# Patient Record
Sex: Male | Born: 1940 | Race: White | Hispanic: No | Marital: Married | Smoking: Never smoker
Health system: Southern US, Community
[De-identification: ages and names within clinical notes are randomized; demographics above are authoritative.]

## PROBLEM LIST (undated history)

## (undated) DIAGNOSIS — T148XXA Other injury of unspecified body region, initial encounter: Secondary | ICD-10-CM

## (undated) DIAGNOSIS — R351 Nocturia: Secondary | ICD-10-CM

## (undated) DIAGNOSIS — I4819 Other persistent atrial fibrillation: Secondary | ICD-10-CM

## (undated) DIAGNOSIS — I5032 Chronic diastolic (congestive) heart failure: Secondary | ICD-10-CM

## (undated) DIAGNOSIS — Z85831 Personal history of malignant neoplasm of soft tissue: Secondary | ICD-10-CM

## (undated) DIAGNOSIS — F32A Depression, unspecified: Secondary | ICD-10-CM

## (undated) DIAGNOSIS — E291 Testicular hypofunction: Secondary | ICD-10-CM

## (undated) DIAGNOSIS — D649 Anemia, unspecified: Secondary | ICD-10-CM

## (undated) DIAGNOSIS — G4733 Obstructive sleep apnea (adult) (pediatric): Secondary | ICD-10-CM

## (undated) DIAGNOSIS — Z8711 Personal history of peptic ulcer disease: Secondary | ICD-10-CM

## (undated) DIAGNOSIS — Z9989 Dependence on other enabling machines and devices: Secondary | ICD-10-CM

## (undated) DIAGNOSIS — M1990 Unspecified osteoarthritis, unspecified site: Secondary | ICD-10-CM

## (undated) DIAGNOSIS — Z923 Personal history of irradiation: Secondary | ICD-10-CM

## (undated) DIAGNOSIS — I4510 Unspecified right bundle-branch block: Secondary | ICD-10-CM

## (undated) DIAGNOSIS — R29898 Other symptoms and signs involving the musculoskeletal system: Secondary | ICD-10-CM

## (undated) DIAGNOSIS — F329 Major depressive disorder, single episode, unspecified: Secondary | ICD-10-CM

## (undated) DIAGNOSIS — Z9884 Bariatric surgery status: Secondary | ICD-10-CM

## (undated) DIAGNOSIS — I4892 Unspecified atrial flutter: Secondary | ICD-10-CM

## (undated) DIAGNOSIS — I10 Essential (primary) hypertension: Secondary | ICD-10-CM

## (undated) DIAGNOSIS — C61 Malignant neoplasm of prostate: Secondary | ICD-10-CM

## (undated) DIAGNOSIS — C499 Malignant neoplasm of connective and soft tissue, unspecified: Secondary | ICD-10-CM

## (undated) DIAGNOSIS — I509 Heart failure, unspecified: Secondary | ICD-10-CM

## (undated) DIAGNOSIS — I493 Ventricular premature depolarization: Secondary | ICD-10-CM

## (undated) DIAGNOSIS — Z9289 Personal history of other medical treatment: Secondary | ICD-10-CM

## (undated) DIAGNOSIS — R0600 Dyspnea, unspecified: Secondary | ICD-10-CM

## (undated) DIAGNOSIS — Z973 Presence of spectacles and contact lenses: Secondary | ICD-10-CM

## (undated) HISTORY — PX: CARDIAC CATHETERIZATION: SHX172

## (undated) HISTORY — PX: PATELLA FRACTURE SURGERY: SHX735

## (undated) HISTORY — DX: Malignant neoplasm of prostate: C61

## (undated) HISTORY — PX: FRACTURE SURGERY: SHX138

## (undated) HISTORY — DX: Personal history of other medical treatment: Z92.89

## (undated) HISTORY — PX: INGUINAL HERNIA REPAIR: SUR1180

## (undated) HISTORY — DX: Other persistent atrial fibrillation: I48.19

## (undated) HISTORY — DX: Unspecified atrial flutter: I48.92

## (undated) HISTORY — DX: Ventricular premature depolarization: I49.3

## (undated) HISTORY — PX: TONSILLECTOMY: SUR1361

## (undated) HISTORY — PX: JOINT REPLACEMENT: SHX530

## (undated) HISTORY — PX: GROIN DISSECTION: SUR421

---

## 1974-08-16 HISTORY — PX: INTESTINAL BYPASS: SHX1099

## 1975-08-17 HISTORY — PX: VENTRAL HERNIA REPAIR: SHX424

## 1984-08-16 HISTORY — PX: FOREARM FRACTURE SURGERY: SHX649

## 1991-08-17 DIAGNOSIS — Z923 Personal history of irradiation: Secondary | ICD-10-CM

## 1991-08-17 HISTORY — DX: Personal history of irradiation: Z92.3

## 1999-05-13 ENCOUNTER — Encounter: Payer: Self-pay | Admitting: Gastroenterology

## 1999-05-13 ENCOUNTER — Ambulatory Visit (HOSPITAL_COMMUNITY): Admission: RE | Admit: 1999-05-13 | Discharge: 1999-05-13 | Payer: Self-pay | Admitting: Gastroenterology

## 1999-05-25 ENCOUNTER — Ambulatory Visit (HOSPITAL_COMMUNITY): Admission: RE | Admit: 1999-05-25 | Discharge: 1999-05-25 | Payer: Self-pay | Admitting: Gastroenterology

## 1999-05-25 ENCOUNTER — Encounter: Payer: Self-pay | Admitting: Gastroenterology

## 1999-12-23 ENCOUNTER — Emergency Department (HOSPITAL_COMMUNITY): Admission: EM | Admit: 1999-12-23 | Discharge: 1999-12-24 | Payer: Self-pay | Admitting: Emergency Medicine

## 1999-12-24 ENCOUNTER — Encounter: Payer: Self-pay | Admitting: Emergency Medicine

## 2000-03-17 ENCOUNTER — Encounter: Payer: Self-pay | Admitting: *Deleted

## 2000-03-17 ENCOUNTER — Encounter: Admission: RE | Admit: 2000-03-17 | Discharge: 2000-03-17 | Payer: Self-pay

## 2000-05-27 ENCOUNTER — Encounter: Admission: RE | Admit: 2000-05-27 | Discharge: 2000-05-27 | Payer: Self-pay | Admitting: Gastroenterology

## 2000-05-27 ENCOUNTER — Encounter: Payer: Self-pay | Admitting: Gastroenterology

## 2001-01-26 ENCOUNTER — Encounter: Payer: Self-pay | Admitting: Internal Medicine

## 2001-01-26 ENCOUNTER — Encounter: Admission: RE | Admit: 2001-01-26 | Discharge: 2001-01-26 | Payer: Self-pay | Admitting: Internal Medicine

## 2001-02-07 ENCOUNTER — Encounter: Admission: RE | Admit: 2001-02-07 | Discharge: 2001-02-07 | Payer: Self-pay | Admitting: Internal Medicine

## 2001-02-07 ENCOUNTER — Encounter: Payer: Self-pay | Admitting: Internal Medicine

## 2001-05-10 ENCOUNTER — Emergency Department (HOSPITAL_COMMUNITY): Admission: EM | Admit: 2001-05-10 | Discharge: 2001-05-10 | Payer: Self-pay | Admitting: Emergency Medicine

## 2001-05-10 ENCOUNTER — Encounter: Payer: Self-pay | Admitting: Emergency Medicine

## 2001-07-14 ENCOUNTER — Encounter: Admission: RE | Admit: 2001-07-14 | Discharge: 2001-07-14 | Payer: Self-pay | Admitting: General Surgery

## 2001-07-14 ENCOUNTER — Encounter: Payer: Self-pay | Admitting: General Surgery

## 2001-08-14 ENCOUNTER — Encounter: Payer: Self-pay | Admitting: General Surgery

## 2001-08-14 ENCOUNTER — Ambulatory Visit (HOSPITAL_COMMUNITY): Admission: RE | Admit: 2001-08-14 | Discharge: 2001-08-14 | Payer: Self-pay | Admitting: General Surgery

## 2002-07-02 ENCOUNTER — Ambulatory Visit (HOSPITAL_COMMUNITY): Admission: RE | Admit: 2002-07-02 | Discharge: 2002-07-02 | Payer: Self-pay | Admitting: Gastroenterology

## 2002-07-03 ENCOUNTER — Ambulatory Visit (HOSPITAL_COMMUNITY): Admission: RE | Admit: 2002-07-03 | Discharge: 2002-07-03 | Payer: Self-pay | Admitting: Gastroenterology

## 2002-07-03 HISTORY — PX: COLONOSCOPY: SHX174

## 2002-09-24 ENCOUNTER — Encounter: Payer: Self-pay | Admitting: Internal Medicine

## 2002-09-24 ENCOUNTER — Encounter: Admission: RE | Admit: 2002-09-24 | Discharge: 2002-09-24 | Payer: Self-pay | Admitting: Internal Medicine

## 2002-09-25 ENCOUNTER — Encounter: Payer: Self-pay | Admitting: Internal Medicine

## 2002-09-25 ENCOUNTER — Encounter: Admission: RE | Admit: 2002-09-25 | Discharge: 2002-09-25 | Payer: Self-pay | Admitting: Internal Medicine

## 2003-04-20 ENCOUNTER — Encounter: Payer: Self-pay | Admitting: Emergency Medicine

## 2003-04-20 ENCOUNTER — Emergency Department (HOSPITAL_COMMUNITY): Admission: EM | Admit: 2003-04-20 | Discharge: 2003-04-20 | Payer: Self-pay | Admitting: Emergency Medicine

## 2003-08-08 ENCOUNTER — Encounter (INDEPENDENT_AMBULATORY_CARE_PROVIDER_SITE_OTHER): Payer: Self-pay | Admitting: Cardiology

## 2003-08-08 ENCOUNTER — Inpatient Hospital Stay (HOSPITAL_COMMUNITY): Admission: RE | Admit: 2003-08-08 | Discharge: 2003-08-13 | Payer: Self-pay | Admitting: Internal Medicine

## 2003-08-08 HISTORY — PX: TRANSTHORACIC ECHOCARDIOGRAM: SHX275

## 2007-02-23 ENCOUNTER — Encounter: Admission: RE | Admit: 2007-02-23 | Discharge: 2007-02-23 | Payer: Self-pay | Admitting: Internal Medicine

## 2011-01-01 NOTE — Discharge Summary (Signed)
NAME:  Eric Mayer, Eric Mayer                      ACCOUNT NO.:  [id number]   MEDICAL RECORD NO.:  [id number]                   PATIENT TYPE:  INP   LOCATION:  3735                                 FACILITY:  [facility]   PHYSICIAN:  Thora Lance, M.D.               DATE OF BIRTH:  [birth year]   DATE OF ADMISSION:  08/08/2003  DATE OF DISCHARGE:  08/13/2003                                 DISCHARGE SUMMARY   REASON FOR ADMISSION:  This 70 year old white male with a history of  hypogonadism, hypertension and depression presented with left chest and neck  pain of a one hour duration, with recurrence x3 on the day of admission.   SIGNIFICANT PHYSICAL FINDINGS:  Blood pressure 130/60, heart rate 150.  Lungs clear.  Heart irregular and tachycardic.  Abdomen benign.  Extremities  without edema.   LABORATORY DATA:  CBC:  WBC 6300, hemoglobin 12.2, platelet count of  233,000.  PT 14.1.  Chemistries - sodium 140, potassium 5.1, chloride 105,  bicarbonate 29, glucose 78, BUN 14, creatinine 0.8, calcium 9.0.  CK 34, CK-  MB 1.2, troponin I was 0.02.  TSH normal at 1.57.  Chest x-ray - no acute  disease, cardiomegaly.  EKG - atrial fibrillation with rapid ventricular  response.   HOSPITAL COURSE:  The patient was admitted with new onset atrial  fibrillation.  He was placed on heparin and his rate was controlled with  diltiazem.  He ruled out for myocardial infarction by cardiac enzymes.  Cardiology was consulted.  A 2D echocardiogram was done and showed normal  systolic function.  Ejection fraction estimated between 55 and 65%, no  regional wall motion abnormalities, moderate LVH, mild mitral regurgitation,  moderate LAE, and mild pulmonic stenosis.  The patient converted to normal  sinus rhythm by August 09, 2003.  He was switched from IV to p.o. Cardizem  and warfarin was begun.  A Cardiolite was recommended by Dr. Amil Amen.  On August 11, 2003 the  patient did have an episode of chest pain  radiating to the left shoulder and  neck.  EKG suggested mild ST elevation.  The patient's Cardiolite was  canceled and a cardiac catheterization was scheduled for August 12, 2003.  Coumadin was discontinued temporarily.  The patient was continued on Lovenox  and aspirin.  Cardiac catheterization on August 12, 2003 showed normal  coronary arteries, normal wall motion, and no significant abnormality.  The  patient remained in normal sinus rhythm at discharge.   DISCHARGE DIAGNOSES:  1. Chest pain.  2. Atrial fibrillation.  3. Hypertension.  4. Hypogonadism.  5. Depression.  6. History of recurrent abdominal pain, nausea and vomiting.  7. Morbid obesity.  8. Degenerative joint disease.   PROCEDURES:  1. Echocardiogram.  2. Cardiac catheterization.   DISCHARGE MEDICATIONS:  1. Diltiazem SR 240 mg one daily.  2. Lovenox 150 mg under skin q.12h.  3. Coumadin 7.5 mg q.p.m.  4. Wellbutrin XL 300 mg p.o. daily.  5. Lexapro 10 mg p.o. daily.  6. Diovan-HCT 150/25 mg daily.  7. AndroGel 5 grams two packets daily.  8. Viagra p.r.n.  9. Norvasc and aspirin were discontinued.   ACTIVITY:  No restrictions.   DIET:  Low sodium.   FOLLOW UP:  Check PT, INR and CBC in two days.  Follow up in two weeks with  Dr. Valentina Lucks.  Follow up with Dr. Mayford Knife in two to three weeks.                                                Thora Lance, M.D.    Delorse Limber  D:  08/23/2003  T:  08/24/2003  Job:  [id number]   cc:   Armanda Magic, M.D.  [address]  [place], [place] [address]  Fax: [phone]

## 2011-01-01 NOTE — Consult Note (Signed)
NAME:  Eric Mayer, Eric Mayer                      ACCOUNT NO.:  [id number]   MEDICAL RECORD NO.:  [id number]                   PATIENT TYPE:  INP   LOCATION:  3735                                 FACILITY:  [facility]   PHYSICIAN:  Armanda Magic, M.D.                  DATE OF BIRTH:  19-Nov-1940   DATE OF CONSULTATION:  08/08/2003  DATE OF DISCHARGE:                                   CONSULTATION   CHIEF COMPLAINT:  New onset atrial fibrillation and chest pain.   HISTORY:  This is a very pleasant 70 year old male who is followed by Dr.  Kirby Funk.  He has a history of hypertension, hypogonadism, and  depression.  Basically was in his usual state of health until about a week  ago when he started developing severe jaw pain radiating to his neck and  shoulders and then to his knuckles.  It was constant for several days.  He  finally went to the walk-in clinic and was given some pain medication with  no significant improvement, although it would go off an on in severity.  Last evening he developed left-sided neck pain and pain in to his jaw, also  into his chest that lasted about an hour and then improved and went  completely away.  Then it came back at 1 a.m., and then it kind of waxed and  waned all night.  Currently he describes the pressure as a 1/10 sensation.  He denies any shortness of breath or nausea, vomiting, or diaphoresis.  No  lower extremity edema.  He has had some dyspnea on exertion.  He has also  had a low-grade headache.   PAST MEDICAL HISTORY:  1. Hypertension.  2. He had a cough on the ACE INHIBITOR and is now off the ACE inhibitor.  3. Hypogonadism.  4. Depression.  5. History of hematochezia.  Colonoscopy and barium enema unremarkable in     2003 and 2004.  6. History of abdominal pain, nausea and vomiting.  Ultrasound in 2001     showed probable gallbladder polyps and diffuse fatty infiltrates in the     liver.  7. History of morbid obesity status post intestinal  bypass in 1976.  8. History of peptic ulcer disease related to aspirin use in early 1980s.  9. History of DJD of the left hip and possibly also right hip, back, and     shoulders.  10.      History of anemia in 1980 due to iron deficiency.  GI workup     negative.  11.      Chest pain history with normal stress test in November 2001.   PAST SURGICAL HISTORY:  1. Left groin sarcoma surgery in 1992.  2. Fractured left knee cap in 1995, repaired by Dr. Jerl Santos.  3. Intestinal bypass in 1976.  4. Fractured left arm surgery.   ALLERGIES:  ACE INHIBITOR, cough.   CURRENT  MEDICATIONS:  1. Wellbutrin XL 300 mg a day.  2. Lexapro 10 mg a day.  3. Norvasc 5 mg a day.  4. Diovan HCT 160/25 mg a day.  5. AndroGel 5 mg 2 packets a day which is a gel.  6. Viagra 50 mg p.r.n.   FAMILY HISTORY:  His father is 55 with heart problems.  His mother is 34  with history of hypertension, CVA, and depression.  He has a sister with  depression who died of lung cancer and a son who died in an MVA in Jan 24, 1998.   SOCIAL HISTORY:  He is married.  He denies tobacco use.  He denies alcohol  use.  He gets no formal exercise.  He had one son who is deceased at 32.  He  is a Runner, broadcasting/film/video at [employer].   REVIEW OF SYSTEMS:  Includes bilateral shoulder pain in the past, long  history of intermittent gas and occasional diarrhea.  Also low-grade  headaches.   PHYSICAL EXAMINATION:  VITAL SIGNS:  Blood pressure 130/60, weight 268  pounds.  Heart rate is irregular at 150 beats per minute.  GENERAL:  Well-developed, well-nourished, obese white male in no acute  distress.  HEENT:  Benign.  NECK:  Supple without lymphadenopathy.  Carotid upstrokes +2 bilaterally, no  bruits.  LUNGS:  Clear to auscultation throughout.  HEART:  Irregularly irregular and rapid.  Normal S1, S2.  No murmurs, rubs,  or gallops.  ABDOMEN:  Soft, nontender, nondistended, with active bowel sounds.  No  hepatosplenomegaly.   EXTREMITIES:  +1 posterior tibial pulses bilaterally, no edema.   LABORATORY DATA:  EKG shows atrial fibrillation with rapid ventricular  response at 147 beats per minute with occasional PVCs versus aberrantly  conducted QRS complexes.  There are no acute ST-T wave abnormalities.   IMPRESSION:  1. New onset rapid atrial fibrillation of unknown duration.  2. Chest pain of unclear etiology, possibly could be related to underlying     angina versus atrial fibrillation.  3. Hypertension with good blood pressure control.  4. Depression.   PLAN:  1. Agree with admission to the hospital for telemetry monitoring.  2. Agree with starting IV Cardizem drip and IV heparin.  3. Will need to be started on p.o. Coumadin since we do not know the     duration of  his atrial fibrillation.  He will need four weeks of     Coumadin therapy with an INR greater then 2 prior to DC cardioversion.  4. Will continue all medications for blood pressure control.  5. If heart rate cannot get under control with IV Cardizem, would start     digoxin 0.5 mg IV now and then 0.25 mg IV q.6h. x 2 doses for a total of     1 mg IV load and then start 0.25 mg p.o. daily.  6. Would check a TSH.  7. Would get a 2-D echocardiogram to rule out structural heart disease.  8. He will also need a stress Cardiolite study prior to discharge.                                               Armanda Magic, M.D.    TT/MEDQ  D:  08/08/2003  T:  08/09/2003  Job:  [id number]   cc:   Thora Lance, M.D.  [phone]  [address]  [place]  [place] [address]  Fax: [phone]

## 2011-01-01 NOTE — Op Note (Signed)
   NAME:  Eric Mayer, Eric Mayer                        ACCOUNT NO.:  [id number]   MEDICAL RECORD NO.:  [id number]                   PATIENT TYPE:  AMB   LOCATION:  ENDO                                 FACILITY:  [facility]   PHYSICIAN:  Danise Edge, M.D.                DATE OF BIRTH:  1940/09/30   DATE OF PROCEDURE:  07/02/2002  DATE OF DISCHARGE:                                 OPERATIVE REPORT   PROCEDURE:  Cancelled colonoscopy.   INDICATIONS:  The patient is a 70 year old male born 03-08-2041.  The patient  was scheduled to undergo a diagnostic colonoscopy to evaluate hematochezia.  The patient has undergone intestinal bypass surgery for obesity  approximately 30 years ago.   To prepare for his colonoscopy the patient consumed a combination of Miralax  with Gatorade.  He reports poor stool output despite the colon prep.   DESCRIPTION OF PROCEDURE:  Anal inspection was normal.  Digital rectal  examination was normal.  The patient was given 50 mg Demerol and 10 mg  Versed as conscious sedation for the procedure.  The Olympus pediatric video  colonoscope was introduced into the rectum and advanced to the hepatic  flexure.  Due to the presence of a large amount of solid and liquid stool in  the rectum and throughout the colon, a diagnostic colonoscopy was not  performed.   PLAN:  The patient will remain on a clear liquid diet and receive further  preparation for his rescheduled colonoscopy for tomorrow, which would be  07/03/02.                                               Danise Edge, M.D.    MJ/MEDQ  D:  07/02/2002  T:  07/02/2002  Job:  [id number]   cc:   Angelia Mould. Derrell Lolling, M.D.  [address]  [place]  [place] [address]  Fax: [phone]   Thora Lance, M.D.  [address]  [place]  [place] [address]  Fax: [phone]

## 2011-01-01 NOTE — Op Note (Signed)
NAME:  Eric Mayer, Eric Mayer                        ACCOUNT NO.:  [id number]   MEDICAL RECORD NO.:  [id number]                   PATIENT TYPE:  AMB   LOCATION:  ENDO                                 FACILITY:  [facility]   PHYSICIAN:  Danise Edge, M.D.                DATE OF BIRTH:  03-Jul-1941   DATE OF PROCEDURE:  07/03/2002  DATE OF DISCHARGE:                                 OPERATIVE REPORT   REFERRED BY:  Thora Lance, M.D.   PROCEDURE:  Colonoscopy to the mid-transverse colon.   ENDOSCOPIST:  Danise Edge, M.D.   INDICATIONS FOR PROCEDURE:  The patient is a 70 year old male born on 16-Oct-1940.  The patient has undergone an intestinal bypass surgery for  obesity in 1974.  He is scheduled to undergo a screening colonoscopy with  polypectomy to prevent colon cancer.   PREMEDICATION:  Versed 10 mg, fentanyl 50 mcg.   ENDOSCOPE:  Olympus adult colonoscope.   DESCRIPTION OF PROCEDURE:  After obtaining an informed consent, the patient  was placed in the left lateral decubitus position.  I administered  intravenous Versed and intravenous fentanyl, to achieve conscious sedation  for the procedure.  The patient's blood pressure, oxygen saturation and  cardiac rhythm were monitored throughout the procedure and documented in the  medical record.  An anal inspection was normal.  A digital rectal exam was  normal.  The Olympus adult colonoscope was introduced into the rectum, and  advanced to approximately the mid-transverse colon.  Due to colonic loop  formation, I was unable to advance the endoscope any further to complete a  full colonoscopy.  Colonic preparation for the exam today was satisfactory.  RECTUM:  The rectal mucosa appears normal.  There is a moderate-sized  internal hemorrhoid with a blood clot, indicating recent hemorrhoidal  bleeding.  SIGMOID COLON AND DESCENDING COLON:  Normal.  SPLENIC FLEXURE:  Normal.  TRANSVERSE COLON:  Normal, mid-transverse colon to the  distal transverse  colon.  The proximal transverse colon, hepatic flexure, ascending colon, cecum and  ileocecal valve were not examined.    ASSESSMENT:  Normal proctocolonoscopy to the mid-transverse colon.  I was  unable to complete a full colonoscopy, due to colonic loop formation.  Moderate-sized internal hemorrhoid with blood clot indicating a recent  bleeding.   RECOMMENDATIONS:  Schedule air contrast barium enema if clinically  indicated.                                                 Danise Edge, M.D.    MJ/MEDQ  D:  07/03/2002  T:  07/03/2002  Job:  [id number]   cc:   Thora Lance, M.D.  [address]  [place]  [place] [address]  Fax: [phone]  Claud Kelp, M.D.

## 2013-04-27 ENCOUNTER — Emergency Department (HOSPITAL_COMMUNITY): Payer: Medicare Other

## 2013-04-27 ENCOUNTER — Emergency Department (HOSPITAL_COMMUNITY)
Admission: EM | Admit: 2013-04-27 | Discharge: 2013-04-27 | Disposition: A | Discharge status: Home / Self Care | Payer: Medicare Other | Attending: Emergency Medicine | Admitting: Emergency Medicine

## 2013-04-27 ENCOUNTER — Encounter (HOSPITAL_COMMUNITY): Payer: Self-pay | Admitting: Emergency Medicine

## 2013-04-27 DIAGNOSIS — S82109A Unspecified fracture of upper end of unspecified tibia, initial encounter for closed fracture: Secondary | ICD-10-CM | POA: Insufficient documentation

## 2013-04-27 DIAGNOSIS — Y9289 Other specified places as the place of occurrence of the external cause: Secondary | ICD-10-CM | POA: Insufficient documentation

## 2013-04-27 DIAGNOSIS — Z9889 Other specified postprocedural states: Secondary | ICD-10-CM | POA: Insufficient documentation

## 2013-04-27 DIAGNOSIS — Z79899 Other long term (current) drug therapy: Secondary | ICD-10-CM | POA: Insufficient documentation

## 2013-04-27 DIAGNOSIS — I4891 Unspecified atrial fibrillation: Secondary | ICD-10-CM | POA: Insufficient documentation

## 2013-04-27 DIAGNOSIS — Z8589 Personal history of malignant neoplasm of other organs and systems: Secondary | ICD-10-CM | POA: Insufficient documentation

## 2013-04-27 DIAGNOSIS — S82102A Unspecified fracture of upper end of left tibia, initial encounter for closed fracture: Secondary | ICD-10-CM

## 2013-04-27 DIAGNOSIS — I10 Essential (primary) hypertension: Secondary | ICD-10-CM | POA: Insufficient documentation

## 2013-04-27 DIAGNOSIS — Y9389 Activity, other specified: Secondary | ICD-10-CM | POA: Insufficient documentation

## 2013-04-27 DIAGNOSIS — Y990 Civilian activity done for income or pay: Secondary | ICD-10-CM | POA: Insufficient documentation

## 2013-04-27 DIAGNOSIS — Z7902 Long term (current) use of antithrombotics/antiplatelets: Secondary | ICD-10-CM | POA: Insufficient documentation

## 2013-04-27 DIAGNOSIS — S82409A Unspecified fracture of shaft of unspecified fibula, initial encounter for closed fracture: Secondary | ICD-10-CM | POA: Insufficient documentation

## 2013-04-27 DIAGNOSIS — R209 Unspecified disturbances of skin sensation: Secondary | ICD-10-CM | POA: Insufficient documentation

## 2013-04-27 DIAGNOSIS — W010XXA Fall on same level from slipping, tripping and stumbling without subsequent striking against object, initial encounter: Secondary | ICD-10-CM | POA: Insufficient documentation

## 2013-04-27 DIAGNOSIS — Z8781 Personal history of (healed) traumatic fracture: Secondary | ICD-10-CM | POA: Insufficient documentation

## 2013-04-27 HISTORY — DX: Essential (primary) hypertension: I10

## 2013-04-27 MED ORDER — MORPHINE SULFATE 2 MG/ML IJ SOLN
2.0000 mg | Freq: Once | INTRAMUSCULAR | Status: AC
  Administered 2013-04-27: 2 mg via INTRAVENOUS
  Filled 2013-04-27: qty 1

## 2013-04-27 MED ORDER — HYDROCODONE-ACETAMINOPHEN 5-325 MG PO TABS: 1.0000 | ORAL_TABLET | ORAL | Status: DC | PRN

## 2013-04-27 MED ORDER — MORPHINE SULFATE 4 MG/ML IJ SOLN: 4.0000 mg | Freq: Once | INTRAMUSCULAR | Status: DC

## 2013-04-27 NOTE — ED Notes (Signed)
Pt is trying to phone a friend to pick him up

## 2013-04-27 NOTE — ED Notes (Signed)
Pt from home vis EMS. Pt was walking outside, tripped and fell onto L leg and L hip. Pt denies hitting head or LOC. Pt reports that he had severe pain to shin area. Pt adds that he has had multiple injuries to L leg including sx. Pt received 50 mcg Fentanyl en route and 4 Zofran. Pt A&O and in NAD. Pt is on Pradaxa for a fib.

## 2013-04-27 NOTE — ED Notes (Signed)
Pt is still trying to work out a ride home. Pt only has credit card and was told that taxi will only take cash.

## 2013-04-27 NOTE — ED Notes (Signed)
Bed: WA20 Expected date:  Expected time:  Means of arrival:  Comments: ems- broken tib/fib

## 2013-04-27 NOTE — ED Notes (Signed)
EDP at bedside  

## 2013-04-27 NOTE — ED Notes (Signed)
Pt was working in the yard, went into the woods where he tripped injuring his L leg. Pt is A&O, has no other c/o and in NAD

## 2013-04-27 NOTE — ED Provider Notes (Signed)
CSN: [id number]     Arrival date & time 04/27/13  1223 History   First MD Initiated Contact with Patient 04/27/13 1227     Chief Complaint  Patient presents with  . Leg Injury   (Consider location/radiation/quality/duration/timing/severity/associated sxs/prior Treatment) Patient is a 72 y.o. male presenting with leg pain. The history is provided by the patient. No language interpreter was used.  Leg Pain Location:  Leg Injury: yes   Mechanism of injury: fall   Fall:    Fall occurred:  Tripped (outside while doing yard work)   Height of fall:  [employer] of impact:  Unable to specify   Entrapped after fall: no   Leg location:  L lower leg Pain details:    Quality:  Aching   Radiates to:  Does not radiate   Severity:  Moderate   Onset quality:  Sudden   Duration: mins.   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 ) Worsened by:  Bearing weight Ineffective treatments:  Immobilization Associated symptoms: numbness (chronic, unchanged) and swelling   Associated symptoms: no back pain, no fatigue, no fever and no neck pain   Risk factors: frequent fractures   Risk factors: no concern for non-accidental trauma and no known bone disorder     Past Medical History  Diagnosis Date  . A-fib   . Cancer     sarcoma groin  . Hypertension    Past Surgical History  Procedure Laterality Date  . Intestinal bypass    . Knee arthroplasty Left   . External fixation arm Left   . Patella fracture surgery    . Tonsillectomy    . Hernia repair    . Abdominal surgery     No family history on file. History  Substance Use Topics  . Smoking status: Never Smoker   . Smokeless tobacco: Not on file  . Alcohol Use: No    Review of  Systems  Constitutional: Negative for fever, activity change, appetite change and fatigue.  HENT: Negative for congestion, facial swelling, rhinorrhea, trouble swallowing and neck pain.   Eyes: Negative for photophobia and pain.  Respiratory: Negative for cough, chest tightness and shortness of breath.   Cardiovascular: Negative for chest pain and leg swelling.  Gastrointestinal: Negative for nausea, vomiting, abdominal pain, diarrhea and constipation.  Endocrine: Negative for polydipsia and polyuria.  Genitourinary: Negative for dysuria, urgency, decreased urine volume and difficulty urinating.  Musculoskeletal: Negative for back pain and gait problem.  Skin: Negative for color change, rash and wound.  Allergic/Immunologic: Negative for immunocompromised state.  Neurological: Negative for dizziness, facial asymmetry, speech difficulty, weakness, numbness and headaches.  Psychiatric/Behavioral: Negative for confusion, decreased concentration and agitation.    Allergies  Review of patient's allergies indicates no known allergies.  Home Medications   Current Outpatient Rx  Name  Route  Sig  Dispense  Refill  . BUPROPION HBR ER PO   Oral   Take 1 tablet by mouth daily.          . Coenzyme Q10 (COQ10 PO)   Oral   Take 1 tablet by mouth every evening.         . dabigatran (PRADAXA) 150 MG CAPS capsule   Oral  Take 150 mg by mouth every 12 (twelve) hours.         [name] diltiazem (TIAZAC) 240 MG 24 hr capsule   Oral   Take 240 mg by mouth daily.         [name] escitalopram (LEXAPRO) 10 MG tablet   Oral   Take 10 mg by mouth daily.         [name] METOPROLOL TARTRATE PO   Oral   Take 1 tablet by mouth 2 (two) times daily.         . Multiple Vitamin (MULTIVITAMIN WITH MINERALS) TABS tablet   Oral   Take 1 tablet by mouth daily.         [name] neomycin-polymyxin-hydrocortisone (CORTISPORIN) 3.5-10000-1 otic suspension   Both Ears   Place 3 drops into both ears daily as needed  (for ear infections).         . Testosterone 12.5 MG/ACT (1%) GEL   Transdermal   Place 6 Squirts onto the skin every morning.          . valsartan-hydrochlorothiazide (DIOVAN-HCT) 160-25 MG per tablet   Oral   Take 1 tablet by mouth daily.         [name] HYDROcodone-acetaminophen (NORCO/VICODIN) 5-325 MG per tablet   Oral   Take 1 tablet by mouth every 4 (four) hours as needed for pain.   15 tablet   0    BP 135/79  Pulse 70  Temp(Src) 97.9 F (36.6 C) (Oral)  Resp 16  SpO2 95% Physical Exam  Constitutional: He is oriented to person, place, and time. He appears well-developed and well-nourished. No distress.  HENT:  Head: Normocephalic and atraumatic.  Mouth/Throat: No oropharyngeal exudate.  Eyes: Pupils are equal, round, and reactive to light.  Neck: Normal range of motion. Neck supple.  Cardiovascular: Normal rate, regular rhythm and normal heart sounds.  Exam reveals no gallop and no friction rub.   No murmur heard. Pulmonary/Chest: Effort normal and breath sounds normal. No respiratory distress. He has no wheezes. He has no rales.  Abdominal: Soft. Bowel sounds are normal. He exhibits no distension and no mass. There is no tenderness. There is no rebound and no guarding.  Musculoskeletal: Normal range of motion. He exhibits no edema and no tenderness.       Legs: Neurological: He is alert and oriented to person, place, and time.  Skin: Skin is warm and dry.  Psychiatric: He has a normal mood and affect.    ED Course  Procedures (including critical care time) Labs Review Labs Reviewed - No data to display Imaging Review Dg Knee 2 Views Left  04/27/2013   CLINICAL DATA:  Proximal lower leg pain post fall, status post patellar fracture the  EXAM: LEFT KNEE - 1-2 VIEW  COMPARISON:  None.  FINDINGS: Two views of the left knee submitted. Study is limited by diffuse osteopenia. There are fixation wires within patella. There is interruption of superior aspect of the  wires. No definite patellar fracture is noted. There is deformity of proximal tibial and fibular probable from prior injury. There is a acute nondisplaced fracture in proximal tibial metaphysis. There is also probable nondisplaced fracture in proximal fibula.  IMPRESSION: Study is limited by diffuse osteopenia. There are fixation wires within patella. There is interruption of superior aspect of the wires. No definite patellar fracture is noted. There is deformity of proximal tibial and fibular probable from prior injury. There is a acute nondisplaced fracture in proximal tibial metaphysis.  There is also probable nondisplaced fracture in proximal fibula.   Electronically Signed   By: Natasha Mead   On: 04/27/2013 13:40   Dg Tibia/fibula Left  04/27/2013   *RADIOLOGY REPORT*  Clinical Data: Pain post trauma  LEFT TIBIA AND FIBULA - 2 VIEW  Comparison: None.  Findings:  Frontal and lateral views were obtained.  There is evidence of old fractures of the proximal tibia and fibula with remodelling an exostosis formation.  There is evidence of prior trauma involving the patella with wire and fixation.  Patella is positioned somewhat inferiorly, suggesting chronic injury to the quadriceps tendon.  There is no acute fracture or dislocation.  There is no abnormal periosteal reaction.  Impression:  Evidence of prior trauma to the proximal tibia and fibula as well as patella.  No acute fracture or dislocation is seen.  Patella is inferiorly positioned, suggesting prior injury to the quadriceps tendon.   Original Report Authenticated By: Bretta Bang, M.D.    DG Knee 2 Views Left (Final result)  Result time: 04/27/13 13:40:30    Final result by Rad Results In Interface (04/27/13 13:40:30)    Narrative:   CLINICAL DATA: Proximal lower leg pain post fall, status post patellar fracture the  EXAM: LEFT KNEE - 1-2 VIEW  COMPARISON: None.  FINDINGS: Two views of the left knee submitted. Study is limited by  diffuse osteopenia. There are fixation wires within patella. There is interruption of superior aspect of the wires. No definite patellar fracture is noted. There is deformity of proximal tibial and fibular probable from prior injury. There is a acute nondisplaced fracture in proximal tibial metaphysis. There is also probable nondisplaced fracture in proximal fibula.  IMPRESSION: Study is limited by diffuse osteopenia. There are fixation wires within patella. There is interruption of superior aspect of the wires. No definite patellar fracture is noted. There is deformity of proximal tibial and fibular probable from prior injury. There is a acute nondisplaced fracture in proximal tibial metaphysis. There is also probable nondisplaced fracture in proximal fibula.   Electronically Signed By: Natasha Mead On: 04/27/2013 13:40             DG Tibia/Fibula Left (Final result)  Result time: 04/27/13 13:43:32    Final result by Rad Results In Interface (04/27/13 13:43:32)    Narrative:   *RADIOLOGY REPORT*  Clinical Data: Pain post trauma  LEFT TIBIA AND FIBULA - 2 VIEW  Comparison: None.  Findings: Frontal and lateral views were obtained. There is evidence of old fractures of the proximal tibia and fibula with remodelling an exostosis formation. There is evidence of prior trauma involving the patella with wire and fixation. Patella is positioned somewhat inferiorly, suggesting chronic injury to the quadriceps tendon.  There is no acute fracture or dislocation. There is no abnormal periosteal reaction.  Impression: Evidence of prior trauma to the proximal tibia and fibula as well as patella. No acute fracture or dislocation is seen. Patella is inferiorly positioned, suggesting prior injury to the quadriceps tendon.   Original Report Authenticated By: Bretta Bang, M.D.     MDM   1. Fracture of proximal end of tibia, left, closed, initial encounter    Pt is a 72  y.o. male with Pmhx as above who presents with pain to anterior 2/3's L LE after mechanical fall just PTA.  On PE, VSS, pt in NAD, has ecchymosis over LLE.  NVI distally. No other injuries, VSS, pt in NAD. XR knee/tibfib ordered, shows acute non-displaced  fx of prox tibial metaphysis, possible fx of prox fibula.  Of note, this was not reported on XR tib/fib, but on XR of ap/lat knee and have spoken w/ Dr. Ruffin Frederick about findings.  Dr. Mack Hook w/ [facility] consulted, have discussed characteristics of fracture & prior injuries, he recommends long knee immobilizer, crutches and clinic f/u on Monday with him in office.  Norco given for pain. Return precautions given for new or worsening symptoms including worsening pain, swelling, numbness, weakness worse from baseline.   1. Fracture of proximal end of tibia, left, closed, initial encounter         Shanna Cisco, MD 04/27/13 1610

## 2014-03-28 ENCOUNTER — Other Ambulatory Visit: Payer: Self-pay | Admitting: Gastroenterology

## 2014-03-28 DIAGNOSIS — Z139 Encounter for screening, unspecified: Secondary | ICD-10-CM

## 2014-04-10 ENCOUNTER — Ambulatory Visit
Admission: RE | Admit: 2014-04-10 | Discharge: 2014-04-10 | Disposition: A | Discharge status: Home / Self Care | Payer: BC Managed Care – PPO | Source: Ambulatory Visit | Attending: Gastroenterology | Admitting: Gastroenterology

## 2014-04-10 DIAGNOSIS — Z139 Encounter for screening, unspecified: Secondary | ICD-10-CM

## 2014-06-16 HISTORY — PX: PROSTATE BIOPSY: SHX241

## 2014-07-31 ENCOUNTER — Encounter: Payer: Self-pay | Admitting: *Deleted

## 2014-08-01 ENCOUNTER — Ambulatory Visit: Payer: BC Managed Care – PPO

## 2014-08-01 ENCOUNTER — Ambulatory Visit
Admission: RE | Admit: 2014-08-01 | Payer: BC Managed Care – PPO | Source: Ambulatory Visit | Admitting: Radiation Oncology

## 2014-08-14 ENCOUNTER — Other Ambulatory Visit: Payer: Self-pay | Admitting: Internal Medicine

## 2014-08-14 DIAGNOSIS — E278 Other specified disorders of adrenal gland: Secondary | ICD-10-CM

## 2014-08-14 DIAGNOSIS — E279 Disorder of adrenal gland, unspecified: Principal | ICD-10-CM

## 2014-08-14 NOTE — Progress Notes (Signed)
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 Laurann Montana  Biopsies of Prostate(if applicable) revealed:    Past/Anticipated interventions by urology, if any:Dr. Carolan Clines : Biopsy of the Prostate  Past/Anticipated interventions by medical oncology, if any: no  Weight changes, if any: no  Bowel/Bladder complaints, if any: IPSS 8, weak stream, some freq/intermittency/urgency/straining  Nausea/Vomiting, if any: no  Pain issues, if any: some right knee pain due to injury, wears knee brace  SAFETY ISSUES:  Prior radiation? YES, left groin for sarcoma, tx at [facility] with chemo/xrt/surgery, nerve damage in upper left leg leads to falls/injuries  Pacemaker/ICD? No  Possible current pregnancy? N/A  Is the patient on methotrexate? No  Current Complaints / other details:  Married, retired Public relations account executive  Hx of ED and hypogonadism- was using Androgel, but now using vacuum pump.  Penile injections. Hx of depression - Wellbutrin and Lexapro

## 2014-08-20 ENCOUNTER — Ambulatory Visit
Admission: RE | Admit: 2014-08-20 | Discharge: 2014-08-20 | Disposition: A | Discharge status: Home / Self Care | Payer: Medicare Other | Source: Ambulatory Visit | Attending: Radiation Oncology | Admitting: Radiation Oncology

## 2014-08-20 ENCOUNTER — Encounter: Payer: Self-pay | Admitting: Radiation Oncology

## 2014-08-20 ENCOUNTER — Telehealth: Payer: Self-pay | Admitting: *Deleted

## 2014-08-20 VITALS — BP 137/68 | HR 65 | Temp 98.9°F | Resp 22 | Ht 68.5 in | Wt 291.3 lb

## 2014-08-20 DIAGNOSIS — N529 Male erectile dysfunction, unspecified: Secondary | ICD-10-CM | POA: Diagnosis not present

## 2014-08-20 DIAGNOSIS — E291 Testicular hypofunction: Secondary | ICD-10-CM | POA: Diagnosis not present

## 2014-08-20 DIAGNOSIS — C61 Malignant neoplasm of prostate: Secondary | ICD-10-CM | POA: Insufficient documentation

## 2014-08-20 DIAGNOSIS — Z9221 Personal history of antineoplastic chemotherapy: Secondary | ICD-10-CM | POA: Diagnosis not present

## 2014-08-20 DIAGNOSIS — I10 Essential (primary) hypertension: Secondary | ICD-10-CM | POA: Insufficient documentation

## 2014-08-20 DIAGNOSIS — Z923 Personal history of irradiation: Secondary | ICD-10-CM | POA: Insufficient documentation

## 2014-08-20 DIAGNOSIS — Z510 Encounter for antineoplastic radiation therapy: Secondary | ICD-10-CM | POA: Diagnosis not present

## 2014-08-20 DIAGNOSIS — Z9884 Bariatric surgery status: Secondary | ICD-10-CM | POA: Diagnosis not present

## 2014-08-20 DIAGNOSIS — I4891 Unspecified atrial fibrillation: Secondary | ICD-10-CM | POA: Insufficient documentation

## 2014-08-20 HISTORY — DX: Personal history of irradiation: Z92.3

## 2014-08-20 NOTE — Progress Notes (Signed)
Please see the Nurse Progress Note in the MD Initial Consult Encounter for this patient. 

## 2014-08-20 NOTE — Progress Notes (Addendum)
[facility] NEW PATIENT EVALUATION  Name: Eric Nees MRN: [id number]  Date:   08/20/2014           DOB: 1941-04-13  Status: outpatient   CC: No primary care provider on file.  Ailene Rud, * Dr. 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 male who is seen today through the courtesy of Dr. Gaynelle Arabian for evaluation of his stageT1c favorable to early intermediate risk adenocarcinoma prostate.  I understand that he was found by Dr. Laurann Montana to have a PSA of 5.28 back in 2014.  He was seen by Dr. Gaynelle Arabian who repeated his PSA on 06/20/2013 which was 3.09.  He had been on AndroGel for hypergonadism.  Dr. Gaynelle Arabian obtained a 4K score which indicated a 48% chance that a biopsy would find a high-grade prostate cancer.  He underwent ultrasound-guided biopsies on 07/08/2014.  He was found have Gleason 7 (3+4) involving 5% of one core from the left mid gland.  He also had Gleason 6 (3+3) involving 10% of one core from the right lateral mid gland, 80% of one core from the right apex, and 20% of one core from the left lateral mid gland.  His gland volume was 38 mL.  He was seen by Dr. Alinda Money earlier today, and he did not feel that he would be a good surgical candidate.  Of note is that he did have preoperative chemotherapy followed by preoperative radiation therapy, then  surgery for a left groin sarcoma at [facility] back in 1993.  We are currently trying to obtain his records. He has a history of atrial fibrillation for the past 10-15 years and is on the Pradaxa.  He also has a history of gastric bypass surgery back in 1976 for weight loss.  He is doing well from a GU and GI standpoint.  His I PSS score is 8.  He does have erectile dysfunction which responds reasonably well to a vacuum device.   He had a fall this past September and fractured his left leg.  He t emporarily discontinued androgen gel from September through December but has recently restarted AndroGel.  Without androgen gel, he loses his sex drive.  PREVIOUS RADIATION THERAPY: Yes (records pending).   PAST MEDICAL HISTORY:  has a past medical history of A-fib; Hypertension; Primary prostate adenocarcinoma (07/08/14); History of radiation therapy (1993); and Cancer (1993).     PAST SURGICAL HISTORY:  Past Surgical History  Procedure Laterality Date  . Intestinal bypass  1976  . Knee arthroplasty Left     x 2  . External fixation arm Left 1986  . Patella fracture surgery      x 2  . Tonsillectomy    . Hernia repair  1977    incisional hernia  . Abdominal surgery  1978    incisional hernia  . Prostate biopsy  06/2014     FAMILY HISTORY: family history includes Atrial fibrillation in his father; Cancer in his sister; Liver disease in his mother; Renal Disease in his mother; Stroke in his mother.  His father died from a GI bleed at age 72.  His mother died at age 87, unknown etiology.  No family history of prostate cancer.   SOCIAL HISTORY:  reports that he has never smoked. He does not have any  smokeless tobacco history on file. He reports that he does not drink alcohol or use illicit drugs. Married, his only son died in motor vehicle accident at age 60.  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  He currently teaches at [employer].   ALLERGIES: Ace inhibitors   MEDICATIONS:  Current Outpatient Prescriptions  Medication Sig Dispense Refill  . BUPROPION HBR ER PO Take 1 tablet by mouth daily.     . Coenzyme Q10 (CO Q 10 PO) Take by mouth.    . dabigatran (PRADAXA) 150 MG CAPS capsule Take 150 mg by mouth every 12 (twelve) hours.    [name] diltiazem (TIAZAC) 240 MG 24 hr capsule Take 240 mg by mouth daily.    [name] escitalopram (LEXAPRO) 10 MG tablet Take 10 mg by mouth daily.    [name] METOPROLOL TARTRATE PO Take  1 tablet by mouth 2 (two) times daily.    . Multiple Vitamin (MULTIVITAMIN WITH MINERALS) TABS tablet Take 1 tablet by mouth daily.    . Testosterone 12.5 MG/ACT (1%) GEL Place 6 Squirts onto the skin every morning.     . valsartan-hydrochlorothiazide (DIOVAN-HCT) 160-25 MG per tablet Take 1 tablet by mouth daily.     No current facility-administered medications for this encounter.     REVIEW OF SYSTEMS:  Pertinent items are noted in HPI.    PHYSICAL EXAM:  height is 5' 8.5" (1.74 m) and weight is 291 lb 4.8 oz (132.133 kg). His oral temperature is 98.9 F (37.2 C). His blood pressure is 137/68 and his pulse is 65. His respiration is 22.   Alert and oriented 74 year old white male appearing his stated age.  Head and neck examination: Grossly unremarkable.  Nodes: Without palpable cervical or supraclavicular lymphadenopathy.  Chest: Lungs clear.  Abdomen: Obese, without masses organomegaly.  Pelvis: There are surgical scars and induration along the left groin from his previous surgery and radiation therapy.  He is able to flex his left hip to 90.  Rectal: Prostate gland is normal in size and is without focal induration or nodularity.  Extremities: Trace ankle edema.   LABORATORY DATA:  No results found for: WBC, HGB, HCT, MCV, PLT No results found for: NA, K, CL, CO2 No results found for: ALT, AST, GGT, ALKPHOS, BILITOT  PSA 3.09 from 06/20/2013.  I do not have any PSA values since November 2014 and the patient is not aware of having a PSA done since then.   IMPRESSION: StageT1c favorable to intermediate risk adenocarcinoma prostate.  I explained to the patient and his wife that his prognosis is related to his stage, Gleason score, and PSA level.  His stage and PSA are favorable while his Gleason score of 7 is of intermediate favorability.  For practical purposes, his biopsy with Gleason score 7 is rather focal involving less than 5% of one core.  He really can be considered as having  favorable to very early intermediate risk disease.  He does have medical comorbidities including atrial fibrillation.  We discussed surgery versus active surveillance versus radiation therapy.  Radiation therapy options include seed implantation alone or external beam/IMRT.  I feel that he would be a good candidate for either active surveillance or either form of radiation therapy.  We first need to obtain his radiation therapy records from [facility] to determine if the prostate received any amount of radiation therapy.  I will contact the patient after I  have this information.  We discussed the  potential acute and late toxicities of radiation therapy.  Another concern is his ongoing androgen supplementation for his hypogonadism knowing that he has biopsy-proven  prostate cancer.  This can be addressed by Dr. Gaynelle Arabian.  PLAN:  As discussed above.  I will contact the patient once I have his radiation therapy records from [facility].   I spent 60 minutes face to face with the patient and more than 50% of that time was spent in counseling and/or coordination of care.

## 2014-08-20 NOTE — Telephone Encounter (Signed)
Faxed release of information to [facility] for radiation records per Dr Charlton Amor request. Spoke with [place], [phone].  Received call back from [place] stating she has been unable to find any radiation records for pt in the [facility] system.  Called pt who insists he had radiation treatment at [facility]. He states he will look into his records at home and try to find the dr's name. He will call back.

## 2014-08-26 NOTE — Addendum Note (Signed)
Encounter addended by: Rexene Edison, MD on: 08/26/2014  4:50 PM<BR>     Documentation filed: Notes Section

## 2014-08-27 ENCOUNTER — Other Ambulatory Visit: Payer: Self-pay | Admitting: Radiation Oncology

## 2014-09-03 ENCOUNTER — Encounter: Payer: Self-pay | Admitting: Radiation Oncology

## 2014-09-03 NOTE — Progress Notes (Signed)
CC: Dr. Katrine Coho, Dr. Howell Rucks  I finally received his remaining radiation therapy records from [facility] from 1992.  As mentioned previously, he received preoperative radiation therapy to a dose of approximately 4100 cGy in 25 fractions over 17 days.  The [facility] dosimetrist tells me that the rectum/prostate probably received between 25-30% of the prescribed dose.  Unfortunately, there are no set up/field photographs, dosimetry, or simulation films saved to be certain of the dose distribution.  I would be reluctant to offer him external beam/IMRT, but I think it would be reasonable to consider seed implantation.  He did have colonoscopy earlier this year with Dr. Earle Gell, and I'll check with Dr. Wynetta Emery to see if he noted any changes along the rectum which would suggest that he received a dose of greater than 2000 cGy.  He does recall having some external hemorrhoidal bleeding during his therapy.  For a seed implant, we would need to have him visit [place] for a CT arch study.  I explained to Eric Mayer that an alternative would be for cryotherapy, and I really think that Dr. Gaynelle Arabian needs to go over the option of cryotherapy with him.  He remains concerned about his erectile function and ability to reach orgasm.  I explained him that radiation therapy can also cause erectile dysfunction which may be refers to some degree with a Viagra like drug.

## 2014-09-04 ENCOUNTER — Encounter: Payer: Self-pay | Admitting: Radiation Oncology

## 2014-09-04 NOTE — Progress Notes (Signed)
CC: Dr. Katrine Coho  I spoke with Dr. Wynetta Emery this morning reviewed colonoscopy/sigmoidoscopy findings from March 2015.  He did not see any rectal changes to suggest previous radiation therapy.  I'll have Mr. Hamada return here for a CT arch study sometime next week.  I spoke with Mr. Krakowski this morning and he will see Dr. Gaynelle Arabian for discussion of possible cryotherapy as well before making a decision regarding seed implantation.

## 2014-09-12 ENCOUNTER — Encounter: Payer: Self-pay | Admitting: Radiation Oncology

## 2014-09-12 ENCOUNTER — Ambulatory Visit
Admission: RE | Admit: 2014-09-12 | Discharge: 2014-09-12 | Disposition: A | Discharge status: Home / Self Care | Payer: Medicare Other | Source: Ambulatory Visit | Attending: Radiation Oncology | Admitting: Radiation Oncology

## 2014-09-12 VITALS — BP 152/82 | HR 82 | Temp 97.8°F | Ht 68.5 in | Wt 284.3 lb

## 2014-09-12 DIAGNOSIS — Z510 Encounter for antineoplastic radiation therapy: Secondary | ICD-10-CM | POA: Diagnosis not present

## 2014-09-12 DIAGNOSIS — C61 Malignant neoplasm of prostate: Secondary | ICD-10-CM

## 2014-09-12 NOTE — Progress Notes (Signed)
Chart note: I sat down and explained to [name] the uncertainty of what dose the rectum/bladder actually received from his previous radiation therapy at [facility].  Because of the time since his treatment, total dose delivered, and relatively low dose per fraction to the bladder/rectum, I feel that serious and late complications would be low, but not negligible.  We feel that the benefit of treatment outweighs these risks.  Consent is signed today.  In the meantime, we will try to reconstruct his treatment fields to see if we get a similar dose estimation given to [place] by [facility].

## 2014-09-12 NOTE — Progress Notes (Signed)
CC: Dr. Katrine Coho  Treatment planning/CT simulation arch study: The patient was taken to the CT simulator.  His previous treatment tattoos along the left groin were identified and marked for reconstruction of his previous radiation therapy at [facility] as best we can determine.  The CT data set was sent to the planning system where I contoured his prostate.  The prostate was projected along the bony arch which is open.  His prostate volume is 30.3 mL compared to 38 mL measured Dr. Gaynelle Arabian on ultrasound.  He is a candidate for prostate seed implantation.  I'm prescribing 14,500 cGy utilizing I-125 seeds.  He'll be treated with the  [place] ([place]).  We will do our best to determine the dose delivered to the rectum from his previous radiation therapy.  Because the dose per fraction was quite low, and because of the long time interval since his treatment I do not feel that he is at a high-risk for rectal toxicity.  Consent will be signed today by the patient.

## 2014-09-12 NOTE — Progress Notes (Signed)
See dictated note from earlier encounters today.

## 2014-09-17 ENCOUNTER — Telehealth: Payer: Self-pay | Admitting: *Deleted

## 2014-09-17 ENCOUNTER — Other Ambulatory Visit: Payer: Self-pay

## 2014-09-17 ENCOUNTER — Ambulatory Visit (HOSPITAL_BASED_OUTPATIENT_CLINIC_OR_DEPARTMENT_OTHER)
Admission: RE | Admit: 2014-09-17 | Discharge: 2014-09-17 | Disposition: A | Discharge status: Home / Self Care | Payer: Medicare Other | Source: Ambulatory Visit | Attending: Urology | Admitting: Urology

## 2014-09-17 ENCOUNTER — Encounter (HOSPITAL_BASED_OUTPATIENT_CLINIC_OR_DEPARTMENT_OTHER)
Admission: RE | Admit: 2014-09-17 | Discharge: 2014-09-17 | Disposition: A | Discharge status: Home / Self Care | Payer: Medicare Other | Source: Ambulatory Visit | Attending: Urology | Admitting: Urology

## 2014-09-17 ENCOUNTER — Other Ambulatory Visit: Payer: Self-pay | Admitting: Urology

## 2014-09-17 DIAGNOSIS — I4891 Unspecified atrial fibrillation: Secondary | ICD-10-CM | POA: Diagnosis not present

## 2014-09-17 DIAGNOSIS — Z01818 Encounter for other preprocedural examination: Secondary | ICD-10-CM | POA: Diagnosis not present

## 2014-09-17 DIAGNOSIS — C61 Malignant neoplasm of prostate: Secondary | ICD-10-CM | POA: Diagnosis not present

## 2014-09-17 DIAGNOSIS — Z923 Personal history of irradiation: Secondary | ICD-10-CM | POA: Diagnosis not present

## 2014-09-17 NOTE — Telephone Encounter (Signed)
Called patient to inform of implant date, spoke with patient and he is aware of this date. 

## 2014-09-18 ENCOUNTER — Encounter: Payer: Self-pay | Admitting: Radiation Oncology

## 2014-09-18 NOTE — Progress Notes (Signed)
Chart note: We reconstructed the patient's previous left groin/pelvic fields and we also agree with [facility] that the lateral aspect of the prostate/rectum received only 1200 cGy.  We are proceeding with seed implantation as planned.

## 2014-10-09 ENCOUNTER — Telehealth: Payer: Self-pay | Admitting: *Deleted

## 2014-10-09 NOTE — Telephone Encounter (Signed)
CALLED PATIENT TO REMIND OF LAB ON 10-10-14, LVM FOR A RETURN CALL

## 2014-10-14 ENCOUNTER — Encounter (HOSPITAL_BASED_OUTPATIENT_CLINIC_OR_DEPARTMENT_OTHER): Payer: Self-pay | Admitting: *Deleted

## 2014-10-14 DIAGNOSIS — F419 Anxiety disorder, unspecified: Secondary | ICD-10-CM | POA: Diagnosis not present

## 2014-10-14 DIAGNOSIS — N5201 Erectile dysfunction due to arterial insufficiency: Secondary | ICD-10-CM | POA: Diagnosis present

## 2014-10-14 DIAGNOSIS — Z9884 Bariatric surgery status: Secondary | ICD-10-CM | POA: Diagnosis not present

## 2014-10-14 DIAGNOSIS — I4891 Unspecified atrial fibrillation: Secondary | ICD-10-CM | POA: Diagnosis not present

## 2014-10-14 DIAGNOSIS — K259 Gastric ulcer, unspecified as acute or chronic, without hemorrhage or perforation: Secondary | ICD-10-CM | POA: Diagnosis not present

## 2014-10-14 DIAGNOSIS — C61 Malignant neoplasm of prostate: Secondary | ICD-10-CM | POA: Diagnosis not present

## 2014-10-14 DIAGNOSIS — I10 Essential (primary) hypertension: Secondary | ICD-10-CM | POA: Diagnosis not present

## 2014-10-14 DIAGNOSIS — E291 Testicular hypofunction: Secondary | ICD-10-CM | POA: Diagnosis not present

## 2014-10-14 DIAGNOSIS — Z7901 Long term (current) use of anticoagulants: Secondary | ICD-10-CM | POA: Diagnosis not present

## 2014-10-14 LAB — COMPREHENSIVE METABOLIC PANEL
ALT: 31 U/L (ref 0–53)
AST: 19 U/L (ref 0–37)
Albumin: 3.8 g/dL (ref 3.5–5.2)
Alkaline Phosphatase: 92 U/L (ref 39–117)
Anion gap: 5 (ref 5–15)
BUN: 18 mg/dL (ref 6–23)
CO2: 26 mmol/L (ref 19–32)
Calcium: 8.9 mg/dL (ref 8.4–10.5)
Chloride: 113 mmol/L — ABNORMAL HIGH (ref 96–112)
Creatinine, Ser: 0.75 mg/dL (ref 0.50–1.35)
GFR calc Af Amer: >90 mL/min (ref >90)
GFR calc non Af Amer: >90 mL/min (ref >90)
Glucose, Bld: 106 mg/dL — ABNORMAL HIGH (ref 70–99)
Potassium: 3.9 mmol/L (ref 3.5–5.1)
Sodium: 144 mmol/L (ref 135–145)
Total Bilirubin: 1 mg/dL (ref 0.3–1.2)
Total Protein: 6.2 g/dL (ref 6.0–8.3)

## 2014-10-14 LAB — CBC
HCT: 38.6 % — ABNORMAL LOW (ref 39.0–52.0)
Hemoglobin: 12.6 g/dL — ABNORMAL LOW (ref 13.0–17.0)
MCH: 30.2 pg (ref 26.0–34.0)
MCHC: 32.6 g/dL (ref 30.0–36.0)
MCV: 92.6 fL (ref 78.0–100.0)
Platelets: 199 10*3/uL (ref 150–400)
RBC: 4.17 MIL/uL — ABNORMAL LOW (ref 4.22–5.81)
RDW: 14.2 % (ref 11.5–15.5)
WBC: 6.8 10*3/uL (ref 4.0–10.5)

## 2014-10-14 LAB — APTT: aPTT: 38 seconds — ABNORMAL HIGH (ref 24–37)

## 2014-10-14 LAB — PROTIME-INR
INR: 1.14 (ref 0.00–1.49)
Prothrombin Time: 14.8 seconds (ref 11.6–15.2)

## 2014-10-15 ENCOUNTER — Encounter (HOSPITAL_BASED_OUTPATIENT_CLINIC_OR_DEPARTMENT_OTHER): Payer: Self-pay | Admitting: *Deleted

## 2014-10-15 NOTE — Progress Notes (Signed)
NPO AFTER MN. ARRIVE AT 0800. CURRENT LAB RESULTS , CXR, AND EKG IN CHART AND EPIC. WILL TAKE METOPROLOL, LEXAPRO, WELLBUTRIN, AND DILTIAZEM AM DOS W/ SIPS OF WATER AND DO FLEET ENEMA.

## 2014-10-16 ENCOUNTER — Ambulatory Visit
Admission: RE | Admit: 2014-10-16 | Discharge: 2014-10-16 | Disposition: A | Discharge status: Home / Self Care | Payer: Medicare Other | Source: Ambulatory Visit | Attending: Internal Medicine | Admitting: Internal Medicine

## 2014-10-16 ENCOUNTER — Encounter (HOSPITAL_BASED_OUTPATIENT_CLINIC_OR_DEPARTMENT_OTHER): Payer: Self-pay | Admitting: Anesthesiology

## 2014-10-16 ENCOUNTER — Telehealth: Payer: Self-pay | Admitting: *Deleted

## 2014-10-16 DIAGNOSIS — E279 Disorder of adrenal gland, unspecified: Principal | ICD-10-CM

## 2014-10-16 DIAGNOSIS — E278 Other specified disorders of adrenal gland: Secondary | ICD-10-CM

## 2014-10-16 MED ORDER — IOHEXOL 300 MG/ML  SOLN
125.0000 mL | Freq: Once | INTRAMUSCULAR | Status: AC | PRN
  Administered 2014-10-16: 125 mL via INTRAVENOUS

## 2014-10-16 NOTE — Telephone Encounter (Signed)
CALLED PATIENT TO REMIND OF PROCEDURE FOR 10-17-14, SPOKE WITH PATIENT AND HE IS AWARE OF THIS PROCEDURE.

## 2014-10-16 NOTE — Anesthesia Preprocedure Evaluation (Signed)
Anesthesia Evaluation  Patient identified by MRN, date of birth, ID band Patient awake    Reviewed: Allergy & Precautions, NPO status , Patient's Chart, lab work & pertinent test results  Airway Mallampati: II  TM Distance: >3 FB Neck ROM: Full    Dental no notable dental hx.    Pulmonary sleep apnea ,  breath sounds clear to auscultation  Pulmonary exam normal       Cardiovascular Exercise Tolerance: Good hypertension, Pt. on medications and Pt. on home beta blockers + dysrhythmias Atrial Fibrillation Rhythm:Regular Rate:Normal     Neuro/Psych negative neurological ROS  negative psych ROS   GI/Hepatic negative GI ROS, Neg liver ROS,   Endo/Other  Morbid obesity  Renal/GU negative Renal ROS  negative genitourinary   Musculoskeletal  (+) Arthritis -,   Abdominal (+) + obese,   Peds negative pediatric ROS (+)  Hematology negative hematology ROS (+)   Anesthesia Other Findings   Reproductive/Obstetrics negative OB ROS                             Anesthesia Physical Anesthesia Plan  ASA: III  Anesthesia Plan: General   Post-op Pain Management:    Induction: Intravenous  Airway Management Planned: Oral ETT  Additional Equipment:   Intra-op Plan:   Post-operative Plan: Extubation in OR  Informed Consent: I have reviewed the patients History and Physical, chart, labs and discussed the procedure including the risks, benefits and alternatives for the proposed anesthesia with the patient or authorized representative who has indicated his/her understanding and acceptance.   Dental advisory given  Plan Discussed with: CRNA  Anesthesia Plan Comments:         Anesthesia Quick Evaluation

## 2014-10-17 ENCOUNTER — Encounter (HOSPITAL_BASED_OUTPATIENT_CLINIC_OR_DEPARTMENT_OTHER): Payer: Self-pay | Admitting: Anesthesiology

## 2014-10-17 ENCOUNTER — Ambulatory Visit (HOSPITAL_BASED_OUTPATIENT_CLINIC_OR_DEPARTMENT_OTHER): Payer: Medicare Other | Admitting: Anesthesiology

## 2014-10-17 ENCOUNTER — Encounter (HOSPITAL_BASED_OUTPATIENT_CLINIC_OR_DEPARTMENT_OTHER)
Admission: RE | Disposition: A | Discharge status: Home / Self Care | Payer: Self-pay | Source: Ambulatory Visit | Attending: Urology

## 2014-10-17 ENCOUNTER — Encounter: Payer: Self-pay | Admitting: Radiation Oncology

## 2014-10-17 ENCOUNTER — Ambulatory Visit (HOSPITAL_COMMUNITY): Payer: Medicare Other

## 2014-10-17 ENCOUNTER — Ambulatory Visit (HOSPITAL_BASED_OUTPATIENT_CLINIC_OR_DEPARTMENT_OTHER)
Admission: RE | Admit: 2014-10-17 | Discharge: 2014-10-17 | Disposition: A | Discharge status: Home / Self Care | Payer: Medicare Other | Source: Ambulatory Visit | Attending: Urology | Admitting: Urology

## 2014-10-17 DIAGNOSIS — I10 Essential (primary) hypertension: Secondary | ICD-10-CM | POA: Insufficient documentation

## 2014-10-17 DIAGNOSIS — E291 Testicular hypofunction: Secondary | ICD-10-CM | POA: Diagnosis not present

## 2014-10-17 DIAGNOSIS — I4891 Unspecified atrial fibrillation: Secondary | ICD-10-CM | POA: Diagnosis not present

## 2014-10-17 DIAGNOSIS — C61 Malignant neoplasm of prostate: Secondary | ICD-10-CM | POA: Diagnosis not present

## 2014-10-17 DIAGNOSIS — K259 Gastric ulcer, unspecified as acute or chronic, without hemorrhage or perforation: Secondary | ICD-10-CM | POA: Insufficient documentation

## 2014-10-17 DIAGNOSIS — Z9884 Bariatric surgery status: Secondary | ICD-10-CM | POA: Insufficient documentation

## 2014-10-17 DIAGNOSIS — F419 Anxiety disorder, unspecified: Secondary | ICD-10-CM | POA: Insufficient documentation

## 2014-10-17 DIAGNOSIS — N5201 Erectile dysfunction due to arterial insufficiency: Secondary | ICD-10-CM | POA: Diagnosis not present

## 2014-10-17 DIAGNOSIS — Z510 Encounter for antineoplastic radiation therapy: Secondary | ICD-10-CM | POA: Diagnosis not present

## 2014-10-17 DIAGNOSIS — Z7901 Long term (current) use of anticoagulants: Secondary | ICD-10-CM | POA: Insufficient documentation

## 2014-10-17 HISTORY — DX: Unspecified osteoarthritis, unspecified site: M19.90

## 2014-10-17 HISTORY — PX: RADIOACTIVE SEED IMPLANT: SHX5150

## 2014-10-17 HISTORY — DX: Other symptoms and signs involving the musculoskeletal system: R29.898

## 2014-10-17 HISTORY — DX: Other injury of unspecified body region, initial encounter: T14.8XXA

## 2014-10-17 HISTORY — DX: Testicular hypofunction: E29.1

## 2014-10-17 HISTORY — DX: Nocturia: R35.1

## 2014-10-17 HISTORY — DX: Personal history of malignant neoplasm of soft tissue: Z85.831

## 2014-10-17 HISTORY — DX: Dependence on other enabling machines and devices: Z99.89

## 2014-10-17 HISTORY — DX: Personal history of peptic ulcer disease: Z87.11

## 2014-10-17 HISTORY — DX: Obstructive sleep apnea (adult) (pediatric): G47.33

## 2014-10-17 HISTORY — DX: Presence of spectacles and contact lenses: Z97.3

## 2014-10-17 HISTORY — DX: Unspecified right bundle-branch block: I45.10

## 2014-10-17 HISTORY — DX: Bariatric surgery status: Z98.84

## 2014-10-17 SURGERY — INSERTION, RADIATION SOURCE, PROSTATE
Anesthesia: General | Site: Prostate

## 2014-10-17 MED ORDER — DEXAMETHASONE SODIUM PHOSPHATE 4 MG/ML IJ SOLN
INTRAMUSCULAR | Status: DC | PRN
  Administered 2014-10-17: 10 mg via INTRAVENOUS

## 2014-10-17 MED ORDER — BELLADONNA ALKALOIDS-OPIUM 16.2-60 MG RE SUPP
RECTAL | Status: AC
  Filled 2014-10-17: qty 1

## 2014-10-17 MED ORDER — CIPROFLOXACIN IN D5W 400 MG/200ML IV SOLN
400.0000 mg | INTRAVENOUS | Status: AC
  Administered 2014-10-17: 400 mg via INTRAVENOUS
  Filled 2014-10-17: qty 200

## 2014-10-17 MED ORDER — TAMSULOSIN HCL 0.4 MG PO CAPS: 0.4000 mg | ORAL_CAPSULE | Freq: Every day | ORAL | Status: DC

## 2014-10-17 MED ORDER — CIPROFLOXACIN IN D5W 400 MG/200ML IV SOLN
INTRAVENOUS | Status: AC
  Filled 2014-10-17: qty 200

## 2014-10-17 MED ORDER — KETOROLAC TROMETHAMINE 30 MG/ML IJ SOLN
INTRAMUSCULAR | Status: DC | PRN
  Administered 2014-10-17: 30 mg via INTRAVENOUS

## 2014-10-17 MED ORDER — FLEET ENEMA 7-19 GM/118ML RE ENEM
1.0000 | ENEMA | Freq: Once | RECTAL | Status: DC
  Filled 2014-10-17: qty 1

## 2014-10-17 MED ORDER — PROMETHAZINE HCL 25 MG/ML IJ SOLN
6.2500 mg | INTRAMUSCULAR | Status: DC | PRN
  Filled 2014-10-17: qty 1

## 2014-10-17 MED ORDER — FENTANYL CITRATE 0.05 MG/ML IJ SOLN
INTRAMUSCULAR | Status: AC
  Filled 2014-10-17: qty 6

## 2014-10-17 MED ORDER — MIDAZOLAM HCL 2 MG/2ML IJ SOLN
INTRAMUSCULAR | Status: AC
  Filled 2014-10-17: qty 2

## 2014-10-17 MED ORDER — IOHEXOL 350 MG/ML SOLN
INTRAVENOUS | Status: DC | PRN
  Administered 2014-10-17: 7 mL

## 2014-10-17 MED ORDER — FENTANYL CITRATE 0.05 MG/ML IJ SOLN
25.0000 ug | INTRAMUSCULAR | Status: DC | PRN
  Filled 2014-10-17: qty 1

## 2014-10-17 MED ORDER — MIDAZOLAM HCL 5 MG/5ML IJ SOLN
INTRAMUSCULAR | Status: DC | PRN
  Administered 2014-10-17: 2 mg via INTRAVENOUS

## 2014-10-17 MED ORDER — STERILE WATER FOR IRRIGATION IR SOLN
Status: DC | PRN
  Administered 2014-10-17: 3000 mL

## 2014-10-17 MED ORDER — ACETAMINOPHEN 10 MG/ML IV SOLN
INTRAVENOUS | Status: DC | PRN
  Administered 2014-10-17: 1000 mg via INTRAVENOUS

## 2014-10-17 MED ORDER — TRIMETHOPRIM 100 MG PO TABS: 100.0000 mg | ORAL_TABLET | ORAL | Status: DC

## 2014-10-17 MED ORDER — PROPOFOL 10 MG/ML IV BOLUS
INTRAVENOUS | Status: DC | PRN
  Administered 2014-10-17: 200 mg via INTRAVENOUS

## 2014-10-17 MED ORDER — LIDOCAINE HCL 4 % MT SOLN
OROMUCOSAL | Status: DC | PRN
  Administered 2014-10-17: 4 mL via TOPICAL

## 2014-10-17 MED ORDER — LIDOCAINE HCL (CARDIAC) 20 MG/ML IV SOLN
INTRAVENOUS | Status: DC | PRN
  Administered 2014-10-17: 50 mg via INTRAVENOUS

## 2014-10-17 MED ORDER — GLYCOPYRROLATE 0.2 MG/ML IJ SOLN
INTRAMUSCULAR | Status: DC | PRN
  Administered 2014-10-17: 0.6 mg via INTRAVENOUS

## 2014-10-17 MED ORDER — NEOSTIGMINE METHYLSULFATE 10 MG/10ML IV SOLN
INTRAVENOUS | Status: DC | PRN
  Administered 2014-10-17: 4 mg via INTRAVENOUS

## 2014-10-17 MED ORDER — PHENAZOPYRIDINE HCL 200 MG PO TABS: 200.0000 mg | ORAL_TABLET | Freq: Three times a day (TID) | ORAL | Status: DC | PRN

## 2014-10-17 MED ORDER — TRAMADOL-ACETAMINOPHEN 37.5-325 MG PO TABS: 1.0000 | ORAL_TABLET | Freq: Four times a day (QID) | ORAL | Status: DC | PRN

## 2014-10-17 MED ORDER — ONDANSETRON HCL 4 MG/2ML IJ SOLN
INTRAMUSCULAR | Status: DC | PRN
  Administered 2014-10-17: 4 mg via INTRAVENOUS

## 2014-10-17 MED ORDER — LACTATED RINGERS IV SOLN
INTRAVENOUS | Status: DC
  Administered 2014-10-17 (×2): via INTRAVENOUS
  Filled 2014-10-17: qty 1000

## 2014-10-17 MED ORDER — FENTANYL CITRATE 0.05 MG/ML IJ SOLN
INTRAMUSCULAR | Status: DC | PRN
  Administered 2014-10-17: 25 ug via INTRAVENOUS
  Administered 2014-10-17: 50 ug via INTRAVENOUS
  Administered 2014-10-17 (×2): 25 ug via INTRAVENOUS
  Administered 2014-10-17: 50 ug via INTRAVENOUS
  Administered 2014-10-17: 25 ug via INTRAVENOUS

## 2014-10-17 MED ORDER — SUCCINYLCHOLINE CHLORIDE 20 MG/ML IJ SOLN
INTRAMUSCULAR | Status: DC | PRN
  Administered 2014-10-17: 100 mg via INTRAVENOUS

## 2014-10-17 MED ORDER — BELLADONNA ALKALOIDS-OPIUM 16.2-60 MG RE SUPP
RECTAL | Status: DC | PRN
  Administered 2014-10-17: 1 via RECTAL

## 2014-10-17 MED ORDER — ROCURONIUM BROMIDE 100 MG/10ML IV SOLN
INTRAVENOUS | Status: DC | PRN
  Administered 2014-10-17: 30 mg via INTRAVENOUS
  Administered 2014-10-17 (×3): 10 mg via INTRAVENOUS

## 2014-10-17 MED ORDER — EPHEDRINE SULFATE 50 MG/ML IJ SOLN
INTRAMUSCULAR | Status: DC | PRN
  Administered 2014-10-17 (×2): 12.5 mg via INTRAVENOUS
  Administered 2014-10-17 (×2): 10 mg via INTRAVENOUS
  Administered 2014-10-17: 5 mg via INTRAVENOUS

## 2014-10-17 MED ORDER — METOCLOPRAMIDE HCL 5 MG/ML IJ SOLN
INTRAMUSCULAR | Status: DC | PRN
  Administered 2014-10-17: 10 mg via INTRAVENOUS

## 2014-10-17 SURGICAL SUPPLY — 31 items
BAG URINE DRAINAGE (UROLOGICAL SUPPLIES) ×2 IMPLANT
BLADE CLIPPER SURG (BLADE) ×2 IMPLANT
CATH FOLEY 2WAY SLVR  5CC 16FR (CATHETERS) ×2
CATH FOLEY 2WAY SLVR 5CC 16FR (CATHETERS) ×2 IMPLANT
CATH ROBINSON RED A/P 20FR (CATHETERS) ×2 IMPLANT
CLOTH BEACON ORANGE TIMEOUT ST (SAFETY) ×2 IMPLANT
COVER MAYO STAND STRL (DRAPES) ×2 IMPLANT
COVER TABLE BACK 60X90 (DRAPES) ×2 IMPLANT
DRSG TEGADERM 4X4.75 (GAUZE/BANDAGES/DRESSINGS) ×2 IMPLANT
DRSG TEGADERM 8X12 (GAUZE/BANDAGES/DRESSINGS) ×2 IMPLANT
GLOVE BIO SURGEON STRL SZ7 (GLOVE) ×1 IMPLANT
GLOVE BIO SURGEON STRL SZ7.5 (GLOVE) ×9 IMPLANT
GLOVE BIOGEL M 6.5 STRL (GLOVE) ×1 IMPLANT
GLOVE BIOGEL PI IND STRL 6.5 (GLOVE) IMPLANT
GLOVE BIOGEL PI IND STRL 7.0 (GLOVE) IMPLANT
GLOVE BIOGEL PI IND STRL 7.5 (GLOVE) IMPLANT
GLOVE BIOGEL PI INDICATOR 6.5 (GLOVE) ×2
GLOVE BIOGEL PI INDICATOR 7.0 (GLOVE) ×1
GLOVE BIOGEL PI INDICATOR 7.5 (GLOVE) ×1
GLOVE ECLIPSE 8.0 STRL XLNG CF (GLOVE) IMPLANT
GOWN PREVENTION PLUS LG XLONG (DISPOSABLE) ×1 IMPLANT
GOWN STRL REIN XL XLG (GOWN DISPOSABLE) ×1 IMPLANT
GOWN STRL REUS W/TWL LRG LVL3 (GOWN DISPOSABLE) ×1 IMPLANT
GOWN STRL REUS W/TWL XL LVL3 (GOWN DISPOSABLE) ×1 IMPLANT
HOLDER FOLEY CATH W/STRAP (MISCELLANEOUS) ×2 IMPLANT
NUCLETRON SELECTSEED ×68 IMPLANT
PACK CYSTO (CUSTOM PROCEDURE TRAY) ×2 IMPLANT
SYRINGE 10CC LL (SYRINGE) ×2 IMPLANT
UNDERPAD 30X30 INCONTINENT (UNDERPADS AND DIAPERS) ×4 IMPLANT
WATER STERILE IRR 3000ML UROMA (IV SOLUTION) ×1 IMPLANT
WATER STERILE IRR 500ML POUR (IV SOLUTION) ×2 IMPLANT

## 2014-10-17 NOTE — Progress Notes (Signed)
[facility] Operative Procedure Note  Name: Eric Mayer MRN: [id number]  Date:   09/17/2014           DOB: Apr 29, 1941  Status:outpatient    CC:  Dr. 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  PROCEDURE: Insertion of radioactive I-125 seeds into the prostate gland.  RADIATION DOSE: 145 Gy, definitive therapy.  TECHNIQUE: JHAMIR PICKUP was brought to the operating room with Dr. Gaynelle Arabian. He was placed in the dorsolithotomy position. He was catheterized and a rectal tube was inserted. The perineum was shaved, prepped and draped. The ultrasound probe was then introduced into the rectum to see the prostate gland.  TREATMENT DEVICE: A needle grid was attached to the ultrasound probe stand and anchor needles were placed.  COMPLEX ISODOSE CALCULATION: The prostate was imaged in 3D using a sagittal sweep of the prostate probe. These images were transferred to the planning computer. There, the prostate, urethra and rectum were defined on each axial reconstructed image. Then, the software created an optimized plan and a few seed positions were adjusted. Then the accepted plan was uploaded to the seed Selectron afterloading unit.  SPECIAL TREATMENT PROCEDURE/SUPERVISION AND HANDLING: The Nucletron FIRST system was used to place the needles under sagittal guidance. A total of 22 needles were used to deposit 68 seeds in the prostate gland. The individual seed activity was 0.48 mCi for a total implant activity of 34.1 mCi.  COMPLEX SIMULATION: At the end of the procedure, an anterior radiograph of the pelvis was obtained to document seed positioning and count. Cystoscopy was performed to check the urethra and bladder.  MICRODOSIMETRY: At the end of the procedure, the patient was emitting 0.1 mrem/hr at 1 meter. Accordingly, he was considered safe for hospital  discharge.  PLAN: The patient will return to the radiation oncology clinic for post implant CT dosimetry in three weeks.

## 2014-10-17 NOTE — Anesthesia Procedure Notes (Addendum)
Procedure Name: Intubation Date/Time: 10/17/2014 9:55 AM Performed by: CALLAWAY, ROBIN G Pre-anesthesia Checklist: Patient identified, Emergency Drugs available, Suction available and Patient being monitored Patient Re-evaluated:Patient Re-evaluated prior to inductionOxygen Delivery Method: Circle System Utilized Preoxygenation: Pre-oxygenation with 100% oxygen Intubation Type: IV induction Ventilation: Mask ventilation without difficulty Laryngoscope Size: Mac and 4 Grade View: Grade I Tube type: Oral Tube size: 8.0 mm Number of attempts: 1 Airway Equipment and Method: Stylet,  LTA kit utilized and Patient positioned with wedge pillow Placement Confirmation: ETT inserted through vocal cords under direct vision,  positive ETCO2 and breath sounds checked- equal and bilateral Secured at: 23 cm Tube secured with: Tape Dental Injury: Teeth and Oropharynx as per pre-operative assessment  Comments: Intubated by Dr. Denneny.     

## 2014-10-17 NOTE — Discharge Instructions (Addendum)
Brachytherapy for Prostate Cancer Brachytherapy for prostate cancer is radiation treatment given from the inside of the prostate instead of from the outside by an external beam. There are two types of brachytherapy:  Low-dose-rate therapy. This involves seed or pellet implantation.  High-dose-rate therapy. This is given by thin tubes that contain radioactive material. When the seed method (also called implant therapy) is used, 80 to 120 little pellets are placed into the prostate and left in place. Because the radiation does not travel far from the prostate, healthy, noncancerous tissues around the prostate receive only a small dose of radiation, which helps protect them from injury. The radiation fades away over the next few months. If the high-dose therapy is used, the thin tubes of radiation are removed after the treatment and there is no radiation left in the prostate when you leave the hospital. This type of treatment is followed by a course of radiation by an external beam method on an outpatient basis. LET [facility] CARE PROVIDER KNOW ABOUT:   Any allergies you have.  All medicines you are taking, including blood thinners, vitamins, herbs, eye drops, creams, and over-the-counter medicines.  Previous problems you or members of your family have had with the use of anesthetics.  Any blood disorders you have had.  Previous surgeries you have had.  Medical conditions you have. RISKS AND COMPLICATIONS  Generally, brachytherapy for prostate cancer is a safe procedure. However, problems can occur.  Possible short-term problems include:  Difficulty passing urine. You may need a catheter for a few days to a month.  Blood in the urine or semen.  A feeling of constipation because of prostate swelling.  Frequent feeling of an urgent need to urinate. Possible long-term problems include:  Inflammation of the rectum. This happens in about 2% of people who have the procedure.  Erection  problems. These vary with age and occur in about 15-40% of men.  Difficulty urinating. This is caused by scarring in the urethra.  Diarrhea. BEFORE THE PROCEDURE  You will be scheduled for some tests, which may include an ultrasound, CT scan, or MRI. These tests do not hurt. A specialist in radiation treatment will meet with you and will decide which type of brachytherapy to use based on these imaging tests. PROCEDURE   You will be given a medicine that makes you fall asleep (general anesthetic).  A small probe will be placed in the rectum.  Ultrasound waves will be used to guide the insertion of small tubes into the prostate in preplanned locations.  If the seed method (low-dose) is used:  Small pellets the size of a rice grain will be placed into the tube.  The tube will then be removed, leaving the seeds in the prostate.  If the high-dose method is used:  Radioactive wires will be inserted and left in until a specific dose of radiation has been given.  The wires will then be removed.  In both methods, a catheter is usually placed into the bladder. AFTER THE PROCEDURE  Follow-up depends on the type of treatment given. With the high-dose treatment, there is no radiation risk after the treatment. Usually additional treatment with external beam radiation will be provided. This will require a series of visits back to the clinic as an outpatient.  With the seed implant, some physicians recommend that close contact with children and pregnant women be limited because of the low dose of radiation still in the prostate. That radiation will be almost gone by 2  months, and by 4-6 months the levels will be almost undetectable. Document Released: 01/10/2006 Document Revised: 12/17/2013 Document Reviewed: 01/23/2013 [facility] Patient Information 2015 [place], [place]. This information is not intended to replace advice given to you by your health care provider. Make sure you discuss any questions  you have with your health care provider.  Post Anesthesia Home Care Instructions  Activity: Get plenty of rest for the remainder of the day. A responsible adult should stay with you for 24 hours following the procedure.  For the next 24 hours, DO NOT: -Drive a car -Paediatric nurse -Drink alcoholic beverages -Take any medication unless instructed by your physician -Make any legal decisions or sign important papers.  Meals: Start with liquid foods such as gelatin or soup. Progress to regular foods as tolerated. Avoid greasy, spicy, heavy foods. If nausea and/or vomiting occur, drink only clear liquids until the nausea and/or vomiting subsides. Call your physician if vomiting continues.  Special Instructions/Symptoms: Your throat may feel dry or sore from the anesthesia or the breathing tube placed in your throat during surgery. If this causes discomfort, gargle with warm salt water. The discomfort should disappear within 24 hours.     Post Anesthesia Home Care Instructions  Activity: Get plenty of rest for the remainder of the day. A responsible adult should stay with you for 24 hours following the procedure.  For the next 24 hours, DO NOT: -Drive a car -Paediatric nurse -Drink alcoholic beverages -Take any medication unless instructed by your physician -Make any legal decisions or sign important papers.  Meals: Start with liquid foods such as gelatin or soup. Progress to regular foods as tolerated. Avoid greasy, spicy, heavy foods. If nausea and/or vomiting occur, drink only clear liquids until the nausea and/or vomiting subsides. Call your physician if vomiting continues.  Special Instructions/Symptoms: Your throat may feel dry or sore from the anesthesia or the breathing tube placed in your throat during surgery. If this causes discomfort, gargle with warm salt water. The discomfort should disappear within 24 hours.

## 2014-10-17 NOTE — Transfer of Care (Signed)
  Last Vitals:  Filed Vitals:   10/17/14 0809  BP: 153/66  Pulse: 65  Temp: 36.9 C  Resp: 18    Immediate Anesthesia Transfer of Care Note  Patient: Eric Mayer  Procedure(s) Performed: Procedure(s) (LRB): RADIOACTIVE SEED IMPLANT     (N/A)  Patient Location: PACU  Anesthesia Type: General  Level of Consciousness: awake, alert  and oriented  Airway & Oxygen Therapy: Patient Spontanous Breathing and Patient connected to face mask oxygen  Post-op Assessment: Report given to PACU RN and Post -op Vital signs reviewed and stable  Post vital signs: Reviewed and stable  Complications: No apparent anesthesia complications

## 2014-10-17 NOTE — H&P (Signed)
Reason For Visit Consult, discuss options   Active Problems Problems  1. 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 Hypogonadism, testicular (E29.1) 3. Prostate cancer (C61)  History of Present Illness    74 yo male returns today for consult to discuss options after seeing Dr. Alinda Money & Dr. Valere Dross. Hx of atrial fibrillation (managed with Pradaxa), depression, hypertension, morbid obesity s/p a gastric bypass in 1976, ventral hernia, and obstructive sleep apnea who was noted to have an elevated PSA of 5.29 during his routine physical exam this year. He was evaluated by Dr. Gaynelle Arabian and a 4K test indicated a 48% chance for Gleason 7 or higher prostate cancer. He underwent a prostate needle biopsy on 07/08/14 that demonstrated Gleason 3+4=7 adenocarcinoma with 5 out of 12 biopsy cores positive for malignancy. He has been counseled by Dr. Gaynelle Arabian and has been scheduled to see Dr. Valere Dross.    He does have a history of severe erectile dysfunction refractory to oral medications and intracavernosal injections. He uses a VED. He also has a history of testosterone deficiency and has been treated with Androgel but stopped in September 2015. 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    TNM stage: cT1c Nx Mx  PSA: 5.29  Gleason score: 3+4=7  Biopsy (07/08/14): 5/12 cores positive    Left: L lateral mid (20%, 3+3=6), L mid (5%, 3+4=7)    Right: R apex (80%, 3+3=6), R lateral apex (40%, 3+3=6), R lateral mid (10%, 3+3=6)  Prostate volume: 37.9 cc    Nomogram  OC disease: 46%  EPE: 53%  SVI: 3%  LNI: 2%  PFS (surgery): 89% at 7 years, 81% at 12 years    Urinary function: He has very mild lower urinary tract symptoms. IPSS is 9.  Erectile function: He does have severe erectile dysfunction. SHIM score is 7. He does use a vacuum erection device successfully. He has recently failed oral medical therapy and  intracavernosal injection therapy.      Past Medical History Problems  1. History of Arthritis 2. History of Atrial fibrillation (I48.91) 3. History of Gastric ulcer (K25.9) 4. History of depression (Z86.59) 5. History of hypertension (Z86.79) 6. History of Sarcoma  Surgical History Problems  1. History of Dermatological Surgery 2. History of Gastric Surgery For Morbid Obesity Gastric Bypass 3. History of Ventral Hernia Repair  Current Meds 1. Adderall 20 MG Oral Tablet; Takes as needed;  Therapy: (Recorded:02Dec2014) to Recorded 2. Diltiazem HCl ER Coated Beads 240 MG Oral Capsule Extended Release  24 Hour;  Therapy: (Recorded:02Aug2011) to Recorded 3. Diovan HCT 160-25 MG Oral Tablet;  Therapy: (Recorded:02Aug2011) to Recorded 4. Lexapro TABS;  Therapy: (Recorded:05Nov2014) to Recorded 5. Metoprolol Tartrate 50 MG Oral Tablet;  Therapy: (Recorded:02Aug2011) to Recorded 6. Multi-Vitamin TABS;  Therapy: (Recorded:02Aug2011) to Recorded 7. Pradaxa 150 MG Oral Capsule;  Therapy: (Recorded:02Aug2011) to Recorded 8. Vitamin B-12 1000 MCG/ML SOLN;  Therapy: (Recorded:05Nov2014) to Recorded 9. Wellbutrin XL 300 MG Oral Tablet Extended Release 24 Hour;  Therapy: (Recorded:02Aug2011) to Recorded  Allergies Medication  1. No Known Drug Allergies  Family History Problems  1. Family history of Atrial Fibrillation : Father 2. Family history of Cancer : Sister 3. Family history of Chronic Liver Disease : Mother 4. Family history of Father Deceased At Age ____ 10. Family history of Mother Deceased At Age ____ 87. Family history of Renal Disease : Mother 7. Family history of Stroke Syndrome : Mother  Social History Problems  Denied: History of Alcohol Use   Caffeine Use   Marital History - Currently Married   Never A Smoker   Denied: History of Tobacco Use  Review of Systems Genitourinary, constitutional, skin, eye, otolaryngeal, hematologic/lymphatic,  cardiovascular, pulmonary, endocrine, musculoskeletal, gastrointestinal, neurological and psychiatric system(s) were reviewed and pertinent findings if present are noted and are otherwise negative.  Gastrointestinal: diarrhea, but no nausea, no vomiting, no heartburn and no constipation.  Constitutional: no fever, no night sweats and not feeling tired (fatigue).  Integumentary: no new skin rashes or lesions and no pruritus.  Eyes: no blurred vision and no diplopia.  ENT: no sore throat and no sinus problems.  Hematologic/Lymphatic: no tendency to easily bruise and no swollen glands.  Cardiovascular: leg swelling, but no chest pain.  Respiratory: cough, but no shortness of breath.  Endocrine: no polydipsia.  Musculoskeletal: joint pain, but no back pain.  Neurological: no headache and no dizziness.  Psychiatric: no anxiety and no depression.    Vitals Vital Signs [Data Includes: Last 1 Day]  Recorded: [id number] 04:12PM  Blood Pressure: 168 / 77 Temperature: 98.1 F Heart Rate: 80  Physical Exam Constitutional: Well nourished and well developed . No acute distress.  ENT:. The ears and nose are normal in appearance.  Neck: The appearance of the neck is normal.  Pulmonary: No respiratory distress . SOB.  LLE edema  Abdomen: The abdomen is obese. The abdomen is soft and nontender. No masses are palpated. No CVA tenderness. No hernias are palpable. No hepatosplenomegaly noted.  Rectal: Rectal exam demonstrates normal sphincter tone, no tenderness and no masses. Estimated prostate size is 3+. Normal rectal tone, no rectal masses, prostate is smooth, symmetric and non-tender. The prostate has no nodularity and is not tender. The left seminal vesicle is nonpalpable. The right seminal vesicle is nonpalpable. The perineum is normal on inspection.  Genitourinary: Examination of the penis demonstrates no discharge, no masses, no lesions and a normal meatus. The scrotum is normal in appearance and  without lesions. The right epididymis is palpably normal and non-tender. The left epididymis is palpably normal and non-tender. The right testis is non-tender and without masses. The left testis is non-tender and without masses.  Lymphatics: The femoral and inguinal nodes are not enlarged or tender. Left lower lymph drainage.  Skin: Normal skin turgor, no visible rash and no visible skin lesions.  Neuro/Psych:. Mood and affect are appropriate.    Assessment Assessed  1. Prostate cancer (C61) 2. Hypogonadism, testicular (E29.1) 3. 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 Awaiting word from Dr. 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 He had bypass surgery 45 yrs ago, and has significant incision from xyphoid to pubis. He is not though to be an ideal candidate for robotic surgery by Dr. Alinda Money, but may be a radiation candidate per Drl. Valere Dross. He could have primary cryotherapy, but this would render him impotent-but know that he already uses a vacuum pump for ED, and that he has significant cancer in the Right peripheral zone: lateral and medial/apex, and will need aggressive Rx in those areas.    He has stopped his male hormone therapy. He is going on a cruise for his 21 wedding anniversary in February, so he probably won't be ready for treatment until early March. 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 ( 45 minutes).   Plan RTC after  hearing from Dr. Valere Dross.   Signatures Electronically signed by : Carolan Clines, M.D.; Aug 28 2014  5:03PM EST

## 2014-10-17 NOTE — Progress Notes (Signed)
CC: Dr. Katrine Coho  End of treatment summary:  Diagnosis: Stage TIc intermediate risk adenocarcinoma prostate  Requesting physician: Dr. Katrine Coho  Intent: Curative  Procedure: Prostate seed implant  Site/dose prostate 14,500 cGy utilizing 68 I-125 seeds with an individual seed activity of 0.48 mCi per seed for a total implant of 34.1 mCi.  Narrative: The patient appears to have undergone a successful Oncentra seed implant with Dr. Gaynelle Arabian.  Plan: Follow-up visit with Dr. Gaynelle Arabian and myself in approximately 3 weeks.  We will obtain a CT scanning of that time to assess the quality of his implant.

## 2014-10-17 NOTE — Interval H&P Note (Signed)
History and Physical Interval Note:  10/17/2014 9:56 AM  Eric Mayer  has presented today for surgery, with the diagnosis of PROSTATE CANCER  The various methods of treatment have been discussed with the patient and family. After consideration of risks, benefits and other options for treatment, the patient has consented to  Procedure(s): RADIOACTIVE SEED IMPLANT (N/A) as a surgical intervention .  The patient's history has been reviewed, patient examined, no change in status, stable for surgery.  I have reviewed the patient's chart and labs.  Questions were answered to the patient's satisfaction.     Eric Mayer I

## 2014-10-17 NOTE — Op Note (Signed)
Pre-operative diagnosis :   T1c Adenocarcinoma Prostate  Postoperative diagnosis:  Same  Operation:  I-125 Seed implant  Surgeon:  S. Gaynelle Arabian, MD  Radiation Therapist: Arloa Koh, MD  Anesthesia:  General LMA  Preparation: After appropriate preanesthesia, the patient was brought to the operative room, placed on the operating table in the dorsal supine position, where general LMA anesthesia was introduced. He was then replaced in the dorsal lithotomy position with pubis was prepped with Betadine solution and draped in usual fashion. The armband was double checked. The history was double checked.  Review history:  Active Problems Problems  1. 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 Hypogonadism, testicular (E29.1) 3. Prostate cancer (C61)  History of Present Illness   74 yo male returns today for consult to discuss options after seeing Dr. Alinda Money & Dr. Valere Dross. Hx of atrial fibrillation (managed with Pradaxa), depression, hypertension, morbid obesity s/p a gastric bypass in 1976, ventral hernia, and obstructive sleep apnea who was noted to have an elevated PSA of 5.29 during his routine physical exam this year. He was evaluated by Dr. Gaynelle Arabian and a 4K test indicated a 48% chance for Gleason 7 or higher prostate cancer. He underwent a prostate needle biopsy on 07/08/14 that demonstrated Gleason 3+4=7 adenocarcinoma with 5 out of 12 biopsy cores positive for malignancy. He has been counseled by Dr. Gaynelle Arabian and has been scheduled to see Dr. Valere Dross.    He does have a history of severe erectile dysfunction refractory to oral medications and intracavernosal injections. He uses a VED. He also has a history of testosterone deficiency and has been treated with Androgel but stopped in September 2015. 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    TNM stage: cT1c Nx Mx  PSA: 5.29   Statement of  Likelihood of Success:  Excellent. TIME-OUT observed.:  Procedure:  The patient underwent prostate ultrasonography, and real time dosimetry planning per Dr. Valere Dross. Following complete radiation therapy planning, a second timeout was accomplished, and under ultrasonic control, as well as fluoroscopy, 22 separate needles were placed in the prostate, with 68 iodine seeds placed. The total radiation dose delivered is 145 gray. The patient tolerated the procedure well, and was given IV Tylenol, and IV Toradol during the procedure for postop pain relief.  Following seed implantation, flexible cystoscopy was accomplished, which showed normal-appearing urethra, 0, with patent bladder neck. The bladder itself showed normal trigone, with clear reflux from both orifices. There was no trabeculation, no cellules, no bladder stones, no bladder tumor,  And no seeds identified.    A 16 French Foley catheter was placed to straight drainage. The patient was awakened, and taken to recovery room in good condition.

## 2014-10-17 NOTE — Anesthesia Postprocedure Evaluation (Signed)
  Anesthesia Post-op Note  Patient: Eric Mayer  Procedure(s) Performed: Procedure(s) (LRB): RADIOACTIVE SEED IMPLANT     (N/A)  Patient Location: PACU  Anesthesia Type: General  Level of Consciousness: awake and alert   Airway and Oxygen Therapy: Patient Spontanous Breathing  Post-op Pain: mild  Post-op Assessment: Post-op Vital signs reviewed, Patient's Cardiovascular Status Stable, Respiratory Function Stable, Patent Airway and No signs of Nausea or vomiting  Last Vitals:  Filed Vitals:   10/17/14 1450  BP: 140/60  Pulse: 62  Temp: 36.4 C  Resp: 16    Post-op Vital Signs: stable   Complications: No apparent anesthesia complications

## 2014-10-18 ENCOUNTER — Encounter (HOSPITAL_BASED_OUTPATIENT_CLINIC_OR_DEPARTMENT_OTHER): Payer: Self-pay | Admitting: Urology

## 2014-11-06 ENCOUNTER — Telehealth: Payer: Self-pay | Admitting: *Deleted

## 2014-11-06 ENCOUNTER — Ambulatory Visit: Payer: Medicare Other | Admitting: Radiation Oncology

## 2014-11-06 NOTE — Telephone Encounter (Signed)
Called patient to remind of appts. For 11-07-14, spoke with patient and he is aware of these appts.

## 2014-11-07 ENCOUNTER — Ambulatory Visit
Admission: RE | Admit: 2014-11-07 | Discharge: 2014-11-07 | Disposition: A | Discharge status: Home / Self Care | Payer: Medicare Other | Source: Ambulatory Visit | Attending: Radiation Oncology | Admitting: Radiation Oncology

## 2014-11-07 ENCOUNTER — Encounter: Payer: Self-pay | Admitting: Radiation Oncology

## 2014-11-07 VITALS — BP 127/64 | HR 80 | Temp 98.3°F | Resp 12 | Wt 284.0 lb

## 2014-11-07 DIAGNOSIS — Z510 Encounter for antineoplastic radiation therapy: Secondary | ICD-10-CM | POA: Diagnosis not present

## 2014-11-07 DIAGNOSIS — C61 Malignant neoplasm of prostate: Secondary | ICD-10-CM

## 2014-11-07 NOTE — Progress Notes (Signed)
CC: Eric Mayer  Eric Mayer returns today approximately 3 weeks following his prostate seed implant with Eric Mayer.  He saw Dr. Arlyn Leak PA earlier today.  He will see Eric Mayer for a follow-up visit in 3 months for a PSA determination.  He states that he does have some increasing urinary frequency and urgency which is slowly improving.  He does wear a diaper when he is out of his home because of his urinary urgency not wanting to have an accident.[name]  He denies dysuria.  He has nocturia 2.  He did not start Flomax, not feeling that he had difficulty with his urinary stream.  No GI difficulties.  He did not take any pain medication. His CT scan today shows what appears to be a desirable seed distribution.  Physical examination: Alert and oriented. Filed Vitals:   11/07/14 1211  BP: 127/64  Pulse: 80  Temp: 98.3 F (36.8 C)  Resp: 12   Rectal examination not performed today.  Impression: Satisfactory progress with some degree of urethritis/cystitis.  Plan: Follow-up with Eric Mayer in 3 months.  I've not scheduled Eric Mayer for a formal follow-up visit and I ask that Eric Mayer keep me posted on his progress.  We will move ahead with his post implant dosimetry and for the results to Eric Mayer.

## 2014-11-07 NOTE — Progress Notes (Signed)
Complex simulation note: The patient was taken to the CT simulator.  He was placed supine.  His pelvis was scanned.  The CT distribution looked desirable.  The CT data set was sent to the planning system where contoured his prostate and rectum.  He is now ready for 3-D simulation to assess the quality of his implant.

## 2014-11-07 NOTE — Progress Notes (Signed)
He is currently in no pain.  Pt reports urinary urgency, hesistency, hot flashes, incontinence and dribbling. Pt states they urinate 1 - 2 times per night.  Pt reports loose bowel movements, ongoing.   BP 127/64 mmHg  Pulse 80  Temp(Src) 98.3 F (36.8 C) (Oral)  Resp 12  Wt 284 lb (128.822 kg)  SpO2 96%

## 2014-11-08 DIAGNOSIS — Z510 Encounter for antineoplastic radiation therapy: Secondary | ICD-10-CM | POA: Diagnosis not present

## 2014-11-16 ENCOUNTER — Encounter: Payer: Self-pay | Admitting: Radiation Oncology

## 2014-11-16 NOTE — Progress Notes (Signed)
CC: Dr. Katrine Coho  Post implant CT dosimetry note:  Eric Mayer underwent post-implant CT dosimetry to assess the quality of his seed implant with Dr. Gaynelle Arabian.  His intraoperative prostate volume by ultrasound was 31.8 mL and his three-week postoperative prostate volume by CT was 30.9 mL, a very  good correlation.  Dose volume histograms were obtained for the prostate and rectum.  His prostate D 90 is 118.5%, and V100 97.8%, both excellent.  Only 0.13 mL of rectum received the prescribed dose of 14,500 cGy.  In summary, the patient has excellent post implant dosimetry with a low risk for late rectal toxicity.

## 2014-11-28 ENCOUNTER — Other Ambulatory Visit: Payer: Self-pay | Admitting: Orthopaedic Surgery

## 2014-12-05 ENCOUNTER — Encounter (HOSPITAL_COMMUNITY)
Admission: RE | Admit: 2014-12-05 | Discharge: 2014-12-05 | Disposition: A | Discharge status: Home / Self Care | Payer: Medicare Other | Source: Ambulatory Visit | Attending: Orthopaedic Surgery | Admitting: Orthopaedic Surgery

## 2014-12-05 ENCOUNTER — Encounter (HOSPITAL_COMMUNITY): Payer: Self-pay

## 2014-12-05 DIAGNOSIS — I10 Essential (primary) hypertension: Secondary | ICD-10-CM | POA: Insufficient documentation

## 2014-12-05 DIAGNOSIS — Z0183 Encounter for blood typing: Secondary | ICD-10-CM | POA: Insufficient documentation

## 2014-12-05 DIAGNOSIS — G4733 Obstructive sleep apnea (adult) (pediatric): Secondary | ICD-10-CM | POA: Insufficient documentation

## 2014-12-05 DIAGNOSIS — C61 Malignant neoplasm of prostate: Secondary | ICD-10-CM | POA: Diagnosis not present

## 2014-12-05 DIAGNOSIS — I4510 Unspecified right bundle-branch block: Secondary | ICD-10-CM | POA: Insufficient documentation

## 2014-12-05 DIAGNOSIS — I480 Paroxysmal atrial fibrillation: Secondary | ICD-10-CM | POA: Diagnosis not present

## 2014-12-05 DIAGNOSIS — M179 Osteoarthritis of knee, unspecified: Secondary | ICD-10-CM | POA: Diagnosis not present

## 2014-12-05 DIAGNOSIS — Z01818 Encounter for other preprocedural examination: Secondary | ICD-10-CM | POA: Diagnosis not present

## 2014-12-05 DIAGNOSIS — Z01812 Encounter for preprocedural laboratory examination: Secondary | ICD-10-CM | POA: Insufficient documentation

## 2014-12-05 HISTORY — DX: Major depressive disorder, single episode, unspecified: F32.9

## 2014-12-05 HISTORY — DX: Depression, unspecified: F32.A

## 2014-12-05 LAB — PROTIME-INR
INR: 1.15 (ref 0.00–1.49)
Prothrombin Time: 14.9 seconds (ref 11.6–15.2)

## 2014-12-05 LAB — CBC WITH DIFFERENTIAL/PLATELET
Basophils Absolute: 0 10*3/uL (ref 0.0–0.1)
Basophils Relative: 0 % (ref 0–1)
Eosinophils Absolute: 0.3 10*3/uL (ref 0.0–0.7)
Eosinophils Relative: 4 % (ref 0–5)
HCT: 38.4 % — ABNORMAL LOW (ref 39.0–52.0)
Hemoglobin: 12.4 g/dL — ABNORMAL LOW (ref 13.0–17.0)
Lymphocytes Relative: 20 % (ref 12–46)
Lymphs Abs: 1.4 10*3/uL (ref 0.7–4.0)
MCH: 30.8 pg (ref 26.0–34.0)
MCHC: 32.3 g/dL (ref 30.0–36.0)
MCV: 95.3 fL (ref 78.0–100.0)
Monocytes Absolute: 0.4 10*3/uL (ref 0.1–1.0)
Monocytes Relative: 5 % (ref 3–12)
Neutro Abs: 4.9 10*3/uL (ref 1.7–7.7)
Neutrophils Relative %: 71 % (ref 43–77)
Platelets: 189 10*3/uL (ref 150–400)
RBC: 4.03 MIL/uL — ABNORMAL LOW (ref 4.22–5.81)
RDW: 13.2 % (ref 11.5–15.5)
WBC: 6.9 10*3/uL (ref 4.0–10.5)

## 2014-12-05 LAB — URINALYSIS, ROUTINE W REFLEX MICROSCOPIC
Bilirubin Urine: NEGATIVE
Glucose, UA: NEGATIVE mg/dL
Hgb urine dipstick: NEGATIVE
Ketones, ur: NEGATIVE mg/dL
Leukocytes, UA: NEGATIVE
Nitrite: NEGATIVE
Protein, ur: NEGATIVE mg/dL
Specific Gravity, Urine: 1.019 (ref 1.005–1.030)
Urobilinogen, UA: 0.2 mg/dL (ref 0.0–1.0)
pH: 5.5 (ref 5.0–8.0)

## 2014-12-05 LAB — BASIC METABOLIC PANEL
Anion gap: 5 (ref 5–15)
BUN: 14 mg/dL (ref 6–23)
CO2: 27 mmol/L (ref 19–32)
Calcium: 9.1 mg/dL (ref 8.4–10.5)
Chloride: 107 mmol/L (ref 96–112)
Creatinine, Ser: 0.79 mg/dL (ref 0.50–1.35)
GFR calc Af Amer: >90 mL/min (ref >90)
GFR calc non Af Amer: 88 mL/min — ABNORMAL LOW (ref >90)
Glucose, Bld: 105 mg/dL — ABNORMAL HIGH (ref 70–99)
Potassium: 4 mmol/L (ref 3.5–5.1)
Sodium: 139 mmol/L (ref 135–145)

## 2014-12-05 LAB — TYPE AND SCREEN
ABO/RH(D): O POS
Antibody Screen: NEGATIVE

## 2014-12-05 LAB — APTT: aPTT: 40 seconds — ABNORMAL HIGH (ref 24–37)

## 2014-12-05 LAB — ABO/RH: ABO/RH(D): O POS

## 2014-12-05 LAB — SURGICAL PCR SCREEN
MRSA, PCR: NEGATIVE
Staphylococcus aureus: NEGATIVE

## 2014-12-05 NOTE — Progress Notes (Signed)
Requested most recent office visit from Dr. Frann Rider EKG at their office for comparison.

## 2014-12-05 NOTE — Pre-Procedure Instructions (Signed)
KELSON QUEENAN  12/05/2014   Your procedure is scheduled on:  12-17-2014  Tuesday   Report to [facility] Admitting at 10:30 AM.   Call this number if you have problems the morning of surgery: [phone]   Remember:   Do not eat food or drink liquids after midnight.    Take these medicines the morning of surgery with A SIP OF WATER: tylenol if needed,adderall xl if needed,bupropion(wellbutrin),diltiazem,lexapro,metoprolol,flomax   Do not wear jewelry, make-up   Do not wear lotions, powders, or perfumes.   Do not shave 48 hours prior to surgery. Men may shave face and neck.   Do not bring valuables to the hospital.  [facility] is not responsible for any belongings or valuables.               Contacts, dentures or bridgework may not be worn into surgery.   Leave suitcase in the car. After surgery it may be brought to your room.  For patients admitted to the hospital, discharge time is determined by your   treatment team.                Special Instructions: See attached sheet for instructions on CHG shower/bath   Please read over the following fact sheets that you were given: Pain Booklet, Coughing and Deep Breathing and Surgical Site Infection Prevention

## 2014-12-05 NOTE — Progress Notes (Signed)
Pt. Instructed to call Dr. Jerald Kief office for instructions on pradaxa.

## 2014-12-06 NOTE — Progress Notes (Addendum)
Anesthesia Chart Review:  Pt is 74 year old male scheduled for R total knee arthroplasty on 12/17/2014 with Dr. Rhona Raider.   PCP is Dr. [name]. No longer sees cardiology.   PMH includes: PAF, HTN, incomplete RBBB, OSA on CPAP, prostate cancer. Never smoker. BMI 43. S/p radioactive seed implant (prostate) on 10/17/14.   Medications include: pradaxa, diltiazem, metoprolol. Juliann Pulse at Dr. Jerald Kief office spoke with Dr. Laurann Montana about stopping pradaxa for surgery. Dr. Laurann Montana gave permission to hold pradaxa for 2-3 days prior to surgery; Juliann Pulse will notify patient.   Preoperative labs reviewed.  PTT 40.   Chest x-ray 09/17/2014 reviewed. Mild enlargement of the cardiac silhouette with central pulmonary vascular prominence may reflect low-grade compensated CHF. There is no acute pulmonary edema. No pulmonary parenchymal masses or lymphadenopathy are demonstrated.  EKG 09/17/2014: Sinus rhythm with occasional PVCs and PACs. Incomplete RBBB. Moderate voltage criteria for LVH, may be normal variant. Since previous tracing poor lateral R wave progression could be lead placement suggest clinical correlation or repeat  Echo 08/08/2003: - Overall left ventricular systolic function was normal. Left ventricular ejection fraction was estimated , range being 55% to 65 %. There were no left ventricular regional wall motion abnormalities. Left ventricular wall thickness was moderately increased. - There was mild mitral valvular regurgitation. - The left atrium was moderately dilated. 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 - There was a trivial pericardial effusion posterior to the heart. Effusion dimensions: 6 mm (posterior).  Willeen Cass, FNP-BC [facility]/Anesthesiology Phone: [phone] 12/10/2014 1:25 PM

## 2014-12-11 NOTE — H&P (Signed)
TOTAL KNEE ADMISSION H&P  Patient is being admitted for right total knee arthroplasty.  Subjective:  Chief Complaint:right knee pain.  HPI: Eric Mayer, 74 y.o. male, has a history of pain and functional disability in the right knee due to arthritis and has failed non-surgical conservative treatments for greater than 12 weeks to includeNSAID's and/or analgesics, corticosteriod injections, viscosupplementation injections, flexibility and strengthening excercises, use of assistive devices, weight reduction as appropriate and activity modification.  Onset of symptoms was gradual, starting 5 years ago with gradually worsening course since that time. The patient noted no past surgery on the right knee(s).  Patient currently rates pain in the right knee(s) at 10 out of 10 with activity. Patient has night pain, worsening of pain with activity and weight bearing, pain that interferes with activities of daily living, crepitus and joint swelling.  Patient has evidence of subchondral cysts, subchondral sclerosis, periarticular osteophytes and joint space narrowing by imaging studies. There is no active infection.  Patient Active Problem List   Diagnosis Date Noted  . Malignant neoplasm of prostate 08/20/2014   Past Medical History  Diagnosis Date  . Hypertension   . History of radiation therapy 1993    sarcoma of left groin, tx at [facility]  . PAF (paroxysmal atrial fibrillation)     prior CARDIOLOGIST-- DR Tressia Miners TURNER/   now monitored by dr Jenny Reichmann griffin  . Incomplete right bundle branch block   . History of sarcoma     1993  LEFT GOIN--  S/P SURGERY, RADIATION AND CHEMO IN [place]  . Hypogonadism in male   . History of peptic ulcer     1980's  . History of gastric bypass   . 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 Weakness of left leg     SECONDARY TO SURGICAL NERVE INJURY OF LEFT GOIN  . Nocturia   .  Arthritis   . Wears glasses   . OSA on CPAP   . Depression   . Primary prostate adenocarcinoma DX 07/08/14    Gleason 7,  stage T1c    Past Surgical History  Procedure Laterality Date  . Intestinal bypass  1976    GASTRIC FOR OBESITY  . Patella fracture surgery Left x2  1995  . Prostate biopsy  06/2014  . Ventral hernia repair  1977  . Left goin sarcoma surgery  1992   [place]  . Colonoscopy  07-03-2002  . Transthoracic echocardiogram  08-08-2003    moderate LVH/  ef 55-65%/  mild MR/  moderate LAE/  trivial TR/  trivial pericardial effusion posterior to the heart  . Orif left arm fx  1986  . Tonsillectomy  as child  . Radioactive seed implant N/A 10/17/2014    Procedure: RADIOACTIVE SEED IMPLANT    ;  Surgeon: Ailene Rud, MD;  Location: [facility];  Service: Urology;  Laterality: N/A;   56 SEEDS IMPLANTED NO SEEDS FOUND IN BLADDER  . Cardiac catheterization  08-12-2003  dr Tressia Miners turner    Normal coronary arteries, normal wall motion, no sig. abnormalities  . Hernia repair      as child    No prescriptions prior to admission   Allergies  Allergen Reactions  . Ace Inhibitors Other (See Comments)    cough    History  Substance Use Topics  . Smoking status: Never Smoker   .  Smokeless tobacco: Never Used  . Alcohol Use: No    Family History  Problem Relation Age of Onset  . Liver disease Mother   . Renal Disease Mother   . Stroke Mother   . Atrial fibrillation Father   . Cancer Sister      Review of Systems  Musculoskeletal: Positive for joint pain.       Right knee  All other systems reviewed and are negative.   Objective:  Physical Exam  Constitutional: He is oriented to person, place, and time. He appears well-developed and well-nourished.  HENT:  Head: Normocephalic and atraumatic.  Eyes: Pupils are equal, round, and reactive to light.  Neck: Normal range of motion.  Cardiovascular: Normal rate and regular rhythm.    Respiratory: Effort normal.  GI: Soft.  Musculoskeletal:  Right knee motion is from 0-110.  He has some crepitation but no effusion.  I do not see any scars.  Hip motion is full and straight leg raise is negative.  Opposite knee motion is slightly less.  He has a longitudinal incision there with some crepitation on range.  Sensation and motor function are intact in his feet with palpable pulses on both sides.  There is no palpable lymphadenopathy behind his knee.    Neurological: He is alert and oriented to person, place, and time.  Skin: Skin is warm.  Psychiatric: He has a normal mood and affect. His behavior is normal. Judgment and thought content normal.    Vital signs in last 24 hours:    Labs:   Estimated body mass index is 42.55 kg/(m^2) as calculated from the following:   Height as of 10/17/14: 5' 8.5" (1.74 m).   Weight as of 11/07/14: 128.822 kg (284 lb).   Imaging Review Plain radiographs demonstrate severe degenerative joint disease of the right knee(s). The overall alignment isneutral. The bone quality appears to be good for age and reported activity level.  Assessment/Plan:  End stage primary arthritis, right knee   The patient history, physical examination, clinical judgment of the provider and imaging studies are consistent with end stage degenerative joint disease of the right knee(s) and total knee arthroplasty is deemed medically necessary. The treatment options including medical management, injection therapy arthroscopy and arthroplasty were discussed at length. The risks and benefits of total knee arthroplasty were presented and reviewed. The risks due to aseptic loosening, infection, stiffness, patella tracking problems, thromboembolic complications and other imponderables were discussed. The patient acknowledged the explanation, agreed to proceed with the plan and consent was signed. Patient is being admitted for inpatient treatment for surgery, pain control, PT, OT,  prophylactic antibiotics, VTE prophylaxis, progressive ambulation and ADL's and discharge planning. The patient is planning to be discharged home with home health services

## 2014-12-16 MED ORDER — CHLORHEXIDINE GLUCONATE 4 % EX LIQD
60.0000 mL | Freq: Once | CUTANEOUS | Status: DC
  Filled 2014-12-16: qty 60

## 2014-12-16 MED ORDER — DEXTROSE 5 % IV SOLN
3.0000 g | INTRAVENOUS | Status: AC
  Administered 2014-12-17: 3 g via INTRAVENOUS
  Filled 2014-12-16: qty 3000

## 2014-12-16 MED ORDER — LACTATED RINGERS IV SOLN
INTRAVENOUS | Status: DC
  Administered 2014-12-17 (×2): via INTRAVENOUS

## 2014-12-17 ENCOUNTER — Encounter (HOSPITAL_COMMUNITY)
Admission: RE | Disposition: A | Discharge status: Skilled Nursing Facility | Payer: Self-pay | Source: Ambulatory Visit | Attending: Orthopaedic Surgery

## 2014-12-17 ENCOUNTER — Inpatient Hospital Stay (HOSPITAL_COMMUNITY): Payer: Medicare Other | Admitting: Emergency Medicine

## 2014-12-17 ENCOUNTER — Inpatient Hospital Stay (HOSPITAL_COMMUNITY)
Admission: RE | Admit: 2014-12-17 | Discharge: 2014-12-20 | DRG: 470 | Disposition: A | Discharge status: Skilled Nursing Facility | Payer: Medicare Other | Source: Ambulatory Visit | Attending: Orthopaedic Surgery | Admitting: Orthopaedic Surgery

## 2014-12-17 ENCOUNTER — Inpatient Hospital Stay (HOSPITAL_COMMUNITY): Payer: Medicare Other | Admitting: Anesthesiology

## 2014-12-17 DIAGNOSIS — I480 Paroxysmal atrial fibrillation: Secondary | ICD-10-CM | POA: Diagnosis present

## 2014-12-17 DIAGNOSIS — Z9884 Bariatric surgery status: Secondary | ICD-10-CM | POA: Diagnosis not present

## 2014-12-17 DIAGNOSIS — G4733 Obstructive sleep apnea (adult) (pediatric): Secondary | ICD-10-CM | POA: Diagnosis present

## 2014-12-17 DIAGNOSIS — Z79899 Other long term (current) drug therapy: Secondary | ICD-10-CM

## 2014-12-17 DIAGNOSIS — C61 Malignant neoplasm of prostate: Secondary | ICD-10-CM | POA: Diagnosis present

## 2014-12-17 DIAGNOSIS — Z6841 Body Mass Index (BMI) 40.0 and over, adult: Secondary | ICD-10-CM

## 2014-12-17 DIAGNOSIS — Z85831 Personal history of malignant neoplasm of soft tissue: Secondary | ICD-10-CM | POA: Diagnosis not present

## 2014-12-17 DIAGNOSIS — Z923 Personal history of irradiation: Secondary | ICD-10-CM | POA: Diagnosis not present

## 2014-12-17 DIAGNOSIS — Z823 Family history of stroke: Secondary | ICD-10-CM

## 2014-12-17 DIAGNOSIS — E291 Testicular hypofunction: Secondary | ICD-10-CM | POA: Diagnosis present

## 2014-12-17 DIAGNOSIS — I10 Essential (primary) hypertension: Secondary | ICD-10-CM | POA: Diagnosis present

## 2014-12-17 DIAGNOSIS — M1711 Unilateral primary osteoarthritis, right knee: Secondary | ICD-10-CM | POA: Diagnosis present

## 2014-12-17 DIAGNOSIS — M25561 Pain in right knee: Secondary | ICD-10-CM | POA: Diagnosis present

## 2014-12-17 HISTORY — PX: TOTAL KNEE ARTHROPLASTY: SHX125

## 2014-12-17 SURGERY — ARTHROPLASTY, KNEE, TOTAL
Anesthesia: Regional | Site: Knee | Laterality: Right

## 2014-12-17 MED ORDER — ONDANSETRON HCL 4 MG/2ML IJ SOLN
INTRAMUSCULAR | Status: AC
  Filled 2014-12-17: qty 2

## 2014-12-17 MED ORDER — ONDANSETRON HCL 4 MG/2ML IJ SOLN
INTRAMUSCULAR | Status: DC | PRN
  Administered 2014-12-17: 4 mg via INTRAVENOUS

## 2014-12-17 MED ORDER — AMPHETAMINE-DEXTROAMPHET ER 10 MG PO CP24
20.0000 mg | ORAL_CAPSULE | Freq: Every day | ORAL | Status: DC | PRN
  Filled 2014-12-17: qty 2

## 2014-12-17 MED ORDER — DILTIAZEM HCL ER BEADS 240 MG PO CP24
240.0000 mg | ORAL_CAPSULE | Freq: Every morning | ORAL | Status: DC
  Administered 2014-12-19 – 2014-12-20 (×2): 240 mg via ORAL
  Filled 2014-12-17 (×3): qty 1

## 2014-12-17 MED ORDER — PROPOFOL 10 MG/ML IV BOLUS
INTRAVENOUS | Status: DC | PRN
  Administered 2014-12-17: 10 mg via INTRAVENOUS
  Administered 2014-12-17: 180 mg via INTRAVENOUS
  Administered 2014-12-17 (×3): 20 mg via INTRAVENOUS

## 2014-12-17 MED ORDER — MIDAZOLAM HCL 2 MG/2ML IJ SOLN
INTRAMUSCULAR | Status: AC
  Administered 2014-12-17: 2 mg
  Filled 2014-12-17: qty 2

## 2014-12-17 MED ORDER — DOCUSATE SODIUM 100 MG PO CAPS
100.0000 mg | ORAL_CAPSULE | Freq: Two times a day (BID) | ORAL | Status: DC
  Administered 2014-12-17 – 2014-12-19 (×5): 100 mg via ORAL
  Filled 2014-12-17 (×6): qty 1

## 2014-12-17 MED ORDER — EPHEDRINE SULFATE 50 MG/ML IJ SOLN
INTRAMUSCULAR | Status: AC
  Filled 2014-12-17: qty 1

## 2014-12-17 MED ORDER — PHENYLEPHRINE HCL 10 MG/ML IJ SOLN
INTRAMUSCULAR | Status: AC
  Filled 2014-12-17: qty 1

## 2014-12-17 MED ORDER — DABIGATRAN ETEXILATE MESYLATE 150 MG PO CAPS
150.0000 mg | ORAL_CAPSULE | Freq: Two times a day (BID) | ORAL | Status: DC
  Administered 2014-12-17 – 2014-12-20 (×6): 150 mg via ORAL
  Filled 2014-12-17 (×8): qty 1

## 2014-12-17 MED ORDER — IRBESARTAN 150 MG PO TABS
150.0000 mg | ORAL_TABLET | Freq: Every day | ORAL | Status: DC
  Administered 2014-12-20: 150 mg via ORAL
  Filled 2014-12-17 (×2): qty 1

## 2014-12-17 MED ORDER — BUPROPION HCL ER (XL) 150 MG PO TB24
300.0000 mg | ORAL_TABLET | Freq: Every morning | ORAL | Status: DC
  Administered 2014-12-18 – 2014-12-20 (×3): 300 mg via ORAL
  Filled 2014-12-17 (×3): qty 2

## 2014-12-17 MED ORDER — FENTANYL CITRATE (PF) 250 MCG/5ML IJ SOLN
INTRAMUSCULAR | Status: AC
  Filled 2014-12-17: qty 5

## 2014-12-17 MED ORDER — MENTHOL 3 MG MT LOZG: 1.0000 | LOZENGE | OROMUCOSAL | Status: DC | PRN

## 2014-12-17 MED ORDER — METOCLOPRAMIDE HCL 5 MG/ML IJ SOLN
5.0000 mg | Freq: Three times a day (TID) | INTRAMUSCULAR | Status: DC | PRN
  Administered 2014-12-17: 10 mg via INTRAVENOUS
  Filled 2014-12-17: qty 2

## 2014-12-17 MED ORDER — HYDROMORPHONE HCL 1 MG/ML IJ SOLN
INTRAMUSCULAR | Status: AC
  Filled 2014-12-17: qty 1

## 2014-12-17 MED ORDER — FENTANYL CITRATE (PF) 100 MCG/2ML IJ SOLN
INTRAMUSCULAR | Status: AC
  Administered 2014-12-17: 100 ug
  Filled 2014-12-17: qty 2

## 2014-12-17 MED ORDER — EPHEDRINE SULFATE 50 MG/ML IJ SOLN
INTRAMUSCULAR | Status: DC | PRN
  Administered 2014-12-17 (×3): 5 mg via INTRAVENOUS
  Administered 2014-12-17 (×2): 10 mg via INTRAVENOUS

## 2014-12-17 MED ORDER — METHOCARBAMOL 500 MG PO TABS
ORAL_TABLET | ORAL | Status: AC
  Filled 2014-12-17: qty 1

## 2014-12-17 MED ORDER — ONDANSETRON HCL 4 MG/2ML IJ SOLN
4.0000 mg | Freq: Four times a day (QID) | INTRAMUSCULAR | Status: DC | PRN
  Administered 2014-12-17 – 2014-12-18 (×3): 4 mg via INTRAVENOUS
  Filled 2014-12-17 (×3): qty 2

## 2014-12-17 MED ORDER — LACTATED RINGERS IV SOLN
INTRAVENOUS | Status: DC
  Administered 2014-12-17: 11:00:00 via INTRAVENOUS

## 2014-12-17 MED ORDER — 0.9 % SODIUM CHLORIDE (POUR BTL) OPTIME
TOPICAL | Status: DC | PRN
  Administered 2014-12-17: 1000 mL

## 2014-12-17 MED ORDER — FENTANYL CITRATE (PF) 250 MCG/5ML IJ SOLN
INTRAMUSCULAR | Status: DC | PRN
  Administered 2014-12-17 (×7): 25 ug via INTRAVENOUS
  Administered 2014-12-17 (×2): 50 ug via INTRAVENOUS
  Administered 2014-12-17: 25 ug via INTRAVENOUS

## 2014-12-17 MED ORDER — SODIUM CHLORIDE 0.9 % IJ SOLN
INTRAMUSCULAR | Status: AC
  Filled 2014-12-17: qty 10

## 2014-12-17 MED ORDER — BUPIVACAINE-EPINEPHRINE (PF) 0.5% -1:200000 IJ SOLN
INTRAMUSCULAR | Status: DC | PRN
  Administered 2014-12-17: 20 mL via PERINEURAL

## 2014-12-17 MED ORDER — TRANEXAMIC ACID 1000 MG/10ML IV SOLN
2000.0000 mg | Freq: Once | INTRAVENOUS | Status: DC
  Filled 2014-12-17: qty 20

## 2014-12-17 MED ORDER — ONDANSETRON HCL 4 MG PO TABS: 4.0000 mg | ORAL_TABLET | Freq: Four times a day (QID) | ORAL | Status: DC | PRN

## 2014-12-17 MED ORDER — METHOCARBAMOL 1000 MG/10ML IJ SOLN
500.0000 mg | Freq: Four times a day (QID) | INTRAVENOUS | Status: DC | PRN
  Filled 2014-12-17: qty 5

## 2014-12-17 MED ORDER — BISACODYL 5 MG PO TBEC: 5.0000 mg | DELAYED_RELEASE_TABLET | Freq: Every day | ORAL | Status: DC | PRN

## 2014-12-17 MED ORDER — BUPIVACAINE LIPOSOME 1.3 % IJ SUSP
20.0000 mL | Freq: Once | INTRAMUSCULAR | Status: DC
  Filled 2014-12-17: qty 20

## 2014-12-17 MED ORDER — METOPROLOL TARTRATE 25 MG PO TABS
25.0000 mg | ORAL_TABLET | Freq: Two times a day (BID) | ORAL | Status: DC
  Administered 2014-12-17 – 2014-12-20 (×4): 25 mg via ORAL
  Filled 2014-12-17 (×5): qty 1

## 2014-12-17 MED ORDER — BUPIVACAINE-EPINEPHRINE (PF) 0.5% -1:200000 IJ SOLN
INTRAMUSCULAR | Status: DC | PRN
  Administered 2014-12-17: 30 mL

## 2014-12-17 MED ORDER — LIDOCAINE HCL (CARDIAC) 20 MG/ML IV SOLN
INTRAVENOUS | Status: DC | PRN
  Administered 2014-12-17: 80 mg via INTRAVENOUS

## 2014-12-17 MED ORDER — HYDROCODONE-ACETAMINOPHEN 5-325 MG PO TABS
1.0000 | ORAL_TABLET | ORAL | Status: DC | PRN
  Administered 2014-12-17 – 2014-12-20 (×13): 2 via ORAL
  Filled 2014-12-17 (×14): qty 2

## 2014-12-17 MED ORDER — METOCLOPRAMIDE HCL 5 MG PO TABS: 5.0000 mg | ORAL_TABLET | Freq: Three times a day (TID) | ORAL | Status: DC | PRN

## 2014-12-17 MED ORDER — ACETAMINOPHEN 325 MG PO TABS: 650.0000 mg | ORAL_TABLET | Freq: Four times a day (QID) | ORAL | Status: DC | PRN

## 2014-12-17 MED ORDER — PROMETHAZINE HCL 25 MG/ML IJ SOLN
INTRAMUSCULAR | Status: AC
  Filled 2014-12-17: qty 1

## 2014-12-17 MED ORDER — VALSARTAN-HYDROCHLOROTHIAZIDE 160-25 MG PO TABS: 1.0000 | ORAL_TABLET | Freq: Every morning | ORAL | Status: DC

## 2014-12-17 MED ORDER — TAMSULOSIN HCL 0.4 MG PO CAPS
0.4000 mg | ORAL_CAPSULE | Freq: Every day | ORAL | Status: DC
  Administered 2014-12-17 – 2014-12-20 (×4): 0.4 mg via ORAL
  Filled 2014-12-17 (×4): qty 1

## 2014-12-17 MED ORDER — LIDOCAINE HCL (CARDIAC) 20 MG/ML IV SOLN
INTRAVENOUS | Status: AC
  Filled 2014-12-17: qty 5

## 2014-12-17 MED ORDER — ONDANSETRON HCL 4 MG/2ML IJ SOLN
INTRAMUSCULAR | Status: DC
Start: 2014-12-17 — End: 2014-12-17
  Filled 2014-12-17: qty 2

## 2014-12-17 MED ORDER — GLYCOPYRROLATE 0.2 MG/ML IJ SOLN
INTRAMUSCULAR | Status: AC
  Filled 2014-12-17: qty 1

## 2014-12-17 MED ORDER — LACTATED RINGERS IV SOLN
INTRAVENOUS | Status: DC
  Administered 2014-12-17 – 2014-12-18 (×2): via INTRAVENOUS

## 2014-12-17 MED ORDER — TRANEXAMIC ACID 1000 MG/10ML IV SOLN
2000.0000 mg | INTRAVENOUS | Status: DC | PRN
  Administered 2014-12-17: 2000 mg via TOPICAL

## 2014-12-17 MED ORDER — DEXTROSE 5 % IV SOLN
3.0000 g | Freq: Four times a day (QID) | INTRAVENOUS | Status: AC
  Administered 2014-12-17 – 2014-12-18 (×2): 3 g via INTRAVENOUS
  Filled 2014-12-17 (×2): qty 3000

## 2014-12-17 MED ORDER — ESCITALOPRAM OXALATE 10 MG PO TABS
10.0000 mg | ORAL_TABLET | Freq: Every day | ORAL | Status: DC
  Administered 2014-12-18 – 2014-12-20 (×3): 10 mg via ORAL
  Filled 2014-12-17 (×4): qty 1

## 2014-12-17 MED ORDER — METHOCARBAMOL 500 MG PO TABS
500.0000 mg | ORAL_TABLET | Freq: Four times a day (QID) | ORAL | Status: DC | PRN
Start: 2014-12-17 — End: 2014-12-20
  Administered 2014-12-17 – 2014-12-19 (×4): 500 mg via ORAL
  Filled 2014-12-17 (×4): qty 1

## 2014-12-17 MED ORDER — HYDROMORPHONE HCL 1 MG/ML IJ SOLN
0.2500 mg | INTRAMUSCULAR | Status: DC | PRN
  Administered 2014-12-17: 1 mg via INTRAVENOUS

## 2014-12-17 MED ORDER — DIPHENHYDRAMINE HCL 12.5 MG/5ML PO ELIX: 12.5000 mg | ORAL_SOLUTION | ORAL | Status: DC | PRN

## 2014-12-17 MED ORDER — BUPIVACAINE LIPOSOME 1.3 % IJ SUSP
INTRAMUSCULAR | Status: DC | PRN
  Administered 2014-12-17: 20 mL

## 2014-12-17 MED ORDER — PHENOL 1.4 % MT LIQD: 1.0000 | OROMUCOSAL | Status: DC | PRN

## 2014-12-17 MED ORDER — HYDROMORPHONE HCL 1 MG/ML IJ SOLN
0.5000 mg | INTRAMUSCULAR | Status: DC | PRN
  Administered 2014-12-17 – 2014-12-18 (×4): 1 mg via INTRAVENOUS
  Filled 2014-12-17 (×4): qty 1

## 2014-12-17 MED ORDER — ACETAMINOPHEN 650 MG RE SUPP: 650.0000 mg | Freq: Four times a day (QID) | RECTAL | Status: DC | PRN

## 2014-12-17 MED ORDER — HYDROCODONE-ACETAMINOPHEN 5-325 MG PO TABS
ORAL_TABLET | ORAL | Status: AC
  Filled 2014-12-17: qty 2

## 2014-12-17 MED ORDER — HYDROCHLOROTHIAZIDE 25 MG PO TABS
25.0000 mg | ORAL_TABLET | Freq: Every day | ORAL | Status: DC
  Administered 2014-12-20: 25 mg via ORAL
  Filled 2014-12-17 (×2): qty 1

## 2014-12-17 MED ORDER — ALUM & MAG HYDROXIDE-SIMETH 200-200-20 MG/5ML PO SUSP: 30.0000 mL | ORAL | Status: DC | PRN

## 2014-12-17 SURGICAL SUPPLY — 70 items
APL SKNCLS STERI-STRIP NONHPOA (GAUZE/BANDAGES/DRESSINGS) ×1
BAG DECANTER FOR FLEXI CONT (MISCELLANEOUS) ×2 IMPLANT
BANDAGE ELASTIC 4 VELCRO ST LF (GAUZE/BANDAGES/DRESSINGS) ×2 IMPLANT
BANDAGE ELASTIC 6 VELCRO ST LF (GAUZE/BANDAGES/DRESSINGS) ×1 IMPLANT
BANDAGE ESMARK 6X9 LF (GAUZE/BANDAGES/DRESSINGS) ×1 IMPLANT
BENZOIN TINCTURE PRP APPL 2/3 (GAUZE/BANDAGES/DRESSINGS) ×2 IMPLANT
BLADE SAGITTAL 25.0X1.19X90 (BLADE) IMPLANT
BLADE SAW SGTL 13.0X1.19X90.0M (BLADE) IMPLANT
BLADE SURG ROTATE 9660 (MISCELLANEOUS) IMPLANT
BNDG CMPR 9X6 STRL LF SNTH (GAUZE/BANDAGES/DRESSINGS) ×1
BNDG CMPR MED 10X6 ELC LF (GAUZE/BANDAGES/DRESSINGS) ×1
BNDG ELASTIC 6X10 VLCR STRL LF (GAUZE/BANDAGES/DRESSINGS) ×2 IMPLANT
BNDG ESMARK 6X9 LF (GAUZE/BANDAGES/DRESSINGS) ×2
BNDG GAUZE ELAST 4 BULKY (GAUZE/BANDAGES/DRESSINGS) ×4 IMPLANT
BOWL SMART MIX CTS (DISPOSABLE) ×2 IMPLANT
CAP KNEE TOTAL 3 SIGMA ×1 IMPLANT
CEMENT HV SMART SET (Cement) ×4 IMPLANT
CLSR STERI-STRIP ANTIMIC 1/2X4 (GAUZE/BANDAGES/DRESSINGS) ×1 IMPLANT
COVER SURGICAL LIGHT HANDLE (MISCELLANEOUS) ×2 IMPLANT
CUFF TOURNIQUET SINGLE 34IN LL (TOURNIQUET CUFF) ×2 IMPLANT
CUFF TOURNIQUET SINGLE 44IN (TOURNIQUET CUFF) IMPLANT
DRAPE EXTREMITY T 121X128X90 (DRAPE) ×2 IMPLANT
DRAPE IMP U-DRAPE 54X76 (DRAPES) ×2 IMPLANT
DRAPE PROXIMA HALF (DRAPES) ×2 IMPLANT
DRAPE U-SHAPE 47X51 STRL (DRAPES) ×2 IMPLANT
DRSG ADAPTIC 3X8 NADH LF (GAUZE/BANDAGES/DRESSINGS) ×2 IMPLANT
DRSG PAD ABDOMINAL 8X10 ST (GAUZE/BANDAGES/DRESSINGS) ×3 IMPLANT
DURAPREP 26ML APPLICATOR (WOUND CARE) ×2 IMPLANT
ELECT REM PT RETURN 9FT ADLT (ELECTROSURGICAL) ×2
ELECTRODE REM PT RTRN 9FT ADLT (ELECTROSURGICAL) ×1 IMPLANT
GAUZE SPONGE 4X4 12PLY STRL (GAUZE/BANDAGES/DRESSINGS) ×2 IMPLANT
GLOVE BIO SURGEON STRL SZ8 (GLOVE) ×4 IMPLANT
GLOVE BIOGEL PI IND STRL 8 (GLOVE) ×2 IMPLANT
GLOVE BIOGEL PI INDICATOR 8 (GLOVE) ×2
GOWN STRL REUS W/ TWL LRG LVL3 (GOWN DISPOSABLE) ×1 IMPLANT
GOWN STRL REUS W/ TWL XL LVL3 (GOWN DISPOSABLE) ×2 IMPLANT
GOWN STRL REUS W/TWL LRG LVL3 (GOWN DISPOSABLE) ×2
GOWN STRL REUS W/TWL XL LVL3 (GOWN DISPOSABLE) ×4
HANDPIECE INTERPULSE COAX TIP (DISPOSABLE) ×2
HOOD PEEL AWAY FACE SHEILD DIS (HOOD) ×4 IMPLANT
IMMOBILIZER KNEE 20 (SOFTGOODS) IMPLANT
IMMOBILIZER KNEE 22 UNIV (SOFTGOODS) ×2 IMPLANT
IMMOBILIZER KNEE 24 THIGH 36 (MISCELLANEOUS) IMPLANT
IMMOBILIZER KNEE 24 UNIV (MISCELLANEOUS)
KIT BASIN OR (CUSTOM PROCEDURE TRAY) ×2 IMPLANT
KIT ROOM TURNOVER OR (KITS) ×2 IMPLANT
MANIFOLD NEPTUNE II (INSTRUMENTS) ×2 IMPLANT
NDL HYPO 21X1 ECLIPSE (NEEDLE) ×1 IMPLANT
NEEDLE HYPO 21X1 ECLIPSE (NEEDLE) ×2 IMPLANT
NS IRRIG 1000ML POUR BTL (IV SOLUTION) ×2 IMPLANT
PACK TOTAL JOINT (CUSTOM PROCEDURE TRAY) ×2 IMPLANT
PACK UNIVERSAL I (CUSTOM PROCEDURE TRAY) ×2 IMPLANT
PAD ARMBOARD 7.5X6 YLW CONV (MISCELLANEOUS) ×4 IMPLANT
SET HNDPC FAN SPRY TIP SCT (DISPOSABLE) ×1 IMPLANT
SPONGE GAUZE 4X4 12PLY STER LF (GAUZE/BANDAGES/DRESSINGS) ×1 IMPLANT
STAPLER VISISTAT 35W (STAPLE) IMPLANT
STRIP CLOSURE SKIN 1/2X4 (GAUZE/BANDAGES/DRESSINGS) ×2 IMPLANT
SUCTION FRAZIER TIP 10 FR DISP (SUCTIONS) IMPLANT
SUT MNCRL AB 3-0 PS2 18 (SUTURE) IMPLANT
SUT VIC AB 0 CT1 27 (SUTURE) ×4
SUT VIC AB 0 CT1 27XBRD ANBCTR (SUTURE) ×2 IMPLANT
SUT VIC AB 2-0 CT1 27 (SUTURE) ×4
SUT VIC AB 2-0 CT1 TAPERPNT 27 (SUTURE) ×2 IMPLANT
SUT VLOC 180 0 24IN GS25 (SUTURE) ×2 IMPLANT
SYR 50ML LL SCALE MARK (SYRINGE) ×2 IMPLANT
TOWEL OR 17X24 6PK STRL BLUE (TOWEL DISPOSABLE) ×2 IMPLANT
TOWEL OR 17X26 10 PK STRL BLUE (TOWEL DISPOSABLE) ×2 IMPLANT
TRAY FOLEY CATH 14FR (SET/KITS/TRAYS/PACK) IMPLANT
UPCHARGE REV TRAY MBT KNEE ×1 IMPLANT
WATER STERILE IRR 1000ML POUR (IV SOLUTION) ×4 IMPLANT

## 2014-12-17 NOTE — Anesthesia Preprocedure Evaluation (Signed)
Anesthesia Evaluation  Patient identified by MRN, date of birth, ID band Patient awake    Reviewed: Allergy & Precautions, NPO status , Patient's Chart, lab work & pertinent test results, reviewed documented beta blocker date and time   History of Anesthesia Complications Negative for: history of anesthetic complications  Airway Mallampati: II  TM Distance: >3 FB Neck ROM: Full    Dental  (+) Teeth Intact   Pulmonary neg shortness of breath, sleep apnea and Continuous Positive Airway Pressure Ventilation , neg COPD breath sounds clear to auscultation        Cardiovascular hypertension, Pt. on medications - angina- Past MI and - CHF + dysrhythmias Atrial Fibrillation Rhythm:Irregular     Neuro/Psych PSYCHIATRIC DISORDERS Depression negative neurological ROS     GI/Hepatic negative GI ROS, Neg liver ROS,   Endo/Other  Morbid obesity  Renal/GU negative Renal ROS     Musculoskeletal  (+) Arthritis -,   Abdominal   Peds  Hematology  (+) anemia ,   Anesthesia Other Findings   Reproductive/Obstetrics                             Anesthesia Physical Anesthesia Plan  ASA: III  Anesthesia Plan: General and Regional   Post-op Pain Management:    Induction: Intravenous  Airway Management Planned: LMA  Additional Equipment: None  Intra-op Plan:   Post-operative Plan: Extubation in OR  Informed Consent: I have reviewed the patients History and Physical, chart, labs and discussed the procedure including the risks, benefits and alternatives for the proposed anesthesia with the patient or authorized representative who has indicated his/her understanding and acceptance.   Dental advisory given  Plan Discussed with: CRNA and Surgeon  Anesthesia Plan Comments:         Anesthesia Quick Evaluation

## 2014-12-17 NOTE — Op Note (Signed)
PREOP DIAGNOSIS: DJD RIGHT KNEE POSTOP DIAGNOSIS: same PROCEDURE: RIGHT TKR ANESTHESIA: General and block ATTENDING SURGEON: Maximilien Hayashi G ASSISTANT: Loni Dolly PA  INDICATIONS FOR PROCEDURE: Eric Mayer is a 74 y.o. male who has struggled for a long time with pain due to degenerative arthritis of the right knee.  The patient has failed many conservative non-operative measures and at this point has pain which limits the ability to sleep and walk.  The patient is offered total knee replacement.  Informed operative consent was obtained after discussion of possible risks of anesthesia, infection, neurovascular injury, DVT, and death.  The importance of the post-operative rehabilitation protocol to optimize result was stressed extensively with the patient.  SUMMARY OF FINDINGS AND PROCEDURE:  KOLTIN WEHMEYER was taken to the operative suite where under the above anesthesia a right knee replacement was performed.  There were advanced degenerative changes and the bone quality was fair.  We used the DePuy LCS system and placed size large femur, 5 MBT tibia, 41 mm all polyethylene patella, and a size 10 mm spacer.  Loni Dolly PA-C assisted throughout and was invaluable to the completion of the case in that he helped retract and maintain exposure while I placed components.  He also helped close thereby minimizing OR time.  The patient was admitted for appropriate post-op care to include perioperative antibiotics and mechanical and pharmacologic measures for DVT prophylaxis.  DESCRIPTION OF PROCEDURE:  Eric Mayer was taken to the operative suite where the above anesthesia was applied.  The patient was positioned supine and prepped and draped in normal sterile fashion.  An appropriate time out was performed.  After the administration of kefzol pre-op antibiotic the leg was elevated and exsanguinated and a tourniquet inflated. A standard longitudinal incision was made on the anterior knee.   Dissection was carried down to the extensor mechanism.  All appropriate anti-infective measures were used including the pre-operative antibiotic, betadine impregnated drape, and closed hooded exhaust systems for each member of the surgical team.  A medial parapatellar incision was made in the extensor mechanism and the knee cap flipped and the knee flexed.  Some residual meniscal tissues were removed along with any remaining ACL/PCL tissue.  A guide was placed on the tibia and a flat cut was made on it's superior surface.  An intramedullary guide was placed in the femur and was utilized to make anterior and posterior cuts creating an appropriate flexion gap.  A second intramedullary guide was placed in the femur to make a distal cut properly balancing the knee with an extension gap equal to the flexion gap.  The three bones sized to the above mentioned sizes and the appropriate guides were placed and utilized.  A trial reduction was done and the knee easily came to full extension and the patella tracked well on flexion.  The trial components were removed and all bones were cleaned with pulsatile lavage and then dried thoroughly.  Cement was mixed and was pressurized onto the bones followed by placement of the aforementioned components.  Excess cement was trimmed and pressure was held on the components until the cement had hardened.  The tourniquet was deflated and a small amount of bleeding was controlled with cautery and pressure.  The knee was irrigated thoroughly.  The extensor mechanism was re-approximated with V-loc suture in running fashion.  The knee was flexed and the repair was solid.  The subcutaneous tissues were re-approximated with #0 and #2-0 vicryl and the skin closed with  a subcuticular stitch and steristrips.  A sterile dressing was applied.  Intraoperative fluids, EBL, and tourniquet time can be obtained from anesthesia records.  DISPOSITION:  The patient was taken to recovery room in stable  condition and admitted for appropriate post-op care to include peri-operative antibiotic and DVT prophylaxis with mechanical and pharmacologic measures.  Terasa Orsini G 12/17/2014, 3:05 PM

## 2014-12-17 NOTE — Progress Notes (Signed)
Orthopedic Tech Progress Note Patient Details:  Eric Mayer [birth year] [id number] Applied CPM to RLE.  Applied OHF with trapeze to pt.'s bed. CPM Right Knee CPM Right Knee: On Right Knee Flexion (Degrees): 90 Right Knee Extension (Degrees): 0   Darrol Poke 12/17/2014, 4:11 PM

## 2014-12-17 NOTE — Transfer of Care (Signed)
Immediate Anesthesia Transfer of Care Note  Patient: Eric Mayer  Procedure(s) Performed: Procedure(s): TOTAL KNEE ARTHROPLASTY (Right)  Patient Location: PACU  Anesthesia Type:GA combined with regional for post-op pain  Level of Consciousness: awake, alert  and oriented  Airway & Oxygen Therapy: Patient Spontanous Breathing and Patient connected to nasal cannula oxygen  Post-op Assessment: Report given to RN, Post -op Vital signs reviewed and stable and Patient moving all extremities X 4  Post vital signs: Reviewed and stable  Last Vitals:  Filed Vitals:   12/17/14 1325  BP:   Pulse: 60  Temp:   Resp: 17   BP 144/57, HR 68, RR 17, Sats 93% on 2L [place]   Complications: No apparent anesthesia complications

## 2014-12-17 NOTE — Progress Notes (Signed)
Patient refused CPAP for the night  

## 2014-12-17 NOTE — Anesthesia Procedure Notes (Addendum)
Anesthesia Regional Block:  Adductor canal block  Pre-Anesthetic Checklist: ,, timeout performed, Correct Patient, Correct Site, Correct Laterality, Correct Procedure, Correct Position, site marked, Risks and benefits discussed,  Surgical consent,  Pre-op evaluation,  At surgeon's request and post-op pain management  Laterality: Lower and Right  Prep: chloraprep       Needles:  Injection technique: Single-shot  Needle Type: Echogenic Stimulator Needle          Additional Needles:  Procedures: ultrasound guided (picture in chart) Adductor canal block Narrative:  Injection made incrementally with aspirations every 5 mL.  Performed by: Personally  Anesthesiologist: MOSER, CHRIS  Additional Notes: H+P and labs reviewed, risks and benefits discussed with patient, procedure tolerated well without complications   Procedure Name: LMA Insertion Date/Time: 12/17/2014 1:43 PM Performed by: Ebbie Latus E Pre-anesthesia Checklist: Patient identified, Emergency Drugs available, Suction available, Patient being monitored and Timeout performed Patient Re-evaluated:Patient Re-evaluated prior to inductionOxygen Delivery Method: Circle system utilized Preoxygenation: Pre-oxygenation with 100% oxygen Intubation Type: IV induction Ventilation: Mask ventilation without difficulty LMA Size: 4.0 Number of attempts: 1 Placement Confirmation: positive ETCO2 and breath sounds checked- equal and bilateral Tube secured with: Tape

## 2014-12-17 NOTE — Interval H&P Note (Signed)
OK for surgery PD 

## 2014-12-18 ENCOUNTER — Encounter (HOSPITAL_COMMUNITY): Payer: Self-pay | Admitting: Orthopaedic Surgery

## 2014-12-18 LAB — BASIC METABOLIC PANEL
Anion gap: 10 (ref 5–15)
BUN: 21 mg/dL — ABNORMAL HIGH (ref 6–20)
CO2: 22 mmol/L (ref 22–32)
Calcium: 8.4 mg/dL — ABNORMAL LOW (ref 8.9–10.3)
Chloride: 108 mmol/L (ref 101–111)
Creatinine, Ser: 0.89 mg/dL (ref 0.61–1.24)
GFR calc Af Amer: >60 mL/min (ref >60)
GFR calc non Af Amer: >60 mL/min (ref >60)
Glucose, Bld: 126 mg/dL — ABNORMAL HIGH (ref 70–99)
Potassium: 4 mmol/L (ref 3.5–5.1)
Sodium: 140 mmol/L (ref 135–145)

## 2014-12-18 LAB — CBC
HCT: 34.7 % — ABNORMAL LOW (ref 39.0–52.0)
Hemoglobin: 11.3 g/dL — ABNORMAL LOW (ref 13.0–17.0)
MCH: 30.6 pg (ref 26.0–34.0)
MCHC: 32.6 g/dL (ref 30.0–36.0)
MCV: 94 fL (ref 78.0–100.0)
Platelets: 178 10*3/uL (ref 150–400)
RBC: 3.69 MIL/uL — ABNORMAL LOW (ref 4.22–5.81)
RDW: 13.1 % (ref 11.5–15.5)
WBC: 8 10*3/uL (ref 4.0–10.5)

## 2014-12-18 MED ORDER — SODIUM CHLORIDE 0.9 % IV BOLUS (SEPSIS)
500.0000 mL | Freq: Once | INTRAVENOUS | Status: AC
  Administered 2014-12-18: 500 mL via INTRAVENOUS

## 2014-12-18 NOTE — Evaluation (Signed)
Physical Therapy Evaluation Patient Details Name: Eric Mayer MRN: [id number] DOB: 12-05-1940 Today's Date: 12/18/2014   History of Present Illness  Pt is a 74 y/o M s/p R TKA.  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  Clinical Impression  Pt is s/p R TKA resulting in the deficits listed below (see PT Problem List). Pt w/ h/o LLE weakness which is limiting factor for ambulatory distance and independence.  Pt is not safe to return home at this time due to weakness of BLEs and h/o falls prior to surgery.  Pt w/ 2/5 L knee extension strength.  Therefore, pt will benefit from additional rehab at SNF to improve RLE strength and independence to be able to return to safe mod I ambulation and ADLs.  Pt and wife are agreeable to SNF upon d/c as they both do not feel the pt will be safe in the home environment, even with 24/7 support from wife.  Pt will benefit from skilled PT to increase their independence and safety with mobility to allow discharge to the venue listed below.      Follow Up Recommendations SNF;Supervision for mobility/OOB    Equipment Recommendations  None recommended by PT    Recommendations for Other Services       Precautions / Restrictions Precautions Precautions: Fall;Knee Precaution Booklet Issued: Yes (comment) Precaution Comments: Reviewed no pillow under knee Required Braces or Orthoses: Knee Immobilizer - Right Knee Immobilizer - Right: On at all times Restrictions Weight Bearing Restrictions: Yes RLE Weight Bearing: Weight bearing as tolerated      Mobility  Bed Mobility Overal bed mobility: Needs Assistance Bed Mobility: Supine to Sit     Supine to sit: Min assist     General bed mobility comments: min assist w/ managing RLE and LLE to sitting EOB.  Heavy use of bed rails and increased time w/ verbal cues for sequencing.  Transfers Overall transfer level: Needs  assistance Equipment used: Rolling walker (2 wheeled) Transfers: Sit to/from Stand Sit to Stand: Min assist;From elevated surface         General transfer comment: Pt's bed was raised so RLE could be place in minimal hip F to allow for weight bearing during sit>stand as pt's LLE has 2/5 knee extension strength.  Min assist to rise to standing and verbal cues for hand placement and to push through BLEs as able to stand upright.  Ambulation/Gait Ambulation/Gait assistance: Min assist;+2 safety/equipment (wife w/ close chair follow) Ambulation Distance (Feet): 8 Feet Assistive device: Rolling walker (2 wheeled) Gait Pattern/deviations: Step-to pattern;Shuffle;Antalgic;Trunk flexed;Decreased stride length   Gait velocity interpretation: Below normal speed for age/gender General Gait Details: verbal cues for sequencing and to stand upright.  Min assist to stand upright to prevent B knees from buckling.  Pt reports fatigue after ambulating 8 ft and requests to sit in recliner chair.  Stairs            Wheelchair Mobility    Modified Rankin (Stroke Patients Only)       Balance Overall balance assessment: Needs assistance;History of Falls Sitting-balance support: Bilateral upper extremity supported;Feet supported Sitting balance-Leahy Scale: Fair     Standing balance support: Bilateral upper extremity supported;During functional activity Standing balance-Leahy Scale: Poor  Pertinent Vitals/Pain Pain Assessment: 0-10 Pain Score: 6  Pain Location: R knee Pain Descriptors / Indicators: Aching Pain Intervention(s): Limited activity within patient's tolerance;Monitored during session;Repositioned    Home Living Family/patient expects to be discharged to:: Skilled nursing facility Living Arrangements: Spouse/significant other Available Help at Discharge: Family;Available 24 hours/day Type of Home: House Home Access: Stairs to  enter Entrance Stairs-Rails: None Entrance Stairs-Number of Steps: 1 (1 to enter home, 2 steps to enter kitchen) Home Layout: Two level (split level) Home Equipment: Bedside commode;Wheelchair - manual;Cane - single point;Crutches;Tub bench;Walker - 2 wheels (walking sticks)      Prior Function Level of Independence: Independent with assistive device(s)         Comments: walking stick when felt unstable or when going out into community     Hand Dominance   Dominant Hand: Right    Extremity/Trunk Assessment               Lower Extremity Assessment: RLE deficits/detail;LLE deficits/detail;Generalized weakness RLE Deficits / Details: weakness and limited ROM as expected s/p R TKA LLE Deficits / Details: L DF/PF, knee flexion: 5/5.  L knee extension: 2/5, L hip flexion: 3/5     Communication   Communication: No difficulties  Cognition Arousal/Alertness: Awake/alert Behavior During Therapy: WFL for tasks assessed/performed Overall Cognitive Status: Within Functional Limits for tasks assessed                      General Comments General comments (skin integrity, edema, etc.): Pt weakness in LLE is limiting factor for increased ambulatory activity and independence.  Pt's wife encourage to bring additional knee immobilizer from home to apply to LLE to decrease risk of L knee buckle and fall.    Exercises Total Joint Exercises Ankle Circles/Pumps: AROM;Both;10 reps;Supine Quad Sets: AROM;Both;10 reps;Supine Heel Slides: AROM;AAROM;10 reps;Seated Goniometric ROM: 7-112      Assessment/Plan    PT Assessment Patient needs continued PT services  PT Diagnosis Difficulty walking;Abnormality of gait;Generalized weakness;Acute pain   PT Problem List Decreased strength;Decreased range of motion;Decreased activity tolerance;Decreased balance;Decreased mobility;Decreased coordination;Decreased knowledge of use of DME;Decreased safety awareness;Decreased knowledge of  precautions;Impaired sensation;Decreased skin integrity;Pain  PT Treatment Interventions DME instruction;Gait training;Stair training;Functional mobility training;Therapeutic activities;Therapeutic exercise;Balance training;Neuromuscular re-education;Patient/family education;Wheelchair mobility training;Modalities   PT Goals (Current goals can be found in the Care Plan section) Acute Rehab PT Goals Patient Stated Goal: to go to rehab so he is more comfortable and safe when returning home.  Wife in agreement. PT Goal Formulation: With patient/family Time For Goal Achievement: 12/25/14 Potential to Achieve Goals: Good    Frequency 7X/week   Barriers to discharge Inaccessible home environment 1 step to enter home, 2 steps to kitchen, 3 steps to go upstairs for bath/shower and bed    Co-evaluation               End of Session Equipment Utilized During Treatment: Gait belt;Right knee immobilizer Activity Tolerance: Patient limited by fatigue Patient left: in chair;with call bell/phone within reach;with family/visitor present Nurse Communication: Mobility status;Precautions;Weight bearing status         Time: 9509-3267 PT Time Calculation (min) (ACUTE ONLY): 41 min   Charges:   PT Evaluation $Initial PT Evaluation Tier I: 1 Procedure PT Treatments $Gait Training: 8-22 mins $Therapeutic Exercise: 8-22 mins   PT G Codes:       Joslyn Hy PT, DPT [phone] Pager: [phone] 12/18/2014, 10:39 AM

## 2014-12-18 NOTE — Plan of Care (Signed)
Problem: Consults Goal: Diagnosis- Total Joint Replacement Primary Total Knee     

## 2014-12-18 NOTE — Progress Notes (Signed)
Subjective: 1 Day Post-Op Procedure(s) (LRB): TOTAL KNEE ARTHROPLASTY (Right)  Activity level:  wbat Diet tolerance:  ok Voiding:  ok Patient reports pain as mild and moderate.    Objective: Vital signs in last 24 hours: Temp:  [97.5 F (36.4 C)-98.4 F (36.9 C)] 98.4 F (36.9 C) (05/04 0502) Pulse Rate:  [55-74] 74 (05/04 0502) Resp:  [3-21] 14 (05/04 0502) BP: (110-144)/(46-70) 118/47 mmHg (05/04 0502) SpO2:  [95 %-99 %] 96 % (05/04 0502) Weight:  [127.461 kg (281 lb)] 127.461 kg (281 lb) (05/03 1057)  Labs:  Recent Labs  12/18/14 0615  HGB 11.3*    Recent Labs  12/18/14 0615  WBC 8.0  RBC 3.69*  HCT 34.7*  PLT 178   No results for input(s): NA, K, CL, CO2, BUN, CREATININE, GLUCOSE, CALCIUM in the last 72 hours. No results for input(s): LABPT, INR in the last 72 hours.  Physical Exam:  Neurologically intact ABD soft Neurovascular intact Sensation intact distally Intact pulses distally Dorsiflexion/Plantar flexion intact Incision: dressing C/D/I and no drainage No cellulitis present Compartment soft  Assessment/Plan:  1 Day Post-Op Procedure(s) (LRB): TOTAL KNEE ARTHROPLASTY (Right) Advance diet Up with therapy D/C IV fluids Plan for discharge tomorrow Discharge home with home health if continuing to do well and cleared by PT. Will change dressing tomorrow to Aquacel. Continue on home Pradaxa for DVT prevention. Follow up in office 2 weeks post op.    Keyli Duross, Larwance Sachs 12/18/2014, 7:52 AM

## 2014-12-18 NOTE — Evaluation (Signed)
Occupational Therapy Evaluation Patient Details Name: Eric Mayer MRN: [id number] DOB: [birth year] Today's Date: 12/18/2014    History of Present Illness Pt is a 74 y/o M s/p R TKA.  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   Clinical Impression   Pt admitted with the above diagnoses and presents with below problem list. Pt will benefit from continued acute OT to address the below listed deficits and maximize independence with BADLs prior to d/c to venue below. PTA pt was mod I with ADLs. Pt is currently min A with LB ADLs and min A +2 per PT note. Session limited by fatigue with pt having just completed SPT from recliner to bed. OT to continue to follow acutely.     Follow Up Recommendations  SNF    Equipment Recommendations  Other (comment) (TBD next venue)    Recommendations for Other Services       Precautions / Restrictions Precautions Precautions: Fall;Knee Precaution Booklet Issued: Yes (comment) Precaution Comments: Reviewed ADLs with precautions Required Braces or Orthoses: Knee Immobilizer - Right Knee Immobilizer - Right: On at all times Restrictions Weight Bearing Restrictions: Yes RLE Weight Bearing: Weight bearing as tolerated      Mobility Bed Mobility               General bed mobility comments: scooted up in bed with no physical assist using BUE pushing into mattress. Pt declined further bed mobility tasks due to fatigue.  Transfers                 General transfer comment: Pt declined due to fatigue.    Balance                                            ADL Overall ADL's : Needs assistance/impaired Eating/Feeding: Set up;Sitting   Grooming: Set up;Sitting   Upper Body Bathing: Set up;Sitting   Lower Body Bathing: Minimal assistance;With adaptive equipment;Sit to/from stand   Upper Body Dressing : Set up;Sitting   Lower  Body Dressing: Minimal assistance;With adaptive equipment;Sit to/from stand   Toilet Transfer: Minimal assistance;+2 for physical assistance;BSC;RW;Stand-pivot   Toileting- Clothing Manipulation and Hygiene: Minimal assistance;Sit to/from stand   Tub/ Shower Transfer: Minimal assistance;+2 for physical assistance;Stand-pivot;3 in 1;Rolling walker   Functional mobility during ADLs: Minimal assistance;+2 for physical assistance;Rolling walker General ADL Comments: Limited eval due to fatigue. Pt had just completed SPT from recliner to bed. Pt completed scooting up in bed with HOB partially elevated due to neck pain. Pt able to unload weight to advance up in bed with BUE into mattress, Reviewed ADL education with pt and spouse and discussed SNF recommendation. Pt must navigate 3 steps to access bedroom/bathroom. Per PT pt is min A for transfers and ambulated a few steps with +2 min physical assist.      Vision     Perception     Praxis      Pertinent Vitals/Pain Pain Assessment: 0-10 Pain Score: 5  Pain Location: Rt knee Pain Descriptors / Indicators: Aching Pain Intervention(s): Limited activity within patient's tolerance;Monitored during session;Repositioned     Hand Dominance Right   Extremity/Trunk Assessment Upper Extremity Assessment Upper Extremity Assessment: Overall WFL for tasks assessed   Lower Extremity Assessment Lower Extremity Assessment: Defer to PT evaluation  Communication Communication Communication: No difficulties   Cognition Arousal/Alertness: Awake/alert Behavior During Therapy: WFL for tasks assessed/performed Overall Cognitive Status: Within Functional Limits for tasks assessed                     General Comments       Exercises       Shoulder Instructions      Home Living Family/patient expects to be discharged to:: Other (Comment) (rehab) Living Arrangements: Spouse/significant other Available Help at Discharge:  Family;Available 24 hours/day Type of Home: House Home Access: Stairs to enter [employer] of Steps: 1 Entrance Stairs-Rails: None Home Layout: Two level Alternate Level Stairs-Number of Steps: 3 Alternate Level Stairs-Rails: Can reach both           Home Equipment: Bedside commode;Wheelchair - manual;Cane - single point;Crutches;Tub bench;Walker - 2 wheels   Additional Comments: Pt reports BSC was for his mother in law and is too small for him. Same with rw.      Prior Functioning/Environment Level of Independence: Independent with assistive device(s)        Comments: walking stick when felt unstable or when going out into community    OT Diagnosis: Acute pain   OT Problem List: Decreased activity tolerance;Impaired balance (sitting and/or standing);Decreased knowledge of use of DME or AE;Decreased knowledge of precautions;Pain;Obesity   OT Treatment/Interventions: Self-care/ADL training;DME and/or AE instruction;Therapeutic activities;Patient/family education;Balance training    OT Goals(Current goals can be found in the care plan section) Acute Rehab OT Goals Patient Stated Goal: to go to rehab so he is more comfortable and safe when returning home.  Wife in agreement. OT Goal Formulation: With patient/family Time For Goal Achievement: 12/25/14 Potential to Achieve Goals: Good ADL Goals Pt Will Perform Lower Body Dressing: with modified independence;with adaptive equipment;sit to/from stand Pt Will Transfer to Toilet: ambulating;bedside commode;with min guard assist Pt Will Perform Toileting - Clothing Manipulation and hygiene: with modified independence;sit to/from stand Pt Will Perform Tub/Shower Transfer: Tub transfer;with min guard assist;ambulating;3 in 1;rolling walker Additional ADL Goal #1: Pt will complete bed mobilty at mod I level to prepare for OOB ADLs.  OT Frequency: Min 2X/week   Barriers to D/C: Inaccessible home environment  3 steps to  access bedroom/bathroom       Co-evaluation              End of Session Equipment Utilized During Treatment: Right knee immobilizer  Activity Tolerance: Patient limited by fatigue Patient left: in bed;with call bell/phone within reach;with family/visitor present   Time: 9390-3009 OT Time Calculation (min): 14 min Charges:  OT General Charges $OT Visit: 1 Procedure OT Evaluation $Initial OT Evaluation Tier I: 1 Procedure G-Codes:    Hortencia Pilar 2014-12-29, 2:59 PM

## 2014-12-18 NOTE — Progress Notes (Signed)
RT Note:  Spoke with patient regarding CPAP.  Patient states his wife is bringing his home  CPAP tonight.  Encouraged patient to call for Respiratory if he needs to be set up on CPAP or does not receive his equipment from home.

## 2014-12-18 NOTE — Clinical Social Work Note (Signed)
Clinical Social Work Assessment  Patient Details  Name: Eric Mayer MRN: [id number] Date of Birth: 1941-02-18  Date of referral:  12/18/14               Reason for consult:  Facility Placement                Permission sought to share information with:  Family Supports Permission granted to share information::  Yes, Verbal Permission Granted  Name::     Eric Mayer  Agency::     Relationship::  Wife  Contact Information:  [phone]  Housing/Transportation Living arrangements for the past 2 months:  Single Family Home Source of Information:  Patient Patient Interpreter Needed:  None Criminal Activity/Legal Involvement Pertinent to Current Situation/Hospitalization:  No - Comment as needed Significant Relationships:  Spouse Lives with:  Spouse Do you feel safe going back to the place where you live?  Yes Need for family participation in patient care:  No (Coment)  Care giving concerns:  Patient nor patient's wife expressed concerns regarding patient's discharge.   Social Worker assessment / plan:  Patient and patient's wife expressed understanding of PT recommendation for SNF placement at time of discharge. Per patient, patient and patient's wife have no preference to which SNF they would prefer. CSW to initiate [facility] search. CSW to continue to follow and assist with discharge planning needs.  Employment status:  Retired Nurse, adult PT Recommendations:  [place] / Referral to community resources:  [place]  Patient/Family's Response to care:  Patient and patient's wife understanding and agreeable to CSW plan of care.  Patient/Family's Understanding of and Emotional Response to Diagnosis, Current Treatment, and Prognosis:  Patient and patient's wife understanding and agreeable to CSW plan of care.  Emotional Assessment Appearance:  Appears younger than stated  age Attitude/Demeanor/Rapport:  Other (Patient pleasant during assessment.) Affect (typically observed):  Accepting, Happy, Hopeful, Pleasant, Appropriate Orientation:  Oriented to Self, Oriented to Place, Oriented to  Time, Oriented to Situation Alcohol / Substance use:  Not Applicable Psych involvement (Current and /or in the community):  No (Comment) (Not appropriate on this admission.)  Discharge Needs  Concerns to be addressed:  No discharge needs identified Readmission within the last 30 days:  No Current discharge risk:  None Barriers to Discharge:  No Barriers Identified   Caroline Sauger, LCSW 12/18/2014, 2:53 PM [phone]

## 2014-12-19 LAB — CBC
HCT: 30.8 % — ABNORMAL LOW (ref 39.0–52.0)
Hemoglobin: 10 g/dL — ABNORMAL LOW (ref 13.0–17.0)
MCH: 30.6 pg (ref 26.0–34.0)
MCHC: 32.5 g/dL (ref 30.0–36.0)
MCV: 94.2 fL (ref 78.0–100.0)
Platelets: 141 10*3/uL — ABNORMAL LOW (ref 150–400)
RBC: 3.27 MIL/uL — ABNORMAL LOW (ref 4.22–5.81)
RDW: 13.2 % (ref 11.5–15.5)
WBC: 7.6 10*3/uL (ref 4.0–10.5)

## 2014-12-19 NOTE — Progress Notes (Signed)
Occupational Therapy Treatment Patient Details Name: Eric Mayer MRN: [id number] DOB: 1940-12-28 Today's Date: 12/19/2014    History of present illness Pt is a 74 y/o M s/p R TKA.  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   OT comments  Patient continues to be limited by fatigue and LLE weakness. Continue to recommend SNF rehab. OT will follow.  Follow Up Recommendations  SNF    Equipment Recommendations  Other (comment) (TBD next venue)    Recommendations for Other Services      Precautions / Restrictions Precautions Precautions: Fall;Knee Required Braces or Orthoses: Knee Immobilizer - Right Knee Immobilizer - Right: On at all times Restrictions Weight Bearing Restrictions: Yes RLE Weight Bearing: Weight bearing as tolerated       Mobility Bed Mobility Overal bed mobility: Needs Assistance Bed Mobility: Supine to Sit     Supine to sit: Min assist     General bed mobility comments: heavy use of bed rails and increased time. Also needed assistance with LLE.  Transfers Overall transfer level: Needs assistance Equipment used: Rolling walker (2 wheeled) Transfers: Sit to/from [facility] Sit to Stand: Min assist;From elevated surface Stand pivot transfers: Min assist;+2 safety/equipment;From elevated surface            Balance                                   ADL Overall ADL's : Needs assistance/impaired                         Toilet Transfer: Minimal assistance;Stand-pivot;BSC;RW   Toileting- Clothing Manipulation and Hygiene: Maximal assistance;Sit to/from stand       Functional mobility during ADLs: Minimal assistance;+2 for safety/equipment;Rolling walker (transfers only) General ADL Comments: Patient reports he toileted using BSC next to bed earlier today with +2 assistance by nursing. He declined need to use BSC this  morning. Offered LB dressing education, patient declined. He was willing to work on standing. Dependent to don R KI in bed. Performed bed mobility with min A and increased time. From elevated bed, patient performed sit to stand with min A. Initially lost his balance posteriorly in standing. Patient was able to take side steps and backwards steps with RW and min A. Reported fatigue, but did not want to take a seated rest break. Agreeable to transfer to recliner. Min A pivotal steps with RW to recliner. Decreased eccentric control stand to sit requiring mod A to safely descend. Patient left up in chair at end of session. Wife brought in L KI from home to use during ambulation due to LLE weakness. Patient and wife said that "they are working on getting me to a rehab." OT will continue to follow.      Vision                     Perception     Praxis      Cognition   Behavior During Therapy: [facility] for tasks assessed/performed Overall Cognitive Status: Within Functional Limits for tasks assessed                       Extremity/Trunk Assessment               Exercises  Shoulder Instructions       General Comments      Pertinent Vitals/ Pain       Pain Assessment: 0-10 Pain Score: 3  Pain Location: R knee Pain Descriptors / Indicators: Aching;Sore Pain Intervention(s): Limited activity within patient's tolerance  Home Living                                          Prior Functioning/Environment              Frequency Min 2X/week     Progress Toward Goals  OT Goals(current goals can now be found in the care plan section)  Progress towards OT goals: Progressing toward goals  Acute Rehab OT Goals Patient Stated Goal: to go to rehab so he is more comfortable and safe when returning home.  Wife in agreement.  Plan Discharge plan remains appropriate    Co-evaluation                 End of Session Equipment Utilized During  Treatment: Right knee immobilizer CPM Right Knee CPM Right Knee: On   Activity Tolerance Patient limited by fatigue   Patient Left in chair;with call bell/phone within reach;with family/visitor present   Nurse Communication          Time: 0940-7680 OT Time Calculation (min): 18 min  Charges: OT General Charges $OT Visit: 1 Procedure OT Treatments $Therapeutic Activity: 8-22 mins  Isaiahs Chancy A 12/19/2014, 11:27 AM

## 2014-12-19 NOTE — Anesthesia Postprocedure Evaluation (Addendum)
  Anesthesia Post-op Note  Patient: Eric Mayer  Procedure(s) Performed: Procedure(s): TOTAL KNEE ARTHROPLASTY (Right)  Patient Location: PACU  Anesthesia Type:General and Regional  Level of Consciousness: awake  Airway and Oxygen Therapy: Patient Spontanous Breathing  Post-op Pain: mild  Post-op Assessment: Post-op Vital signs reviewed, Patient's Cardiovascular Status Stable, Respiratory Function Stable, Patent Airway, No signs of Nausea or vomiting and Pain level controlled  Post-op Vital Signs: Reviewed and stable  Last Vitals:  Filed Vitals:   12/19/14 0508  BP: 122/47  Pulse: 84  Temp: 37.1 C  Resp: 18    Complications: No apparent anesthesia complications

## 2014-12-19 NOTE — Progress Notes (Signed)
CPAP at patient beside on ready for use.  Patient places on himself when ready for bed.

## 2014-12-19 NOTE — Progress Notes (Signed)
Orthopedic Tech Progress Note Patient Details:  Eric Mayer 1940-09-23 [id number] On cpm at 7:50 pm 0-70 Patient ID: [name], male   DOB: 1940-11-24, 74 y.o.   MRN: [id number]   [name] 12/19/2014, 7:54 PM

## 2014-12-19 NOTE — Progress Notes (Signed)
Subjective: 2 Days Post-Op Procedure(s) (LRB): TOTAL KNEE ARTHROPLASTY (Right)  Activity level:  wbat Diet tolerance:  ok Voiding:  ok Patient reports pain as mild.    Objective: Vital signs in last 24 hours: Temp:  [97.9 F (36.6 C)-100 F (37.8 C)] 98.7 F (37.1 C) (05/05 0508) Pulse Rate:  [68-88] 84 (05/05 0508) Resp:  [18] 18 (05/05 0508) BP: (99-127)/(44-47) 122/47 mmHg (05/05 0508) SpO2:  [93 %-96 %] 94 % (05/05 0508)  Labs:  Recent Labs  12/18/14 0615 12/19/14 0436  HGB 11.3* 10.0*    Recent Labs  12/18/14 0615 12/19/14 0436  WBC 8.0 7.6  RBC 3.69* 3.27*  HCT 34.7* 30.8*  PLT 178 141*    Recent Labs  12/18/14 0615  NA 140  K 4.0  CL 108  CO2 22  BUN 21*  CREATININE 0.89  GLUCOSE 126*  CALCIUM 8.4*   No results for input(s): LABPT, INR in the last 72 hours.  Physical Exam:  Neurologically intact ABD soft Neurovascular intact Sensation intact distally Intact pulses distally Dorsiflexion/Plantar flexion intact Incision: dressing C/D/I and no drainage No cellulitis present Compartment soft  Assessment/Plan:  2 Days Post-Op Procedure(s) (LRB): TOTAL KNEE ARTHROPLASTY (Right) Advance diet Up with therapy Discharge to SNF [facility] tomorrow. Dressing changed to aquacel today. Follow up in office to weeks post op. 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   Camila [place], Larwance Sachs 12/19/2014, 1:30 PM

## 2014-12-19 NOTE — Progress Notes (Signed)
Physical Therapy Treatment Patient Details Name: FITZ MATSUO MRN: [id number] DOB: 09/28/1940 Today's Date: 12/19/2014    History of Present Illness Pt is a 74 y/o M s/p R TKA.  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    PT Comments    Pt able to progress ambulation this session without B KIs per pt stating that MD stated pt did not have to wear KIs unless pt felt necessary. No knee buckling noted during ambulation. Pt would benefit from continued PT to increase functional independence and safety. Continue to recommend SNF for ongoing PT.  Follow Up Recommendations  SNF;Supervision for mobility/OOB     Equipment Recommendations  None recommended by PT    Recommendations for Other Services       Precautions / Restrictions Precautions Precautions: Knee;Fall Required Braces or Orthoses: Knee Immobilizer - Right Knee Immobilizer - Right: On at all times Restrictions Weight Bearing Restrictions: Yes RLE Weight Bearing: Weight bearing as tolerated    Mobility  Bed Mobility Overal bed mobility: Needs Assistance Bed Mobility: Supine to Sit     Supine to sit: Min assist     General bed mobility comments: Pt in chair before and after.  Transfers Overall transfer level: Needs assistance Equipment used: Rolling walker (2 wheeled) Transfers: Sit to/from Stand Sit to Stand: Min assist;+2 physical assistance Stand pivot transfers: Min assist;+2 safety/equipment;From elevated surface       General transfer comment: Min A +2 to power up into standing.  Ambulation/Gait Ambulation/Gait assistance: Min assist;+2 safety/equipment Ambulation Distance (Feet): 35 Feet Assistive device: Rolling walker (2 wheeled) Gait Pattern/deviations: Step-to pattern;Trunk flexed;Decreased stride length   Gait velocity interpretation: Below normal speed for age/gender General Gait Details: Cues for upright  posture. Able to ambulate without B KIs. No knee buckling noted.   Stairs            Wheelchair Mobility    Modified Rankin (Stroke Patients Only)       Balance                                    Cognition Arousal/Alertness: Awake/alert Behavior During Therapy: WFL for tasks assessed/performed Overall Cognitive Status: Within Functional Limits for tasks assessed                      Exercises Total Joint Exercises Ankle Circles/Pumps: AROM;Both;10 reps Quad Sets: AROM;Both;10 reps Heel Slides: AAROM;Right;10 reps Hip ABduction/ADduction: AAROM;Right;10 reps Straight Leg Raises: AAROM;Right;10 reps    General Comments        Pertinent Vitals/Pain Pain Assessment: 0-10 Pain Score: 7  Pain Location: R knee after ambulation Pain Descriptors / Indicators: Sore;Moaning;Grimacing Pain Intervention(s): Monitored during session;Patient requesting pain meds-RN notified    Home Living                      Prior Function            PT Goals (current goals can now be found in the care plan section) Acute Rehab PT Goals Patient Stated Goal: to go to rehab so he is more comfortable and safe when returning home.  Wife in agreement. Progress towards PT goals: Progressing toward goals    Frequency  7X/week    PT Plan Current plan remains appropriate    Co-evaluation  End of Session Equipment Utilized During Treatment: Gait belt Activity Tolerance: Patient limited by fatigue Patient left: in chair;with call bell/phone within reach;with nursing/sitter in room;with family/visitor present     Time: 8756-4332 PT Time Calculation (min) (ACUTE ONLY): 31 min  Charges:                       G CodesRubye Oaks, [place] 12/19/2014, 2:45 PM

## 2014-12-19 NOTE — Addendum Note (Signed)
Addendum  created 12/19/14 1519 by Laurie Panda, MD   Modules edited: Notes Section   Notes Section:  File: [id number]

## 2014-12-20 LAB — CBC
HCT: 28.4 % — ABNORMAL LOW (ref 39.0–52.0)
Hemoglobin: 9.4 g/dL — ABNORMAL LOW (ref 13.0–17.0)
MCH: 31.1 pg (ref 26.0–34.0)
MCHC: 33.1 g/dL (ref 30.0–36.0)
MCV: 94 fL (ref 78.0–100.0)
Platelets: 140 10*3/uL — ABNORMAL LOW (ref 150–400)
RBC: 3.02 MIL/uL — ABNORMAL LOW (ref 4.22–5.81)
RDW: 13.1 % (ref 11.5–15.5)
WBC: 8.7 10*3/uL (ref 4.0–10.5)

## 2014-12-20 MED ORDER — METHOCARBAMOL 500 MG PO TABS: 500.0000 mg | ORAL_TABLET | Freq: Four times a day (QID) | ORAL | Status: DC | PRN

## 2014-12-20 MED ORDER — DABIGATRAN ETEXILATE MESYLATE 150 MG PO CAPS: 150.0000 mg | ORAL_CAPSULE | Freq: Two times a day (BID) | ORAL | Status: DC

## 2014-12-20 MED ORDER — HYDROCODONE-ACETAMINOPHEN 5-325 MG PO TABS: 1.0000 | ORAL_TABLET | ORAL | Status: DC | PRN

## 2014-12-20 NOTE — Progress Notes (Signed)
Physical Therapy Treatment Patient Details Name: Eric Mayer MRN: [id number] DOB: Dec 16, 1940 Today's Date: 12/20/2014    History of Present Illness Pt is a 74 y/o M s/p R TKA.  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    PT Comments    Pt ambulated 55 ft this session w/ min assist and min assist +2 for powering up sit>stand.  Pt fatigues quickly when ambulating and will benefit from skilled PT to increase endurance and independence.  Follow Up Recommendations  SNF;Supervision for mobility/OOB     Equipment Recommendations  None recommended by PT    Recommendations for Other Services       Precautions / Restrictions Precautions Precautions: Knee;Fall Precaution Comments: Pt recalled no pillow under knee Required Braces or Orthoses: Knee Immobilizer - Right Knee Immobilizer - Right: Other (comment) (no specified in order) Restrictions Weight Bearing Restrictions: Yes RLE Weight Bearing: Weight bearing as tolerated    Mobility  Bed Mobility Overal bed mobility: Modified Independent Bed Mobility: Supine to Sit     Supine to sit: Modified independent (Device/Increase time)     General bed mobility comments: Pt performs leg hook technique but requires increased time and mod use of bed rails to sit EOB  Transfers Overall transfer level: Needs assistance Equipment used: Rolling walker (2 wheeled) Transfers: Sit to/from Stand Sit to Stand: From elevated surface;+2 physical assistance;Min assist         General transfer comment: Assist to power up into standing w/ verbal cues to stand upright and look forward  Ambulation/Gait Ambulation/Gait assistance: Min assist;+2 safety/equipment Ambulation Distance (Feet): 55 Feet Assistive device: Rolling walker (2 wheeled) Gait Pattern/deviations: Step-to pattern;Antalgic;Trunk flexed   Gait velocity interpretation: Below normal speed for  age/gender General Gait Details: Cues for upright posture which pt has difficulty maintaining.  Ambulated w/o KI, no knee buckling noted.  Pt fatigues after ambulating 55 ft and requests to sit down.  close chair follow.   Stairs            Wheelchair Mobility    Modified Rankin (Stroke Patients Only)       Balance Overall balance assessment: Needs assistance Sitting-balance support: Bilateral upper extremity supported;Feet supported Sitting balance-Leahy Scale: Fair     Standing balance support: Bilateral upper extremity supported;During functional activity Standing balance-Leahy Scale: Fair                      Cognition Arousal/Alertness: Awake/alert Behavior During Therapy: WFL for tasks assessed/performed Overall Cognitive Status: Within Functional Limits for tasks assessed                      Exercises Total Joint Exercises Ankle Circles/Pumps: AROM;Both;10 reps Quad Sets: AROM;Both;10 reps Long Arc Quad: Right;10 reps;Seated;AROM Knee Flexion: AROM;Right;5 reps;Seated Goniometric ROM: 3-95    General Comments        Pertinent Vitals/Pain Pain Assessment: 0-10 Pain Score: 3  Pain Location: R knee during stand>sit Pain Descriptors / Indicators: Sharp Pain Intervention(s): Limited activity within patient's tolerance;Monitored during session;Repositioned    Home Living                      Prior Function            PT Goals (current goals can now be found in the care plan section) Acute Rehab PT Goals Patient Stated Goal: to go  to rehab Progress towards PT goals: Progressing toward goals    Frequency  7X/week    PT Plan Current plan remains appropriate    Co-evaluation             End of Session Equipment Utilized During Treatment: Gait belt Activity Tolerance: Patient limited by fatigue Patient left: in chair;with call bell/phone within reach;with family/visitor present     Time: 9767-3419 PT Time  Calculation (min) (ACUTE ONLY): 14 min  Charges:  $Gait Training: 8-22 mins                    G Codes:      Joslyn Hy PT, [place] [phone] Pager: [phone] 12/20/2014, 11:51 AM

## 2014-12-20 NOTE — Discharge Planning (Signed)
Patient will discharge today per MD order. Patient will discharge to [facility] SN[facility] RN to call report prior to transportation to [phone] Transportation: wife  Admissions paperwork complete and family ready to transport.  RN aware.  CSW sent discharge summary to SNF for review.  Packet is complete.  RN, patient and family aware of discharge plans.  Nonnie Done, [place] [phone]  Psychiatric & Orthopedics (5N 1-16) Clinical Social Worker

## 2014-12-20 NOTE — Clinical Social Work Placement (Signed)
   CLINICAL SOCIAL WORK PLACEMENT  NOTE  Date:  12/20/2014  Patient Details  Name: CHAPIN ARDUINI MRN: [id number] Date of Birth: 04-Sep-1940  Clinical Social Work is seeking post-discharge placement for this patient at the [place] level of care (*CSW will initial, date and re-position this form in  chart as items are completed):  Yes   Patient/family provided with [facility] Work Department's list of facilities offering this level of care within the geographic area requested by the patient (or if unable, by the patient's family).  Yes   Patient/family informed of their freedom to choose among providers that offer the needed level of care, that participate in Medicare, Medicaid or managed care program needed by the patient, have an available bed and are willing to accept the patient.  Yes   Patient/family informed of [place] [facility]'s ownership interest in [facility] and [facility], as well as of the fact that they are under no obligation to receive care at these facilities.  PASRR submitted to EDS on 12/18/14     PASRR number received on 12/18/14     Existing PASRR number confirmed on       FL2 transmitted to all facilities in geographic area requested by pt/family on 12/18/14     FL2 transmitted to all facilities within larger geographic area on       Patient informed that his/her managed care company has contracts with or will negotiate with certain facilities, including the following:        Yes   Patient/family informed of bed offers received.  Patient chooses bed at [facility]     Physician recommends and patient chooses bed at      Patient to be transferred to [facility] on 12/20/14.  Patient to be transferred to facility by wife     Patient family notified on 12/20/14 of transfer.  Name of family member notified:  wife     PHYSICIAN       Additional Comment:    Nonnie Done, [place] [phone]  Psychiatric &  Orthopedics (5N 1-8) Clinical Social Worker

## 2014-12-20 NOTE — Progress Notes (Signed)
Pt being discharged to [facility] via wife. Pt alert and oriented x4. No c/o pain at this time and no respiratory distress. Pt IV removed and catheter intact. Pt tolerated well. Care plans resolved and education complete. Incision site CDI. No issues at this time. Leanne Chang, RN

## 2014-12-20 NOTE — Discharge Summary (Signed)
Patient ID: Eric Mayer MRN: [id number] DOB/AGE: 1941/01/01 74 y.o.  Admit date: 12/17/2014 Discharge date: 12/20/2014  Admission Diagnoses:  Principal Problem:   Primary osteoarthritis of right knee   Discharge Diagnoses:  Same  Past Medical History  Diagnosis Date  . Hypertension   . History of radiation therapy 1993    sarcoma of left groin, tx at [facility]  . PAF (paroxysmal atrial fibrillation)     prior CARDIOLOGIST-- DR Tressia Miners TURNER/   now monitored by dr Jenny Reichmann griffin  . Incomplete right bundle branch block   . History of sarcoma     1993  LEFT GOIN--  S/P SURGERY, RADIATION AND CHEMO IN [place]  . Hypogonadism in male   . History of peptic ulcer     1980's  . History of gastric bypass   . 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 Weakness of left leg     SECONDARY TO SURGICAL NERVE INJURY OF LEFT GOIN  . Nocturia   . Arthritis   . Wears glasses   . OSA on CPAP   . Depression   . Primary prostate adenocarcinoma DX 07/08/14    Gleason 7,  stage T1c    Surgeries: Procedure(s): TOTAL KNEE ARTHROPLASTY on 12/17/2014   Consultants:    Discharged Condition: Improved  Hospital Course: BLONG BUSK is an 74 y.o. male who was admitted 12/17/2014 for operative treatment ofPrimary osteoarthritis of right knee. Patient has severe unremitting pain that affects sleep, daily activities, and work/hobbies. After pre-op clearance the patient was taken to the operating room on 12/17/2014 and underwent  Procedure(s): TOTAL KNEE ARTHROPLASTY.    Patient was given perioperative antibiotics: Anti-infectives    Start     Dose/Rate Route Frequency Ordered Stop   12/17/14 2030  ceFAZolin (ANCEF) 3 g in dextrose 5 % 50 mL IVPB     3 g 160 mL/hr over 30 Minutes Intravenous Every 6 hours 12/17/14 2014 12/18/14 0453   12/17/14 0600  ceFAZolin (ANCEF) 3 g in dextrose 5 % 50 mL IVPB     3 g 160  mL/hr over 30 Minutes Intravenous On call to O.R. 12/16/14 1456 12/17/14 1343       Patient was given sequential compression devices, early ambulation, and chemoprophylaxis to prevent DVT.  Patient benefited maximally from hospital stay and there were no complications.    Recent vital signs: Patient Vitals for the past 24 hrs:  BP Temp Pulse Resp SpO2  12/20/14 0521 (!) 124/50 mmHg 98.4 F (36.9 C) 78 18 91 %  12/20/14 0158 - 98.4 F (36.9 C) - - -  12/19/14 2046 (!) 120/42 mmHg (!) 100.8 F (38.2 C) 87 18 91 %  12/19/14 1509 (!) 118/50 mmHg - - - -  12/19/14 1400 (!) 108/34 mmHg 98.3 F (36.8 C) 74 18 93 %     Recent laboratory studies:  Recent Labs  12/18/14 0615 12/19/14 0436 12/20/14 0505  WBC 8.0 7.6 8.7  HGB 11.3* 10.0* 9.4*  HCT 34.7* 30.8* 28.4*  PLT 178 141* 140*  NA 140  --   --   K 4.0  --   --   CL 108  --   --   CO2 22  --   --   BUN 21*  --   --   CREATININE 0.89  --   --   GLUCOSE 126*  --   --  CALCIUM 8.4*  --   --      Discharge Medications:     Medication List    STOP taking these medications        traMADol-acetaminophen 37.5-325 MG per tablet  Commonly known as:  ULTRACET      TAKE these medications        acetaminophen 650 MG CR tablet  Commonly known as:  TYLENOL  Take 1,300 mg by mouth daily as needed for pain.     amphetamine-dextroamphetamine 20 MG 24 hr capsule  Commonly known as:  ADDERALL XR  Take 20 mg by mouth daily as needed (ADD).     buPROPion 300 MG 24 hr tablet  Commonly known as:  WELLBUTRIN XL  Take 300 mg by mouth every morning.     cholecalciferol 1000 UNITS tablet  Commonly known as:  VITAMIN D  Take 1,000 Units by mouth daily.     CO Q 10 PO  Take 300 mg by mouth every evening.     dabigatran 150 MG Caps capsule  Commonly known as:  PRADAXA  Take 1 capsule (150 mg total) by mouth every 12 (twelve) hours.     diltiazem 240 MG 24 hr capsule  Commonly known as:  TIAZAC  Take 240 mg by mouth every  morning.     escitalopram 10 MG tablet  Commonly known as:  LEXAPRO  Take 10 mg by mouth daily.     HYDROcodone-acetaminophen 5-325 MG per tablet  Commonly known as:  NORCO/VICODIN  Take 1-2 tablets by mouth every 4 (four) hours as needed (breakthrough pain).     methocarbamol 500 MG tablet  Commonly known as:  ROBAXIN  Take 1 tablet (500 mg total) by mouth every 6 (six) hours as needed for muscle spasms.     metoprolol 50 MG tablet  Commonly known as:  LOPRESSOR  Take 25 mg by mouth 2 (two) times daily.     multivitamin with minerals Tabs tablet  Take 1 tablet by mouth daily.     phenazopyridine 200 MG tablet  Commonly known as:  PYRIDIUM  Take 1 tablet (200 mg total) by mouth 3 (three) times daily as needed for pain.     tamsulosin 0.4 MG Caps capsule  Commonly known as:  FLOMAX  Take 1 capsule (0.4 mg total) by mouth daily.     trimethoprim 100 MG tablet  Commonly known as:  TRIMPEX  Take 1 tablet (100 mg total) by mouth 1 day or 1 dose.     valsartan-hydrochlorothiazide 160-25 MG per tablet  Commonly known as:  DIOVAN-HCT  Take 1 tablet by mouth every morning.     VITAMIN B-12 IJ  Inject 1 mL as directed every 30 (thirty) days.        Diagnostic Studies: No results found.  Disposition: 01-Home or Self Care      Discharge Instructions    Call MD / Call 911    Complete by:  As directed   If you experience chest pain or shortness of breath, CALL 911 and be transported to the hospital emergency room.  If you develope a fever above 101 F, pus (white drainage) or increased drainage or redness at the wound, or calf pain, call your surgeon's office.     Constipation Prevention    Complete by:  As directed   Drink plenty of fluids.  Prune juice may be helpful.  You may use a stool softener, such as Colace (over the counter) 100  mg twice a day.  Use MiraLax (over the counter) for constipation as needed.     Diet - low sodium heart healthy    Complete by:  As directed       Discharge instructions    Complete by:  As directed   INSTRUCTIONS AFTER JOINT REPLACEMENT   Remove items at home which could result in a fall. This includes throw rugs or furniture in walking pathways ICE to the affected joint every three hours while awake for 30 minutes at a time, for at least the first 3-5 days, and then as needed for pain and swelling.  Continue to use ice for pain and swelling. You may notice swelling that will progress down to the foot and ankle.  This is normal after surgery.  Elevate your leg when you are not up walking on it.   Continue to use the breathing machine you got in the hospital (incentive spirometer) which will help keep your temperature down.  It is common for your temperature to cycle up and down following surgery, especially at night when you are not up moving around and exerting yourself.  The breathing machine keeps your lungs expanded and your temperature down.   DIET:  As you were doing prior to hospitalization, we recommend a well-balanced diet.  DRESSING / WOUND CARE / SHOWERING  Keep bandage clean and dry until follow up.  ACTIVITY  Increase activity slowly as tolerated, but follow the weight bearing instructions below.   No driving for 6 weeks or until further direction given by your physician.  You cannot drive while taking narcotics.  No lifting or carrying greater than 10 lbs. until further directed by your surgeon. Avoid periods of inactivity such as sitting longer than an hour when not asleep. This helps prevent blood clots.  You may return to work once you are authorized by your doctor.     WEIGHT BEARING   Weight bearing as tolerated with assist device (walker, cane, etc) as directed, use it as long as suggested by your surgeon or therapist, typically at least 4-6 weeks.   EXERCISES  Results after joint replacement surgery are often greatly improved when you follow the exercise, range of motion and muscle strengthening  exercises prescribed by your doctor. Safety measures are also important to protect the joint from further injury. Any time any of these exercises cause you to have increased pain or swelling, decrease what you are doing until you are comfortable again and then slowly increase them. If you have problems or questions, call your caregiver or physical therapist for advice.   Rehabilitation is important following a joint replacement. After just a few days of immobilization, the muscles of the leg can become weakened and shrink (atrophy).  These exercises are designed to build up the tone and strength of the thigh and leg muscles and to improve motion. Often times heat used for twenty to thirty minutes before working out will loosen up your tissues and help with improving the range of motion but do not use heat for the first two weeks following surgery (sometimes heat can increase post-operative swelling).   These exercises can be done on a training (exercise) mat, on the floor, on a table or on a bed. Use whatever works the best and is most comfortable for you.    Use music or television while you are exercising so that the exercises are a pleasant break in your day. This will make your life better with the exercises  acting as a break in your routine that you can look forward to.   Perform all exercises about fifteen times, three times per day or as directed.  You should exercise both the operative leg and the other leg as well.   Exercises include:   Quad Sets - Tighten up the muscle on the front of the thigh (Quad) and hold for 5-10 seconds.   Straight Leg Raises - With your knee straight (if you were given a brace, keep it on), lift the leg to 60 degrees, hold for 3 seconds, and slowly lower the leg.  Perform this exercise against resistance later as your leg gets stronger.  Leg Slides: Lying on your back, slowly slide your foot toward your buttocks, bending your knee up off the floor (only go as far as is  comfortable). Then slowly slide your foot back down until your leg is flat on the floor again.  Angel Wings: Lying on your back spread your legs to the side as far apart as you can without causing discomfort.  Hamstring Strength:  Lying on your back, push your heel against the floor with your leg straight by tightening up the muscles of your buttocks.  Repeat, but this time bend your knee to a comfortable angle, and push your heel against the floor.  You may put a pillow under the heel to make it more comfortable if necessary.   A rehabilitation program following joint replacement surgery can speed recovery and prevent re-injury in the future due to weakened muscles. Contact your doctor or a physical therapist for more information on knee rehabilitation.    CONSTIPATION  Constipation is defined medically as fewer than three stools per week and severe constipation as less than one stool per week.  Even if you have a regular bowel pattern at home, your normal regimen is likely to be disrupted due to multiple reasons following surgery.  Combination of anesthesia, postoperative narcotics, change in appetite and fluid intake all can affect your bowels.   YOU MUST use at least one of the following options; they are listed in order of increasing strength to get the job done.  They are all available over the counter, and you may need to use some, POSSIBLY even all of these options:    Drink plenty of fluids (prune juice may be helpful) and high fiber foods Colace 100 mg by mouth twice a day  Senokot for constipation as directed and as needed Dulcolax (bisacodyl), take with full glass of water  Miralax (polyethylene glycol) once or twice a day as needed.  If you have tried all these things and are unable to have a bowel movement in the first 3-4 days after surgery call either your surgeon or your primary doctor.    If you experience loose stools or diarrhea, hold the medications until you stool forms back  up.  If your symptoms do not get better within 1 week or if they get worse, check with your doctor.  If you experience "the worst abdominal pain ever" or develop nausea or vomiting, please contact the office immediately for further recommendations for treatment.   ITCHING:  If you experience itching with your medications, try taking only a single pain pill, or even half a pain pill at a time.  You can also use Benadryl over the counter for itching or also to help with sleep.   TED HOSE STOCKINGS:  Use stockings on both legs until for at least 2 weeks or as  directed by physician office. They may be removed at night for sleeping.  MEDICATIONS:  See your medication summary on the "After Visit Summary" that nursing will review with you.  You may have some home medications which will be placed on hold until you complete the course of blood thinner medication.  It is important for you to complete the blood thinner medication as prescribed.  PRECAUTIONS:  If you experience chest pain or shortness of breath - call 911 immediately for transfer to the hospital emergency department.   If you develop a fever greater that 101 F, purulent drainage from wound, increased redness or drainage from wound, foul odor from the wound/dressing, or calf pain - CONTACT YOUR SURGEON.                                                   FOLLOW-UP APPOINTMENTS:  If you do not already have a post-op appointment, please call the office for an appointment to be seen by your surgeon.  Guidelines for how soon to be seen are listed in your "After Visit Summary", but are typically between 1-4 weeks after surgery.  OTHER INSTRUCTIONS:   Knee Replacement:  Do not place pillow under knee, focus on keeping the knee straight while resting. CPM instructions: 0-90 degrees, 2 hours in the morning, 2 hours in the afternoon, and 2 hours in the evening. Place foam block, curve side up under heel at all times except when in CPM or when walking.  DO  NOT modify, tear, cut, or change the foam block in any way.  MAKE SURE YOU:  Understand these instructions.  Get help right away if you are not doing well or get worse.    Thank you for letting us be a part of your medical care team.  It is a privilege we respect greatly.  We hope these instructions will help you stay on track for a fast and full recovery!     Increase activity slowly as tolerated    Complete by:  As directed            Follow-up Information    Follow up with Hessie Dibble, MD. Schedule an appointment as soon as possible for a visit in 2 weeks.   Specialty:  Orthopedic Surgery   Contact information:   [place] [id number] [phone]        Signed: Rich Fuchs 12/20/2014, 7:48 AM

## 2014-12-23 ENCOUNTER — Encounter: Payer: Self-pay | Admitting: Adult Health

## 2014-12-23 ENCOUNTER — Non-Acute Institutional Stay (SKILLED_NURSING_FACILITY): Payer: Medicare Other | Admitting: Adult Health

## 2014-12-23 DIAGNOSIS — F988 Other specified behavioral and emotional disorders with onset usually occurring in childhood and adolescence: Secondary | ICD-10-CM

## 2014-12-23 DIAGNOSIS — G4733 Obstructive sleep apnea (adult) (pediatric): Secondary | ICD-10-CM

## 2014-12-23 DIAGNOSIS — M1711 Unilateral primary osteoarthritis, right knee: Secondary | ICD-10-CM | POA: Diagnosis not present

## 2014-12-23 DIAGNOSIS — R339 Retention of urine, unspecified: Secondary | ICD-10-CM

## 2014-12-23 DIAGNOSIS — I480 Paroxysmal atrial fibrillation: Secondary | ICD-10-CM

## 2014-12-23 DIAGNOSIS — I10 Essential (primary) hypertension: Secondary | ICD-10-CM

## 2014-12-23 DIAGNOSIS — F32A Depression, unspecified: Secondary | ICD-10-CM

## 2014-12-23 DIAGNOSIS — F909 Attention-deficit hyperactivity disorder, unspecified type: Secondary | ICD-10-CM | POA: Diagnosis not present

## 2014-12-23 DIAGNOSIS — F329 Major depressive disorder, single episode, unspecified: Secondary | ICD-10-CM

## 2014-12-23 NOTE — Progress Notes (Signed)
Patient ID: Eric Mayer, male   DOB: [birth year], 74 y.o.   MRN: [id number]   12/23/2014  Facility:  Nursing Home Location:  [place] Room Number: 703-P LEVEL OF CARE:  SNF (31)   Chief Complaint  Patient presents with  . Hospitalization Follow-up    Osteoarthritis S/P right total knee arthroplasty, ADD, depression, PAF, urinary retention, hypertension and OSA    HISTORY OF PRESENT ILLNESS:  This is a 74 year old male who has been admitted to [facility] on 12/20/14 from [facility] with osteoarthritis S/P right total knee arthroplasty. 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  Patient is alert and oriented and reports that this pain is well controlled. He is able to move left leg but not able to lift it. He has to lift it with his arms/hands. He is able to transfer from wheelchair to bed with a walker independently. Wife was at bedside.  PAST MEDICAL HISTORY:  Past Medical History  Diagnosis Date  . Hypertension   . History of radiation therapy 1993    sarcoma of left groin, tx at [facility]  . PAF (paroxysmal atrial fibrillation)     prior CARDIOLOGIST-- DR Tressia Miners TURNER/   now monitored by dr Jenny Reichmann griffin  . Incomplete right bundle branch block   . History of sarcoma     1993  LEFT GOIN--  S/P SURGERY, RADIATION AND CHEMO IN [place]  . Hypogonadism in male   . History of peptic ulcer     1980's  . History of gastric bypass   . 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 Weakness of left leg     SECONDARY TO SURGICAL NERVE INJURY OF LEFT GOIN  . Nocturia   . Arthritis   . Wears glasses   . OSA on CPAP   . Depression   . Primary prostate adenocarcinoma DX 07/08/14    Gleason  7,  stage T1c    CURRENT MEDICATIONS: Reviewed per MAR/see medication list  Allergies  Allergen Reactions  . Ace Inhibitors Other (See Comments)    cough     REVIEW OF SYSTEMS:  GENERAL: no change in appetite, no fatigue, no weight changes, no fever, chills or weakness RESPIRATORY: no cough, SOB, DOE, wheezing, hemoptysis CARDIAC: no chest pain, or palpitations GI: no abdominal pain, diarrhea, constipation, heart burn, nausea or vomiting  PHYSICAL EXAMINATION  GENERAL: no acute distress, normal body habitus SKIN:  Right knee surgical incision is dry, no redness; aquacel dressing intact EYES: conjunctivae normal, sclerae normal, normal eye lids NECK: supple, trachea midline, no neck masses, no thyroid tenderness, no thyromegaly LYMPHATICS: no LAN in the neck, no supraclavicular LAN RESPIRATORY: breathing is even & unlabored, BS CTAB CARDIAC: RRR, no murmur,no extra heart sounds, BLE edema 1+ GI: abdomen soft, normal BS, no masses, no tenderness, no hepatomegaly, no splenomegaly EXTREMITIES: able to move X 4 extremities but not able to lift LLE without being lifted by his hands/arms PSYCHIATRIC: the patient is alert & oriented to person, affect & behavior appropriate  LABS/RADIOLOGY: Labs reviewed: Basic Metabolic Panel:  Recent Labs  10/14/14 1248 12/05/14 1047 12/18/14 0615  NA 144 139 140  K 3.9 4.0 4.0  CL  113* 107 108  CO2 26 27 22   GLUCOSE 106* 105* 126*  BUN 18 14 21*  CREATININE 0.75 0.79 0.89  CALCIUM 8.9 9.1 8.4*   Liver Function Tests:  Recent Labs  10/14/14 1248  AST 19  ALT 31  ALKPHOS 92  BILITOT 1.0  PROT 6.2  ALBUMIN 3.8   CBC:  Recent Labs  12/05/14 1047 12/18/14 0615 12/19/14 0436 12/20/14 0505  WBC 6.9 8.0 7.6 8.7  NEUTROABS 4.9  --   --   --   HGB 12.4* 11.3* 10.0* 9.4*  HCT 38.4* 34.7* 30.8* 28.4*  MCV 95.3 94.0 94.2 94.0  PLT 189 178 141* 140*     ASSESSMENT/PLAN:  Osteoarthritis S/P right total knee arthroplasty -  for rehabilitation; decrease Tylenol 325 mg take 2 tabs = 650 mg PO BID PRN when necessary and Norco 5/325 mg 1-2 tabs by mouth every 4 hours when necessary for pain, not to exceed 4 gm of Tylenol/24 hour; and Robaxin 500 mg 1 tab by mouth every 6 hours when necessary for muscle spasm;  follow-up with Dr. Latanya Maudlin, orthosurgeon, in 2 weeks ADD - continue Adderall XR 20 mg 1 by mouth daily PRN Depression - mood is stable; continue Wellbutrin XL 300 mg by mouth every morning and Lexapro 10 mg by mouth daily; he reports being on this combination for a long time and was prescribed by Dr. Laurann Montana, his PCP PAF - rate controlled; continue Pradaxa 150 mg by mouth every 12 hours, diltiazem 240 mg 24 hour 1 capsule by mouth every morning and metoprolol 25 mg by mouth twice a day Urinary retention - continue Flomax 0.4 mg 1 by mouth daily Hypertension - well controlled; continue losartan HCTZ 160-25 mg 1 tab by mouth every morning OSA - CPAP at night   Goals of care:  Short-term rehabilitation   Labs/test ordered:  CBC, BMP   Spent 50 minutes in patient care.   [facility], NP [employer] [phone]

## 2014-12-24 ENCOUNTER — Non-Acute Institutional Stay (SKILLED_NURSING_FACILITY): Payer: Medicare Other | Admitting: Internal Medicine

## 2014-12-24 DIAGNOSIS — I482 Chronic atrial fibrillation, unspecified: Secondary | ICD-10-CM

## 2014-12-24 DIAGNOSIS — R339 Retention of urine, unspecified: Secondary | ICD-10-CM | POA: Diagnosis not present

## 2014-12-24 DIAGNOSIS — F329 Major depressive disorder, single episode, unspecified: Secondary | ICD-10-CM

## 2014-12-24 DIAGNOSIS — M1711 Unilateral primary osteoarthritis, right knee: Secondary | ICD-10-CM | POA: Diagnosis not present

## 2014-12-24 DIAGNOSIS — I10 Essential (primary) hypertension: Secondary | ICD-10-CM | POA: Diagnosis not present

## 2014-12-24 DIAGNOSIS — F909 Attention-deficit hyperactivity disorder, unspecified type: Secondary | ICD-10-CM | POA: Diagnosis not present

## 2014-12-24 DIAGNOSIS — Z9989 Dependence on other enabling machines and devices: Secondary | ICD-10-CM

## 2014-12-24 DIAGNOSIS — G4733 Obstructive sleep apnea (adult) (pediatric): Secondary | ICD-10-CM | POA: Diagnosis not present

## 2014-12-24 DIAGNOSIS — D62 Acute posthemorrhagic anemia: Secondary | ICD-10-CM

## 2014-12-24 DIAGNOSIS — F32A Depression, unspecified: Secondary | ICD-10-CM

## 2014-12-24 DIAGNOSIS — F988 Other specified behavioral and emotional disorders with onset usually occurring in childhood and adolescence: Secondary | ICD-10-CM

## 2014-12-24 NOTE — Progress Notes (Signed)
Patient ID: Eric Mayer, male   DOB: Jun 04, 1941, 74 y.o.   MRN: [id number]     [facility]   PCP: No primary care provider on file.  Code Status: full code  Allergies  Allergen Reactions  . Ace Inhibitors Other (See Comments)    cough    Chief Complaint  Patient presents with  . New Admit To SNF     HPI:  74 year old patient is here for short term rehabilitation post hospital admission from 12/17/14-12/20/14 with right knee OA. He underwent right TKA. He is seen in his room today with his wife present. He just completed therapy and is having pain and spasm in his right leg. He denies any other concerns.  He has PMH of HTN, prostate cancer, afib, ADD, OSA, depression.  Review of Systems:  Constitutional: Negative for fever, chills, diaphoresis.  HENT: Negative for headache, congestion, nasal discharge Eyes: Negative for eye pain, blurred vision, double vision and discharge.  Respiratory: Negative for cough, shortness of breath and wheezing.   Cardiovascular: Negative for chest pain, palpitations, leg swelling.  Gastrointestinal: Negative for heartburn, nausea, vomiting, abdominal pain. Had a bowel movement today Genitourinary: Negative for dysuria. Has incomplete emptying of bladder with hesitancy and dribbling post prostate cancer Musculoskeletal: Negative for back pain, falls Skin: Negative for itching, rash.  Neurological: Negative for dizziness, tingling, focal weakness Psychiatric/Behavioral: Negative for depression.    Past Medical History  Diagnosis Date  . Hypertension   . History of radiation therapy 1993    sarcoma of left groin, tx at [facility]  . PAF (paroxysmal atrial fibrillation)     prior CARDIOLOGIST-- DR Tressia Miners TURNER/   now monitored by dr Jenny Reichmann griffin  . Incomplete right bundle branch block   . History of sarcoma     1993  LEFT GOIN--  S/P SURGERY, RADIATION AND CHEMO IN [place]  . Hypogonadism in male   . History  of peptic ulcer     1980's  . History of gastric bypass   . 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 Weakness of left leg     SECONDARY TO SURGICAL NERVE INJURY OF LEFT GOIN  . Nocturia   . Arthritis   . Wears glasses   . OSA on CPAP   . Depression   . Primary prostate adenocarcinoma DX 07/08/14    Gleason 7,  stage T1c   Past Surgical History  Procedure Laterality Date  . Intestinal bypass  1976    GASTRIC FOR OBESITY  . Patella fracture surgery Left x2  1995  . Prostate biopsy  06/2014  . Ventral hernia repair  1977  . Left goin sarcoma surgery  1992   [place]  . Colonoscopy  07-03-2002  . Transthoracic echocardiogram  08-08-2003    moderate LVH/  ef 55-65%/  mild MR/  moderate LAE/  trivial TR/  trivial pericardial effusion posterior to the heart  . Orif left arm fx  1986  . Tonsillectomy  as child  . Radioactive seed implant N/A 10/17/2014    Procedure: RADIOACTIVE SEED IMPLANT    ;  Surgeon: Ailene Rud, MD;  [facility]: [facility];  Service: Urology;  Laterality: N/A;   35 SEEDS IMPLANTED NO SEEDS FOUND IN BLADDER  . Cardiac catheterization  08-12-2003  dr Tressia Miners turner    Normal  coronary arteries, normal wall motion, no sig. abnormalities  . Hernia repair      as child  . Total knee arthroplasty Right 12/17/2014    Procedure: TOTAL KNEE ARTHROPLASTY;  Surgeon: Melrose Nakayama, MD;  Location: [facility];  Service: Orthopedics;  Laterality: Right;   Social History:   reports that he has never smoked. He has never used smokeless tobacco. He reports that he does not drink alcohol or use illicit drugs.  Family History  Problem Relation Age of Onset  . Liver disease Mother   . Renal Disease Mother   . Stroke Mother   . Atrial fibrillation Father   . Cancer Sister     Medications: Patient's Medications  New Prescriptions   No medications on file  Previous  Medications   ACETAMINOPHEN (TYLENOL) 650 MG CR TABLET    Take 1,300 mg by mouth daily as needed for pain.   AMPHETAMINE-DEXTROAMPHETAMINE (ADDERALL XR) 20 MG 24 HR CAPSULE    Take 20 mg by mouth daily as needed (ADD).   BUPROPION (WELLBUTRIN XL) 300 MG 24 HR TABLET    Take 300 mg by mouth every morning.   CHOLECALCIFEROL (VITAMIN D) 1000 UNITS TABLET    Take 1,000 Units by mouth daily.   COENZYME Q10 (CO Q 10 PO)    Take 300 mg by mouth every evening.    CYANOCOBALAMIN (VITAMIN B-12 IJ)    Inject 1 mL as directed every 30 (thirty) days.    DABIGATRAN (PRADAXA) 150 MG CAPS CAPSULE    Take 1 capsule (150 mg total) by mouth every 12 (twelve) hours.   DILTIAZEM (TIAZAC) 240 MG 24 HR CAPSULE    Take 240 mg by mouth every morning.    ESCITALOPRAM (LEXAPRO) 10 MG TABLET    Take 10 mg by mouth daily.   HYDROCODONE-ACETAMINOPHEN (NORCO/VICODIN) 5-325 MG PER TABLET    Take 1-2 tablets by mouth every 4 (four) hours as needed (breakthrough pain).   METHOCARBAMOL (ROBAXIN) 500 MG TABLET    Take 1 tablet (500 mg total) by mouth every 6 (six) hours as needed for muscle spasms.   METOPROLOL (LOPRESSOR) 50 MG TABLET    Take 25 mg by mouth 2 (two) times daily.   MULTIPLE VITAMIN (MULTIVITAMIN WITH MINERALS) TABS TABLET    Take 1 tablet by mouth daily.   PHENAZOPYRIDINE (PYRIDIUM) 200 MG TABLET    Take 1 tablet (200 mg total) by mouth 3 (three) times daily as needed for pain.   TAMSULOSIN (FLOMAX) 0.4 MG CAPS CAPSULE    Take 1 capsule (0.4 mg total) by mouth daily.   TRIMETHOPRIM (TRIMPEX) 100 MG TABLET    Take 1 tablet (100 mg total) by mouth 1 day or 1 dose.   VALSARTAN-HYDROCHLOROTHIAZIDE (DIOVAN-HCT) 160-25 MG PER TABLET    Take 1 tablet by mouth every morning.   Modified Medications   No medications on file  Discontinued Medications   No medications on file     Physical Exam: Filed Vitals:   12/24/14 1747  BP: 115/60  Pulse: 76  Temp: 97.4 F (36.3 C)  Resp: 16  SpO2: 95%    General- elderly  male, well built, in no acute distress Head- normocephalic, atraumatic Throat- moist mucus membrane Eyes- PERRLA, EOMI, no pallor, no icterus, no discharge, normal conjunctiva, normal sclera Neck- no cervical lymphadenopathy Cardiovascular- normal s1,s2, no murmurs, palpable dorsalis pedis and radial pulses, right leg edema Respiratory- bilateral clear to auscultation, no wheeze, no rhonchi, no crackles, no use of  accessory muscles Abdomen- bowel sounds present, soft, non tender Musculoskeletal- able to move all 4 extremities  Neurological- no focal deficit Skin- warm and dry, right knee aquacel dressing with scant drainage having stained the dressing, this area had been marked prior to discharge from hospital and there has been no increase Psychiatry- alert and oriented to person, place and time, normal mood and affect    Labs reviewed: Basic Metabolic Panel:  Recent Labs  10/14/14 1248 12/05/14 1047 12/18/14 0615  NA 144 139 140  K 3.9 4.0 4.0  CL 113* 107 108  CO2 26 27 22   GLUCOSE 106* 105* 126*  BUN 18 14 21*  CREATININE 0.75 0.79 0.89  CALCIUM 8.9 9.1 8.4*   Liver Function Tests:  Recent Labs  10/14/14 1248  AST 19  ALT 31  ALKPHOS 92  BILITOT 1.0  PROT 6.2  ALBUMIN 3.8   No results for input(s): LIPASE, AMYLASE in the last 8760 hours. No results for input(s): AMMONIA in the last 8760 hours. CBC:  Recent Labs  12/05/14 1047 12/18/14 0615 12/19/14 0436 12/20/14 0505  WBC 6.9 8.0 7.6 8.7  NEUTROABS 4.9  --   --   --   HGB 12.4* 11.3* 10.0* 9.4*  HCT 38.4* 34.7* 30.8* 28.4*  MCV 95.3 94.0 94.2 94.0  PLT 189 178 141* 140*    Assessment/Plan  Right knee Osteoarthritis  S/P right total knee arthroplasty. Will have him work with physical therapy and occupational therapy team to help with gait training and muscle strengthening exercises.fall precautions. Skin care. Encourage to be out of bed. Continue norco 5-325 1-2 tab q4h prn with robaxin 500 mg q6h  prn for pain and muscle spasm respectively. Has f/u with orthopedics. Continue pradaxa for dvt prophylaxis and vitamin d supplement. To provide pain medication and robaxin x 1 now for his current symptoms. Continue cpm machine. Does not want to wear ted hose. Not on any stool softner/ laxative at present but bowel movement regular. Does not want any prophylactically. Monitor clinically for now. Hydration encouraged.  Blood loss anemia Post op, monitor h&h  Urinary retention Stable with flomax daily and trimethoprim 100 mg daily for uti prophylaxis. Hydration encouraged  HTN Stable. Continue losartan HCTZ 160-25 mg daily and metoprolol 25 mg bid and monitor bp  afib Rate controlled. Continue diltiazem 240 mg daily and metoprolol 25 mg bid for rate control. continue pradaxa for anticoagulation  OSA Continue CPAP at bedtime  ADD continue Adderall XR 20 mg daily as needed  Depression  continue Wellbutrin XL 300 mg daily and Lexapro 10 mg daily   Goals of care: short term rehabilitation   Labs/tests ordered: cbc  Family/ staff Communication: reviewed care plan with patient and nursing supervisor    Blanchie Serve, MD  [place] [phone] (Monday-Friday 8 am - 5 pm) [phone] (afterhours)

## 2014-12-31 ENCOUNTER — Non-Acute Institutional Stay (SKILLED_NURSING_FACILITY): Payer: Medicare Other | Admitting: Adult Health

## 2014-12-31 ENCOUNTER — Encounter: Payer: Self-pay | Admitting: Adult Health

## 2014-12-31 DIAGNOSIS — G4733 Obstructive sleep apnea (adult) (pediatric): Secondary | ICD-10-CM

## 2014-12-31 DIAGNOSIS — M1711 Unilateral primary osteoarthritis, right knee: Secondary | ICD-10-CM

## 2014-12-31 DIAGNOSIS — F329 Major depressive disorder, single episode, unspecified: Secondary | ICD-10-CM

## 2014-12-31 DIAGNOSIS — F909 Attention-deficit hyperactivity disorder, unspecified type: Secondary | ICD-10-CM

## 2014-12-31 DIAGNOSIS — F988 Other specified behavioral and emotional disorders with onset usually occurring in childhood and adolescence: Secondary | ICD-10-CM

## 2014-12-31 DIAGNOSIS — R339 Retention of urine, unspecified: Secondary | ICD-10-CM | POA: Diagnosis not present

## 2014-12-31 DIAGNOSIS — I10 Essential (primary) hypertension: Secondary | ICD-10-CM

## 2014-12-31 DIAGNOSIS — F32A Depression, unspecified: Secondary | ICD-10-CM

## 2014-12-31 DIAGNOSIS — I480 Paroxysmal atrial fibrillation: Secondary | ICD-10-CM

## 2014-12-31 DIAGNOSIS — D62 Acute posthemorrhagic anemia: Secondary | ICD-10-CM | POA: Diagnosis not present

## 2014-12-31 DIAGNOSIS — Z9989 Dependence on other enabling machines and devices: Secondary | ICD-10-CM

## 2015-03-16 NOTE — Progress Notes (Signed)
Cardiology Office Note   Date:  03/17/2015   ID:  MOUSTAPHA TOOKER, DOB Mar 07, 1941, MRN [id number]  PCP:  No primary care provider on file.  Cardiologist:   Sharol Harness, MD   Chief Complaint  Patient presents with  . Establish Care    no chest pain some fatigue, gets tired quickly. leg swelling, pain where he had surgery. no other concerns       History of Present Illness: Eric Mayer is a 74 y.o. male with HTN, atrial fibrillation, and prostate adenocarcinoma currently undergoing XRT who presents for evaluation of dyspnea on exertion.  [name] had a knee replacement surgery 5/3 and was able to talk to the street from his house and perform PT.  However, in the last 2-3 weeks he feels more tired with walking and has to stop more frequently.  These episodes are associated with diaphoresis but he denies CP, nausea, lightheadedness or dizziness.  The episodes last several minutes and improve with rest.  He feels completely drained and SOB.  [name] also denies orthopnea or PND.  His weight has increased 15lb over the last several months due to decreased physical activity.  [name]  has chronic L LE edema which is ongoing since his groin sarcoma several years ago.  The R leg has been swollen since his knee surgery.  He has not missed any doses of Pradaxa.  In the past he had similar symptoms in the setting of anemia, though this was assessed by his PCP and reportedly only mildly abnormal.  Dr. [name] has been managing [name]'s atrial fibrillation.  He has been taking Pradaxa and is rate controlled with diltiazem and metoprolol.  To his knowledge his HR has been well-controlled in the recent history  Past Medical History  Diagnosis Date  . Hypertension   . History of radiation therapy 1993    sarcoma of left groin, tx at [facility]  . PAF (paroxysmal atrial fibrillation)     prior CARDIOLOGIST-- DR Tressia Miners TURNER/   now monitored by dr Jenny Reichmann griffin  .  Incomplete right bundle branch block   . History of sarcoma     1993  LEFT GOIN--  S/P SURGERY, RADIATION AND CHEMO IN [place]  . Hypogonadism in male   . History of peptic ulcer     1980's  . History of gastric bypass   . 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 Weakness of left leg     SECONDARY TO SURGICAL NERVE INJURY OF LEFT GOIN  . Nocturia   . Arthritis   . Wears glasses   . OSA on CPAP   . Depression   . Primary prostate adenocarcinoma DX 07/08/14    Gleason 7,  stage T1c    Past Surgical History  Procedure Laterality Date  . Intestinal bypass  1976    GASTRIC FOR OBESITY  . Patella fracture surgery Left x2  1995  . Prostate biopsy  06/2014  . Ventral hernia repair  1977  . Left goin sarcoma surgery  1992   [place]  . Colonoscopy  07-03-2002  . Transthoracic echocardiogram  08-08-2003    moderate LVH/  ef 55-65%/  mild MR/  moderate LAE/  trivial TR/  trivial pericardial effusion posterior to the heart  . Orif left arm fx  1986  . Tonsillectomy  as  child  . Radioactive seed implant N/A 10/17/2014    Procedure: RADIOACTIVE SEED IMPLANT    ;  Surgeon: Ailene Rud, MD;  Location: [facility];  Service: Urology;  Laterality: N/A;   70 SEEDS IMPLANTED NO SEEDS FOUND IN BLADDER  . Cardiac catheterization  08-12-2003  dr Tressia Miners turner    Normal coronary arteries, normal wall motion, no sig. abnormalities  . Hernia repair      as child  . Total knee arthroplasty Right 12/17/2014    Procedure: TOTAL KNEE ARTHROPLASTY;  Surgeon: Melrose Nakayama, MD;  Location: [facility];  Service: Orthopedics;  Laterality: Right;     Current Outpatient Prescriptions  Medication Sig Dispense Refill  . acetaminophen (TYLENOL) 650 MG CR tablet Take 1,300 mg by mouth daily as needed for pain.    [name] amphetamine-dextroamphetamine (ADDERALL XR) 20 MG 24 hr capsule Take 20 mg by mouth daily  as needed (ADD).    [name] buPROPion (WELLBUTRIN XL) 300 MG 24 hr tablet Take 300 mg by mouth every morning.    . cholecalciferol (VITAMIN D) 1000 UNITS tablet Take 1,000 Units by mouth daily.    . Coenzyme Q10 (CO Q 10 PO) Take 300 mg by mouth every evening.     . Cyanocobalamin (VITAMIN B-12 IJ) Inject 1 mL as directed every 30 (thirty) days.     . dabigatran (PRADAXA) 150 MG CAPS capsule Take 1 capsule (150 mg total) by mouth every 12 (twelve) hours. 60 capsule 0  . diltiazem (TIAZAC) 240 MG 24 hr capsule Take 240 mg by mouth every morning.     . escitalopram (LEXAPRO) 10 MG tablet Take 10 mg by mouth daily.    [name] HYDROcodone-acetaminophen (NORCO/VICODIN) 5-325 MG per tablet Take 1-2 tablets by mouth every 4 (four) hours as needed (breakthrough pain). 50 tablet 0  . methocarbamol (ROBAXIN) 500 MG tablet Take 1 tablet (500 mg total) by mouth every 6 (six) hours as needed for muscle spasms. 50 tablet 0  . metoprolol (LOPRESSOR) 50 MG tablet Take 25 mg by mouth 2 (two) times daily.    . Multiple Vitamin (MULTIVITAMIN WITH MINERALS) TABS tablet Take 1 tablet by mouth daily.    . phenazopyridine (PYRIDIUM) 200 MG tablet Take 1 tablet (200 mg total) by mouth 3 (three) times daily as needed for pain. 30 tablet 2  . tamsulosin (FLOMAX) 0.4 MG CAPS capsule Take 1 capsule (0.4 mg total) by mouth daily. 30 capsule 1  . valsartan-hydrochlorothiazide (DIOVAN-HCT) 160-25 MG per tablet Take 1 tablet by mouth every morning.     . trimethoprim (TRIMPEX) 100 MG tablet Take 1 tablet (100 mg total) by mouth 1 day or 1 dose. (Patient not taking: Reported on 12/03/2014) 10 tablet 0   No current facility-administered medications for this visit.    Allergies:   Ace inhibitors    Social History:  The patient  reports that he has never smoked. He has never used smokeless tobacco. He reports that he does not drink alcohol or use illicit drugs.   Family History:  The patient's family history includes Atrial fibrillation in  his father; Cancer in his sister; Liver disease in his mother; Renal Disease in his mother; Stroke in his mother.    ROS:  Please see the history of present illness.   Otherwise, review of systems are positive for none.   All other systems are reviewed and negative.    PHYSICAL EXAM: VS:  BP 94/50 mmHg  Pulse 93  Ht  5\' 10"  (1.778 m)  Wt 128.731 kg (283 lb 12.8 oz)  BMI 40.72 kg/m2 , BMI Body mass index is 40.72 kg/(m^2). GENERAL:  Well appearing HEENT:  Pupils equal round and reactive, fundi not visualized, oral mucosa unremarkable NECK:  No jugular venous distention, waveform within normal limits, carotid upstroke brisk and symmetric, no bruits, no thyromegaly LYMPHATICS:  No cervical,  adenopathy LUNGS:  Clear to auscultation bilaterally CHEST:  Unremarkable HEART:  PMI not displaced or sustained,S1 and S2 within normal limits, no S3, no S4, no clicks, no rubs, no murmurs ABD:  Flat, positive bowel sounds normal in frequency in pitch, no bruits, no rebound, no guarding, no midline pulsatile mass, no hepatomegaly, no splenomegaly EXT:  2 plus pulses throughout, 2+ pitting edema to the knee bilaterally, no cyanosis no clubbing SKIN:  No rashes no nodules.  Well-healed midline R knee incision NEURO:  Cranial nerves II through XII grossly intact, motor grossly intact throughout PSYCH:  Cognitively intact, oriented to person place and time    EKG:  EKG is ordered today. The ekg ordered today demonstrates sinus rhythm at 93bpm.  2 pvc's.  LAFB.   Recent Labs: 10/14/2014: ALT 31 12/18/2014: BUN 21*; Creatinine, Ser 0.89; Potassium 4.0; Sodium 140 12/20/2014: Hemoglobin 9.4*; Platelets 140*    Lipid Panel No results found for: CHOL, TRIG, HDL, CHOLHDL, VLDL, LDLCALC, LDLDIRECT    Wt Readings from Last 3 Encounters:  03/17/15 128.731 kg (283 lb 12.8 oz)  12/31/14 127.098 kg (280 lb 3.2 oz)  12/23/14 127.461 kg (281 lb)      Other studies Reviewed: Additional studies/ records that  were reviewed today include: prior EKG Review of the above records demonstrates:  Please see elsewhere in the note.  Frequent PVCs on prior ECGs   ASSESSMENT AND PLAN:  # DOE: Mr Leiterman's DOE may be related to deconditioning and weight gain, as he reports gaining 15 lb recently.  However, he was getting better after surgery and then seemed to decline.  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  Will rule out structural heart disease and CAD with a dobutamine stress echo.  Patient is unable to exercise.  I think HF is unlikely despite his LE edema, as he has no JVD. - dobutamine stress echo.  Hopefully he will not have issues with arrhythmia, given his PVC burden.  If he has a lot of ectopy when he presents for stress, consider switching to a TTE and Lexiscan Myoview  # HTN: managed by PCP  # atrial fibrillation: managed by PCP  Current medicines are reviewed at length with the patient today.  The patient does not have concerns regarding medicines.  The following changes have been made:  no change  Labs/ tests ordered today include: dobutamine stress echo  No orders of the defined types were placed in this encounter.     Disposition:   FU with Dr. Oval Linsey in 3 months   Signed, Sharol Harness, MD  03/17/2015 2:34 PM    [place]

## 2015-03-17 ENCOUNTER — Ambulatory Visit (INDEPENDENT_AMBULATORY_CARE_PROVIDER_SITE_OTHER): Payer: Medicare Other | Admitting: Cardiovascular Disease

## 2015-03-17 ENCOUNTER — Encounter: Payer: Self-pay | Admitting: Cardiovascular Disease

## 2015-03-17 VITALS — BP 94/50 | HR 93 | Ht 70.0 in | Wt 283.8 lb

## 2015-03-17 DIAGNOSIS — R0609 Other forms of dyspnea: Secondary | ICD-10-CM | POA: Diagnosis not present

## 2015-03-17 DIAGNOSIS — R0600 Dyspnea, unspecified: Secondary | ICD-10-CM

## 2015-03-17 NOTE — Patient Instructions (Addendum)
SCHEDULE AT THE [facility] physician has requested that you have a stress echocardiogram. For further information please visit [email or web address]. Please follow instruction sheet as given.    NO CHANGE WITH CURRENT MEDICATIONS.   Your physician wants you to follow-up in [place].  You will receive a reminder letter in the mail two months in advance. If you don't receive a letter, please call our office to schedule the follow-up appointment.   Pharmacologic Stress Echocardiogram A pharmacologic stress echocardiogram is a heart (cardiac) test used to check the function of your heart. This test may also be called a pharmacologic stress echocardiography. Pharmacologic means that a medicine is used to increase your heart rate and blood pressure.  This stress test will check how well your heart muscle and valves are working and determine if your heart muscle is getting enough blood. Some people exercise on a treadmill, which naturally increases or stresses the functioning of their heart. For those people unable to exercise on a treadmill, a medicine is used. This medicine stimulates your heart and will cause your heart to beat harder and more quickly, as if you were exercising.  An echocardiogram uses sound waves (ultrasound) to produce an image of your heart. If your heart does not work normally, it may indicate coronary artery disease with poor coronary blood supply. The coronary arteries are the arteries that bring blood and oxygen to your heart. LET [facility] CARE PROVIDER KNOW ABOUT:  Any allergies you have.  All medicines you are taking, including vitamins, herbs, eye drops, creams, and over-the-counter medicines.  Previous problems you or members of your family have had with the use of anesthetics.  Any blood disorders you have.  Previous surgeries you have had.  Medical conditions you have.  Possibility of pregnancy, if this applies. RISKS AND  COMPLICATIONS Generally, this is a safe procedure. However, as with any procedure, complications can occur. Possible complications can include:  You develop pain or pressure in the following areas:  Chest.  Jaw or neck.  Between your shoulder blades.  Radiating down your left arm.  Headache.  Dizziness or light-headedness.  Shortness of breath.  Increased or irregular heartbeat.  Low blood pressure.  Nausea or vomiting.  Flushing.  Redness going up the arm and slight pain during injection of medicine.  Heart attack (rare). BEFORE THE PROCEDURE  Avoid all forms of caffeine for 24 hours before your test or as directed by your health care provider. This includes coffee, tea (even decaffeinated tea), caffeinated sodas, chocolate, cocoa, and certain pain medicines.  Follow your health care provider's instructions regarding eating and drinking before the test.  Take your medicines as directed at regular times with water unless instructed otherwise. Exceptions may include:  If you have diabetes, ask how you are to take your insulin or pills. It is common to adjust insulin dosing the morning of the test.  If you are taking beta-blocker medicines, it is important to talk to your health care provider about these medicines well before the date of your test. Taking beta-blocker medicines may interfere with the test. In some cases, these medicines need to be changed or stopped 24 hours or more before the test.  If you wear a nitroglycerin patch, it may need to be removed prior to the test. Ask your health care provider if the patch should be removed before the test.  If you use an inhaler for any breathing condition, bring it with you to the test.  If you are an outpatient, bring a snack so you can eat right after the stress phase of the test.  Do not smoke for 4 hours prior to the test or as directed by your health care provider.  Wear comfortable shoes and clothing. Let your  health care provider know if you were unable to complete or follow the preparations for your test. PROCEDURE   Multiple electrodes will be put on your chest. If needed, small areas of your chest may be shaved to get better contact with the electrodes. Once the electrodes are attached to your body, multiple wires will be attached to the electrodes, and your heart rate will be monitored.  An IV access will be started, and medicine will be given.  You will have an echocardiogram done at rest and done again at peak heart rate.  To produce an image of the heart, gel is applied to your chest, and a wand-like tool (transducer) is moved over the chest. The transducer sends the sound waves through the chest to create the moving images of your heart. AFTER THE PROCEDURE  Your heart rate and blood pressure will be monitored after the test.  You may return to your normal schedule, including diet, activities, and medicines, unless your health care provider tells you otherwise. Document Released: 10/23/2003 Document Revised: 08/07/2013 Document Reviewed: 04/09/2013 [facility] Patient Information 2015 [place], [place]. This information is not intended to replace advice given to you by your health care provider. Make sure you discuss any questions you have with your health care provider.

## 2015-03-25 ENCOUNTER — Telehealth (HOSPITAL_COMMUNITY): Payer: Self-pay | Admitting: *Deleted

## 2015-03-25 NOTE — Telephone Encounter (Signed)
Patient given detailed instructions per Stress Test Requisition Sheet for test on 03/27/15 at 7:30.Patient Notified to arrive 30 minutes early, and that it is imperative to arrive on time for appointment to keep from having the test rescheduled.  Patient verbalized understanding. Eric Mayer

## 2015-03-27 ENCOUNTER — Other Ambulatory Visit: Payer: Self-pay | Admitting: Cardiovascular Disease

## 2015-03-27 ENCOUNTER — Other Ambulatory Visit (HOSPITAL_COMMUNITY): Payer: Medicare Other | Admitting: *Deleted

## 2015-03-27 ENCOUNTER — Ambulatory Visit (HOSPITAL_COMMUNITY): Payer: Medicare Other | Attending: Internal Medicine

## 2015-03-27 DIAGNOSIS — R0600 Dyspnea, unspecified: Secondary | ICD-10-CM

## 2015-03-27 DIAGNOSIS — G4733 Obstructive sleep apnea (adult) (pediatric): Secondary | ICD-10-CM | POA: Insufficient documentation

## 2015-03-27 DIAGNOSIS — I480 Paroxysmal atrial fibrillation: Secondary | ICD-10-CM | POA: Diagnosis not present

## 2015-03-27 DIAGNOSIS — Z96659 Presence of unspecified artificial knee joint: Secondary | ICD-10-CM | POA: Diagnosis not present

## 2015-03-27 DIAGNOSIS — R0609 Other forms of dyspnea: Secondary | ICD-10-CM

## 2015-03-27 DIAGNOSIS — I10 Essential (primary) hypertension: Secondary | ICD-10-CM | POA: Insufficient documentation

## 2015-03-27 MED ORDER — SODIUM CHLORIDE 0.9 % IV SOLN
20.0000 ug/kg | Freq: Once | INTRAVENOUS | Status: AC
  Administered 2015-03-27: 20 ug/kg/min via INTRAVENOUS

## 2015-03-28 ENCOUNTER — Telehealth: Payer: Self-pay | Admitting: Cardiovascular Disease

## 2015-03-28 NOTE — Telephone Encounter (Signed)
-----   Message from [name], MD sent at 03/28/2015  5:47 PM EDT ----- Please notify patient that the stress test is normal. Continue with current treatment plan.

## 2015-03-28 NOTE — Telephone Encounter (Signed)
Spoke to patient. Result given . Verbalized understanding  

## 2015-03-28 NOTE — Telephone Encounter (Signed)
Returned call to patient 03/27/15 stress echo results not available.Advised Dr.Woodston's nurse Ivin Booty will call back next week with results.Stated he is feeling ok just wanting to know results.

## 2015-03-28 NOTE — Telephone Encounter (Signed)
Patient would like the results of his stress test done yesterday 03/27/15.

## 2015-04-27 NOTE — Progress Notes (Signed)
Patient ID: Eric Mayer, male   DOB: 27-Sep-1940, 74 y.o.   MRN: [id number]   12/31/14  Facility:  [facility] Home Location:  [place] Room Number: 703-P LEVEL OF CARE:  SNF (31)  Chief Complaint  Patient presents with  . Discharge Note    Osteoarthritis S/P right total knee arthroplasty, ADD, depression, PAF, urinary retention, hypertension and OSA    HISTORY OF PRESENT ILLNESS:  This is a 74 year old male who is for discharge home with home health PT for endurance and OT for ADLs. He has been admitted to [facility] on 12/20/14 from [facility] with osteoarthritis S/P right total knee arthroplasty. 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  Patient was admitted to this facility for short-term rehabilitation after the patient's recent hospitalization.  Patient has completed SNF rehabilitation and therapy has cleared the patient for discharge.  PAST MEDICAL HISTORY:  Past Medical History  Diagnosis Date  . Hypertension   . History of radiation therapy 1993    sarcoma of left groin, tx at [facility]  . PAF (paroxysmal atrial fibrillation)     prior CARDIOLOGIST-- DR Tressia Miners TURNER/   now monitored by dr Jenny Reichmann griffin  . Incomplete right bundle branch block   . History of sarcoma     1993  LEFT GOIN--  S/P SURGERY, RADIATION AND CHEMO IN [place]  . Hypogonadism in male   . History of peptic ulcer     1980's  . History of gastric bypass   . 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 Weakness of left leg     SECONDARY TO SURGICAL NERVE INJURY OF LEFT GOIN  . Nocturia   . Arthritis   . Wears glasses   . OSA on CPAP   . Depression   . Primary prostate adenocarcinoma DX 07/08/14   Gleason 7,  stage T1c    CURRENT MEDICATIONS: Reviewed per MAR/see medication list  Allergies  Allergen Reactions  . Ace Inhibitors Other (See Comments)    cough     REVIEW OF SYSTEMS:  GENERAL: no change in appetite, no fatigue, no weight changes, no fever, chills or weakness RESPIRATORY: no cough, SOB, DOE, wheezing, hemoptysis CARDIAC: no chest pain, or palpitations GI: no abdominal pain, diarrhea, constipation, heart burn, nausea or vomiting  PHYSICAL EXAMINATION  GENERAL: no acute distress, normal body habitus SKIN:  Right knee surgical incision is dry, no redness EYES: conjunctivae normal, sclerae normal, normal eye lids NECK: supple, trachea midline, no neck masses, no thyroid tenderness, no thyromegaly LYMPHATICS: no LAN in the neck, no supraclavicular LAN RESPIRATORY: breathing is even & unlabored, BS CTAB CARDIAC: RRR, no murmur,no extra heart sounds, BLE edema 1+ GI: abdomen soft, normal BS, no masses, no tenderness, no hepatomegaly, no splenomegaly EXTREMITIES: able to move X 4 extremities but not able to lift LLE without being lifted by his hands/arms PSYCHIATRIC: the patient is alert & oriented to person, affect & behavior appropriate  LABS/RADIOLOGY: Labs reviewed: 12/24/14  WBC 5.2 hemoglobin 9.0 hematocrit 27.4 MCV 92.6 platelet count 234 sodium 143 potassium 3.8 glucose 122 BUN 16 creatinine 0.72 calcium 8.7 Basic Metabolic Panel  Recent Labs  10/14/14 1248 12/05/14 1047 12/18/14 0615  NA 144 139 140  K 3.9 4.0 4.0  CL 113* 107 108  CO2 26 27 22   GLUCOSE 106* 105* 126*  BUN 18 14 21*  CREATININE 0.75 0.79 0.89  CALCIUM 8.9 9.1 8.4*   Liver Function Tests:  Recent Labs  10/14/14 1248  AST 19  ALT 31  ALKPHOS 92  BILITOT 1.0  PROT 6.2  ALBUMIN 3.8   CBC:  Recent Labs  12/05/14 1047 12/18/14 0615 12/19/14 0436 12/20/14 0505  WBC 6.9 8.0 7.6 8.7  NEUTROABS 4.9  --   --   --   HGB 12.4* 11.3* 10.0* 9.4*  HCT 38.4* 34.7* 30.8* 28.4*   MCV 95.3 94.0 94.2 94.0  PLT 189 178 141* 140*     ASSESSMENT/PLAN:  Osteoarthritis S/P right total knee arthroplasty - for home health PT and OT; continueTylenol 325 mg take 2 tabs = 650 mg PO BID PRN when necessary and Norco 5/325 mg 1-2 tabs by mouth every 4 hours when necessary for pain, not to exceed 4 gm of Tylenol/24 hour; and Robaxin 500 mg 1 tab by mouth every 6 hours when necessary for muscle spasm;  follow-up with Dr. Latanya Maudlin, orthopedic surgeon  ADD - continue Adderall XR 20 mg 1 by mouth daily PRN  Depression - mood is stable; continue Wellbutrin XL 300 mg by mouth every morning and Lexapro 10 mg by mouth daily; he reports being on this combination for a long time and was prescribed by Dr. Laurann Montana, his PCP  PAF - rate controlled; continue Pradaxa 150 mg by mouth every 12 hours, diltiazem 240 mg 24 hour 1 capsule by mouth every morning and metoprolol 25 mg by mouth twice a day  Urinary retention - continue Flomax 0.4 mg 1 by mouth daily  Hypertension - well controlled; continue losartan HCTZ 160-25 mg 1 tab by mouth every morning  OSA - CPAP at night  Anemia, acute blood loss - hemoglobin 9.0; stable    I have filled out patient's discharge paperwork and written prescriptions.  Patient will receive home health PT and OT.  Total discharge time: Less than 30 minutes  Discharge time involved coordination of the discharge process with Education officer, museum, nursing staff and therapy department. Medical justification for home health services verified.   [facility], NP [employer] [phone]

## 2015-05-13 ENCOUNTER — Other Ambulatory Visit: Payer: Self-pay | Admitting: Internal Medicine

## 2015-05-13 DIAGNOSIS — E278 Other specified disorders of adrenal gland: Secondary | ICD-10-CM

## 2015-05-13 DIAGNOSIS — E279 Disorder of adrenal gland, unspecified: Principal | ICD-10-CM

## 2015-05-15 ENCOUNTER — Ambulatory Visit
Admission: RE | Admit: 2015-05-15 | Discharge: 2015-05-15 | Disposition: A | Discharge status: Home / Self Care | Payer: Medicare Other | Source: Ambulatory Visit | Attending: Internal Medicine | Admitting: Internal Medicine

## 2015-05-15 DIAGNOSIS — E278 Other specified disorders of adrenal gland: Secondary | ICD-10-CM

## 2015-05-15 DIAGNOSIS — E279 Disorder of adrenal gland, unspecified: Principal | ICD-10-CM

## 2015-06-24 NOTE — Progress Notes (Signed)
Cardiology Office Note   Date:  06/25/2015   ID:  Eric Mayer, DOB 01/30/1941, MRN [id number]  PCP:  Eric Shelling, MD  Cardiologist:   Eric Harness, MD   Chief Complaint  Patient presents with  . Follow-up  . Chest Pain    no chest pain, no lightheadedness or dizziness  . Shortness of Breath    only when walking  . Edema    both ankles always have edema      Patient ID: Eric Mayer is a 74 y.o. male with HTN, atrial fibrillation, and prostate adenocarcinoma currently undergoing XRT here for follow up..   Interval history 06/25/15: [name] underwent a dobutamine stress echo that was negative for ischemia.  It also showed normal systolic function.  He continues to have shortness of breath. This occurs with minimal exertion. He thinks that it is been getting worse over the course of the last year. In the last year he's had several setbacks. He initially broke his knee and subsequently underwent right knee replacement. He's also been through cancer treatment. Each time he had prolonged periods of decreased mobility and he has been slowly trying to increase his strength and stamina.  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 He has not noted significant swelling of the right leg.  He denies orthopnea or PND.  He also denies chest pain, palpitations, lightheadedness, or dizziness.  [name] checks his blood pressure at home. Typically runs in the 120s to 130s over 70s. He does note that it increases after strenuous activity.  History of Present Illness 03/17/15: [name] presents for evaluation of dyspnea on exertion.  He had a knee replacement surgery 5/3 and was able to talk to the street from his house and perform PT.  However, in the last 2-3 weeks he feels more tired with walking and has to stop more frequently.  These episodes are associated with diaphoresis but he denies CP, nausea, lightheadedness or  dizziness.  The episodes last several minutes and improve with rest.  He feels completely drained and SOB.  [name] also denies orthopnea or PND.  His weight has increased 15lb over the last several months due to decreased physical activity.  [name]  has chronic L LE edema which is ongoing since his groin sarcoma several years ago.  The R leg has been swollen since his knee surgery.  He has not missed any doses of Pradaxa.  In the past he had similar symptoms in the setting of anemia, though this was assessed by his PCP and reportedly only mildly abnormal.  Dr. Lavone Mayer has been managing [name]'s atrial fibrillation.  He has been taking Pradaxa and is rate controlled with diltiazem and metoprolol.  To his knowledge his HR has been well-controlled in the recent history  Past Medical History  Diagnosis Date  . Hypertension   . History of radiation therapy 1993    sarcoma of left groin, tx at [facility]  . PAF (paroxysmal atrial fibrillation) ([place])     prior CARDIOLOGIST-- DR Tressia Miners TURNER/   now monitored by dr Jenny Reichmann griffin  . Incomplete right bundle branch block   . History of sarcoma     1993  LEFT GOIN--  S/P SURGERY, RADIATION AND CHEMO IN [place]  . Hypogonadism in male   . History of peptic ulcer     1980's  . History of gastric bypass   .  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 Weakness of left leg     SECONDARY TO SURGICAL NERVE INJURY OF LEFT GOIN  . Nocturia   . Arthritis   . Wears glasses   . OSA on CPAP   . Depression   . Primary prostate adenocarcinoma ([place]) DX 07/08/14    Gleason 7,  stage T1c    Past Surgical History  Procedure Laterality Date  . Intestinal bypass  1976    GASTRIC FOR OBESITY  . Patella fracture surgery Left x2  1995  . Prostate biopsy  06/2014  . Ventral hernia repair  1977  . Left goin sarcoma surgery  1992   [place]  . Colonoscopy   07-03-2002  . Transthoracic echocardiogram  08-08-2003    moderate LVH/  ef 55-65%/  mild MR/  moderate LAE/  trivial TR/  trivial pericardial effusion posterior to the heart  . Orif left arm fx  1986  . Tonsillectomy  as child  . Radioactive seed implant N/A 10/17/2014    Procedure: RADIOACTIVE SEED IMPLANT    ;  Surgeon: Ailene Rud, MD;  Location: [facility];  Service: Urology;  Laterality: N/A;   84 SEEDS IMPLANTED NO SEEDS FOUND IN BLADDER  . Cardiac catheterization  08-12-2003  dr Tressia Miners turner    Normal coronary arteries, normal wall motion, no sig. abnormalities  . Hernia repair      as child  . Total knee arthroplasty Right 12/17/2014    Procedure: TOTAL KNEE ARTHROPLASTY;  Surgeon: Melrose Nakayama, MD;  Location: [place];  Service: Orthopedics;  Laterality: Right;     Current Outpatient Prescriptions  Medication Sig Dispense Refill  . amphetamine-dextroamphetamine (ADDERALL XR) 20 MG 24 hr capsule Take 20 mg by mouth daily as needed (ADD).    [name] buPROPion (WELLBUTRIN XL) 300 MG 24 hr tablet Take 300 mg by mouth every morning.    . cholecalciferol (VITAMIN D) 1000 UNITS tablet Take 1,000 Units by mouth daily.    . Coenzyme Q10 (CO Q 10 PO) Take 300 mg by mouth every evening.     . Cyanocobalamin (VITAMIN B-12 IJ) Inject 1 mL as directed every 30 (thirty) days.     . dabigatran (PRADAXA) 150 MG CAPS capsule Take 1 capsule (150 mg total) by mouth every 12 (twelve) hours. 60 capsule 0  . diltiazem (TIAZAC) 240 MG 24 hr capsule Take 240 mg by mouth every morning.     . escitalopram (LEXAPRO) 10 MG tablet Take 10 mg by mouth daily.    . metoprolol (LOPRESSOR) 50 MG tablet Take 25 mg by mouth 2 (two) times daily.    . Multiple Vitamin (MULTIVITAMIN WITH MINERALS) TABS tablet Take 1 tablet by mouth daily.    . phenazopyridine (PYRIDIUM) 200 MG tablet Take 1 tablet (200 mg total) by mouth 3 (three) times daily as needed for pain. 30 tablet 2  . tamsulosin (FLOMAX) 0.4  MG CAPS capsule Take 1 capsule (0.4 mg total) by mouth daily. 30 capsule 1  . furosemide (LASIX) 40 MG tablet Take 1 tablet (40 mg total) by mouth 2 (two) times daily. And as directed 180 tablet 3  . valsartan (DIOVAN) 320 MG tablet Take 1 tablet (320 mg total) by mouth daily. 90 tablet 3   No current facility-administered medications for this visit.    Allergies:   Ace inhibitors  Social History:  The patient  reports that he has never smoked. He has never used smokeless tobacco. He reports that he does not drink alcohol or use illicit drugs.   Family History:  The patient's family history includes Atrial fibrillation in his father; Cancer in his sister; Liver disease in his mother; Renal Disease in his mother; Stroke in his mother.    ROS:  Please see the history of present illness.   Otherwise, review of systems are positive for none.   All other systems are reviewed and negative.    PHYSICAL EXAM: VS:  BP 144/66 mmHg  Pulse 74  Ht 5\' 8"  (1.727 m)  Wt 133.494 kg (294 lb 4.8 oz)  BMI 44.76 kg/m2 , BMI Body mass index is 44.76 kg/(m^2). GENERAL:  Well appearing HEENT:  Pupils equal round and reactive, fundi not visualized, oral mucosa unremarkable NECK:  JVP 1 cm below the mandible at 45 degrees, waveform within normal limits, carotid upstroke brisk and symmetric, no bruits, no thyromegaly LYMPHATICS:  No cervical,  adenopathy LUNGS:  Clear to auscultation bilaterally CHEST:  Unremarkable HEART:  RRR.  PMI not displaced or sustained,S1 and S2 within normal limits, no S3, no S4, no clicks, no rubs, no murmurs ABD:  Flat, positive bowel sounds normal in frequency in pitch, no bruits, no rebound, no guarding, no midline pulsatile mass, no hepatomegaly, no splenomegaly EXT:  2 plus pulses throughout, 2+ pitting edema to the knee bilaterally, no cyanosis no clubbing SKIN:  No rashes no nodules.  Well-healed midline R knee incision NEURO:  Cranial nerves II through XII grossly intact,  motor grossly intact throughout PSYCH:  Cognitively intact, oriented to person place and time    EKG:  EKG is not ordered today.  Recent Labs: 10/14/2014: ALT 31 12/18/2014: BUN 21*; Creatinine, Ser 0.89; Potassium 4.0; Sodium 140 12/20/2014: Hemoglobin 9.4*; Platelets 140*    Lipid Panel No results found for: CHOL, TRIG, HDL, CHOLHDL, VLDL, LDLCALC, LDLDIRECT    Wt Readings from Last 3 Encounters:  06/25/15 133.494 kg (294 lb 4.8 oz)  03/27/15 128.368 kg (283 lb)  03/17/15 128.731 kg (283 lb 12.8 oz)      Other studies Reviewed: Additional studies/ records that were reviewed today include: prior EKG Review of the above records demonstrates:  Please see elsewhere in the note.  Frequent PVCs on prior ECGs   ASSESSMENT AND PLAN:  # DOE:  Mr. Soderholm's stress echo is negative for ischemia. It also revealed normal systolic function. I'm suspicious that he may have some diastolic dysfunction. Therefore we will order a transthoracic echo and check a BNP. We will also start Lasix 40 mg by mouth twice a day for 2 weeks followed by 40 mg by mouth daily. We will have him check a basic metabolic panel today and again in 1 week to ensure that his renal function is stable  # Atrial fibrillation:   Mr. Brickey is currently in sinus rhythm. I recommended that he avoid taking Adderall if possible. He says that he only takes it occasionally, approximately once per month.  He will continue on Dabigatran.  This patients CHA2DS2-VASc Score and unadjusted Ischemic Stroke Rate (% per year) is equal to 2.2 % stroke rate/year from a score of 2  Above score calculated as 1 point each if present [CHF, HTN, DM, Vascular=MI/PAD/Aortic Plaque, Age if 65-74, or Male]  Above score calculated as 2 points each if present [Age > 75, or Stroke/TIA/TE]  # Hypertension:  Blood pressure  slightly above goal today. He is adamant that his blood pressure is well-controlled at home. We will be stopping his  hydrochlorothiazide in order to start furosemide. Therefore we will increase his losartan to 320 mg from 160 mg.   Current medicines are reviewed at length with the patient today.  The patient does not have concerns regarding medicines.  The following changes have been made:  Switch diovan to losartan 320 mg daily. Stop HCTZ. Start Lasix as prescribed above.  Labs/ tests ordered today include:  Orders Placed This Encounter  Procedures  . Basic metabolic panel  . Brain natriuretic peptide  . Basic metabolic panel  . ECHOCARDIOGRAM COMPLETE     Disposition:   FU with Dr. Oval Linsey in 1 month.  Repeat BMP in 1 week.   Signed, Eric Harness, MD  06/25/2015 1:34 PM    [facility]

## 2015-06-25 ENCOUNTER — Encounter: Payer: Self-pay | Admitting: Cardiovascular Disease

## 2015-06-25 ENCOUNTER — Ambulatory Visit (INDEPENDENT_AMBULATORY_CARE_PROVIDER_SITE_OTHER): Payer: Medicare Other | Admitting: Cardiovascular Disease

## 2015-06-25 VITALS — BP 144/66 | HR 74 | Ht 68.0 in | Wt 294.3 lb

## 2015-06-25 DIAGNOSIS — R600 Localized edema: Secondary | ICD-10-CM

## 2015-06-25 DIAGNOSIS — I10 Essential (primary) hypertension: Secondary | ICD-10-CM

## 2015-06-25 DIAGNOSIS — I480 Paroxysmal atrial fibrillation: Secondary | ICD-10-CM

## 2015-06-25 DIAGNOSIS — R0609 Other forms of dyspnea: Secondary | ICD-10-CM

## 2015-06-25 DIAGNOSIS — R0600 Dyspnea, unspecified: Secondary | ICD-10-CM

## 2015-06-25 DIAGNOSIS — R0602 Shortness of breath: Secondary | ICD-10-CM

## 2015-06-25 LAB — BASIC METABOLIC PANEL
BUN: 16 mg/dL (ref 7–25)
CO2: 26 mmol/L (ref 20–31)
Calcium: 8.8 mg/dL (ref 8.6–10.3)
Chloride: 112 mmol/L — ABNORMAL HIGH (ref 98–110)
Creat: 0.65 mg/dL — ABNORMAL LOW (ref 0.70–1.18)
Glucose, Bld: 102 mg/dL — ABNORMAL HIGH (ref 65–99)
Potassium: 4.5 mmol/L (ref 3.5–5.3)
Sodium: 145 mmol/L (ref 135–146)

## 2015-06-25 MED ORDER — VALSARTAN 320 MG PO TABS: 320.0000 mg | ORAL_TABLET | Freq: Every day | ORAL | Status: DC

## 2015-06-25 MED ORDER — FUROSEMIDE 40 MG PO TABS: 40.0000 mg | ORAL_TABLET | Freq: Two times a day (BID) | ORAL | Status: DC

## 2015-06-25 NOTE — Patient Instructions (Signed)
Your physician recommends that you schedule a follow-up appointment in 1 month with Dr Oval Linsey.  Stop valsartan-hctz  Start Valsartan 320 mg one daily Lasix (furosemide) 40 mg one tablet twice a day for 2 weeks then decrease to 1 tablet daily.  Labs today- BMP,BNP  ON 07/03/15 ,HAVE LAB DONE BMP--  Your physician has requested that you have an echocardiogram AT [place] [address].[name] Echocardiography is a painless test that uses sound waves to create images of your heart. It provides your doctor with information about the size and shape of your heart and how well your heart's chambers and valves are working. This procedure takes approximately one hour. There are no restrictions for this procedure.  If you need a refill on your cardiac medications before your next appointment, please call your pharmacy.

## 2015-06-26 LAB — BRAIN NATRIURETIC PEPTIDE: Brain Natriuretic Peptide: 115.5 pg/mL — ABNORMAL HIGH (ref 0.0–100.0)

## 2015-06-30 ENCOUNTER — Other Ambulatory Visit: Payer: Self-pay | Admitting: Cardiovascular Disease

## 2015-06-30 MED ORDER — VALSARTAN 320 MG PO TABS: 320.0000 mg | ORAL_TABLET | Freq: Every day | ORAL | Status: DC

## 2015-06-30 MED ORDER — FUROSEMIDE 40 MG PO TABS: 40.0000 mg | ORAL_TABLET | Freq: Two times a day (BID) | ORAL | Status: DC

## 2015-06-30 NOTE — Telephone Encounter (Signed)
°*  STAT* If patient is at the pharmacy, call can be transferred to refill team.   1. Which medications need to be refilled? (please list name of each medication and dose if known) Valsartan and Lasix-new prescription  2. Which pharmacy/location (including street and city if local pharmacy) is medication to be sent to?[facility] [phone]  3. Do they need a 30 day or 90 day supply? 90 and refills

## 2015-06-30 NOTE — Telephone Encounter (Signed)
Pt's pharmacy was sent to pt's pharmacy. Confirmation received.

## 2015-07-02 ENCOUNTER — Telehealth: Payer: Self-pay | Admitting: *Deleted

## 2015-07-02 NOTE — Telephone Encounter (Signed)
LEFT MESSAGE TO CALL BACK  RELEASE TO MY CHART  

## 2015-07-02 NOTE — Telephone Encounter (Signed)
-----   Message from [name], MD sent at 07/02/2015  8:09 AM EST ----- Normal kidney function and electrolytes.  We will compare this to the follow up this week.

## 2015-07-03 NOTE — Telephone Encounter (Signed)
Spoke to patient. Result given . Verbalized understanding  

## 2015-07-08 ENCOUNTER — Ambulatory Visit (HOSPITAL_COMMUNITY): Payer: Medicare Other | Attending: Internal Medicine

## 2015-07-08 ENCOUNTER — Other Ambulatory Visit: Payer: Self-pay

## 2015-07-08 DIAGNOSIS — I10 Essential (primary) hypertension: Secondary | ICD-10-CM | POA: Insufficient documentation

## 2015-07-08 DIAGNOSIS — I517 Cardiomegaly: Secondary | ICD-10-CM | POA: Insufficient documentation

## 2015-07-08 DIAGNOSIS — I351 Nonrheumatic aortic (valve) insufficiency: Secondary | ICD-10-CM | POA: Diagnosis not present

## 2015-07-08 DIAGNOSIS — R0609 Other forms of dyspnea: Secondary | ICD-10-CM | POA: Diagnosis not present

## 2015-07-08 DIAGNOSIS — R600 Localized edema: Secondary | ICD-10-CM | POA: Insufficient documentation

## 2015-07-08 DIAGNOSIS — I071 Rheumatic tricuspid insufficiency: Secondary | ICD-10-CM | POA: Insufficient documentation

## 2015-07-08 DIAGNOSIS — I340 Nonrheumatic mitral (valve) insufficiency: Secondary | ICD-10-CM | POA: Insufficient documentation

## 2015-07-08 DIAGNOSIS — I358 Other nonrheumatic aortic valve disorders: Secondary | ICD-10-CM | POA: Insufficient documentation

## 2015-07-08 DIAGNOSIS — R0602 Shortness of breath: Secondary | ICD-10-CM | POA: Insufficient documentation

## 2015-07-08 DIAGNOSIS — R0600 Dyspnea, unspecified: Secondary | ICD-10-CM

## 2015-07-17 ENCOUNTER — Telehealth: Payer: Self-pay | Admitting: *Deleted

## 2015-07-17 NOTE — Telephone Encounter (Signed)
-----   Message from [name], MD sent at 07/11/2015 12:35 PM EST ----- Echo shows mild leaking of the mitral and tricuspid valves.

## 2015-07-17 NOTE — Telephone Encounter (Signed)
Spoke to patient. Result given . Verbalized understanding  

## 2015-07-31 ENCOUNTER — Ambulatory Visit (INDEPENDENT_AMBULATORY_CARE_PROVIDER_SITE_OTHER): Payer: Medicare Other | Admitting: Cardiovascular Disease

## 2015-07-31 ENCOUNTER — Inpatient Hospital Stay (HOSPITAL_COMMUNITY)
Admission: AD | Admit: 2015-07-31 | Discharge: 2015-08-04 | DRG: 308 | Disposition: A | Discharge status: Home / Self Care | Payer: Medicare Other | Source: Ambulatory Visit | Attending: Cardiovascular Disease | Admitting: Cardiovascular Disease

## 2015-07-31 ENCOUNTER — Encounter (HOSPITAL_COMMUNITY): Payer: Self-pay | Admitting: General Practice

## 2015-07-31 ENCOUNTER — Encounter: Payer: Self-pay | Admitting: Cardiovascular Disease

## 2015-07-31 VITALS — BP 170/80 | HR 131 | Ht 70.0 in | Wt 311.4 lb

## 2015-07-31 DIAGNOSIS — Z9884 Bariatric surgery status: Secondary | ICD-10-CM | POA: Diagnosis not present

## 2015-07-31 DIAGNOSIS — Z6841 Body Mass Index (BMI) 40.0 and over, adult: Secondary | ICD-10-CM | POA: Diagnosis not present

## 2015-07-31 DIAGNOSIS — I5031 Acute diastolic (congestive) heart failure: Secondary | ICD-10-CM | POA: Insufficient documentation

## 2015-07-31 DIAGNOSIS — I480 Paroxysmal atrial fibrillation: Secondary | ICD-10-CM | POA: Diagnosis present

## 2015-07-31 DIAGNOSIS — Z7901 Long term (current) use of anticoagulants: Secondary | ICD-10-CM

## 2015-07-31 DIAGNOSIS — R0602 Shortness of breath: Secondary | ICD-10-CM

## 2015-07-31 DIAGNOSIS — I4892 Unspecified atrial flutter: Secondary | ICD-10-CM

## 2015-07-31 DIAGNOSIS — Z841 Family history of disorders of kidney and ureter: Secondary | ICD-10-CM

## 2015-07-31 DIAGNOSIS — I5033 Acute on chronic diastolic (congestive) heart failure: Secondary | ICD-10-CM | POA: Diagnosis present

## 2015-07-31 DIAGNOSIS — Z85831 Personal history of malignant neoplasm of soft tissue: Secondary | ICD-10-CM | POA: Diagnosis not present

## 2015-07-31 DIAGNOSIS — C61 Malignant neoplasm of prostate: Secondary | ICD-10-CM | POA: Diagnosis present

## 2015-07-31 DIAGNOSIS — Z823 Family history of stroke: Secondary | ICD-10-CM

## 2015-07-31 DIAGNOSIS — R609 Edema, unspecified: Secondary | ICD-10-CM

## 2015-07-31 DIAGNOSIS — I483 Typical atrial flutter: Secondary | ICD-10-CM

## 2015-07-31 DIAGNOSIS — Z8711 Personal history of peptic ulcer disease: Secondary | ICD-10-CM

## 2015-07-31 DIAGNOSIS — M1990 Unspecified osteoarthritis, unspecified site: Secondary | ICD-10-CM | POA: Diagnosis present

## 2015-07-31 DIAGNOSIS — R002 Palpitations: Secondary | ICD-10-CM | POA: Diagnosis present

## 2015-07-31 DIAGNOSIS — I10 Essential (primary) hypertension: Secondary | ICD-10-CM

## 2015-07-31 DIAGNOSIS — Z79899 Other long term (current) drug therapy: Secondary | ICD-10-CM

## 2015-07-31 DIAGNOSIS — G4733 Obstructive sleep apnea (adult) (pediatric): Secondary | ICD-10-CM | POA: Diagnosis present

## 2015-07-31 DIAGNOSIS — R600 Localized edema: Secondary | ICD-10-CM

## 2015-07-31 DIAGNOSIS — I5032 Chronic diastolic (congestive) heart failure: Secondary | ICD-10-CM | POA: Diagnosis present

## 2015-07-31 DIAGNOSIS — E876 Hypokalemia: Secondary | ICD-10-CM | POA: Diagnosis present

## 2015-07-31 DIAGNOSIS — Z888 Allergy status to other drugs, medicaments and biological substances status: Secondary | ICD-10-CM | POA: Diagnosis not present

## 2015-07-31 DIAGNOSIS — Z923 Personal history of irradiation: Secondary | ICD-10-CM

## 2015-07-31 DIAGNOSIS — I4891 Unspecified atrial fibrillation: Secondary | ICD-10-CM | POA: Diagnosis not present

## 2015-07-31 DIAGNOSIS — E669 Obesity, unspecified: Secondary | ICD-10-CM | POA: Diagnosis present

## 2015-07-31 DIAGNOSIS — Z809 Family history of malignant neoplasm, unspecified: Secondary | ICD-10-CM | POA: Diagnosis not present

## 2015-07-31 DIAGNOSIS — G4730 Sleep apnea, unspecified: Secondary | ICD-10-CM | POA: Diagnosis present

## 2015-07-31 HISTORY — DX: Chronic diastolic (congestive) heart failure: I50.32

## 2015-07-31 HISTORY — DX: Unspecified atrial flutter: I48.92

## 2015-07-31 HISTORY — DX: Malignant neoplasm of connective and soft tissue, unspecified: C49.9

## 2015-07-31 HISTORY — DX: Anemia, unspecified: D64.9

## 2015-07-31 LAB — CBC WITH DIFFERENTIAL/PLATELET
Basophils Absolute: 0 10*3/uL (ref 0.0–0.1)
Basophils Relative: 0 %
Eosinophils Absolute: 0.2 10*3/uL (ref 0.0–0.7)
Eosinophils Relative: 3 %
HCT: 36.4 % — ABNORMAL LOW (ref 39.0–52.0)
Hemoglobin: 11.3 g/dL — ABNORMAL LOW (ref 13.0–17.0)
Lymphocytes Relative: 24 %
Lymphs Abs: 1.4 10*3/uL (ref 0.7–4.0)
MCH: 30 pg (ref 26.0–34.0)
MCHC: 31 g/dL (ref 30.0–36.0)
MCV: 96.6 fL (ref 78.0–100.0)
Monocytes Absolute: 0.3 10*3/uL (ref 0.1–1.0)
Monocytes Relative: 4 %
Neutro Abs: 4 10*3/uL (ref 1.7–7.7)
Neutrophils Relative %: 69 %
Platelets: 183 10*3/uL (ref 150–400)
RBC: 3.77 MIL/uL — ABNORMAL LOW (ref 4.22–5.81)
RDW: 13.8 % (ref 11.5–15.5)
WBC: 5.9 10*3/uL (ref 4.0–10.5)

## 2015-07-31 LAB — COMPREHENSIVE METABOLIC PANEL
ALT: 26 U/L (ref 17–63)
AST: 18 U/L (ref 15–41)
Albumin: 3.3 g/dL — ABNORMAL LOW (ref 3.5–5.0)
Alkaline Phosphatase: 88 U/L (ref 38–126)
Anion gap: 7 (ref 5–15)
BUN: 14 mg/dL (ref 6–20)
CO2: 32 mmol/L (ref 22–32)
Calcium: 9.1 mg/dL (ref 8.9–10.3)
Chloride: 109 mmol/L (ref 101–111)
Creatinine, Ser: 0.82 mg/dL (ref 0.61–1.24)
GFR calc Af Amer: >60 mL/min (ref >60)
GFR calc non Af Amer: >60 mL/min (ref >60)
Glucose, Bld: 102 mg/dL — ABNORMAL HIGH (ref 65–99)
Potassium: 4.4 mmol/L (ref 3.5–5.1)
Sodium: 148 mmol/L — ABNORMAL HIGH (ref 135–145)
Total Bilirubin: 0.5 mg/dL (ref 0.3–1.2)
Total Protein: 5.8 g/dL — ABNORMAL LOW (ref 6.5–8.1)

## 2015-07-31 LAB — TROPONIN I: Troponin I: <0.03 ng/mL (ref <0.031)

## 2015-07-31 LAB — BRAIN NATRIURETIC PEPTIDE: B Natriuretic Peptide: 206.9 pg/mL — ABNORMAL HIGH (ref 0.0–100.0)

## 2015-07-31 LAB — TSH: TSH: 2.76 u[IU]/mL (ref 0.350–4.500)

## 2015-07-31 LAB — PROTIME-INR
INR: 1.22 (ref 0.00–1.49)
Prothrombin Time: 15.6 seconds — ABNORMAL HIGH (ref 11.6–15.2)

## 2015-07-31 LAB — MAGNESIUM: Magnesium: 1.5 mg/dL — ABNORMAL LOW (ref 1.7–2.4)

## 2015-07-31 MED ORDER — DILTIAZEM HCL 100 MG IV SOLR
5.0000 mg/h | INTRAVENOUS | Status: DC
  Administered 2015-07-31: 5 mg/h via INTRAVENOUS
  Filled 2015-07-31: qty 100

## 2015-07-31 MED ORDER — AMPHETAMINE-DEXTROAMPHET ER 10 MG PO CP24: 20.0000 mg | ORAL_CAPSULE | Freq: Every day | ORAL | Status: DC | PRN

## 2015-07-31 MED ORDER — DILTIAZEM LOAD VIA INFUSION
10.0000 mg | Freq: Once | INTRAVENOUS | Status: AC
  Administered 2015-07-31: 10 mg via INTRAVENOUS
  Filled 2015-07-31: qty 10

## 2015-07-31 MED ORDER — ENOXAPARIN SODIUM 40 MG/0.4ML ~~LOC~~ SOLN: 40.0000 mg | SUBCUTANEOUS | Status: DC

## 2015-07-31 MED ORDER — FUROSEMIDE 10 MG/ML IJ SOLN
40.0000 mg | Freq: Two times a day (BID) | INTRAMUSCULAR | Status: DC
  Administered 2015-07-31 – 2015-08-04 (×8): 40 mg via INTRAVENOUS
  Filled 2015-07-31 (×8): qty 4

## 2015-07-31 MED ORDER — IRBESARTAN 150 MG PO TABS
75.0000 mg | ORAL_TABLET | Freq: Every day | ORAL | Status: DC
  Administered 2015-08-01: 75 mg via ORAL
  Filled 2015-07-31: qty 1

## 2015-07-31 MED ORDER — CO Q 10 100 MG PO CAPS: 300.0000 mg | ORAL_CAPSULE | Freq: Every evening | ORAL | Status: DC

## 2015-07-31 MED ORDER — ADULT MULTIVITAMIN W/MINERALS CH
1.0000 | ORAL_TABLET | Freq: Every day | ORAL | Status: DC
  Administered 2015-08-01 – 2015-08-04 (×4): 1 via ORAL
  Filled 2015-07-31 (×5): qty 1

## 2015-07-31 MED ORDER — DILTIAZEM HCL ER BEADS 240 MG PO CP24
240.0000 mg | ORAL_CAPSULE | Freq: Every day | ORAL | Status: DC
  Administered 2015-07-31 – 2015-08-03 (×4): 240 mg via ORAL
  Filled 2015-07-31 (×8): qty 1

## 2015-07-31 MED ORDER — VITAMIN D 1000 UNITS PO TABS
1000.0000 [IU] | ORAL_TABLET | Freq: Every day | ORAL | Status: DC
  Administered 2015-08-01 – 2015-08-04 (×4): 1000 [IU] via ORAL
  Filled 2015-07-31 (×4): qty 1

## 2015-07-31 MED ORDER — BUPROPION HCL ER (XL) 300 MG PO TB24
300.0000 mg | ORAL_TABLET | Freq: Every morning | ORAL | Status: DC
  Administered 2015-08-01 – 2015-08-04 (×4): 300 mg via ORAL
  Filled 2015-07-31 (×4): qty 1

## 2015-07-31 MED ORDER — SODIUM CHLORIDE 0.9 % IJ SOLN
3.0000 mL | Freq: Two times a day (BID) | INTRAMUSCULAR | Status: DC
  Administered 2015-07-31 – 2015-08-03 (×4): 3 mL via INTRAVENOUS

## 2015-07-31 MED ORDER — METOPROLOL TARTRATE 25 MG PO TABS
25.0000 mg | ORAL_TABLET | Freq: Two times a day (BID) | ORAL | Status: DC
  Administered 2015-07-31 – 2015-08-04 (×8): 25 mg via ORAL
  Filled 2015-07-31 (×8): qty 1

## 2015-07-31 MED ORDER — ESCITALOPRAM OXALATE 10 MG PO TABS
10.0000 mg | ORAL_TABLET | Freq: Every day | ORAL | Status: DC
  Administered 2015-08-01 – 2015-08-04 (×4): 10 mg via ORAL
  Filled 2015-07-31 (×4): qty 1

## 2015-07-31 MED ORDER — ACETAMINOPHEN 325 MG PO TABS: 650.0000 mg | ORAL_TABLET | ORAL | Status: DC | PRN

## 2015-07-31 MED ORDER — TAMSULOSIN HCL 0.4 MG PO CAPS
0.4000 mg | ORAL_CAPSULE | Freq: Every day | ORAL | Status: DC
  Administered 2015-08-01 – 2015-08-04 (×4): 0.4 mg via ORAL
  Filled 2015-07-31 (×4): qty 1

## 2015-07-31 MED ORDER — DILTIAZEM HCL ER BEADS 240 MG PO CP24: 240.0000 mg | ORAL_CAPSULE | Freq: Every morning | ORAL | Status: DC

## 2015-07-31 MED ORDER — ONDANSETRON HCL 4 MG/2ML IJ SOLN: 4.0000 mg | Freq: Four times a day (QID) | INTRAMUSCULAR | Status: DC | PRN

## 2015-07-31 MED ORDER — SODIUM CHLORIDE 0.9 % IJ SOLN: 3.0000 mL | INTRAMUSCULAR | Status: DC | PRN

## 2015-07-31 MED ORDER — ALPRAZOLAM 0.25 MG PO TABS: 0.2500 mg | ORAL_TABLET | Freq: Two times a day (BID) | ORAL | Status: DC | PRN

## 2015-07-31 MED ORDER — SODIUM CHLORIDE 0.9 % IV SOLN: 250.0000 mL | INTRAVENOUS | Status: DC | PRN

## 2015-07-31 MED ORDER — ZOLPIDEM TARTRATE 5 MG PO TABS: 5.0000 mg | ORAL_TABLET | Freq: Every evening | ORAL | Status: DC | PRN

## 2015-07-31 MED ORDER — DABIGATRAN ETEXILATE MESYLATE 150 MG PO CAPS
150.0000 mg | ORAL_CAPSULE | Freq: Two times a day (BID) | ORAL | Status: DC
  Administered 2015-07-31 – 2015-08-04 (×8): 150 mg via ORAL
  Filled 2015-07-31 (×12): qty 1

## 2015-07-31 NOTE — Progress Notes (Signed)
Pt admitted from home with diagnosis of chf. He is noted with bilateral lower extremities edema, left > than right. He is noted with some dyspnea with activity. Pt was oriented to room and routines and using call light. He verbalized understanding. Initiated poc for [employer].

## 2015-07-31 NOTE — Patient Instructions (Addendum)
[facility] WILL CALL YOU TODAY WITH BED ASSIGNMENT.- for admission Atrial flutter, edema.   No other changes currently other Lasix 40 mg twice a day.   Your physician recommends that you schedule a follow-up appointment will be schedule after hospitalized.

## 2015-07-31 NOTE — Progress Notes (Signed)
Cardiology Office Note   Date:  07/31/2015   ID:  Eric Mayer, DOB May 12, 1941, MRN [id number]  PCP:  Irven Shelling, MD  Cardiologist:   Sharol Harness, MD   Chief Complaint  Patient presents with  . 1 month follow-up    ECHO//pt c/o minor chest discomfort, SOB on exertion, swelling in bilateral legs, feet, ankles, no other Sx.      Patient ID: Eric Mayer is a 74 y.o. male with HTN, atrial fibrillation, and prostate adenocarcinoma currently undergoing XRT here for follow up.  Interval history 07/31/15: [name] was referred for echocardiography, that revealed normal systolic function but was insufficient for evaluation of diastolic function.  There was mild MR/TR.  At his last appointment he was started on lasix twice daily for 2 weeks followed by daily for presumed diastolic dysfunction and shortness of breath with LE edema.  BNP was 115.  He was started on Lasix twice daily for 2 weeks followed by daily. He reports increased urinary frequency since starting Lasix. However, he has noted significant weight gain. His weight is 311 pounds today and was 294 when he was seen in November.  [name] has not been feeling well.  He has noted a few episodes of atrial fibrillation with rates in the 140-150s.  He has noted increased palpitations.  The episodes come and go and he is more short of breath.  He has dyspnea on exertion.  He is able to walk with a shopping cart but has difficulty walking without assistance.  He endorses orthopnea and PND.He has been sleeping upright in order to breathe.   Interval history 06/25/15: [name] underwent a dobutamine stress echo that was negative for ischemia.  It also showed normal systolic function.  He continues to have shortness of breath. This occurs with minimal exertion. He thinks that it is been getting worse over the course of the last year. In the last year he's had several setbacks. He initially broke his knee and  subsequently underwent right knee replacement. He's also been through cancer treatment. Each time he had prolonged periods of decreased mobility and he has been slowly trying to increase his strength and stamina.  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 He has not noted significant swelling of the right leg.  He denies orthopnea or PND.  He also denies chest pain, palpitations, lightheadedness, or dizziness.  [name] checks his blood pressure at home. Typically runs in the 120s to 130s over 70s. He does note that it increases after strenuous activity.  History of Present Illness 03/17/15: [name] presents for evaluation of dyspnea on exertion.  He had a knee replacement surgery 5/3 and was able to talk to the street from his house and perform PT.  However, in the last 2-3 weeks he feels more tired with walking and has to stop more frequently.  These episodes are associated with diaphoresis but he denies CP, nausea, lightheadedness or dizziness.  The episodes last several minutes and improve with rest.  He feels completely drained and SOB.  [name] also denies orthopnea or PND.  His weight has increased 15lb over the last several months due to decreased physical activity.  [name]  has chronic L LE edema which is ongoing since his groin sarcoma several years ago.  The R leg has been swollen since his knee surgery.  He has not missed any doses of  Pradaxa.  In the past he had similar symptoms in the setting of anemia, though this was assessed by his PCP and reportedly only mildly abnormal.  Dr. Lavone Mayer has been managing [name]'s atrial fibrillation.  He has been taking Pradaxa and is rate controlled with diltiazem and metoprolol.  To his knowledge his HR has been well-controlled in the recent history  Past Medical History  Diagnosis Date  . Hypertension   . History of radiation therapy 1993    sarcoma of left groin, tx at [facility]  .  PAF (paroxysmal atrial fibrillation) ([place])     prior CARDIOLOGIST-- DR Eric Mayer/   now monitored by dr Eric Mayer  . Incomplete right bundle branch block   . History of sarcoma     1993  LEFT GOIN--  S/P SURGERY, RADIATION AND CHEMO IN [place]  . Hypogonadism in male   . History of peptic ulcer     1980's  . History of gastric bypass   . 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 Weakness of left leg     SECONDARY TO SURGICAL NERVE INJURY OF LEFT GOIN  . Nocturia   . Arthritis   . Wears glasses   . OSA on CPAP   . Depression   . Primary prostate adenocarcinoma ([place]) DX 07/08/14    Gleason 7,  stage T1c  . Atrial flutter ([place]) 07/31/2015    Past Surgical History  Procedure Laterality Date  . Intestinal bypass  1976    GASTRIC FOR OBESITY  . Patella fracture surgery Left x2  1995  . Prostate biopsy  06/2014  . Ventral hernia repair  1977  . Left goin sarcoma surgery  1992   [place]  . Colonoscopy  07-03-2002  . Transthoracic echocardiogram  08-08-2003    moderate LVH/  ef 55-65%/  mild MR/  moderate LAE/  trivial TR/  trivial pericardial effusion posterior to the heart  . Orif left arm fx  1986  . Tonsillectomy  as child  . Radioactive seed implant N/A 10/17/2014    Procedure: RADIOACTIVE SEED IMPLANT    ;  Surgeon: Ailene Rud, MD;  Location: [facility];  Service: Urology;  Laterality: N/A;   69 SEEDS IMPLANTED NO SEEDS FOUND IN BLADDER  . Cardiac catheterization  08-12-2003  dr Eric Mayer    Normal coronary arteries, normal wall motion, no sig. abnormalities  . Hernia repair      as child  . Total knee arthroplasty Right 12/17/2014    Procedure: TOTAL KNEE ARTHROPLASTY;  Surgeon: Melrose Nakayama, MD;  Location: [place];  Service: Orthopedics;  Laterality: Right;     No current outpatient prescriptions on file.   No current facility-administered  medications for this visit.    Allergies:   Ace inhibitors    Social History:  The patient  reports that he has never smoked. He has never used smokeless tobacco. He reports that he does not drink alcohol or use illicit drugs.   Family History:  The patient's family history includes Atrial fibrillation in his father; Cancer in his sister; Liver disease in his mother; Renal Disease in his mother; Stroke in his mother.    ROS:  Please see the history of present illness.   Otherwise, review of systems are positive for none.   All other systems are reviewed and negative.  PHYSICAL EXAM: VS:  BP 170/80 mmHg  Pulse 131  Ht 5\' 10"  (1.778 m)  Wt 141.25 kg (311 lb 6.4 oz)  BMI 44.68 kg/m2 , BMI Body mass index is 44.68 kg/(m^2). GENERAL:  Well appearing HEENT:  Pupils equal round and reactive, fundi not visualized, oral mucosa unremarkable NECK:  JVP at earlobe, waveform within normal limits.  +HJR.  carotid upstroke brisk and symmetric, no bruits, no thyromegaly LYMPHATICS:  No cervical,  adenopathy LUNGS:  Clear to auscultation bilaterally CHEST:  Unremarkable HEART:  Tachycardic.  Regular rhythm.  PMI not displaced or sustained,S1 and S2 within normal limits, no S3, no S4, no clicks, no rubs, no murmurs ABD:  Flat, positive bowel sounds normal in frequency in pitch, no bruits, no rebound, no guarding, no midline pulsatile mass, no hepatomegaly, no splenomegaly EXT:  2 plus pulses throughout, 2+ pitting edema to the knee bilaterally, no cyanosis no clubbing SKIN:  No rashes no nodules.  Well-healed midline R knee incision NEURO:  Cranial nerves II through XII grossly intact, motor grossly intact throughout PSYCH:  Cognitively intact, oriented to person place and time  EKG:  EKG is ordered today. Atrial flutter with 2:1 conduction.  Ventricular rate 131 bpm.  Frequent PVCs.     Echo 07/08/15: Study Conclusions  - Left ventricle: The cavity size was normal. Wall thickness  was increased in a pattern of moderate LVH. Systolic function was normal. The estimated ejection fraction was in the range of 60% to 65%. The study is not technically sufficient to allow evaluation of LV diastolic function. - Aortic valve: Trileaflet. Sclerosis without stenosis. There was mild regurgitation. - Mitral valve: Mildly thickened leaflets . There was mild regurgitation. - Left atrium: Moderately dilated at 43 ml/m2. - Right atrium: The atrium was mildly dilated. - Tricuspid valve: There was mild regurgitation. - Pulmonary arteries: PA peak pressure: 31 mm Hg (S). - Inferior vena cava: The vessel was normal in size. The respirophasic diameter changes were in the normal range (= 50%), consistent with normal central venous pressure.  Impressions:  - LVEF 60-65%, moderate LVH, normal wall motion, aortic valve sclerosis with mild AI, MR and TR, RVSP 31 mmHg., moderate LAE, mild RAE.   Recent Labs: 10/14/2014: ALT 31 12/20/2014: Hemoglobin 9.4*; Platelets 140* 06/25/2015: BUN 16; Creat 0.65*; Potassium 4.5; Sodium 145    Lipid Panel No results found for: CHOL, TRIG, HDL, CHOLHDL, VLDL, LDLCALC, LDLDIRECT    Wt Readings from Last 3 Encounters:  07/31/15 141.25 kg (311 lb 6.4 oz)  06/25/15 133.494 kg (294 lb 4.8 oz)  03/27/15 128.368 kg (283 lb)      Other studies Reviewed: Additional studies/ records that were reviewed today include: prior EKG Review of the above records demonstrates:  Please see elsewhere in the note.  Frequent PVCs on prior ECGs   ASSESSMENT AND PLAN:  # Atrial fibrillation/flutter:   Mr. Bernosky is currently in atrial flutter.  I suspect that his poorly-controlled atrial fibrillation/flutter is what is causing his worsening heart failure.  He will need to be cardioverted to restore sinus rhythm.  He may need an antiarrhythmic to maintain sinus rhyhtm.  He has not missed any doses of Pradaxa. - Admission to cardiology - Continue  Pradaxa - Continue metoprolol and diltiazem - DCCV tomorrow if possible   # Acute on chronic diastolic heart failure:  Mr. Koberstein His volume overloaded today.  We will admit him for diuresis and DCCV. - continue metorprolol, diltiazem and valsartan - Diuresis with  IV lasix  This patients CHA2DS2-VASc Score and unadjusted Ischemic Stroke Rate (% per year) is equal to 2.2 % stroke rate/year from a score of 2  Above score calculated as 1 point each if present [CHF, HTN, DM, Vascular=MI/PAD/Aortic Plaque, Age if 65-74, or Male]  Above score calculated as 2 points each if present [Age > 75, or Stroke/TIA/TE]  # Hypertension:  Blood pressure above goal today. He is adamant that his blood pressure is well-controlled at home.  - Continue diovan, metoprolol and diltiazem - Consider switching to carvedilol if his BP is elevated in the hospital  Current medicines are reviewed at length with the patient today.  The patient does not have concerns regarding medicines.  The following changes have been made:  None  Labs/ tests ordered today include:  Orders Placed This Encounter  Procedures  . ELECTRICAL CARDIOVERSION  . EKG 12-Lead     Disposition:   FU with Dr. Oval Linsey 1-2 weeks after discharge.   Signed, Sharol Harness, MD  07/31/2015 4:52 PM    [place]

## 2015-07-31 NOTE — Progress Notes (Signed)
PT HR sustaining in the 130's at rest. Pt asymptomatic. MD paged. New orders given. Will continue to monitor closely.  Raliegh Ip RN

## 2015-07-31 NOTE — H&P (Signed)
Patient ID: KIEFER SICKLES MRN: [id number] DOB/AGE: 72-Jun-1942 74 y.o.  Admit date: 07/31/2015 Primary Physician   Irven Shelling, MD Primary Cardiologist   French Kendra C. Oval Linsey, MD, [facility]  Chief Complaint   Atrial flutter   HPI: Mr. Barraco is a 74M with chronic diastolic heart failure, PAF, HTN, and prostate adenocarcinoma currently undergoing XRT admitted from clinic with acute on chronic heart failure and atrial flutter.   Mr. Prejean was seen in clinic on 11/9 with a complaint of SOB.  He underwent dobutamine stress echo that was negative for ischemia and had normal systolic function.  Mr. Hitchner was referred for echocardiography, that revealed normal systolic function but was insufficient for evaluation of diastolic function. There was mild MR/TR. At his last appointment he was started on lasix twice daily for 2 weeks followed by daily for presumed diastolic dysfunction and shortness of breath with LE edema. BNP was 115. He was started on Lasix twice daily for 2 weeks followed by daily. He reports increased urinary frequency since starting Lasix. However, he has noted significant weight gain. His weight is 311 pounds today and was 294 when he was seen in November. Mr. Bihl has not been feeling well. He has noted a few episodes of atrial fibrillation with rates in the 140-150s. He has noted increased palpitations. The episodes come and go and he is more short of breath. He has dyspnea on exertion. He is able to walk with a shopping cart but has difficulty walking without assistance. He endorses orthopnea and PND.He has been sleeping upright in order to breathe.      Review of Systems:   A 12 point review of systems was obtained and was negative with exceptions noted in the HPI.  Past Medical History  Diagnosis Date  . Hypertension   . History of radiation therapy 1993    sarcoma of left groin, tx at [facility]  . PAF (paroxysmal atrial fibrillation) ([place])     prior  CARDIOLOGIST-- DR Tressia Miners TURNER/   now monitored by dr Jenny Reichmann griffin  . Incomplete right bundle branch block   . History of sarcoma     1993  LEFT GOIN--  S/P SURGERY, RADIATION AND CHEMO IN [place]  . Hypogonadism in male   . History of peptic ulcer     1980's  . History of gastric bypass   . 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 Weakness of left leg     SECONDARY TO SURGICAL NERVE INJURY OF LEFT GOIN  . Nocturia   . Arthritis   . Wears glasses   . OSA on CPAP   . Depression   . Primary prostate adenocarcinoma ([place]) DX 07/08/14    Gleason 7,  stage T1c  . Atrial flutter ([place]) 07/31/2015    Medications Prior to Admission  Medication Sig Dispense Refill  . amphetamine-dextroamphetamine (ADDERALL XR) 20 MG 24 hr capsule Take 20 mg by mouth daily as needed (ADD).    [name] buPROPion (WELLBUTRIN XL) 300 MG 24 hr tablet Take 300 mg by mouth every morning.    . cholecalciferol (VITAMIN D) 1000 UNITS tablet Take 1,000 Units by mouth daily.    . Coenzyme Q10 (CO Q 10 PO) Take 300 mg by mouth every evening.     . Cyanocobalamin (VITAMIN B-12 IJ) Inject 1 mL as directed every 30 (thirty) days.     [name]  dabigatran (PRADAXA) 150 MG CAPS capsule Take 1 capsule (150 mg total) by mouth every 12 (twelve) hours. 60 capsule 0  . diltiazem (TIAZAC) 240 MG 24 hr capsule Take 240 mg by mouth every morning.     . escitalopram (LEXAPRO) 10 MG tablet Take 10 mg by mouth daily.    . furosemide (LASIX) 40 MG tablet Take 1 tablet (40 mg total) by mouth 2 (two) times daily. And as directed 180 tablet 3  . metoprolol (LOPRESSOR) 50 MG tablet Take 25 mg by mouth 2 (two) times daily.    . Multiple Vitamin (MULTIVITAMIN WITH MINERALS) TABS tablet Take 1 tablet by mouth daily.    . phenazopyridine (PYRIDIUM) 200 MG tablet Take 1 tablet (200 mg total) by mouth 3 (three) times daily as needed for pain. 30 tablet 2  . tamsulosin  (FLOMAX) 0.4 MG CAPS capsule Take 1 capsule (0.4 mg total) by mouth daily. 30 capsule 1  . valsartan (DIOVAN) 320 MG tablet Take 1 tablet (320 mg total) by mouth daily. 90 tablet 3     Allergies  Allergen Reactions  . Ace Inhibitors Other (See Comments)    cough    Social History   Social History  . Marital Status: Married    Spouse Name: N/A  . Number of Children: N/A  . Years of Education: N/A   Occupational History  . Not on file.   Social History Main Topics  . Smoking status: Never Smoker   . Smokeless tobacco: Never Used  . Alcohol Use: No  . Drug Use: No  . Sexual Activity: Not on file   Other Topics Concern  . Not on file   Social History Narrative    Family History  Problem Relation Age of Onset  . Liver disease Mother   . Renal Disease Mother   . Stroke Mother   . Atrial fibrillation Father   . Cancer Sister     PHYSICAL EXAM: Filed Vitals:   07/31/15 1627  BP: 135/85  Pulse: 132  Temp: 98.1 F (36.7 C)  Resp: 20   GENERAL: Well appearing HEENT: Pupils equal round and reactive, fundi not visualized, oral mucosa unremarkable NECK: JVP at earlobe, waveform within normal limits. +HJR. carotid upstroke brisk and symmetric, no bruits, no thyromegaly LYMPHATICS: No cervical, adenopathy LUNGS: Clear to auscultation bilaterally CHEST: Unremarkable HEART: Tachycardic. Regular rhythm. PMI not displaced or sustained,S1 and S2 within normal limits, no S3, no S4, no clicks, no rubs, no murmurs ABD: Flat, positive bowel sounds normal in frequency in pitch, no bruits, no rebound, no guarding, no midline pulsatile mass, no hepatomegaly, no splenomegaly EXT: 2 plus pulses throughout, 2+ pitting edema to the knee bilaterally, no cyanosis no clubbing SKIN: No rashes no nodules. Well-healed midline R knee incision NEURO: Cranial nerves II through XII grossly intact, motor grossly intact throughout PSYCH: Cognitively intact, oriented to person  place and time   No results found for this or any previous visit (from the past 24 hour(s)). No results found.   ECG: Atrial flutter with 2:1 conduction. Ventricular rate 131 bpm. Frequent PVCs.     ASSESSMENT/PLAN:  # Atrial fibrillation/flutter: Mr. Haran is currently in atrial flutter. I suspect that his poorly-controlled atrial fibrillation/flutter is what is causing his worsening heart failure. He will need to be cardioverted to restore sinus rhythm. He may need an antiarrhythmic to maintain sinus rhyhtm. He has not missed any doses of Pradaxa. - Admission to cardiology - Continue Pradaxa - Continue metoprolol  and diltiazem - DCCV tomorrow if possible   # Acute on chronic diastolic heart failure: Mr. Padalino His volume overloaded today. We will admit him for diuresis and DCCV. - continue metorprolol, diltiazem and valsartan - Diuresis with IV lasix  This patients CHA2DS2-VASc Score and unadjusted Ischemic Stroke Rate (% per year) is equal to 2.2 % stroke rate/year from a score of 2  Above score calculated as 1 point each if present [CHF, HTN, DM, Vascular=MI/PAD/Aortic Plaque, Age if 65-74, or Male] Above score calculated as 2 points each if present [Age > 75, or Stroke/TIA/TE]  # Hypertension: Blood pressure above goal today. He is adamant that his blood pressure is well-controlled at home.  - Continue diovan, metoprolol and diltiazem - Consider switching to carvedilol if his BP is elevated in the hospital    Signed: Darroll Bredeson C. Oval Linsey, MD, [facility]  07/31/2015, 4:53 PM

## 2015-08-01 ENCOUNTER — Inpatient Hospital Stay (HOSPITAL_COMMUNITY): Payer: Medicare Other

## 2015-08-01 DIAGNOSIS — R609 Edema, unspecified: Secondary | ICD-10-CM

## 2015-08-01 DIAGNOSIS — I5031 Acute diastolic (congestive) heart failure: Secondary | ICD-10-CM

## 2015-08-01 LAB — BASIC METABOLIC PANEL
Anion gap: 7 (ref 5–15)
BUN: 13 mg/dL (ref 6–20)
CO2: 32 mmol/L (ref 22–32)
Calcium: 8.8 mg/dL — ABNORMAL LOW (ref 8.9–10.3)
Chloride: 104 mmol/L (ref 101–111)
Creatinine, Ser: 0.85 mg/dL (ref 0.61–1.24)
GFR calc Af Amer: >60 mL/min (ref >60)
GFR calc non Af Amer: >60 mL/min (ref >60)
Glucose, Bld: 104 mg/dL — ABNORMAL HIGH (ref 65–99)
Potassium: 3.8 mmol/L (ref 3.5–5.1)
Sodium: 143 mmol/L (ref 135–145)

## 2015-08-01 LAB — TROPONIN I
Troponin I: <0.03 ng/mL (ref <0.031)
Troponin I: <0.03 ng/mL (ref <0.031)

## 2015-08-01 MED ORDER — IRBESARTAN 150 MG PO TABS
150.0000 mg | ORAL_TABLET | Freq: Every day | ORAL | Status: DC
  Administered 2015-08-02 – 2015-08-04 (×3): 150 mg via ORAL
  Filled 2015-08-01 (×3): qty 1

## 2015-08-01 NOTE — Progress Notes (Signed)
Utilization review completed.  

## 2015-08-01 NOTE — Progress Notes (Addendum)
Subjective:  Breathing better. He admits to an increased HR for almost the past month; with rates up to 130 for the past 2 weeks.  Objective:   Vital Signs : Filed Vitals:   08/01/15 0012 08/01/15 0534 08/01/15 0855 08/01/15 1356  BP: 127/92 115/66 133/58 116/57  Pulse:  62  61  Temp:  97.6 F (36.4 C)  97.6 F (36.4 C)  TempSrc:  Oral  Oral  Resp:  18    Height:      Weight:  295 lb 14.4 oz (134.219 kg)    SpO2:  93%  95%    Intake/Output from previous day:  Intake/Output Summary (Last 24 hours) at 08/01/15 1416 Last data filed at 08/01/15 1356  Gross per 24 hour  Intake 264.22 ml  Output   6225 ml  Net -5960.78 ml    I/O since admission: - 5960.8  Wt Readings from Last 3 Encounters:  08/01/15 295 lb 14.4 oz (134.219 kg)  07/31/15 311 lb 6.4 oz (141.25 kg)  06/25/15 294 lb 4.8 oz (133.494 kg)    Medications: . buPROPion  300 mg Oral q morning - 10a  . cholecalciferol  1,000 Units Oral Daily  . dabigatran  150 mg Oral Q12H  . diltiazem  240 mg Oral QHS  . escitalopram  10 mg Oral Daily  . furosemide  40 mg Intravenous BID  . irbesartan  75 mg Oral Daily  . metoprolol  25 mg Oral BID  . multivitamin with minerals  1 tablet Oral Daily  . sodium chloride  3 mL Intravenous Q12H  . tamsulosin  0.4 mg Oral Daily    . diltiazem (CARDIZEM) infusion 5 mg/hr (08/01/15 1055)    Physical Exam:   General appearance: alert, cooperative and no distress Neck: no adenopathy, supple, symmetrical, trachea midline, thyroid not enlarged, symmetric, no tenderness/mass/nodules and JVDat 8 cm Lungs: decreased BS Heart: irregularly irregular rhythm Abdomen: soft, non-tender; bowel sounds normal; no masses,  no organomegaly Extremities: 2-3+ edema on left and 2+ on right lower extremity Pulses: 2+ and symmetric Skin:  Neurologic: Grossly normal   Rate: 85  Rhythm: atrial fibrillation  ECG (independently read by me): yesterday probable AFlutter at 128  Lab  Results:   Recent Labs  07/31/15 1820 08/01/15 0425  NA 148* 143  K 4.4 3.8  CL 109 104  CO2 32 32  GLUCOSE 102* 104*  BUN 14 13  CREATININE 0.82 0.85  CALCIUM 9.1 8.8*  MG 1.5*  --     Hepatic Function Latest Ref Rng 07/31/2015 10/14/2014  Total Protein 6.5 - 8.1 g/dL 5.8(L) 6.2  Albumin 3.5 - 5.0 g/dL 3.3(L) 3.8  AST 15 - 41 U/L 18 19  ALT 17 - 63 U/L 26 31  Alk Phosphatase 38 - 126 U/L 88 92  Total Bilirubin 0.3 - 1.2 mg/dL 0.5 1.0     Recent Labs  07/31/15 1820  WBC 5.9  NEUTROABS 4.0  HGB 11.3*  HCT 36.4*  MCV 96.6  PLT 183     Recent Labs  07/31/15 1820 07/31/15 2322 08/01/15 0425  TROPONINI <0.03 <0.03 <0.03    Lab Results  Component Value Date   TSH 2.760 07/31/2015   No results for input(s): HGBA1C in the last 72 hours.   Recent Labs  07/31/15 1820  PROT 5.8*  ALBUMIN 3.3*  AST 18  ALT 26  ALKPHOS 88  BILITOT 0.5    Recent Labs  07/31/15 1820  INR 1.22  BNP (last 3 results)  Recent Labs  07/31/15 1820  BNP 206.9*    ProBNP (last 3 results) No results for input(s): PROBNP in the last 8760 hours.   Lipid Panel  No results found for: CHOL, TRIG, HDL, CHOLHDL, VLDL, LDLCALC, LDLDIRECT   Imaging:  Dg Chest 2 View  08/01/2015  CLINICAL DATA:  Shortness of breath and bilateral lower extremity edema, history of CHF, atrial fibrillation, and pericardial effusion. EXAM: CHEST  2 VIEW COMPARISON:  PA and lateral chest x-ray of September 17, 2014 FINDINGS: The lungs are adequately inflated. The interstitial markings remain increased bilaterally but are not greatly changed. The cardiac silhouette is enlarged. There is a new small left pleural effusion. The bony thorax is unremarkable. IMPRESSION: CHF with mild pulmonary interstitial edema and new small left pleural effusion. Electronically Signed   By: David  Martinique M.D.   On: 08/01/2015 08:01    07/08/2015 Echo Study Conclusions:  - Left ventricle: The cavity size was normal.  Wall thickness was increased in a pattern of moderate LVH. Systolic function was normal. The estimated ejection fraction was in the range of 60% to 65%. The study is not technically sufficient to allow evaluation of LV diastolic function. - Aortic valve: Trileaflet. Sclerosis without stenosis. There was mild regurgitation. - Mitral valve: Mildly thickened leaflets . There was mild regurgitation. - Left atrium: Moderately dilated at 43 ml/m2. - Right atrium: The atrium was mildly dilated. - Tricuspid valve: There was mild regurgitation. - Pulmonary arteries: PA peak pressure: 31 mm Hg (S). - Inferior vena cava: The vessel was normal in size. The respirophasic diameter changes were in the normal range (= 50%), consistent with normal central venous pressure.  Impressions:  - LVEF 60-65%, moderate LVH, normal wall motion, aortic valve sclerosis with mild AI, MR and TR, RVSP 31 mmHg., moderate LAE, mild RAE.  STRESS ECHO:  - This is intrepreted as a normal stress echo. No evidence of ischemia. Normal LV systolic function.  Assessment/Plan:   Principal Problem:   Acute on chronic diastolic CHF (congestive heart failure), NYHA class 4 (HCC) Active Problems:   PAF (paroxysmal atrial fibrillation) (HCC)   Acute diastolic CHF (congestive heart failure), NYHA class 4 ([place])   1. Atrial fibrillation: When seen yesterday in the office with Dr. Oval Linsey ECG suggested atrial flutter.  Telemetry today shows definite atrial fibrillation with a ventricular rate that is improved now in the 80s to 90s compared to ~ 130 bpm. I suspect that his poorly-controlled atrial fibrillation/flutter is what is causing his worsening heart failure.By clinical history, it is suggestive that he may have been in an abnormal rhythm for 2-4 weeks.  He will need to be cardioverted to restore sinus rhythm. He may need an antiarrhythmic to maintain sinus rhyhtm. His atrium is dilated at 43  ML's per meter squared per volume assessment.  He has not missed any doses of Pradaxa.  Consider initiation of amiodarone at 200 mg twice a day or flecanide.  - Continue Pradaxa - Continue metoprolol and diltiazem - DCCV not able to be arranged for today; Since patient has been on chronic anticoagulation therapy, and has not missed a dose of Pradaxa, cardioversion without TEE is a consideration.  2. Acute on chronic diastolic heart failure: Excellent diuresis since admission; - 5960; continue metorprolol, diltiazem.  He was on valsartan prior to admission, but apparently was started on irbesartan 75 mg yesterday in the hospital.  I will further titrate this to 150 mg today. -  Diuresis with IV lasix  This patients CHA2DS2-VASc Score and unadjusted Ischemic Stroke Rate (% per year) is equal to 2.2 % stroke rate/year from a score of 2  Above score calculated as 1 point each if present [CHF, HTN, DM, Vascular=MI/PAD/Aortic Plaque, Age if 65-74, or Male] Above score calculated as 2 points each if present [Age > 75, or Stroke/TIA/TE]  3.Hypertension: Blood pressure was elevated yesterday . He is adamant that his blood pressure is well-controlled at home.  - Continue ARB, metoprolol and diltiazem - Consider switching to carvedilol if his BP is elevated in the hospital  4. OSA on CPAP at home: will need to institute CPAP while here and will set up for CPAP auto with mask of choice tonight.  5. Peripheral edema: He continues to have 3+ lower extremity edema on the left and 2+ on the right.  Will continue with IV diuresis; consider support stockings.    Troy Sine, MD, [facility] 08/01/2015, 2:16 PM

## 2015-08-01 NOTE — Care Management Note (Signed)
Case Management Note Marvetta Gibbons RN, BSN Unit 2W-Case Manager [phone]  Patient Details  Name: Eric Mayer MRN: [id number] Date of Birth: [birth year]  Subjective/Objective:   Pt admitted with afib and CHF                 Action/Plan: PTA pt lived at home- anticipate return home- may benefit from [facility] for disease management- CM to follow  Expected Discharge Date:                  Expected Discharge Plan:  Home/Self Care  In-House Referral:     Discharge planning Services  CM Consult  Post Acute Care Choice:    Choice offered to:     DME Arranged:    DME Agency:     HH Arranged:    [place] Agency:     Status of Service:  In process, will continue to follow  Medicare Important Message Given:    Date Medicare IM Given:    Medicare IM give by:    Date Additional Medicare IM Given:    Additional Medicare Important Message give by:     If discussed at [place] of Stay Meetings, dates discussed:    Additional Comments:  Dawayne Patricia, RN 08/01/2015, 11:15 AM

## 2015-08-02 DIAGNOSIS — G4733 Obstructive sleep apnea (adult) (pediatric): Secondary | ICD-10-CM

## 2015-08-02 DIAGNOSIS — I4891 Unspecified atrial fibrillation: Secondary | ICD-10-CM

## 2015-08-02 LAB — BASIC METABOLIC PANEL
Anion gap: 8 (ref 5–15)
BUN: 14 mg/dL (ref 6–20)
CO2: 32 mmol/L (ref 22–32)
Calcium: 8.7 mg/dL — ABNORMAL LOW (ref 8.9–10.3)
Chloride: 102 mmol/L (ref 101–111)
Creatinine, Ser: 0.85 mg/dL (ref 0.61–1.24)
GFR calc Af Amer: >60 mL/min (ref >60)
GFR calc non Af Amer: >60 mL/min (ref >60)
Glucose, Bld: 100 mg/dL — ABNORMAL HIGH (ref 65–99)
Potassium: 3.2 mmol/L — ABNORMAL LOW (ref 3.5–5.1)
Sodium: 142 mmol/L (ref 135–145)

## 2015-08-02 LAB — BRAIN NATRIURETIC PEPTIDE: B Natriuretic Peptide: 103.2 pg/mL — ABNORMAL HIGH (ref 0.0–100.0)

## 2015-08-02 MED ORDER — POTASSIUM CHLORIDE 20 MEQ PO PACK
40.0000 meq | PACK | Freq: Every day | ORAL | Status: DC
  Filled 2015-08-02: qty 2

## 2015-08-02 MED ORDER — POTASSIUM CHLORIDE CRYS ER 20 MEQ PO TBCR
40.0000 meq | EXTENDED_RELEASE_TABLET | Freq: Every day | ORAL | Status: DC
Start: 2015-08-02 — End: 2015-08-04
  Administered 2015-08-02 – 2015-08-04 (×3): 40 meq via ORAL
  Filled 2015-08-02 (×3): qty 2

## 2015-08-02 NOTE — Progress Notes (Addendum)
SUBJECTIVE: Pt feeling much better. Says left leg swelling which has always been worse due to surgery for cancer has markedly improved.     Intake/Output Summary (Last 24 hours) at 08/02/15 1218 Last data filed at 08/02/15 0900  Gross per 24 hour  Intake 501.42 ml  Output   2550 ml  Net -2048.58 ml    Current Facility-Administered Medications  Medication Dose Route Frequency Provider Last Rate Last Dose  . 0.9 %  sodium chloride infusion  250 mL Intravenous PRN Rhonda G Barrett, PA-C      . acetaminophen (TYLENOL) tablet 650 mg  650 mg Oral Q4H PRN Rhonda G Barrett, PA-C      . ALPRAZolam (XANAX) tablet 0.25 mg  0.25 mg Oral BID PRN Evelene Croon Barrett, PA-C      . amphetamine-dextroamphetamine (ADDERALL XR) 24 hr capsule 20 mg  20 mg Oral Daily PRN Rhonda G Barrett, PA-C      . buPROPion (WELLBUTRIN XL) 24 hr tablet 300 mg  300 mg Oral q morning - 10a Rhonda G Barrett, PA-C   300 mg at 08/01/15 1225  . cholecalciferol (VITAMIN D) tablet 1,000 Units  1,000 Units Oral Daily Evelene Croon Barrett, PA-C   1,000 Units at 08/02/15 1057  . dabigatran (PRADAXA) capsule 150 mg  150 mg Oral Q12H Rhonda G Barrett, PA-C   150 mg at 08/02/15 1056  . diltiazem (CARDIZEM) 100 mg in dextrose 5 % 100 mL (1 mg/mL) infusion  5-15 mg/hr Intravenous Titrated Sueanne Margarita, MD   Stopped at 08/01/15 1441  . diltiazem (TIAZAC) 24 hr capsule 240 mg  240 mg Oral QHS [name], MD   240 mg at 08/01/15 2225  . escitalopram (LEXAPRO) tablet 10 mg  10 mg Oral Daily Rhonda G Barrett, PA-C   10 mg at 08/02/15 1056  . furosemide (LASIX) injection 40 mg  40 mg Intravenous BID Evelene Croon Barrett, PA-C   40 mg at 08/02/15 0848  . irbesartan (AVAPRO) tablet 150 mg  150 mg Oral Daily Troy Sine, MD   150 mg at 08/02/15 1057  . metoprolol tartrate (LOPRESSOR) tablet 25 mg  25 mg Oral BID Evelene Croon Barrett, PA-C   25 mg at 08/02/15 1058  . multivitamin with minerals tablet 1 tablet  1 tablet Oral Daily Evelene Croon  Barrett, PA-C   1 tablet at 08/02/15 1056  . ondansetron (ZOFRAN) injection 4 mg  4 mg Intravenous Q6H PRN Rhonda G Barrett, PA-C      . sodium chloride 0.9 % injection 3 mL  3 mL Intravenous Q12H Rhonda G Barrett, PA-C   3 mL at 08/02/15 0852  . sodium chloride 0.9 % injection 3 mL  3 mL Intravenous PRN Rhonda G Barrett, PA-C      . tamsulosin (FLOMAX) capsule 0.4 mg  0.4 mg Oral Daily Rhonda G Barrett, PA-C   0.4 mg at 08/02/15 1056  . zolpidem (AMBIEN) tablet 5 mg  5 mg Oral QHS PRN Lonn Georgia, PA-C        Filed Vitals:   08/01/15 0855 08/01/15 1356 08/01/15 2034 08/02/15 0356  BP: 133/58 116/57 118/50 127/58  Pulse:  61 78 58  Temp:  97.6 F (36.4 C) 98 F (36.7 C) 97.5 F (36.4 C)  TempSrc:  Oral Oral Oral  Resp:   18 20  Height:      Weight:    288 lb 6.4 oz (130.817 kg)  SpO2:  95% 95% 95%    PHYSICAL EXAM General: NAD HEENT: Normal. Neck: No JVD, no thyromegaly.  Lungs: Diminished, no rales or wheezes. CV: Irregular rhythm, normal S1/S2, no S3, no murmur. 2-3+ edema on left and 2+ on right lower extremity  Abdomen: Soft, nontender, obese.  Neurologic: Alert and oriented x 3.  Psych: Normal affect. Musculoskeletal: No gross deformities. Extremities: No clubbing or cyanosis.   TELEMETRY: Reviewed telemetry pt in rate-controlled atrial fibrillation.  LABS: Basic Metabolic Panel:  Recent Labs  07/31/15 1820 08/01/15 0425 08/02/15 0425  NA 148* 143 142  K 4.4 3.8 3.2*  CL 109 104 102  CO2 32 32 32  GLUCOSE 102* 104* 100*  BUN 14 13 14   CREATININE 0.82 0.85 0.85  CALCIUM 9.1 8.8* 8.7*  MG 1.5*  --   --    Liver Function Tests:  Recent Labs  07/31/15 1820  AST 18  ALT 26  ALKPHOS 88  BILITOT 0.5  PROT 5.8*  ALBUMIN 3.3*   No results for input(s): LIPASE, AMYLASE in the last 72 hours. CBC:  Recent Labs  07/31/15 1820  WBC 5.9  NEUTROABS 4.0  HGB 11.3*  HCT 36.4*  MCV 96.6  PLT 183   Cardiac Enzymes:  Recent Labs  07/31/15 1820  07/31/15 2322 08/01/15 0425  TROPONINI <0.03 <0.03 <0.03   BNP: Invalid input(s): POCBNP D-Dimer: No results for input(s): DDIMER in the last 72 hours. Hemoglobin A1C: No results for input(s): HGBA1C in the last 72 hours. Fasting Lipid Panel: No results for input(s): CHOL, HDL, LDLCALC, TRIG, CHOLHDL, LDLDIRECT in the last 72 hours. Thyroid Function Tests:  Recent Labs  07/31/15 1820  TSH 2.760   Anemia Panel: No results for input(s): VITAMINB12, FOLATE, FERRITIN, TIBC, IRON, RETICCTPCT in the last 72 hours.  RADIOLOGY: Dg Chest 2 View  08/01/2015  CLINICAL DATA:  Shortness of breath and bilateral lower extremity edema, history of CHF, atrial fibrillation, and pericardial effusion. EXAM: CHEST  2 VIEW COMPARISON:  PA and lateral chest x-ray of September 17, 2014 FINDINGS: The lungs are adequately inflated. The interstitial markings remain increased bilaterally but are not greatly changed. The cardiac silhouette is enlarged. There is a new small left pleural effusion. The bony thorax is unremarkable. IMPRESSION: CHF with mild pulmonary interstitial edema and new small left pleural effusion. Electronically Signed   By: David  Martinique M.D.   On: 08/01/2015 08:01      ASSESSMENT AND PLAN: 1. Atrial fibrillation: When seen on 12/15 in the office with Dr. Oval Linsey, ECG suggested atrial flutter. Yesterday telemetry showed definite atrial fibrillation with a ventricular rate that has steadily improved, now 60-70 bpm range compared to ~ 130 bpm. I suspect that his poorly-controlled atrial fibrillation/flutter is what is causing his worsening heart failure.By clinical history, it is suggestive that he may have been in an abnormal rhythm for 2-4 weeks. He will need to be cardioverted to restore sinus rhythm. He may need an antiarrhythmic to maintain sinus rhyhtm. His atrium is dilated at 43 ML's per meter squared per volume assessment. He has not missed any doses of Pradaxa. Consider  initiation of amiodarone at 200 mg twice a day or flecanide.  - Continue Pradaxa - Continue metoprolol and diltiazem - DCCV consideration on Monday Since patient has been on chronic anticoagulation therapy, and has not missed a dose of Pradaxa, cardioversion without TEE is a consideration.  2. Acute on chronic diastolic heart failure: Excellent diuresis since admission; approximately 7.5 L; continue metorprolol, diltiazem.Continue  ARB 150 mg as BP controlled. - Diuresis with IV lasix -Hypokalemic, will supplement daily  This patients CHA2DS2-VASc Score and unadjusted Ischemic Stroke Rate (% per year) is equal to 2.2 % stroke rate/year from a score of 2  Above score calculated as 1 point each if present [CHF, HTN, DM, Vascular=MI/PAD/Aortic Plaque, Age if 65-74, or Male] Above score calculated as 2 points each if present [Age > 75, or Stroke/TIA/TE]  3.Hypertension: Controlled. - Continue ARB, metoprolol and diltiazem - Consider switching to carvedilol if his BP is elevated in the hospital  4. OSA on CPAP at home: CPAP.  5. Peripheral edema: He continues to have 3+ lower extremity edema on the left and 2+ on the right. Will continue with IV diuresis; consider support stockings.    Kate Sable, M.D., F.A.C.C.

## 2015-08-03 DIAGNOSIS — I480 Paroxysmal atrial fibrillation: Secondary | ICD-10-CM

## 2015-08-03 DIAGNOSIS — E876 Hypokalemia: Secondary | ICD-10-CM

## 2015-08-03 LAB — BASIC METABOLIC PANEL
Anion gap: 9 (ref 5–15)
BUN: 15 mg/dL (ref 6–20)
CO2: 33 mmol/L — ABNORMAL HIGH (ref 22–32)
Calcium: 8.8 mg/dL — ABNORMAL LOW (ref 8.9–10.3)
Chloride: 98 mmol/L — ABNORMAL LOW (ref 101–111)
Creatinine, Ser: 0.91 mg/dL (ref 0.61–1.24)
GFR calc Af Amer: >60 mL/min (ref >60)
GFR calc non Af Amer: >60 mL/min (ref >60)
Glucose, Bld: 98 mg/dL (ref 65–99)
Potassium: 3.5 mmol/L (ref 3.5–5.1)
Sodium: 140 mmol/L (ref 135–145)

## 2015-08-03 LAB — MAGNESIUM: Magnesium: 1.5 mg/dL — ABNORMAL LOW (ref 1.7–2.4)

## 2015-08-03 LAB — PROTIME-INR
INR: 1.32 (ref 0.00–1.49)
Prothrombin Time: 16.5 seconds — ABNORMAL HIGH (ref 11.6–15.2)

## 2015-08-03 MED ORDER — SODIUM CHLORIDE 0.9 % IJ SOLN: 3.0000 mL | INTRAMUSCULAR | Status: DC | PRN

## 2015-08-03 MED ORDER — SODIUM CHLORIDE 0.9 % IV SOLN: 250.0000 mL | INTRAVENOUS | Status: DC

## 2015-08-03 MED ORDER — SODIUM CHLORIDE 0.9 % IJ SOLN: 3.0000 mL | Freq: Two times a day (BID) | INTRAMUSCULAR | Status: DC

## 2015-08-03 NOTE — Progress Notes (Signed)
SUBJECTIVE: Pt feeling much better. Says left leg swelling which has always been worse due to surgery for cancer has markedly improved. No chest pain or SOB.   Intake/Output Summary (Last 24 hours) at 08/03/15 1221 Last data filed at 08/03/15 1100  Gross per 24 hour  Intake   1200 ml  Output   2225 ml  Net  -1025 ml    Current Facility-Administered Medications  Medication Dose Route Frequency Provider Last Rate Last Dose  . 0.9 %  sodium chloride infusion  250 mL Intravenous PRN Rhonda G Barrett, PA-C      . acetaminophen (TYLENOL) tablet 650 mg  650 mg Oral Q4H PRN Rhonda G Barrett, PA-C      . ALPRAZolam (XANAX) tablet 0.25 mg  0.25 mg Oral BID PRN Evelene Croon Barrett, PA-C      . amphetamine-dextroamphetamine (ADDERALL XR) 24 hr capsule 20 mg  20 mg Oral Daily PRN Rhonda G Barrett, PA-C      . buPROPion (WELLBUTRIN XL) 24 hr tablet 300 mg  300 mg Oral q morning - 10a Rhonda G Barrett, PA-C   300 mg at 08/03/15 1134  . cholecalciferol (VITAMIN D) tablet 1,000 Units  1,000 Units Oral Daily Evelene Croon Barrett, PA-C   1,000 Units at 08/03/15 1134  . dabigatran (PRADAXA) capsule 150 mg  150 mg Oral Q12H Rhonda G Barrett, PA-C   150 mg at 08/03/15 1130  . diltiazem (CARDIZEM) 100 mg in dextrose 5 % 100 mL (1 mg/mL) infusion  5-15 mg/hr Intravenous Titrated Sueanne Margarita, MD   Stopped at 08/01/15 1441  . diltiazem (TIAZAC) 24 hr capsule 240 mg  240 mg Oral QHS [name], MD   240 mg at 08/02/15 2147  . escitalopram (LEXAPRO) tablet 10 mg  10 mg Oral Daily Evelene Croon Barrett, PA-C   10 mg at 08/03/15 1130  . furosemide (LASIX) injection 40 mg  40 mg Intravenous BID Rhonda G Barrett, PA-C   40 mg at 08/03/15 1134  . irbesartan (AVAPRO) tablet 150 mg  150 mg Oral Daily Troy Sine, MD   150 mg at 08/03/15 1134  . metoprolol tartrate (LOPRESSOR) tablet 25 mg  25 mg Oral BID Evelene Croon Barrett, PA-C   25 mg at 08/03/15 1134  . multivitamin with minerals tablet 1 tablet  1 tablet Oral Daily  Lonn Georgia, PA-C   1 tablet at 08/03/15 1202  . ondansetron (ZOFRAN) injection 4 mg  4 mg Intravenous Q6H PRN Rhonda G Barrett, PA-C      . potassium chloride SA (K-DUR,KLOR-CON) CR tablet 40 mEq  40 mEq Oral Daily Herminio Commons, MD   40 mEq at 08/03/15 1130  . sodium chloride 0.9 % injection 3 mL  3 mL Intravenous Q12H Rhonda G Barrett, PA-C   3 mL at 08/03/15 1144  . sodium chloride 0.9 % injection 3 mL  3 mL Intravenous PRN Rhonda G Barrett, PA-C      . tamsulosin (FLOMAX) capsule 0.4 mg  0.4 mg Oral Daily Rhonda G Barrett, PA-C   0.4 mg at 08/03/15 1130  . zolpidem (AMBIEN) tablet 5 mg  5 mg Oral QHS PRN Lonn Georgia, PA-C       Filed Vitals:   08/02/15 1436 08/02/15 2016 08/03/15 0402 08/03/15 1130  BP: 113/44 123/67 112/54 120/52  Pulse: 74 80 57 60  Temp: 97.7 F (36.5 C) 97.5 F (36.4 C) 97.8 F (36.6 C)   TempSrc: Oral Oral  Oral   Resp: 20 18 18    Height:      Weight:   286 lb 11.2 oz (130.046 kg)   SpO2: 96% 94% 93% 100%   PHYSICAL EXAM General: NAD HEENT: Normal. Neck: No JVD, no thyromegaly.  Lungs: Diminished, no rales or wheezes. CV: Irregular rhythm, normal S1/S2, no S3, no murmur. 2-3+ edema on left and 2+ on right lower extremity  Abdomen: Soft, nontender, obese.  Neurologic: Alert and oriented x 3.  Psych: Normal affect. Musculoskeletal: No gross deformities. Extremities: No clubbing or cyanosis.   TELEMETRY: Reviewed telemetry pt in rate-controlled atrial fibrillation.  LABS: Basic Metabolic Panel:  Recent Labs  07/31/15 1820  08/02/15 0425 08/03/15 0210  NA 148*  < > 142 140  K 4.4  < > 3.2* 3.5  CL 109  < > 102 98*  CO2 32  < > 32 33*  GLUCOSE 102*  < > 100* 98  BUN 14  < > 14 15  CREATININE 0.82  < > 0.85 0.91  CALCIUM 9.1  < > 8.7* 8.8*  MG 1.5*  --   --   --   < > = values in this interval not displayed.  Liver Function Tests:  Recent Labs  07/31/15 1820  AST 18  ALT 26  ALKPHOS 88  BILITOT 0.5  PROT 5.8*    ALBUMIN 3.3*    Recent Labs  07/31/15 1820  WBC 5.9  NEUTROABS 4.0  HGB 11.3*  HCT 36.4*  MCV 96.6  PLT 183   Cardiac Enzymes:  Recent Labs  07/31/15 1820 07/31/15 2322 08/01/15 0425  TROPONINI <0.03 <0.03 <0.03    Recent Labs  07/31/15 1820  TSH 2.760   RADIOLOGY: Dg Chest 2 View  08/01/2015  CLINICAL DATA:  Shortness of breath and bilateral lower extremity edema, history of CHF, atrial fibrillation, and pericardial effusion. EXAM: CHEST  2 VIEW COMPARISON:  PA and lateral chest x-ray of September 17, 2014 FINDINGS: The lungs are adequately inflated. The interstitial markings remain increased bilaterally but are not greatly changed. The cardiac silhouette is enlarged. There is a new small left pleural effusion. The bony thorax is unremarkable. IMPRESSION: CHF with mild pulmonary interstitial edema and new small left pleural effusion. Electronically Signed   By: David  Martinique M.D.   On: 08/01/2015 08:01     ASSESSMENT AND PLAN:  1. Atrial fibrillation: When seen on 12/15 in the office with Dr. Oval Linsey, ECG suggested atrial flutter. Yesterday telemetry showed definite atrial fibrillation with a ventricular rate that has steadily improved, now 50-70 bpm range compared to ~ 130 bpm. I suspect that his poorly-controlled atrial fibrillation/flutter is what is causing his worsening heart failure.By clinical history, it is suggestive that he may have been in an abnormal rhythm for 2-4 weeks. He will need to be cardioverted to restore sinus rhythm. We will plan for a DCCV in the am, no TEE needed as he was compliant with Pradaxa. He may need an antiarrhythmic to maintain sinus rhythm id he returns back to a-fib.  2. Acute on chronic diastolic heart failure: Excellent diuresis since admission; approximately 8.5 L; continue metorprolol, diltiazem.Continue ARB 150 mg as BP controlled.He is still fluid overloaded, continue diuresis with IV lasix. -Hypokalemic, will supplement  daily  This patients CHA2DS2-VASc Score and unadjusted Ischemic Stroke Rate (% per year) is equal to 2.2 % stroke rate/year from a score of 2  Above score calculated as 1 point each if present [CHF, HTN, DM, Vascular=MI/PAD/Aortic  Plaque, Age if 64-74, or Male] Above score calculated as 2 points each if present [Age > 75, or Stroke/TIA/TE]  3.Hypertension: Controlled. - Continue ARB, metoprolol and diltiazem - Consider switching to carvedilol if his BP is elevated in the hospital  4. OSA on CPAP at home: CPAP.  5. Peripheral edema: He continues to have 3+ lower extremity edema on the left and 2+ on the right. Will continue with IV diuresis; consider support stockings.  Dorothy Spark 08/03/2015

## 2015-08-04 ENCOUNTER — Inpatient Hospital Stay (HOSPITAL_COMMUNITY): Payer: Medicare Other | Admitting: Certified Registered Nurse Anesthetist

## 2015-08-04 ENCOUNTER — Encounter (HOSPITAL_COMMUNITY): Payer: Self-pay | Admitting: Cardiovascular Disease

## 2015-08-04 ENCOUNTER — Encounter (HOSPITAL_COMMUNITY)
Admission: AD | Disposition: A | Discharge status: Home / Self Care | Payer: Self-pay | Source: Ambulatory Visit | Attending: Cardiovascular Disease

## 2015-08-04 DIAGNOSIS — Z7901 Long term (current) use of anticoagulants: Secondary | ICD-10-CM

## 2015-08-04 DIAGNOSIS — I10 Essential (primary) hypertension: Secondary | ICD-10-CM | POA: Diagnosis present

## 2015-08-04 DIAGNOSIS — E669 Obesity, unspecified: Secondary | ICD-10-CM | POA: Diagnosis present

## 2015-08-04 DIAGNOSIS — G4730 Sleep apnea, unspecified: Secondary | ICD-10-CM | POA: Diagnosis present

## 2015-08-04 HISTORY — PX: CARDIOVERSION: SHX1299

## 2015-08-04 SURGERY — CARDIOVERSION
Anesthesia: General

## 2015-08-04 MED ORDER — POTASSIUM CHLORIDE CRYS ER 20 MEQ PO TBCR: 20.0000 meq | EXTENDED_RELEASE_TABLET | Freq: Every day | ORAL | Status: DC

## 2015-08-04 MED ORDER — LIDOCAINE HCL (CARDIAC) 20 MG/ML IV SOLN
INTRAVENOUS | Status: DC | PRN
  Administered 2015-08-04: 100 mg via INTRAVENOUS

## 2015-08-04 MED ORDER — PROPOFOL 10 MG/ML IV BOLUS
INTRAVENOUS | Status: DC | PRN
  Administered 2015-08-04: 80 mg via INTRAVENOUS

## 2015-08-04 MED ORDER — SODIUM CHLORIDE 0.9 % IV SOLN: 250.0000 mL | INTRAVENOUS | Status: DC

## 2015-08-04 MED ORDER — SODIUM CHLORIDE 0.9 % IJ SOLN: 3.0000 mL | INTRAMUSCULAR | Status: DC | PRN

## 2015-08-04 MED ORDER — ACETAMINOPHEN 325 MG PO TABS: 650.0000 mg | ORAL_TABLET | ORAL | Status: DC | PRN

## 2015-08-04 MED ORDER — SODIUM CHLORIDE 0.9 % IV SOLN
INTRAVENOUS | Status: DC | PRN
  Administered 2015-08-04: 10:00:00 via INTRAVENOUS

## 2015-08-04 MED ORDER — IRBESARTAN 300 MG PO TABS: 300.0000 mg | ORAL_TABLET | Freq: Every day | ORAL | Status: DC

## 2015-08-04 MED ORDER — MAGNESIUM OXIDE 400 MG PO CAPS: 1.0000 | ORAL_CAPSULE | Freq: Every day | ORAL | Status: DC

## 2015-08-04 MED ORDER — SODIUM CHLORIDE 0.9 % IJ SOLN
3.0000 mL | Freq: Two times a day (BID) | INTRAMUSCULAR | Status: DC
  Administered 2015-08-04: 3 mL via INTRAVENOUS

## 2015-08-04 NOTE — CV Procedure (Signed)
Electrical Cardioversion Procedure Note BERT GETZ [id number] 1941/04/16  Procedure: Electrical Cardioversion Indications:  Atrial Fibrillation  Procedure Details Consent: Risks of procedure as well as the alternatives and risks of each were explained to the (patient/caregiver).  Consent for procedure obtained. Time Out: Verified patient identification, verified procedure, site/side was marked, verified correct patient position, special equipment/implants available, medications/allergies/relevent history reviewed, required imaging and test results available.  Performed  Patient placed on cardiac monitor, pulse oximetry, supplemental oxygen as necessary.  Sedation given: propofol 80 mg, lidocaine 40 mg Pacer pads placed anterior and posterior chest.  Cardioverted 1 time(s).  Cardioverted at 150J.  Evaluation Findings: Post procedure EKG shows: NSR Complications: None Patient did tolerate procedure well.   Sharol Harness, MD 08/04/2015, 10:09 AM

## 2015-08-04 NOTE — Discharge Summary (Signed)
Patient ID: Eric Mayer,  MRN: [id number], DOB/AGE: [birth year] 74 y.o.  Admit date: 07/31/2015 Discharge date: 08/04/2015  Primary Care Provider: Irven Shelling, MD Primary Cardiologist: Dr Oval Linsey  Discharge Diagnoses Principal Problem:   Acute on chronic diastolic CHF (congestive heart failure), NYHA class 4 (HCC) Active Problems:   PAF (paroxysmal atrial fibrillation) (HCC)   Essential hypertension   Malignant neoplasm of prostate (HCC)   Chronic anticoagulation   Obesity-BMI 46   Sleep apnea-on C-Pap    Procedures: DCCV 08/04/15   Hospital Course:  74 y/o obese M with chronic diastolic heart failure, OSA- on C pap, PAF, HTN, and prostate adenocarcinoma currently undergoing XRT. The patient was seen in clinic on 06/25/15 with a complaint of SOB. He underwent dobutamine stress echo that was negative for ischemia and had normal systolic function.Echocardiography, revealed normal systolic function but was insufficient for evaluation of diastolic function. There was mild MR/TR. His lasix was adjusted as an OP but he continued to gain wgt and have DOE and edema. He also had noted episodic AF as an OP and when he was seen in the office 07/31/15 he was in 2:1 flutter. Dr Oval Linsey felt this was contributing to his CHF. He was admitted for IV diuresis and consideration for cardioversion. He had been on Pardaxa as an OP and reported missing no doses. The pt diuresed 12L and lost 17 lbs with IV Lasix BID.  He had a DCCV to NSR on 08/04/15 and was seen by Dr Radford Pax later that day. He was felt to be stable for discharge. His Mg++ was noted to be low at discharge and replacement ordered. He should have a BMP at follow up. He should be a TOC pt.    Discharge Vitals:  Blood pressure 105/54, pulse 63, temperature 97.8 F (36.6 C), temperature source Oral, resp. rate 20, height 5\' 8"  (1.727 m), weight 285 lb 1.6 oz (129.321 kg), SpO2 96 %.    Labs: Results for orders placed or  performed during the hospital encounter of 07/31/15 (from the past 24 hour(s))  Protime-INR     Status: Abnormal   Collection Time: 08/03/15  5:29 PM  Result Value Ref Range   Prothrombin Time 16.5 (H) 11.6 - 15.2 seconds   INR 1.32 0.00 - 1.49  Magnesium     Status: Abnormal   Collection Time: 08/03/15  5:29 PM  Result Value Ref Range   Magnesium 1.5 (L) 1.7 - 2.4 mg/dL    Disposition:      Follow-up Information    Follow up with Sharol Harness, MD.   Specialty:  Cardiology   Why:  office will contatct you   Contact information:   [address] [place] [address] [phone]       Discharge Medications:    Medication List    TAKE these medications        acetaminophen 325 MG tablet  Commonly known as:  TYLENOL  Take 2 tablets (650 mg total) by mouth every 4 (four) hours as needed for headache or mild pain.     amphetamine-dextroamphetamine 20 MG 24 hr capsule  Commonly known as:  ADDERALL XR  Take 20 mg by mouth daily as needed (ADD).     buPROPion 300 MG 24 hr tablet  Commonly known as:  WELLBUTRIN XL  Take 300 mg by mouth every morning.     cholecalciferol 1000 UNITS tablet  Commonly known as:  VITAMIN D  Take 1,000  Units by mouth daily.     CO Q 10 PO  Take 300 mg by mouth every evening.     dabigatran 150 MG Caps capsule  Commonly known as:  PRADAXA  Take 1 capsule (150 mg total) by mouth every 12 (twelve) hours.     diltiazem 240 MG 24 hr capsule  Commonly known as:  TIAZAC  Take 240 mg by mouth every morning.     escitalopram 10 MG tablet  Commonly known as:  LEXAPRO  Take 10 mg by mouth daily.     furosemide 40 MG tablet  Commonly known as:  LASIX  Take 1 tablet (40 mg total) by mouth 2 (two) times daily. And as directed     Magnesium Oxide 400 MG Caps  Take 1 capsule (400 mg total) by mouth daily. Take one capsule twice a day x 3 days, then once a day     metoprolol 50 MG tablet  Commonly known as:  LOPRESSOR  Take  25 mg by mouth 2 (two) times daily.     multivitamin with minerals Tabs tablet  Take 1 tablet by mouth daily.     phenazopyridine 200 MG tablet  Commonly known as:  PYRIDIUM  Take 1 tablet (200 mg total) by mouth 3 (three) times daily as needed for pain.     potassium chloride SA 20 MEQ tablet  Commonly known as:  K-DUR,KLOR-CON  Take 1 tablet (20 mEq total) by mouth daily.     tamsulosin 0.4 MG Caps capsule  Commonly known as:  FLOMAX  Take 1 capsule (0.4 mg total) by mouth daily.     valsartan 320 MG tablet  Commonly known as:  DIOVAN  Take 1 tablet (320 mg total) by mouth daily.     VITAMIN B-12 IJ  Inject 1 mL as directed every 30 (thirty) days.         Duration of Discharge Encounter: Greater than 30 minutes including physician time.  Angelena Form PA-C 08/04/2015 4:14 PM

## 2015-08-04 NOTE — Care Management Note (Signed)
Case Management Note Marvetta Gibbons RN, BSN Unit 2W-Case Manager [phone]  Patient Details  Name: Eric Mayer MRN: [id number] Date of Birth: 17-Mar-1941  Subjective/Objective:   Pt admitted with afib and CHF                 Action/Plan: PTA pt lived at home- anticipate return home- may benefit from [facility] for disease management- CM to follow  Expected Discharge Date:    08/04/15              Expected Discharge Plan:  Home/Self Care  In-House Referral:     Discharge planning Services  CM Consult  Post Acute Care Choice:    Choice offered to:     DME Arranged:    DME Agency:     HH Arranged:    [place] Agency:     Status of Service:  Completed, signed off  Medicare Important Message Given:    Date Medicare IM Given:    Medicare IM give by:    Date Additional Medicare IM Given:    Additional Medicare Important Message give by:     If discussed at [place] of Stay Meetings, dates discussed:    Discharge Disposition: Home/Self Care   Additional Comments:  08/04/15- pt s/p cardioversion today - for discharge today home with family  Dawayne Patricia, RN 08/04/2015, 4:29 PM

## 2015-08-04 NOTE — Transfer of Care (Signed)
Immediate Anesthesia Transfer of Care Note  Patient: Eric Mayer  Procedure(s) Performed: Procedure(s): CARDIOVERSION (N/A)  Patient Location: Endoscopy Unit  Anesthesia Type:MAC  Level of Consciousness: awake, alert , oriented and patient cooperative  Airway & Oxygen Therapy: Patient Spontanous Breathing  Post-op Assessment: Report given to RN and Post -op Vital signs reviewed and stable  Post vital signs: Reviewed and stable  Last Vitals:  Filed Vitals:   08/04/15 1007 08/04/15 1008  BP:  167/76  Pulse: 65 63  Temp:    Resp: 28 29    Complications: No apparent anesthesia complications

## 2015-08-04 NOTE — H&P (View-Only) (Signed)
SUBJECTIVE: Pt feeling much better. Says left leg swelling which has always been worse due to surgery for cancer has markedly improved. No chest pain or SOB.   Intake/Output Summary (Last 24 hours) at 08/03/15 1221 Last data filed at 08/03/15 1100  Gross per 24 hour  Intake   1200 ml  Output   2225 ml  Net  -1025 ml    Current Facility-Administered Medications  Medication Dose Route Frequency Provider Last Rate Last Dose  . 0.9 %  sodium chloride infusion  250 mL Intravenous PRN Rhonda G Barrett, PA-C      . acetaminophen (TYLENOL) tablet 650 mg  650 mg Oral Q4H PRN Rhonda G Barrett, PA-C      . ALPRAZolam (XANAX) tablet 0.25 mg  0.25 mg Oral BID PRN Evelene Croon Barrett, PA-C      . amphetamine-dextroamphetamine (ADDERALL XR) 24 hr capsule 20 mg  20 mg Oral Daily PRN Rhonda G Barrett, PA-C      . buPROPion (WELLBUTRIN XL) 24 hr tablet 300 mg  300 mg Oral q morning - 10a Rhonda G Barrett, PA-C   300 mg at 08/03/15 1134  . cholecalciferol (VITAMIN D) tablet 1,000 Units  1,000 Units Oral Daily Evelene Croon Barrett, PA-C   1,000 Units at 08/03/15 1134  . dabigatran (PRADAXA) capsule 150 mg  150 mg Oral Q12H Rhonda G Barrett, PA-C   150 mg at 08/03/15 1130  . diltiazem (CARDIZEM) 100 mg in dextrose 5 % 100 mL (1 mg/mL) infusion  5-15 mg/hr Intravenous Titrated Sueanne Margarita, MD   Stopped at 08/01/15 1441  . diltiazem (TIAZAC) 24 hr capsule 240 mg  240 mg Oral QHS [name], MD   240 mg at 08/02/15 2147  . escitalopram (LEXAPRO) tablet 10 mg  10 mg Oral Daily Evelene Croon Barrett, PA-C   10 mg at 08/03/15 1130  . furosemide (LASIX) injection 40 mg  40 mg Intravenous BID Rhonda G Barrett, PA-C   40 mg at 08/03/15 1134  . irbesartan (AVAPRO) tablet 150 mg  150 mg Oral Daily Troy Sine, MD   150 mg at 08/03/15 1134  . metoprolol tartrate (LOPRESSOR) tablet 25 mg  25 mg Oral BID Evelene Croon Barrett, PA-C   25 mg at 08/03/15 1134  . multivitamin with minerals tablet 1 tablet  1 tablet Oral Daily  Lonn Georgia, PA-C   1 tablet at 08/03/15 1202  . ondansetron (ZOFRAN) injection 4 mg  4 mg Intravenous Q6H PRN Rhonda G Barrett, PA-C      . potassium chloride SA (K-DUR,KLOR-CON) CR tablet 40 mEq  40 mEq Oral Daily Herminio Commons, MD   40 mEq at 08/03/15 1130  . sodium chloride 0.9 % injection 3 mL  3 mL Intravenous Q12H Rhonda G Barrett, PA-C   3 mL at 08/03/15 1144  . sodium chloride 0.9 % injection 3 mL  3 mL Intravenous PRN Rhonda G Barrett, PA-C      . tamsulosin (FLOMAX) capsule 0.4 mg  0.4 mg Oral Daily Rhonda G Barrett, PA-C   0.4 mg at 08/03/15 1130  . zolpidem (AMBIEN) tablet 5 mg  5 mg Oral QHS PRN Lonn Georgia, PA-C       Filed Vitals:   08/02/15 1436 08/02/15 2016 08/03/15 0402 08/03/15 1130  BP: 113/44 123/67 112/54 120/52  Pulse: 74 80 57 60  Temp: 97.7 F (36.5 C) 97.5 F (36.4 C) 97.8 F (36.6 C)   TempSrc: Oral Oral  Oral   Resp: 20 18 18    Height:      Weight:   286 lb 11.2 oz (130.046 kg)   SpO2: 96% 94% 93% 100%   PHYSICAL EXAM General: NAD HEENT: Normal. Neck: No JVD, no thyromegaly.  Lungs: Diminished, no rales or wheezes. CV: Irregular rhythm, normal S1/S2, no S3, no murmur. 2-3+ edema on left and 2+ on right lower extremity  Abdomen: Soft, nontender, obese.  Neurologic: Alert and oriented x 3.  Psych: Normal affect. Musculoskeletal: No gross deformities. Extremities: No clubbing or cyanosis.   TELEMETRY: Reviewed telemetry pt in rate-controlled atrial fibrillation.  LABS: Basic Metabolic Panel:  Recent Labs  07/31/15 1820  08/02/15 0425 08/03/15 0210  NA 148*  < > 142 140  K 4.4  < > 3.2* 3.5  CL 109  < > 102 98*  CO2 32  < > 32 33*  GLUCOSE 102*  < > 100* 98  BUN 14  < > 14 15  CREATININE 0.82  < > 0.85 0.91  CALCIUM 9.1  < > 8.7* 8.8*  MG 1.5*  --   --   --   < > = values in this interval not displayed.  Liver Function Tests:  Recent Labs  07/31/15 1820  AST 18  ALT 26  ALKPHOS 88  BILITOT 0.5  PROT 5.8*    ALBUMIN 3.3*    Recent Labs  07/31/15 1820  WBC 5.9  NEUTROABS 4.0  HGB 11.3*  HCT 36.4*  MCV 96.6  PLT 183   Cardiac Enzymes:  Recent Labs  07/31/15 1820 07/31/15 2322 08/01/15 0425  TROPONINI <0.03 <0.03 <0.03    Recent Labs  07/31/15 1820  TSH 2.760   RADIOLOGY: Dg Chest 2 View  08/01/2015  CLINICAL DATA:  Shortness of breath and bilateral lower extremity edema, history of CHF, atrial fibrillation, and pericardial effusion. EXAM: CHEST  2 VIEW COMPARISON:  PA and lateral chest x-ray of September 17, 2014 FINDINGS: The lungs are adequately inflated. The interstitial markings remain increased bilaterally but are not greatly changed. The cardiac silhouette is enlarged. There is a new small left pleural effusion. The bony thorax is unremarkable. IMPRESSION: CHF with mild pulmonary interstitial edema and new small left pleural effusion. Electronically Signed   By: David  Martinique M.D.   On: 08/01/2015 08:01     ASSESSMENT AND PLAN:  1. Atrial fibrillation: When seen on 12/15 in the office with Dr. Oval Linsey, ECG suggested atrial flutter. Yesterday telemetry showed definite atrial fibrillation with a ventricular rate that has steadily improved, now 50-70 bpm range compared to ~ 130 bpm. I suspect that his poorly-controlled atrial fibrillation/flutter is what is causing his worsening heart failure.By clinical history, it is suggestive that he may have been in an abnormal rhythm for 2-4 weeks. He will need to be cardioverted to restore sinus rhythm. We will plan for a DCCV in the am, no TEE needed as he was compliant with Pradaxa. He may need an antiarrhythmic to maintain sinus rhythm id he returns back to a-fib.  2. Acute on chronic diastolic heart failure: Excellent diuresis since admission; approximately 8.5 L; continue metorprolol, diltiazem.Continue ARB 150 mg as BP controlled.He is still fluid overloaded, continue diuresis with IV lasix. -Hypokalemic, will supplement  daily  This patients CHA2DS2-VASc Score and unadjusted Ischemic Stroke Rate (% per year) is equal to 2.2 % stroke rate/year from a score of 2  Above score calculated as 1 point each if present [CHF, HTN, DM, Vascular=MI/PAD/Aortic  Plaque, Age if 11-74, or Male] Above score calculated as 2 points each if present [Age > 75, or Stroke/TIA/TE]  3.Hypertension: Controlled. - Continue ARB, metoprolol and diltiazem - Consider switching to carvedilol if his BP is elevated in the hospital  4. OSA on CPAP at home: CPAP.  5. Peripheral edema: He continues to have 3+ lower extremity edema on the left and 2+ on the right. Will continue with IV diuresis; consider support stockings.  Dorothy Spark 08/03/2015

## 2015-08-04 NOTE — Anesthesia Preprocedure Evaluation (Addendum)
Anesthesia Evaluation  Patient identified by MRN, date of birth, ID band Patient awake    Reviewed: Allergy & Precautions, NPO status , Patient's Chart, lab work & pertinent test results  History of Anesthesia Complications Negative for: history of anesthetic complications  Airway Mallampati: I  TM Distance: >3 FB Neck ROM: Full  Mouth opening: Limited Mouth Opening  Dental  (+) Teeth Intact, Dental Advisory Given   Pulmonary sleep apnea and Continuous Positive Airway Pressure Ventilation ,    breath sounds clear to auscultation       Cardiovascular hypertension, Pt. on medications and Pt. on home beta blockers + dysrhythmias Atrial Fibrillation  Rhythm:Regular Rate:Normal     Neuro/Psych    GI/Hepatic negative GI ROS, Neg liver ROS,   Endo/Other    Renal/GU negative Renal ROS     Musculoskeletal  (+) Arthritis ,   Abdominal   Peds  Hematology   Anesthesia Other Findings   Reproductive/Obstetrics                          Anesthesia Physical Anesthesia Plan  ASA: III  Anesthesia Plan: General   Post-op Pain Management:    Induction: Intravenous  Airway Management Planned: Mask  Additional Equipment:   Intra-op Plan:   Post-operative Plan:   Informed Consent: I have reviewed the patients History and Physical, chart, labs and discussed the procedure including the risks, benefits and alternatives for the proposed anesthesia with the patient or authorized representative who has indicated his/her understanding and acceptance.   Dental advisory given  Plan Discussed with: CRNA and Anesthesiologist  Anesthesia Plan Comments:         Anesthesia Quick Evaluation

## 2015-08-04 NOTE — Interval H&P Note (Signed)
History and Physical Interval Note:  08/04/2015 9:08 AM  Eric Mayer  has presented today for surgery, with the diagnosis of Atrial Fibrillation  The various methods of treatment have been discussed with the patient and family. After consideration of risks, benefits and other options for treatment, the patient has consented to  Procedure(s): CARDIOVERSION (N/A) as a surgical intervention .  The patient's history has been reviewed, patient examined, no change in status, stable for surgery.  I have reviewed the patient's chart and labs.  Questions were answered to the patient's satisfaction.     Sharol Harness, MD

## 2015-08-04 NOTE — Discharge Instructions (Addendum)
Information on my medicine - Pradaxa (dabigatran)  This medication education was reviewed with me or my healthcare representative as part of my discharge preparation.  The pharmacist that spoke with me during my hospital stay was:  Romona Curls, [facility]  Why was Pradaxa prescribed for you? Pradaxa was prescribed for you to reduce the risk of forming blood clots that cause a stroke if you have a medical condition called atrial fibrillation (a type of irregular heartbeat).    What do you Need to know about PradAXa? Take your Pradaxa TWICE DAILY - one capsule in the morning and one tablet in the evening with or without food.  It would be best to take the doses about the same time each day.  The capsules should not be broken, chewed or opened - they must be swallowed whole.  Do not store Pradaxa in other medication containers - once the bottle is opened the Pradaxa should be used within FOUR months; throw away any capsules that havent been by that time.  Take Pradaxa exactly as prescribed by your doctor.  DO NOT stop taking Pradaxa without talking to the doctor who prescribed the medication.  Stopping without other stroke prevention medication to take the place of Pradaxa may increase your risk of developing a clot that causes a stroke.  Refill your prescription before you run out.  After discharge, you should have regular check-up appointments with your healthcare provider that is prescribing your Pradaxa.  In the future your dose may need to be changed if your kidney function or weight changes by a significant amount.  What do you do if you miss a dose? If you miss a dose, take it as soon as you remember on the same day.  If your next dose is less than 6 hours away, skip the missed dose.  Do not take two doses of PRADAXA at the same time.  Important Safety Information A possible side effect of Pradaxa is bleeding. You should call your healthcare provider right away if you experience any  of the following: ? Bleeding from an injury or your nose that does not stop. ? Unusual colored urine (red or dark brown) or unusual colored stools (red or black). ? Unusual bruising for unknown reasons. ? A serious fall or if you hit your head (even if there is no bleeding).  Some medicines may interact with Pradaxa and might increase your risk of bleeding or clotting while on Pradaxa. To help avoid this, consult your healthcare provider or pharmacist prior to using any new prescription or non-prescription medications, including herbals, vitamins, non-steroidal anti-inflammatory drugs (NSAIDs) and supplements.  This website has more information on Pradaxa (dabigatran): https://www.pradaxa.com    Atrial Fibrillation Atrial fibrillation is a type of heartbeat that is irregular or fast (rapid). If you have this condition, your heart keeps quivering in a weird (chaotic) way. This condition can make it so your heart cannot pump blood normally. Having this condition gives a person more risk for stroke, heart failure, and other heart problems. There are different types of atrial fibrillation. Talk with your doctor to learn about the type that you have. HOME CARE  Take over-the-counter and prescription medicines only as told by your doctor.  If your doctor prescribed a blood-thinning medicine, take it exactly as told. Taking too much of it can cause bleeding. If you do not take enough of it, you will not have the protection that you need against stroke and other problems.  Do not use  any tobacco products. These include cigarettes, chewing tobacco, and e-cigarettes. If you need help quitting, ask your doctor.  If you have apnea (obstructive sleep apnea), manage it as told by your doctor.  Do not drink alcohol.  Do not drink beverages that have caffeine. These include coffee, soda, and tea.  Maintain a healthy weight. Do not use diet pills unless your doctor says they are safe for you. Diet  pills may make heart problems worse.  Follow diet instructions as told by your doctor.  Exercise regularly as told by your doctor.  Keep all follow-up visits as told by your doctor. This is important. GET HELP IF:  You notice a change in the speed, rhythm, or strength of your heartbeat.  You are taking a blood-thinning medicine and you notice more bruising.  You get tired more easily when you move or exercise. GET HELP RIGHT AWAY IF:  You have pain in your chest or your belly (abdomen).  You have sweating or weakness.  You feel sick to your stomach (nauseous).  You notice blood in your throw up (vomit), poop (stool), or pee (urine).  You are short of breath.  You suddenly have swollen feet and ankles.  You feel dizzy.  Your suddenly get weak or numb in your face, arms, or legs, especially if it happens on one side of your body.  You have trouble talking, trouble understanding, or both.  Your face or your eyelid droops on one side. These symptoms may be an emergency. Do not wait to see if the symptoms will go away. Get medical help right away. Call your local emergency services (911 in the [place] Do not drive yourself to the hospital.   This information is not intended to replace advice given to you by your health care provider. Make sure you discuss any questions you have with your health care provider.   Document Released: 05/11/2008 Document Revised: 04/23/2015 Document Reviewed: 11/27/2014 Elsevier Interactive Patient Education [employer].

## 2015-08-04 NOTE — Progress Notes (Addendum)
SUBJECTIVE:  No complaints  OBJECTIVE:   Vitals:   Filed Vitals:   08/04/15 1010 08/04/15 1015 08/04/15 1025 08/04/15 1342  BP: 172/75 176/83 177/78 105/54  Pulse: 84 67 65 63  Temp:    97.8 F (36.6 C)  TempSrc:    Oral  Resp: 29 29 23 20   Height:      Weight:      SpO2: 85% 98% 94% 96%   I&O's:   Intake/Output Summary (Last 24 hours) at 08/04/15 1526 Last data filed at 08/04/15 1341  Gross per 24 hour  Intake    630 ml  Output   2500 ml  Net  -1870 ml   TELEMETRY: Reviewed telemetry pt in NSR:     PHYSICAL EXAM General: Well developed, well nourished, in no acute distress Head: Eyes PERRLA, No xanthomas.   Normal cephalic and atramatic  Lungs:   Clear bilaterally to auscultation and percussion. Heart:   HRRR S1 S2 Pulses are 2+ & equal.            No carotid bruit. No JVD.  No abdominal bruits. No femoral bruits. Abdomen: Bowel sounds are positive, abdomen soft and non-tender without masses or                  Hernia's noted. Msk:  Back normal, normal gait. Normal strength and tone for age. Extremities:   No clubbing, cyanosis or edema.  DP +1 Neuro: Alert and oriented X 3. Psych:  Good affect, responds appropriately   LABS: Basic Metabolic Panel:  Recent Labs  08/02/15 0425 08/03/15 0210 08/03/15 1729  NA 142 140  --   K 3.2* 3.5  --   CL 102 98*  --   CO2 32 33*  --   GLUCOSE 100* 98  --   BUN 14 15  --   CREATININE 0.85 0.91  --   CALCIUM 8.7* 8.8*  --   MG  --   --  1.5*   Liver Function Tests: No results for input(s): AST, ALT, ALKPHOS, BILITOT, PROT, ALBUMIN in the last 72 hours. No results for input(s): LIPASE, AMYLASE in the last 72 hours. CBC: No results for input(s): WBC, NEUTROABS, HGB, HCT, MCV, PLT in the last 72 hours. Cardiac Enzymes: No results for input(s): CKTOTAL, CKMB, CKMBINDEX, TROPONINI in the last 72 hours. BNP: Invalid input(s): POCBNP D-Dimer: No results for input(s): DDIMER in the last 72 hours. Hemoglobin  A1C: No results for input(s): HGBA1C in the last 72 hours. Fasting Lipid Panel: No results for input(s): CHOL, HDL, LDLCALC, TRIG, CHOLHDL, LDLDIRECT in the last 72 hours. Thyroid Function Tests: No results for input(s): TSH, T4TOTAL, T3FREE, THYROIDAB in the last 72 hours.  Invalid input(s): FREET3 Anemia Panel: No results for input(s): VITAMINB12, FOLATE, FERRITIN, TIBC, IRON, RETICCTPCT in the last 72 hours. Coag Panel:   Lab Results  Component Value Date   INR 1.32 08/03/2015   INR 1.22 07/31/2015   INR 1.15 12/05/2014    RADIOLOGY: Dg Chest 2 View  08/01/2015  CLINICAL DATA:  Shortness of breath and bilateral lower extremity edema, history of CHF, atrial fibrillation, and pericardial effusion. EXAM: CHEST  2 VIEW COMPARISON:  PA and lateral chest x-ray of September 17, 2014 FINDINGS: The lungs are adequately inflated. The interstitial markings remain increased bilaterally but are not greatly changed. The cardiac silhouette is enlarged. There is a new small left pleural effusion. The bony thorax is unremarkable. IMPRESSION: CHF with mild pulmonary interstitial edema  and new small left pleural effusion. Electronically Signed   By: David  Martinique M.D.   On: 08/01/2015 08:01   ASSESSMENT AND PLAN:  1. Atrial fibrillation: When seen on 12/15 in the office with Dr. Oval Linsey, ECG suggested atrial flutter. In [facility] telemetry showed definite atrial fibrillation with a ventricular rate that has steadily improved, now 50-70 bpm range compared to ~ 130 bpm. I suspect that his poorly-controlled atrial fibrillation/flutter is what is causing his worsening heart failure.By clinical history, it is suggestive that he may have been in an abnormal rhythm for 2-4 weeks. He will need to be cardioverted to restore sinus rhythm. DCCV to NSR performed this am, no TEE needed as he was compliant with Pradaxa. He may need an antiarrhythmic to maintain sinus rhythm id he returns back to a-fib.  2. Acute on  chronic diastolic heart failure: Excellent diuresis since admission; approximately 12 L; continue metorprolol, diltiazem.Increase  ARB 300 mg as BP has been poorly controlled.  He does not appear volume overloaded today and he wants to go home.  Hypokalemic, will supplement daily  This patients CHA2DS2-VASc Score and unadjusted Ischemic Stroke Rate (% per year) is equal to 2.2 % stroke rate/year from a score of 2  Above score calculated as 1 point each if present [CHF, HTN, DM, Vascular=MI/PAD/Aortic Plaque, Age if 65-74, or Male] Above score calculated as 2 points each if present [Age > 75, or Stroke/TIA/TE]  3.Hypertension: Poorly controlled. - Increase ARB and continue metoprolol and diltiazem  4. OSA on CPAP at home: CPAP.  5. Peripheral edema: edema improved.  Change to PO Lasix and consider support stockings.    Sueanne Margarita, MD  08/04/2015  3:26 PM

## 2015-08-04 NOTE — Progress Notes (Signed)
Utilization review completed.  

## 2015-08-04 NOTE — Anesthesia Postprocedure Evaluation (Signed)
Anesthesia Post Note  Patient: Eric Mayer  Procedure(s) Performed: Procedure(s) (LRB): CARDIOVERSION (N/A)  Patient location during evaluation: Endoscopy Anesthesia Type: General Level of consciousness: awake Vital Signs Assessment: post-procedure vital signs reviewed and stable Respiratory status: spontaneous breathing Cardiovascular status: stable Anesthetic complications: no    Last Vitals:  Filed Vitals:   08/04/15 1015 08/04/15 1025  BP: 176/83 177/78  Pulse: 67 65  Temp:    Resp: 29 23    Last Pain: There were no vitals filed for this visit.               EDWARDS,Mathew Storck

## 2015-08-04 NOTE — Progress Notes (Addendum)
Nursing note Pt given discharge instructions medication list, follow up appointment and medications sent to personal pharmacy. Patient verbalized understanding patient SR on monitor prior to discharge,  will dicharge home as ordered. Eleana Tocco, Bettina Gavia RN

## 2015-08-05 ENCOUNTER — Encounter (HOSPITAL_COMMUNITY): Payer: Self-pay | Admitting: Cardiovascular Disease

## 2015-08-05 ENCOUNTER — Telehealth: Payer: Self-pay | Admitting: Cardiology

## 2015-08-05 NOTE — Telephone Encounter (Signed)
TOC phone call .[name] Appt is on 08/19/15 at 10:30am w/ [name] at [facility] office   Thanks

## 2015-08-05 NOTE — Telephone Encounter (Signed)
Patient contacted regarding discharge from [facility] on 08/04/15.  Patient understands to follow up with provider [name] on 08/19/15 at 10:30am at [facility]. Patient understands discharge instructions? yes Patient understands medications and regiment? yes Patient understands to bring all medications to this visit? yes  He did experience a little discomfort under his diaphragm last pm after taking potassium and magnesium, several bites of burrito and a pepsi.  No problems since.  States he will take the potassium and magnesium in the am's and will let us know if he has anymore discomfort.

## 2015-08-06 ENCOUNTER — Telehealth: Payer: Self-pay | Admitting: Cardiology

## 2015-08-06 ENCOUNTER — Telehealth: Payer: Self-pay | Admitting: Physician Assistant

## 2015-08-06 DIAGNOSIS — I480 Paroxysmal atrial fibrillation: Secondary | ICD-10-CM

## 2015-08-06 DIAGNOSIS — I5033 Acute on chronic diastolic (congestive) heart failure: Secondary | ICD-10-CM

## 2015-08-06 NOTE — Telephone Encounter (Signed)
Scheduled appointment for BMET and Mag level tomorrow. Patient grateful for call.

## 2015-08-06 NOTE — Addendum Note (Signed)
Addended by: Harland German A on: 08/06/2015 04:08 PM   Modules accepted: Orders

## 2015-08-06 NOTE — Telephone Encounter (Signed)
Received records from [facility] for appointment on 08/19/15 with [name], PA.  Records given to [employer] (medical records) for Eric Mayer's schedule on 08/19/15. lp

## 2015-08-06 NOTE — Telephone Encounter (Signed)
Please have patient come in for BMET and Mg level tomorrow

## 2015-08-07 ENCOUNTER — Other Ambulatory Visit (INDEPENDENT_AMBULATORY_CARE_PROVIDER_SITE_OTHER): Payer: Medicare Other

## 2015-08-07 DIAGNOSIS — I480 Paroxysmal atrial fibrillation: Secondary | ICD-10-CM

## 2015-08-07 DIAGNOSIS — I5033 Acute on chronic diastolic (congestive) heart failure: Secondary | ICD-10-CM

## 2015-08-07 NOTE — Addendum Note (Signed)
Addended by: Eulis Foster on: 08/07/2015 08:29 AM   Modules accepted: Orders

## 2015-08-08 LAB — BASIC METABOLIC PANEL
BUN: 15 mg/dL (ref 7–25)
CO2: 26 mmol/L (ref 20–31)
Calcium: 9.3 mg/dL (ref 8.6–10.3)
Chloride: 109 mmol/L (ref 98–110)
Creat: 0.74 mg/dL (ref 0.70–1.18)
Glucose, Bld: 135 mg/dL — ABNORMAL HIGH (ref 65–99)
Potassium: 4.5 mmol/L (ref 3.5–5.3)
Sodium: 146 mmol/L (ref 135–146)

## 2015-08-14 ENCOUNTER — Telehealth: Payer: Self-pay | Admitting: *Deleted

## 2015-08-14 NOTE — Telephone Encounter (Signed)
-----   Message from [name], MD sent at 08/13/2015  7:57 AM EST ----- Normal kidney function and electrolytes.

## 2015-08-14 NOTE — Telephone Encounter (Signed)
Spoke to patient. Labs Result given. Verbalized understanding

## 2015-08-19 ENCOUNTER — Ambulatory Visit (INDEPENDENT_AMBULATORY_CARE_PROVIDER_SITE_OTHER): Payer: Medicare Other | Admitting: Physician Assistant

## 2015-08-19 ENCOUNTER — Encounter: Payer: Self-pay | Admitting: Physician Assistant

## 2015-08-19 VITALS — BP 116/66 | HR 68 | Ht 69.5 in | Wt 292.4 lb

## 2015-08-19 DIAGNOSIS — Z79899 Other long term (current) drug therapy: Secondary | ICD-10-CM | POA: Diagnosis not present

## 2015-08-19 DIAGNOSIS — I480 Paroxysmal atrial fibrillation: Secondary | ICD-10-CM

## 2015-08-19 DIAGNOSIS — I5033 Acute on chronic diastolic (congestive) heart failure: Secondary | ICD-10-CM

## 2015-08-19 NOTE — Progress Notes (Signed)
Cardiology Office Note   Date:  08/19/2015   ID:  Eric Mayer, DOB 06-May-1941, MRN [id number]  PCP:  Irven Shelling, MD  Cardiologist:  Dr Oliver Barre, PA-C   Chief Complaint  Patient presents with  . Hospitalization Follow-up  . Atrial Fibrillation    History of Present Illness: Eric Mayer is a 75 y.o. male with a history of D-CHF, OSA w/ CPAP, PAF, HTN, adeno CA prostate w/ XRT, DBA echo neg for ischemia, EF nl. D/c 12/19 after CHF admit. During that admission, he was in atrial flutter and was cardioverted to sinus rhythm.   Eric Mayer presents for post hospital follow-up.  Since discharge from the hospital, he has not really weighed himself. He states it is because he was out of town. He has significant dietary indiscretions in that he is not watching the sodium in his diet, eats things such as potato chips and prepared food and drinks Gatorade. He admits that he has not been compliant with a low-sodium diet. He states that he and his wife have agreed to make dietary changes. He is still over 20 pounds below his admission weight.  He has no palpitations, no lightheaded feelings or dizziness. He has a right knee replacement and is still working to increase his activity. He walks with a cane.   Past Medical History  Diagnosis Date  . Hypertension   . History of radiation therapy 1993    sarcoma of left groin, tx at [facility]  . PAF (paroxysmal atrial fibrillation) ([place])     prior CARDIOLOGIST-- DR Tressia Miners TURNER/   now monitored by dr Jenny Reichmann griffin  . Incomplete right bundle branch block   . History of sarcoma     1993  LEFT GOIN--  S/P SURGERY, RADIATION AND CHEMO IN [place]  . Hypogonadism in male   . History of peptic ulcer     1980's  . History of gastric bypass   . 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 Weakness of left leg     SECONDARY TO  SURGICAL NERVE INJURY OF LEFT GOIN  . Nocturia   . Wears glasses   . Depression   . Atrial flutter ([place]) 07/31/2015  . Chronic diastolic heart failure ([place])     Archie Endo 07/31/2015  . OSA on CPAP   . Anemia 1980s X 1  . Arthritis     "hands, knees, hips, ankles" (07/31/2015)  . 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 Sarcoma ([place]) ~ 1993/1994    "of groin"    Past Surgical History  Procedure Laterality Date  . Intestinal bypass  1976    GASTRIC FOR OBESITY  . Patella fracture surgery Left ~ 1995    "broke it twice; only had OR once"  . Prostate biopsy  06/2014  . Ventral hernia repair  1977  . Groin dissection Left 1992   [place]    SARCOMA SURGERY  . Colonoscopy  07-03-2002  . Transthoracic echocardiogram  08-08-2003    moderate LVH/  ef 55-65%/  mild MR/  moderate LAE/  trivial TR/  trivial pericardial effusion posterior to the heart  . Forearm fracture surgery  1986  . Radioactive seed implant N/A 10/17/2014    Procedure: RADIOACTIVE SEED IMPLANT    ;  Surgeon: Shane Crutch  Gaynelle Arabian, MD;  Location: [facility];  Service: Urology;  Laterality: N/A;   70 SEEDS IMPLANTED   . Cardiac catheterization  08-12-2003  dr Tressia Miners turner    Normal coronary arteries, normal wall motion, no sig. abnormalities  . Total knee arthroplasty Right 12/17/2014    Procedure: TOTAL KNEE ARTHROPLASTY;  Surgeon: Melrose Nakayama, MD;  Location: [place];  Service: Orthopedics;  Laterality: Right;  . Fracture surgery    . Tonsillectomy  1950s  . Inguinal hernia repair Right 1950s?  . Joint replacement    . Cardioversion N/A 08/04/2015    Procedure: CARDIOVERSION;  Surgeon: [name], MD;  Location: [place];  Service: Cardiovascular;  Laterality: N/A;    Current Outpatient Prescriptions  Medication Sig Dispense Refill  . acetaminophen (TYLENOL) 325 MG tablet Take 2 tablets (650 mg total) by mouth every 4 (four) hours as needed for headache  or mild pain.    [name] amphetamine-dextroamphetamine (ADDERALL XR) 20 MG 24 hr capsule Take 20 mg by mouth daily as needed (ADD).    [name] buPROPion (WELLBUTRIN XL) 300 MG 24 hr tablet Take 300 mg by mouth every morning.    . cholecalciferol (VITAMIN D) 1000 UNITS tablet Take 1,000 Units by mouth daily.    . Coenzyme Q10 (CO Q 10 PO) Take 300 mg by mouth every evening.     . Cyanocobalamin (VITAMIN B-12 IJ) Inject 1 mL as directed every 30 (thirty) days.     . dabigatran (PRADAXA) 150 MG CAPS capsule Take 1 capsule (150 mg total) by mouth every 12 (twelve) hours. 60 capsule 0  . diltiazem (TIAZAC) 240 MG 24 hr capsule Take 240 mg by mouth every morning.     . escitalopram (LEXAPRO) 10 MG tablet Take 10 mg by mouth daily.    . furosemide (LASIX) 40 MG tablet Take 1 tablet (40 mg total) by mouth 2 (two) times daily. And as directed 180 tablet 3  . Magnesium Oxide 400 MG CAPS Take 1 capsule (400 mg total) by mouth daily. Take one capsule twice a day x 3 days, then once a day 35 capsule 5  . metoprolol (LOPRESSOR) 50 MG tablet Take 25 mg by mouth 2 (two) times daily.    . Multiple Vitamin (MULTIVITAMIN WITH MINERALS) TABS tablet Take 1 tablet by mouth daily.    . phenazopyridine (PYRIDIUM) 200 MG tablet Take 1 tablet (200 mg total) by mouth 3 (three) times daily as needed for pain. 30 tablet 2  . potassium chloride SA (K-DUR,KLOR-CON) 20 MEQ tablet Take 1 tablet (20 mEq total) by mouth daily. 30 tablet 11  . tamsulosin (FLOMAX) 0.4 MG CAPS capsule Take 1 capsule (0.4 mg total) by mouth daily. 30 capsule 1  . valsartan (DIOVAN) 320 MG tablet Take 1 tablet (320 mg total) by mouth daily. 90 tablet 3   No current facility-administered medications for this visit.    Allergies:   Ace inhibitors    Social History:  The patient  reports that he has never smoked. He has never used smokeless tobacco. He reports that he does not drink alcohol or use illicit drugs.   Family History:  The patient's family history  includes Atrial fibrillation in his father; Cancer in his sister; Liver disease in his mother; Renal Disease in his mother; Stroke in his mother.    ROS:  Please see the history of present illness. All other systems are reviewed and negative.    PHYSICAL EXAM: VS:  BP 116/66 mmHg  Pulse 68  Ht 5' 9.5" (1.765 m)  Wt 292 lb 6.4 oz (132.632 kg)  BMI 42.58 kg/m2 , BMI Body mass index is 42.58 kg/(m^2). GEN: Well nourished, well developed, male in no acute distress HEENT: normal for age  Neck: JVD at 8 cm with strongly positive hepatojugular reflux, no carotid bruit, no masses Cardiac: Irregular rate and rhythm; no murmur, no rubs, or gallops Respiratory:  clear to auscultation bilaterally, normal work of breathing GI: soft, nontender, nondistended, + BS MS: no deformity or atrophy; 1-2 + edema; distal pulses are 2+ in all 4 extremities  Skin: warm and dry, no rash Neuro:  Strength and sensation are intact Psych: euthymic mood, full affect   EKG:  EKG is ordered today. The ekg ordered today demonstrates atrial fibrillation, controlled ventricular rate.   Recent Labs: 07/31/2015: ALT 26; Hemoglobin 11.3*; Platelets 183; TSH 2.760 08/02/2015: B Natriuretic Peptide 103.2* 08/03/2015: Magnesium 1.5* 08/07/2015: BUN 15; Creat 0.74; Potassium 4.5; Sodium 146    Lipid Panel No results found for: CHOL, TRIG, HDL, CHOLHDL, VLDL, LDLCALC, LDLDIRECT   Wt Readings from Last 3 Encounters:  08/19/15 292 lb 6.4 oz (132.632 kg)  08/04/15 285 lb 1.6 oz (129.321 kg)  07/31/15 311 lb 6.4 oz (141.25 kg)     Other studies Reviewed: Additional studies/ records that were reviewed today include: Hospital records, ECGs.  ASSESSMENT AND PLAN:  1.  Acute on chronic diastolic CHF, class II: Eric Mayer was educated on 2000 mg sodium and 2 L fluid restrictions. Once this was discussed with him, he agreed that he would just have to discontinue Gatorade. He also realizes he will have to cut out the junk  food that he eats and [place] attention to the sodium and foods.  To pull some fluid off, we will increase his Lasix and his potassium chloride 2 twice a day other current doses for 4 days. He needs a BMET next week. Follow-up in one month, or sooner when necessary.  2. Atrial fibrillation: He was in atrial flutter in the hospital and cardioverted. He is now back in atrial fibrillation. He has no awareness of the arrhythmia. His rate is controlled. He is anticoagulated. His left atrium was moderately dilated at echocardiogram in November. Continue Cardizem and metoprolol at current doses, discuss rate control versus rhythm control strategy with Dr. Oval Linsey.  3. Chronic anticoagulation, CHADS2VASC=3: He is tolerating the Pradaxa well but his insurance is changing and he will require prior authorization. We will obtain this.   Current medicines are reviewed at length with the patient today.  The patient has concerns regarding medicines.  The following changes have been made:  Temporarily increase Lasix and potassium, obtain prior authorization for Pradaxa  Labs/ tests ordered today include:   Orders Placed This Encounter  Procedures  . Basic metabolic panel  . EKG 12-Lead     Disposition:   FU with Dr. Oval Linsey  Signed, [name], PA-C  08/19/2015 1:09 PM    [address], [place],   [address] Phone: [phone]; Fax: [phone]

## 2015-08-19 NOTE — Patient Instructions (Addendum)
Your physician has recommended you make the following change in your medication:   INCREASE FUROSEMIDE AND POTASSIUM .TO TWICE DAILY X 4 DAYS.  Your physician recommends that you return for lab work in: [place] 2 LITER OF LIQUID PER DAY.  FILL UP A 2 LITER  BOTTLE WITH WATER EACH DAY.  WHEN YOU DRINK ANYTHING POUR THE EQUIVALENT OF THIS OUT OF THE 2 LITER BOTTLE OF WATER.  WHEN THAT BOTTLE IS EMPTY YOU SHOULD DRINK NO MORE LIQUIDS FOR THE DAY.  Your physician recommends that you schedule a follow-up appointment in: [address] [place].  A PRIOR AUTHORIZATION WILL BE DONE FOR PRADAXA THROUGH THE [address] OFFICE.  Low-Sodium Eating Plan Sodium raises blood pressure and causes water to be held in the body. Getting less sodium from food will help lower your blood pressure, reduce any swelling, and protect your heart, liver, and kidneys. We get sodium by adding salt (sodium chloride) to food. Most of our sodium comes from canned, boxed, and frozen foods. Restaurant foods, fast foods, and pizza are also very high in sodium. Even if you take medicine to lower your blood pressure or to reduce fluid in your body, getting less sodium from your food is important. WHAT IS MY PLAN? Most people should limit their sodium intake to 2,300 mg a day. Your health care provider recommends that you limit your sodium intake to __________ a day.  WHAT DO I NEED TO KNOW ABOUT THIS EATING PLAN? For the low-sodium eating plan, you will follow these general guidelines:  Choose foods with a % Daily Value for sodium of less than 5% (as listed on the food label).   Use salt-free seasonings or herbs instead of table salt or sea salt.   Check with your health care provider or pharmacist before using salt substitutes.   Eat fresh foods.  Eat more vegetables and fruits.  Limit canned vegetables. If you do use them, rinse them well to decrease the sodium.    Limit cheese to 1 oz (28 g) per day.   Eat lower-sodium products, often labeled as "lower sodium" or "no salt added."  Avoid foods that contain monosodium glutamate (MSG). MSG is sometimes added to [place] food and some canned foods.  Check food labels (Nutrition Facts labels) on foods to learn how much sodium is in one serving.  Eat more home-cooked food and less restaurant, buffet, and fast food.  When eating at a restaurant, ask that your food be prepared with less salt, or no salt if possible.  HOW DO I READ FOOD LABELS FOR SODIUM INFORMATION? The Nutrition Facts label lists the amount of sodium in one serving of the food. If you eat more than one serving, you must multiply the listed amount of sodium by the number of servings. Food labels may also identify foods as:  Sodium free--Less than 5 mg in a serving.  Very low sodium--35 mg or less in a serving.  Low sodium--140 mg or less in a serving.  Light in sodium--50% less sodium in a serving. For example, if a food that usually has 300 mg of sodium is changed to become light in sodium, it will have 150 mg of sodium.  Reduced sodium--25% less sodium in a serving. For example, if a food that usually has 400 mg of sodium is changed to reduced sodium, it will have 300 mg of sodium. WHAT FOODS CAN I EAT? Grains Low-sodium cereals,  including oats, puffed wheat and rice, and shredded wheat cereals. Low-sodium crackers. Unsalted rice and pasta. Lower-sodium bread.  Vegetables Frozen or fresh vegetables. Low-sodium or reduced-sodium canned vegetables. Low-sodium or reduced-sodium tomato sauce and paste. Low-sodium or reduced-sodium tomato and vegetable juices.  Fruits Fresh, frozen, and canned fruit. Fruit juice.  Meat and Other Protein Products Low-sodium canned tuna and salmon. Fresh or frozen meat, poultry, seafood, and fish. Lamb. Unsalted nuts. Dried beans, peas, and lentils without added salt. Unsalted canned beans.  Homemade soups without salt. Eggs.  Dairy Milk. Soy milk. Ricotta cheese. Low-sodium or reduced-sodium cheeses. Yogurt.  Condiments Fresh and dried herbs and spices. Salt-free seasonings. Onion and garlic powders. Low-sodium varieties of mustard and ketchup. Fresh or refrigerated horseradish. Lemon juice.  Fats and Oils Reduced-sodium salad dressings. Unsalted butter.  Other Unsalted popcorn and pretzels.  The items listed above may not be a complete list of recommended foods or beverages. Contact your dietitian for more options. WHAT FOODS ARE NOT RECOMMENDED? Grains Instant hot cereals. Bread stuffing, pancake, and biscuit mixes. Croutons. Seasoned rice or pasta mixes. Noodle soup cups. Boxed or frozen macaroni and cheese. Self-rising flour. Regular salted crackers. Vegetables Regular canned vegetables. Regular canned tomato sauce and paste. Regular tomato and vegetable juices. Frozen vegetables in sauces. Salted [place] fries. Olives. Angie Fava. Relishes. Sauerkraut. Salsa. Meat and Other Protein Products Salted, canned, smoked, spiced, or pickled meats, seafood, or fish. Bacon, ham, sausage, hot dogs, corned beef, chipped beef, and packaged luncheon meats. Salt pork. Jerky. Pickled herring. Anchovies, regular canned tuna, and sardines. Salted nuts. Dairy Processed cheese and cheese spreads. Cheese curds. Blue cheese and cottage cheese. Buttermilk.  Condiments Onion and garlic salt, seasoned salt, table salt, and sea salt. Canned and packaged gravies. Worcestershire sauce. Tartar sauce. Barbecue sauce. Teriyaki sauce. Soy sauce, including reduced sodium. Steak sauce. Fish sauce. Oyster sauce. Cocktail sauce. Horseradish that you find on the shelf. Regular ketchup and mustard. Meat flavorings and tenderizers. Bouillon cubes. Hot sauce. Tabasco sauce. Marinades. Taco seasonings. Relishes. Fats and Oils Regular salad dressings. Salted butter. Margarine. Ghee. Bacon fat.   Other Potato and tortilla chips. Corn chips and puffs. Salted popcorn and pretzels. Canned or dried soups. Pizza. Frozen entrees and pot pies.  The items listed above may not be a complete list of foods and beverages to avoid. Contact your dietitian for more information.   This information is not intended to replace advice given to you by your health care provider. Make sure you discuss any questions you have with your health care provider.   Document Released: 01/22/2002 Document Revised: 08/23/2014 Document Reviewed: 06/06/2013 Elsevier Interactive Patient Education [employer].

## 2015-08-20 ENCOUNTER — Telehealth: Payer: Self-pay

## 2015-08-20 NOTE — Telephone Encounter (Signed)
Prior auth sent for Pradaxa 150mg  to [facility].

## 2015-08-26 NOTE — Progress Notes (Signed)
Cardiology Office Note   Date:  08/28/2015   ID:  Eric Mayer, DOB 02/05/1941, MRN [id number]  PCP:  Irven Shelling, MD  Cardiologist:   Sharol Harness, MD   Chief Complaint  Patient presents with  . Follow-up  . paroxysmal atrial fibrillation      Patient ID: Eric Mayer is a 75 y.o. male with HTN, atrial fibrillation, presumed diastolic dysfunction and prostate adenocarcinoma currently undergoing XRT here for follow up.  Interval history 08/28/15: At his last appointment [name] was referred to the hospital for cardioversion and diuresis.  He was hospitalized from 12/15-12/19.  He underwent successful cardioversion for atrial flutter and was diuresed with IV lasix.  He diuresed 12L and lost 17 lb.  [name] followed up with [name] on 08/19/15.  At that appointment he reported not weighing himself since discharge.  He also had not been compliant with a low-sodium, fluid restricted diet.  Despite this, he was 20 lb down from his admission weight.  He was instructed to follow a 2g sodium, 2L fluid restriction.  His lasix was increased to bid for 4 days.  At that appointment he was also in atrial fibrillation with a heart rate of 68 bpm.  [name] continues to feel tired and out of breath.  He is feeling better today than he has felt lately.  He has been weighing himself regularly.  His weight continues to decline since he was in the hospital.  He has also been following a low-sodium, low fluid diet.  He denies orthopnea or PND and his lower extremity edema continues to improve.  He has not noted any chest pain but is short of breath with minimal exertion.  [name] reports that his insurance will no longer pay for Pradaxa.  Interval history 07/31/15: [name] was referred for echocardiography, that revealed normal systolic function but was insufficient for evaluation of diastolic function.  There was mild MR/TR.  At his last appointment he was  started on lasix twice daily for 2 weeks followed by daily for presumed diastolic dysfunction and shortness of breath with LE edema.  BNP was 115.  He was started on Lasix twice daily for 2 weeks followed by daily. He reports increased urinary frequency since starting Lasix. However, he has noted significant weight gain. His weight is 311 pounds today and was 294 when he was seen in November.  [name] has not been feeling well.  He has noted a few episodes of atrial fibrillation with rates in the 140-150s.  He has noted increased palpitations.  The episodes come and go and he is more short of breath.  He has dyspnea on exertion.  He is able to walk with a shopping cart but has difficulty walking without assistance.  He endorses orthopnea and PND.He has been sleeping upright in order to breathe.   Interval history 06/25/15: [name] underwent a dobutamine stress echo that was negative for ischemia.  It also showed normal systolic function.  He continues to have shortness of breath. This occurs with minimal exertion. He thinks that it is been getting worse over the course of the last year. In the last year he's had several setbacks. He initially broke his knee and subsequently underwent right knee replacement. He's also been through cancer treatment. Each time he had prolonged periods of decreased mobility and he has been slowly trying to increase his strength and stamina.  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 He has not noted significant swelling of the right leg.  He denies orthopnea or PND.  He also denies chest pain, palpitations, lightheadedness, or dizziness.  Mr. Troccoli checks his blood pressure at home. Typically runs in the 120s to 130s over 70s. He does note that it increases after strenuous activity.  History of Present Illness 03/17/15: [name] presents for evaluation of dyspnea on exertion.  He had a knee replacement surgery 5/3 and was  able to talk to the street from his house and perform PT.  However, in the last 2-3 weeks he feels more tired with walking and has to stop more frequently.  These episodes are associated with diaphoresis but he denies CP, nausea, lightheadedness or dizziness.  The episodes last several minutes and improve with rest.  He feels completely drained and SOB.  [name] also denies orthopnea or PND.  His weight has increased 15lb over the last several months due to decreased physical activity.  [name]  has chronic L LE edema which is ongoing since his groin sarcoma several years ago.  The R leg has been swollen since his knee surgery.  He has not missed any doses of Pradaxa.  In the past he had similar symptoms in the setting of anemia, though this was assessed by his PCP and reportedly only mildly abnormal.  Dr. [name] has been managing [name]'s atrial fibrillation.  He has been taking Pradaxa and is rate controlled with diltiazem and metoprolol.  To his knowledge his HR has been well-controlled in the recent history  Past Medical History  Diagnosis Date  . Hypertension   . History of radiation therapy 1993    sarcoma of left groin, tx at [facility]  . PAF (paroxysmal atrial fibrillation) ([place])     prior CARDIOLOGIST-- DR Tressia Miners TURNER/   now monitored by dr Jenny Reichmann griffin  . Incomplete right bundle branch block   . History of sarcoma     1993  LEFT GOIN--  S/P SURGERY, RADIATION AND CHEMO IN [place]  . Hypogonadism in male   . History of peptic ulcer     1980's  . History of gastric bypass   . 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 Weakness of left leg     SECONDARY TO SURGICAL NERVE INJURY OF LEFT GOIN  . Nocturia   . Wears glasses   . Depression   . Atrial flutter ([place]) 07/31/2015  . Chronic diastolic heart failure ([place])     Archie Endo 07/31/2015  . OSA on CPAP   . Anemia 1980s X 1  .  Arthritis     "hands, knees, hips, ankles" (07/31/2015)  . Primary prostate adenocarcinoma ([place]) DX 07/08/14    Gleason 7,  stage T1c  . Sarcoma ([place]) ~ 1993/1994    "of groin"    Past Surgical History  Procedure Laterality Date  . Intestinal bypass  1976    GASTRIC FOR OBESITY  . Patella fracture surgery Left ~ 1995    "broke it twice; only had OR once"  . Prostate biopsy  06/2014  . Ventral hernia repair  1977  . Groin dissection Left 1992   [place]    SARCOMA SURGERY  . Colonoscopy  07-03-2002  . Transthoracic echocardiogram  08-08-2003    moderate LVH/  ef 55-65%/  mild MR/  moderate LAE/  trivial TR/  trivial pericardial effusion posterior to the heart  . Forearm fracture surgery  1986  . Radioactive seed implant N/A 10/17/2014    Procedure: RADIOACTIVE SEED IMPLANT    ;  Surgeon: Ailene Rud, MD;  Location: [facility];  Service: Urology;  Laterality: N/A;   68 SEEDS IMPLANTED   . Cardiac catheterization  08-12-2003  dr Tressia Miners turner    Normal coronary arteries, normal wall motion, no sig. abnormalities  . Total knee arthroplasty Right 12/17/2014    Procedure: TOTAL KNEE ARTHROPLASTY;  Surgeon: Melrose Nakayama, MD;  Location: [name];  Service: Orthopedics;  Laterality: Right;  . Fracture surgery    . Tonsillectomy  1950s  . Inguinal hernia repair Right 1950s?  . Joint replacement    . Cardioversion N/A 08/04/2015    Procedure: CARDIOVERSION;  Surgeon: [name], MD;  Location: [place];  Service: Cardiovascular;  Laterality: N/A;     Current Outpatient Prescriptions  Medication Sig Dispense Refill  . acetaminophen (TYLENOL) 325 MG tablet Take 2 tablets (650 mg total) by mouth every 4 (four) hours as needed for headache or mild pain.    [name] amphetamine-dextroamphetamine (ADDERALL XR) 20 MG 24 hr capsule Take 20 mg by mouth daily as needed (ADD).    [name] buPROPion (WELLBUTRIN XL) 300 MG 24 hr tablet Take 300 mg by mouth every morning.    .  cholecalciferol (VITAMIN D) 1000 UNITS tablet Take 1,000 Units by mouth daily.    . Coenzyme Q10 (CO Q 10 PO) Take 300 mg by mouth every evening.     . Cyanocobalamin (VITAMIN B-12 IJ) Inject 1 mL as directed every 30 (thirty) days.     [name] diltiazem (TIAZAC) 240 MG 24 hr capsule Take 240 mg by mouth every morning.     . escitalopram (LEXAPRO) 10 MG tablet Take 10 mg by mouth daily.    . furosemide (LASIX) 40 MG tablet Take 1 tablet (40 mg total) by mouth 2 (two) times daily. And as directed 180 tablet 3  . metoprolol (LOPRESSOR) 50 MG tablet Take 25 mg by mouth 2 (two) times daily.    . Multiple Vitamin (MULTIVITAMIN WITH MINERALS) TABS tablet Take 1 tablet by mouth daily.    . phenazopyridine (PYRIDIUM) 200 MG tablet Take 1 tablet (200 mg total) by mouth 3 (three) times daily as needed for pain. 30 tablet 2  . tamsulosin (FLOMAX) 0.4 MG CAPS capsule Take 1 capsule (0.4 mg total) by mouth daily. 30 capsule 1  . valsartan (DIOVAN) 320 MG tablet Take 1 tablet (320 mg total) by mouth daily. 90 tablet 3  . rivaroxaban (XARELTO) 20 MG TABS tablet Take 1 tablet (20 mg total) by mouth daily with supper. 30 tablet 11   No current facility-administered medications for this visit.    Allergies:   Ace inhibitors    Social History:  The patient  reports that he has never smoked. He has never used smokeless tobacco. He reports that he does not drink alcohol or use illicit drugs.   Family History:  The patient's family history includes Atrial fibrillation in his father; Cancer in his sister; Liver disease in his mother; Renal Disease in his mother; Stroke in his mother.    ROS:  Please see the history of present illness.   Otherwise, review of systems are positive for none.   All other systems are reviewed and negative.    PHYSICAL EXAM: VS:  BP 112/68 mmHg  Pulse 75  Ht 5\' 9"  (1.753 m)  Wt 125.193 kg (276 lb)  BMI 40.74 kg/m2 , BMI Body mass index is 40.74 kg/(m^2). GENERAL:  Well  appearing HEENT:  Pupils equal round and reactive, fundi not visualized, oral mucosa unremarkable NECK:  JVP 1 cm above clavicle sitting upright. Waveform within normal limits.  Carotid upstroke brisk and symmetric, no bruits, no thyromegaly LYMPHATICS:  No cervical,  adenopathy LUNGS:  Clear to auscultation bilaterally CHEST:  Unremarkable HEART:  RRR.  PMI not displaced or sustained,S1 and S2 within normal limits, no S3, no S4, no clicks, no rubs, no murmurs ABD:  Flat, positive bowel sounds normal in frequency in pitch, no bruits, no rebound, no guarding, no midline pulsatile mass, no hepatomegaly, no splenomegaly EXT:  2 plus pulses throughout, trace edema on bilateral anterior tibia.  No cyanosis no clubbing SKIN:  No rashes no nodules.   NEURO:  Cranial nerves II through XII grossly intact, motor grossly intact throughout PSYCH:  Cognitively intact, oriented to person place and time  EKG:  EKG is ordered today.  Sinus rhythm with first degree AV block.  Echo 07/08/15: Study Conclusions  - Left ventricle: The cavity size was normal. Wall thickness was increased in a pattern of moderate LVH. Systolic function was normal. The estimated ejection fraction was in the range of 60% to 65%. The study is not technically sufficient to allow evaluation of LV diastolic function. - Aortic valve: Trileaflet. Sclerosis without stenosis. There was mild regurgitation. - Mitral valve: Mildly thickened leaflets . There was mild regurgitation. - Left atrium: Moderately dilated at 43 ml/m2. - Right atrium: The atrium was mildly dilated. - Tricuspid valve: There was mild regurgitation. - Pulmonary arteries: PA peak pressure: 31 mm Hg (S). - Inferior vena cava: The vessel was normal in size. The respirophasic diameter changes were in the normal range (= 50%), consistent with normal central venous pressure.  Impressions:  - LVEF 60-65%, moderate LVH, normal wall motion, aortic  valve sclerosis with mild AI, MR and TR, RVSP 31 mmHg., moderate LAE, mild RAE.   Recent Labs: 07/31/2015: ALT 26; Hemoglobin 11.3*; Platelets 183; TSH 2.760 08/02/2015: B Natriuretic Peptide 103.2* 08/03/2015: Magnesium 1.5* 08/27/2015: BUN 43*; Creat 1.15; Potassium 5.9*; Sodium 140    Lipid Panel No results found for: CHOL, TRIG, HDL, CHOLHDL, VLDL, LDLCALC, LDLDIRECT    Wt Readings from Last 3 Encounters:  08/28/15 125.193 kg (276 lb)  08/19/15 132.632 kg (292 lb 6.4 oz)  08/04/15 129.321 kg (285 lb 1.6 oz)     Other studies Reviewed: Additional studies/ records that were reviewed today include: n/a  ASSESSMENT AND PLAN:  xarelto 20 mg.  Stop pradaxa Stop K, Mg BMP, Mg in a week Lasix daily  # Atrial fibrillation/flutter:   Mr. Edell is currently in sinus rhythm.  He feels better today than he has recently, which may be due to being back in sinus rhythm.  We will switch from Pradaxa to Xarelto 20 mg daily. Continue metoprolol and diltazem.    This patients CHA2DS2-VASc Score and unadjusted Ischemic Stroke Rate (% per year) is equal to 2.2 % stroke rate/year from a score of 2  Above score calculated as 1 point each if present [CHF, HTN, DM, Vascular=MI/PAD/Aortic Plaque, Age if 65-74, or Male]  Above score calculated as 2 points each if present [Age > 75, or Stroke/TIA/TE]   # Acute on chronic diastolic heart failure:  Volume status continues to improve.  He still remains volume overloaded.  He has been taking lasix bid for several days now.  His creatinine is increasing and potassium is elevated. - Stop potassium and magnesium supplementation - Continue metoprolol and valsartan - Repeat BMP, Mg in 1 week.  Consider adjusting dose if renal function continues to decline. - Compression stockings   # Hypertension:  Blood pressure well-controlled.  Continue metoprolol, diltiazem, and valsartan.  Current medicines are reviewed at length with the patient today.   The patient does not have concerns regarding medicines.  The following changes have been made:  Switch dabigatran   Labs/ tests ordered today include:  Orders Placed This Encounter  Procedures  . Compression stockings  . Basic metabolic panel  . Magnesium  . EKG 12-Lead     Disposition:   FU with Dr. Oval Linsey in 3 months.   Signed, Sharol Harness, MD  08/28/2015 3:25 PM    [place]

## 2015-08-27 ENCOUNTER — Telehealth: Payer: Self-pay

## 2015-08-27 NOTE — Telephone Encounter (Signed)
Pradaxa is no longer a covered drug by [employer] [employer]. They have switched to a "closed" formulary. Preferred drugs are Eliquis or Xarelto. He has an appointment with Dr. Oval Linsey tomorrow am, and will discuss with her.

## 2015-08-28 ENCOUNTER — Ambulatory Visit (INDEPENDENT_AMBULATORY_CARE_PROVIDER_SITE_OTHER): Payer: Medicare Other | Admitting: Cardiovascular Disease

## 2015-08-28 ENCOUNTER — Encounter: Payer: Self-pay | Admitting: Cardiovascular Disease

## 2015-08-28 VITALS — BP 112/68 | HR 75 | Ht 69.0 in | Wt 276.0 lb

## 2015-08-28 DIAGNOSIS — I5031 Acute diastolic (congestive) heart failure: Secondary | ICD-10-CM | POA: Diagnosis not present

## 2015-08-28 DIAGNOSIS — M7989 Other specified soft tissue disorders: Secondary | ICD-10-CM

## 2015-08-28 DIAGNOSIS — Z79899 Other long term (current) drug therapy: Secondary | ICD-10-CM

## 2015-08-28 DIAGNOSIS — I10 Essential (primary) hypertension: Secondary | ICD-10-CM

## 2015-08-28 DIAGNOSIS — I480 Paroxysmal atrial fibrillation: Secondary | ICD-10-CM

## 2015-08-28 LAB — BASIC METABOLIC PANEL
BUN: 43 mg/dL — ABNORMAL HIGH (ref 7–25)
CO2: 26 mmol/L (ref 20–31)
Calcium: 9.3 mg/dL (ref 8.6–10.3)
Chloride: 108 mmol/L (ref 98–110)
Creat: 1.15 mg/dL (ref 0.70–1.18)
Glucose, Bld: 108 mg/dL — ABNORMAL HIGH (ref 65–99)
Potassium: 5.9 mmol/L — ABNORMAL HIGH (ref 3.5–5.3)
Sodium: 140 mmol/L (ref 135–146)

## 2015-08-28 MED ORDER — RIVAROXABAN 20 MG PO TABS: 20.0000 mg | ORAL_TABLET | Freq: Every day | ORAL | Status: DC

## 2015-08-28 NOTE — Patient Instructions (Addendum)
LABS  BMP ,MAGNESIUM IN 7 DAYS AFTER STOPPING MEDICATIONS   NOW,TAKE LASIX ONE TABLET  DAILY  STOP POTASSIUM , MAGNESIUM TABLET  STOP PRADAXA  START XARELTO ONE TABLET DAILY WITH THE EVENING MEAL ( BIGGEST MEAL)  Compression stocking- 15-20 mmhg- bil knee -hi  Your physician wants you to follow-up in 3 MONTHS WITH DR [facility].  You will receive a reminder letter in the mail two months in advance. If you don't receive a letter, please call our office to schedule the follow-up appointment.

## 2015-11-21 ENCOUNTER — Encounter: Payer: Self-pay | Admitting: Cardiovascular Disease

## 2015-11-21 ENCOUNTER — Ambulatory Visit (INDEPENDENT_AMBULATORY_CARE_PROVIDER_SITE_OTHER): Payer: Medicare Other | Admitting: Cardiovascular Disease

## 2015-11-21 VITALS — BP 110/50 | HR 91 | Ht 69.0 in | Wt 273.0 lb

## 2015-11-21 DIAGNOSIS — I5041 Acute combined systolic (congestive) and diastolic (congestive) heart failure: Secondary | ICD-10-CM

## 2015-11-21 DIAGNOSIS — I5032 Chronic diastolic (congestive) heart failure: Secondary | ICD-10-CM

## 2015-11-21 DIAGNOSIS — I493 Ventricular premature depolarization: Secondary | ICD-10-CM | POA: Diagnosis not present

## 2015-11-21 DIAGNOSIS — I110 Hypertensive heart disease with heart failure: Secondary | ICD-10-CM | POA: Diagnosis not present

## 2015-11-21 DIAGNOSIS — I480 Paroxysmal atrial fibrillation: Secondary | ICD-10-CM

## 2015-11-21 HISTORY — DX: Ventricular premature depolarization: I49.3

## 2015-11-21 LAB — BASIC METABOLIC PANEL
BUN: 33 mg/dL — ABNORMAL HIGH (ref 7–25)
CO2: 23 mmol/L (ref 20–31)
Calcium: 9.2 mg/dL (ref 8.6–10.3)
Chloride: 111 mmol/L — ABNORMAL HIGH (ref 98–110)
Creat: 1.07 mg/dL (ref 0.70–1.18)
Glucose, Bld: 97 mg/dL (ref 65–99)
Potassium: 5.6 mmol/L — ABNORMAL HIGH (ref 3.5–5.3)
Sodium: 143 mmol/L (ref 135–146)

## 2015-11-21 MED ORDER — METOPROLOL TARTRATE 50 MG PO TABS: 50.0000 mg | ORAL_TABLET | Freq: Two times a day (BID) | ORAL | Status: DC

## 2015-11-21 MED ORDER — DILTIAZEM HCL ER BEADS 180 MG PO CP24
180.0000 mg | ORAL_CAPSULE | Freq: Every morning | ORAL | Status: DC
Start: 2015-11-21 — End: 2018-12-15

## 2015-11-21 MED ORDER — DILTIAZEM HCL ER BEADS 180 MG PO CP24: 180.0000 mg | ORAL_CAPSULE | Freq: Every morning | ORAL | Status: DC

## 2015-11-21 NOTE — Progress Notes (Signed)
Cardiology Office Note   Date:  11/21/2015   ID:  Eric Mayer, DOB 01/04/1941, MRN [id number]  PCP:  Irven Shelling, MD  Cardiologist:   Sharol Harness, MD   Chief Complaint  Patient presents with  . Follow-up    swelling is better, shortness of breath with activity      Patient ID: Eric Mayer is a 75 y.o. male with HTN, atrial fibrillation, presumed diastolic dysfunction and prostate adenocarcinoma currently undergoing XRT here for follow up.  Interval History 11/21/15: Eric Mayer has been doing fairly well.  He continues to have some shortness of breath with exertion.  His weight has been stable.  He is limiting sodium intake to 1500 mg daily.  His LE edema has been improving. Proximally 6 weeks ago his primary care physician increased his Lasix to 80 mg from 40 mg. He thinks that this is helped. He also notes that his endurance has been improving. He has not been exercising much but his wife is retiring next month and they plan to start walking daily.  He notes that his diet has improved significantly and he is eating lots of vegetables.  Five days ago he had an episode of heart palpitations. The monitor noted that his heart rate was irregular. The episode lasted for over an hour and he felt tired for the rest of the day.  There is no associated chest pain but he did feel more short of breath that day.  Interval history 08/28/15: At his last appointment Eric Mayer was referred to the hospital for cardioversion and diuresis.  He was hospitalized from 12/15-12/19.  He underwent successful cardioversion for atrial flutter and was diuresed with IV lasix.  He diuresed 12L and lost 17 lb.  Eric Mayer followed up with [name] on 08/19/15.  At that appointment he reported not weighing himself since discharge.  He also had not been compliant with a low-sodium, fluid restricted diet.  Despite this, he was 20 lb down from his admission weight.  He was instructed to follow  a 2g sodium, 2L fluid restriction.  His lasix was increased to bid for 4 days.  At that appointment he was also in atrial fibrillation with a heart rate of 68 bpm.  Eric Mayer continues to feel tired and out of breath.  He is feeling better today than he has felt lately.  He has been weighing himself regularly.  His weight continues to decline since he was in the hospital.  He has also been following a low-sodium, low fluid diet.  He denies orthopnea or PND and his lower extremity edema continues to improve.  He has not noted any chest pain but is short of breath with minimal exertion.  Eric Mayer reports that his insurance will no longer pay for Pradaxa.  Interval history 07/31/15: Eric Mayer was referred for echocardiography, that revealed normal systolic function but was insufficient for evaluation of diastolic function.  There was mild MR/TR.  At his last appointment he was started on lasix twice daily for 2 weeks followed by daily for presumed diastolic dysfunction and shortness of breath with LE edema.  BNP was 115.  He was started on Lasix twice daily for 2 weeks followed by daily. He reports increased urinary frequency since starting Lasix. However, he has noted significant weight gain. His weight is 311 pounds today and was 294 when he was seen in November.  Eric Mayer has not been feeling well.  He has  noted a few episodes of atrial fibrillation with rates in the 140-150s.  He has noted increased palpitations.  The episodes come and go and he is more short of breath.  He has dyspnea on exertion.  He is able to walk with a shopping cart but has difficulty walking without assistance.  He endorses orthopnea and PND.He has been sleeping upright in order to breathe.   Interval history 06/25/15: Eric Mayer underwent a dobutamine stress echo that was negative for ischemia.  It also showed normal systolic function.  He continues to have shortness of breath. This occurs with minimal exertion. He thinks  that it is been getting worse over the course of the last year. In the last year he's had several setbacks. He initially broke his knee and subsequently underwent right knee replacement. He's also been through cancer treatment. Each time he had prolonged periods of decreased mobility and he has been slowly trying to increase his strength and stamina.  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 He has not noted significant swelling of the right leg.  He denies orthopnea or PND.  He also denies chest pain, palpitations, lightheadedness, or dizziness.  Eric Mayer checks his blood pressure at home. Typically runs in the 120s to 130s over 70s. He does note that it increases after strenuous activity.  History of Present Illness 03/17/15: Eric Mayer presents for evaluation of dyspnea on exertion.  He had a knee replacement surgery 5/3 and was able to talk to the street from his house and perform PT.  However, in the last 2-3 weeks he feels more tired with walking and has to stop more frequently.  These episodes are associated with diaphoresis but he denies CP, nausea, lightheadedness or dizziness.  The episodes last several minutes and improve with rest.  He feels completely drained and SOB.  Eric Mayer also denies orthopnea or PND.  His weight has increased 15lb over the last several months due to decreased physical activity.  Eric Mayer  has chronic L LE edema which is ongoing since his groin sarcoma several years ago.  The R leg has been swollen since his knee surgery.  He has not missed any doses of Pradaxa.  In the past he had similar symptoms in the setting of anemia, though this was assessed by his PCP and reportedly only mildly abnormal.  Dr. [name] has been managing Eric Mayer's atrial fibrillation.  He has been taking Pradaxa and is rate controlled with diltiazem and metoprolol.  To his knowledge his HR has been well-controlled in the recent  history  Past Medical History  Diagnosis Date  . Hypertension   . History of radiation therapy 1993    sarcoma of left groin, tx at [facility]  . PAF (paroxysmal atrial fibrillation) ([place])     prior CARDIOLOGIST-- DR Tressia Miners Mayer/   now monitored by dr Jenny Reichmann griffin  . Incomplete right bundle branch block   . History of sarcoma     1993  LEFT GOIN--  S/P SURGERY, RADIATION AND CHEMO IN [place]  . Hypogonadism in male   . History of peptic ulcer     1980's  . History of gastric bypass   . 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 Weakness of left leg     SECONDARY TO SURGICAL NERVE INJURY OF  LEFT GOIN  . Nocturia   . Wears glasses   . Depression   . Atrial flutter ([place]) 07/31/2015  . Chronic diastolic heart failure ([place])     Archie Endo 07/31/2015  . OSA on CPAP   . Anemia 1980s X 1  . Arthritis     "hands, knees, hips, ankles" (07/31/2015)  . Primary prostate adenocarcinoma ([place]) DX 07/08/14    Gleason 7,  stage T1c  . Sarcoma ([place]) ~ 1993/1994    "of groin"  . PVC's (premature ventricular contractions) 11/21/2015    Past Surgical History  Procedure Laterality Date  . Intestinal bypass  1976    GASTRIC FOR OBESITY  . Patella fracture surgery Left ~ 1995    "broke it twice; only had OR once"  . Prostate biopsy  06/2014  . Ventral hernia repair  1977  . Groin dissection Left 1992   [place]    SARCOMA SURGERY  . Colonoscopy  07-03-2002  . Transthoracic echocardiogram  08-08-2003    moderate LVH/  ef 55-65%/  mild MR/  moderate LAE/  trivial TR/  trivial pericardial effusion posterior to the heart  . Forearm fracture surgery  1986  . Radioactive seed implant N/A 10/17/2014    Procedure: RADIOACTIVE SEED IMPLANT    ;  Surgeon: Ailene Rud, MD;  Location[facility];  Service: Urology;  Laterality: N/A;   68 SEEDS IMPLANTED   . Cardiac catheterization  08-12-2003  dr  Tressia Miners Mayer    Normal coronary arteries, normal wall motion, no sig. abnormalities  . Total knee arthroplasty Right 12/17/2014    Procedure: TOTAL KNEE ARTHROPLASTY;  Surgeon: Melrose Nakayama, MD;  Location: [place];  Service: Orthopedics;  Laterality: Right;  . Fracture surgery    . Tonsillectomy  1950s  . Inguinal hernia repair Right 1950s?  . Joint replacement    . Cardioversion N/A 08/04/2015    Procedure: CARDIOVERSION;  Surgeon: [name], MD;  Location: [place];  Service: Cardiovascular;  Laterality: N/A;     Current Outpatient Prescriptions  Medication Sig Dispense Refill  . acetaminophen (TYLENOL) 325 MG tablet Take 2 tablets (650 mg total) by mouth every 4 (four) hours as needed for headache or mild pain.    [name] amphetamine-dextroamphetamine (ADDERALL XR) 20 MG 24 hr capsule Take 20 mg by mouth daily as needed (ADD).    [name] buPROPion (WELLBUTRIN XL) 300 MG 24 hr tablet Take 300 mg by mouth every morning.    . cholecalciferol (VITAMIN D) 1000 UNITS tablet Take 1,000 Units by mouth daily.    . Coenzyme Q10 (CO Q 10 PO) Take 300 mg by mouth every evening.     . Cyanocobalamin (VITAMIN B-12 IJ) Inject 1 mL as directed every 30 (thirty) days.     [name] diltiazem (TIAZAC) 180 MG 24 hr capsule Take 1 capsule (180 mg total) by mouth every morning. 14 capsule 1  . escitalopram (LEXAPRO) 10 MG tablet Take 10 mg by mouth daily.    . furosemide (LASIX) 80 MG tablet 80 mg daily.    . metoprolol (LOPRESSOR) 50 MG tablet Take 1 tablet (50 mg total) by mouth 2 (two) times daily. 180 tablet 1  . Multiple Vitamin (MULTIVITAMIN WITH MINERALS) TABS tablet Take 1 tablet by mouth daily.    . Multiple Vitamins-Minerals (PRESERVISION AREDS 2 PO) Take by mouth daily. Takes 2 daily    . phenazopyridine (PYRIDIUM) 200 MG tablet Take 1 tablet (200 mg total) by mouth 3 (three)  times daily as needed for pain. 30 tablet 2  . rivaroxaban (XARELTO) 20 MG TABS tablet Take 1 tablet (20 mg total) by mouth daily  with supper. 30 tablet 11  . tamsulosin (FLOMAX) 0.4 MG CAPS capsule Take 1 capsule (0.4 mg total) by mouth daily. (Patient taking differently: Take 0.4 mg by mouth daily. Takes 2 at night) 30 capsule 1  . valsartan (DIOVAN) 320 MG tablet Take 1 tablet (320 mg total) by mouth daily. 90 tablet 3   No current facility-administered medications for this visit.    Allergies:   Ace inhibitors    Social History:  The patient  reports that he has never smoked. He has never used smokeless tobacco. He reports that he does not drink alcohol or use illicit drugs.   Family History:  The patient's family history includes Atrial fibrillation in his father; Cancer in his sister; Liver disease in his mother; Renal Disease in his mother; Stroke in his mother.    ROS:  Please see the history of present illness.   Otherwise, review of systems are positive for none.   All other systems are reviewed and negative.    PHYSICAL EXAM: VS:  BP 110/50 mmHg  Pulse 91  Ht 5\' 9"  (1.753 m)  Wt 123.832 kg (273 lb)  BMI 40.30 kg/m2 , BMI Body mass index is 40.3 kg/(m^2). GENERAL:  Well appearing HEENT:  Pupils equal round and reactive, fundi not visualized, oral mucosa unremarkable NECK:  JVP 1 cm above clavicle sitting upright. Waveform within normal limits.  Carotid upstroke brisk and symmetric, no bruits, no thyromegaly LYMPHATICS:  No cervical,  adenopathy LUNGS:  Clear to auscultation bilaterally CHEST:  Unremarkable HEART:  RRR.  PMI not displaced or sustained,S1 and S2 within normal limits, no S3, no S4, no clicks, no rubs, no murmurs ABD:  Flat, positive bowel sounds normal in frequency in pitch, no bruits, no rebound, no guarding, no midline pulsatile mass, no hepatomegaly, no splenomegaly EXT:  2 plus pulses throughout, 1+ pitting edema on bilateral anterior tibia.  No cyanosis no clubbing SKIN:  No rashes no nodules.   NEURO:  Cranial nerves II through XII grossly intact, motor grossly intact  throughout PSYCH:  Cognitively intact, oriented to person place and time  EKG:  EKG is ordered today.  Sinus rhythm with ventricular bigeminy. A thyroglobulin what type 1 diabetes mellitus feeling was normal  Echo 07/08/15: Study Conclusions  - Left ventricle: The cavity size was normal. Wall thickness was increased in a pattern of moderate LVH. Systolic function was normal. The estimated ejection fraction was in the range of 60% to 65%. The study is not technically sufficient to allow evaluation of LV diastolic function. - Aortic valve: Trileaflet. Sclerosis without stenosis. There was mild regurgitation. - Mitral valve: Mildly thickened leaflets . There was mild regurgitation. - Left atrium: Moderately dilated at 43 ml/m2. - Right atrium: The atrium was mildly dilated. - Tricuspid valve: There was mild regurgitation. - Pulmonary arteries: PA peak pressure: 31 mm Hg (S). - Inferior vena cava: The vessel was normal in size. The respirophasic diameter changes were in the normal range (= 50%), consistent with normal central venous pressure.  Impressions:  - LVEF 60-65%, moderate LVH, normal wall motion, aortic valve sclerosis with mild AI, MR and TR, RVSP 31 mmHg., moderate LAE, mild RAE.   Recent Labs: 07/31/2015: ALT 26; Hemoglobin 11.3*; Platelets 183; TSH 2.760 08/02/2015: B Natriuretic Peptide 103.2* 08/03/2015: Magnesium 1.5* 08/27/2015: BUN 43*; Creat 1.15;  Potassium 5.9*; Sodium 140    Lipid Panel No results found for: CHOL, TRIG, HDL, CHOLHDL, VLDL, LDLCALC, LDLDIRECT    Wt Readings from Last 3 Encounters:  11/21/15 123.832 kg (273 lb)  08/28/15 125.193 kg (276 lb)  08/19/15 132.632 kg (292 lb 6.4 oz)     Other studies Reviewed: Additional studies/ records that were reviewed today include: n/a  ASSESSMENT AND PLAN:  # Atrial fibrillation/flutter: # PVCs: Mr. Nava is in sinus rhythm with frequent PVCs. On repeat EKG he continued to  have occasional PVCs but was no longer in ventricular bigeminy. It is unclear whether the palpitations he had on Sunday or PVCs or an episode of atrial fibrillation. We will check a basic metabolic panel to assess his potassium levels. We will also increase his metoprolol to 50 mg twice a day and reduce diltiazem to 180 mg daily.  Continue Xarelto.  This patients CHA2DS2-VASc Score and unadjusted Ischemic Stroke Rate (% per year) is equal to 2.2 % stroke rate/year from a score of 2  Above score calculated as 1 point each if present [CHF, HTN, DM, Vascular=MI/PAD/Aortic Plaque, Age if 65-74, or Male]  Above score calculated as 2 points each if present [Age > 75, or Stroke/TIA/TE]  # Chronic diastolic heart failure:  Volume status continues to improve.  He still remains mildly volume overloaded.  his Lasix was recently increased to 80 mg daily. We will check a basic metabolic panel today as well as a BNP before adjusting his Lasix dose. Continue metoprolol and valsartan.   # Hypertension:  Blood pressure well-controlled.  Continue metoprolol, diltiazem, and valsartan.  Current medicines are reviewed at length with the patient today.  The patient does not have concerns regarding medicines.  The following changes have been made:  Switch dabigatran   Labs/ tests ordered today include:  Orders Placed This Encounter  Procedures  . Basic metabolic panel  . B Nat Peptide  . ECHOCARDIOGRAM COMPLETE     Disposition:   FU with Dr. Oval Linsey in 3 months.   Signed, Sharol Harness, MD  11/21/2015 1:40 PM    [facility]

## 2015-11-21 NOTE — Addendum Note (Signed)
Addended by: Alvina Filbert B on: 11/21/2015 05:50 PM   Modules accepted: Orders

## 2015-11-21 NOTE — Patient Instructions (Addendum)
Medication Instructions:  DECREASE YOUR DILTIAZEM TO 180 MG DAILY  INCREASE YOUR METOPROLOL TO 50 MG TWICE A DAY   Labwork: BMET AT [facility] ON FIRST FLOOR  Testing/Procedures: Your physician has requested that you have an echocardiogram. Echocardiography is a painless test that uses sound waves to create images of your heart. It provides your doctor with information about the size and shape of your heart and how well your heart's chambers and valves are working. This procedure takes approximately one hour. There are no restrictions for this procedure. [place]: Your physician recommends that you schedule a follow-up appointment in: [place]  If you need a refill on your cardiac medications before your next appointment, please call your pharmacy.

## 2015-11-22 LAB — BRAIN NATRIURETIC PEPTIDE: Brain Natriuretic Peptide: 55.6 pg/mL (ref <100)

## 2015-11-25 ENCOUNTER — Telehealth: Payer: Self-pay | Admitting: *Deleted

## 2015-11-25 DIAGNOSIS — Z79899 Other long term (current) drug therapy: Secondary | ICD-10-CM

## 2015-11-25 DIAGNOSIS — E875 Hyperkalemia: Secondary | ICD-10-CM

## 2015-11-25 NOTE — Telephone Encounter (Signed)
-----   Message from [name], MD sent at 11/23/2015  9:12 AM EDT ----- BNP level is normal, indicating that his heart failure is well-controlled at this time.  Potassium level is high.  Cut back on all the extra potassium he has been trying to take in through his diet.  Repeat BMP in 1 week.  If potassium remains high we may have to reduce his valsartan.

## 2015-11-25 NOTE — Telephone Encounter (Signed)
Advised patient of lab results and will have labs rechecked next week

## 2015-12-05 LAB — BASIC METABOLIC PANEL
BUN: 24 mg/dL (ref 7–25)
CO2: 25 mmol/L (ref 20–31)
Calcium: 8.8 mg/dL (ref 8.6–10.3)
Chloride: 112 mmol/L — ABNORMAL HIGH (ref 98–110)
Creat: 0.93 mg/dL (ref 0.70–1.18)
Glucose, Bld: 85 mg/dL (ref 65–99)
Potassium: 5 mmol/L (ref 3.5–5.3)
Sodium: 145 mmol/L (ref 135–146)

## 2015-12-08 ENCOUNTER — Telehealth: Payer: Self-pay | Admitting: Cardiovascular Disease

## 2015-12-08 NOTE — Telephone Encounter (Signed)
Let's try Eliquis 5 mg bid instead.

## 2015-12-08 NOTE — Telephone Encounter (Signed)
Spoke with patient Symptoms lasting for over 1 week - Sharp pain near waist bilaterally States he has to be careful in the way he moves Noticed an advertisement on TV about back aches as side effect of the med, so he looked this up in his med pamphlet He reports pain in neck muscles for the past week or so He states has a new easy chair and originally thought this was causing his symptoms He has no bleeding issues Patient states he took pradaxa for a few years - just transitioned to xarelto 2 weeks ago - changed per insurance   Patient notified of lab results done on 4/21 Patient will not be home until this afternoon - has a dental appt Patient will need refill on xarelto if he is to remain on this

## 2015-12-08 NOTE — Telephone Encounter (Signed)
New message  Pt c/o medication issue: 1. Name of Medication: Xarelto    4. What is your medication issue? Lower back and muscle aches in shoulders and joints. Request a call back to discuss these symptoms

## 2015-12-09 MED ORDER — APIXABAN 5 MG PO TABS: 5.0000 mg | ORAL_TABLET | Freq: Two times a day (BID) | ORAL | Status: DC

## 2015-12-09 NOTE — Telephone Encounter (Signed)
Spoke with patient, he describes as bilateral pain above buttocks, has been worsening over past week.  No fever, chills or other symptoms.  Will start Eliquis 5 mg bid, have card for free 30 days supply at front desk for patient.

## 2015-12-11 ENCOUNTER — Ambulatory Visit (HOSPITAL_COMMUNITY): Payer: Medicare Other | Attending: Cardiology

## 2015-12-11 ENCOUNTER — Other Ambulatory Visit: Payer: Self-pay

## 2015-12-11 DIAGNOSIS — I77810 Thoracic aortic ectasia: Secondary | ICD-10-CM | POA: Insufficient documentation

## 2015-12-11 DIAGNOSIS — I351 Nonrheumatic aortic (valve) insufficiency: Secondary | ICD-10-CM | POA: Diagnosis not present

## 2015-12-11 DIAGNOSIS — G4733 Obstructive sleep apnea (adult) (pediatric): Secondary | ICD-10-CM | POA: Diagnosis not present

## 2015-12-11 DIAGNOSIS — I110 Hypertensive heart disease with heart failure: Secondary | ICD-10-CM | POA: Diagnosis not present

## 2015-12-11 DIAGNOSIS — I5041 Acute combined systolic (congestive) and diastolic (congestive) heart failure: Secondary | ICD-10-CM | POA: Diagnosis not present

## 2015-12-11 DIAGNOSIS — E669 Obesity, unspecified: Secondary | ICD-10-CM | POA: Diagnosis not present

## 2015-12-11 DIAGNOSIS — Z6841 Body Mass Index (BMI) 40.0 and over, adult: Secondary | ICD-10-CM | POA: Insufficient documentation

## 2015-12-12 ENCOUNTER — Telehealth: Payer: Self-pay | Admitting: *Deleted

## 2015-12-12 NOTE — Telephone Encounter (Signed)
-----   Message from [name], MD sent at 12/11/2015  7:17 PM EDT ----- Heart is squeezing well but does not relax completely.  This is probably why he retains fluid.  We will need to keep his blood pressure under good control.  Aorta is mildly dilated.  We will need to do another echo in 1 year to follow this.

## 2015-12-16 NOTE — Telephone Encounter (Signed)
Notes Recorded by Earvin Hansen on 12/12/2015 at 11:56 AM Advised patient

## 2016-02-06 ENCOUNTER — Other Ambulatory Visit: Payer: Self-pay | Admitting: *Deleted

## 2016-02-06 ENCOUNTER — Telehealth: Payer: Self-pay | Admitting: Cardiovascular Disease

## 2016-02-06 MED ORDER — APIXABAN 5 MG PO TABS: 5.0000 mg | ORAL_TABLET | Freq: Two times a day (BID) | ORAL | Status: DC

## 2016-02-06 NOTE — Telephone Encounter (Signed)
Patient/pharmacy requested a ninety day supply.

## 2016-02-06 NOTE — Telephone Encounter (Signed)
Spoke with patient and his weight has been fluctuating over the last week and is having increased swelling  Denies any change in his breathing Confirmed he is taking Furosemide 80 mg daily He took an extra Furosemide a few days ago and again today.  Advised if his weight was up 2 pounds or more tomorrow to call office for on call MD to be in touch Avoid sodium intake Scheduled appointment for Friday next week with Suanne Marker B PA Patient to call back Monday morning with update

## 2016-02-06 NOTE — Telephone Encounter (Signed)
New Message  Pt call requesting a soon appt than 7/18. Pt states he is retaining fluid and is gaining weight and is concerned. Please call back to discuss

## 2016-02-06 NOTE — Telephone Encounter (Signed)
Left message for pt to call.

## 2016-02-06 NOTE — Telephone Encounter (Signed)
Follow up ° ° ° ° ° °Returning a call to the nurse °

## 2016-02-09 NOTE — Telephone Encounter (Signed)
Spoke with pt, he reports not much of a change in weight. He took extra furosemide over the weekend. Last night he had to get up to the recliner because of coughing. He just took another furosemide for today, he has not been going to the bathroom alot today. He has an appointment on Friday this week and refused a sooner appt. Patient voiced understanding to call if symptoms do not improve or worsen prior to Friday.

## 2016-02-13 ENCOUNTER — Ambulatory Visit (INDEPENDENT_AMBULATORY_CARE_PROVIDER_SITE_OTHER): Payer: Medicare Other | Admitting: Physician Assistant

## 2016-02-13 ENCOUNTER — Encounter: Payer: Self-pay | Admitting: Physician Assistant

## 2016-02-13 VITALS — BP 92/60 | HR 77 | Ht 70.5 in | Wt 275.6 lb

## 2016-02-13 DIAGNOSIS — I4891 Unspecified atrial fibrillation: Secondary | ICD-10-CM | POA: Insufficient documentation

## 2016-02-13 DIAGNOSIS — I10 Essential (primary) hypertension: Secondary | ICD-10-CM

## 2016-02-13 DIAGNOSIS — I5032 Chronic diastolic (congestive) heart failure: Secondary | ICD-10-CM

## 2016-02-13 DIAGNOSIS — I480 Paroxysmal atrial fibrillation: Secondary | ICD-10-CM | POA: Diagnosis not present

## 2016-02-13 DIAGNOSIS — I952 Hypotension due to drugs: Secondary | ICD-10-CM | POA: Diagnosis not present

## 2016-02-13 LAB — CBC
HCT: 36 % — ABNORMAL LOW (ref 38.5–50.0)
Hemoglobin: 11.6 g/dL — ABNORMAL LOW (ref 13.2–17.1)
MCH: 30.2 pg (ref 27.0–33.0)
MCHC: 32.2 g/dL (ref 32.0–36.0)
MCV: 93.8 fL (ref 80.0–100.0)
MPV: 11.3 fL (ref 7.5–12.5)
Platelets: 214 10*3/uL (ref 140–400)
RBC: 3.84 MIL/uL — ABNORMAL LOW (ref 4.20–5.80)
RDW: 13.9 % (ref 11.0–15.0)
WBC: 8 10*3/uL (ref 3.8–10.8)

## 2016-02-13 LAB — BASIC METABOLIC PANEL
BUN: 35 mg/dL — ABNORMAL HIGH (ref 7–25)
CO2: 20 mmol/L (ref 20–31)
Calcium: 9.1 mg/dL (ref 8.6–10.3)
Chloride: 108 mmol/L (ref 98–110)
Creat: 1.06 mg/dL (ref 0.70–1.18)
Glucose, Bld: 101 mg/dL — ABNORMAL HIGH (ref 65–99)
Potassium: 5.2 mmol/L (ref 3.5–5.3)
Sodium: 139 mmol/L (ref 135–146)

## 2016-02-13 MED ORDER — VALSARTAN 320 MG PO TABS: 160.0000 mg | ORAL_TABLET | Freq: Every day | ORAL | Status: DC

## 2016-02-13 MED ORDER — FUROSEMIDE 80 MG PO TABS: 80.0000 mg | ORAL_TABLET | Freq: Every day | ORAL | Status: DC

## 2016-02-13 NOTE — Progress Notes (Signed)
Cardiology Office Note   Date:  02/13/2016   ID:  Eric Mayer, DOB July 17, 1941, MRN [id number]  PCP:  Irven Shelling, MD  Cardiologist:  Dr Oliver Barre, PA-C   Chief Complaint  Patient presents with  . Follow-up    sob; when walking. lightheaded; when getting up to fast randomly. edema; in legs and ankles.    History of Present Illness: Eric Mayer is a 75 y.o. male with a history of D-CHF, OSA w/ CPAP, PAF, atrial flutter w/ DCCV 07/2015 on Eliquis w/ CHADS2VASC=3 (CHF, HTN, age x 1), HTN, adeno CA prostate w/ XRT, DBA echo neg for ischemia, EF nl.  Eric Mayer presents for Evaluation follow-up of his diastolic CHF.   Eric Mayer is frustrated by his weight. He says that his weight will Berry by as much as 5 pounds from one day to the next. He feels that he does a pretty good job of controlling it in until mid the sodium content of multiple foods that he likes. However, he still tends to eat foods that he knows aren't not best choices upon occasion and admits that this is probably why his weight fluctuates so much.  He takes an extra Lasix tablet occasionally to control fluid which seems to do pretty well. He does not have consistent lower extremity edema. He gets some in the daytime but not that much. A few nights ago, his weight was up and he was snoring heavily so he got up and sat in the chair so he can sleep better. This worked, he was able to get some rest, and he took an extra Lasix the next day, which helped the fluid.  He has some dysuria, but has not been taking the Pyridium lately. He is doing much better on the Eliquis than he was on the Xarelto. He had a great deal of knee pain on the Xarelto, but this has greatly improved since he has been off of it.  He gets a little bit lightheaded at times with orthostatic dizziness. His blood pressure is 90 to systolic in the office today and he states he does tend to get a little bit lightheaded when  he moves suddenly or stands. He has not been having palpitations and does not think he has had any more of the atrial fibrillation.   Past Medical History  Diagnosis Date  . Hypertension   . History of radiation therapy 1993    sarcoma of left groin, tx at [facility]  . PAF (paroxysmal atrial fibrillation) ([place])     prior CARDIOLOGIST-- DR Tressia Miners TURNER/   now monitored by dr Jenny Reichmann griffin  . Incomplete right bundle branch block   . History of sarcoma     1993  LEFT GOIN--  S/P SURGERY, RADIATION AND CHEMO IN [place]  . Hypogonadism in male   . History of peptic ulcer     1980's  . History of gastric bypass   . 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 Weakness of left leg     SECONDARY TO SURGICAL NERVE INJURY OF LEFT GOIN  . Nocturia   . Wears glasses   . Depression   . Atrial flutter ([place]) 07/31/2015  . Chronic diastolic heart failure ([place])     Eric Mayer 07/31/2015  . OSA on CPAP   . Anemia 1980s X 1  .  Arthritis     "hands, knees, hips, ankles" (07/31/2015)  . 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 Sarcoma ([place]) ~ 1993/1994    "of groin"  . PVC's (premature ventricular contractions) 11/21/2015    Past Surgical History  Procedure Laterality Date  . Intestinal bypass  1976    GASTRIC FOR OBESITY  . Patella fracture surgery Left ~ 1995    "broke it twice; only had OR once"  . Prostate biopsy  06/2014  . Ventral hernia repair  1977  . Groin dissection Left 1992   [place]    SARCOMA SURGERY  . Colonoscopy  07-03-2002  . Transthoracic echocardiogram  08-08-2003    moderate LVH/  ef 55-65%/  mild MR/  moderate LAE/  trivial TR/  trivial pericardial effusion posterior to the heart  . Forearm fracture surgery  1986  . Radioactive seed implant N/A 10/17/2014    Procedure: RADIOACTIVE SEED IMPLANT    ;  Surgeon: Ailene Rud, MD;  Location:  [facility];  Service: Urology;  Laterality: N/A;   68 SEEDS IMPLANTED   . Cardiac catheterization  08-12-2003  dr Tressia Miners turner    Normal coronary arteries, normal wall motion, no sig. abnormalities  . Total knee arthroplasty Right 12/17/2014    Procedure: TOTAL KNEE ARTHROPLASTY;  Surgeon: Melrose Nakayama, MD;  Location: [place];  Service: Orthopedics;  Laterality: Right;  . Fracture surgery    . Tonsillectomy  1950s  . Inguinal hernia repair Right 1950s?  . Joint replacement    . Cardioversion N/A 08/04/2015    Procedure: CARDIOVERSION;  Surgeon: [name], MD;  Location: [place];  Service: Cardiovascular;  Laterality: N/A;    Current Outpatient Prescriptions  Medication Sig Dispense Refill  . acetaminophen (TYLENOL) 325 MG tablet Take 2 tablets (650 mg total) by mouth every 4 (four) hours as needed for headache or mild pain.    [name] amphetamine-dextroamphetamine (ADDERALL XR) 20 MG 24 hr capsule Take 20 mg by mouth daily as needed (ADD).    [name] apixaban (ELIQUIS) 5 MG TABS tablet Take 1 tablet (5 mg total) by mouth 2 (two) times daily. 180 tablet 1  . buPROPion (WELLBUTRIN XL) 300 MG 24 hr tablet Take 300 mg by mouth every morning.    . cholecalciferol (VITAMIN D) 1000 UNITS tablet Take 1,000 Units by mouth daily.    . Coenzyme Q10 (CO Q 10 PO) Take 300 mg by mouth every evening.     . Cyanocobalamin (VITAMIN B-12 IJ) Inject 1 mL as directed every 30 (thirty) days.     [name] diltiazem (TIAZAC) 180 MG 24 hr capsule Take 1 capsule (180 mg total) by mouth every morning. 14 capsule 1  . escitalopram (LEXAPRO) 10 MG tablet Take 10 mg by mouth daily.    . furosemide (LASIX) 80 MG tablet 80 mg daily.    . metoprolol (LOPRESSOR) 50 MG tablet Take 1 tablet (50 mg total) by mouth 2 (two) times daily. 180 tablet 1  . Multiple Vitamins-Minerals (PRESERVISION AREDS 2 PO) Take by mouth daily. Takes 2 daily    . phenazopyridine (PYRIDIUM) 200 MG tablet Take 1 tablet (200 mg total) by  mouth 3 (three) times daily as needed for pain. 30 tablet 2  . valsartan (DIOVAN) 320 MG tablet Take 1 tablet (320 mg total) by mouth daily. 90 tablet 3  . tamsulosin (FLOMAX) 0.4 MG CAPS capsule      No  current facility-administered medications for this visit.    Allergies:   Ace inhibitors and Xarelto    Social History:  The patient  reports that he has never smoked. He has never used smokeless tobacco. He reports that he does not drink alcohol or use illicit drugs.   Family History:  The patient's family history includes Atrial fibrillation in his father; Cancer in his sister; Liver disease in his mother; Renal Disease in his mother; Stroke in his mother.    ROS:  Please see the history of present illness. All other systems are reviewed and negative.    PHYSICAL EXAM: VS:  BP 92/60 mmHg  Pulse 77  Ht 5' 10.5" (1.791 m)  Wt 275 lb 9.6 oz (125.011 kg)  BMI 38.97 kg/m2 , BMI Body mass index is 38.97 kg/(m^2). GEN: Well nourished, well developed, male in no acute distress HEENT: normal for age  Neck: no JVD, minimal hepatojugular reflux no carotid bruit, no masses Cardiac: RRR; soft murmur, no rubs, or gallops Respiratory:  clear to auscultation bilaterally, normal work of breathing GI: soft, nontender, nondistended, + BS MS: no deformity or atrophy; trace edema; distal pulses are 2+ in all 4 extremities  Skin: warm and dry, no rash Neuro:  Strength and sensation are intact Psych: euthymic mood, full affect   EKG:  EKG is ordered today. The ekg ordered today demonstrates sinus rhythm with an incomplete right bundle branch block, QRS duration 104 ms, borderline first-degree AV block with PR interval 200 ms   Recent Labs: 07/31/2015: ALT 26; Hemoglobin 11.3*; Platelets 183; TSH 2.760 08/03/2015: Magnesium 1.5* 11/21/2015: Brain Natriuretic Peptide 55.6 12/05/2015: BUN 24; Creat 0.93; Potassium 5.0; Sodium 145    Lipid Panel No results found for: CHOL, TRIG, HDL, CHOLHDL,  VLDL, LDLCALC, LDLDIRECT   Wt Readings from Last 3 Encounters:  02/13/16 275 lb 9.6 oz (125.011 kg)  11/21/15 273 lb (123.832 kg)  08/28/15 276 lb (125.193 kg)     Other studies Reviewed: Additional studies/ records that were reviewed today include: Office Notes hospital records and testing.  ASSESSMENT AND PLAN:  1.  Chronic diastolic CHF: His weight is actually stable and his volume status looks good. We discussed the need to limit dietary indiscretions but it is okay to take an extra Lasix occasionally to help with volume control. We will give him extra tablets on his prescription for this. We will check a BMET today because it has been a while since he's had one. He is encouraged to continue monitoring his sodium intake carefully. In general, he does very well.  2. History of atrial fibrillation and atrial flutter: He is maintaining sinus rhythm today. He is having no palpitations indicating otherwise. Continue the Cardizem and metoprolol at current doses.  3. Hypotension: His blood pressure is a little bit low in the office today. He's been having some problems with occasional orthostatic dizziness. He does not know if this is after he has taken an extra Lasix or volume overload or not. We will check a BMET today. We will also decrease his Diovan from 320 mg down to 160 mg daily. If he continues to have problems with orthostatic dizziness, he will need a more formal evaluation for this. He is also concerned that he may be losing blood from the Eliquis as he has been anemic in the past. We will check a CBC.    Current medicines are reviewed at length with the patient today.  The patient does not have concerns regarding  medicines.  The following changes have been made:  Decreased Diovan  Labs/ tests ordered today include:   Orders Placed This Encounter  Procedures  . CBC  . Basic metabolic panel  . EKG 12-Lead     Disposition:   FU with Dr. Oval Linsey  Signed, [name],  PA-C  02/13/2016 12:59 PM    [place] Phone: [phone]; Fax: [phone]  This note was written with the assistance of speech recognition software. Please excuse any transcriptional errors.

## 2016-02-13 NOTE — Patient Instructions (Signed)
Your physician has recommended you make the following change in your medication:   1.) the valsartan has been decreased to 1/2 tablet daily. ( 160 mg).  2.) you may take a extra 80 mg of furosemide daily as needed for swelling.  Your physician recommends that you return for lab work in [place].   Your physician recommends that you schedule a follow-up appointment in: 2 months with Dr Oval Linsey.

## 2016-03-02 ENCOUNTER — Ambulatory Visit: Payer: Medicare Other | Admitting: Cardiovascular Disease

## 2016-04-22 ENCOUNTER — Encounter: Payer: Self-pay | Admitting: Cardiovascular Disease

## 2016-04-22 ENCOUNTER — Ambulatory Visit (INDEPENDENT_AMBULATORY_CARE_PROVIDER_SITE_OTHER): Payer: Medicare Other | Admitting: Cardiovascular Disease

## 2016-04-22 VITALS — BP 104/67 | HR 69 | Ht 68.0 in | Wt 279.8 lb

## 2016-04-22 DIAGNOSIS — I10 Essential (primary) hypertension: Secondary | ICD-10-CM | POA: Diagnosis not present

## 2016-04-22 DIAGNOSIS — I5032 Chronic diastolic (congestive) heart failure: Secondary | ICD-10-CM | POA: Diagnosis not present

## 2016-04-22 DIAGNOSIS — I480 Paroxysmal atrial fibrillation: Secondary | ICD-10-CM | POA: Diagnosis not present

## 2016-04-22 NOTE — Progress Notes (Signed)
Cardiology Office Note   Date:  04/22/2016   ID:  Eric Mayer, DOB December 05, 1940, MRN [id number]  PCP:  Irven Shelling, MD  Cardiologist:   [name], MD   Chief Complaint  Patient presents with  . Follow-up    2 Months; sob when exerting self. tingling in both feet. edema in legs      History of Present Illness: Eric Mayer is a 75 y.o. male with HTN, paroxysmal atrial fibrillation, chronic diastolic heart failure, and prostate adenocarcinoma here for follow up.  Eric Mayer initially presented 03/2015 with dyspnea on exertion and chronic lower extremity edema.  He had a dobutamine stress echo 03/27/15 that was negative for ischemia.  He had an echo 06/2015 that revealed LVEF 60-65% with mild aortic regurgitation and mitral regurgitation.  They were unable to evaluate diastolic function.  He was admitted 07/2015 for acute on chronic diastolic heart failure and diuresed with IV lasix. He also underwent DCCV.  He was started on a 1.5g sodium diet.  Overall Eric Mayer has been doing well.  He continues to note shortness of breath when he has a salty meal.  He takes extra lasix when his weight is up by more than 2 lb.  His weight has been typically between 273-275.  He is also enrolled in a heart failure program through [facility] where he calls with his weights daily.  He has been working on his diet by eating more fruits and vegetables.  He is not exercising and reports burning pain in bilateral feet that is worse at night.  He does not have pain with ambulation.  The pain lasts for days at a time and makes it hard for him to sleep.  He has some shortness of breath with ambulation but denies chest pain, orthopnea or PND.  He hasn't noted any episodes of atrial fibrillation.   Past Medical History:  Diagnosis Date  . Anemia 1980s X 1  . Arthritis    "hands, knees, hips, ankles" (07/31/2015)  . Atrial flutter ([address]) 07/31/2015  . Chronic diastolic heart failure ([place])    Archie Endo  07/31/2015  . Depression   . History of gastric bypass   . History of peptic ulcer    1980's  . History of radiation therapy 1993   sarcoma of left groin, tx [facility]  . History of sarcoma    1993  LEFT GOIN--  S/P SURGERY, RADIATION AND CHEMO IN [place]  . Hypertension   . Hypogonadism in male   . Incomplete right bundle branch block   . 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 Nocturia   . OSA on CPAP   . PAF (paroxysmal atrial fibrillation) ([place])    prior CARDIOLOGIST-- DR Tressia Miners TURNER/   now monitored by dr Jenny Reichmann griffin  . Primary prostate adenocarcinoma ([place]) DX 07/08/14   Gleason 7,  stage T1c  . PVC's (premature ventricular contractions) 11/21/2015  . Sarcoma ([place]) ~ 1993/1994   "of groin"  . Weakness of left leg    SECONDARY TO SURGICAL NERVE INJURY OF LEFT GOIN  . Wears glasses     Past Surgical History:  Procedure Laterality Date  . CARDIAC CATHETERIZATION  08-12-2003  dr Tressia Miners turner   Normal coronary arteries, normal wall motion, no sig. abnormalities  . CARDIOVERSION N/A 08/04/2015   Procedure: CARDIOVERSION;  Surgeon: [name], MD;  Location: [facility];  Service: Cardiovascular;  Laterality: N/A;  . COLONOSCOPY  07-03-2002  . [place]  . FRACTURE SURGERY    . GROIN DISSECTION Left 1992   [place]   SARCOMA SURGERY  . INGUINAL HERNIA REPAIR Right 1950s?  . INTESTINAL BYPASS  1976   GASTRIC FOR OBESITY  . JOINT REPLACEMENT    . PATELLA FRACTURE SURGERY Left ~ 1995   "broke it twice; only had OR once"  . PROSTATE BIOPSY  06/2014  . RADIOACTIVE SEED IMPLANT N/A 10/17/2014   Procedure: RADIOACTIVE SEED IMPLANT    ;  Surgeon: Ailene Rud, MD;  Location: [facility];  Service: Urology;  Laterality: N/A;   68 SEEDS IMPLANTED   . TONSILLECTOMY  1950s  . TOTAL KNEE ARTHROPLASTY Right 12/17/2014   Procedure: TOTAL KNEE  ARTHROPLASTY;  Surgeon: Melrose Nakayama, MD;  Location: [place];  Service: Orthopedics;  Laterality: Right;  . TRANSTHORACIC ECHOCARDIOGRAM  08-08-2003   moderate LVH/  ef 55-65%/  mild MR/  moderate LAE/  trivial TR/  trivial pericardial effusion posterior to the heart  . VENTRAL HERNIA REPAIR  1977     Current Outpatient Prescriptions  Medication Sig Dispense Refill  . acetaminophen (TYLENOL) 325 MG tablet Take 2 tablets (650 mg total) by mouth every 4 (four) hours as needed for headache or mild pain.    [name] amphetamine-dextroamphetamine (ADDERALL XR) 20 MG 24 hr capsule Take 20 mg by mouth daily as needed (ADD).    [name] apixaban (ELIQUIS) 5 MG TABS tablet Take 1 tablet (5 mg total) by mouth 2 (two) times daily. 180 tablet 1  . buPROPion (WELLBUTRIN XL) 300 MG 24 hr tablet Take 300 mg by mouth every morning.    . cholecalciferol (VITAMIN D) 1000 UNITS tablet Take 1,000 Units by mouth daily.    . Coenzyme Q10 (CO Q 10 PO) Take 300 mg by mouth every evening.     . Cyanocobalamin (VITAMIN B-12 IJ) Inject 1 mL as directed every 30 (thirty) days.     [name] diltiazem (TIAZAC) 180 MG 24 hr capsule Take 1 capsule (180 mg total) by mouth every morning. 14 capsule 1  . escitalopram (LEXAPRO) 10 MG tablet Take 10 mg by mouth daily.    . furosemide (LASIX) 80 MG tablet Take 1 tablet (80 mg total) by mouth daily. May take xtra tablet daily as needed for swelling 60 tablet 6  . metoprolol (LOPRESSOR) 50 MG tablet Take 1 tablet (50 mg total) by mouth 2 (two) times daily. 180 tablet 1  . Multiple Vitamins-Minerals (PRESERVISION AREDS 2 PO) Take by mouth daily. Takes 2 daily    . phenazopyridine (PYRIDIUM) 200 MG tablet Take 1 tablet (200 mg total) by mouth 3 (three) times daily as needed for pain. 30 tablet 2  . tamsulosin (FLOMAX) 0.4 MG CAPS capsule     . valsartan (DIOVAN) 320 MG tablet Take 0.5 tablets (160 mg total) by mouth daily. 90 tablet 3   No current facility-administered medications for this visit.      Allergies:   Ace inhibitors and Xarelto [rivaroxaban]    Social History:  The patient  reports that he has never smoked. He has never used smokeless tobacco. He reports that he does not drink alcohol or use drugs.   Family History:  The patient's family history includes Atrial fibrillation in his father; Cancer in his sister; Liver disease in his mother; Renal Disease in his mother; Stroke in  his mother.    ROS:  Please see the history of present illness.   Otherwise, review of systems are positive for none.   All other systems are reviewed and negative.    PHYSICAL EXAM: VS:  BP 104/67   Pulse 69   Ht 5\' 8"  (1.727 m)   Wt 279 lb 12.8 oz (126.9 kg)   BMI 42.54 kg/m  , BMI Body mass index is 42.54 kg/m. GENERAL:  Well appearing HEENT:  Pupils equal round and reactive, fundi not visualized, oral mucosa unremarkable NECK:  JVP 1 cm above clavicle sitting upright. Waveform within normal limits.  Carotid upstroke brisk and symmetric, no bruits, no thyromegaly LYMPHATICS:  No cervical,  adenopathy LUNGS:  Clear to auscultation bilaterally CHEST:  Unremarkable HEART:  RRR.  PMI not displaced or sustained,S1 and S2 within normal limits, no S3, no S4, no clicks, no rubs, no murmurs ABD:  Flat, positive bowel sounds normal in frequency in pitch, no bruits, no rebound, no guarding, no midline pulsatile mass, no hepatomegaly, no splenomegaly EXT:  2 plus pulses throughout, no edema.  No cyanosis no clubbing SKIN:  No rashes no nodules.   NEURO:  Cranial nerves II through XII grossly intact, motor grossly intact throughout PSYCH:  Cognitively intact, oriented to person place and time  EKG:  EKG is ordered today.   04/22/16: Sinus rhythm rate 67 bpm.  RSR' in V1.  Echo 12/11/15: Study Conclusions  - Left ventricle: The cavity size was normal. There was severe   focal basal hypertrophy of the septum. Systolic function was   normal. The estimated ejection fraction was in the range of  60%   to 65%. Wall motion was normal; there were no regional wall   motion abnormalities. Features are consistent with a pseudonormal   left ventricular filling pattern, with concomitant abnormal   relaxation and increased filling pressure (grade 2 diastolic   dysfunction). - Aortic valve: Trileaflet; mildly thickened, mildly calcified   leaflets. There was trivial regurgitation. - Aorta: Aortic root dimension: 41 mm (ED). - Ascending aorta: The ascending aorta was mildly dilated. - Left atrium: The atrium was moderately dilated.   Anterior-posterior dimension: 55 mm. Volume/bsa, ES, (1-plane   Simpson&'s, A2C): 40.2 ml/m^2.  Echo 07/08/15: Study Conclusions  - Left ventricle: The cavity size was normal. Wall thickness was increased in a pattern of moderate LVH. Systolic function was normal. The estimated ejection fraction was in the range of 60% to 65%. The study is not technically sufficient to allow evaluation of LV diastolic function. - Aortic valve: Trileaflet. Sclerosis without stenosis. There was mild regurgitation. - Mitral valve: Mildly thickened leaflets . There was mild regurgitation. - Left atrium: Moderately dilated at 43 ml/m2. - Right atrium: The atrium was mildly dilated. - Tricuspid valve: There was mild regurgitation. - Pulmonary arteries: PA peak pressure: 31 mm Hg (S). - Inferior vena cava: The vessel was normal in size. The respirophasic diameter changes were in the normal range (= 50%), consistent with normal central venous pressure.  Impressions:  - LVEF 60-65%, moderate LVH, normal wall motion, aortic valve sclerosis with mild AI, MR and TR, RVSP 31 mmHg., moderate LAE, mild RAE.   Recent Labs: 07/31/2015: ALT 26; TSH 2.760 08/03/2015: Magnesium 1.5 11/21/2015: Brain Natriuretic Peptide 55.6 02/13/2016: BUN 35; Creat 1.06; Hemoglobin 11.6; Platelets 214; Potassium 5.2; Sodium 139    Lipid Panel No results found for: CHOL, TRIG,  HDL, CHOLHDL, VLDL, LDLCALC, LDLDIRECT    Wt Readings from  Last 3 Encounters:  04/22/16 279 lb 12.8 oz (126.9 kg)  02/13/16 275 lb 9.6 oz (125 kg)  11/21/15 273 lb (123.8 kg)     Other studies Reviewed: Additional studies/ records that were reviewed today include: n/a  ASSESSMENT AND PLAN:  # Atrial fibrillation/flutter: # PVCs: Mr. Frieders remains in sinus rhythm.  He is not having any ectopy at this time.  Continue metoprolol, diltiazem and Xarelto.  This patients CHA2DS2-VASc Score and unadjusted Ischemic Stroke Rate (% per year) is equal to 2.2 % stroke rate/year from a score of 2  Above score calculated as 1 point each if present [CHF, HTN, DM, Vascular=MI/PAD/Aortic Plaque, Age if 65-74, or Male]  Above score calculated as 2 points each if present [Age > 75, or Stroke/TIA/TE]  # Chronic diastolic heart failure:  Mr. Tikkanen is euvolemic at this time.  Continue lasix 80 mg daily with an additional dose if his weight increases by more than 2lb in one day.  Continue metoprolol, diltiazem and valsartan.  # Hypertension:  Blood pressure well-controlled.  Continue metoprolol, diltiazem, and valsartan.  # Morbid obesity: # Shortness of breath:  Mr. Macfadyen continues to have exertional dyspnea, though he appears to be euvolemic on exam and he is in sinus rhythm.  I suspect that this may be due to obesity and deconditioning.  We discussed the importance of weight loss and regular exercise regimen.  He expressed understanding and will start walking regularly.   Current medicines are reviewed at length with the patient today.  The patient does not have concerns regarding medicines.  The following changes have been made:  Switch dabigatran   Labs/ tests ordered today include:  No orders of the defined types were placed in this encounter.    Disposition:   FU with Dr. Oval Linsey in 3 months.   Signed, [name], MD  04/22/2016 8:21 AM    [facility]

## 2016-04-22 NOTE — Patient Instructions (Signed)
Medication Instructions:  Your physician recommends that you continue on your current medications as directed. Please refer to the Current Medication list given to you today.  Labwork: none  Testing/Procedures: none  Follow-Up: Your physician recommends that you schedule a follow-up appointment in: 3 month ov  If you need a refill on your cardiac medications before your next appointment, please call your pharmacy.  

## 2016-05-17 ENCOUNTER — Telehealth: Payer: Self-pay | Admitting: Cardiovascular Disease

## 2016-05-17 NOTE — Telephone Encounter (Signed)
New Message  Pt voiced he needs to speak with the nurse.  Pt voiced he's not SOB or having Cp.  Please f/u with pt

## 2016-05-17 NOTE — Telephone Encounter (Signed)
Spoke with patient and he has been having decreased energy with systolic blood pressure running in lower 100's  Patient did start about a week ago taking 2 Flomax at night He had been having the low energy level but has gotten worse Discussed with Claiborne Billings D and will have him decrease FLomax back to one daily and if continues decrease Valsartan to 80 mg daily Advised patient and will follow up with him on Thursday 10/5 Did offer trying to get him an appointment but he preferred trying medication change instead

## 2016-05-20 ENCOUNTER — Ambulatory Visit (INDEPENDENT_AMBULATORY_CARE_PROVIDER_SITE_OTHER): Payer: Medicare Other | Admitting: Cardiovascular Disease

## 2016-05-20 ENCOUNTER — Encounter: Payer: Self-pay | Admitting: Cardiovascular Disease

## 2016-05-20 VITALS — BP 102/56 | HR 75 | Ht 68.0 in | Wt 279.6 lb

## 2016-05-20 DIAGNOSIS — I480 Paroxysmal atrial fibrillation: Secondary | ICD-10-CM | POA: Diagnosis not present

## 2016-05-20 DIAGNOSIS — I10 Essential (primary) hypertension: Secondary | ICD-10-CM | POA: Diagnosis not present

## 2016-05-20 DIAGNOSIS — I5032 Chronic diastolic (congestive) heart failure: Secondary | ICD-10-CM | POA: Diagnosis not present

## 2016-05-20 MED ORDER — BUMETANIDE 2 MG PO TABS: ORAL_TABLET | ORAL | 5 refills | Status: DC

## 2016-05-20 MED ORDER — VALSARTAN 80 MG PO TABS: 80.0000 mg | ORAL_TABLET | Freq: Every day | ORAL | 5 refills | Status: DC

## 2016-05-20 NOTE — Patient Instructions (Addendum)
STOP FUROSEMIDE   START BUMEX 2MG  DAILY OK TO TAKE EXTRA 1/2 TABLET AS NEEDED IF WEIGHT IS UP 2 POUNDS IN 24 HOURS OR 5 POUNDS IN A WEEK  DECREASE VALSARTAN TO 80MG  DAILY   FOLLOW UP ON 06-10-2016 AT 3:30PM  CALL IF SYMPTOMS ARE NO BETTER

## 2016-05-20 NOTE — Progress Notes (Signed)
Cardiology Office Note   Date:  05/20/2016   ID:  Eric Mayer, DOB August 11, 1941, MRN [id number]  PCP:  Irven Shelling, MD  Cardiologist:   [name], MD   Chief Complaint  Patient presents with  . Follow-up    3 months--low BP  pt c/o weight fluctuation--stopped flomax; fatigue; and low BP      History of Present Illness: Eric Mayer is a 75 y.o. male with HTN, paroxysmal atrial fibrillation, chronic diastolic heart failure, and prostate adenocarcinoma here for follow up.  Eric Mayer initially presented 03/2015 with dyspnea on exertion and chronic lower extremity edema.  He had a dobutamine stress echo 03/27/15 that was negative for ischemia.  He had an echo 06/2015 that revealed LVEF 60-65% with mild aortic regurgitation and mitral regurgitation.  They were unable to evaluate diastolic function.  He was admitted 07/2015 for acute on chronic diastolic heart failure and diuresed with IV lasix. He also underwent DCCV.  He was started on a 1.5g sodium diet.  Eric Mayer has been feeling more fatigued and short of breath lately.  He notes that his blood pressure has been running very low.  It is typically in the [id number] systolic.  He checked it at the pharmacy and it was 94/46.  The monitor also wouldn't report a pulse so he wondered if he was in atrial fibrillation.  He didn't feel any palpitations at that time but has felt his heart pounding after eating at times.  This feeling of increased fatigue has been ongoing for a while but worse in the last 1.5 weeks.  He denies chest pain or lower extremity edema.  However, he notes increased abdominal distension and feels that he doesn't always get a good urinary response from lasix.  Last week his weight was up to 278 from 270 and he reported orthopnea.  He doubled his lasix and was able to get back to 270.   Past Medical History:  Diagnosis Date  . Anemia 1980s X 1  . Arthritis    "hands, knees, hips, ankles" (07/31/2015)  .  Atrial flutter ([place]) 07/31/2015  . Chronic diastolic heart failure ([place])    Archie Endo 07/31/2015  . Depression   . History of gastric bypass   . History of peptic ulcer    1980's  . History of radiation therapy 1993   sarcoma of left groin, tx at [facility]  . History of sarcoma    1993  LEFT GOIN--  S/P SURGERY, RADIATION AND CHEMO IN [place]  . Hypertension   . Hypogonadism in male   . Incomplete right bundle branch block   . 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 Nocturia   . OSA on CPAP   . PAF (paroxysmal atrial fibrillation) ([place])    prior CARDIOLOGIST-- DR Tressia Miners TURNER/   now monitored by dr Jenny Reichmann griffin  . Primary prostate adenocarcinoma ([place]) DX 07/08/14   Gleason 7,  stage T1c  . PVC's (premature ventricular contractions) 11/21/2015  . Sarcoma ([place]) ~ 1993/1994   "of groin"  . Weakness of left leg    SECONDARY TO SURGICAL NERVE INJURY OF LEFT GOIN  . Wears glasses     Past Surgical History:  Procedure Laterality Date  . CARDIAC CATHETERIZATION  08-12-2003  dr Tressia Miners turner   Normal coronary arteries, normal wall motion, no sig. abnormalities  . CARDIOVERSION N/A  08/04/2015   Procedure: CARDIOVERSION;  Surgeon: [name], MD;  Location: [facility];  Service: Cardiovascular;  Laterality: N/A;  . COLONOSCOPY  07-03-2002  . [place]  . FRACTURE SURGERY    . GROIN DISSECTION Left 1992   [place]   SARCOMA SURGERY  . INGUINAL HERNIA REPAIR Right 1950s?  . INTESTINAL BYPASS  1976   GASTRIC FOR OBESITY  . JOINT REPLACEMENT    . PATELLA FRACTURE SURGERY Left ~ 1995   "broke it twice; only had OR once"  . PROSTATE BIOPSY  06/2014  . RADIOACTIVE SEED IMPLANT N/A 10/17/2014   Procedure: RADIOACTIVE SEED IMPLANT    ;  Surgeon: Ailene Rud, MD;  Location: [facility];  Service: Urology;  Laterality: N/A;   70 SEEDS IMPLANTED   .  TONSILLECTOMY  1950s  . TOTAL KNEE ARTHROPLASTY Right 12/17/2014   Procedure: TOTAL KNEE ARTHROPLASTY;  Surgeon: Melrose Nakayama, MD;  Location: [facility];  Service: Orthopedics;  Laterality: Right;  . TRANSTHORACIC ECHOCARDIOGRAM  08-08-2003   moderate LVH/  ef 55-65%/  mild MR/  moderate LAE/  trivial TR/  trivial pericardial effusion posterior to the heart  . VENTRAL HERNIA REPAIR  1977     Current Outpatient Prescriptions  Medication Sig Dispense Refill  . acetaminophen (TYLENOL) 325 MG tablet Take 2 tablets (650 mg total) by mouth every 4 (four) hours as needed for headache or mild pain.    [name] amphetamine-dextroamphetamine (ADDERALL XR) 20 MG 24 hr capsule Take 20 mg by mouth daily as needed (ADD).    [name] apixaban (ELIQUIS) 5 MG TABS tablet Take 1 tablet (5 mg total) by mouth 2 (two) times daily. 180 tablet 1  . buPROPion (WELLBUTRIN XL) 300 MG 24 hr tablet Take 300 mg by mouth every morning.    . cholecalciferol (VITAMIN D) 1000 UNITS tablet Take 1,000 Units by mouth daily.    . Coenzyme Q10 (CO Q 10 PO) Take 300 mg by mouth every evening.     . Cyanocobalamin (VITAMIN B-12 IJ) Inject 1 mL as directed every 30 (thirty) days.     [name] diltiazem (TIAZAC) 180 MG 24 hr capsule Take 1 capsule (180 mg total) by mouth every morning. 14 capsule 1  . escitalopram (LEXAPRO) 10 MG tablet Take 10 mg by mouth daily.    . furosemide (LASIX) 80 MG tablet Take 1 tablet (80 mg total) by mouth daily. May take xtra tablet daily as needed for swelling 60 tablet 6  . metoprolol (LOPRESSOR) 50 MG tablet Take 1 tablet (50 mg total) by mouth 2 (two) times daily. 180 tablet 1  . Multiple Vitamins-Minerals (PRESERVISION AREDS 2 PO) Take 0.5 tablets by mouth 2 (two) times daily.     . valsartan (DIOVAN) 320 MG tablet Take 0.5 tablets (160 mg total) by mouth daily. 90 tablet 3   No current facility-administered medications for this visit.     Allergies:   Ace inhibitors and Xarelto [rivaroxaban]    Social History:  The  patient  reports that he has never smoked. He has never used smokeless tobacco. He reports that he does not drink alcohol or use drugs.   Family History:  The patient's family history includes Atrial fibrillation in his father; Cancer in his sister; Liver disease in his mother; Renal Disease in his mother; Stroke in his mother.    ROS:  Please see the history of present illness.   Otherwise, review of systems are positive for  none.   All other systems are reviewed and negative.    PHYSICAL EXAM: VS:  BP (!) 102/56 (BP Location: Left Arm, Patient Position: Sitting, Cuff Size: Large)   Pulse 75   Ht 5\' 8"  (1.727 m)   Wt 279 lb 9.6 oz (126.8 kg)   BMI 42.51 kg/m  , BMI Body mass index is 42.51 kg/m. GENERAL:  Well appearing HEENT:  Pupils equal round and reactive, fundi not visualized, oral mucosa unremarkable NECK:  JVP 3 cm above clavicle sitting upright. Waveform within normal limits.  Carotid upstroke brisk and symmetric, no bruits, no thyromegaly LYMPHATICS:  No cervical,  adenopathy LUNGS:  Clear to auscultation bilaterally CHEST:  Unremarkable HEART:  RRR.  PMI not displaced or sustained,S1 and S2 within normal limits, no S3, no S4, no clicks, no rubs, no murmurs ABD:  Flat, positive bowel sounds normal in frequency in pitch, no bruits, no rebound, no guarding, no midline pulsatile mass, no hepatomegaly, no splenomegaly EXT:  2 plus pulses throughout, no edema.  No cyanosis no clubbing SKIN:  No rashes no nodules.   NEURO:  Cranial nerves II through XII grossly intact, motor grossly intact throughout PSYCH:  Cognitively intact, oriented to person place and time  EKG:  EKG is ordered today.   04/22/16: Sinus rhythm rate 67 bpm.  RSR' in V1. 05/20/16: Sinus rhythm.  Rate 75 bpm.  PACs.   Echo 12/11/15: Study Conclusions  - Left ventricle: The cavity size was normal. There was severe   focal basal hypertrophy of the septum. Systolic function was   normal. The estimated ejection  fraction was in the range of 60%   to 65%. Wall motion was normal; there were no regional wall   motion abnormalities. Features are consistent with a pseudonormal   left ventricular filling pattern, with concomitant abnormal   relaxation and increased filling pressure (grade 2 diastolic   dysfunction). - Aortic valve: Trileaflet; mildly thickened, mildly calcified   leaflets. There was trivial regurgitation. - Aorta: Aortic root dimension: 41 mm (ED). - Ascending aorta: The ascending aorta was mildly dilated. - Left atrium: The atrium was moderately dilated.   Anterior-posterior dimension: 55 mm. Volume/bsa, ES, (1-plane   Simpson&'s, A2C): 40.2 ml/m^2.  Echo 07/08/15: Study Conclusions  - Left ventricle: The cavity size was normal. Wall thickness was increased in a pattern of moderate LVH. Systolic function was normal. The estimated ejection fraction was in the range of 60% to 65%. The study is not technically sufficient to allow evaluation of LV diastolic function. - Aortic valve: Trileaflet. Sclerosis without stenosis. There was mild regurgitation. - Mitral valve: Mildly thickened leaflets . There was mild regurgitation. - Left atrium: Moderately dilated at 43 ml/m2. - Right atrium: The atrium was mildly dilated. - Tricuspid valve: There was mild regurgitation. - Pulmonary arteries: PA peak pressure: 31 mm Hg (S). - Inferior vena cava: The vessel was normal in size. The respirophasic diameter changes were in the normal range (= 50%), consistent with normal central venous pressure.  Impressions:  - LVEF 60-65%, moderate LVH, normal wall motion, aortic valve sclerosis with mild AI, MR and TR, RVSP 31 mmHg., moderate LAE, mild RAE.   Recent Labs: 07/31/2015: ALT 26; TSH 2.760 08/03/2015: Magnesium 1.5 11/21/2015: Brain Natriuretic Peptide 55.6 02/13/2016: BUN 35; Creat 1.06; Hemoglobin 11.6; Platelets 214; Potassium 5.2; Sodium 139    Lipid Panel No  results found for: CHOL, TRIG, HDL, CHOLHDL, VLDL, LDLCALC, LDLDIRECT    Wt Readings from Last 3  Encounters:  05/20/16 279 lb 9.6 oz (126.8 kg)  04/22/16 279 lb 12.8 oz (126.9 kg)  02/13/16 275 lb 9.6 oz (125 kg)     Other studies Reviewed: Additional studies/ records that were reviewed today include: n/a  ASSESSMENT AND PLAN:  # Hypertension:  Blood pressure is low and he has reported fatigue.  We will reduce valsartan to 80 mg daily.  Continue metoprolol and diltiazem.  # Chronic diastolic heart failure:  Mr. Bub continues to struggle with volume.  It seems that he has abdominal distension rather than lower extremity edema and may not be absorbing lasix well.  We will switch to bumex 2mg  daily.  Continue metoprolol and diltiazem.  Reduce valsartan as above.      # Atrial fibrillation/flutter: # PVCs: Mr. Safrit remains in sinus rhythm.  He is not having any ectopy at this time.  Continue metoprolol, diltiazem and Xarelto.    This patients CHA2DS2-VASc Score and unadjusted Ischemic Stroke Rate (% per year) is equal to 2.2 % stroke rate/year from a score of 2  Above score calculated as 1 point each if present [CHF, HTN, DM, Vascular=MI/PAD/Aortic Plaque, Age if 65-74, or Male]  Above score calculated as 2 points each if present [Age > 75, or Stroke/TIA/TE]   Current medicines are reviewed at length with the patient today.  The patient does not have concerns regarding medicines.  The following changes have been made:  Switch lasix to bumex.  Reduce valsartan to 80 mg daily.    Labs/ tests ordered today include:  No orders of the defined types were placed in this encounter.    Disposition:   FU with Dr. Oval Linsey in 2 weeks   Signed, [name], MD  05/20/2016 10:05 AM    [place]

## 2016-05-20 NOTE — Telephone Encounter (Signed)
Patient seen in office today by Dr [place]  

## 2016-05-20 NOTE — Telephone Encounter (Signed)
Left msg for patient to call. 

## 2016-05-31 ENCOUNTER — Encounter: Payer: Self-pay | Admitting: Student

## 2016-05-31 ENCOUNTER — Ambulatory Visit (INDEPENDENT_AMBULATORY_CARE_PROVIDER_SITE_OTHER): Payer: Medicare Other | Admitting: Student

## 2016-05-31 ENCOUNTER — Telehealth: Payer: Self-pay | Admitting: Cardiovascular Disease

## 2016-05-31 VITALS — Ht 68.0 in | Wt 278.8 lb

## 2016-05-31 DIAGNOSIS — I4892 Unspecified atrial flutter: Secondary | ICD-10-CM

## 2016-05-31 DIAGNOSIS — R42 Dizziness and giddiness: Secondary | ICD-10-CM

## 2016-05-31 DIAGNOSIS — I10 Essential (primary) hypertension: Secondary | ICD-10-CM

## 2016-05-31 DIAGNOSIS — I5032 Chronic diastolic (congestive) heart failure: Secondary | ICD-10-CM

## 2016-05-31 NOTE — Progress Notes (Signed)
Cardiology Office Note    Date:  05/31/2016   ID:  Eric Mayer, DOB 06-20-1941, MRN [id number]  PCP:  Irven Shelling, MD  Cardiologist:  Dr. Oval Linsey   Chief Complaint  Patient presents with  . Dizziness    History of Present Illness:    Eric Mayer is a 75 y.o. male with past medical history of chronic diastolic CHF (EF 123456 by echo in 11/2015) , paroxysmal atrial flutter (on Eliquis), and HTN who presents to the office today for evaluation of dizziness.   Was last seen by Dr. Oval Linsey on 05/20/2016 for worsening fatigue and episodes of hypotension. Reported having systolic readings in the [id number] to low-100's. Valsartan dosing was decreased from 160mg  daily to 80mg  daily. He noted worsening abdominal distension and it was thought he may not be absorbing Lasix well.He was switched from Lasix 80mg  daily to Bumex 2mg  daily. Weight at the time of his office visit was 279 lbs.   Today, he reports episodes of positional dizziness starting on Saturday (05/29/2016). Says the dizziness is worse if he turns his head from side to side, not necessarily if he is changing positions and going from sitting to standing. Feels like the room is spinning when this occurs. Dizziness resolves upon him being still and not moving his head. Denies any associated presyncope. He has checked his blood pressure at home, and denies any recent changes. Says his systolic blood pressure is usually in the low 100's and was stable at this value prior to onset of his dizziness. No chest discomfort or palpitations.  Says his weight has been stable between 277 and 279 pounds. Reports good urine output with Bumex. Has been taking one tablet in the morning and a half tablet in the afternoon if needed for weight gain. Reports good urine output with this and says his abdominal distention has improved.   Past Medical History:  Diagnosis Date  . Anemia 1980s X 1  . Arthritis    "hands, knees, hips, ankles"  (07/31/2015)  . Atrial flutter (Eric Mayer) 07/31/2015  . Chronic diastolic heart failure (Eric Mayer)    a. 11/2015: Echo w/ EF of 60-65%, no WMA, Grade 2 DD, trivial AR, ascending aorta mildly dilated.   . Depression   . History of cardiovascular stress test    a. 03/2015: Dobutamine Stress Echo with no evidence of ischemia.   [name] History of gastric bypass   . History of peptic ulcer    1980's  . History of radiation therapy 1993   sarcoma of left groin, tx at [facility]  . History of sarcoma    1993  LEFT GOIN--  S/P SURGERY, RADIATION AND CHEMO IN [place]  . Hypertension   . Hypogonadism in male   . Incomplete right bundle branch block   . 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 Nocturia   . OSA on CPAP   . PAF (paroxysmal atrial fibrillation) (Eric Mayer)    prior CARDIOLOGIST-- DR Tressia Miners TURNER/   now monitored by dr Jenny Reichmann griffin  . Primary prostate adenocarcinoma (Eric Mayer) DX 07/08/14   Gleason 7,  stage T1c  . PVC's (premature ventricular contractions) 11/21/2015  . Sarcoma (Eric Mayer) ~ 1993/1994   "of groin"  . Weakness of left leg    SECONDARY TO SURGICAL NERVE INJURY OF LEFT GOIN  . Wears glasses     Past Surgical History:  Procedure  Laterality Date  . CARDIAC CATHETERIZATION  08-12-2003  dr Tressia Miners turner   Normal coronary arteries, normal wall motion, no sig. abnormalities  . CARDIOVERSION N/A 08/04/2015   Procedure: CARDIOVERSION;  Surgeon: [name], MD;  Location: [facility];  Service: Cardiovascular;  Laterality: N/A;  . COLONOSCOPY  07-03-2002  . [place]  . FRACTURE SURGERY    . GROIN DISSECTION Left 1992   [place]   SARCOMA SURGERY  . INGUINAL HERNIA REPAIR Right 1950s?  . INTESTINAL BYPASS  1976   GASTRIC FOR OBESITY  . JOINT REPLACEMENT    . PATELLA FRACTURE SURGERY Left ~ 1995   "broke it twice; only had OR once"  . PROSTATE BIOPSY  06/2014  . RADIOACTIVE SEED  IMPLANT N/A 10/17/2014   Procedure: RADIOACTIVE SEED IMPLANT    ;  Surgeon: Ailene Rud, MD;  Location: [facility];  Service: Urology;  Laterality: N/A;   70 SEEDS IMPLANTED   . TONSILLECTOMY  1950s  . TOTAL KNEE ARTHROPLASTY Right 12/17/2014   Procedure: TOTAL KNEE ARTHROPLASTY;  Surgeon: Melrose Nakayama, MD;  Location: [facility];  Service: Orthopedics;  Laterality: Right;  . TRANSTHORACIC ECHOCARDIOGRAM  08-08-2003   moderate LVH/  ef 55-65%/  mild MR/  moderate LAE/  trivial TR/  trivial pericardial effusion posterior to the heart  . VENTRAL HERNIA REPAIR  1977    Current Medications: Outpatient Medications Prior to Visit  Medication Sig Dispense Refill  . acetaminophen (TYLENOL) 325 MG tablet Take 2 tablets (650 mg total) by mouth every 4 (four) hours as needed for headache or mild pain.    [name] amphetamine-dextroamphetamine (ADDERALL XR) 20 MG 24 hr capsule Take 20 mg by mouth daily as needed (ADD).    [name] apixaban (ELIQUIS) 5 MG TABS tablet Take 1 tablet (5 mg total) by mouth 2 (two) times daily. 180 tablet 1  . bumetanide (BUMEX) 2 MG tablet ONE TABLET BY MOUTH DAILY AND 1/2 TABLET FOR WEIGHT GAIN 2 LB DAILY OR 5 LB WEEKLY 45 tablet 5  . buPROPion (WELLBUTRIN XL) 300 MG 24 hr tablet Take 300 mg by mouth every morning.    . cholecalciferol (VITAMIN D) 1000 UNITS tablet Take 1,000 Units by mouth daily.    . Coenzyme Q10 (CO Q 10 PO) Take 300 mg by mouth every evening.     . Cyanocobalamin (VITAMIN B-12 IJ) Inject 1 mL as directed every 30 (thirty) days.     [name] diltiazem (TIAZAC) 180 MG 24 hr capsule Take 1 capsule (180 mg total) by mouth every morning. 14 capsule 1  . escitalopram (LEXAPRO) 10 MG tablet Take 10 mg by mouth daily.    . metoprolol (LOPRESSOR) 50 MG tablet Take 1 tablet (50 mg total) by mouth 2 (two) times daily. 180 tablet 1  . Multiple Vitamins-Minerals (PRESERVISION AREDS 2 PO) Take 0.5 tablets by mouth 2 (two) times daily.     . valsartan (DIOVAN) 80 MG  tablet Take 1 tablet (80 mg total) by mouth daily. 30 tablet 5   No facility-administered medications prior to visit.      Allergies:   Ace inhibitors and Xarelto [rivaroxaban]   Social History   Social History  . Marital status: Married    Spouse name: N/A  . Number of children: N/A  . Years of education: N/A   Social History Main Topics  . Smoking status: Never Smoker  . Smokeless tobacco: Never Used  . Alcohol use No  .  Drug use: No  . Sexual activity: Not Currently   Other Topics Concern  . None   Social History Narrative  . None     Family History:  The patient's family history includes Atrial fibrillation in his father; Cancer in his sister; Liver disease in his mother; Renal Disease in his mother; Stroke in his mother.   Review of Systems:   Please see the history of present illness.     General:  No chills, fever, night sweats or weight changes.  Cardiovascular:  No chest pain, dyspnea on exertion, orthopnea, palpitations, paroxysmal nocturnal dyspnea. Positive for chronic lower extremity edema.   Dermatological: No rash, lesions/masses Respiratory: No cough, dyspnea Urologic: No hematuria, dysuria Abdominal:   No nausea, vomiting, diarrhea, bright red blood per rectum, melena, or hematemesis Neurologic:  No visual changes, wkns, changes in mental status. Positive for positional dizziness. All other systems reviewed and are otherwise negative except as noted above.   Physical Exam:    VS:  Ht 5\' 8"  (1.727 m)   Wt 278 lb 12.8 oz (126.5 kg)   BMI 42.39 kg/m    General: Pleasant Obese Caucasian male appearing in no acute distress. Head: Normocephalic, atraumatic, sclera non-icteric, no xanthomas, nares are without discharge.  Neck: No carotid bruits. JVD not elevated.  Lungs: Respirations regular and unlabored, without wheezes or rales.  Heart: Regular rate and rhythm. No S3 or S4.  No murmur, no rubs, or gallops appreciated. Abdomen: Soft, non-tender,  non-distended with normoactive bowel sounds. No hepatomegaly. No rebound/guarding. No obvious abdominal masses. Msk:  Strength and tone appear normal for age. No joint deformities or effusions. Extremities: No clubbing or cyanosis. No edema on right lower extremity, 1+ pitting edema along left lower extremity.  Distal pedal pulses are 2+ bilaterally. Neuro: Alert and oriented X 3. Moves all extremities spontaneously. No focal deficits noted. Psych:  Responds to questions appropriately with a normal affect. Skin: No rashes or lesions noted  Wt Readings from Last 3 Encounters:  05/31/16 278 lb 12.8 oz (126.5 kg)  05/20/16 279 lb 9.6 oz (126.8 kg)  04/22/16 279 lb 12.8 oz (126.9 kg)      Studies/Labs Reviewed:   EKG:  EKG is not ordered today.    Recent Labs: 07/31/2015: ALT 26; TSH 2.760 08/03/2015: Magnesium 1.5 11/21/2015: Brain Natriuretic Peptide 55.6 02/13/2016: BUN 35; Creat 1.06; Hemoglobin 11.6; Platelets 214; Potassium 5.2; Sodium 139   Lipid Panel No results found for: CHOL, TRIG, HDL, CHOLHDL, VLDL, LDLCALC, LDLDIRECT  Additional studies/ records that were reviewed today include:   Echo: 11/2015 Study Conclusions  - Left ventricle: The cavity size was normal. There was severe   focal basal hypertrophy of the septum. Systolic function was   normal. The estimated ejection fraction was in the range of 60%   to 65%. Wall motion was normal; there were no regional wall   motion abnormalities. Features are consistent with a pseudonormal   left ventricular filling pattern, with concomitant abnormal   relaxation and increased filling pressure (grade 2 diastolic   dysfunction). - Aortic valve: Trileaflet; mildly thickened, mildly calcified   leaflets. There was trivial regurgitation. - Aorta: Aortic root dimension: 41 mm (ED). - Ascending aorta: The ascending aorta was mildly dilated. - Left atrium: The atrium was moderately dilated.   Anterior-posterior dimension: 55 mm.  Volume/bsa, ES, (1-plane   Simpson&'s, A2C): 40.2 ml/m^2.   Assessment:    1. Vertigo   2. Chronic diastolic CHF (congestive heart failure) ([place])  3. Atrial flutter, paroxysmal ([facility])   4. Essential hypertension      Plan:   In order of problems listed above:  1. New Onset Vertigo - reports episodes of positional dizziness starting on Saturday (05/29/2016). Notes these episodes when turning his head from side to side, not necessarily if he is changing positions and going from sitting to standing. Dizziness resolves upon him being still and not moving his head. No associated presyncope. SBP has been in the low-100's which is at baseline according to the patient. No changes in dizzy spells following taking his medications. - Orthostatic Vitals checked today: Lying: BP: 107/59 P: 56 Sitting: BP: 99/60 P: 60 Standing: 110/66 P: 68 - overall, his episodes of dizziness seem most consistent with Benign Paroxysmal Positional Vertigo (BPPV). Reviewed importance of changing positions slowly. No tinnitus, making Meniere's Disease less likely. Recommended he follow-up with his PCP, as he may be able to perform Dix-Hallpike and possible Epley maneuvers if symptoms persist or refer him to ENT. Patient voiced understanding of this. Was given information handout in regards to BPPV and conservative measures which can be taken to assist with symptoms.   2. Chronic Diastolic CHF - EF 123456 by echo in 11/2015. Reports abdominal distension has improved. Weight variable between 277 lbs and 279 lbs on home scales.  - Continue Bumex 2 mg once daily with additional half tablet for weight gain of 2 pounds overnight or 5 pounds weekly.  3. Paroxysmal Atrial Flutter - This patients CHA2DS2-VASc Score and unadjusted Ischemic Stroke Rate (% per year) is equal to 3.2 % stroke rate/year from a score of 3 (CHF, HTN, Age). Denies any evidence of active bleeding. Continue Eliquis 5mg  BID. - Continue Lopressor and  Cardizem CD.   4. HTN - BP well-controlled. Occasional episodes of hypotension. - Continue Lopressor 50mg  BID, Cardizem CD 180 mg daily, and recently reduced Diovan dosing of 80 mg daily.   Medication Adjustments/Labs and Tests Ordered: Current medicines are reviewed at length with the patient today.  Concerns regarding medicines are outlined above.  Medication changes, Labs and Tests ordered today are listed in the Patient Instructions below. Patient Instructions  Your physician recommends that you schedule a follow-up appointment in: AS SCHEDULED     Signed, Erma Heritage, [place]  05/31/2016 4:36 PM    [facility] [address], [place], [place]  [address] Phone: [phone]; Fax: [phone]  [address] [place], [place] [address] Phone: [phone]

## 2016-05-31 NOTE — Patient Instructions (Signed)
Your physician recommends that you schedule a follow-up appointment in: AS SCHEDULED  

## 2016-05-31 NOTE — Telephone Encounter (Signed)
New message    Pt verbalized that he is having dizziness from the medication changes   Pt c/o medication issue:  1. Name of Medication: bumetanide 2mg  1xday & diovan 80mg  1xday  2. How are you currently taking this medication (dosage and times per day)?    3. Are you having a reaction (difficulty breathing--STAT)? no  4. What is your medication issue? Pt is too dizzy yo walk by himself  Saturday afternoon is when he started feeling dizzy

## 2016-05-31 NOTE — Telephone Encounter (Signed)
Pt experiencing continued dizziness. Notes he had this preceding BP med changes. He was seen 10/5 by Dr. Oval Linsey and had some meds discontinued and reduced to address his low BP issues. He reports BPs better, should feel that the dizziness has improved, but has not. He notes symptoms are very noticeable when he turns head from side to side.  BPs this AM 120s/50s. HR in 50s-low 60s. Spoke w Hilda Blades who will add to [place] PA schedule this afternoon for 1:30pm. Pt aware of/acknowledged appt time and voiced thanks for call.

## 2016-06-10 ENCOUNTER — Ambulatory Visit: Payer: Medicare Other | Admitting: Cardiovascular Disease

## 2016-06-11 ENCOUNTER — Encounter: Payer: Self-pay | Admitting: *Deleted

## 2016-06-21 ENCOUNTER — Encounter: Payer: Self-pay | Admitting: Cardiovascular Disease

## 2016-06-21 ENCOUNTER — Ambulatory Visit (INDEPENDENT_AMBULATORY_CARE_PROVIDER_SITE_OTHER): Payer: Medicare Other | Admitting: Cardiovascular Disease

## 2016-06-21 VITALS — BP 122/73 | HR 79 | Ht 68.5 in | Wt 280.0 lb

## 2016-06-21 DIAGNOSIS — Z79899 Other long term (current) drug therapy: Secondary | ICD-10-CM

## 2016-06-21 DIAGNOSIS — I4892 Unspecified atrial flutter: Secondary | ICD-10-CM | POA: Diagnosis not present

## 2016-06-21 DIAGNOSIS — I5032 Chronic diastolic (congestive) heart failure: Secondary | ICD-10-CM | POA: Diagnosis not present

## 2016-06-21 DIAGNOSIS — I10 Essential (primary) hypertension: Secondary | ICD-10-CM | POA: Diagnosis not present

## 2016-06-21 MED ORDER — VALSARTAN 80 MG PO TABS: 80.0000 mg | ORAL_TABLET | Freq: Every day | ORAL | 3 refills | Status: DC

## 2016-06-21 MED ORDER — BUMETANIDE 2 MG PO TABS: ORAL_TABLET | ORAL | 3 refills | Status: DC

## 2016-06-21 NOTE — Patient Instructions (Addendum)
Medication Instructions:  Your physician recommends that you continue on your current medications as directed. Please refer to the Current Medication list given to you today.  Labwork: BMET/MAGNESIUM AT [facility] ON THE FIRST FLOOR  Testing/Procedures: NONE  Follow-Up: Your physician recommends that you schedule a follow-up appointment in: 3 MONTH OV  If you need a refill on your cardiac medications before your next appointment, please call your pharmacy.

## 2016-06-21 NOTE — Progress Notes (Signed)
Cardiology Office Note   Date:  06/21/2016   ID:  Eric Mayer, DOB 05/16/1941, MRN [id number]  PCP:  Eric Shelling, MD  Cardiologist:   Eric Latch, MD   Chief Complaint  Patient presents with  . Follow-up  . Edema    left leg      History of Present Illness: Eric Mayer is a 75 y.o. male with HTN, paroxysmal atrial fibrillation, chronic diastolic heart failure, and prostate adenocarcinoma here for follow up.  [name] initially presented 03/2015 with dyspnea on exertion and chronic lower extremity edema.  He had a dobutamine stress echo 03/27/15 that was negative for ischemia.  He had an echo 06/2015 that revealed LVEF 60-65% with mild aortic regurgitation and mitral regurgitation.  They were unable to evaluate diastolic function.  He was admitted 07/2015 for acute on chronic diastolic heart failure and diuresed with IV lasix. He also underwent DCCV.  He was started on a 1.5g sodium diet.  Eric Mayer was seen in clinic 04/2016 and reported fatigue, shortness of breath, weight gain, and low blood pressures.  At that appointment he was switched from Lasix to Bumex and his valsartan was reduced. He continued to have low blood pressures and saw Elizebeth Mayer on 05/31/16.  He reported dizziness and was found to have vertigo. He was very mildly orthostatic. Since that appointment he has been doing well.  The vertigo resolved on its own.  He notes that his weight has been erratic. It fluctuates wildly with changes in his diet. After eating fried chicken or [place] food his weight goes up nearly 6 pounds in one day. He is quite responsive to the Bumex and his weight goes down 3-4 pounds in one day if he takes the Bumex twice daily. He notes that his energy levels have improved. His breathing has been stable and he denies lower extremity edema, orthopnea, or PND.   Past Medical History:  Diagnosis Date  . Anemia 1980s X 1  . Arthritis    "hands, knees, hips, ankles"  (07/31/2015)  . Atrial flutter ([place]) 07/31/2015  . Chronic diastolic heart failure ([place])    a. 11/2015: Echo w/ EF of 60-65%, no WMA, Grade 2 DD, trivial AR, ascending aorta mildly dilated.   . Depression   . History of cardiovascular stress test    a. 03/2015: Dobutamine Stress Echo with no evidence of ischemia.   Eric Mayer History of gastric bypass   . History of peptic ulcer    1980's  . History of radiation therapy 1993   sarcoma of left groin, tx at [facility]  . History of sarcoma    1993  LEFT GOIN--  S/P SURGERY, RADIATION AND CHEMO IN [place]  . Hypertension   . Hypogonadism in male   . Incomplete right bundle branch block   . 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 Nocturia   . OSA on CPAP   . PAF (paroxysmal atrial fibrillation) ([place])    prior CARDIOLOGIST-- DR Eric Mayer/   now monitored by dr Eric Mayer  . Primary prostate adenocarcinoma ([place]) DX 07/08/14   Gleason 7,  stage T1c  . PVC's (premature ventricular contractions) 11/21/2015  . Sarcoma ([place]) ~ 1993/1994   "of groin"  . Weakness of left leg    SECONDARY TO SURGICAL NERVE INJURY OF LEFT GOIN  . Wears glasses  Past Surgical History:  Procedure Laterality Date  . CARDIAC CATHETERIZATION  08-12-2003  dr Eric Mayer   Normal coronary arteries, normal wall motion, no sig. abnormalities  . CARDIOVERSION N/A 08/04/2015   Procedure: CARDIOVERSION;  Surgeon: Eric Latch, MD;  Location: [facility];  Service: Cardiovascular;  Laterality: N/A;  . COLONOSCOPY  07-03-2002  . [place]  . FRACTURE SURGERY    . GROIN DISSECTION Left 1992   [place]   SARCOMA SURGERY  . INGUINAL HERNIA REPAIR Right 1950s?  . INTESTINAL BYPASS  1976   GASTRIC FOR OBESITY  . JOINT REPLACEMENT    . PATELLA FRACTURE SURGERY Left ~ 1995   "broke it twice; only had OR once"  . PROSTATE BIOPSY  06/2014  . RADIOACTIVE SEED  IMPLANT N/A 10/17/2014   Procedure: RADIOACTIVE SEED IMPLANT    ;  Surgeon: Eric Rud, MD;  Location[facility];  Service: Urology;  Laterality: N/A;   70 SEEDS IMPLANTED   . TONSILLECTOMY  1950s  . TOTAL KNEE ARTHROPLASTY Right 12/17/2014   Procedure: TOTAL KNEE ARTHROPLASTY;  Surgeon: Eric Nakayama, MD;  Location: [place] [facility];  Service: Orthopedics;  Laterality: Right;  . TRANSTHORACIC ECHOCARDIOGRAM  08-08-2003   moderate LVH/  ef 55-65%/  mild MR/  moderate LAE/  trivial TR/  trivial pericardial effusion posterior to the heart  . VENTRAL HERNIA REPAIR  1977     Current Outpatient Prescriptions  Medication Sig Dispense Refill  . acetaminophen (TYLENOL) 325 MG tablet Take 2 tablets (650 mg total) by mouth every 4 (four) hours as needed for headache or mild pain.    Eric Mayer amphetamine-dextroamphetamine (ADDERALL XR) 20 MG 24 hr capsule Take 20 mg by mouth daily as needed (ADD).    Eric Mayer apixaban (ELIQUIS) 5 MG TABS tablet Take 1 tablet (5 mg total) by mouth 2 (two) times daily. 180 tablet 1  . bumetanide (BUMEX) 2 MG tablet ONE TABLET BY MOUTH DAILY AND AN EXTRA TABLET FOR WEIGHT GAIN 2 LB DAILY OR 5 LB WEEKLY 180 tablet 3  . buPROPion (WELLBUTRIN XL) 300 MG 24 hr tablet Take 300 mg by mouth every morning.    . cholecalciferol (VITAMIN D) 1000 UNITS tablet Take 1,000 Units by mouth daily.    . Coenzyme Q10 (CO Q 10 PO) Take 300 mg by mouth every evening.     . Cyanocobalamin (VITAMIN B-12 IJ) Inject 1 mL as directed every 30 (thirty) days.     Eric Mayer diltiazem (TIAZAC) 180 MG 24 hr capsule Take 1 capsule (180 mg total) by mouth every morning. 14 capsule 1  . escitalopram (LEXAPRO) 10 MG tablet Take 10 mg by mouth daily.    . metoprolol (LOPRESSOR) 50 MG tablet Take 1 tablet (50 mg total) by mouth 2 (two) times daily. 180 tablet 1  . Multiple Vitamins-Minerals (PRESERVISION AREDS 2 PO) Take 0.5 tablets by mouth 2 (two) times daily.     . valsartan (DIOVAN) 80 MG tablet Take 1 tablet  (80 mg total) by mouth daily. 90 tablet 3   No current facility-administered medications for this visit.     Allergies:   Ace inhibitors and Xarelto [rivaroxaban]    Social History:  The patient  reports that he has never smoked. He has never used smokeless tobacco. He reports that he does not drink alcohol or use drugs.   Family History:  The patient's family history includes Atrial fibrillation in his father; Cancer in his sister; Liver disease  in his mother; Renal Disease in his mother; Stroke in his mother.    ROS:  Please see the history of present illness.   Otherwise, review of systems are positive for none.   All other systems are reviewed and negative.    PHYSICAL EXAM: VS:  BP 122/73   Pulse 79   Ht 5' 8.5" (1.74 m)   Wt 127 kg (280 lb)   BMI 41.95 kg/m  , BMI Body mass index is 41.95 kg/m. GENERAL:  Well appearing HEENT:  Pupils equal round and reactive, fundi not visualized, oral mucosa unremarkable NECK:  No JVD sitting upright.  Waveform within normal limits.  Carotid upstroke brisk and symmetric, no bruits LYMPHATICS:  No cervical,  adenopathy LUNGS:  Clear to auscultation bilaterally CHEST:  Unremarkable HEART:  RRR.  PMI not displaced or sustained,S1 and S2 within normal limits, no S3, no S4, no clicks, no rubs, no murmurs ABD:  Flat, positive bowel sounds normal in frequency in pitch, no bruits, no rebound, no guarding, no midline pulsatile mass, no hepatomegaly, no splenomegaly EXT:  2 plus pulses throughout, no edema.  No cyanosis no clubbing SKIN:  No rashes no nodules.   NEURO:  Cranial nerves II through XII grossly intact, motor grossly intact throughout PSYCH:  Cognitively intact, oriented to person place and time  EKG:  EKG is not ordered today.   04/22/16: Sinus rhythm rate 67 bpm.  RSR' in V1. 05/20/16: Sinus rhythm.  Rate 75 bpm.  PACs.   Echo 12/11/15: Study Conclusions  - Left ventricle: The cavity size was normal. There was severe   focal basal  hypertrophy of the septum. Systolic function was   normal. The estimated ejection fraction was in the range of 60%   to 65%. Wall motion was normal; there were no regional wall   motion abnormalities. Features are consistent with a pseudonormal   left ventricular filling pattern, with concomitant abnormal   relaxation and increased filling pressure (grade 2 diastolic   dysfunction). - Aortic valve: Trileaflet; mildly thickened, mildly calcified   leaflets. There was trivial regurgitation. - Aorta: Aortic root dimension: 41 mm (ED). - Ascending aorta: The ascending aorta was mildly dilated. - Left atrium: The atrium was moderately dilated.   Anterior-posterior dimension: 55 mm. Volume/bsa, ES, (1-plane   Simpson&'s, A2C): 40.2 ml/m^2.  Echo 07/08/15: Study Conclusions  - Left ventricle: The cavity size was normal. Wall thickness was increased in a pattern of moderate LVH. Systolic function was normal. The estimated ejection fraction was in the range of 60% to 65%. The study is not technically sufficient to allow evaluation of LV diastolic function. - Aortic valve: Trileaflet. Sclerosis without stenosis. There was mild regurgitation. - Mitral valve: Mildly thickened leaflets . There was mild regurgitation. - Left atrium: Moderately dilated at 43 ml/m2. - Right atrium: The atrium was mildly dilated. - Tricuspid valve: There was mild regurgitation. - Pulmonary arteries: PA peak pressure: 31 mm Hg (S). - Inferior vena cava: The vessel was normal in size. The respirophasic diameter changes were in the normal range (= 50%), consistent with normal central venous pressure.  Impressions:  - LVEF 60-65%, moderate LVH, normal wall motion, aortic valve sclerosis with mild AI, MR and TR, RVSP 31 mmHg., moderate LAE, mild RAE.   Recent Labs: 07/31/2015: ALT 26; TSH 2.760 08/03/2015: Magnesium 1.5 11/21/2015: Brain Natriuretic Peptide 55.6 02/13/2016: BUN 35; Creat  1.06; Hemoglobin 11.6; Platelets 214; Potassium 5.2; Sodium 139    Lipid Panel No results  found for: CHOL, TRIG, HDL, CHOLHDL, VLDL, LDLCALC, LDLDIRECT    Wt Readings from Last 3 Encounters:  06/21/16 127 kg (280 lb)  05/31/16 126.5 kg (278 lb 12.8 oz)  05/20/16 126.8 kg (279 lb 9.6 oz)     Other studies Reviewed: Additional studies/ records that were reviewed today include: n/a  ASSESSMENT AND PLAN:  # Hypertension:  Blood pressure is Better since reducing valsartan and and he has more energy. Continue losartan, metoprolol, and diltiazem.   # Chronic diastolic heart failure:  Mr. Lankford continues to struggle with volume. He responds nicely to Bumex. Continue 2 mg daily with an additional 2 mg of his weight increases by 2 pounds in one day or 5 pounds in one week. Continue blood pressure management as above.  # Atrial fibrillation/flutter: # PVCs: Mr. Cam remains in sinus rhythm.  He is not having any ectopy at this time.  Continue metoprolol, diltiazem and Xarelto.    This patients CHA2DS2-VASc Score and unadjusted Ischemic Stroke Rate (% per year) is equal to 2.2 % stroke rate/year from a score of 2  Above score calculated as 1 point each if present [CHF, HTN, DM, Vascular=MI/PAD/Aortic Plaque, Age if 65-74, or Male]  Above score calculated as 2 points each if present [Age > 75, or Stroke/TIA/TE]   Current medicines are reviewed at length with the patient today.  The patient does not have concerns regarding medicines.  The following changes have been made:  None  Labs/ tests ordered today include:  Orders Placed This Encounter  Procedures  . Magnesium  . Basic metabolic panel     Disposition:   FU with Dr. Oval Linsey in 3 months   Signed, Eric Latch, MD  06/21/2016 1:28 PM    [place]

## 2016-06-22 LAB — BASIC METABOLIC PANEL
BUN: 35 mg/dL — ABNORMAL HIGH (ref 7–25)
CO2: 24 mmol/L (ref 20–31)
Calcium: 8.7 mg/dL (ref 8.6–10.3)
Chloride: 109 mmol/L (ref 98–110)
Creat: 1.09 mg/dL (ref 0.70–1.18)
Glucose, Bld: 98 mg/dL (ref 65–99)
Potassium: 4.4 mmol/L (ref 3.5–5.3)
Sodium: 142 mmol/L (ref 135–146)

## 2016-06-22 LAB — MAGNESIUM: Magnesium: 1.4 mg/dL — ABNORMAL LOW (ref 1.5–2.5)

## 2016-06-23 ENCOUNTER — Telehealth: Payer: Self-pay | Admitting: *Deleted

## 2016-06-23 MED ORDER — MAGNESIUM OXIDE 400 MG PO TABS: 400.0000 mg | ORAL_TABLET | Freq: Every day | ORAL | 5 refills | Status: DC

## 2016-06-23 NOTE — Telephone Encounter (Signed)
Advised patient of lab results and new Rx

## 2016-06-23 NOTE — Telephone Encounter (Signed)
-----   Message from [name], MD sent at 06/22/2016  4:20 PM EST ----- Magnesium is low. Let's start 400 mg of magnesium oxide daily.

## 2016-07-21 ENCOUNTER — Encounter: Payer: Self-pay | Admitting: Cardiology

## 2016-07-21 ENCOUNTER — Ambulatory Visit (INDEPENDENT_AMBULATORY_CARE_PROVIDER_SITE_OTHER): Payer: Medicare Other | Admitting: Cardiology

## 2016-07-21 VITALS — BP 112/60 | HR 110 | Ht 68.5 in | Wt 284.8 lb

## 2016-07-21 DIAGNOSIS — I5032 Chronic diastolic (congestive) heart failure: Secondary | ICD-10-CM | POA: Diagnosis not present

## 2016-07-21 DIAGNOSIS — I5033 Acute on chronic diastolic (congestive) heart failure: Secondary | ICD-10-CM

## 2016-07-21 DIAGNOSIS — I480 Paroxysmal atrial fibrillation: Secondary | ICD-10-CM | POA: Diagnosis not present

## 2016-07-21 DIAGNOSIS — I4892 Unspecified atrial flutter: Secondary | ICD-10-CM | POA: Diagnosis not present

## 2016-07-21 DIAGNOSIS — I10 Essential (primary) hypertension: Secondary | ICD-10-CM | POA: Diagnosis not present

## 2016-07-21 DIAGNOSIS — Z7901 Long term (current) use of anticoagulants: Secondary | ICD-10-CM

## 2016-07-21 DIAGNOSIS — E6609 Other obesity due to excess calories: Secondary | ICD-10-CM

## 2016-07-21 MED ORDER — AMIODARONE HCL 200 MG PO TABS: ORAL_TABLET | ORAL | 1 refills | Status: DC

## 2016-07-21 MED ORDER — METOPROLOL TARTRATE 25 MG PO TABS: 25.0000 mg | ORAL_TABLET | Freq: Two times a day (BID) | ORAL | 1 refills | Status: DC

## 2016-07-21 NOTE — Assessment & Plan Note (Signed)
Reports non compliance in the last week

## 2016-07-21 NOTE — Assessment & Plan Note (Signed)
BMI 42 

## 2016-07-21 NOTE — Patient Instructions (Signed)
Medication Instructions:  Your physician has recommended you make the following change in your medication:  1.  REDUCE the Lopressor to 25 mg taking 1 tablet twice a day 2.  START Amiodarone 200 mg taking 1 tablet by mouth twice a day X's 2 weeks then go down to 1 tablet daily   Labwork: None ordered  Testing/Procedures: None ordered  Follow-Up: Your physician recommends that you schedule a follow-up appointment in: [place] DR. Foreman   Any Other Special Instructions Will Be Listed Below (If Applicable).     If you need a refill on your cardiac medications before your next appointment, please call your pharmacy.

## 2016-07-21 NOTE — Assessment & Plan Note (Signed)
Eliquis 

## 2016-07-21 NOTE — Progress Notes (Signed)
07/21/2016 Eric Mayer   1941-03-19  [id number]  Primary Physician Irven Shelling, MD Primary Cardiologist: Dr Oval Linsey  HPI:  75 y.o. morbidly obese Caucasian male with past medical history of chronic diastolic CHF (EF [id number] by echo in 11/2015) , paroxysmal atrial flutter (on Eliquis),  HTN, and OSA on C-pap. He has maintained NSR since DCCV in Dec 2016. Prior cardiac evaluation remarkable for cath in '04 and negative dobutamine stress echo in Aug 2016. He is in the office today with complaints of DOE for the past 1-2 weeks. He is back in AF with VR 110.  He has noted increase in his wgt. He admits he has not been complaint with C-pap over the past week when they went out of town.    Current Outpatient Prescriptions  Medication Sig Dispense Refill  . acetaminophen (TYLENOL) 325 MG tablet Take 2 tablets (650 mg total) by mouth every 4 (four) hours as needed for headache or mild pain.    [name] amphetamine-dextroamphetamine (ADDERALL XR) 20 MG 24 hr capsule Take 20 mg by mouth daily as needed (ADD).    [name] apixaban (ELIQUIS) 5 MG TABS tablet Take 1 tablet (5 mg total) by mouth 2 (two) times daily. 180 tablet 1  . bumetanide (BUMEX) 2 MG tablet ONE TABLET BY MOUTH DAILY AND AN EXTRA TABLET FOR WEIGHT GAIN 2 LB DAILY OR 5 LB WEEKLY 180 tablet 3  . buPROPion (WELLBUTRIN XL) 300 MG 24 hr tablet Take 300 mg by mouth every morning.    . cholecalciferol (VITAMIN D) 1000 UNITS tablet Take 1,000 Units by mouth daily.    . Coenzyme Q10 (CO Q 10 PO) Take 300 mg by mouth every evening.     . Cyanocobalamin (VITAMIN B-12 IJ) Inject 1 mL as directed every 30 (thirty) days.     [name] diltiazem (TIAZAC) 180 MG 24 hr capsule Take 1 capsule (180 mg total) by mouth every morning. 14 capsule 1  . escitalopram (LEXAPRO) 10 MG tablet Take 10 mg by mouth daily.    . magnesium oxide (MAG-OX) 400 MG tablet Take 1 tablet (400 mg total) by mouth daily. 30 tablet 5  . metoprolol (LOPRESSOR) 25 MG tablet Take 1 tablet  (25 mg total) by mouth 2 (two) times daily. 60 tablet 1  . Multiple Vitamins-Minerals (PRESERVISION AREDS 2 PO) Take 0.5 tablets by mouth 2 (two) times daily.     . valsartan (DIOVAN) 80 MG tablet Take 1 tablet (80 mg total) by mouth daily. 90 tablet 3  . amiodarone (PACERONE) 200 MG tablet TAKE 1 TABLET BY MOUTH TWICE A DAY X'S 2 WEEKS THEN TAKE 1 TABLET BY MOUTH DAILY 42 tablet 1   No current facility-administered medications for this visit.     Allergies  Allergen Reactions  . Ace Inhibitors Other (See Comments)    cough  . Xarelto [Rivaroxaban] Other (See Comments)    Joint pain    Social History   Social History  . Marital status: Married    Spouse name: N/A  . Number of children: N/A  . Years of education: N/A   Occupational History  . Not on file.   Social History Main Topics  . Smoking status: Never Smoker  . Smokeless tobacco: Never Used  . Alcohol use No  . Drug use: No  . Sexual activity: Not Currently   Other Topics Concern  . Not on file   Social History Narrative  . No narrative on file  Review of Systems: General: negative for chills, fever, night sweats or weight changes.  Cardiovascular: negative for chest pain, edema, orthopnea, paroxysmal nocturnal dyspnea or shortness of breath Dermatological: negative for rash Respiratory: negative for cough or wheezing Urologic: negative for hematuria Abdominal: negative for nausea, vomiting, diarrhea, bright red blood per rectum, melena, or hematemesis Neurologic: negative for visual changes, syncope, or dizziness All other systems reviewed and are otherwise negative except as noted above.    Blood pressure 112/60, pulse (!) 110, height 5' 8.5" (1.74 m), weight 284 lb 12.8 oz (129.2 kg).  General appearance: alert, cooperative, no distress and morbidly obese Neck: no carotid bruit and no JVD Lungs: clear to auscultation bilaterally Heart: irregularly irregular rhythm Abdomen: obese, non tender, mid  line surgical scar Extremities: trace edema Skin: Skin color, texture, turgor normal. No rashes or lesions Neurologic: Grossly normal  EKG AF with VR 110  ASSESSMENT AND PLAN:   Atrial fibrillation (HCC) Recurrent PAF  Chronic anticoagulation Eliquis  Sleep apnea-on C-Pap Reports non compliance in the last week  Obesity BMI 42  Acute on chronic diastolic congestive heart failure ([place]) Secondary to AF   PLAN  Reviewed with Dr Claiborne Billings in the office today. His LA was 55 mm in April. TSH normal. Will add Amiodarone 200 mg BID for two weeks, then 200 mg daily. Lopressor decreased to 25 mg BID. F/U in 4 weeks with Dr Oval Linsey. We discussed the importance of C-pap compliance and Eliquis compliance.   [name] PA-C 07/21/2016 9:00 AM

## 2016-07-21 NOTE — Assessment & Plan Note (Signed)
Recurrent PAF

## 2016-07-21 NOTE — Assessment & Plan Note (Signed)
Secondary to AF

## 2016-07-28 ENCOUNTER — Ambulatory Visit: Payer: Medicare Other | Admitting: Cardiovascular Disease

## 2016-08-11 ENCOUNTER — Encounter: Payer: Self-pay | Admitting: Cardiovascular Disease

## 2016-08-11 ENCOUNTER — Ambulatory Visit (INDEPENDENT_AMBULATORY_CARE_PROVIDER_SITE_OTHER): Payer: Medicare Other | Admitting: Cardiovascular Disease

## 2016-08-11 VITALS — BP 122/62 | HR 73 | Ht 68.0 in | Wt 283.8 lb

## 2016-08-11 DIAGNOSIS — I5032 Chronic diastolic (congestive) heart failure: Secondary | ICD-10-CM

## 2016-08-11 DIAGNOSIS — I493 Ventricular premature depolarization: Secondary | ICD-10-CM | POA: Diagnosis not present

## 2016-08-11 DIAGNOSIS — I480 Paroxysmal atrial fibrillation: Secondary | ICD-10-CM

## 2016-08-11 DIAGNOSIS — Z79899 Other long term (current) drug therapy: Secondary | ICD-10-CM | POA: Diagnosis not present

## 2016-08-11 MED ORDER — AMIODARONE HCL 200 MG PO TABS: 200.0000 mg | ORAL_TABLET | Freq: Every day | ORAL | 6 refills | Status: DC

## 2016-08-11 NOTE — Patient Instructions (Signed)
DECREASE AMIODARONE 200 MG ONE TABLET DAILY   LABS  CMP ,TSH   Your physician has recommended that you have a pulmonary function test. Pulmonary Function Tests are a group of tests that measure how well air moves in and out of your lungs.(BASELINE WHILE TAKING AMIODARONE)    Your physician recommends that you schedule a follow-up appointment in [place]

## 2016-08-11 NOTE — Progress Notes (Signed)
Cardiology Office Note   Date:  08/11/2016   ID:  Eric Mayer, DOB 04/19/1941, MRN [id number]  PCP:  Irven Shelling, MD  Cardiologist:   [name], MD   Chief Complaint  Patient presents with  . Follow-up  . Shortness of Breath    has decrease  . Edema    in ankles     History of Present Illness: Eric Mayer is a 75 y.o. male with HTN, paroxysmal atrial fibrillation, chronic diastolic heart failure, and prostate adenocarcinoma here for follow up.  Eric Mayer initially presented 03/2015 with dyspnea on exertion and chronic lower extremity edema.  He had a dobutamine stress echo 03/27/15 that was negative for ischemia.  He had an echo 06/2015 that revealed LVEF 60-65% with mild aortic regurgitation and mitral regurgitation.  They were unable to evaluate diastolic function.  He was admitted 07/2015 for acute on chronic diastolic heart failure and diuresed with IV lasix. He also underwent DCCV.  He was started on a 1.5g sodium diet.  Eric Mayer was seen in clinic 04/2016 and reported fatigue, shortness of breath, weight gain, and low blood pressures.  At that appointment he was switched from Lasix to Bumex and his valsartan was reduced. He continued to have low blood pressures and saw Eric Mayer on 05/31/16.  He reported dizziness and was found to have vertigo. He was very mildly orthostatic. Since that appointment he has been doing well.  The vertigo resolved on its own.  He notes that his weight has been erratic. It fluctuates wildly with changes in his diet. After eating fried chicken or [place] food his weight goes up nearly 6 pounds in one day. He is quite responsive to the Bumex and his weight goes down 3-4 pounds in one day if he takes the Bumex twice daily. He notes that his energy levels have improved. His breathing has been stable and he denies lower extremity edema, orthopnea, or PND.  Eric Mayer saw Eric Mayer 07/21/16 with increased dyspnea on exertion.   He was noted to be back in atrial fibrillation in the setting of not wearing his CPAP for the preceding week.  He was started on amiodarone 200mg  and metoprolol was reduced to 25mg  bid.  Since that appointment he has been doing well.  He thinks that he went back into rhythm one or two weeks ago.  Since that time he has felt much better.  He notes that his breathing is back to his baseline.  He denies chest pain and gets tired and short of breath, which is his baseline. He has diuresed 10lb since he went back into rhythm.   Past Medical History:  Diagnosis Date  . Anemia 1980s X 1  . Arthritis    "hands, knees, hips, ankles" (07/31/2015)  . Atrial flutter ([place]) 07/31/2015  . Chronic diastolic heart failure ([place])    a. 11/2015: Echo w/ EF of 60-65%, no WMA, Grade 2 DD, trivial AR, ascending aorta mildly dilated.   . Depression   . History of cardiovascular stress test    a. 03/2015: Dobutamine Stress Echo with no evidence of ischemia.   [name] History of gastric bypass   . History of peptic ulcer    1980's  . History of radiation therapy 1993   sarcoma of left groin, tx at [facility]  . History of sarcoma    1993  LEFT GOIN--  S/P SURGERY, RADIATION AND CHEMO IN [place]  . Hypertension   .  Hypogonadism in male   . Incomplete right bundle branch block   . 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 Nocturia   . OSA on CPAP   . PAF (paroxysmal atrial fibrillation) ([place])    prior CARDIOLOGIST-- DR Tressia Miners TURNER/   now monitored by dr Jenny Reichmann griffin  . Primary prostate adenocarcinoma ([place]) DX 07/08/14   Gleason 7,  stage T1c  . PVC's (premature ventricular contractions) 11/21/2015  . Sarcoma ([place]) ~ 1993/1994   "of groin"  . Weakness of left leg    SECONDARY TO SURGICAL NERVE INJURY OF LEFT GOIN  . Wears glasses     Past Surgical History:  Procedure Laterality Date  . CARDIAC CATHETERIZATION  08-12-2003  dr  Tressia Miners turner   Normal coronary arteries, normal wall motion, no sig. abnormalities  . CARDIOVERSION N/A 08/04/2015   Procedure: CARDIOVERSION;  Surgeon: [name], MD;  Location: [facility];  Service: Cardiovascular;  Laterality: N/A;  . COLONOSCOPY  07-03-2002  . [place]  . FRACTURE SURGERY    . GROIN DISSECTION Left 1992   [place]   SARCOMA SURGERY  . INGUINAL HERNIA REPAIR Right 1950s?  . INTESTINAL BYPASS  1976   GASTRIC FOR OBESITY  . JOINT REPLACEMENT    . PATELLA FRACTURE SURGERY Left ~ 1995   "broke it twice; only had OR once"  . PROSTATE BIOPSY  06/2014  . RADIOACTIVE SEED IMPLANT N/A 10/17/2014   Procedure: RADIOACTIVE SEED IMPLANT    ;  Surgeon: Ailene Rud, MD;  Location[facility];  Service: Urology;  Laterality: N/A;   70 SEEDS IMPLANTED   . TONSILLECTOMY  1950s  . TOTAL KNEE ARTHROPLASTY Right 12/17/2014   Procedure: TOTAL KNEE ARTHROPLASTY;  Surgeon: Melrose Nakayama, MD;  Location: [facility];  Service: Orthopedics;  Laterality: Right;  . TRANSTHORACIC ECHOCARDIOGRAM  08-08-2003   moderate LVH/  ef 55-65%/  mild MR/  moderate LAE/  trivial TR/  trivial pericardial effusion posterior to the heart  . VENTRAL HERNIA REPAIR  1977     Current Outpatient Prescriptions  Medication Sig Dispense Refill  . acetaminophen (TYLENOL) 325 MG tablet Take 2 tablets (650 mg total) by mouth every 4 (four) hours as needed for headache or mild pain.    [name] amiodarone (PACERONE) 200 MG tablet TAKE 1 TABLET BY MOUTH TWICE A DAY X'S 2 WEEKS THEN TAKE 1 TABLET BY MOUTH DAILY 42 tablet 1  . amphetamine-dextroamphetamine (ADDERALL XR) 20 MG 24 hr capsule Take 20 mg by mouth daily as needed (ADD).    [name] apixaban (ELIQUIS) 5 MG TABS tablet Take 1 tablet (5 mg total) by mouth 2 (two) times daily. 180 tablet 1  . bumetanide (BUMEX) 2 MG tablet ONE TABLET BY MOUTH DAILY AND AN EXTRA TABLET FOR WEIGHT GAIN 2 LB DAILY OR 5 LB WEEKLY 180 tablet 3  .  buPROPion (WELLBUTRIN XL) 300 MG 24 hr tablet Take 300 mg by mouth every morning.    . cholecalciferol (VITAMIN D) 1000 UNITS tablet Take 1,000 Units by mouth daily.    . Coenzyme Q10 (CO Q 10 PO) Take 300 mg by mouth every evening.     . Cyanocobalamin (VITAMIN B-12 IJ) Inject 1 mL as directed every 30 (thirty) days.     [name] diltiazem (TIAZAC) 180 MG 24 hr capsule Take 1 capsule (180 mg total) by mouth  every morning. 14 capsule 1  . escitalopram (LEXAPRO) 10 MG tablet Take 10 mg by mouth daily.    . magnesium oxide (MAG-OX) 400 MG tablet Take 1 tablet (400 mg total) by mouth daily. 30 tablet 5  . metoprolol (LOPRESSOR) 25 MG tablet Take 1 tablet (25 mg total) by mouth 2 (two) times daily. 60 tablet 1  . Multiple Vitamins-Minerals (PRESERVISION AREDS 2 PO) Take 0.5 tablets by mouth 2 (two) times daily.     . valsartan (DIOVAN) 80 MG tablet Take 1 tablet (80 mg total) by mouth daily. 90 tablet 3   No current facility-administered medications for this visit.     Allergies:   Ace inhibitors and Xarelto [rivaroxaban]    Social History:  The patient  reports that he has never smoked. He has never used smokeless tobacco. He reports that he does not drink alcohol or use drugs.   Family History:  The patient's family history includes Atrial fibrillation in his father; Cancer in his sister; Liver disease in his mother; Renal Disease in his mother; Stroke in his mother.    ROS:  Please see the history of present illness.   Otherwise, review of systems are positive for none.   All other systems are reviewed and negative.    PHYSICAL EXAM: VS:  BP 122/62   Pulse 73   Ht 5\' 8"  (1.727 m)   Wt 128.7 kg (283 lb 12.8 oz)   BMI 43.15 kg/m  , BMI Body mass index is 43.15 kg/m. GENERAL:  Well appearing HEENT:  Pupils equal round and reactive, fundi not visualized, oral mucosa unremarkable NECK:  No JVD sitting upright.  Waveform within normal limits.  Carotid upstroke brisk and symmetric, no  bruits LYMPHATICS:  No cervical,  adenopathy LUNGS:  Clear to auscultation bilaterally CHEST:  Unremarkable HEART:  RRR.  PMI not displaced or sustained,S1 and S2 within normal limits, no S3, no S4, no clicks, no rubs, no murmurs ABD:  Flat, positive bowel sounds normal in frequency in pitch, no bruits, no rebound, no guarding, no midline pulsatile mass, no hepatomegaly, no splenomegaly EXT:  2 plus pulses throughout, 1+ pitting edema to the mid tibia bilaterally.  No cyanosis no clubbing SKIN:  No rashes no nodules.   NEURO:  Cranial nerves II through XII grossly intact, motor grossly intact throughout PSYCH:  Cognitively intact, oriented to person place and time  EKG:  EKG is not ordered today.   04/22/16: Sinus rhythm rate 67 bpm.  RSR' in V1. 05/20/16: Sinus rhythm.  Rate 75 bpm.  PACs.   Echo 12/11/15: Study Conclusions  - Left ventricle: The cavity size was normal. There was severe   focal basal hypertrophy of the septum. Systolic function was   normal. The estimated ejection fraction was in the range of 60%   to 65%. Wall motion was normal; there were no regional wall   motion abnormalities. Features are consistent with a pseudonormal   left ventricular filling pattern, with concomitant abnormal   relaxation and increased filling pressure (grade 2 diastolic   dysfunction). - Aortic valve: Trileaflet; mildly thickened, mildly calcified   leaflets. There was trivial regurgitation. - Aorta: Aortic root dimension: 41 mm (ED). - Ascending aorta: The ascending aorta was mildly dilated. - Left atrium: The atrium was moderately dilated.   Anterior-posterior dimension: 55 mm. Volume/bsa, ES, (1-plane   Simpson&'s, A2C): 40.2 ml/m^2.  Echo 07/08/15: Study Conclusions  - Left ventricle: The cavity size was normal. Wall thickness was  increased in a pattern of moderate LVH. Systolic function was normal. The estimated ejection fraction was in the range of 60% to 65%. The study is  not technically sufficient to allow evaluation of LV diastolic function. - Aortic valve: Trileaflet. Sclerosis without stenosis. There was mild regurgitation. - Mitral valve: Mildly thickened leaflets . There was mild regurgitation. - Left atrium: Moderately dilated at 43 ml/m2. - Right atrium: The atrium was mildly dilated. - Tricuspid valve: There was mild regurgitation. - Pulmonary arteries: PA peak pressure: 31 mm Hg (S). - Inferior vena cava: The vessel was normal in size. The respirophasic diameter changes were in the normal range (= 50%), consistent with normal central venous pressure.  Impressions:  - LVEF 60-65%, moderate LVH, normal wall motion, aortic valve sclerosis with mild AI, MR and TR, RVSP 31 mmHg., moderate LAE, mild RAE.   Recent Labs: 11/21/2015: Brain Natriuretic Peptide 55.6 02/13/2016: Hemoglobin 11.6; Platelets 214 06/21/2016: BUN 35; Creat 1.09; Magnesium 1.4; Potassium 4.4; Sodium 142    Lipid Panel No results found for: CHOL, TRIG, HDL, CHOLHDL, VLDL, LDLCALC, LDLDIRECT    Wt Readings from Last 3 Encounters:  08/11/16 128.7 kg (283 lb 12.8 oz)  07/21/16 129.2 kg (284 lb 12.8 oz)  06/21/16 127 kg (280 lb)     Other studies Reviewed: Additional studies/ records that were reviewed today include: n/a  ASSESSMENT AND PLAN:  # Atrial fibrillation/flutter: # PVCs: Eric Mayer is back in sinus rhythm.  He did not tolerate being atrial fibrillation.  He converted to sinus rhythm with amiodarone.  We will check a CMP, TSH and PFTs.  Continue metoprolol, diltiazem and Xarelto.    # Hypertension:  Blood pressure is well-controlled.  Continue diltiazem, metoprolol, and valsartan.   # Chronic diastolic heart failure:  Volume status is stable.  Continue BP control as above and Bumex.    This patients CHA2DS2-VASc Score and unadjusted Ischemic Stroke Rate (% per year) is equal to 2.2 % stroke rate/year from a score of 2  Above score  calculated as 1 point each if present [CHF, HTN, DM, Vascular=MI/PAD/Aortic Plaque, Age if 65-74, or Male]  Above score calculated as 2 points each if present [Age > 75, or Stroke/TIA/TE]   Current medicines are reviewed at length with the patient today.  The patient does not have concerns regarding medicines.  The following changes have been made:  None  Labs/ tests ordered today include:  No orders of the defined types were placed in this encounter.    Disposition:   FU with Dr. Oval Linsey in 3 months   Signed, [name], MD  08/11/2016 3:28 PM    [place]

## 2016-08-12 ENCOUNTER — Telehealth: Payer: Self-pay | Admitting: *Deleted

## 2016-08-12 ENCOUNTER — Other Ambulatory Visit: Payer: Self-pay | Admitting: Cardiovascular Disease

## 2016-08-12 LAB — COMPREHENSIVE METABOLIC PANEL
ALT: 14 U/L (ref 9–46)
AST: 12 U/L (ref 10–35)
Albumin: 3.7 g/dL (ref 3.6–5.1)
Alkaline Phosphatase: 89 U/L (ref 40–115)
BUN: 28 mg/dL — ABNORMAL HIGH (ref 7–25)
CO2: 24 mmol/L (ref 20–31)
Calcium: 8.7 mg/dL (ref 8.6–10.3)
Chloride: 112 mmol/L — ABNORMAL HIGH (ref 98–110)
Creat: 1.23 mg/dL — ABNORMAL HIGH (ref 0.70–1.18)
Glucose, Bld: 86 mg/dL (ref 65–99)
Potassium: 4.8 mmol/L (ref 3.5–5.3)
Sodium: 146 mmol/L (ref 135–146)
Total Bilirubin: 0.5 mg/dL (ref 0.2–1.2)
Total Protein: 5.7 g/dL — ABNORMAL LOW (ref 6.1–8.1)

## 2016-08-12 LAB — TSH: TSH: 3.2 mIU/L (ref 0.40–4.50)

## 2016-08-12 NOTE — Telephone Encounter (Signed)
Spoke with patient regarding date and time of PFT's at [place] time 8:45 am at the admitting office for 9:00 am appointment.  No caffeine, no smoking and no breathing medications (including inhalers)--4 hour prior to test.  Patient voiced his understanding.

## 2016-08-12 NOTE — Telephone Encounter (Signed)
Please review for refill. Thanks!  

## 2016-08-12 NOTE — Telephone Encounter (Signed)
Rx has been sent to the pharmacy electronically. ° °

## 2016-08-20 ENCOUNTER — Ambulatory Visit (HOSPITAL_COMMUNITY)
Admission: RE | Admit: 2016-08-20 | Discharge: 2016-08-20 | Disposition: A | Discharge status: Home / Self Care | Payer: Medicare Other | Source: Ambulatory Visit | Attending: Cardiovascular Disease | Admitting: Cardiovascular Disease

## 2016-08-20 DIAGNOSIS — I480 Paroxysmal atrial fibrillation: Secondary | ICD-10-CM | POA: Insufficient documentation

## 2016-08-20 DIAGNOSIS — I5032 Chronic diastolic (congestive) heart failure: Secondary | ICD-10-CM | POA: Insufficient documentation

## 2016-08-20 DIAGNOSIS — Z79899 Other long term (current) drug therapy: Secondary | ICD-10-CM | POA: Diagnosis not present

## 2016-08-20 LAB — PULMONARY FUNCTION TEST
DL/VA % pred: 88 %
DL/VA: 3.95 ml/min/mmHg/L
DLCO unc % pred: 56 %
DLCO unc: 16.64 ml/min/mmHg
FEF 25-75 Pre: 0.65 L/sec
FEF2575-%Pred-Pre: 30 %
FEV1-%Pred-Pre: 48 %
FEV1-Pre: 1.4 L
FEV1FVC-%Pred-Pre: 67 %
FEV6-%Pred-Pre: 74 %
FEV6-Pre: 2.76 L
FEV6FVC-%Pred-Pre: 103 %
FVC-%Pred-Pre: 71 %
FVC-Pre: 2.84 L
Pre FEV1/FVC ratio: 49 %
Pre FEV6/FVC Ratio: 97 %
RV % pred: 100 %
RV: 2.43 L
TLC % pred: 81 %
TLC: 5.4 L

## 2016-08-25 ENCOUNTER — Telehealth: Payer: Self-pay | Admitting: *Deleted

## 2016-08-25 DIAGNOSIS — R942 Abnormal results of pulmonary function studies: Secondary | ICD-10-CM

## 2016-08-25 NOTE — Telephone Encounter (Signed)
-----   Message from [name], MD sent at 08/21/2016 12:35 AM EST ----- PFTs are very abnormal and indicate that his lungs may be contributing to his shortness of breath.  I recommend that he be referred to pulmonary to better assess.  Also, amiodarone may not be the best medication to prevent his atrial fibrillation.  Please also refer him to atrial fibrillation clinic to discuss options.

## 2016-08-25 NOTE — Telephone Encounter (Signed)
Advised patient, referral for pulmonary place Spoke with A fib clinic and they would like patient seen by pulmonary first. Will look for scheduled appointment and then call for appt to be scheduled

## 2016-08-31 NOTE — Telephone Encounter (Signed)
Appointment with pulmonary and Afib clinic scheduled

## 2016-09-06 ENCOUNTER — Other Ambulatory Visit: Payer: Self-pay | Admitting: Cardiology

## 2016-09-16 ENCOUNTER — Ambulatory Visit (INDEPENDENT_AMBULATORY_CARE_PROVIDER_SITE_OTHER): Payer: Medicare Other | Admitting: Emergency Medicine

## 2016-09-16 ENCOUNTER — Ambulatory Visit (INDEPENDENT_AMBULATORY_CARE_PROVIDER_SITE_OTHER)
Admission: RE | Admit: 2016-09-16 | Discharge: 2016-09-16 | Disposition: A | Discharge status: Home / Self Care | Payer: Medicare Other | Source: Ambulatory Visit | Attending: Emergency Medicine | Admitting: Emergency Medicine

## 2016-09-16 ENCOUNTER — Encounter: Payer: Self-pay | Admitting: Emergency Medicine

## 2016-09-16 VITALS — BP 112/68 | HR 81 | Ht 68.0 in | Wt 287.8 lb

## 2016-09-16 DIAGNOSIS — R0600 Dyspnea, unspecified: Secondary | ICD-10-CM

## 2016-09-16 DIAGNOSIS — R0609 Other forms of dyspnea: Secondary | ICD-10-CM | POA: Diagnosis not present

## 2016-09-16 DIAGNOSIS — G4733 Obstructive sleep apnea (adult) (pediatric): Secondary | ICD-10-CM | POA: Diagnosis not present

## 2016-09-16 DIAGNOSIS — J45909 Unspecified asthma, uncomplicated: Secondary | ICD-10-CM | POA: Insufficient documentation

## 2016-09-16 NOTE — Progress Notes (Signed)
Subjective:    Patient ID: Eric Mayer, male    DOB: 1940-09-28, 75 y.o.   MRN: [id number]  HPI 76 yo never smoker with A fib on amiodarone since 2 months ago, HTN + chronic diastolic CHF, prostate CA, remote sarcoma s/p excision and XRT, OSA on CPAP reliably, hx gastric bypass. He is under evaluation for progressive dyspnea, underwent a reassuring dobuta stress test  03/27/15, Echocardiogram 06/2015 with normal ejection fraction, mild aortic regurgitation and mitral regurgitation. He has been admitted in the past for acute on chronic dCHF, underwent diuresis and required cardioversion at that time 07/2015. Increased DOE overt the last 2 months, has been back in A fib.  PFTs were performed on 08/20/16 and I have reviewed. This shows a significantly truncated flow volume loop in a pattern suggestive of a fixed intrathoracic obstruction. The spirometry suggests severe obstructive disease, lung volumes are normal, DLCO is decreased and corrects to the normal range for alveolar volume.   He describes SOB w walking even when he is in NSR, affects him at about 100 ft. Occasionally has to stop to rest. No wheezing. No stridor. No cough. He notes that he is SOB when walking in swimming pool. He has experienced some dyspnea before when mowing and exposed to dust.   Past surgeries / intubations > gastric bypass, groin sarcoma, arm and leg surgeries x 3.    Review of Systems  Constitutional: Negative.  Negative for fever and unexpected weight change.  HENT: Positive for rhinorrhea. Negative for congestion, dental problem, ear pain, nosebleeds, postnasal drip, sinus pressure, sneezing, sore throat and trouble swallowing.   Eyes: Negative.  Negative for redness and itching.  Respiratory: Positive for cough and shortness of breath. Negative for chest tightness and wheezing.   Cardiovascular: Positive for leg swelling. Negative for palpitations.  Gastrointestinal: Negative.  Negative for nausea and vomiting.    Genitourinary: Negative.  Negative for dysuria.  Musculoskeletal: Negative.  Negative for joint swelling.  Skin: Negative.  Negative for rash.  Neurological: Negative.  Negative for headaches.  Hematological: Negative.  Does not bruise/bleed easily.  Psychiatric/Behavioral: Negative for dysphoric mood. The patient is not nervous/anxious.     Past Medical History:  Diagnosis Date  . Anemia 1980s X 1  . Arthritis    "hands, knees, hips, ankles" (07/31/2015)  . Atrial flutter ([place]) 07/31/2015  . Chronic diastolic heart failure ([place])    a. 11/2015: Echo w/ EF of 60-65%, no WMA, Grade 2 DD, trivial AR, ascending aorta mildly dilated.   . Depression   . History of cardiovascular stress test    a. 03/2015: Dobutamine Stress Echo with no evidence of ischemia.   [name] History of gastric bypass   . History of peptic ulcer    1980's  . History of radiation therapy 1993   sarcoma of left groin, [facility]  . History of sarcoma    1993  LEFT GOIN--  S/P SURGERY, RADIATION AND CHEMO IN [place]  . Hypertension   . Hypogonadism in male   . Incomplete right bundle branch block   . 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 Nocturia   . OSA on CPAP   . PAF (paroxysmal atrial fibrillation) ([place])    prior CARDIOLOGIST-- DR Tressia Miners TURNER/   now monitored by dr Jenny Reichmann griffin  . Primary prostate adenocarcinoma ([place]) DX 07/08/14   Gleason  7,  stage T1c  . PVC's (premature ventricular contractions) 11/21/2015  . 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 Weakness of left leg    SECONDARY TO SURGICAL NERVE INJURY OF LEFT GOIN  . Wears glasses      Family History  Problem Relation Age of Onset  . Liver disease Mother   . Renal Disease Mother   . Stroke Mother   . Atrial fibrillation Father   . Cancer Sister      Social History   Social History  . Marital status: Married    Spouse name: N/A  . Number of  children: N/A  . Years of education: N/A   Occupational History  . Not on file.   Social History Main Topics  . Smoking status: Never Smoker  . Smokeless tobacco: Never Used  . Alcohol use No  . Drug use: No  . Sexual activity: Not Currently   Other Topics Concern  . Not on file   Social History Narrative  . 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  lived in [place], [place]; has lived around [place]  . Ace Inhibitors Other (See Comments)    cough  . Xarelto [Rivaroxaban] Other (See Comments)    Joint pain     Outpatient Medications Prior to Visit  Medication Sig Dispense Refill  . acetaminophen (TYLENOL) 325 MG tablet Take 2 tablets (650 mg total) by mouth every 4 (four) hours as needed for headache or mild pain.    [name] amiodarone (PACERONE) 200 MG tablet Take 1 tablet (200 mg total) by mouth daily. 30 tablet 6  . bumetanide (BUMEX) 2 MG tablet ONE TABLET BY MOUTH DAILY AND AN EXTRA TABLET FOR WEIGHT GAIN 2 LB DAILY OR 5 LB WEEKLY 180 tablet 3  . buPROPion (WELLBUTRIN XL) 300 MG 24 hr tablet Take 300 mg by mouth every morning.    . cholecalciferol (VITAMIN D) 1000 UNITS tablet Take 1,000 Units by mouth daily.    . Coenzyme Q10 (CO Q 10 PO) Take 300 mg by mouth every evening.     . Cyanocobalamin (VITAMIN B-12 IJ) Inject 1 mL as directed every 30 (thirty) days.     [name] diltiazem (TIAZAC) 180 MG 24 hr capsule Take 1 capsule (180 mg total) by mouth every morning. 14 capsule 1  . ELIQUIS 5 MG TABS tablet TAKE 1 TABLET BY MOUTH 2 TIMES DAILY. 180 tablet 2  . escitalopram (LEXAPRO) 10 MG tablet Take 10 mg by mouth daily.    . magnesium oxide (MAG-OX) 400 MG tablet Take 1 tablet (400 mg total) by mouth daily. 30 tablet 5  . metoprolol tartrate (LOPRESSOR) 25 MG tablet TAKE 1 TABLET BY MOUTH 2  TIMES DAILY. 60 tablet 1  . Multiple Vitamins-Minerals (PRESERVISION AREDS 2 PO) Take 0.5 tablets by mouth 2 (two) times daily.     . valsartan (DIOVAN) 80 MG tablet Take 1 tablet (80 mg total) by mouth daily. 90 tablet 3  . amphetamine-dextroamphetamine (ADDERALL XR) 20 MG 24 hr capsule Take 20 mg by mouth daily as needed (ADD).     No facility-administered medications prior to visit.         Objective:   Physical Exam Vitals:   09/16/16 0912  BP: 112/68  Pulse: 81  SpO2:  96%  Weight: 287 lb 12.8 oz (130.5 kg)  Height: 5\' 8"  (1.727 m)   Gen: Pleasant, overwt man, in no distress,  normal affect  ENT: No lesions,  mouth clear,  oropharynx clear, no postnasal drip  Neck: No JVD, no stridor  Lungs: No use of accessory muscles, clear without rales or rhonchi  Cardiovascular: RRR, heart sounds normal, no murmur or gallops, no peripheral edema  Musculoskeletal: No deformities, no cyanosis or clubbing  Neuro: alert, non focal  Skin: Warm, no lesions or rashes     Assessment & Plan:  Sleep apnea-on C-Pap Tolerates CPAP with good compliance. We will continue this.  Dyspnea on exertion Multifactorial. He has identified that his short windedness is significant when he is in atrial fibrillation, improves when he is in normal sinus rhythm. Likely contribution of obesity. Interestingly his pulmonary function testing reveals a significant obstruction in absence of any history of smoking or history of chronic asthma. 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 He has been intubated before for surgeries and I question whether he may have some degree of tracheal stenosis. I believe that he needs an airway exam with bronchoscopy to further evaluate. If there is no evidence for an upper airway lesion than I believe he merits a trial of a bronchodilator to see if his dyspnea improves. We will have to balance the benefits of this  medication with the potential risk of tachycardia since his atrial fibrillation is clearly a contributor to his dyspnea.   Baltazar Apo, MD, PhD 09/16/2016, 12:21 PM [facility] [phone] or if no answer [phone]

## 2016-09-16 NOTE — Assessment & Plan Note (Signed)
Tolerates CPAP with good compliance. We will continue this.

## 2016-09-16 NOTE — Assessment & Plan Note (Signed)
Multifactorial. He has identified that his short windedness is significant when he is in atrial fibrillation, improves when he is in normal sinus rhythm. Likely contribution of obesity. Interestingly his pulmonary function testing reveals a significant obstruction in absence of any history of smoking or history of chronic asthma. 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 He has been intubated before for surgeries and I question whether he may have some degree of tracheal stenosis. I believe that he needs an airway exam with bronchoscopy to further evaluate. If there is no evidence for an upper airway lesion than I believe he merits a trial of a bronchodilator to see if his dyspnea improves. We will have to balance the benefits of this medication with the potential risk of tachycardia since his atrial fibrillation is clearly a contributor to his dyspnea.

## 2016-09-16 NOTE — Patient Instructions (Signed)
We will perform a CXR today We will schedule bronchoscopy to inspect your airways.  Continue your medications as directed by Cardiology Follow with Dr Lamonte Sakai next available after your procedure to discuss.

## 2016-09-17 ENCOUNTER — Ambulatory Visit (HOSPITAL_COMMUNITY)
Admission: RE | Admit: 2016-09-17 | Discharge: 2016-09-17 | Disposition: A | Discharge status: Home / Self Care | Payer: Medicare Other | Source: Ambulatory Visit | Attending: Nurse Practitioner | Admitting: Nurse Practitioner

## 2016-09-17 ENCOUNTER — Encounter (HOSPITAL_COMMUNITY): Payer: Self-pay | Admitting: Nurse Practitioner

## 2016-09-17 VITALS — BP 118/62 | HR 78 | Ht 68.0 in | Wt 285.8 lb

## 2016-09-17 DIAGNOSIS — Z8546 Personal history of malignant neoplasm of prostate: Secondary | ICD-10-CM | POA: Diagnosis not present

## 2016-09-17 DIAGNOSIS — Z841 Family history of disorders of kidney and ureter: Secondary | ICD-10-CM | POA: Insufficient documentation

## 2016-09-17 DIAGNOSIS — I4510 Unspecified right bundle-branch block: Secondary | ICD-10-CM | POA: Insufficient documentation

## 2016-09-17 DIAGNOSIS — I4892 Unspecified atrial flutter: Secondary | ICD-10-CM | POA: Insufficient documentation

## 2016-09-17 DIAGNOSIS — Z96651 Presence of right artificial knee joint: Secondary | ICD-10-CM | POA: Insufficient documentation

## 2016-09-17 DIAGNOSIS — Z809 Family history of malignant neoplasm, unspecified: Secondary | ICD-10-CM | POA: Diagnosis not present

## 2016-09-17 DIAGNOSIS — Z888 Allergy status to other drugs, medicaments and biological substances status: Secondary | ICD-10-CM | POA: Diagnosis not present

## 2016-09-17 DIAGNOSIS — Z8249 Family history of ischemic heart disease and other diseases of the circulatory system: Secondary | ICD-10-CM | POA: Diagnosis not present

## 2016-09-17 DIAGNOSIS — Z79899 Other long term (current) drug therapy: Secondary | ICD-10-CM | POA: Diagnosis not present

## 2016-09-17 DIAGNOSIS — I5032 Chronic diastolic (congestive) heart failure: Secondary | ICD-10-CM | POA: Diagnosis not present

## 2016-09-17 DIAGNOSIS — Z923 Personal history of irradiation: Secondary | ICD-10-CM | POA: Diagnosis not present

## 2016-09-17 DIAGNOSIS — F329 Major depressive disorder, single episode, unspecified: Secondary | ICD-10-CM | POA: Insufficient documentation

## 2016-09-17 DIAGNOSIS — G4733 Obstructive sleep apnea (adult) (pediatric): Secondary | ICD-10-CM | POA: Insufficient documentation

## 2016-09-17 DIAGNOSIS — R531 Weakness: Secondary | ICD-10-CM | POA: Diagnosis not present

## 2016-09-17 DIAGNOSIS — I110 Hypertensive heart disease with heart failure: Secondary | ICD-10-CM | POA: Insufficient documentation

## 2016-09-17 DIAGNOSIS — Z823 Family history of stroke: Secondary | ICD-10-CM | POA: Insufficient documentation

## 2016-09-17 DIAGNOSIS — Z85831 Personal history of malignant neoplasm of soft tissue: Secondary | ICD-10-CM | POA: Diagnosis not present

## 2016-09-17 DIAGNOSIS — Z9884 Bariatric surgery status: Secondary | ICD-10-CM | POA: Insufficient documentation

## 2016-09-17 DIAGNOSIS — I480 Paroxysmal atrial fibrillation: Secondary | ICD-10-CM | POA: Diagnosis not present

## 2016-09-17 DIAGNOSIS — I481 Persistent atrial fibrillation: Secondary | ICD-10-CM | POA: Diagnosis not present

## 2016-09-17 DIAGNOSIS — I4819 Other persistent atrial fibrillation: Secondary | ICD-10-CM

## 2016-09-17 DIAGNOSIS — Z9889 Other specified postprocedural states: Secondary | ICD-10-CM | POA: Insufficient documentation

## 2016-09-17 NOTE — Progress Notes (Signed)
Primary Care Physician: Irven Shelling, MD Referring Physician: Dr. Lamount Cranker is a 76 y.o. male with a h/o  HTN, paroxysmal atrial fibrillation, chronic diastolic heart failure, and prostate adenocarcinoma.  He had a dobutamine stress echo 03/27/15 that was negative for ischemia.  He had an echo 06/2015 that revealed LVEF 60-65% with mild aortic regurgitation and mitral regurgitation, severe focal basal hypertrophy of the septum. He was admitted 07/2015 for acute on chronic diastolic heart failure and diuresed with IV lasix. He also underwent DCCV.   He was seen 07/21/16 with increased dyspnea with exertion and was noted to be back in afib . He had not been wearing his cpap for the last week.He was started on amiodarone 200 mg bid x 2 weeks then 200 mg daily. Baseline tsh/liver was normal. PFT's were abnormal and he was referred to pulmonology for evaluation. He saw Dr. Lamonte Sakai yesterday. 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 In his note, he did not indicate that the pt had to come off amiodarone at this time. He did state that the potential side effects of the drug are mitigated due to the presence of afib greatly aggravating his breathing.  The pt has felt to be back in rhythm for the last 1-2 weeks and his breathing has improved. EKG shows SR. He ambulates with difficulty, walking with a cane and having arthritis in his spine and joint issues. He does not smoke, no alcohol, is using cpap now, no excessive caffeine. He is morbidly obese, sedentary.  Today, he denies symptoms of palpitations, chest pain, shortness of breath, orthopnea, PND, lower extremity edema, dizziness, presyncope, syncope, or neurologic sequela. The patient is tolerating medications without difficulties and is otherwise without complaint today.   Past Medical History:  Diagnosis Date  . Anemia 1980s X 1  . Arthritis    "hands,  knees, hips, ankles" (07/31/2015)  . Atrial flutter ([place]) 07/31/2015  . Chronic diastolic heart failure ([place])    a. 11/2015: Echo w/ EF of 60-65%, no WMA, Grade 2 DD, trivial AR, ascending aorta mildly dilated.   . Depression   . History of cardiovascular stress test    a. 03/2015: Dobutamine Stress Echo with no evidence of ischemia.   [name] History of gastric bypass   . History of peptic ulcer    1980's  . History of radiation therapy 1993   sarcoma of left groin, tx at [facility]  . History of sarcoma    1993  LEFT GOIN--  S/P SURGERY, RADIATION AND CHEMO IN [place]  . Hypertension   . Hypogonadism in male   . Incomplete right bundle branch block   . 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 Nocturia   . OSA on CPAP   . PAF (paroxysmal atrial fibrillation) ([place])    prior CARDIOLOGIST-- DR Tressia Miners TURNER/   now monitored by dr Jenny Reichmann griffin  . Primary prostate adenocarcinoma ([place]) DX 07/08/14   Gleason 7,  stage T1c  . PVC's (premature ventricular contractions) 11/21/2015  . Sarcoma ([place]) ~ 1993/1994   "of groin"  . Weakness of left leg    SECONDARY TO SURGICAL NERVE INJURY OF LEFT GOIN  . Wears glasses    Past Surgical History:  Procedure Laterality Date  . CARDIAC CATHETERIZATION  08-12-2003  dr Tressia Miners turner   Normal coronary arteries, normal wall motion, no sig. abnormalities  . CARDIOVERSION N/A 08/04/2015   Procedure: CARDIOVERSION;  Surgeon: [name], MD;  Location: [facility];  Service: Cardiovascular;  Laterality: N/A;  . COLONOSCOPY  07-03-2002  . [place]  . FRACTURE SURGERY    . GROIN DISSECTION Left 1992   [place]   SARCOMA SURGERY  . INGUINAL HERNIA REPAIR Right 1950s?  . INTESTINAL BYPASS  1976   GASTRIC FOR OBESITY  . JOINT REPLACEMENT    . PATELLA FRACTURE SURGERY Left ~ 1995   "broke it twice; only had OR once"  . PROSTATE BIOPSY  06/2014    . RADIOACTIVE SEED IMPLANT N/A 10/17/2014   Procedure: RADIOACTIVE SEED IMPLANT    ;  Surgeon: Ailene Rud, MD;  Location[facility];  Service: Urology;  Laterality: N/A;   70 SEEDS IMPLANTED   . TONSILLECTOMY  1950s  . TOTAL KNEE ARTHROPLASTY Right 12/17/2014   Procedure: TOTAL KNEE ARTHROPLASTY;  Surgeon: Melrose Nakayama, MD;  Location: [facility];  Service: Orthopedics;  Laterality: Right;  . TRANSTHORACIC ECHOCARDIOGRAM  08-08-2003   moderate LVH/  ef 55-65%/  mild MR/  moderate LAE/  trivial TR/  trivial pericardial effusion posterior to the heart  . VENTRAL HERNIA REPAIR  1977    Current Outpatient Prescriptions  Medication Sig Dispense Refill  . acetaminophen (TYLENOL) 325 MG tablet Take 2 tablets (650 mg total) by mouth every 4 (four) hours as needed for headache or mild pain.    [name] amiodarone (PACERONE) 200 MG tablet Take 1 tablet (200 mg total) by mouth daily. 30 tablet 6  . bumetanide (BUMEX) 2 MG tablet ONE TABLET BY MOUTH DAILY AND AN EXTRA TABLET FOR WEIGHT GAIN 2 LB DAILY OR 5 LB WEEKLY 180 tablet 3  . buPROPion (WELLBUTRIN XL) 300 MG 24 hr tablet Take 300 mg by mouth every morning.    . cholecalciferol (VITAMIN D) 1000 UNITS tablet Take 1,000 Units by mouth daily.    . Coenzyme Q10 (CO Q 10 PO) Take 300 mg by mouth every evening.     . Cyanocobalamin (VITAMIN B-12 IJ) Inject 1 mL as directed every 30 (thirty) days.     [name] diltiazem (TIAZAC) 180 MG 24 hr capsule Take 1 capsule (180 mg total) by mouth every morning. 14 capsule 1  . ELIQUIS 5 MG TABS tablet TAKE 1 TABLET BY MOUTH 2 TIMES DAILY. 180 tablet 2  . escitalopram (LEXAPRO) 10 MG tablet Take 10 mg by mouth daily.    . magnesium oxide (MAG-OX) 400 MG tablet Take 1 tablet (400 mg total) by mouth daily. 30 tablet 5  . metoprolol tartrate (LOPRESSOR) 25 MG tablet TAKE 1 TABLET BY MOUTH 2 TIMES DAILY. 60 tablet 1  . valsartan (DIOVAN) 80 MG tablet Take 1 tablet (80 mg total) by mouth daily. 90 tablet 3  .  amphetamine-dextroamphetamine (ADDERALL XR) 20 MG 24 hr capsule Take 20 mg by mouth daily as needed (ADD).    . Multiple Vitamins-Minerals (PRESERVISION AREDS 2 PO) Take 0.5 tablets by mouth 2 (two) times daily.      No current facility-administered medications for this encounter.     Allergies  Allergen Reactions  . Ace Inhibitors Other (See Comments)    cough  . Xarelto [Rivaroxaban] Other (See Comments)    Joint pain    Social History   Social History  . Marital status: Married  Spouse name: N/A  . Number of children: N/A  . Years of education: N/A   Occupational History  . Not on file.   Social History Main Topics  . Smoking status: Never Smoker  . Smokeless tobacco: Never Used  . Alcohol use No  . Drug use: No  . Sexual activity: Not Currently   Other Topics Concern  . Not on file   Social History Narrative  . No narrative on file    Family History  Problem Relation Age of Onset  . Liver disease Mother   . Renal Disease Mother   . Stroke Mother   . Atrial fibrillation Father   . Cancer Sister     ROS- All systems are reviewed and negative except as per the HPI above  Physical Exam: Vitals:   09/17/16 0902  BP: 118/62  Pulse: 78  Weight: 285 lb 12.8 oz (129.6 kg)  Height: 5\' 8"  (1.727 m)   Wt Readings from Last 3 Encounters:  09/17/16 285 lb 12.8 oz (129.6 kg)  09/16/16 287 lb 12.8 oz (130.5 kg)  08/11/16 283 lb 12.8 oz (128.7 kg)    Labs: Lab Results  Component Value Date   NA 146 08/11/2016   K 4.8 08/11/2016   CL 112 (H) 08/11/2016   CO2 24 08/11/2016   GLUCOSE 86 08/11/2016   BUN 28 (H) 08/11/2016   CREATININE 1.23 (H) 08/11/2016   CALCIUM 8.7 08/11/2016   MG 1.4 (L) 06/21/2016   Lab Results  Component Value Date   INR 1.32 08/03/2015   No results found for: CHOL, HDL, LDLCALC, TRIG   GEN- The patient is well appearing, alert and oriented x 3 today.   Head- normocephalic, atraumatic Eyes-  Sclera clear, conjunctiva  pink Ears- hearing intact Oropharynx- clear Neck- supple, no JVP Lymph- no cervical lymphadenopathy Lungs- Clear to ausculation bilaterally, normal work of breathing Heart- Regular rate and rhythm, no murmurs, rubs or gallops, PMI not laterally displaced GI- soft, NT, ND, + BS Extremities- no clubbing, cyanosis, or edema MS- no significant deformity or atrophy Skin- no rash or lesion Psych- euthymic mood, full affect Neuro- strength and sensation are intact  EKG- NSR at 78 bpm, Pr int 192 ms, qrs int 102 ms, qtc 460 ms TSH, LIver panel wnl, done 08/11/16 Epic records reviewed ECHO- 4/127/17-- Left ventricle: The cavity size was normal. There was severe   focal basal hypertrophy of the septum. Systolic function was   normal. The estimated ejection fraction was in the range of 60%   to 65%. Wall motion was normal; there were no regional wall   motion abnormalities. Features are consistent with a pseudonormal   left ventricular filling pattern, with concomitant abnormal   relaxation and increased filling pressure (grade 2 diastolic   dysfunction). - Aortic valve: Trileaflet; mildly thickened, mildly calcified   leaflets. There was trivial regurgitation. - Aorta: Aortic root dimension: 41 mm (ED). - Ascending aorta: The ascending aorta was mildly dilated. - Left atrium: The atrium was moderately dilated. 55 mm   Anterior-posterior dimension: 55 mm. Volume/bsa, ES, (1-plane   Simpson&'s, A2C): 40.2 ml/m^2.  PFT's-Conclusions: Although there is severe airway obstruction and a diffusion defect suggesting emphysema, the absence of overinflation indicates a concurrent restrictive process which may account for the diffusion defect. Although bronchodilators were not tested, a clinical trial may be helpful to assess the presence of a reversible component. Pulmonary Function Diagnosis: Severe Obstructive Airways Disease Restriction -Possible Moderate Diffusion Defect   Assessment  and  Plan: 1. The pt has symptomatic persisitent afib Severe left atrial enlargement may undermine long term ability to maintain SR  He is currently in SR with amiodarone 200 mg a day So far, Dr. Lamonte Sakai did not indicate that he had to stop amiodarone but more tests are pending Discussed options, which are limited: pt is on lexapro and would be a contraindication to continue drug if tikoyn/sotalol is started, due to QTC prolongation potential, qtc 460 at baseline Pt states that his mood would suffer greatly if drug was stopped He is not a flecainide candidate to LVH Multaq would be expensive for pt and would have the same qt situation with lexapro, and may not be able to hold SR He is likely not to be an ablation candidate due to severe left atrial enlargement So for now, I don't have a better option than amiodarone  Continue eliquis Continue metoprolol  Will see back in one month at which time Dr. Lamonte Sakai will have completed his tests and will have seen pt in f/u and will defer continued use of  amiodarone to his judgement.   Geroge Baseman Kyerra Vargo, [facility] [address] [place], [place] [address] [phone]

## 2016-09-21 ENCOUNTER — Ambulatory Visit: Payer: Medicare Other | Admitting: Cardiovascular Disease

## 2016-09-21 ENCOUNTER — Encounter (HOSPITAL_COMMUNITY): Payer: Self-pay | Admitting: Respiratory Therapy

## 2016-09-21 ENCOUNTER — Encounter (HOSPITAL_COMMUNITY)
Admission: RE | Disposition: A | Discharge status: Home / Self Care | Payer: Self-pay | Source: Ambulatory Visit | Attending: Emergency Medicine

## 2016-09-21 ENCOUNTER — Ambulatory Visit (HOSPITAL_COMMUNITY)
Admission: RE | Admit: 2016-09-21 | Discharge: 2016-09-21 | Disposition: A | Discharge status: Home / Self Care | Payer: Medicare Other | Source: Ambulatory Visit | Attending: Emergency Medicine | Admitting: Emergency Medicine

## 2016-09-21 DIAGNOSIS — Z8546 Personal history of malignant neoplasm of prostate: Secondary | ICD-10-CM | POA: Diagnosis not present

## 2016-09-21 DIAGNOSIS — Z809 Family history of malignant neoplasm, unspecified: Secondary | ICD-10-CM | POA: Diagnosis not present

## 2016-09-21 DIAGNOSIS — Z8379 Family history of other diseases of the digestive system: Secondary | ICD-10-CM | POA: Diagnosis not present

## 2016-09-21 DIAGNOSIS — R0609 Other forms of dyspnea: Secondary | ICD-10-CM | POA: Diagnosis not present

## 2016-09-21 DIAGNOSIS — Z8249 Family history of ischemic heart disease and other diseases of the circulatory system: Secondary | ICD-10-CM | POA: Insufficient documentation

## 2016-09-21 DIAGNOSIS — I4510 Unspecified right bundle-branch block: Secondary | ICD-10-CM | POA: Insufficient documentation

## 2016-09-21 DIAGNOSIS — I480 Paroxysmal atrial fibrillation: Secondary | ICD-10-CM | POA: Insufficient documentation

## 2016-09-21 DIAGNOSIS — Z8589 Personal history of malignant neoplasm of other organs and systems: Secondary | ICD-10-CM | POA: Insufficient documentation

## 2016-09-21 DIAGNOSIS — Z8711 Personal history of peptic ulcer disease: Secondary | ICD-10-CM | POA: Insufficient documentation

## 2016-09-21 DIAGNOSIS — I5032 Chronic diastolic (congestive) heart failure: Secondary | ICD-10-CM | POA: Insufficient documentation

## 2016-09-21 DIAGNOSIS — Z923 Personal history of irradiation: Secondary | ICD-10-CM | POA: Insufficient documentation

## 2016-09-21 DIAGNOSIS — Z823 Family history of stroke: Secondary | ICD-10-CM | POA: Insufficient documentation

## 2016-09-21 DIAGNOSIS — G4733 Obstructive sleep apnea (adult) (pediatric): Secondary | ICD-10-CM | POA: Insufficient documentation

## 2016-09-21 DIAGNOSIS — F329 Major depressive disorder, single episode, unspecified: Secondary | ICD-10-CM | POA: Insufficient documentation

## 2016-09-21 DIAGNOSIS — Z79899 Other long term (current) drug therapy: Secondary | ICD-10-CM | POA: Insufficient documentation

## 2016-09-21 DIAGNOSIS — Z841 Family history of disorders of kidney and ureter: Secondary | ICD-10-CM | POA: Insufficient documentation

## 2016-09-21 DIAGNOSIS — Z9884 Bariatric surgery status: Secondary | ICD-10-CM | POA: Insufficient documentation

## 2016-09-21 DIAGNOSIS — I110 Hypertensive heart disease with heart failure: Secondary | ICD-10-CM | POA: Diagnosis not present

## 2016-09-21 DIAGNOSIS — Z888 Allergy status to other drugs, medicaments and biological substances status: Secondary | ICD-10-CM | POA: Insufficient documentation

## 2016-09-21 DIAGNOSIS — J45909 Unspecified asthma, uncomplicated: Secondary | ICD-10-CM | POA: Diagnosis present

## 2016-09-21 DIAGNOSIS — R0600 Dyspnea, unspecified: Secondary | ICD-10-CM

## 2016-09-21 DIAGNOSIS — R0602 Shortness of breath: Secondary | ICD-10-CM | POA: Diagnosis present

## 2016-09-21 HISTORY — PX: VIDEO BRONCHOSCOPY: SHX5072

## 2016-09-21 SURGERY — VIDEO BRONCHOSCOPY WITHOUT FLUORO
Anesthesia: Moderate Sedation | Laterality: Bilateral

## 2016-09-21 MED ORDER — PHENYLEPHRINE HCL 0.25 % NA SOLN
NASAL | Status: DC | PRN
  Administered 2016-09-21: 2 via NASAL

## 2016-09-21 MED ORDER — LIDOCAINE HCL 2 % EX GEL: 1.0000 "application " | Freq: Once | CUTANEOUS | Status: DC

## 2016-09-21 MED ORDER — SODIUM CHLORIDE 0.9 % IV SOLN
Freq: Once | INTRAVENOUS | Status: AC
  Administered 2016-09-21: 08:00:00 via INTRAVENOUS

## 2016-09-21 MED ORDER — PHENYLEPHRINE HCL 0.25 % NA SOLN: 1.0000 | Freq: Four times a day (QID) | NASAL | Status: DC | PRN

## 2016-09-21 MED ORDER — LIDOCAINE HCL 2 % EX GEL
CUTANEOUS | Status: DC | PRN
  Administered 2016-09-21: 1

## 2016-09-21 MED ORDER — MIDAZOLAM HCL 5 MG/ML IJ SOLN
INTRAMUSCULAR | Status: AC
  Filled 2016-09-21: qty 2

## 2016-09-21 MED ORDER — FENTANYL CITRATE (PF) 100 MCG/2ML IJ SOLN
INTRAMUSCULAR | Status: AC
  Filled 2016-09-21: qty 4

## 2016-09-21 MED ORDER — FENTANYL CITRATE (PF) 100 MCG/2ML IJ SOLN
INTRAMUSCULAR | Status: DC | PRN
  Administered 2016-09-21: 25 ug via INTRAVENOUS
  Administered 2016-09-21: 50 ug via INTRAVENOUS

## 2016-09-21 MED ORDER — MIDAZOLAM HCL 10 MG/2ML IJ SOLN
INTRAMUSCULAR | Status: DC | PRN
Start: 2016-09-21 — End: 2016-09-21
  Administered 2016-09-21: 3 mg via INTRAVENOUS

## 2016-09-21 MED ORDER — LIDOCAINE HCL (PF) 1 % IJ SOLN
INTRAMUSCULAR | Status: DC | PRN
  Administered 2016-09-21: 6 mL

## 2016-09-21 NOTE — Interval H&P Note (Signed)
PCCM Interval Note Pt presents today for further eval of his dyspnea. As above he has a spirometry pattern that could be consistent with a fixed intrathoracic obstruction. For this reason we have arranged for airway inspection.  No new issues reported today. Dyspnea present but stable.  Vitals:   09/21/16 0745 09/21/16 0750 09/21/16 0755 09/21/16 0800  BP: (!) 120/55 (!) 142/62 132/75 114/70  Pulse: 80 83 84 78  Resp: 16 (!) 23 14 16   Temp:      TempSrc:      SpO2: 100% 100% 100% 100%  Weight:      Height:       Gen: Pleasant, obese man, in no distress,  normal affect  ENT: No lesions,  mouth clear,  oropharynx clear, no postnasal drip  Neck: No JVD, no stridor  Lungs: No use of accessory muscles, clear without rales or rhonchi  Cardiovascular: RRR, heart sounds normal, no murmur or gallops, trace pretibial peripheral edema  Musculoskeletal: No deformities, no cyanosis or clubbing  Neuro: alert, non focal  Skin: Warm, no lesions or rashes   PLANS:  - will proceed with FOB this am. No barriers identified. Pt understands the benefits and risks elects to proceed  Baltazar Apo, MD, PhD 09/21/2016, 8:15 AM  Pulmonary and Critical Care [phone] or if no answer [phone]

## 2016-09-21 NOTE — Op Note (Signed)
[facility] Cardiopulmonary Patient Name: Eric Mayer Date: 09/21/2016 MRN: [id number] Attending MD: Collene Gobble , MD Date of Birth: Sep 29, 1940 CSN: Finalized Age: 76 Admit Type: Outpatient Gender: Male Procedure:            Bronchoscopy Indications:          Shortness of breath Providers:            Collene Gobble, MD, Cherre Huger RRT, RCP, Phillis Knack                        RRT, RCP Referring MD:          Medicines:            Fentanyl 75 mcg IV, Midazolam 3 mg mg IV, Lidocaine 1%                        applied to cords 8 mL, Lidocaine 1% applied to the                        tracheobronchial tree 16 mL Complications:        No immediate complications. Estimated blood loss: None Estimated Blood Loss: Estimated blood loss: none. Procedure:            Pre-Anesthesia Assessment:                       - A History and Physical has been performed. Patient                        meds and allergies have been reviewed. The risks and                        benefits of the procedure and the sedation options and                        risks were discussed with the patient. All questions                        were answered and informed consent was obtained.                        Patient identification and proposed procedure were                        verified prior to the procedure by the physician in the                        procedure room. Mental Status Examination: normal.                        Airway Examination: normal oropharyngeal airway.                        Respiratory Examination: clear to auscultation. CV                        Examination: RRR, no murmurs, no S3 or S4. ASA Grade                        Assessment: II -  A patient with mild systemic disease.                        After reviewing the risks and benefits, the patient was                        deemed in satisfactory condition to undergo the                        procedure. The  anesthesia plan was to use moderate                        sedation / analgesia (conscious sedation). Immediately                        prior to administration of medications, the patient was                        re-assessed for adequacy to receive sedatives. The                        heart rate, respiratory rate, oxygen saturations, blood                        pressure, adequacy of pulmonary ventilation, and                        response to care were monitored throughout the                        procedure. The physical status of the patient was                        re-assessed after the procedure.                       After obtaining informed consent, the bronchoscope was                        passed under direct vision. Throughout the procedure,                        the patient's blood pressure, pulse, and oxygen                        saturations were monitored continuously. the [id number]                        [id number] scope was introduced through the mouth and                        advanced to the tracheobronchial tree. The procedure                        was accomplished without difficulty. The patient                        tolerated the procedure well. The total duration of the  procedure was 15 minutes. Scope In: 8:21:04 AM Scope Out: 8:25:33 AM Findings:      The oropharynx appears normal. The larynx appears normal. The vocal       cords appear normal. The subglottic space is normal. The trachea is of       normal caliber. The carina is sharp. The tracheobronchial tree was       examined to at least the first subsegmental level. Bronchial mucosa and       anatomy are normal; there are no endobronchial lesions, and no       secretions. Impression:           - Shortness of breath                       - The examination was normal.                       - No specimens collected. Moderate Sedation:      Moderate (conscious) sedation was  personally administered by the       endoscopist. The following parameters were monitored: oxygen saturation,       heart rate, blood pressure, respiratory rate, EKG, adequacy of pulmonary       ventilation, and response to care. Total physician intraservice time was       15 minutes. Recommendation:       - Follow up with bronchoscopist in 1 month. Procedure Code(s):    --- Professional ---                       [id number], Bronchoscopy, rigid or flexible, including                        fluoroscopic guidance, when performed; diagnostic, with                        cell washing, when performed (separate procedure) Diagnosis Code(s):    --- Professional ---                       R06.02, Shortness of breath CPT copyright 2016 American Medical Association. All rights reserved. The codes documented in this report are preliminary and upon coder review may  be revised to meet current compliance requirements. Collene Gobble, MD Collene Gobble, MD 09/21/2016 8:37:19 AM Number of Addenda: 0

## 2016-09-21 NOTE — Discharge Instructions (Signed)
Flexible Bronchoscopy, Care After These instructions give you information on caring for yourself after your procedure. Your doctor may also give you more specific instructions. Call your doctor if you have any problems or questions after your procedure. Follow these instructions at home:  Do not eat or drink anything for 2 hours after your procedure. If you try to eat or drink before the medicine wears off, food or drink could go into your lungs. You could also burn yourself.  After 2 hours have passed and when you can cough and gag normally, you may eat soft food and drink liquids slowly.  The day after the test, you may eat your normal diet.  You may do your normal activities.  Keep all doctor visits. Get help right away if:  You get more and more short of breath.  You get light-headed.  You feel like you are going to pass out (faint).  You have chest pain.  You have new problems that worry you.  You cough up more than a little blood.  You cough up more blood than before.   Do not eat or drink anything until 10:30am on 09/21/2016.   PLEASE CALL OUR OFFICE FOR ANY QUESTIONS OR CONCERNS. [phone].  This information is not intended to replace advice given to you by your health care provider. Make sure you discuss any questions you have with your health care provider. Document Released: 05/30/2009 Document Revised: 01/08/2016 Document Reviewed: 04/06/2013 Elsevier Interactive Patient Education  2017 [employer].

## 2016-09-21 NOTE — Progress Notes (Signed)
Video bronchoscopy performed.    No interventions performed; airway exam only.  Blood pressure dropped some post procedure; NS bolus given for increased BP.  Will continue to monitor.

## 2016-09-21 NOTE — H&P (View-Only) (Signed)
Subjective:    Patient ID: Eric Mayer, male    DOB: 01/31/1941, 76 y.o.   MRN: [id number]  HPI 76 yo never smoker with A fib on amiodarone since 2 months ago, HTN + chronic diastolic CHF, prostate CA, remote sarcoma s/p excision and XRT, OSA on CPAP reliably, hx gastric bypass. He is under evaluation for progressive dyspnea, underwent a reassuring dobuta stress test  03/27/15, Echocardiogram 06/2015 with normal ejection fraction, mild aortic regurgitation and mitral regurgitation. He has been admitted in the past for acute on chronic dCHF, underwent diuresis and required cardioversion at that time 07/2015. Increased DOE overt the last 2 months, has been back in A fib.  PFTs were performed on 08/20/16 and I have reviewed. This shows a significantly truncated flow volume loop in a pattern suggestive of a fixed intrathoracic obstruction. The spirometry suggests severe obstructive disease, lung volumes are normal, DLCO is decreased and corrects to the normal range for alveolar volume.   He describes SOB w walking even when he is in NSR, affects him at about 100 ft. Occasionally has to stop to rest. No wheezing. No stridor. No cough. He notes that he is SOB when walking in swimming pool. He has experienced some dyspnea before when mowing and exposed to dust.   Past surgeries / intubations > gastric bypass, groin sarcoma, arm and leg surgeries x 3.    Review of Systems  Constitutional: Negative.  Negative for fever and unexpected weight change.  HENT: Positive for rhinorrhea. Negative for congestion, dental problem, ear pain, nosebleeds, postnasal drip, sinus pressure, sneezing, sore throat and trouble swallowing.   Eyes: Negative.  Negative for redness and itching.  Respiratory: Positive for cough and shortness of breath. Negative for chest tightness and wheezing.   Cardiovascular: Positive for leg swelling. Negative for palpitations.  Gastrointestinal: Negative.  Negative for nausea and vomiting.    Genitourinary: Negative.  Negative for dysuria.  Musculoskeletal: Negative.  Negative for joint swelling.  Skin: Negative.  Negative for rash.  Neurological: Negative.  Negative for headaches.  Hematological: Negative.  Does not bruise/bleed easily.  Psychiatric/Behavioral: Negative for dysphoric mood. The patient is not nervous/anxious.     Past Medical History:  Diagnosis Date  . Anemia 1980s X 1  . Arthritis    "hands, knees, hips, ankles" (07/31/2015)  . Atrial flutter ([place]) 07/31/2015  . Chronic diastolic heart failure ([place])    a. 11/2015: Echo w/ EF of 60-65%, no WMA, Grade 2 DD, trivial AR, ascending aorta mildly dilated.   . Depression   . History of cardiovascular stress test    a. 03/2015: Dobutamine Stress Echo with no evidence of ischemia.   [name] History of gastric bypass   . History of peptic ulcer    1980's  . History of radiation therapy 1993   sarcoma of left groin, tx at [facility]  . History of sarcoma    1993  LEFT GOIN--  S/P SURGERY, RADIATION AND CHEMO IN [place]  . Hypertension   . Hypogonadism in male   . Incomplete right bundle branch block   . 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 Nocturia   . OSA on CPAP   . PAF (paroxysmal atrial fibrillation) ([place])    prior CARDIOLOGIST-- DR Tressia Miners TURNER/   now monitored by dr Jenny Reichmann griffin  . Primary prostate adenocarcinoma ([place]) DX 07/08/14   Gleason  7,  stage T1c  . PVC's (premature ventricular contractions) 11/21/2015  . Sarcoma ([place]) ~ 1993/1994   "of groin"  . Weakness of left leg    SECONDARY TO SURGICAL NERVE INJURY OF LEFT GOIN  . Wears glasses      Family History  Problem Relation Age of Onset  . Liver disease Mother   . Renal Disease Mother   . Stroke Mother   . Atrial fibrillation Father   . Cancer Sister      Social History   Social History  . Marital status: Married    Spouse name: N/A  . Number of  children: N/A  . Years of education: N/A   Occupational History  . Not on file.   Social History Main Topics  . Smoking status: Never Smoker  . Smokeless tobacco: Never Used  . Alcohol use No  . Drug use: No  . Sexual activity: Not Currently   Other Topics Concern  . Not on file   Social History Narrative  . 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  lived in [place], [place]; has lived around [place] Reactions  . Ace Inhibitors Other (See Comments)    cough  . Xarelto [Rivaroxaban] Other (See Comments)    Joint pain     Outpatient Medications Prior to Visit  Medication Sig Dispense Refill  . acetaminophen (TYLENOL) 325 MG tablet Take 2 tablets (650 mg total) by mouth every 4 (four) hours as needed for headache or mild pain.    [name] amiodarone (PACERONE) 200 MG tablet Take 1 tablet (200 mg total) by mouth daily. 30 tablet 6  . bumetanide (BUMEX) 2 MG tablet ONE TABLET BY MOUTH DAILY AND AN EXTRA TABLET FOR WEIGHT GAIN 2 LB DAILY OR 5 LB WEEKLY 180 tablet 3  . buPROPion (WELLBUTRIN XL) 300 MG 24 hr tablet Take 300 mg by mouth every morning.    . cholecalciferol (VITAMIN D) 1000 UNITS tablet Take 1,000 Units by mouth daily.    . Coenzyme Q10 (CO Q 10 PO) Take 300 mg by mouth every evening.     . Cyanocobalamin (VITAMIN B-12 IJ) Inject 1 mL as directed every 30 (thirty) days.     [name] diltiazem (TIAZAC) 180 MG 24 hr capsule Take 1 capsule (180 mg total) by mouth every morning. 14 capsule 1  . ELIQUIS 5 MG TABS tablet TAKE 1 TABLET BY MOUTH 2 TIMES DAILY. 180 tablet 2  . escitalopram (LEXAPRO) 10 MG tablet Take 10 mg by mouth daily.    . magnesium oxide (MAG-OX) 400 MG tablet Take 1 tablet (400 mg total) by mouth daily. 30 tablet 5  . metoprolol tartrate (LOPRESSOR) 25 MG tablet TAKE 1 TABLET BY MOUTH 2  TIMES DAILY. 60 tablet 1  . Multiple Vitamins-Minerals (PRESERVISION AREDS 2 PO) Take 0.5 tablets by mouth 2 (two) times daily.     . valsartan (DIOVAN) 80 MG tablet Take 1 tablet (80 mg total) by mouth daily. 90 tablet 3  . amphetamine-dextroamphetamine (ADDERALL XR) 20 MG 24 hr capsule Take 20 mg by mouth daily as needed (ADD).     No facility-administered medications prior to visit.         Objective:   Physical Exam Vitals:   09/16/16 0912  BP: 112/68  Pulse: 81  SpO2:  96%  Weight: 287 lb 12.8 oz (130.5 kg)  Height: 5\' 8"  (1.727 m)   Gen: Pleasant, overwt man, in no distress,  normal affect  ENT: No lesions,  mouth clear,  oropharynx clear, no postnasal drip  Neck: No JVD, no stridor  Lungs: No use of accessory muscles, clear without rales or rhonchi  Cardiovascular: RRR, heart sounds normal, no murmur or gallops, no peripheral edema  Musculoskeletal: No deformities, no cyanosis or clubbing  Neuro: alert, non focal  Skin: Warm, no lesions or rashes     Assessment & Plan:  Sleep apnea-on C-Pap Tolerates CPAP with good compliance. We will continue this.  Dyspnea on exertion Multifactorial. He has identified that his short windedness is significant when he is in atrial fibrillation, improves when he is in normal sinus rhythm. Likely contribution of obesity. Interestingly his pulmonary function testing reveals a significant obstruction in absence of any history of smoking or history of chronic asthma. 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 He has been intubated before for surgeries and I question whether he may have some degree of tracheal stenosis. I believe that he needs an airway exam with bronchoscopy to further evaluate. If there is no evidence for an upper airway lesion than I believe he merits a trial of a bronchodilator to see if his dyspnea improves. We will have to balance the benefits of this  medication with the potential risk of tachycardia since his atrial fibrillation is clearly a contributor to his dyspnea.   Baltazar Apo, MD, PhD 09/16/2016, 12:21 PM [facility] [phone] or if no answer [phone]

## 2016-09-22 ENCOUNTER — Encounter: Payer: Self-pay | Admitting: Emergency Medicine

## 2016-09-22 ENCOUNTER — Encounter (HOSPITAL_COMMUNITY): Payer: Self-pay | Admitting: Emergency Medicine

## 2016-09-22 NOTE — Telephone Encounter (Signed)
RB please advise. Thanks.  

## 2016-10-15 ENCOUNTER — Ambulatory Visit (INDEPENDENT_AMBULATORY_CARE_PROVIDER_SITE_OTHER): Payer: Medicare Other | Admitting: Emergency Medicine

## 2016-10-15 ENCOUNTER — Encounter: Payer: Self-pay | Admitting: Emergency Medicine

## 2016-10-15 DIAGNOSIS — J301 Allergic rhinitis due to pollen: Secondary | ICD-10-CM | POA: Diagnosis not present

## 2016-10-15 DIAGNOSIS — J309 Allergic rhinitis, unspecified: Secondary | ICD-10-CM | POA: Insufficient documentation

## 2016-10-15 DIAGNOSIS — G4733 Obstructive sleep apnea (adult) (pediatric): Secondary | ICD-10-CM | POA: Diagnosis not present

## 2016-10-15 DIAGNOSIS — R0609 Other forms of dyspnea: Secondary | ICD-10-CM

## 2016-10-15 MED ORDER — BUDESONIDE-FORMOTEROL FUMARATE 160-4.5 MCG/ACT IN AERO: 2.0000 | INHALATION_SPRAY | Freq: Two times a day (BID) | RESPIRATORY_TRACT | 0 refills | Status: DC

## 2016-10-15 MED ORDER — FLUTICASONE PROPIONATE 50 MCG/ACT NA SUSP: 2.0000 | Freq: Every day | NASAL | 5 refills | Status: DC

## 2016-10-15 NOTE — Assessment & Plan Note (Signed)
Start flonase qd, see if this helps rhinitis and cough.

## 2016-10-15 NOTE — Addendum Note (Signed)
Addended by: Tyson Dense on: 10/15/2016 12:23 PM   Modules accepted: Orders

## 2016-10-15 NOTE — Assessment & Plan Note (Signed)
Pulmonary function testing most consistent with significant obstructive disease. His bronchoscopy did not show any evidence of a fixed intrathoracic obstruction. I like to treat him with a scheduled bronchodilator to see if he benefits. Try Symbicort twice a day.. Don't see any evidence ILD or amiodarone contribution at this time. May decide to perfor CT chest at some point depending on his response to current therapy.

## 2016-10-15 NOTE — Progress Notes (Signed)
Patient seen in the office today and instructed on use of Symbicort 160.  Patient expressed understanding and demonstrated technique. 

## 2016-10-15 NOTE — Patient Instructions (Addendum)
We will do a trial of Symbicort 2 puffs twice a day for the next month. Rinse and gargle after using.  Please start fluticasone NS, 2 sprays each nostril daily.  Follow with Dr Lamonte Sakai in 1 month

## 2016-10-15 NOTE — Progress Notes (Signed)
Subjective:    Patient ID: Eric Mayer, male    DOB: [birth year], 76 y.o.   MRN: [id number]  HPI 76 yo never smoker with A fib on amiodarone since 2 months ago, HTN + chronic diastolic CHF, prostate CA, remote sarcoma s/p excision and XRT, OSA on CPAP reliably, hx gastric bypass. He is under evaluation for progressive dyspnea, underwent a reassuring dobuta stress test  03/27/15, Echocardiogram 06/2015 with normal ejection fraction, mild aortic regurgitation and mitral regurgitation. He has been admitted in the past for acute on chronic dCHF, underwent diuresis and required cardioversion at that time 07/2015. Increased DOE overt the last 2 months, has been back in A fib.  PFTs were performed on 08/20/16 and I have reviewed. This shows a significantly truncated flow volume loop in a pattern suggestive of a fixed intrathoracic obstruction. The spirometry suggests severe obstructive disease, lung volumes are normal, DLCO is decreased and corrects to the normal range for alveolar volume.   He describes SOB w walking even when he is in NSR, affects him at about 100 ft. Occasionally has to stop to rest. No wheezing. No stridor. No cough. He notes that he is SOB when walking in swimming pool. He has experienced some dyspnea before when mowing and exposed to dust.   Past surgeries / intubations > gastric bypass, groin sarcoma, arm and leg surgeries x 3.   ROV 10/15/16 -- Never smoker with a history of atrial fibrillation on amiodarone, diastolic dysfunction, LEEP apnea on CPAP. I saw him for dyspnea in early February his spirometry was suggestive of a possible fixed intrathoracic obstruction. For this reason we Performed bronchoscopy on 09/21/16. This showed no evidence of a intrathoracic obstruction, tracheal abnormality. The vocal cords were normal. He is having nasal congestion and cough.    Review of Systems  Constitutional: Negative.  Negative for fever and unexpected weight change.  HENT: Positive for  rhinorrhea. Negative for congestion, dental problem, ear pain, nosebleeds, postnasal drip, sinus pressure, sneezing, sore throat and trouble swallowing.   Eyes: Negative.  Negative for redness and itching.  Respiratory: Positive for cough and shortness of breath. Negative for chest tightness and wheezing.   Cardiovascular: Positive for leg swelling. Negative for palpitations.  Gastrointestinal: Negative.  Negative for nausea and vomiting.  Genitourinary: Negative.  Negative for dysuria.  Musculoskeletal: Negative.  Negative for joint swelling.  Skin: Negative.  Negative for rash.  Neurological: Negative.  Negative for headaches.  Hematological: Negative.  Does not bruise/bleed easily.  Psychiatric/Behavioral: Negative for dysphoric mood. The patient is not nervous/anxious.     Past Medical History:  Diagnosis Date  . Anemia 1980s X 1  . Arthritis    "hands, knees, hips, ankles" (07/31/2015)  . Atrial flutter ([place]) 07/31/2015  . Chronic diastolic heart failure ([place])    a. 11/2015: Echo w/ EF of 60-65%, no WMA, Grade 2 DD, trivial AR, ascending aorta mildly dilated.   . Depression   . History of cardiovascular stress test    a. 03/2015: Dobutamine Stress Echo with no evidence of ischemia.   [name] History of gastric bypass   . History of peptic ulcer    1980's  . History of radiation therapy 1993   sarcoma of left groin, tx at [facility]  . History of sarcoma    1993  LEFT GOIN--  S/P SURGERY, RADIATION AND CHEMO IN [place]  . Hypertension   . Hypogonadism in male   . Incomplete right bundle branch block   .  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 Nocturia   . OSA on CPAP   . PAF (paroxysmal atrial fibrillation) ([place])    prior CARDIOLOGIST-- DR Tressia Miners TURNER/   now monitored by dr Jenny Reichmann griffin  . Primary prostate adenocarcinoma ([place]) DX 07/08/14   Gleason 7,  stage T1c  . PVC's (premature  ventricular contractions) 11/21/2015  . Sarcoma ([place]) ~ 1993/1994   "of groin"  . Weakness of left leg    SECONDARY TO SURGICAL NERVE INJURY OF LEFT GOIN  . Wears glasses      Family History  Problem Relation Age of Onset  . Liver disease Mother   . Renal Disease Mother   . Stroke Mother   . Atrial fibrillation Father   . Cancer Sister      Social History   Social History  . Marital status: Married    Spouse name: N/A  . Number of children: N/A  . Years of education: N/A   Occupational History  . Not on file.   Social History Main Topics  . Smoking status: Never Smoker  . Smokeless tobacco: Never Used  . Alcohol use No  . Drug use: No  . Sexual activity: Not Currently   Other Topics Concern  . Not on file   Social History Narrative  . 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  lived in [place], [place]; has lived around [place] Reactions  . Ace Inhibitors Other (See Comments)    cough  . Xarelto [Rivaroxaban] Other (See Comments)    Joint pain     Outpatient Medications Prior to Visit  Medication Sig Dispense Refill  . acetaminophen (TYLENOL) 325 MG tablet Take 2 tablets (650 mg total) by mouth every 4 (four) hours as needed for headache or mild pain.    [name] amiodarone (PACERONE) 200 MG tablet Take 1 tablet (200 mg total) by mouth daily. 30 tablet 6  . amphetamine-dextroamphetamine (ADDERALL XR) 20 MG 24 hr capsule Take 20 mg by mouth daily as needed (ADD).    [name] bumetanide (BUMEX) 2 MG tablet ONE TABLET BY MOUTH DAILY AND AN EXTRA TABLET FOR WEIGHT GAIN 2 LB DAILY OR 5 LB WEEKLY 180 tablet 3  . buPROPion (WELLBUTRIN XL) 300 MG 24 hr tablet Take 300 mg by mouth every morning.    . cholecalciferol (VITAMIN D) 1000 UNITS tablet Take 1,000 Units by mouth daily.    . Coenzyme Q10 (CO Q 10  PO) Take 300 mg by mouth every evening.     . Cyanocobalamin (VITAMIN B-12 IJ) Inject 1 mL as directed every 30 (thirty) days.     [name] diltiazem (TIAZAC) 180 MG 24 hr capsule Take 1 capsule (180 mg total) by mouth every morning. 14 capsule 1  . ELIQUIS 5 MG TABS tablet TAKE 1 TABLET BY MOUTH 2 TIMES DAILY. 180 tablet 2  . escitalopram (LEXAPRO) 10 MG tablet Take 10 mg by mouth daily.    . magnesium oxide (MAG-OX) 400 MG tablet Take 1 tablet (400 mg total) by mouth daily. 30 tablet 5  . metoprolol tartrate (LOPRESSOR) 25 MG tablet TAKE 1 TABLET BY MOUTH 2 TIMES DAILY. 60 tablet 1  . Multiple Vitamins-Minerals (PRESERVISION AREDS  2 PO) Take 0.5 tablets by mouth 2 (two) times daily.     . valsartan (DIOVAN) 80 MG tablet Take 1 tablet (80 mg total) by mouth daily. 90 tablet 3   No facility-administered medications prior to visit.         Objective:   Physical Exam Vitals:   10/15/16 0950  BP: 130/60  Pulse: 76  SpO2: 96%  Weight: 287 lb (130.2 kg)  Height: 5\' 8"  (1.727 m)   Gen: Pleasant, overwt man, in no distress,  normal affect  ENT: No lesions,  mouth clear,  oropharynx clear, no postnasal drip  Neck: No JVD, no stridor  Lungs: No use of accessory muscles, clear without rales or rhonchi  Cardiovascular: RRR, heart sounds normal, no murmur or gallops, no peripheral edema  Musculoskeletal: No deformities, no cyanosis or clubbing  Neuro: alert, non focal  Skin: Warm, no lesions or rashes     Assessment & Plan:  Dyspnea on exertion Pulmonary function testing most consistent with significant obstructive disease. His bronchoscopy did not show any evidence of a fixed intrathoracic obstruction. I like to treat him with a scheduled bronchodilator to see if he benefits. Try Symbicort twice a day.. Don't see any evidence ILD or amiodarone contribution at this time. May decide to perfor CT chest at some point depending on his response to current therapy.    Allergic rhinitis Start  flonase qd, see if this helps rhinitis and cough.   Sleep apnea-on C-Pap continue CPAP   Baltazar Apo, MD, PhD 10/15/2016, 10:24 AM  [facility] [phone] or if no answer [phone]

## 2016-10-15 NOTE — Assessment & Plan Note (Signed)
--  continue CPAP

## 2016-10-19 ENCOUNTER — Ambulatory Visit (HOSPITAL_COMMUNITY)
Admission: RE | Admit: 2016-10-19 | Discharge: 2016-10-19 | Disposition: A | Discharge status: Home / Self Care | Payer: Medicare Other | Source: Ambulatory Visit | Attending: Nurse Practitioner | Admitting: Nurse Practitioner

## 2016-10-19 ENCOUNTER — Encounter (HOSPITAL_COMMUNITY): Payer: Self-pay | Admitting: Nurse Practitioner

## 2016-10-19 VITALS — BP 122/64 | HR 85 | Ht 68.0 in | Wt 285.4 lb

## 2016-10-19 DIAGNOSIS — Z9889 Other specified postprocedural states: Secondary | ICD-10-CM | POA: Insufficient documentation

## 2016-10-19 DIAGNOSIS — Z9884 Bariatric surgery status: Secondary | ICD-10-CM | POA: Insufficient documentation

## 2016-10-19 DIAGNOSIS — I5032 Chronic diastolic (congestive) heart failure: Secondary | ICD-10-CM | POA: Diagnosis not present

## 2016-10-19 DIAGNOSIS — F329 Major depressive disorder, single episode, unspecified: Secondary | ICD-10-CM | POA: Diagnosis not present

## 2016-10-19 DIAGNOSIS — I4510 Unspecified right bundle-branch block: Secondary | ICD-10-CM | POA: Diagnosis not present

## 2016-10-19 DIAGNOSIS — Z888 Allergy status to other drugs, medicaments and biological substances status: Secondary | ICD-10-CM | POA: Insufficient documentation

## 2016-10-19 DIAGNOSIS — R531 Weakness: Secondary | ICD-10-CM | POA: Insufficient documentation

## 2016-10-19 DIAGNOSIS — I480 Paroxysmal atrial fibrillation: Secondary | ICD-10-CM | POA: Insufficient documentation

## 2016-10-19 DIAGNOSIS — Z809 Family history of malignant neoplasm, unspecified: Secondary | ICD-10-CM | POA: Insufficient documentation

## 2016-10-19 DIAGNOSIS — I110 Hypertensive heart disease with heart failure: Secondary | ICD-10-CM | POA: Insufficient documentation

## 2016-10-19 DIAGNOSIS — Z79899 Other long term (current) drug therapy: Secondary | ICD-10-CM | POA: Insufficient documentation

## 2016-10-19 DIAGNOSIS — Z923 Personal history of irradiation: Secondary | ICD-10-CM | POA: Insufficient documentation

## 2016-10-19 DIAGNOSIS — Z841 Family history of disorders of kidney and ureter: Secondary | ICD-10-CM | POA: Diagnosis not present

## 2016-10-19 DIAGNOSIS — Z9221 Personal history of antineoplastic chemotherapy: Secondary | ICD-10-CM | POA: Insufficient documentation

## 2016-10-19 DIAGNOSIS — Z96651 Presence of right artificial knee joint: Secondary | ICD-10-CM | POA: Insufficient documentation

## 2016-10-19 DIAGNOSIS — Z85831 Personal history of malignant neoplasm of soft tissue: Secondary | ICD-10-CM | POA: Insufficient documentation

## 2016-10-19 DIAGNOSIS — I4892 Unspecified atrial flutter: Secondary | ICD-10-CM | POA: Diagnosis not present

## 2016-10-19 DIAGNOSIS — Z823 Family history of stroke: Secondary | ICD-10-CM | POA: Diagnosis not present

## 2016-10-19 DIAGNOSIS — I481 Persistent atrial fibrillation: Secondary | ICD-10-CM

## 2016-10-19 DIAGNOSIS — I4819 Other persistent atrial fibrillation: Secondary | ICD-10-CM

## 2016-10-19 DIAGNOSIS — Z8546 Personal history of malignant neoplasm of prostate: Secondary | ICD-10-CM | POA: Insufficient documentation

## 2016-10-19 NOTE — Progress Notes (Signed)
Primary Care Physician: Irven Shelling, MD Referring Physician: Dr. Lamount Cranker is a 76 y.o. male with a h/o  HTN, paroxysmal atrial fibrillation, chronic diastolic heart failure, and prostate adenocarcinoma.  He had a dobutamine stress echo 03/27/15 that was negative for ischemia.  He had an echo 06/2015 that revealed LVEF 60-65% with mild aortic regurgitation and mitral regurgitation, severe focal basal hypertrophy of the septum. He was admitted 07/2015 for acute on chronic diastolic heart failure and diuresed with IV lasix. He also underwent DCCV.   He was seen 07/21/16 with increased dyspnea with exertion and was noted to be back in afib . He had not been wearing his cpap for the last week.He was started on amiodarone 200 mg bid x 2 weeks then 200 mg daily. Baseline tsh/liver was normal. PFT's were abnormal and he was referred to pulmonology for evaluation. He saw Dr. Lamonte Sakai yesterday. 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 In his note, he did not indicate that the pt had to come off amiodarone at this time. He did state that the potential side effects of the drug are mitigated due to the presence of afib greatly aggravating his breathing.  The pt has felt to be back in rhythm for the last 1-2 weeks and his breathing has improved. EKG shows SR. He ambulates with difficulty, walking with a cane and having arthritis in his spine and joint issues. He does not smoke, no alcohol, is using cpap now, no excessive caffeine. He is morbidly obese, sedentary.  Today, he denies symptoms of palpitations, chest pain, shortness of breath, orthopnea, PND, lower extremity edema, dizziness, presyncope, syncope, or neurologic sequela. The patient is tolerating medications without difficulties and is otherwise without complaint today.   Past Medical History:  Diagnosis Date  . Anemia 1980s X 1  . Arthritis    "hands,  knees, hips, ankles" (07/31/2015)  . Atrial flutter ([place]) 07/31/2015  . Chronic diastolic heart failure ([place])    a. 11/2015: Echo w/ EF of 60-65%, no WMA, Grade 2 DD, trivial AR, ascending aorta mildly dilated.   . Depression   . History of cardiovascular stress test    a. 03/2015: Dobutamine Stress Echo with no evidence of ischemia.   [name] History of gastric bypass   . History of peptic ulcer    1980's  . History of radiation therapy 1993   sarcoma of left groin, tx at [facility]  . History of sarcoma    1993  LEFT GOIN--  S/P SURGERY, RADIATION AND CHEMO IN [place]  . Hypertension   . Hypogonadism in male   . Incomplete right bundle branch block   . 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 Nocturia   . OSA on CPAP   . PAF (paroxysmal atrial fibrillation) ([place])    prior CARDIOLOGIST-- DR Tressia Miners TURNER/   now monitored by dr Jenny Reichmann griffin  . Primary prostate adenocarcinoma ([place]) DX 07/08/14   Gleason 7,  stage T1c  . PVC's (premature ventricular contractions) 11/21/2015  . Sarcoma ([place]) ~ 1993/1994   "of groin"  . Weakness of left leg    SECONDARY TO SURGICAL NERVE INJURY OF LEFT GOIN  . Wears glasses    Past Surgical History:  Procedure Laterality Date  . CARDIAC CATHETERIZATION  08-12-2003  dr Tressia Miners turner   Normal coronary arteries, normal wall motion, no sig. abnormalities  . CARDIOVERSION N/A 08/04/2015   Procedure: CARDIOVERSION;  Surgeon: [name], MD;  Location: [place];  Service: Cardiovascular;  Laterality: N/A;  . COLONOSCOPY  07-03-2002  . [place]  . FRACTURE SURGERY    . GROIN DISSECTION Left 1992   [place]   SARCOMA SURGERY  . INGUINAL HERNIA REPAIR Right 1950s?  . INTESTINAL BYPASS  1976   GASTRIC FOR OBESITY  . JOINT REPLACEMENT    . PATELLA FRACTURE SURGERY Left ~ 1995   "broke it twice; only had OR once"  . PROSTATE BIOPSY  06/2014    . RADIOACTIVE SEED IMPLANT N/A 10/17/2014   Procedure: RADIOACTIVE SEED IMPLANT    ;  Surgeon: Ailene Rud, MD;  Location: [facility];  Service: Urology;  Laterality: N/A;   68 SEEDS IMPLANTED   . TONSILLECTOMY  1950s  . TOTAL KNEE ARTHROPLASTY Right 12/17/2014   Procedure: TOTAL KNEE ARTHROPLASTY;  Surgeon: Melrose Nakayama, MD;  Location: [place];  Service: Orthopedics;  Laterality: Right;  . TRANSTHORACIC ECHOCARDIOGRAM  08-08-2003   moderate LVH/  ef 55-65%/  mild MR/  moderate LAE/  trivial TR/  trivial pericardial effusion posterior to the heart  . [place]  . VIDEO BRONCHOSCOPY Bilateral 09/21/2016   Procedure: VIDEO BRONCHOSCOPY WITHOUT FLUORO;  Surgeon: Collene Gobble, MD;  Location: [place];  Service: Cardiopulmonary;  Laterality: Bilateral;    Current Outpatient Prescriptions  Medication Sig Dispense Refill  . acetaminophen (TYLENOL) 325 MG tablet Take 2 tablets (650 mg total) by mouth every 4 (four) hours as needed for headache or mild pain.    [name] amiodarone (PACERONE) 200 MG tablet Take 1 tablet (200 mg total) by mouth daily. 30 tablet 6  . amphetamine-dextroamphetamine (ADDERALL XR) 20 MG 24 hr capsule Take 20 mg by mouth daily as needed (ADD).    [name] budesonide-formoterol (SYMBICORT) 160-4.5 MCG/ACT inhaler Inhale 2 puffs into the lungs 2 (two) times daily. 3 Inhaler 0  . bumetanide (BUMEX) 2 MG tablet ONE TABLET BY MOUTH DAILY AND AN EXTRA TABLET FOR WEIGHT GAIN 2 LB DAILY OR 5 LB WEEKLY 180 tablet 3  . buPROPion (WELLBUTRIN XL) 300 MG 24 hr tablet Take 300 mg by mouth every morning.    . cholecalciferol (VITAMIN D) 1000 UNITS tablet Take 1,000 Units by mouth daily.    . Coenzyme Q10 (CO Q 10 PO) Take 300 mg by mouth every evening.     . Cyanocobalamin (VITAMIN B-12 IJ) Inject 1 mL as directed every 30 (thirty) days.     [name] diltiazem (TIAZAC) 180 MG 24 hr capsule Take 1 capsule (180 mg total) by mouth every morning. 14 capsule 1  . ELIQUIS  5 MG TABS tablet TAKE 1 TABLET BY MOUTH 2 TIMES DAILY. 180 tablet 2  . escitalopram (LEXAPRO) 10 MG tablet Take 10 mg by mouth daily.    . fluticasone (FLONASE) 50 MCG/ACT nasal spray Place 2 sprays into both nostrils daily. 16 g 5  . magnesium oxide (MAG-OX) 400 MG tablet Take 1 tablet (400 mg total) by mouth daily. 30 tablet 5  . metoprolol tartrate (LOPRESSOR) 25 MG tablet TAKE 1 TABLET BY MOUTH 2 TIMES DAILY. 60 tablet 1  . valsartan (DIOVAN) 80 MG tablet Take 1 tablet (80 mg total) by mouth daily. 90 tablet 3   No current facility-administered medications for this encounter.  Allergies  Allergen Reactions  . Ace Inhibitors Other (See Comments)    cough  . Xarelto [Rivaroxaban] Other (See Comments)    Joint pain    Social History   Social History  . Marital status: Married    Spouse name: N/A  . Number of children: N/A  . Years of education: N/A   Occupational History  . Not on file.   Social History Main Topics  . Smoking status: Never Smoker  . Smokeless tobacco: Never Used  . Alcohol use No  . Drug use: No  . Sexual activity: Not Currently   Other Topics Concern  . Not on file   Social History Narrative  . No narrative on file    Family History  Problem Relation Age of Onset  . Liver disease Mother   . Renal Disease Mother   . Stroke Mother   . Atrial fibrillation Father   . Cancer Sister     ROS- All systems are reviewed and negative except as per the HPI above  Physical Exam: Vitals:   10/19/16 0919  BP: 122/64  Pulse: 85  Weight: 285 lb 6.4 oz (129.5 kg)  Height: 5\' 8"  (1.727 m)   Wt Readings from Last 3 Encounters:  10/19/16 285 lb 6.4 oz (129.5 kg)  10/15/16 287 lb (130.2 kg)  09/21/16 278 lb (126.1 kg)    Labs: Lab Results  Component Value Date   NA 146 08/11/2016   K 4.8 08/11/2016   CL 112 (H) 08/11/2016   CO2 24 08/11/2016   GLUCOSE 86 08/11/2016   BUN 28 (H) 08/11/2016   CREATININE 1.23 (H) 08/11/2016   CALCIUM 8.7  08/11/2016   MG 1.4 (L) 06/21/2016   Lab Results  Component Value Date   INR 1.32 08/03/2015   No results found for: CHOL, HDL, LDLCALC, TRIG   GEN- The patient is well appearing, alert and oriented x 3 today.   Head- normocephalic, atraumatic Eyes-  Sclera clear, conjunctiva pink Ears- hearing intact Oropharynx- clear Neck- supple, no JVP Lymph- no cervical lymphadenopathy Lungs- Clear to ausculation bilaterally, normal work of breathing Heart- Regular rate and rhythm, no murmurs, rubs or gallops, PMI not laterally displaced GI- soft, NT, ND, + BS Extremities- no clubbing, cyanosis, or edema MS- no significant deformity or atrophy Skin- no rash or lesion Psych- euthymic mood, full affect Neuro- strength and sensation are intact  EKG- NSR at 78 bpm, Pr int 192 ms, qrs int 102 ms, qtc 460 ms TSH, LIver panel wnl, done 08/11/16 Epic records reviewed ECHO- 4/127/17-- Left ventricle: The cavity size was normal. There was severe   focal basal hypertrophy of the septum. Systolic function was   normal. The estimated ejection fraction was in the range of 60%   to 65%. Wall motion was normal; there were no regional wall   motion abnormalities. Features are consistent with a pseudonormal   left ventricular filling pattern, with concomitant abnormal   relaxation and increased filling pressure (grade 2 diastolic   dysfunction). - Aortic valve: Trileaflet; mildly thickened, mildly calcified   leaflets. There was trivial regurgitation. - Aorta: Aortic root dimension: 41 mm (ED). - Ascending aorta: The ascending aorta was mildly dilated. - Left atrium: The atrium was moderately dilated. 55 mm   Anterior-posterior dimension: 55 mm. Volume/bsa, ES, (1-plane   Simpson&'s, A2C): 40.2 ml/m^2.  PFT's-Conclusions: Although there is severe airway obstruction and a diffusion defect suggesting emphysema, the absence of overinflation indicates a concurrent restrictive process which  may account  for the diffusion defect. Although bronchodilators were not tested, a clinical trial may be helpful to assess the presence of a reversible component. Pulmonary Function Diagnosis: Severe Obstructive Airways Disease Restriction -Possible Moderate Diffusion Defect Bronchoscopy 09/21/16 without abnormalities   Assessment and Plan: 1. The pt has symptomatic persisitent afib Severe left atrial enlargement may undermine long term ability to maintain SR  He is currently in SR with amiodarone 200 mg a day and maintaining SR  Dr. Lamonte Sakai did not indicate that he had to stop amiodarone by tests done so far Discussed options, which are limited: pt is on lexapro and would be a contraindication to continue drug if tikoyn/sotalol is started, due to QTC prolongation potential, qtc 460 at baseline Pt states that his mood would suffer greatly if drug was stopped He is not a flecainide candidate to LVH Multaq would be expensive for pt and would have the same qt situation with lexapro, and may not be able to hold SR He is likely not to be an ablation candidate due to severe left atrial enlargement So for now, I don't have a better option than amiodarone  Continue eliquis Continue metoprolol   F/u with Dr.[place] 3/27 F/u Dr. Lamonte Sakai 4/26  afib clinic as needed  Butch Penny C. Michaelpaul Apo, [facility] [address] [place], [place] [address] [phone]

## 2016-11-04 ENCOUNTER — Other Ambulatory Visit: Payer: Self-pay | Admitting: Cardiology

## 2016-11-05 NOTE — Telephone Encounter (Signed)
REFILL 

## 2016-11-08 NOTE — Progress Notes (Signed)
Cardiology Office Note   Date:  11/09/2016   ID:  LYNDELL ALLAIRE, DOB 1941-01-01, MRN [id number]  PCP:  Irven Shelling, MD  Cardiologist:   [name], MD   Chief Complaint  Patient presents with  . Follow-up    pt denied chest pain and SOB     History of Present Illness: BURKE TERRY is a 76 y.o. male with HTN, paroxysmal atrial fibrillation, chronic diastolic heart failure, BPPV, and prostate adenocarcinoma here for follow up.  Mr. George initially presented 03/2015 with dyspnea on exertion and chronic lower extremity edema.  He had a dobutamine stress echo 03/27/15 that was negative for ischemia.  He had an echo 06/2015 that revealed LVEF 60-65% with mild aortic regurgitation and mitral regurgitation.  They were unable to evaluate diastolic function.  He was admitted 07/2015 for acute on chronic diastolic heart failure and diuresed with IV lasix. He also underwent DCCV.  Mr. Chauvin was seen in clinic 04/2016 and reported fatigue, shortness of breath, weight gain, and low blood pressures.  At that appointment he was switched from Lasix to Bumex and his valsartan was reduced. His weight is labile and fluctuates with dieteary changes.  He has been very responsive to extra doses of Bumex as needed.  Mr. Mccauley saw [name] 07/21/16 with increased dyspnea on exertion.  He was noted to be back in atrial fibrillation in the setting of not wearing his CPAP for the preceding week.  He was started on amiodarone 200mg  and metoprolol was reduced to 25mg  bid.  He converted back to sinus rhythm and diuresed 10 lb after returning to sinus rhythm.  Given that he was on amiodarone he was referred for PFTs 08/20/16 that revealed severe obstruction and likely moderate restriction.  There is also moderate diffusion defect.  He was referred to pulmonology and saw Dr. Louis Meckel. He underwent bronchoscopy on 09/21/16 which showed no intrathoracic obstruction or tracheal abnormality. He was  started on Symbicort.  He was also referred to atrial fibrillation for consideration of alternatives to amiodarone. At this time there was not felt to be any pharmacologic alternatives to amiodarone. He was thought to be a poor candidate for ablation.    Since his last appointment Mr. Woon has been doing very well. He notes that his inhalers have helped with his breathing significantly. He denies lower extremity edema, orthopnea, or PND. He's been able to walk longer distances without getting short of breath. He also hasn't noted any chest pain or pressure. His main complaint is arthritis in his knees. He sometimes has swelling of the left knee.  He denies recurrent palpitations, lightheadedness, or dizziness.   Past Medical History:  Diagnosis Date  . Anemia 1980s X 1  . Arthritis    "hands, knees, hips, ankles" (07/31/2015)  . Atrial flutter ([place]) 07/31/2015  . Chronic diastolic heart failure ([place])    a. 11/2015: Echo w/ EF of 60-65%, no WMA, Grade 2 DD, trivial AR, ascending aorta mildly dilated.   . Depression   . History of cardiovascular stress test    a. 03/2015: Dobutamine Stress Echo with no evidence of ischemia.   [name] History of gastric bypass   . History of peptic ulcer    1980's  . History of radiation therapy 1993   sarcoma of left groin, [facility] at [facility]  . History of sarcoma    1993  LEFT GOIN--  S/P SURGERY, RADIATION AND CHEMO IN [place]  . Hypertension   .  Hypogonadism in male   . Incomplete right bundle branch block   . 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 Nocturia   . OSA on CPAP   . PAF (paroxysmal atrial fibrillation) ([place])    prior CARDIOLOGIST-- DR Tressia Miners TURNER/   now monitored by dr Jenny Reichmann griffin  . Primary prostate adenocarcinoma ([place]) DX 07/08/14   Gleason 7,  stage T1c  . PVC's (premature ventricular contractions) 11/21/2015  . Sarcoma ([place]) ~ 1993/1994   "of groin"    . Weakness of left leg    SECONDARY TO SURGICAL NERVE INJURY OF LEFT GOIN  . Wears glasses     Past Surgical History:  Procedure Laterality Date  . CARDIAC CATHETERIZATION  08-12-2003  dr Tressia Miners turner   Normal coronary arteries, normal wall motion, no sig. abnormalities  . CARDIOVERSION N/A 08/04/2015   Procedure: CARDIOVERSION;  Surgeon: [name], MD;  Location: [place];  Service: Cardiovascular;  Laterality: N/A;  . COLONOSCOPY  07-03-2002  . [place]  . FRACTURE SURGERY    . GROIN DISSECTION Left 1992   [place]   SARCOMA SURGERY  . INGUINAL HERNIA REPAIR Right 1950s?  . INTESTINAL BYPASS  1976   GASTRIC FOR OBESITY  . JOINT REPLACEMENT    . PATELLA FRACTURE SURGERY Left ~ 1995   "broke it twice; only had OR once"  . PROSTATE BIOPSY  06/2014  . RADIOACTIVE SEED IMPLANT N/A 10/17/2014   Procedure: RADIOACTIVE SEED IMPLANT    ;  Surgeon: Ailene Rud, MD;  Location[facility];  Service: Urology;  Laterality: N/A;   68 SEEDS IMPLANTED   . TONSILLECTOMY  1950s  . TOTAL KNEE ARTHROPLASTY Right 12/17/2014   Procedure: TOTAL KNEE ARTHROPLASTY;  Surgeon: Melrose Nakayama, MD;  Location: [place];  Service: Orthopedics;  Laterality: Right;  . TRANSTHORACIC ECHOCARDIOGRAM  08-08-2003   moderate LVH/  ef 55-65%/  mild MR/  moderate LAE/  trivial TR/  trivial pericardial effusion posterior to the heart  . [place]  . VIDEO BRONCHOSCOPY Bilateral 09/21/2016   Procedure: VIDEO BRONCHOSCOPY WITHOUT FLUORO;  Surgeon: Collene Gobble, MD;  Location: [place];  Service: Cardiopulmonary;  Laterality: Bilateral;     Current Outpatient Prescriptions  Medication Sig Dispense Refill  . acetaminophen (TYLENOL) 325 MG tablet Take 2 tablets (650 mg total) by mouth every 4 (four) hours as needed for headache or mild pain.    [name] amiodarone (PACERONE) 200 MG tablet Take 1 tablet (200 mg total) by mouth daily. 30 tablet 6  .  amphetamine-dextroamphetamine (ADDERALL XR) 20 MG 24 hr capsule Take 20 mg by mouth daily as needed (ADD).    [name] budesonide-formoterol (SYMBICORT) 160-4.5 MCG/ACT inhaler Inhale 2 puffs into the lungs 2 (two) times daily. 3 Inhaler 0  . bumetanide (BUMEX) 2 MG tablet ONE TABLET BY MOUTH DAILY AND AN EXTRA TABLET FOR WEIGHT GAIN 2 LB DAILY OR 5 LB WEEKLY 180 tablet 3  . buPROPion (WELLBUTRIN XL) 300 MG 24 hr tablet Take 300 mg by mouth every morning.    . cholecalciferol (VITAMIN D) 1000 UNITS tablet Take 1,000 Units by mouth daily.    . Coenzyme Q10 (CO Q 10 PO) Take 300 mg by mouth every evening.     . Cyanocobalamin (VITAMIN B-12 IJ) Inject 1 mL as directed every 30 (thirty) days.     [name]  diltiazem (TIAZAC) 180 MG 24 hr capsule Take 1 capsule (180 mg total) by mouth every morning. 14 capsule 1  . ELIQUIS 5 MG TABS tablet TAKE 1 TABLET BY MOUTH 2 TIMES DAILY. 180 tablet 2  . escitalopram (LEXAPRO) 10 MG tablet Take 10 mg by mouth daily.    . fluticasone (FLONASE) 50 MCG/ACT nasal spray Place 2 sprays into both nostrils daily. 16 g 5  . magnesium oxide (MAG-OX) 400 MG tablet Take 1 tablet (400 mg total) by mouth daily. 30 tablet 5  . metoprolol tartrate (LOPRESSOR) 25 MG tablet TAKE 1 TABLET BY MOUTH 2 TIMES DAILY. 60 tablet 0  . tamsulosin (FLOMAX) 0.4 MG CAPS capsule Take 2 capsules by mouth at bedtime.    . valsartan (DIOVAN) 80 MG tablet Take 1 tablet (80 mg total) by mouth daily. 90 tablet 3   No current facility-administered medications for this visit.     Allergies:   Ace inhibitors and Xarelto [rivaroxaban]    Social History:  The patient  reports that he has never smoked. He has never used smokeless tobacco. He reports that he does not drink alcohol or use drugs.   Family History:  The patient's family history includes Atrial fibrillation in his father; Cancer in his sister; Liver disease in his mother; Renal Disease in his mother; Stroke in his mother.    ROS:  Please see the  history of present illness.   Otherwise, review of systems are positive for none.   All other systems are reviewed and negative.    PHYSICAL EXAM: VS:  BP 107/60   Pulse 78   Ht 5\' 10"  (1.778 m)   Wt 130.2 kg (287 lb)   BMI 41.18 kg/m  , BMI Body mass index is 41.18 kg/m. GENERAL:  Well appearing HEENT:  Pupils equal round and reactive, fundi not visualized, oral mucosa unremarkable NECK:  No JVD sitting upright.  Waveform within normal limits.  Carotid upstroke brisk and symmetric, no bruits LYMPHATICS:  No cervical,  adenopathy LUNGS:  Clear to auscultation bilaterally HEART:  RRR.  PMI not displaced or sustained,S1 and S2 within normal limits, no S3, no S4, no clicks, no rubs, no murmurs ABD:  Flat, positive bowel sounds normal in frequency in pitch, no bruits, no rebound, no guarding, no midline pulsatile mass, no hepatomegaly, no splenomegaly EXT:  2 plus pulses throughout, trace edema.  No cyanosis no clubbing SKIN:  No rashes no nodules.   NEURO:  Cranial nerves II through XII grossly intact, motor grossly intact throughout PSYCH:  Cognitively intact, oriented to person place and time  EKG:  EKG is not ordered today.   04/22/16: Sinus rhythm rate 67 bpm.  RSR' in V1. 05/20/16: Sinus rhythm.  Rate 75 bpm.  PACs.   Echo 12/11/15: Study Conclusions  - Left ventricle: The cavity size was normal. There was severe   focal basal hypertrophy of the septum. Systolic function was   normal. The estimated ejection fraction was in the range of 60%   to 65%. Wall motion was normal; there were no regional wall   motion abnormalities. Features are consistent with a pseudonormal   left ventricular filling pattern, with concomitant abnormal   relaxation and increased filling pressure (grade 2 diastolic   dysfunction). - Aortic valve: Trileaflet; mildly thickened, mildly calcified   leaflets. There was trivial regurgitation. - Aorta: Aortic root dimension: 41 mm (ED). - Ascending aorta: The  ascending aorta was mildly dilated. - Left atrium: The atrium  was moderately dilated.   Anterior-posterior dimension: 55 mm. Volume/bsa, ES, (1-plane   Simpson&'s, A2C): 40.2 ml/m^2.  Echo 07/08/15: Study Conclusions  - Left ventricle: The cavity size was normal. Wall thickness was increased in a pattern of moderate LVH. Systolic function was normal. The estimated ejection fraction was in the range of 60% to 65%. The study is not technically sufficient to allow evaluation of LV diastolic function. - Aortic valve: Trileaflet. Sclerosis without stenosis. There was mild regurgitation. - Mitral valve: Mildly thickened leaflets . There was mild regurgitation. - Left atrium: Moderately dilated at 43 ml/m2. - Right atrium: The atrium was mildly dilated. - Tricuspid valve: There was mild regurgitation. - Pulmonary arteries: PA peak pressure: 31 mm Hg (S). - Inferior vena cava: The vessel was normal in size. The respirophasic diameter changes were in the normal range (= 50%), consistent with normal central venous pressure.  Impressions:  - LVEF 60-65%, moderate LVH, normal wall motion, aortic valve sclerosis with mild AI, MR and TR, RVSP 31 mmHg., moderate LAE, mild RAE.   Recent Labs: 11/21/2015: Brain Natriuretic Peptide 55.6 02/13/2016: Hemoglobin 11.6; Platelets 214 06/21/2016: Magnesium 1.4 08/11/2016: ALT 14; BUN 28; Creat 1.23; Potassium 4.8; Sodium 146; TSH 3.20    Lipid Panel No results found for: CHOL, TRIG, HDL, CHOLHDL, VLDL, LDLCALC, LDLDIRECT    Wt Readings from Last 3 Encounters:  11/09/16 130.2 kg (287 lb)  10/19/16 129.5 kg (285 lb 6.4 oz)  10/15/16 130.2 kg (287 lb)     Other studies Reviewed: Additional studies/ records that were reviewed today include: n/a  ASSESSMENT AND PLAN:  # Atrial fibrillation/flutter: # PVCs: Mr. Dase remains in sinus rhythm.  He did not tolerate being atrial fibrillation.  He converted to sinus rhythm with  amiodarone.  Continue metoprolol, diltiazem and Xarelto.    # Hypertension:  Blood pressure is well-controlled.  Continue diltiazem, metoprolol, and valsartan.   # Chronic diastolic heart failure:  Volume status is stable.  Continue BP control as above and Bumex.    This patients CHA2DS2-VASc Score and unadjusted Ischemic Stroke Rate (% per year) is equal to 2.2 % stroke rate/year from a score of 2  Above score calculated as 1 point each if present [CHF, HTN, DM, Vascular=MI/PAD/Aortic Plaque, Age if 65-74, or Male]  Above score calculated as 2 points each if present [Age > 75, or Stroke/TIA/TE]   Current medicines are reviewed at length with the patient today.  The patient does not have concerns regarding medicines.  The following changes have been made:  None  Labs/ tests ordered today include:  Orders Placed This Encounter  Procedures  . Lipid panel     Disposition:   FU with Dr. Oval Linsey in 4 months   Signed, [name], MD  11/09/2016 9:17 AM    [place]

## 2016-11-09 ENCOUNTER — Ambulatory Visit: Payer: Medicare Other | Admitting: Cardiovascular Disease

## 2016-11-09 ENCOUNTER — Ambulatory Visit (INDEPENDENT_AMBULATORY_CARE_PROVIDER_SITE_OTHER): Payer: Medicare Other | Admitting: Cardiovascular Disease

## 2016-11-09 VITALS — BP 107/60 | HR 78 | Ht 70.0 in | Wt 287.0 lb

## 2016-11-09 DIAGNOSIS — Z13220 Encounter for screening for lipoid disorders: Secondary | ICD-10-CM | POA: Diagnosis not present

## 2016-11-09 DIAGNOSIS — I10 Essential (primary) hypertension: Secondary | ICD-10-CM | POA: Diagnosis not present

## 2016-11-09 DIAGNOSIS — R0602 Shortness of breath: Secondary | ICD-10-CM

## 2016-11-09 DIAGNOSIS — I493 Ventricular premature depolarization: Secondary | ICD-10-CM

## 2016-11-09 DIAGNOSIS — I5032 Chronic diastolic (congestive) heart failure: Secondary | ICD-10-CM | POA: Diagnosis not present

## 2016-11-09 DIAGNOSIS — I481 Persistent atrial fibrillation: Secondary | ICD-10-CM

## 2016-11-09 DIAGNOSIS — Z7901 Long term (current) use of anticoagulants: Secondary | ICD-10-CM

## 2016-11-09 DIAGNOSIS — I4819 Other persistent atrial fibrillation: Secondary | ICD-10-CM

## 2016-11-09 NOTE — Patient Instructions (Addendum)
Medication Instructions:  Your physician recommends that you continue on your current medications as directed. Please refer to the Current Medication list given to you today.  Labwork: FASTING LIPID PANEL SOON AT [place] [address]  Testing/Procedures: NONE  Follow-Up: Your physician wants you to follow-up in: 4 [place] will receive a reminder letter in the mail two months in advance. If you don't receive a letter, please call our office to schedule the follow-up appointment.  If you need a refill on your cardiac medications before your next appointment, please call your pharmacy.

## 2016-12-02 ENCOUNTER — Other Ambulatory Visit: Payer: Self-pay | Admitting: Cardiology

## 2016-12-02 ENCOUNTER — Other Ambulatory Visit: Payer: Self-pay | Admitting: Cardiovascular Disease

## 2016-12-02 NOTE — Telephone Encounter (Signed)
Refill Request.  

## 2016-12-02 NOTE — Telephone Encounter (Signed)
REFILL 

## 2016-12-09 ENCOUNTER — Ambulatory Visit (INDEPENDENT_AMBULATORY_CARE_PROVIDER_SITE_OTHER): Payer: Medicare Other | Admitting: Emergency Medicine

## 2016-12-09 ENCOUNTER — Encounter: Payer: Self-pay | Admitting: Emergency Medicine

## 2016-12-09 DIAGNOSIS — G4733 Obstructive sleep apnea (adult) (pediatric): Secondary | ICD-10-CM

## 2016-12-09 DIAGNOSIS — J45909 Unspecified asthma, uncomplicated: Secondary | ICD-10-CM

## 2016-12-09 MED ORDER — ALBUTEROL SULFATE HFA 108 (90 BASE) MCG/ACT IN AERS: 2.0000 | INHALATION_SPRAY | Freq: Four times a day (QID) | RESPIRATORY_TRACT | 1 refills | Status: DC | PRN

## 2016-12-09 MED ORDER — BUDESONIDE-FORMOTEROL FUMARATE 160-4.5 MCG/ACT IN AERO: 2.0000 | INHALATION_SPRAY | Freq: Two times a day (BID) | RESPIRATORY_TRACT | 1 refills | Status: DC

## 2016-12-09 NOTE — Patient Instructions (Addendum)
Please continue Symbicort 2 puffs twice a day We will order albuterol, use 2 puffs up to every 4 hours if needed for shortness of breath.  Continue to work on exercise and slowly increasing your functional capacity.  Continue your CPAP every night.  Follow with Dr Lamonte Sakai in 6 months or sooner if you have any problems

## 2016-12-09 NOTE — Progress Notes (Signed)
Subjective:    Patient ID: Eric Mayer, male    DOB: 1940/12/06, 76 y.o.   MRN: [id number]  HPI 76 yo never smoker with A fib on amiodarone since 2 months ago, HTN + chronic diastolic CHF, prostate CA, remote sarcoma s/p excision and XRT, OSA on CPAP reliably, hx gastric bypass. He is under evaluation for progressive dyspnea, underwent a reassuring dobuta stress test  03/27/15, Echocardiogram 06/2015 with normal ejection fraction, mild aortic regurgitation and mitral regurgitation. He has been admitted in the past for acute on chronic dCHF, underwent diuresis and required cardioversion at that time 07/2015. Increased DOE overt the last 2 months, has been back in A fib.  PFTs were performed on 08/20/16 and I have reviewed. This shows a significantly truncated flow volume loop in a pattern suggestive of a fixed intrathoracic obstruction. The spirometry suggests severe obstructive disease, lung volumes are normal, DLCO is decreased and corrects to the normal range for alveolar volume.   He describes SOB w walking even when he is in NSR, affects him at about 100 ft. Occasionally has to stop to rest. No wheezing. No stridor. No cough. He notes that he is SOB when walking in swimming pool. He has experienced some dyspnea before when mowing and exposed to dust.   Past surgeries / intubations > gastric bypass, groin sarcoma, arm and leg surgeries x 3.   ROV 10/15/16 -- Never smoker with a history of atrial fibrillation on amiodarone, diastolic dysfunction, LEEP apnea on CPAP. I saw him for dyspnea in early February his spirometry was suggestive of a possible fixed intrathoracic obstruction. For this reason we Performed bronchoscopy on 09/21/16. This showed no evidence of a intrathoracic obstruction, tracheal abnormality. The vocal cords were normal. He is having nasal congestion and cough.   ROV 12/09/16 -- This follow-up visit for obstructive lung disease. I had suspected a possible intrathoracic obstruction  but there was no evidence for this on bronchoscopy. We decided to initiated and empiric trial of Symbicort. He returns to report that he has noticed that it has helped his breathing - he has been more active, up and around. Was able to walk on recent vacation, more that recent months. May have some throat irritation and hoarseness from the [facility]. Good compliance with his CPAP, feels that he is benefiting from it.    Review of Systems  Constitutional: Negative.  Negative for fever and unexpected weight change.  HENT: Positive for rhinorrhea. Negative for congestion, dental problem, ear pain, nosebleeds, postnasal drip, sinus pressure, sneezing, sore throat and trouble swallowing.   Eyes: Negative.  Negative for redness and itching.  Respiratory: Positive for cough and shortness of breath. Negative for chest tightness and wheezing.   Cardiovascular: Positive for leg swelling. Negative for palpitations.  Gastrointestinal: Negative.  Negative for nausea and vomiting.  Genitourinary: Negative.  Negative for dysuria.  Musculoskeletal: Negative.  Negative for joint swelling.  Skin: Negative.  Negative for rash.  Neurological: Negative.  Negative for headaches.  Hematological: Negative.  Does not bruise/bleed easily.  Psychiatric/Behavioral: Negative for dysphoric mood. The patient is not nervous/anxious.     Past Medical History:  Diagnosis Date  . Anemia 1980s X 1  . Arthritis    "hands, knees, hips, ankles" (07/31/2015)  . Atrial flutter ([place]) 07/31/2015  . Chronic diastolic heart failure ([place])    a. 11/2015: Echo w/ EF of 60-65%, no WMA, Grade 2 DD, trivial AR, ascending aorta mildly dilated.   . Depression   .  History of cardiovascular stress test    a. 03/2015: Dobutamine Stress Echo with no evidence of ischemia.   [name] History of gastric bypass   . History of peptic ulcer    1980's  . History of radiation therapy 1993   sarcoma of left groin, tx at [facility]  . History of sarcoma    1993   LEFT GOIN--  S/P SURGERY, RADIATION AND CHEMO IN [place]  . Hypertension   . Hypogonadism in male   . Incomplete right bundle branch block   . 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 Nocturia   . OSA on CPAP   . PAF (paroxysmal atrial fibrillation) ([place])    prior CARDIOLOGIST-- DR Tressia Miners TURNER/   now monitored by dr Jenny Reichmann griffin  . Primary prostate adenocarcinoma ([place]) DX 07/08/14   Gleason 7,  stage T1c  . PVC's (premature ventricular contractions) 11/21/2015  . Sarcoma ([place]) ~ 1993/1994   "of groin"  . Weakness of left leg    SECONDARY TO SURGICAL NERVE INJURY OF LEFT GOIN  . Wears glasses      Family History  Problem Relation Age of Onset  . Liver disease Mother   . Renal Disease Mother   . Stroke Mother   . Atrial fibrillation Father   . Cancer Sister      Social History   Social History  . Marital status: Married    Spouse name: N/A  . Number of children: N/A  . Years of education: N/A   Occupational History  . Not on file.   Social History Main Topics  . Smoking status: Never Smoker  . Smokeless tobacco: Never Used  . Alcohol use No  . Drug use: No  . Sexual activity: Not Currently   Other Topics Concern  . Not on file   Social History Narrative  . 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  lived in [place], [place]; has lived around [place] Reactions  . Ace Inhibitors Other (See Comments)    cough  . Xarelto [Rivaroxaban] Other (See Comments)    Joint pain     Outpatient Medications Prior to Visit  Medication Sig Dispense Refill  . acetaminophen (TYLENOL) 325 MG tablet Take 2 tablets (650 mg total) by mouth every 4 (four) hours as needed for headache or mild pain.    [name] amiodarone  (PACERONE) 200 MG tablet Take 1 tablet (200 mg total) by mouth daily. 30 tablet 6  . amphetamine-dextroamphetamine (ADDERALL XR) 20 MG 24 hr capsule Take 20 mg by mouth daily as needed (ADD).    [name] budesonide-formoterol (SYMBICORT) 160-4.5 MCG/ACT inhaler Inhale 2 puffs into the lungs 2 (two) times daily. 3 Inhaler 0  . bumetanide (BUMEX) 2 MG tablet ONE TABLET BY MOUTH DAILY AND AN EXTRA TABLET FOR WEIGHT GAIN 2 LB DAILY OR 5 LB WEEKLY 180 tablet 3  . buPROPion (WELLBUTRIN XL) 300 MG 24 hr tablet Take 300 mg by mouth every morning.    . cholecalciferol (VITAMIN D) 1000 UNITS tablet Take 1,000 Units by mouth daily.    . Coenzyme Q10 (CO Q 10 PO) Take 300 mg by mouth every evening.     . Cyanocobalamin (VITAMIN B-12  IJ) Inject 1 mL as directed every 30 (thirty) days.     [name] diltiazem (TIAZAC) 180 MG 24 hr capsule Take 1 capsule (180 mg total) by mouth every morning. 14 capsule 1  . ELIQUIS 5 MG TABS tablet TAKE 1 TABLET BY MOUTH 2 TIMES DAILY. 180 tablet 2  . escitalopram (LEXAPRO) 10 MG tablet Take 10 mg by mouth daily.    . fluticasone (FLONASE) 50 MCG/ACT nasal spray Place 2 sprays into both nostrils daily. 16 g 5  . Magnesium Oxide 400 (240 Mg) MG TABS TAKE 1 TABLET (400 MG TOTAL) BY MOUTH DAILY. 30 tablet 5  . metoprolol tartrate (LOPRESSOR) 25 MG tablet TAKE 1 TABLET BY MOUTH 2 TIMES DAILY. 60 tablet 3  . tamsulosin (FLOMAX) 0.4 MG CAPS capsule Take 2 capsules by mouth at bedtime.    . valsartan (DIOVAN) 80 MG tablet Take 1 tablet (80 mg total) by mouth daily. 90 tablet 3   No facility-administered medications prior to visit.         Objective:   Physical Exam Vitals:   12/09/16 0918  BP: (!) 120/58  Pulse: 68  SpO2: 97%  Weight: 288 lb (130.6 kg)  Height: 5\' 8"  (1.727 m)   Gen: Pleasant, overwt man, in no distress,  normal affect  ENT: No lesions,  mouth clear,  oropharynx clear, no postnasal drip  Neck: No JVD, no stridor  Lungs: No use of accessory muscles, clear without  rales or rhonchi  Cardiovascular: RRR, heart sounds normal, no murmur or gallops, no peripheral edema  Musculoskeletal: No deformities, no cyanosis or clubbing  Neuro: alert, non focal  Skin: Warm, no lesions or rashes     Assessment & Plan:  Sleep apnea-on C-Pap Good compliance, clinically benefiting from CPAP. Will continue same mask and pressures.   Intrinsic asthma Obstruction noted on spirometry consistent with asthma. He has clinically benefited from Symbicort. He tolerates the medication well. We will order through his pharmacy. I also would like for him to have albuterol to use as needed.  Baltazar Apo, MD, PhD 12/09/2016, 9:41 AM [facility] [phone] or if no answer [phone]

## 2016-12-09 NOTE — Addendum Note (Signed)
Addended by: Jannette Spanner on: 12/09/2016 09:52 AM   Modules accepted: Orders

## 2016-12-09 NOTE — Assessment & Plan Note (Signed)
Obstruction noted on spirometry consistent with asthma. He has clinically benefited from Symbicort. He tolerates the medication well. We will order through his pharmacy. I also would like for him to have albuterol to use as needed.

## 2016-12-09 NOTE — Assessment & Plan Note (Signed)
Good compliance, clinically benefiting from CPAP. Will continue same mask and pressures.

## 2017-01-18 ENCOUNTER — Telehealth: Payer: Self-pay | Admitting: Cardiovascular Disease

## 2017-01-18 NOTE — Telephone Encounter (Signed)
Left message with answers to her questions.

## 2017-01-18 NOTE — Telephone Encounter (Signed)
She wants to verify the diagnosis of pt as CHF or Heart Failure.Also needs his last Ejection Fraction. Please leave this on her confidential voicemail,if she is not available.

## 2017-03-04 ENCOUNTER — Other Ambulatory Visit: Payer: Self-pay

## 2017-03-04 MED ORDER — VALSARTAN 80 MG PO TABS: 80.0000 mg | ORAL_TABLET | Freq: Every day | ORAL | 3 refills | Status: DC

## 2017-03-18 ENCOUNTER — Other Ambulatory Visit: Payer: Self-pay | Admitting: Cardiology

## 2017-03-18 ENCOUNTER — Other Ambulatory Visit: Payer: Self-pay | Admitting: Cardiovascular Disease

## 2017-03-18 NOTE — Telephone Encounter (Signed)
Refill Request.  

## 2017-04-19 ENCOUNTER — Other Ambulatory Visit: Payer: Self-pay | Admitting: Cardiovascular Disease

## 2017-04-19 NOTE — Telephone Encounter (Signed)
Refill Request.  

## 2017-05-16 ENCOUNTER — Other Ambulatory Visit: Payer: Self-pay

## 2017-05-16 MED ORDER — AMIODARONE HCL 200 MG PO TABS: 200.0000 mg | ORAL_TABLET | Freq: Every day | ORAL | 0 refills | Status: DC

## 2017-06-03 ENCOUNTER — Other Ambulatory Visit: Payer: Self-pay | Admitting: Cardiovascular Disease

## 2017-06-03 NOTE — Telephone Encounter (Signed)
Refill Request.  

## 2017-06-07 ENCOUNTER — Encounter: Payer: Self-pay | Admitting: Emergency Medicine

## 2017-06-07 ENCOUNTER — Ambulatory Visit (INDEPENDENT_AMBULATORY_CARE_PROVIDER_SITE_OTHER): Payer: Medicare Other | Admitting: Emergency Medicine

## 2017-06-07 DIAGNOSIS — G4733 Obstructive sleep apnea (adult) (pediatric): Secondary | ICD-10-CM | POA: Diagnosis not present

## 2017-06-07 DIAGNOSIS — J45909 Unspecified asthma, uncomplicated: Secondary | ICD-10-CM | POA: Diagnosis not present

## 2017-06-07 MED ORDER — BUDESONIDE-FORMOTEROL FUMARATE 160-4.5 MCG/ACT IN AERO: 2.0000 | INHALATION_SPRAY | Freq: Every day | RESPIRATORY_TRACT | 6 refills | Status: DC

## 2017-06-07 NOTE — Assessment & Plan Note (Signed)
We will do a trial off of symbicort. Call us if you believe you miss this medication.  Continue to keep albuterol available to use 2 puffs up to every 4 hours if needed for shortness of breath.  Flu shot is up-to-date Follow with Dr Lamonte Sakai in 6 months or sooner if you have any problems

## 2017-06-07 NOTE — Assessment & Plan Note (Signed)
CPAP well.  Clinically benefiting.  Less daytime sleepiness.  More daytime energy.  Great compliance.

## 2017-06-07 NOTE — Patient Instructions (Addendum)
We will do a trial off of symbicort. Call us if you believe you miss this medication.  Continue to keep albuterol available to use 2 puffs up to every 4 hours if needed for shortness of breath.  Continue to use your CPAP reliably every night.  Follow with Dr Lamonte Sakai in 6 months or sooner if you have any problems

## 2017-06-07 NOTE — Progress Notes (Signed)
Subjective:    Patient ID: Eric Mayer, male    DOB: [birth year], 76 y.o.   MRN: [id number]  HPI 76 yo never smoker with A fib on amiodarone since 2 months ago, HTN + chronic diastolic CHF, prostate CA, remote sarcoma s/p excision and XRT, OSA on CPAP reliably, hx gastric bypass. He is under evaluation for progressive dyspnea, underwent a reassuring dobuta stress test  03/27/15, Echocardiogram 06/2015 with normal ejection fraction, mild aortic regurgitation and mitral regurgitation. He has been admitted in the past for acute on chronic dCHF, underwent diuresis and required cardioversion at that time 07/2015. Increased DOE overt the last 2 months, has been back in A fib.  PFTs were performed on 08/20/16 and I have reviewed. This shows a significantly truncated flow volume loop in a pattern suggestive of a fixed intrathoracic obstruction. The spirometry suggests severe obstructive disease, lung volumes are normal, DLCO is decreased and corrects to the normal range for alveolar volume.   He describes SOB w walking even when he is in NSR, affects him at about 100 ft. Occasionally has to stop to rest. No wheezing. No stridor. No cough. He notes that he is SOB when walking in swimming pool. He has experienced some dyspnea before when mowing and exposed to dust.   Past surgeries / intubations > gastric bypass, groin sarcoma, arm and leg surgeries x 3.   ROV 10/15/16 -- Never smoker with a history of atrial fibrillation on amiodarone, diastolic dysfunction, LEEP apnea on CPAP. I saw him for dyspnea in early February his spirometry was suggestive of a possible fixed intrathoracic obstruction. For this reason we Performed bronchoscopy on 09/21/16. This showed no evidence of a intrathoracic obstruction, tracheal abnormality. The vocal cords were normal. He is having nasal congestion and cough.   ROV 12/09/16 -- This follow-up visit for obstructive lung disease. I had suspected a possible intrathoracic obstruction  but there was no evidence for this on bronchoscopy. We decided to initiated and empiric trial of Symbicort. He returns to report that he has noticed that it has helped his breathing - he has been more active, up and around. Was able to walk on recent vacation, more that recent months. May have some throat irritation and hoarseness from the [facility]. Good compliance with his CPAP, feels that he is benefiting from it.   ROV 06/07/17 --patient has a history of obstructive sleep apnea on CPAP, obstructive lung disease noted on spirometry that I had suspected could be related to an intrathoracic fixed obstruction.  His bronchoscopy did not show any evidence for this.  He has been using Symbicort. He feels a bit better than last time, has missed the symbicort some, isn't sure that misses it. He is having some hoarse voice. Has used albuterol a few times.    Review of Systems  Constitutional: Negative.  Negative for fever and unexpected weight change.  HENT: Positive for rhinorrhea. Negative for congestion, dental problem, ear pain, nosebleeds, postnasal drip, sinus pressure, sneezing, sore throat and trouble swallowing.   Eyes: Negative.  Negative for redness and itching.  Respiratory: Positive for cough and shortness of breath. Negative for chest tightness and wheezing.   Cardiovascular: Positive for leg swelling. Negative for palpitations.  Gastrointestinal: Negative.  Negative for nausea and vomiting.  Genitourinary: Negative.  Negative for dysuria.  Musculoskeletal: Negative.  Negative for joint swelling.  Skin: Negative.  Negative for rash.  Neurological: Negative.  Negative for headaches.  Hematological: Negative.  Does not bruise/bleed easily.  Psychiatric/Behavioral: Negative for dysphoric mood. The patient is not nervous/anxious.     Past Medical History:  Diagnosis Date  . Anemia 1980s X 1  . Arthritis    "hands, knees, hips, ankles" (07/31/2015)  . Atrial flutter ([place]) 07/31/2015  . Chronic  diastolic heart failure ([place])    a. 11/2015: Echo w/ EF of 60-65%, no WMA, Grade 2 DD, trivial AR, ascending aorta mildly dilated.   . Depression   . History of cardiovascular stress test    a. 03/2015: Dobutamine Stress Echo with no evidence of ischemia.   [name] History of gastric bypass   . History of peptic ulcer    1980's  . History of radiation therapy 1993   sarcoma of left groin, tx at [facility]  . History of sarcoma    1993  LEFT GOIN--  S/P SURGERY, RADIATION AND CHEMO IN [place]  . Hypertension   . Hypogonadism in male   . Incomplete right bundle branch block   . 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 Nocturia   . OSA on CPAP   . PAF (paroxysmal atrial fibrillation) ([place])    prior CARDIOLOGIST-- DR Tressia Miners TURNER/   now monitored by dr Jenny Reichmann griffin  . Primary prostate adenocarcinoma ([place]) DX 07/08/14   Gleason 7,  stage T1c  . PVC's (premature ventricular contractions) 11/21/2015  . Sarcoma ([place]) ~ 1993/1994   "of groin"  . Weakness of left leg    SECONDARY TO SURGICAL NERVE INJURY OF LEFT GOIN  . Wears glasses      Family History  Problem Relation Age of Onset  . Liver disease Mother   . Renal Disease Mother   . Stroke Mother   . Atrial fibrillation Father   . Cancer Sister      Social History   Social History  . Marital status: Married    Spouse name: N/A  . Number of children: N/A  . Years of education: N/A   Occupational History  . Not on file.   Social History Main Topics  . Smoking status: Never Smoker  . Smokeless tobacco: Never Used  . Alcohol use No  . Drug use: No  . Sexual activity: Not Currently   Other Topics Concern  . Not on file   Social History Narrative  . 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  lived in [place], [place]; has lived around [place] Reactions  . Ace Inhibitors Other (See Comments)    cough  . Xarelto [Rivaroxaban] Other (See Comments)    Joint pain           Objective:   Physical Exam Vitals:   06/07/17 0903  BP: 124/76  Pulse: 83  SpO2: 94%  Weight: 292 lb (132.5 kg)  Height: 5\' 8"  (1.727 m)   Gen: Pleasant, overwt man, in no distress,  normal affect  ENT: No lesions,  mouth clear,  oropharynx clear, no postnasal drip  Neck: No JVD, no stridor  Lungs: No use of accessory muscles, clear without rales or rhonchi  Cardiovascular: RRR, heart sounds normal, no murmur or gallops, no peripheral edema  Musculoskeletal: No deformities, no cyanosis or clubbing  Neuro: alert, non focal  Skin: Warm, no lesions or rashes     Assessment & Plan:  Sleep apnea-on C-Pap CPAP well.  Clinically benefiting.  Less daytime sleepiness.  More daytime energy.  Great compliance.   Intrinsic asthma We will do a trial off of symbicort. Call us if you believe you miss this medication.  Continue to keep albuterol available to use 2 puffs up to every 4 hours if needed for shortness of breath.  Flu shot is up-to-date Follow with Dr Lamonte Sakai in 6 months or sooner if you have any problems   Baltazar Apo, MD, PhD 06/07/2017, 11:10 AM [facility] [phone] or if no answer [phone]

## 2017-06-23 ENCOUNTER — Ambulatory Visit: Payer: Medicare Other | Admitting: Cardiovascular Disease

## 2017-06-28 NOTE — Progress Notes (Signed)
Cardiology Office Note   Date:  06/29/2017   ID:  Eric Mayer, DOB Feb 11, 1941, MRN [id number]  PCP:  Eric Orn, MD  Cardiologist:   [name], MD   Chief Complaint  Patient presents with  . Follow-up     History of Present Illness: Eric Mayer is a 76 y.o. male with HTN, paroxysmal atrial fibrillation, chronic diastolic heart failure, OSA on CPAP, asthma, BPPV, and prostate adenocarcinoma here for follow up.  Eric Mayer initially presented 03/2015 with dyspnea on exertion and chronic lower extremity edema.  He had a dobutamine stress echo 03/27/15 that was negative for ischemia.  He had an echo 06/2015 that revealed LVEF 60-65% with mild aortic regurgitation and mitral regurgitation.  They were unable to evaluate diastolic function.  He was admitted 07/2015 for acute on chronic diastolic heart failure and diuresed with IV lasix. He also underwent DCCV.  Eric Mayer was seen in clinic 04/2016 and reported fatigue, shortness of breath, weight gain, and low blood pressures.  At that appointment he was switched from Lasix to Bumex and his valsartan was reduced. His weight is labile and fluctuates with dieteary changes.  He has been very responsive to extra doses of Bumex as needed.  Eric Mayer saw [name] 07/21/16 with increased dyspnea on exertion.  He was noted to be back in atrial fibrillation in the setting of not wearing his CPAP for the preceding week.  He was started on amiodarone 200mg  and metoprolol was reduced to 25mg  bid.  He converted back to sinus rhythm and diuresed 10 lb after returning to sinus rhythm.  Given that he was on amiodarone he was referred for PFTs 08/20/16 that revealed severe obstruction and likely moderate restriction.  There is also moderate diffusion defect.  He was referred to pulmonology and saw Dr. Louis Meckel. He underwent bronchoscopy on 09/21/16 which showed no intrathoracic obstruction or tracheal abnormality. He was started on Symbicort.   He was also referred to atrial fibrillation for consideration of alternatives to amiodarone. At this time there was not felt to be any pharmacologic alternatives to amiodarone. He was thought to be a poor candidate for ablation.    [name] has continued to struggle with exertional dyspnea  He saw Dr. Lamonte Sakai on 05/2017 and was started on Symbicort.  He didn't feel that it was helping so he stopped it.  The albuterol inhaler helps when he has flares of his breathing. Eric Mayer has started using a rolling walker with a seat because he sometiems has to stop and sit down.  He wants to start exercising more because he notes weakness in his lower legs.  He has been feeling up and down.  He was on vacation to [facility] last week. While on vacation he wasn't very careful about his diet had developed significant weight gain an edema.  He didn't have any extra bumex available.  He felt short of breath and bloated.  He hasn't noted any palpitations and doesn't think that he has been in atrial fibrillation.  His blood pressures typically in the 120s over 50s.  Past Medical History:  Diagnosis Date  . Anemia 1980s X 1  . Arthritis    "hands, knees, hips, ankles" (07/31/2015)  . Atrial flutter ([place]) 07/31/2015  . Chronic diastolic heart failure ([place])    a. 11/2015: Echo w/ EF of 60-65%, no WMA, Grade 2 DD, trivial AR, ascending aorta mildly dilated.   . Depression   .  History of cardiovascular stress test    a. 03/2015: Dobutamine Stress Echo with no evidence of ischemia.   [name] History of gastric bypass   . History of peptic ulcer    1980's  . History of radiation therapy 1993   sarcoma of left groin, tx at [facility]  . History of sarcoma    1993  LEFT GOIN--  S/P SURGERY, RADIATION AND CHEMO IN [place]  . Hypertension   . Hypogonadism in male   . Incomplete right bundle branch block   . Nerve injury    SURGICAL NERVE INJURY S/P  LEFT GOIN REMOVAL SARCOMA 1992--  RESIDUAL  WEAKNESS AND NUMBNESS UPPER LEFT LEG  . Nocturia   . OSA on CPAP   . PAF (paroxysmal atrial fibrillation) ([place])    prior CARDIOLOGIST-- DR Tressia Miners TURNER/   now monitored by dr Jenny Reichmann griffin  . Primary prostate adenocarcinoma ([place]) DX 07/08/14   Gleason 7,  stage T1c  . PVC's (premature ventricular contractions) 11/21/2015  . Sarcoma ([place]) ~ 1993/1994   "of groin"  . Weakness of left leg    SECONDARY TO SURGICAL NERVE INJURY OF LEFT GOIN  . Wears glasses     Past Surgical History:  Procedure Laterality Date  . CARDIAC CATHETERIZATION  08-12-2003  dr Tressia Miners turner   Normal coronary arteries, normal wall motion, no sig. abnormalities  . COLONOSCOPY  07-03-2002  . [place]  . FRACTURE SURGERY    . GROIN DISSECTION Left 1992   [place]   SARCOMA SURGERY  . INGUINAL HERNIA REPAIR Right 1950s?  . INTESTINAL BYPASS  1976   GASTRIC FOR OBESITY  . JOINT REPLACEMENT    . PATELLA FRACTURE SURGERY Left ~ 1995   "broke it twice; only had OR once"  . PROSTATE BIOPSY  06/2014  . TONSILLECTOMY  1950s  . TRANSTHORACIC ECHOCARDIOGRAM  08-08-2003   moderate LVH/  ef 55-65%/  mild MR/  moderate LAE/  trivial TR/  trivial pericardial effusion posterior to the heart  . VENTRAL HERNIA REPAIR  1977     Current Outpatient Medications  Medication Sig Dispense Refill  . acetaminophen (TYLENOL) 325 MG tablet Take 2 tablets (650 mg total) by mouth every 4 (four) hours as needed for headache or mild pain.    [name] albuterol (PROVENTIL HFA;VENTOLIN HFA) 108 (90 Base) MCG/ACT inhaler Inhale 2 puffs into the lungs every 6 (six) hours as needed for wheezing or shortness of breath. 3 Inhaler 1  . amiodarone (PACERONE) 200 MG tablet Take 1 tablet (200 mg total) by mouth daily. 90 tablet 0  . amphetamine-dextroamphetamine (ADDERALL XR) 20 MG 24 hr capsule Take 20 mg by mouth daily as needed (ADD).    [name] bumetanide (BUMEX) 2 MG tablet ONE TABLET BY MOUTH DAILY AND AN EXTRA TABLET FOR WEIGHT GAIN  2 LB DAILY OR 5 LB WEEKLY 180 tablet 3  . buPROPion (WELLBUTRIN XL) 300 MG 24 hr tablet Take 300 mg by mouth every morning.    . cholecalciferol (VITAMIN D) 1000 UNITS tablet Take 1,000 Units by mouth daily.    . Coenzyme Q10 (CO Q 10 PO) Take 300 mg by mouth every evening.     . Cyanocobalamin (VITAMIN B-12 IJ) Inject 1 mL as directed every 30 (thirty) days.     [name] diltiazem (TIAZAC) 180 MG 24 hr capsule Take 1 capsule (180 mg total) by mouth every morning. 14 capsule 1  . ELIQUIS 5 MG TABS tablet TAKE 1  TABLET BY MOUTH 2 TIMES DAILY. 180 tablet 2  . escitalopram (LEXAPRO) 10 MG tablet Take 10 mg by mouth daily.    . fluticasone (FLONASE) 50 MCG/ACT nasal spray Place 2 sprays into both nostrils daily. 16 g 5  . losartan (COZAAR) 100 MG tablet Take 100 mg daily by mouth.    . magnesium oxide (MAG-OX) 400 (241.3 Mg) MG tablet TAKE 1 TABLET (400 MG TOTAL) BY MOUTH DAILY. 90 tablet 1  . tamsulosin (FLOMAX) 0.4 MG CAPS capsule Take 2 capsules by mouth at bedtime.     No current facility-administered medications for this visit.     Allergies:   Ace inhibitors and Xarelto [rivaroxaban]    Social History:  The patient  reports that  has never smoked. he has never used smokeless tobacco. He reports that he does not drink alcohol or use drugs.   Family History:  The patient's family history includes Atrial fibrillation in his father; Cancer in his sister; Liver disease in his mother; Renal Disease in his mother; Stroke in his mother.    ROS:  Please see the history of present illness.   Otherwise, review of systems are positive for none.   All other systems are reviewed and negative.    PHYSICAL EXAM: VS:  BP 128/72   Pulse 83   Ht 5\' 8"  (1.727 m)   Wt 133.1 kg (293 lb 6.4 oz)   BMI 44.61 kg/m  , BMI Body mass index is 44.61 kg/m. GENERAL:  Well appearing HEENT: Pupils equal round and reactive, fundi not visualized, oral mucosa unremarkable NECK:  JVP 2cm above clavicle at 45 degrees.  waveform within normal limits, carotid upstroke brisk and symmetric, no bruits, no thyromegaly LYMPHATICS:  No cervical adenopathy LUNGS:  Clear to auscultation bilaterally HEART:  RRR.  PMI not displaced or sustained,S1 and S2 within normal limits, no S3, no S4, no clicks, no rubs, no murmurs ABD:  Flat, positive bowel sounds normal in frequency in pitch, no bruits, no rebound, no guarding, no midline pulsatile mass, no hepatomegaly, no splenomegaly EXT:  2 plus pulses throughout, 1+ pitting edema to upper tibia bilaterally, no cyanosis no clubbing SKIN:  No rashes no nodules NEURO:  Cranial nerves II through XII grossly intact, motor grossly intact throughout PSYCH:  Cognitively intact, oriented to person place and time   EKG:  EKG is not ordered today.   04/22/16: Sinus rhythm rate 67 bpm.  RSR' in V1. 05/20/16: Sinus rhythm.  Rate 75 bpm.  PACs.  06/29/17: Sinus rhythm.  Rate 83 bpm.  IVCD.   Echo 12/11/15: Study Conclusions  - Left ventricle: The cavity size was normal. There was severe   focal basal hypertrophy of the septum. Systolic function was   normal. The estimated ejection fraction was in the range of 60%   to 65%. Wall motion was normal; there were no regional wall   motion abnormalities. Features are consistent with a pseudonormal   left ventricular filling pattern, with concomitant abnormal   relaxation and increased filling pressure (grade 2 diastolic   dysfunction). - Aortic valve: Trileaflet; mildly thickened, mildly calcified   leaflets. There was trivial regurgitation. - Aorta: Aortic root dimension: 41 mm (ED). - Ascending aorta: The ascending aorta was mildly dilated. - Left atrium: The atrium was moderately dilated.   Anterior-posterior dimension: 55 mm. Volume/bsa, ES, (1-plane   Simpson&'s, A2C): 40.2 ml/m^2.  Echo 07/08/15: Study Conclusions  - Left ventricle: The cavity size was normal. Wall thickness was  increased in a pattern of moderate LVH.  Systolic function was normal. The estimated ejection fraction was in the range of 60% to 65%. The study is not technically sufficient to allow evaluation of LV diastolic function. - Aortic valve: Trileaflet. Sclerosis without stenosis. There was mild regurgitation. - Mitral valve: Mildly thickened leaflets . There was mild regurgitation. - Left atrium: Moderately dilated at 43 ml/m2. - Right atrium: The atrium was mildly dilated. - Tricuspid valve: There was mild regurgitation. - Pulmonary arteries: PA peak pressure: 31 mm Hg (S). - Inferior vena cava: The vessel was normal in size. The respirophasic diameter changes were in the normal range (= 50%), consistent with normal central venous pressure.  Impressions:  - LVEF 60-65%, moderate LVH, normal wall motion, aortic valve sclerosis with mild AI, MR and TR, RVSP 31 mmHg., moderate LAE, mild RAE.   Recent Labs: 08/11/2016: ALT 14; BUN 28; Creat 1.23; Potassium 4.8; Sodium 146; TSH 3.20    Lipid Panel No results found for: CHOL, TRIG, HDL, CHOLHDL, VLDL, LDLCALC, LDLDIRECT    Wt Readings from Last 3 Encounters:  06/29/17 133.1 kg (293 lb 6.4 oz)  06/07/17 132.5 kg (292 lb)  12/09/16 130.6 kg (288 lb)     Other studies Reviewed: Additional studies/ records that were reviewed today include: n/a  ASSESSMENT AND PLAN:  # Atrial fibrillation/flutter: # PVCs: Eric Mayer remains in sinus rhythm.  He did not tolerate being atrial fibrillation.  He converted to sinus rhythm with amiodarone.  Unfortunately there are no other antiarrhythmic options and he is not a candidate for ablation. Continue amiodarone, metoprolol, diltiazem and Eliquis.  Check TSH and CMP today.   # Hypertension:  Blood pressure is well-controlled on repeat.  Continue diltiazem, metoprolol, and losartan.   # Acute on chronic diastolic heart failure:  Mr. Pautsch is volume overloaded.  Take an extra dose of bumex for 2 days.  Continue BP  control as above.     This patients CHA2DS2-VASc Score and unadjusted Ischemic Stroke Rate (% per year) is equal to 2.2 % stroke rate/year from a score of 2  Above score calculated as 1 point each if present [CHF, HTN, DM, Vascular=MI/PAD/Aortic Plaque, Age if 65-74, or Male]  Above score calculated as 2 points each if present [Age > 75, or Stroke/TIA/TE]   Current medicines are reviewed at length with the patient today.  The patient does not have concerns regarding medicines.  The following changes have been made:  None  Labs/ tests ordered today include:  Orders Placed This Encounter  Procedures  . TSH  . Comprehensive metabolic panel  . EKG 12-Lead     Disposition:   FU with Dr. Oval Linsey in 4 months   Signed, [name], MD  06/29/2017 9:01 AM    [place]

## 2017-06-29 ENCOUNTER — Ambulatory Visit (INDEPENDENT_AMBULATORY_CARE_PROVIDER_SITE_OTHER): Payer: Medicare Other | Admitting: Cardiovascular Disease

## 2017-06-29 ENCOUNTER — Encounter: Payer: Self-pay | Admitting: Cardiovascular Disease

## 2017-06-29 VITALS — BP 128/72 | HR 83 | Ht 68.0 in | Wt 293.4 lb

## 2017-06-29 DIAGNOSIS — I480 Paroxysmal atrial fibrillation: Secondary | ICD-10-CM

## 2017-06-29 DIAGNOSIS — Z5181 Encounter for therapeutic drug level monitoring: Secondary | ICD-10-CM | POA: Diagnosis not present

## 2017-06-29 DIAGNOSIS — I10 Essential (primary) hypertension: Secondary | ICD-10-CM

## 2017-06-29 DIAGNOSIS — I5033 Acute on chronic diastolic (congestive) heart failure: Secondary | ICD-10-CM | POA: Diagnosis not present

## 2017-06-29 LAB — COMPREHENSIVE METABOLIC PANEL
ALT: 21 IU/L (ref 0–44)
AST: 16 IU/L (ref 0–40)
Albumin/Globulin Ratio: 2.1 (ref 1.2–2.2)
Albumin: 4 g/dL (ref 3.5–4.8)
Alkaline Phosphatase: 125 IU/L — ABNORMAL HIGH (ref 39–117)
BUN/Creatinine Ratio: 21 (ref 10–24)
BUN: 19 mg/dL (ref 8–27)
Bilirubin Total: 0.6 mg/dL (ref 0.0–1.2)
CO2: 22 mmol/L (ref 20–29)
Calcium: 8.9 mg/dL (ref 8.6–10.2)
Chloride: 109 mmol/L — ABNORMAL HIGH (ref 96–106)
Creatinine, Ser: 0.89 mg/dL (ref 0.76–1.27)
GFR calc Af Amer: 97 mL/min/{1.73_m2} (ref >59)
GFR calc non Af Amer: 84 mL/min/{1.73_m2} (ref >59)
Globulin, Total: 1.9 g/dL (ref 1.5–4.5)
Glucose: 91 mg/dL (ref 65–99)
Potassium: 4.3 mmol/L (ref 3.5–5.2)
Sodium: 145 mmol/L — ABNORMAL HIGH (ref 134–144)
Total Protein: 5.9 g/dL — ABNORMAL LOW (ref 6.0–8.5)

## 2017-06-29 LAB — TSH: TSH: 3.8 u[IU]/mL (ref 0.450–4.500)

## 2017-06-29 NOTE — Patient Instructions (Signed)
Medication Instructions:  TAKE AN EXTRA BUMEX TODAY AND TOMORROW  Labwork: CMET/TSH TODAY   Testing/Procedures: NONE  Follow-Up: Your physician recommends that you schedule a follow-up appointment in: [place]  If you need a refill on your cardiac medications before your next appointment, please call your pharmacy.

## 2017-08-01 ENCOUNTER — Other Ambulatory Visit: Payer: Self-pay | Admitting: Cardiovascular Disease

## 2017-08-01 NOTE — Telephone Encounter (Signed)
Please review for refill. Thanks!  

## 2017-08-10 ENCOUNTER — Other Ambulatory Visit: Payer: Self-pay | Admitting: Cardiovascular Disease

## 2017-08-10 NOTE — Telephone Encounter (Signed)
Please review for refill, Thanks !  

## 2017-09-19 ENCOUNTER — Other Ambulatory Visit: Payer: Self-pay | Admitting: Internal Medicine

## 2017-09-19 DIAGNOSIS — K635 Polyp of colon: Secondary | ICD-10-CM

## 2017-10-03 ENCOUNTER — Other Ambulatory Visit: Payer: Self-pay | Admitting: Cardiovascular Disease

## 2017-10-03 NOTE — Telephone Encounter (Signed)
Refill Request.  

## 2017-10-10 ENCOUNTER — Other Ambulatory Visit: Payer: Self-pay | Admitting: Internal Medicine

## 2017-10-10 DIAGNOSIS — Z860100 Personal history of colon polyps, unspecified: Secondary | ICD-10-CM

## 2017-10-10 DIAGNOSIS — Q438 Other specified congenital malformations of intestine: Secondary | ICD-10-CM

## 2017-10-21 ENCOUNTER — Ambulatory Visit
Admission: RE | Admit: 2017-10-21 | Discharge: 2017-10-21 | Disposition: A | Discharge status: Home / Self Care | Payer: Medicare Other | Source: Ambulatory Visit | Attending: Internal Medicine | Admitting: Internal Medicine

## 2017-10-21 ENCOUNTER — Other Ambulatory Visit: Payer: Medicare Other

## 2017-10-21 DIAGNOSIS — Z860100 Personal history of colon polyps, unspecified: Secondary | ICD-10-CM

## 2017-10-21 DIAGNOSIS — Q438 Other specified congenital malformations of intestine: Secondary | ICD-10-CM

## 2017-10-24 ENCOUNTER — Telehealth: Payer: Self-pay | Admitting: Cardiovascular Disease

## 2017-10-24 NOTE — Telephone Encounter (Signed)
SPOKE TO PATIENT. PATIENT STATES HE HAD A  COLONOSCOPY ON Friday. STARTING THIS PAST Saturday - [facility] [facility] . " I FEEL LIKE I AM OUT OF RHYTHM SINCE SATURDAY"  APPOINTMENT SCHEDULE FOR TOMORROWS WITH DR [place].

## 2017-10-24 NOTE — Telephone Encounter (Signed)
Patient calling, states that he has been having AFIB symptoms since Saturday.

## 2017-10-25 ENCOUNTER — Encounter: Payer: Self-pay | Admitting: Cardiovascular Disease

## 2017-10-25 ENCOUNTER — Ambulatory Visit (INDEPENDENT_AMBULATORY_CARE_PROVIDER_SITE_OTHER): Payer: Medicare Other | Admitting: Cardiovascular Disease

## 2017-10-25 VITALS — BP 110/64 | HR 115 | Ht 68.0 in | Wt 291.0 lb

## 2017-10-25 DIAGNOSIS — I5033 Acute on chronic diastolic (congestive) heart failure: Secondary | ICD-10-CM | POA: Diagnosis not present

## 2017-10-25 DIAGNOSIS — I481 Persistent atrial fibrillation: Secondary | ICD-10-CM | POA: Diagnosis not present

## 2017-10-25 DIAGNOSIS — I480 Paroxysmal atrial fibrillation: Secondary | ICD-10-CM

## 2017-10-25 DIAGNOSIS — I493 Ventricular premature depolarization: Secondary | ICD-10-CM | POA: Diagnosis not present

## 2017-10-25 DIAGNOSIS — I4819 Other persistent atrial fibrillation: Secondary | ICD-10-CM

## 2017-10-25 MED ORDER — METOPROLOL TARTRATE 25 MG PO TABS: ORAL_TABLET | ORAL | 1 refills | Status: DC

## 2017-10-25 NOTE — H&P (View-Only) (Signed)
Cardiology Office Note   Date:  10/25/2017   ID:  Eric Mayer, DOB 06/02/1941, MRN [id number]  PCP:  [name], MD  Cardiologist:   [name], MD   Chief Complaint  Patient presents with  . Follow-up    4 months  . Shortness of Breath  . Headache     History of Present Illness: Eric Mayer is a 76 y.o. male with HTN, paroxysmal atrial fibrillation, chronic diastolic heart failure, OSA on CPAP, asthma, BPPV, and prostate adenocarcinoma here for follow up.  Eric Mayer initially presented 03/2015 with dyspnea on exertion and chronic lower extremity edema.  He had a dobutamine stress echo 03/27/15 that was negative for ischemia.  He had an echo 06/2015 that revealed LVEF 60-65% with mild aortic regurgitation and mitral regurgitation.  They were unable to evaluate diastolic function.  He was admitted 07/2015 for acute on chronic diastolic heart failure and diuresed with IV lasix. He also underwent DCCV.  Eric Mayer was seen in clinic 04/2016 and reported fatigue, shortness of breath, weight gain, and low blood pressures.  At that appointment he was switched from Lasix to Bumex and his valsartan was reduced. His weight is labile and fluctuates with dieteary changes.  He has been very responsive to extra doses of Bumex as needed.  Eric Mayer saw [name] 07/21/16 with increased dyspnea on exertion.  He was noted to be back in atrial fibrillation in the setting of not wearing his CPAP for the preceding week.  He was started on amiodarone 200mg  and metoprolol was reduced to 25mg  bid.  He converted back to sinus rhythm and diuresed 10 lb after returning to sinus rhythm.  Given that he was on amiodarone he was referred for PFTs 08/20/16 that revealed severe obstruction and likely moderate restriction.  There is also moderate diffusion defect.  He was referred to pulmonology and saw Dr. Louis Meckel. He underwent bronchoscopy on 09/21/16 which showed no intrathoracic obstruction or  tracheal abnormality. He was started on Symbicort.  He was also referred to atrial fibrillation for consideration of alternatives to amiodarone. At this time there was not felt to be any pharmacologic alternatives to amiodarone. He was thought to be a poor candidate for ablation.    [name] was doing well and exercising regularly.  However, two days ago he went back into atrial fibrillation.  When he awakened in the morning he felt poorly.  He walked into water aerobics class and felt his heart go out of rhythm.  He felt very short of breath and his heart was pounding.  His heart rate has been in the 100s since that time.  He feels poorly.  His blood pressures ranged from the 90s-110s.  Of note, he had a virtual colonoscopy the preceding day.  He continues to have intermittent LE edema that responds to extra bumex.  His weight is up 4lb from his baseline.  He did not miss any doses of Eliquis for colonoscopy but forgot to take it twice in the last two weeks.     Past Medical History:  Diagnosis Date  . Anemia 1980s X 1  . Arthritis    "hands, knees, hips, ankles" (07/31/2015)  . Atrial flutter ([place]) 07/31/2015  . Chronic diastolic heart failure ([place])    a. 11/2015: Echo w/ EF of 60-65%, no WMA, Grade 2 DD, trivial AR, ascending aorta mildly dilated.   . Depression   . History of cardiovascular  stress test    a. 03/2015: Dobutamine Stress Echo with no evidence of ischemia.   [name] History of gastric bypass   . History of peptic ulcer    1980's  . History of radiation therapy 1993   sarcoma of left groin, tx at [facility]  . History of sarcoma    1993  LEFT GOIN--  S/P SURGERY, RADIATION AND CHEMO IN [place]  . Hypertension   . Hypogonadism in male   . Incomplete right bundle branch block   . 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 Nocturia   . OSA on CPAP   . PAF (paroxysmal  atrial fibrillation) ([place])    prior CARDIOLOGIST-- DR Tressia Miners TURNER/   now monitored by dr Jenny Reichmann griffin  . Primary prostate adenocarcinoma ([place]) DX 07/08/14   Gleason 7,  stage T1c  . PVC's (premature ventricular contractions) 11/21/2015  . Sarcoma ([place]) ~ 1993/1994   "of groin"  . Weakness of left leg    SECONDARY TO SURGICAL NERVE INJURY OF LEFT GOIN  . Wears glasses     Past Surgical History:  Procedure Laterality Date  . CARDIAC CATHETERIZATION  08-12-2003  dr Tressia Miners turner   Normal coronary arteries, normal wall motion, no sig. abnormalities  . CARDIOVERSION N/A 08/04/2015   Procedure: CARDIOVERSION;  Surgeon: [name], MD;  Location: [place];  Service: Cardiovascular;  Laterality: N/A;  . COLONOSCOPY  07-03-2002  . [place]  . FRACTURE SURGERY    . GROIN DISSECTION Left 1992   [place]   SARCOMA SURGERY  . INGUINAL HERNIA REPAIR Right 1950s?  . INTESTINAL BYPASS  1976   GASTRIC FOR OBESITY  . JOINT REPLACEMENT    . PATELLA FRACTURE SURGERY Left ~ 1995   "broke it twice; only had OR once"  . PROSTATE BIOPSY  06/2014  . RADIOACTIVE SEED IMPLANT N/A 10/17/2014   Procedure: RADIOACTIVE SEED IMPLANT    ;  Surgeon: Ailene Rud, MD;  Location: [facility];  Service: Urology;  Laterality: N/A;   70 SEEDS IMPLANTED   . TONSILLECTOMY  1950s  . TOTAL KNEE ARTHROPLASTY Right 12/17/2014   Procedure: TOTAL KNEE ARTHROPLASTY;  Surgeon: Melrose Nakayama, MD;  Location: [place];  Service: Orthopedics;  Laterality: Right;  . TRANSTHORACIC ECHOCARDIOGRAM  08-08-2003   moderate LVH/  ef 55-65%/  mild MR/  moderate LAE/  trivial TR/  trivial pericardial effusion posterior to the heart  . [place]  . VIDEO BRONCHOSCOPY Bilateral 09/21/2016   Procedure: VIDEO BRONCHOSCOPY WITHOUT FLUORO;  Surgeon: Collene Gobble, MD;  Location: [place];  Service: Cardiopulmonary;  Laterality: Bilateral;     Current Outpatient  Medications  Medication Sig Dispense Refill  . acetaminophen (TYLENOL) 325 MG tablet Take 2 tablets (650 mg total) by mouth every 4 (four) hours as needed for headache or mild pain.    [name] amiodarone (PACERONE) 200 MG tablet TAKE 1 TABLET BY MOUTH DAILY. 90 tablet 3  . bumetanide (BUMEX) 2 MG tablet TAKE 1 TABLET BY MOUTH DAILY AND AN EXTRA TABLET FOR WEIGHT GAIN 2 POUNDS DAILY OR 5 POUNDS WEEKLY 180 tablet 3  . buPROPion (WELLBUTRIN XL) 300 MG 24 hr tablet Take 300 mg by mouth every morning.    . cholecalciferol (VITAMIN D) 1000 UNITS tablet Take 1,000 Units by mouth daily.    . Coenzyme  Q10 (CO Q 10 PO) Take 300 mg by mouth every evening.     . Cyanocobalamin (VITAMIN B-12 IJ) Inject 1 mL as directed every 30 (thirty) days.     [name] diltiazem (TIAZAC) 180 MG 24 hr capsule Take 1 capsule (180 mg total) by mouth every morning. 14 capsule 1  . ELIQUIS 5 MG TABS tablet TAKE 1 TABLET BY MOUTH 2 TIMES DAILY. 180 tablet 2  . escitalopram (LEXAPRO) 10 MG tablet Take 10 mg by mouth daily.    [name] losartan (COZAAR) 100 MG tablet Take 100 mg daily by mouth.    . magnesium oxide (MAG-OX) 400 (241.3 Mg) MG tablet TAKE 1 TABLET (400 MG TOTAL) BY MOUTH DAILY. 90 tablet 1  . metoprolol tartrate (LOPRESSOR) 25 MG tablet TAKE 1 TABLET BY MOUTH 2 TIMES DAILY. 180 tablet 1  . tamsulosin (FLOMAX) 0.4 MG CAPS capsule Take 2 capsules by mouth at bedtime.     No current facility-administered medications for this visit.     Allergies:   Ace inhibitors and Xarelto [rivaroxaban]    Social History:  The patient  reports that  has never smoked. he has never used smokeless tobacco. He reports that he does not drink alcohol or use drugs.   Family History:  The patient's family history includes Atrial fibrillation in his father; Cancer in his sister; Liver disease in his mother; Renal Disease in his mother; Stroke in his mother.    ROS:  Please see the history of present illness.   Otherwise, review of systems are positive  for none.   All other systems are reviewed and negative.    PHYSICAL EXAM: VS:  BP 110/64   Pulse (!) 115   Ht 5\' 8"  (1.727 m)   Wt 291 lb (132 kg)   BMI 44.25 kg/m  , BMI Body mass index is 44.25 kg/m. GENERAL:  Well appearing HEENT: Pupils equal round and reactive, fundi not visualized, oral mucosa unremarkable NECK:  JVP 1cm above clavicle sitting upright. Waveform within normal limits, carotid upstroke brisk and symmetric, no bruits LUNGS:  Clear to auscultation bilaterally HEART:  Tachycardic.  Irregularly irregular.  PMI not displaced or sustained,S1 and S2 within normal limits, no S3, no S4, no clicks, no rubs, no murmurs ABD:  Flat, positive bowel sounds normal in frequency in pitch, no bruits, no rebound, no guarding, no midline pulsatile mass, no hepatomegaly, no splenomegaly EXT:  2 plus pulses throughout, 1+ pitting edema to upper tibia bilaterally, no cyanosis no clubbing SKIN:  No rashes no nodules NEURO:  Cranial nerves II through XII grossly intact, motor grossly intact throughout PSYCH:  Cognitively intact, oriented to person place and time   EKG:  EKG is ordered today.   04/22/16: Sinus rhythm rate 67 bpm.  RSR' in V1. 05/20/16: Sinus rhythm.  Rate 75 bpm.  PACs.  06/29/17: Sinus rhythm.  Rate 83 bpm.  IVCD.  10/25/17: Atrial fibrillation.  Rate 115 bpm.  LAFB.    Echo 12/11/15: Study Conclusions  - Left ventricle: The cavity size was normal. There was severe   focal basal hypertrophy of the septum. Systolic function was   normal. The estimated ejection fraction was in the range of 60%   to 65%. Wall motion was normal; there were no regional wall   motion abnormalities. Features are consistent with a pseudonormal   left ventricular filling pattern, with concomitant abnormal   relaxation and increased filling pressure (grade 2 diastolic   dysfunction). - Aortic valve:  Trileaflet; mildly thickened, mildly calcified   leaflets. There was trivial regurgitation. -  Aorta: Aortic root dimension: 41 mm (ED). - Ascending aorta: The ascending aorta was mildly dilated. - Left atrium: The atrium was moderately dilated.   Anterior-posterior dimension: 55 mm. Volume/bsa, ES, (1-plane   Simpson&'s, A2C): 40.2 ml/m^2.  Echo 07/08/15: Study Conclusions  - Left ventricle: The cavity size was normal. Wall thickness was increased in a pattern of moderate LVH. Systolic function was normal. The estimated ejection fraction was in the range of 60% to 65%. The study is not technically sufficient to allow evaluation of LV diastolic function. - Aortic valve: Trileaflet. Sclerosis without stenosis. There was mild regurgitation. - Mitral valve: Mildly thickened leaflets . There was mild regurgitation. - Left atrium: Moderately dilated at 43 ml/m2. - Right atrium: The atrium was mildly dilated. - Tricuspid valve: There was mild regurgitation. - Pulmonary arteries: PA peak pressure: 31 mm Hg (S). - Inferior vena cava: The vessel was normal in size. The respirophasic diameter changes were in the normal range (= 50%), consistent with normal central venous pressure.  Impressions:  - LVEF 60-65%, moderate LVH, normal wall motion, aortic valve sclerosis with mild AI, MR and TR, RVSP 31 mmHg., moderate LAE, mild RAE.   Recent Labs: 06/29/2017: ALT 21; BUN 19; Creatinine, Ser 0.89; Potassium 4.3; Sodium 145; TSH 3.800    Lipid Panel No results found for: CHOL, TRIG, HDL, CHOLHDL, VLDL, LDLCALC, LDLDIRECT    Wt Readings from Last 3 Encounters:  10/25/17 291 lb (132 kg)  06/29/17 293 lb 6.4 oz (133.1 kg)  06/07/17 292 lb (132.5 kg)     Other studies Reviewed: Additional studies/ records that were reviewed today include: n/a  ASSESSMENT AND PLAN:  # Atrial fibrillation/flutter: # PVCs: Eric Mayer is back in atrial fibrillation and feeling poorly.  His blood pressure has been low so we cannot titrate his nodal agents.  Continue  amiodarone, metoprolol, and diltiazem.  Unfortunately he missed 2 doses of Eliquis.  Therefore he will need TEE prior to cardioversion.  We have arranged for this to happen 3/15.  He will reduce his losartan to 50 mg daily so that metoprolol can be increased to 50 mg twice daily in the interim.  This can be changed back once he is back in sinus rhythm.  This patients CHA2DS2-VASc Score and unadjusted Ischemic Stroke Rate (% per year) is equal to 2.2 % stroke rate/year from a score of 2  Above score calculated as 1 point each if present [CHF, HTN, DM, Vascular=MI/PAD/Aortic Plaque, Age if 65-74, or Male]  Above score calculated as 2 points each if present [Age > 75, or Stroke/TIA/TE]  # Hypertension:  Blood pressure stable.  Temporarily reduce losartan to 50mg  and increase metoprolol as above.    # Acute on chronic diastolic heart failure:  Eric Mayer is volume overloaded.  He was doing well before going into atrial fibrillation.  Take an extra dose of bumex for 2 days.  Continue BP control as above.      Current medicines are reviewed at length with the patient today.  The patient does not have concerns regarding medicines.  The following changes have been made: Reduce losartan and increase metoprolol  Labs/ tests ordered today include:  Orders Placed This Encounter  Procedures  . CBC with Differential/Platelet  . Basic metabolic panel  . Magnesium  . EKG 12-Lead     Disposition:   FU with Dr. Oval Linsey in 4 weeks.  Signed, [name], MD  10/25/2017 5:21 PM    [place]

## 2017-10-25 NOTE — Patient Instructions (Addendum)
Medication Instructions:  DECREASE YOUR LOSARTAN TO 1/2 YOUR USUAL DOSE AND DOUBLE YOUR METOPROLOL DOSE UNTIL AFTER THE PROCEDURE   Labwork: BMET/MAGNESIUM/CBC TODAY   Testing/Procedures: Your physician has requested that you have a TEE/Cardioversion. During a TEE, sound waves are used to create images of your heart. It provides your doctor with information about the size and shape of your heart and how well your heart's chambers and valves are working. In this test, a transducer is attached to the end of a flexible tube that is guided down you throat and into your esophagus (the tube leading from your mouth to your stomach) to get a more detailed image of your heart. Once the TEE has determined that a blood clot is not present, the cardioversion begins. Electrical Cardioversion uses a jolt of electricity to your heart either through paddles or wired patches attached to your chest. This is a controlled, usually prescheduled, procedure. This procedure is done at the hospital and you are not awake during the procedure. You usually go home the day of the procedure. Please see the instruction sheet given to you today for more information.  Follow-Up: Your physician recommends that you schedule a follow-up appointment in: 4-8 WEEKS  If you need a refill on your cardiac medications before your next appointment, please call your pharmacy.   You are scheduled for a TEE Cardioversion on 10/28/17 with Dr. Aundra Dubin.  Please arrive at the [facility] (Main Entrance A) at [facility]: [address] [place], [place] [address] at 6:45 am.   DIET: Nothing to eat or drink after midnight except a sip of water with medications (see medication instructions below)  Medication Instructions: Continue your anticoagulant ELIQUIS  You will need to continue your anticoagulant after your procedure until you are told by your  Provider that it is safe to stop  You must have a responsible person to drive you home and  stay in the waiting area during your procedure. Failure to do so could result in cancellation.  Bring your insurance cards.  *Special Note: Every effort is made to have your procedure done on time. Occasionally there are emergencies that occur at the hospital that may cause delays. Please be patient if a delay does occur.

## 2017-10-25 NOTE — Progress Notes (Signed)
Cardiology Office Note   Date:  10/25/2017   ID:  Eric Mayer, DOB 08/12/1941, MRN [id number]  PCP:  [name], MD  Cardiologist:   [name], MD   Chief Complaint  Patient presents with  . Follow-up    4 months  . Shortness of Breath  . Headache     History of Present Illness: Eric Mayer is a 77 y.o. male with HTN, paroxysmal atrial fibrillation, chronic diastolic heart failure, OSA on CPAP, asthma, BPPV, and prostate adenocarcinoma here for follow up.  Eric Mayer initially presented 03/2015 with dyspnea on exertion and chronic lower extremity edema.  He had a dobutamine stress echo 03/27/15 that was negative for ischemia.  He had an echo 06/2015 that revealed LVEF 60-65% with mild aortic regurgitation and mitral regurgitation.  They were unable to evaluate diastolic function.  He was admitted 07/2015 for acute on chronic diastolic heart failure and diuresed with IV lasix. He also underwent DCCV.  Eric Mayer was seen in clinic 04/2016 and reported fatigue, shortness of breath, weight gain, and low blood pressures.  At that appointment he was switched from Lasix to Bumex and his valsartan was reduced. His weight is labile and fluctuates with dieteary changes.  He has been very responsive to extra doses of Bumex as needed.  Eric Mayer saw [name] 07/21/16 with increased dyspnea on exertion.  He was noted to be back in atrial fibrillation in the setting of not wearing his CPAP for the preceding week.  He was started on amiodarone 200mg  and metoprolol was reduced to 25mg  bid.  He converted back to sinus rhythm and diuresed 10 lb after returning to sinus rhythm.  Given that he was on amiodarone he was referred for PFTs 08/20/16 that revealed severe obstruction and likely moderate restriction.  There is also moderate diffusion defect.  He was referred to pulmonology and saw Dr. Louis Meckel. He underwent bronchoscopy on 09/21/16 which showed no intrathoracic obstruction or  tracheal abnormality. He was started on Symbicort.  He was also referred to atrial fibrillation for consideration of alternatives to amiodarone. At this time there was not felt to be any pharmacologic alternatives to amiodarone. He was thought to be a poor candidate for ablation.    [name] was doing well and exercising regularly.  However, two days ago he went back into atrial fibrillation.  When he awakened in the morning he felt poorly.  He walked into water aerobics class and felt his heart go out of rhythm.  He felt very short of breath and his heart was pounding.  His heart rate has been in the 100s since that time.  He feels poorly.  His blood pressures ranged from the 90s-110s.  Of note, he had a virtual colonoscopy the preceding day.  He continues to have intermittent LE edema that responds to extra bumex.  His weight is up 4lb from his baseline.  He did not miss any doses of Eliquis for colonoscopy but forgot to take it twice in the last two weeks.     Past Medical History:  Diagnosis Date  . Anemia 1980s X 1  . Arthritis    "hands, knees, hips, ankles" (07/31/2015)  . Atrial flutter ([place]) 07/31/2015  . Chronic diastolic heart failure ([place])    a. 11/2015: Echo w/ EF of 60-65%, no WMA, Grade 2 DD, trivial AR, ascending aorta mildly dilated.   . Depression   . History of cardiovascular  stress test    a. 03/2015: Dobutamine Stress Echo with no evidence of ischemia.   [name] History of gastric bypass   . History of peptic ulcer    1980's  . History of radiation therapy 1993   sarcoma of left groin, tx at [facility]  . History of sarcoma    1993  LEFT GOIN--  S/P SURGERY, RADIATION AND CHEMO IN [place]  . Hypertension   . Hypogonadism in male   . Incomplete right bundle branch block   . 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 Nocturia   . OSA on CPAP   . PAF (paroxysmal  atrial fibrillation) ([place])    prior CARDIOLOGIST-- DR Tressia Miners TURNER/   now monitored by dr Jenny Reichmann griffin  . Primary prostate adenocarcinoma ([place]) DX 07/08/14   Gleason 7,  stage T1c  . PVC's (premature ventricular contractions) 11/21/2015  . Sarcoma ([place]) ~ 1993/1994   "of groin"  . Weakness of left leg    SECONDARY TO SURGICAL NERVE INJURY OF LEFT GOIN  . Wears glasses     Past Surgical History:  Procedure Laterality Date  . CARDIAC CATHETERIZATION  08-12-2003  dr Tressia Miners turner   Normal coronary arteries, normal wall motion, no sig. abnormalities  . CARDIOVERSION N/A 08/04/2015   Procedure: CARDIOVERSION;  Surgeon: [name], MD;  Location: [place];  Service: Cardiovascular;  Laterality: N/A;  . COLONOSCOPY  07-03-2002  . [place]  . FRACTURE SURGERY    . GROIN DISSECTION Left 1992   [place]   SARCOMA SURGERY  . INGUINAL HERNIA REPAIR Right 1950s?  . INTESTINAL BYPASS  1976   GASTRIC FOR OBESITY  . JOINT REPLACEMENT    . PATELLA FRACTURE SURGERY Left ~ 1995   "broke it twice; only had OR once"  . PROSTATE BIOPSY  06/2014  . RADIOACTIVE SEED IMPLANT N/A 10/17/2014   Procedure: RADIOACTIVE SEED IMPLANT    ;  Surgeon: Ailene Rud, MD;  Location: [facility];  Service: Urology;  Laterality: N/A;   70 SEEDS IMPLANTED   . TONSILLECTOMY  1950s  . TOTAL KNEE ARTHROPLASTY Right 12/17/2014   Procedure: TOTAL KNEE ARTHROPLASTY;  Surgeon: Melrose Nakayama, MD;  Location: [place];  Service: Orthopedics;  Laterality: Right;  . TRANSTHORACIC ECHOCARDIOGRAM  08-08-2003   moderate LVH/  ef 55-65%/  mild MR/  moderate LAE/  trivial TR/  trivial pericardial effusion posterior to the heart  . [place]  . VIDEO BRONCHOSCOPY Bilateral 09/21/2016   Procedure: VIDEO BRONCHOSCOPY WITHOUT FLUORO;  Surgeon: Collene Gobble, MD;  Location: [place];  Service: Cardiopulmonary;  Laterality: Bilateral;     Current Outpatient  Medications  Medication Sig Dispense Refill  . acetaminophen (TYLENOL) 325 MG tablet Take 2 tablets (650 mg total) by mouth every 4 (four) hours as needed for headache or mild pain.    [name] amiodarone (PACERONE) 200 MG tablet TAKE 1 TABLET BY MOUTH DAILY. 90 tablet 3  . bumetanide (BUMEX) 2 MG tablet TAKE 1 TABLET BY MOUTH DAILY AND AN EXTRA TABLET FOR WEIGHT GAIN 2 POUNDS DAILY OR 5 POUNDS WEEKLY 180 tablet 3  . buPROPion (WELLBUTRIN XL) 300 MG 24 hr tablet Take 300 mg by mouth every morning.    . cholecalciferol (VITAMIN D) 1000 UNITS tablet Take 1,000 Units by mouth daily.    . Coenzyme  Q10 (CO Q 10 PO) Take 300 mg by mouth every evening.     . Cyanocobalamin (VITAMIN B-12 IJ) Inject 1 mL as directed every 30 (thirty) days.     [name] diltiazem (TIAZAC) 180 MG 24 hr capsule Take 1 capsule (180 mg total) by mouth every morning. 14 capsule 1  . ELIQUIS 5 MG TABS tablet TAKE 1 TABLET BY MOUTH 2 TIMES DAILY. 180 tablet 2  . escitalopram (LEXAPRO) 10 MG tablet Take 10 mg by mouth daily.    [name] losartan (COZAAR) 100 MG tablet Take 100 mg daily by mouth.    . magnesium oxide (MAG-OX) 400 (241.3 Mg) MG tablet TAKE 1 TABLET (400 MG TOTAL) BY MOUTH DAILY. 90 tablet 1  . metoprolol tartrate (LOPRESSOR) 25 MG tablet TAKE 1 TABLET BY MOUTH 2 TIMES DAILY. 180 tablet 1  . tamsulosin (FLOMAX) 0.4 MG CAPS capsule Take 2 capsules by mouth at bedtime.     No current facility-administered medications for this visit.     Allergies:   Ace inhibitors and Xarelto [rivaroxaban]    Social History:  The patient  reports that  has never smoked. he has never used smokeless tobacco. He reports that he does not drink alcohol or use drugs.   Family History:  The patient's family history includes Atrial fibrillation in his father; Cancer in his sister; Liver disease in his mother; Renal Disease in his mother; Stroke in his mother.    ROS:  Please see the history of present illness.   Otherwise, review of systems are positive  for none.   All other systems are reviewed and negative.    PHYSICAL EXAM: VS:  BP 110/64   Pulse (!) 115   Ht 5\' 8"  (1.727 m)   Wt 291 lb (132 kg)   BMI 44.25 kg/m  , BMI Body mass index is 44.25 kg/m. GENERAL:  Well appearing HEENT: Pupils equal round and reactive, fundi not visualized, oral mucosa unremarkable NECK:  JVP 1cm above clavicle sitting upright. Waveform within normal limits, carotid upstroke brisk and symmetric, no bruits LUNGS:  Clear to auscultation bilaterally HEART:  Tachycardic.  Irregularly irregular.  PMI not displaced or sustained,S1 and S2 within normal limits, no S3, no S4, no clicks, no rubs, no murmurs ABD:  Flat, positive bowel sounds normal in frequency in pitch, no bruits, no rebound, no guarding, no midline pulsatile mass, no hepatomegaly, no splenomegaly EXT:  2 plus pulses throughout, 1+ pitting edema to upper tibia bilaterally, no cyanosis no clubbing SKIN:  No rashes no nodules NEURO:  Cranial nerves II through XII grossly intact, motor grossly intact throughout PSYCH:  Cognitively intact, oriented to person place and time   EKG:  EKG is ordered today.   04/22/16: Sinus rhythm rate 67 bpm.  RSR' in V1. 05/20/16: Sinus rhythm.  Rate 75 bpm.  PACs.  06/29/17: Sinus rhythm.  Rate 83 bpm.  IVCD.  10/25/17: Atrial fibrillation.  Rate 115 bpm.  LAFB.    Echo 12/11/15: Study Conclusions  - Left ventricle: The cavity size was normal. There was severe   focal basal hypertrophy of the septum. Systolic function was   normal. The estimated ejection fraction was in the range of 60%   to 65%. Wall motion was normal; there were no regional wall   motion abnormalities. Features are consistent with a pseudonormal   left ventricular filling pattern, with concomitant abnormal   relaxation and increased filling pressure (grade 2 diastolic   dysfunction). - Aortic valve:  Trileaflet; mildly thickened, mildly calcified   leaflets. There was trivial regurgitation. -  Aorta: Aortic root dimension: 41 mm (ED). - Ascending aorta: The ascending aorta was mildly dilated. - Left atrium: The atrium was moderately dilated.   Anterior-posterior dimension: 55 mm. Volume/bsa, ES, (1-plane   Simpson&'s, A2C): 40.2 ml/m^2.  Echo 07/08/15: Study Conclusions  - Left ventricle: The cavity size was normal. Wall thickness was increased in a pattern of moderate LVH. Systolic function was normal. The estimated ejection fraction was in the range of 60% to 65%. The study is not technically sufficient to allow evaluation of LV diastolic function. - Aortic valve: Trileaflet. Sclerosis without stenosis. There was mild regurgitation. - Mitral valve: Mildly thickened leaflets . There was mild regurgitation. - Left atrium: Moderately dilated at 43 ml/m2. - Right atrium: The atrium was mildly dilated. - Tricuspid valve: There was mild regurgitation. - Pulmonary arteries: PA peak pressure: 31 mm Hg (S). - Inferior vena cava: The vessel was normal in size. The respirophasic diameter changes were in the normal range (= 50%), consistent with normal central venous pressure.  Impressions:  - LVEF 60-65%, moderate LVH, normal wall motion, aortic valve sclerosis with mild AI, MR and TR, RVSP 31 mmHg., moderate LAE, mild RAE.   Recent Labs: 06/29/2017: ALT 21; BUN 19; Creatinine, Ser 0.89; Potassium 4.3; Sodium 145; TSH 3.800    Lipid Panel No results found for: CHOL, TRIG, HDL, CHOLHDL, VLDL, LDLCALC, LDLDIRECT    Wt Readings from Last 3 Encounters:  10/25/17 291 lb (132 kg)  06/29/17 293 lb 6.4 oz (133.1 kg)  06/07/17 292 lb (132.5 kg)     Other studies Reviewed: Additional studies/ records that were reviewed today include: n/a  ASSESSMENT AND PLAN:  # Atrial fibrillation/flutter: # PVCs: Eric Mayer is back in atrial fibrillation and feeling poorly.  His blood pressure has been low so we cannot titrate his nodal agents.  Continue  amiodarone, metoprolol, and diltiazem.  Unfortunately he missed 2 doses of Eliquis.  Therefore he will need TEE prior to cardioversion.  We have arranged for this to happen 3/15.  He will reduce his losartan to 50 mg daily so that metoprolol can be increased to 50 mg twice daily in the interim.  This can be changed back once he is back in sinus rhythm.  This patients CHA2DS2-VASc Score and unadjusted Ischemic Stroke Rate (% per year) is equal to 2.2 % stroke rate/year from a score of 2  Above score calculated as 1 point each if present [CHF, HTN, DM, Vascular=MI/PAD/Aortic Plaque, Age if 65-74, or Male]  Above score calculated as 2 points each if present [Age > 75, or Stroke/TIA/TE]  # Hypertension:  Blood pressure stable.  Temporarily reduce losartan to 50mg  and increase metoprolol as above.    # Acute on chronic diastolic heart failure:  Eric Mayer is volume overloaded.  He was doing well before going into atrial fibrillation.  Take an extra dose of bumex for 2 days.  Continue BP control as above.      Current medicines are reviewed at length with the patient today.  The patient does not have concerns regarding medicines.  The following changes have been made: Reduce losartan and increase metoprolol  Labs/ tests ordered today include:  Orders Placed This Encounter  Procedures  . CBC with Differential/Platelet  . Basic metabolic panel  . Magnesium  . EKG 12-Lead     Disposition:   FU with Dr. Oval Linsey in 4 weeks.  Signed, [name], MD  10/25/2017 5:21 PM    [place]

## 2017-10-26 LAB — CBC WITH DIFFERENTIAL/PLATELET
Basophils Absolute: 0 10*3/uL (ref 0.0–0.2)
Basos: 0 %
EOS (ABSOLUTE): 0.3 10*3/uL (ref 0.0–0.4)
Eos: 3 %
Hematocrit: 38.5 % (ref 37.5–51.0)
Hemoglobin: 12 g/dL — ABNORMAL LOW (ref 13.0–17.7)
Immature Grans (Abs): 0 10*3/uL (ref 0.0–0.1)
Immature Granulocytes: 0 %
Lymphocytes Absolute: 2.2 10*3/uL (ref 0.7–3.1)
Lymphs: 25 %
MCH: 29.3 pg (ref 26.6–33.0)
MCHC: 31.2 g/dL — ABNORMAL LOW (ref 31.5–35.7)
MCV: 94 fL (ref 79–97)
Monocytes Absolute: 0.7 10*3/uL (ref 0.1–0.9)
Monocytes: 8 %
Neutrophils Absolute: 5.8 10*3/uL (ref 1.4–7.0)
Neutrophils: 64 %
Platelets: 207 10*3/uL (ref 150–379)
RBC: 4.1 x10E6/uL — ABNORMAL LOW (ref 4.14–5.80)
RDW: 14.5 % (ref 12.3–15.4)
WBC: 9 10*3/uL (ref 3.4–10.8)

## 2017-10-26 LAB — BASIC METABOLIC PANEL
BUN/Creatinine Ratio: 24 (ref 10–24)
BUN: 28 mg/dL — ABNORMAL HIGH (ref 8–27)
CO2: 21 mmol/L (ref 20–29)
Calcium: 8.7 mg/dL (ref 8.6–10.2)
Chloride: 107 mmol/L — ABNORMAL HIGH (ref 96–106)
Creatinine, Ser: 1.17 mg/dL (ref 0.76–1.27)
GFR calc Af Amer: 71 mL/min/{1.73_m2} (ref >59)
GFR calc non Af Amer: 61 mL/min/{1.73_m2} (ref >59)
Glucose: 100 mg/dL — ABNORMAL HIGH (ref 65–99)
Potassium: 4.7 mmol/L (ref 3.5–5.2)
Sodium: 145 mmol/L — ABNORMAL HIGH (ref 134–144)

## 2017-10-26 LAB — MAGNESIUM: Magnesium: 1.4 mg/dL — ABNORMAL LOW (ref 1.6–2.3)

## 2017-10-28 ENCOUNTER — Other Ambulatory Visit: Payer: Self-pay

## 2017-10-28 ENCOUNTER — Encounter (HOSPITAL_COMMUNITY)
Admission: RE | Disposition: A | Discharge status: Home / Self Care | Payer: Self-pay | Source: Ambulatory Visit | Attending: Cardiology

## 2017-10-28 ENCOUNTER — Ambulatory Visit: Payer: Medicare Other | Admitting: Cardiovascular Disease

## 2017-10-28 ENCOUNTER — Ambulatory Visit (HOSPITAL_COMMUNITY): Payer: Medicare Other | Admitting: Certified Registered Nurse Anesthetist

## 2017-10-28 ENCOUNTER — Ambulatory Visit (HOSPITAL_COMMUNITY)
Admission: RE | Admit: 2017-10-28 | Discharge: 2017-10-28 | Disposition: A | Discharge status: Home / Self Care | Payer: Medicare Other | Source: Ambulatory Visit | Attending: Cardiology | Admitting: Cardiology

## 2017-10-28 ENCOUNTER — Ambulatory Visit (HOSPITAL_BASED_OUTPATIENT_CLINIC_OR_DEPARTMENT_OTHER)
Admission: RE | Admit: 2017-10-28 | Discharge: 2017-10-28 | Disposition: A | Discharge status: Home / Self Care | Payer: Medicare Other | Source: Ambulatory Visit | Attending: Cardiology | Admitting: Cardiology

## 2017-10-28 ENCOUNTER — Encounter (HOSPITAL_COMMUNITY): Payer: Self-pay | Admitting: Certified Registered Nurse Anesthetist

## 2017-10-28 DIAGNOSIS — I4891 Unspecified atrial fibrillation: Secondary | ICD-10-CM | POA: Diagnosis not present

## 2017-10-28 DIAGNOSIS — E669 Obesity, unspecified: Secondary | ICD-10-CM | POA: Diagnosis not present

## 2017-10-28 DIAGNOSIS — Z8546 Personal history of malignant neoplasm of prostate: Secondary | ICD-10-CM | POA: Insufficient documentation

## 2017-10-28 DIAGNOSIS — Z79899 Other long term (current) drug therapy: Secondary | ICD-10-CM | POA: Diagnosis not present

## 2017-10-28 DIAGNOSIS — I5033 Acute on chronic diastolic (congestive) heart failure: Secondary | ICD-10-CM | POA: Insufficient documentation

## 2017-10-28 DIAGNOSIS — I480 Paroxysmal atrial fibrillation: Secondary | ICD-10-CM | POA: Insufficient documentation

## 2017-10-28 DIAGNOSIS — Z8711 Personal history of peptic ulcer disease: Secondary | ICD-10-CM | POA: Diagnosis not present

## 2017-10-28 DIAGNOSIS — Z9884 Bariatric surgery status: Secondary | ICD-10-CM | POA: Diagnosis not present

## 2017-10-28 DIAGNOSIS — I4892 Unspecified atrial flutter: Secondary | ICD-10-CM | POA: Insufficient documentation

## 2017-10-28 DIAGNOSIS — F329 Major depressive disorder, single episode, unspecified: Secondary | ICD-10-CM | POA: Insufficient documentation

## 2017-10-28 DIAGNOSIS — I110 Hypertensive heart disease with heart failure: Secondary | ICD-10-CM | POA: Insufficient documentation

## 2017-10-28 DIAGNOSIS — Z6841 Body Mass Index (BMI) 40.0 and over, adult: Secondary | ICD-10-CM | POA: Insufficient documentation

## 2017-10-28 DIAGNOSIS — Z85831 Personal history of malignant neoplasm of soft tissue: Secondary | ICD-10-CM | POA: Diagnosis not present

## 2017-10-28 DIAGNOSIS — I351 Nonrheumatic aortic (valve) insufficiency: Secondary | ICD-10-CM | POA: Diagnosis not present

## 2017-10-28 DIAGNOSIS — G4733 Obstructive sleep apnea (adult) (pediatric): Secondary | ICD-10-CM | POA: Insufficient documentation

## 2017-10-28 HISTORY — PX: CARDIOVERSION: SHX1299

## 2017-10-28 HISTORY — PX: TEE WITHOUT CARDIOVERSION: SHX5443

## 2017-10-28 SURGERY — ECHOCARDIOGRAM, TRANSESOPHAGEAL
Anesthesia: General

## 2017-10-28 MED ORDER — BUTAMBEN-TETRACAINE-BENZOCAINE 2-2-14 % EX AERO
INHALATION_SPRAY | CUTANEOUS | Status: DC | PRN
  Administered 2017-10-28: 2 via TOPICAL

## 2017-10-28 MED ORDER — LIDOCAINE HCL (CARDIAC) 20 MG/ML IV SOLN
INTRAVENOUS | Status: DC | PRN
  Administered 2017-10-28: 100 mg via INTRAVENOUS

## 2017-10-28 MED ORDER — LACTATED RINGERS IV SOLN
Freq: Once | INTRAVENOUS | Status: AC
  Administered 2017-10-28: 07:00:00 via INTRAVENOUS

## 2017-10-28 MED ORDER — SODIUM CHLORIDE 0.9 % IV SOLN
INTRAVENOUS | Status: DC
  Administered 2017-10-28: 08:00:00 via INTRAVENOUS

## 2017-10-28 MED ORDER — PROPOFOL 500 MG/50ML IV EMUL
INTRAVENOUS | Status: DC | PRN
  Administered 2017-10-28: 100 ug/kg/min via INTRAVENOUS

## 2017-10-28 MED ORDER — PROPOFOL 10 MG/ML IV BOLUS
INTRAVENOUS | Status: DC | PRN
  Administered 2017-10-28: 30 mg via INTRAVENOUS

## 2017-10-28 NOTE — CV Procedure (Addendum)
Procedure:TEE  Indication: Atrial fibrillation  Sedation: Per anesthesiology  Findings:  Please see echo section for full report.  Normal LV size with moderate LV hypertrophy.  EF 55-60%, normal wall motion.  Mildly dilated RV with mildly decreased systolic function.  Moderate left atrial enlargement, no LA appendage thrombus.  Mild right atrial enlargement.  No PFO/ASD by color doppler.  Trivial TR.  Mild-moderate mitral regurgitation.  Trileaflet aortic valve with no stenosis, mild regurgitation.  Normal caliber thoracic aorta with mild plaque.    Impression: May proceed with DCCV.   [name] 10/28/2017 8:45 AM

## 2017-10-28 NOTE — Anesthesia Preprocedure Evaluation (Signed)
Anesthesia Evaluation  Patient identified by MRN, date of birth, ID band Patient awake    Reviewed: Allergy & Precautions, H&P , NPO status , Patient's Chart, lab work & pertinent test results  Airway Mallampati: II   Neck ROM: full    Dental   Pulmonary asthma , sleep apnea ,    breath sounds clear to auscultation       Cardiovascular hypertension, +CHF  + dysrhythmias Atrial Fibrillation  Rhythm:irregular Rate:Normal     Neuro/Psych PSYCHIATRIC DISORDERS Depression    GI/Hepatic   Endo/Other    Renal/GU      Musculoskeletal  (+) Arthritis ,   Abdominal   Peds  Hematology   Anesthesia Other Findings   Reproductive/Obstetrics                             Anesthesia Physical Anesthesia Plan  ASA: III  Anesthesia Plan: General   Post-op Pain Management:    Induction:   PONV Risk Score and Plan: 2 and Propofol infusion, Ondansetron and Treatment may vary due to age or medical condition  Airway Management Planned: Nasal Cannula  Additional Equipment:   Intra-op Plan:   Post-operative Plan:   Informed Consent: I have reviewed the patients History and Physical, chart, labs and discussed the procedure including the risks, benefits and alternatives for the proposed anesthesia with the patient or authorized representative who has indicated his/her understanding and acceptance.     Plan Discussed with: CRNA and Anesthesiologist  Anesthesia Plan Comments:         Anesthesia Quick Evaluation

## 2017-10-28 NOTE — Transfer of Care (Signed)
Immediate Anesthesia Transfer of Care Note  Patient: Eric Mayer  Procedure(s) Performed: TRANSESOPHAGEAL ECHOCARDIOGRAM (TEE) (N/A ) CARDIOVERSION (N/A )  Patient Location: Endoscopy Unit  Anesthesia Type:General  Level of Consciousness: awake, alert  and oriented  Airway & Oxygen Therapy: Patient Spontanous Breathing and Patient connected to nasal cannula oxygen  Post-op Assessment: Report given to RN and Post -op Vital signs reviewed and stable  Post vital signs: Reviewed and stable  Last Vitals:  Vitals:   10/28/17 0710  BP: (!) 113/53  Pulse: 93  Resp: 16  Temp: 36.8 C  SpO2: 95%    Last Pain:  Vitals:   10/28/17 0710  TempSrc: Oral         Complications: No apparent anesthesia complications

## 2017-10-28 NOTE — Discharge Instructions (Signed)
TEE ° °YOU HAD AN CARDIAC PROCEDURE TODAY: Refer to the procedure report and other information in the discharge instructions given to you for any specific questions about what was found during the examination. If this information does not answer your questions, please call [facility] office at [phone] to clarify.  ° °DIET: Your first meal following the procedure should be a light meal and then it is ok to progress to your normal diet. A half-sandwich or bowl of soup is an example of a good first meal. Heavy or fried foods are harder to digest and may make you feel nauseous or bloated. Drink plenty of fluids but you should avoid alcoholic beverages for 24 hours. If you had a esophageal dilation, please see attached instructions for diet.  ° °ACTIVITY: Your care partner should take you home directly after the procedure. You should plan to take it easy, moving slowly for the rest of the day. You can resume normal activity the day after the procedure however YOU SHOULD NOT DRIVE, use power tools, machinery or perform tasks that involve climbing or major physical exertion for 24 hours (because of the sedation medicines used during the test).  ° °SYMPTOMS TO REPORT IMMEDIATELY: °A cardiologist can be reached at any hour. Please call [phone] for any of the following symptoms:  °Vomiting of blood or coffee ground material  °New, significant abdominal pain  °New, significant chest pain or pain under the shoulder blades  °Painful or persistently difficult swallowing  °New shortness of breath  °Black, tarry-looking or red, bloody stools ° °FOLLOW UP:  °Please also call with any specific questions about appointments or follow up tests. ° °Electrical Cardioversion, Care After °This sheet gives you information about how to care for yourself after your procedure. Your health care provider may also give you more specific instructions. If you have problems or questions, contact your health care provider. °What can I  expect after the procedure? °After the procedure, it is common to have: °· Some redness on the skin where the shocks were given. ° °Follow these instructions at home: °· Do not drive for 24 hours if you were given a medicine to help you relax (sedative). °· Take over-the-counter and prescription medicines only as told by your health care provider. °· Ask your health care provider how to check your pulse. Check it often. °· Rest for 48 hours after the procedure or as told by your health care provider. °· Avoid or limit your caffeine use as told by your health care provider. °Contact a health care provider if: °· You feel like your heart is beating too quickly or your pulse is not regular. °· You have a serious muscle cramp that does not go away. °Get help right away if: °· You have discomfort in your chest. °· You are dizzy or you feel faint. °· You have trouble breathing or you are short of breath. °· Your speech is slurred. °· You have trouble moving an arm or leg on one side of your body. °· Your fingers or toes turn cold or blue. °This information is not intended to replace advice given to you by your health care provider. Make sure you discuss any questions you have with your health care provider. °Document Released: 05/23/2013 Document Revised: 03/05/2016 Document Reviewed: 02/06/2016 °Elsevier Interactive Patient Education © 2018 [employer] Inc[employer] ° °

## 2017-10-28 NOTE — Progress Notes (Signed)
  Echocardiogram Echocardiogram Transesophageal has been performed.  Madelyn Tlatelpa G Patra Gherardi 10/28/2017, 9:55 AM

## 2017-10-28 NOTE — Interval H&P Note (Signed)
History and Physical Interval Note:  10/28/2017 8:29 AM  Eric Mayer  has presented today for surgery, with the diagnosis of A-FIB  The various methods of treatment have been discussed with the patient and family. After consideration of risks, benefits and other options for treatment, the patient has consented to  Procedure(s): TRANSESOPHAGEAL ECHOCARDIOGRAM (TEE) (N/A) CARDIOVERSION (N/A) as a surgical intervention .  The patient's history has been reviewed, patient examined, no change in status, stable for surgery.  I have reviewed the patient's chart and labs.  Questions were answered to the patient's satisfaction.     Mandeep Kiser [employer]

## 2017-10-28 NOTE — Procedures (Signed)
Electrical Cardioversion Procedure Note VERYL Mayer [id number] 05-09-1941  Procedure: Electrical Cardioversion Indications:  Atrial Fibrillation  Procedure Details Consent: Risks of procedure as well as the alternatives and risks of each were explained to the (patient/caregiver).  Consent for procedure obtained. Time Out: Verified patient identification, verified procedure, site/side was marked, verified correct patient position, special equipment/implants available, medications/allergies/relevent history reviewed, required imaging and test results available.  Performed  Patient placed on cardiac monitor, pulse oximetry, supplemental oxygen as necessary.  Sedation given: Per anesthesiology Pacer pads placed anterior and posterior chest.  Cardioverted 1 time(s).  Cardioverted at [place].  Evaluation Findings: Post procedure EKG shows: NSR Complications: None Patient did tolerate procedure well.   Eric Mayer 10/28/2017, 8:45 AM

## 2017-10-29 ENCOUNTER — Encounter (HOSPITAL_COMMUNITY): Payer: Self-pay | Admitting: Cardiology

## 2017-10-29 NOTE — Anesthesia Postprocedure Evaluation (Signed)
Anesthesia Post Note  Patient: Eric Mayer  Procedure(s) Performed: TRANSESOPHAGEAL ECHOCARDIOGRAM (TEE) (N/A ) CARDIOVERSION (N/A )     Patient location during evaluation: PACU Anesthesia Type: General Level of consciousness: awake and alert Pain management: pain level controlled Vital Signs Assessment: post-procedure vital signs reviewed and stable Respiratory status: spontaneous breathing, nonlabored ventilation, respiratory function stable and patient connected to nasal cannula oxygen Cardiovascular status: blood pressure returned to baseline and stable Postop Assessment: no apparent nausea or vomiting Anesthetic complications: no    Last Vitals:  Vitals:   10/28/17 0710 10/28/17 0859  BP: (!) 113/53 (!) 106/55  Pulse: 93 80  Resp: 16 17  Temp: 36.8 C 36.6 C  SpO2: 95% 98%    Last Pain:  Vitals:   10/28/17 0859  TempSrc: Oral   Pain Goal:                 Binnie Vonderhaar S

## 2017-11-04 ENCOUNTER — Telehealth: Payer: Self-pay | Admitting: Cardiovascular Disease

## 2017-11-04 MED ORDER — MAGNESIUM OXIDE 400 (241.3 MG) MG PO TABS: 400.0000 mg | ORAL_TABLET | Freq: Two times a day (BID) | ORAL | 1 refills | Status: DC

## 2017-11-04 NOTE — Telephone Encounter (Signed)
New Message ° ° °Patient is returning call in reference to labs. Please call to discuss.  °

## 2017-11-04 NOTE — Telephone Encounter (Signed)
Advised patient of lab results and medication changes  °

## 2017-11-04 NOTE — Telephone Encounter (Signed)
Routed to primary nurse 

## 2017-11-04 NOTE — Telephone Encounter (Signed)
-----   Message from [name], MD sent at 11/02/2017 12:03 PM EDT ----- Blood counts improving.  Magnesium levels remain low.  Increase magnesium supplement to 400 mg twice daily.

## 2017-12-07 ENCOUNTER — Telehealth: Payer: Self-pay | Admitting: Cardiology

## 2017-12-07 ENCOUNTER — Telehealth: Payer: Self-pay | Admitting: *Deleted

## 2017-12-07 NOTE — Telephone Encounter (Signed)
I spoke with Eric Mayer. His colonoscopy (and possible polypectomy) is scheduled for June 5th. He has an appointment with Dr Oval Linsey May 7th. Pre operative clearance and recommendations about holding Eliquis to be given at that time.   [name] PA-C 12/07/2017 3:55 PM

## 2017-12-07 NOTE — Telephone Encounter (Signed)
   [facility] Pre-operative Risk Assessment    Request for surgical clearance:  1. What type of surgery is being performed? Colonoscopy   2. When is this surgery scheduled? 01/18/18   3. What type of clearance is required (medical clearance vs. Pharmacy clearance to hold med vs. Both)? both  4. Are there any medications that need to be held prior to surgery and how long? Eliquis   5. Practice name and name of physician performing surgery? [facility] Dr. Watt Climes   6. What is your office phone number [phone]    7.   What is your office fax number [phone]  8.   Anesthesia type (None, local, MAC, general) ?    Eric Mayer Eric Mayer 12/07/2017, 12:05 PM  _________________________________________________________________   (provider comments below)

## 2017-12-07 NOTE — Telephone Encounter (Signed)
I spoke with Mr Eric Mayer. His colonoscopy (and possible polypectomy) is scheduled for June 5th. He has an appointment with Dr Oval Linsey May 7th. Pre operative clearance and recommendations about holding Eliquis to be given at that time.   [name] PA-C 12/07/2017 3:55 PM

## 2017-12-20 ENCOUNTER — Encounter: Payer: Self-pay | Admitting: Cardiovascular Disease

## 2017-12-20 ENCOUNTER — Ambulatory Visit (INDEPENDENT_AMBULATORY_CARE_PROVIDER_SITE_OTHER): Payer: Medicare Other | Admitting: Cardiovascular Disease

## 2017-12-20 VITALS — BP 118/64 | HR 68 | Ht 70.0 in | Wt 298.8 lb

## 2017-12-20 DIAGNOSIS — R0602 Shortness of breath: Secondary | ICD-10-CM | POA: Diagnosis not present

## 2017-12-20 DIAGNOSIS — I4819 Other persistent atrial fibrillation: Secondary | ICD-10-CM

## 2017-12-20 DIAGNOSIS — Z7901 Long term (current) use of anticoagulants: Secondary | ICD-10-CM | POA: Diagnosis not present

## 2017-12-20 DIAGNOSIS — E6609 Other obesity due to excess calories: Secondary | ICD-10-CM

## 2017-12-20 DIAGNOSIS — I481 Persistent atrial fibrillation: Secondary | ICD-10-CM | POA: Diagnosis not present

## 2017-12-20 DIAGNOSIS — I5033 Acute on chronic diastolic (congestive) heart failure: Secondary | ICD-10-CM

## 2017-12-20 DIAGNOSIS — I480 Paroxysmal atrial fibrillation: Secondary | ICD-10-CM

## 2017-12-20 MED ORDER — METOLAZONE 5 MG PO TABS: ORAL_TABLET | ORAL | 0 refills | Status: DC

## 2017-12-20 NOTE — Patient Instructions (Addendum)
Medication Instructions:  START METOLAZONE 5 MG 1 TODAY AND AS NEEDED FOR WEIGHT GAIN ABOVE 7 POUNDS  Labwork: NONE  Testing/Procedures: NONE  Follow-Up: Your physician recommends that you schedule a follow-up appointment in: 2 MONTHS   IT IS OK TO HOLD ELIQUIS FOR 3 DAYS PRIOR TO COLONOSCOPY   If you need a refill on your cardiac medications before your next appointment, please call your pharmacy.

## 2017-12-20 NOTE — Progress Notes (Signed)
Cardiology Office Note   Date:  12/20/2017   ID:  Eric Mayer, DOB Apr 01, 1941, MRN [id number]  PCP:  [name], MD  Cardiologist:   [name], MD   No chief complaint on file.    History of Present Illness: Eric Mayer is a 77 y.o. male with HTN, paroxysmal atrial fibrillation, chronic diastolic heart failure, OSA on CPAP, asthma, BPPV, and prostate adenocarcinoma here for follow up.  Eric Mayer initially presented 03/2015 with dyspnea on exertion and chronic lower extremity edema.  He had a dobutamine stress echo 03/27/15 that was negative for ischemia.  He had an echo 06/2015 that revealed LVEF 60-65% with mild aortic regurgitation and mitral regurgitation.  They were unable to evaluate diastolic function.  He was admitted 07/2015 for acute on chronic diastolic heart failure and diuresed with IV lasix. He also underwent DCCV.  Eric Mayer was seen in clinic 04/2016 and reported fatigue, shortness of breath, weight gain, and low blood pressures.  At that appointment he was switched from Lasix to Bumex and his valsartan was reduced. His weight is labile and fluctuates with dieteary changes.  He has been very responsive to extra doses of Bumex as needed.  Eric Mayer saw [name] 07/21/16 with increased dyspnea on exertion.  He was noted to be back in atrial fibrillation in the setting of not wearing his CPAP for the preceding week.  He was started on amiodarone 200mg  and metoprolol was reduced to 25mg  bid.  He converted back to sinus rhythm and diuresed 10 lb after returning to sinus rhythm.  Given that he was on amiodarone he was referred for PFTs 08/20/16 that revealed severe obstruction and likely moderate restriction.  There is also moderate diffusion defect.  He was referred to pulmonology and saw Dr. Louis Meckel. He underwent bronchoscopy on 09/21/16 which showed no intrathoracic obstruction or tracheal abnormality. He was started on Symbicort.  He was also referred to  atrial fibrillation for consideration of alternatives to amiodarone. At this time there was not felt to be any pharmacologic alternatives to amiodarone. He was thought to be a poor candidate for ablation.    At his last appointment Eric Mayer was back in atrial fibrillation.  He had missed two doses of Eliquis so he couldn't have DCCV without TEE.  Losartan was reduced and metoprolol was increased.  He underwent TEE/DCCV on 3/15.  Since that time he has been feeling much better.  He continues to have some shortness of breath and gets easily tired.  He had 2 salty meals this week and thinks that this is led to fluid accumulation.  His legs feel heavy and he has had more edema.  He denies orthopnea or PND.  He has not had any chest pain or pressure.  He has a colonoscopy pending.  His last was a virtual colonoscopy and it discovered some polyps.  This is scheduled for June.  He has also had some episodes of lightheadedness but no syncope.  This occurs when he is getting up from a seated position to standing if he starts walking too quickly.   Past Medical History:  Diagnosis Date  . Anemia 1980s X 1  . Arthritis    "hands, knees, hips, ankles" (07/31/2015)  . Atrial flutter ([place]) 07/31/2015  . Chronic diastolic heart failure ([place])    a. 11/2015: Echo w/ EF of 60-65%, no WMA, Grade 2 DD, trivial AR, ascending aorta mildly dilated.   . Depression   .  History of cardiovascular stress test    a. 03/2015: Dobutamine Stress Echo with no evidence of ischemia.   [name] History of gastric bypass   . History of peptic ulcer    1980's  . History of radiation therapy 1993   sarcoma of left groin, tx at [facility]  . History of sarcoma    1993  LEFT GOIN--  S/P SURGERY, RADIATION AND CHEMO IN [place]  . Hypertension   . Hypogonadism in male   . Incomplete right bundle branch block   . Nerve injury    SURGICAL NERVE INJURY S/P  LEFT GOIN REMOVAL SARCOMA 1992--  RESIDUAL WEAKNESS AND NUMBNESS UPPER LEFT LEG    . Nocturia   . OSA on CPAP   . PAF (paroxysmal atrial fibrillation) ([place])    prior CARDIOLOGIST-- DR Tressia Miners TURNER/   now monitored by dr Jenny Reichmann griffin  . Primary prostate adenocarcinoma ([place]) DX 07/08/14   Gleason 7,  stage T1c  . PVC's (premature ventricular contractions) 11/21/2015  . Sarcoma ([place]) ~ 1993/1994   "of groin"  . Weakness of left leg    SECONDARY TO SURGICAL NERVE INJURY OF LEFT GOIN  . Wears glasses     Past Surgical History:  Procedure Laterality Date  . CARDIAC CATHETERIZATION  08-12-2003  dr Tressia Miners turner   Normal coronary arteries, normal wall motion, no sig. abnormalities  . CARDIOVERSION N/A 08/04/2015   Procedure: CARDIOVERSION;  Surgeon: [name], MD;  Location: [facility];  Service: Cardiovascular;  Laterality: N/A;  . CARDIOVERSION N/A 10/28/2017   Procedure: CARDIOVERSION;  Surgeon: Larey Dresser, MD;  Location: [facility] ENDOSCOPY;  Service: Cardiovascular;  Laterality: N/A;  . COLONOSCOPY  07-03-2002  . [place]  . FRACTURE SURGERY    . GROIN DISSECTION Left 1992   [place]   SARCOMA SURGERY  . INGUINAL HERNIA REPAIR Right 1950s?  . INTESTINAL BYPASS  1976   GASTRIC FOR OBESITY  . JOINT REPLACEMENT    . PATELLA FRACTURE SURGERY Left ~ 1995   "broke it twice; only had OR once"  . PROSTATE BIOPSY  06/2014  . RADIOACTIVE SEED IMPLANT N/A 10/17/2014   Procedure: RADIOACTIVE SEED IMPLANT    ;  Surgeon: Ailene Rud, MD;  Location: [facility];  Service: Urology;  Laterality: N/A;   68 SEEDS IMPLANTED   . TEE WITHOUT CARDIOVERSION N/A 10/28/2017   Procedure: TRANSESOPHAGEAL ECHOCARDIOGRAM (TEE);  Surgeon: Larey Dresser, MD;  Location: [facility] ENDOSCOPY;  Service: Cardiovascular;  Laterality: N/A;  . TONSILLECTOMY  1950s  . TOTAL KNEE ARTHROPLASTY Right 12/17/2014   Procedure: TOTAL KNEE ARTHROPLASTY;  Surgeon: Melrose Nakayama, MD;  Location: [facility];  Service: Orthopedics;  Laterality: Right;  . TRANSTHORACIC  ECHOCARDIOGRAM  08-08-2003   moderate LVH/  ef 55-65%/  mild MR/  moderate LAE/  trivial TR/  trivial pericardial effusion posterior to the heart  . [place]  . VIDEO BRONCHOSCOPY Bilateral 09/21/2016   Procedure: VIDEO BRONCHOSCOPY WITHOUT FLUORO;  Surgeon: Collene Gobble, MD;  Location: [facility];  Service: Cardiopulmonary;  Laterality: Bilateral;     Current Outpatient Medications  Medication Sig Dispense Refill  . acetaminophen (TYLENOL) 325 MG tablet Take 2 tablets (650 mg total) by mouth every 4 (four) hours as needed for headache or mild pain.    [name] amiodarone (PACERONE) 200 MG tablet TAKE 1 TABLET BY MOUTH DAILY. (Patient taking differently: TAKE 200 MG BY MOUTH DAILY.) 90 tablet 3  .  bumetanide (BUMEX) 2 MG tablet TAKE 1 TABLET BY MOUTH DAILY AND AN EXTRA TABLET FOR WEIGHT GAIN 2 POUNDS DAILY OR 5 POUNDS WEEKLY (Patient taking differently: Take 2 mg by mouth See admin instructions. TAKE 2 MG BY MOUTH DAILY AND AN EXTRA 2 MG FOR WEIGHT GAIN 2 POUNDS DAILY OR 5 POUNDS WEEKLY) 180 tablet 3  . buPROPion (WELLBUTRIN XL) 300 MG 24 hr tablet Take 300 mg by mouth every morning.    . cholecalciferol (VITAMIN D) 1000 UNITS tablet Take 1,000 Units by mouth daily.    . Coenzyme Q10 (CO Q 10) 100 MG CAPS Take 300 mg by mouth every evening.     . cyanocobalamin (,VITAMIN B-12,) 1000 MCG/ML injection Inject 1,000 mcg into the muscle every 30 (thirty) days.    [name] diltiazem (TIAZAC) 180 MG 24 hr capsule Take 1 capsule (180 mg total) by mouth every morning. 14 capsule 1  . ELIQUIS 5 MG TABS tablet TAKE 1 TABLET BY MOUTH 2 TIMES DAILY. (Patient taking differently: TAKE 5 MG BY MOUTH 2 TIMES DAILY.) 180 tablet 2  . escitalopram (LEXAPRO) 10 MG tablet Take 10 mg by mouth daily.    [name] losartan (COZAAR) 100 MG tablet Take 50 mg by mouth daily.     . magnesium oxide (MAG-OX) 400 (241.3 Mg) MG tablet Take 1 tablet (400 mg total) by mouth 2 (two) times daily. 180 tablet 1  . metoprolol tartrate  (LOPRESSOR) 25 MG tablet TAKE 1 TABLET BY MOUTH 2 TIMES DAILY. (Patient taking differently: Take 50 mg by mouth 2 (two) times daily. ) 180 tablet 1  . Multiple Vitamins-Minerals (MULTIVITAMIN PO) Take 1 tablet by mouth daily.    . tamsulosin (FLOMAX) 0.4 MG CAPS capsule Take 0.8 mg by mouth at bedtime.     . metolazone (ZAROXOLYN) 5 MG tablet 1 TABLET TODAY AND THEN AS NEEDED FOR WEIGHT GAIN  ABOVE 7 POUNDS 10 tablet 0   No current facility-administered medications for this visit.     Allergies:   Ace inhibitors and Xarelto [rivaroxaban]    Social History:  The patient  reports that he has never smoked. He has never used smokeless tobacco. He reports that he does not drink alcohol or use drugs.   Family History:  The patient's family history includes Atrial fibrillation in his father; Cancer in his sister; Liver disease in his mother; Renal Disease in his mother; Stroke in his mother.    ROS:  Please see the history of present illness.   Otherwise, review of systems are positive for none.   All other systems are reviewed and negative.    PHYSICAL EXAM: VS:  BP 118/64   Pulse 68   Ht 5\' 10"  (1.778 m)   Wt 298 lb 12.8 oz (135.5 kg)   BMI 42.87 kg/m  , BMI Body mass index is 42.87 kg/m. GENERAL:  Well appearing HEENT: Pupils equal round and reactive, fundi not visualized, oral mucosa unremarkable NECK:  + jugular venous distention, waveform within normal limits, carotid upstroke brisk and symmetric, no bruits LUNGS:  Clear to auscultation bilaterally HEART:  RRR.  PMI not displaced or sustained,S1 and S2 within normal limits, no S3, no S4, no clicks, no rubs, no murmurs ABD:  Flat, positive bowel sounds normal in frequency in pitch, no bruits, no rebound, no guarding, no midline pulsatile mass, no hepatomegaly, no splenomegaly EXT:  2 plus pulses throughout, 1+ piting edema to the upper tibia and dependent edema in the thighs, no cyanosis  no clubbing SKIN:  No rashes no nodules NEURO:   Cranial nerves II through XII grossly intact, motor grossly intact throughout PSYCH:  Cognitively intact, oriented to person place and time   EKG:  EKG is ordered today.   04/22/16: Sinus rhythm rate 67 bpm.  RSR' in V1. 05/20/16: Sinus rhythm.  Rate 75 bpm.  PACs.  06/29/17: Sinus rhythm.  Rate 83 bpm.  IVCD.  10/25/17: Atrial fibrillation.  Rate 115 bpm.  LAFB.   12/20/17: Sinus rhythm.  Sinus arrhythmia.  First-degree AV block.  Ventricular rate 68 bpm.  LAFB.  Echo 12/11/15: Study Conclusions  - Left ventricle: The cavity size was normal. There was severe   focal basal hypertrophy of the septum. Systolic function was   normal. The estimated ejection fraction was in the range of 60%   to 65%. Wall motion was normal; there were no regional wall   motion abnormalities. Features are consistent with a pseudonormal   left ventricular filling pattern, with concomitant abnormal   relaxation and increased filling pressure (grade 2 diastolic   dysfunction). - Aortic valve: Trileaflet; mildly thickened, mildly calcified   leaflets. There was trivial regurgitation. - Aorta: Aortic root dimension: 41 mm (ED). - Ascending aorta: The ascending aorta was mildly dilated. - Left atrium: The atrium was moderately dilated.   Anterior-posterior dimension: 55 mm. Volume/bsa, ES, (1-plane   Simpson&'s, A2C): 40.2 ml/m^2.  Echo 07/08/15: Study Conclusions  - Left ventricle: The cavity size was normal. Wall thickness was increased in a pattern of moderate LVH. Systolic function was normal. The estimated ejection fraction was in the range of 60% to 65%. The study is not technically sufficient to allow evaluation of LV diastolic function. - Aortic valve: Trileaflet. Sclerosis without stenosis. There was mild regurgitation. - Mitral valve: Mildly thickened leaflets . There was mild regurgitation. - Left atrium: Moderately dilated at 43 ml/m2. - Right atrium: The atrium was mildly  dilated. - Tricuspid valve: There was mild regurgitation. - Pulmonary arteries: PA peak pressure: 31 mm Hg (S). - Inferior vena cava: The vessel was normal in size. The respirophasic diameter changes were in the normal range (= 50%), consistent with normal central venous pressure.  Impressions:  - LVEF 60-65%, moderate LVH, normal wall motion, aortic valve sclerosis with mild AI, MR and TR, RVSP 31 mmHg., moderate LAE, mild RAE.   Recent Labs: 06/29/2017: ALT 21; TSH 3.800 10/25/2017: BUN 28; Creatinine, Ser 1.17; Hemoglobin 12.0; Magnesium 1.4; Platelets 207; Potassium 4.7; Sodium 145    Lipid Panel No results found for: CHOL, TRIG, HDL, CHOLHDL, VLDL, LDLCALC, LDLDIRECT    Wt Readings from Last 3 Encounters:  12/20/17 298 lb 12.8 oz (135.5 kg)  10/28/17 284 lb (128.8 kg)  10/25/17 291 lb (132 kg)     Other studies Reviewed: Additional studies/ records that were reviewed today include: n/a  ASSESSMENT AND PLAN:  # Atrial fibrillation/flutter: # PVCs: Eric Mayer is back in sinus rhythm and feeling better after DCCV.  Continue amiodarone, diltiazem, metoprolol and Eliquis.  OK to hold Eliquis 3 days prior to colonoscopy.   This patients CHA2DS2-VASc Score and unadjusted Ischemic Stroke Rate (% per year) is equal to 2.2 % stroke rate/year from a score of 2  Above score calculated as 1 point each if present [CHF, HTN, DM, Vascular=MI/PAD/Aortic Plaque, Age if 65-74, or Male]  Above score calculated as 2 points each if present [Age > 75, or Stroke/TIA/TE]  # Hypertension:  BP well controlled on losartan, metoprolol,  and diltiazem.   # Acute on chronic diastolic heart failure:  Eric Mayer is volume overloaded. We will give metolazone 5mg  po x1.  He can take this if his weight is up >7lb.  Continue bumex.     Current medicines are reviewed at length with the patient today.  The patient does not have concerns regarding medicines.  The following changes have been  made: Metolazone prn  Labs/ tests ordered today include:  Orders Placed This Encounter  Procedures  . EKG 12-Lead     Disposition:   FU with Dr. Oval Linsey in 2 months.    Signed, [name], MD  12/20/2017 10:17 AM    [place]

## 2017-12-27 ENCOUNTER — Encounter: Payer: Self-pay | Admitting: Cardiovascular Disease

## 2017-12-28 ENCOUNTER — Telehealth: Payer: Self-pay | Admitting: Cardiovascular Disease

## 2017-12-28 NOTE — Telephone Encounter (Signed)
New message  Patient requesting a response to his Mychart message from 12/27/17

## 2017-12-28 NOTE — Telephone Encounter (Signed)
Spoke with pt, advised him that his bp is so low because of the weight loss opf 17 lbs in 3 days. He was advised that if he drinks too much fluid to recover his bp he will get in trouble with heart failure. He was instructed to hold his metoprolol tonight if bp continues to be low. He will not take the losartan in the morning if bp continues to be down. He will take the diltiazem and metoprolol in the morning and will call if still having problems. He will hold the bumex in the morning if his weight is the same. Pt agreed with this plan.

## 2017-12-30 ENCOUNTER — Encounter: Payer: Self-pay | Admitting: Cardiovascular Disease

## 2017-12-30 ENCOUNTER — Ambulatory Visit (INDEPENDENT_AMBULATORY_CARE_PROVIDER_SITE_OTHER): Payer: Medicare Other | Admitting: Cardiovascular Disease

## 2017-12-30 VITALS — BP 131/66 | HR 77 | Ht 68.0 in | Wt 283.4 lb

## 2017-12-30 DIAGNOSIS — I10 Essential (primary) hypertension: Secondary | ICD-10-CM | POA: Diagnosis not present

## 2017-12-30 DIAGNOSIS — I493 Ventricular premature depolarization: Secondary | ICD-10-CM | POA: Diagnosis not present

## 2017-12-30 DIAGNOSIS — I480 Paroxysmal atrial fibrillation: Secondary | ICD-10-CM

## 2017-12-30 DIAGNOSIS — Z5181 Encounter for therapeutic drug level monitoring: Secondary | ICD-10-CM

## 2017-12-30 DIAGNOSIS — I5033 Acute on chronic diastolic (congestive) heart failure: Secondary | ICD-10-CM

## 2017-12-30 LAB — BASIC METABOLIC PANEL
BUN/Creatinine Ratio: 55 — ABNORMAL HIGH (ref 10–24)
BUN: 54 mg/dL — ABNORMAL HIGH (ref 8–27)
CO2: 23 mmol/L (ref 20–29)
Calcium: 9.3 mg/dL (ref 8.6–10.2)
Chloride: 104 mmol/L (ref 96–106)
Creatinine, Ser: 0.99 mg/dL (ref 0.76–1.27)
GFR calc Af Amer: 86 mL/min/{1.73_m2} (ref >59)
GFR calc non Af Amer: 75 mL/min/{1.73_m2} (ref >59)
Glucose: 111 mg/dL — ABNORMAL HIGH (ref 65–99)
Potassium: 4 mmol/L (ref 3.5–5.2)
Sodium: 141 mmol/L (ref 134–144)

## 2017-12-30 LAB — MAGNESIUM: Magnesium: 2.3 mg/dL (ref 1.6–2.3)

## 2017-12-30 NOTE — Patient Instructions (Addendum)
Medication Instructions:  TAKE THE LOSARTAN 100 MG 1/2 TABLET DAILY   HOLD YOUR BUMETANIDE (BUMEX) TODAY   Labwork: BMET/MAGNESIUM TODAY   Testing/Procedures: NONE  Follow-Up: KEEP AS SCHEDULED   Any Other Special Instructions Will Be Listed Below (If Applicable). RESTRICT YOUR FLUID INTAKE TO 2 LITERS A DAY    Fluid Restriction Some health conditions may require you to restrict your fluid intake. This means that you need to limit the amount of fluid you drink each day. When you have a fluid restriction, you must carefully measure and keep track of the amount of fluid you drink. Your health care provider will identify the specific amount of fluid you are allowed each day. This amount may depend on several things, such as:  The amount of urine you produce in a day.  How much fluid you are keeping (retaining) in your body.  Your blood pressure.  What is my plan? Your health care provider recommends that you limit your fluid intake to ___2 LITERS_______ per day. What counts toward my fluid intake? Your fluid intake includes all liquids that you drink, as well as any foods that become liquid at room temperature. The following are examples of some fluids that you will have to restrict:  Tea, coffee, soda, lemonade, milk, water, juice, sport drinks, and nutritional supplement beverages.  Alcoholic beverages.  Cream.  Gravy.  Ice cubes.  Soup and broth.  The following are examples of foods that become liquid at room temperature. These foods will also count toward your fluid intake.  Ice cream and ice milk.  Frozen yogurt and sherbet.  Frozen ice pops.  Flavored gelatin.  How do I keep track of my fluid intake? Each morning, fill a jug with the amount of water that equals the amount of fluid you are allowed for the day. You can use this water as a guideline for fluid allowance. Each time you take in any form of fluid, including ice cubes and foods that become liquid at  room temperature, pour an equal amount of water out of the container. This helps you to see how much fluid you are taking in. It also helps you to see how much of your fluid intake is left for the rest of the day. The following conversions may also be helpful in measuring your fluid intake:  1 cup equals 8 oz (240 mL).   cup equals 6 oz (180 mL).  ? cup equals 5? oz (160 mL).   cup equals 4 oz (120 mL).  ? cup equals 2? oz (80 mL).   cup equals 2 oz (60 mL).  2 Tbsp equals 1 oz (30 mL).  What home care instructions should I follow while restricting fluids?  Make sure that you stay within the recommended limit each day. Always measure and keep track of your fluids, as well as any foods that turn liquid at room temperature.  Use small cups and glasses and learn to sip fluids slowly.  Add a slice of fresh lemon or lemon juice to water or ice. This helps to satisfy your thirst.  Freeze fruit juice or water in an ice cube tray. Use this as part of your fluid allowance. These cubes are useful for quenching your thirst. Measure the amount of liquid in each ice cube prior to freezing so you can subtract this amount from your day's allowance when you consume each frozen cube.  Try frozen fruits between meals, such as grapes or strawberries.  Swallow your pills  along with meals or soft foods, such as applesauce or mashed potatoes. This helps you to save your fluid allowance for something that you enjoy.  Weigh yourself every day. Keeping track of your daily weight can help you and your health care provider to notice as soon as possible if you are retaining too much fluid in your body. ? Weigh yourself every morning after you urinate but before you eat breakfast. ? Wear the same amount of clothing each time you weigh yourself. ? Write down your daily weight. Give this weight record to your health care provider. If your weight is going up, you may be retaining too much fluid. Every 2 cups  (480 mL) of fluid retained in the body becomes an extra 1 lb (0.45 kg) of body weight.  Avoid salty foods. These foods make you thirsty and make fluid control more difficult.  Brush your teeth often or rinse your mouth with mouthwash to help your dry mouth. Lemon wedges, hard sour candies, chewing gum, or breath spray may also help to moisten your mouth.  Keep the temperature in your home at a cooler level. Dry air increases thirst, so keep the air in your home as humid as possible.  Avoid being out in the hot sun, which can cause you to sweat and become thirsty. What are some signs that I may be taking in too much fluid? You may be taking in too much fluid if:  Your weight increases. Contact your health care provider if your weight increases 3 lb or more in a day or if it increases 5 lb or more in a week.  Your face, hands, legs, feet, and belly (abdomen) start to swell.  You have trouble breathing.  This information is not intended to replace advice given to you by your health care provider. Make sure you discuss any questions you have with your health care provider. Document Released: 05/30/2007 Document Revised: 01/08/2016 Document Reviewed: 01/01/2014 Elsevier Interactive Patient Education  [employer].

## 2017-12-30 NOTE — Progress Notes (Signed)
Cardiology Office Note   Date:  12/30/2017   ID:  Eric Mayer, DOB Apr 11, 1941, MRN [id number]  PCP:  [name], MD  Cardiologist:   [name], MD   Chief Complaint  Patient presents with  . Follow-up     History of Present Illness: Eric Mayer is a 77 y.o. male with HTN, paroxysmal atrial fibrillation, chronic diastolic heart failure, OSA on CPAP, asthma, BPPV, and prostate adenocarcinoma here for follow up.  Eric Mayer initially presented 03/2015 with dyspnea on exertion and chronic lower extremity edema.  He had a dobutamine stress echo 03/27/15 that was negative for ischemia.  He had an echo 06/2015 that revealed LVEF 60-65% with mild aortic regurgitation and mitral regurgitation.  They were unable to evaluate diastolic function.  He was admitted 07/2015 for acute on chronic diastolic heart failure and diuresed with IV lasix. He also underwent DCCV.  Eric Mayer was seen in clinic 04/2016 and reported fatigue, shortness of breath, weight gain, and low blood pressures.  At that appointment he was switched from Lasix to Bumex and his valsartan was reduced. His weight is labile and fluctuates with dieteary changes.  He has been very responsive to extra doses of Bumex as needed.  Eric Mayer saw [name] 07/21/16 with increased dyspnea on exertion.  He was noted to be back in atrial fibrillation in the setting of not wearing his CPAP for the preceding week.  He was started on amiodarone 200mg  and metoprolol was reduced to 25mg  bid.  He converted back to sinus rhythm and diuresed 10 lb after returning to sinus rhythm.  Given that he was on amiodarone he was referred for PFTs 08/20/16 that revealed severe obstruction and likely moderate restriction.  There is also moderate diffusion defect.  He was referred to pulmonology and saw Dr. Louis Meckel. He underwent bronchoscopy on 09/21/16 which showed no intrathoracic obstruction or tracheal abnormality. He was started on Symbicort.   He was also referred to atrial fibrillation for consideration of alternatives to amiodarone. At this time there was not felt to be any pharmacologic alternatives to amiodarone. He was thought to be a poor candidate for ablation.    Eric Mayer had recurrent atrial fibrillation 09/2017.  He missed two doses of Eliquis so he couldn't have DCCV without TEE.  Losartan was reduced and metoprolol was increased.  He underwent TEE/DCCV on 3/15.  Since that time he has been feeling much better.  He continues to have some shortness of breath and gets easily tired.  He had 2 salty meals this week and thinks that this is led to fluid accumulation.  His legs feel heavy and he has had more edema.  He denies orthopnea or PND.  He has not had any chest pain or pressure.  He has a colonoscopy pending.  His last was a virtual colonoscopy and it discovered some polyps.  This is scheduled for June.  He has also had some episodes of lightheadedness but no syncope.  This occurs when he is getting up from a seated position to standing if he starts walking too quickly.  For the last 6 days he has been in and out of atrial fibrillation and very fatigued.  His blood pressures have been low in the 90s to 100s over 40s to 50s.  He has taken his own for 3 days in the last week and his weight is gone down from 293 pounds to 276 pounds.  He is been feeling extremely  tired when walking from room to room.  He checked his blood pressure and it has been as low as 80s over 40s.  Yesterday he did not take his losartan or his bumetanide.  He has not taken any medication yet this morning and he is feeling much better.  Over this time.  He also significantly reduced his fluid intake.  In the past he was drinking 3 L or more daily.  His edema has improved significantly and he is down 17 pounds.  He has no orthopnea or PND.   Past Medical History:  Diagnosis Date  . Anemia 1980s X 1  . Arthritis    "hands, knees, hips, ankles" (07/31/2015)  .  Atrial flutter ([place]) 07/31/2015  . Chronic diastolic heart failure ([place])    a. 11/2015: Echo w/ EF of 60-65%, no WMA, Grade 2 DD, trivial AR, ascending aorta mildly dilated.   . Depression   . History of cardiovascular stress test    a. 03/2015: Dobutamine Stress Echo with no evidence of ischemia.   [name] History of gastric bypass   . History of peptic ulcer    1980's  . History of radiation therapy 1993   sarcoma of left groin, tx at [facility]  . History of sarcoma    1993  LEFT GOIN--  S/P SURGERY, RADIATION AND CHEMO IN [place]  . Hypertension   . Hypogonadism in male   . Incomplete right bundle branch block   . 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 Nocturia   . OSA on CPAP   . PAF (paroxysmal atrial fibrillation) ([place])    prior CARDIOLOGIST-- DR Tressia Miners TURNER/   now monitored by dr Jenny Reichmann griffin  . Primary prostate adenocarcinoma ([place]) DX 07/08/14   Gleason 7,  stage T1c  . PVC's (premature ventricular contractions) 11/21/2015  . Sarcoma ([place]) ~ 1993/1994   "of groin"  . Weakness of left leg    SECONDARY TO SURGICAL NERVE INJURY OF LEFT GOIN  . Wears glasses     Past Surgical History:  Procedure Laterality Date  . CARDIAC CATHETERIZATION  08-12-2003  dr Tressia Miners turner   Normal coronary arteries, normal wall motion, no sig. abnormalities  . CARDIOVERSION N/A 08/04/2015   Procedure: CARDIOVERSION;  Surgeon: [name], MD;  Location: [place];  Service: Cardiovascular;  Laterality: N/A;  . CARDIOVERSION N/A 10/28/2017   Procedure: CARDIOVERSION;  Surgeon: Larey Dresser, MD;  Location: [facility] ENDOSCOPY;  Service: Cardiovascular;  Laterality: N/A;  . COLONOSCOPY  07-03-2002  . [place]  . FRACTURE SURGERY    . GROIN DISSECTION Left 1992   [place]   SARCOMA SURGERY  . INGUINAL HERNIA REPAIR Right 1950s?  . INTESTINAL BYPASS  1976   GASTRIC FOR OBESITY  .  JOINT REPLACEMENT    . PATELLA FRACTURE SURGERY Left ~ 1995   "broke it twice; only had OR once"  . PROSTATE BIOPSY  06/2014  . RADIOACTIVE SEED IMPLANT N/A 10/17/2014   Procedure: RADIOACTIVE SEED IMPLANT    ;  Surgeon: Ailene Rud, MD;  Location: [facility];  Service: Urology;  Laterality: N/A;   68 SEEDS IMPLANTED   . TEE WITHOUT CARDIOVERSION N/A 10/28/2017   Procedure: TRANSESOPHAGEAL ECHOCARDIOGRAM (TEE);  Surgeon: Larey Dresser, MD;  Location: [facility] ENDOSCOPY;  Service: Cardiovascular;  Laterality: N/A;  . TONSILLECTOMY  1950s  .  TOTAL KNEE ARTHROPLASTY Right 12/17/2014   Procedure: TOTAL KNEE ARTHROPLASTY;  Surgeon: Melrose Nakayama, MD;  Location: [place];  Service: Orthopedics;  Laterality: Right;  . TRANSTHORACIC ECHOCARDIOGRAM  08-08-2003   moderate LVH/  ef 55-65%/  mild MR/  moderate LAE/  trivial TR/  trivial pericardial effusion posterior to the heart  . [place]  . VIDEO BRONCHOSCOPY Bilateral 09/21/2016   Procedure: VIDEO BRONCHOSCOPY WITHOUT FLUORO;  Surgeon: Collene Gobble, MD;  Location: [place];  Service: Cardiopulmonary;  Laterality: Bilateral;     Current Outpatient Medications  Medication Sig Dispense Refill  . acetaminophen (TYLENOL) 325 MG tablet Take 2 tablets (650 mg total) by mouth every 4 (four) hours as needed for headache or mild pain.    [name] amiodarone (PACERONE) 200 MG tablet TAKE 1 TABLET BY MOUTH DAILY. (Patient taking differently: TAKE 200 MG BY MOUTH DAILY.) 90 tablet 3  . bumetanide (BUMEX) 2 MG tablet TAKE 1 TABLET BY MOUTH DAILY AND AN EXTRA TABLET FOR WEIGHT GAIN 2 POUNDS DAILY OR 5 POUNDS WEEKLY (Patient taking differently: Take 2 mg by mouth See admin instructions. TAKE 2 MG BY MOUTH DAILY AND AN EXTRA 2 MG FOR WEIGHT GAIN 2 POUNDS DAILY OR 5 POUNDS WEEKLY) 180 tablet 3  . buPROPion (WELLBUTRIN XL) 300 MG 24 hr tablet Take 300 mg by mouth every morning.    . cholecalciferol (VITAMIN D) 1000 UNITS tablet Take  1,000 Units by mouth daily.    . Coenzyme Q10 (CO Q 10) 100 MG CAPS Take 300 mg by mouth every evening.     . cyanocobalamin (,VITAMIN B-12,) 1000 MCG/ML injection Inject 1,000 mcg into the muscle every 30 (thirty) days.    [name] diltiazem (TIAZAC) 180 MG 24 hr capsule Take 1 capsule (180 mg total) by mouth every morning. 14 capsule 1  . ELIQUIS 5 MG TABS tablet TAKE 1 TABLET BY MOUTH 2 TIMES DAILY. (Patient taking differently: TAKE 5 MG BY MOUTH 2 TIMES DAILY.) 180 tablet 2  . escitalopram (LEXAPRO) 10 MG tablet Take 10 mg by mouth daily.    [name] losartan (COZAAR) 100 MG tablet Take 50 mg by mouth daily.     . magnesium oxide (MAG-OX) 400 (241.3 Mg) MG tablet Take 1 tablet (400 mg total) by mouth 2 (two) times daily. 180 tablet 1  . metolazone (ZAROXOLYN) 5 MG tablet 1 TABLET TODAY AND THEN AS NEEDED FOR WEIGHT GAIN  ABOVE 7 POUNDS 10 tablet 0  . metoprolol tartrate (LOPRESSOR) 25 MG tablet TAKE 1 TABLET BY MOUTH 2 TIMES DAILY. (Patient taking differently: Take 50 mg by mouth 2 (two) times daily. ) 180 tablet 1  . Multiple Vitamins-Minerals (MULTIVITAMIN PO) Take 1 tablet by mouth daily.    . tamsulosin (FLOMAX) 0.4 MG CAPS capsule Take 0.8 mg by mouth at bedtime.      No current facility-administered medications for this visit.     Allergies:   Ace inhibitors and Xarelto [rivaroxaban]    Social History:  The patient  reports that he has never smoked. He has never used smokeless tobacco. He reports that he does not drink alcohol or use drugs.   Family History:  The patient's family history includes Atrial fibrillation in his father; Cancer in his sister; Liver disease in his mother; Renal Disease in his mother; Stroke in his mother.    ROS:  Please see the history of present illness.   Otherwise, review of systems are positive for none.  All other systems are reviewed and negative.    PHYSICAL EXAM: VS:  BP 131/66   Pulse 77   Ht 5\' 8"  (1.727 m)   Wt 283 lb 6.4 oz (128.5 kg)   BMI 43.09  kg/m  , BMI Body mass index is 43.09 kg/m. GENERAL:  Well appearing HEENT: Pupils equal round and reactive, fundi not visualized, oral mucosa unremarkable NECK:  No jugular venous distention, waveform within normal limits, carotid upstroke brisk and symmetric, no bruits LUNGS:  Clear to auscultation bilaterally HEART:  RRR.  PMI not displaced or sustained,S1 and S2 within normal limits, no S3, no S4, no clicks, no rubs, no murmurs ABD:  Flat, positive bowel sounds normal in frequency in pitch, no bruits, no rebound, no guarding, no midline pulsatile mass, no hepatomegaly, no splenomegaly EXT:  2 plus pulses throughout, no edema, no cyanosis no clubbing SKIN:  No rashes no nodules NEURO:  Cranial nerves II through XII grossly intact, motor grossly intact throughout PSYCH:  Cognitively intact, oriented to person place and time    EKG:  EKG is not ordered today.   04/22/16: Sinus rhythm rate 67 bpm.  RSR' in V1. 05/20/16: Sinus rhythm.  Rate 75 bpm.  PACs.  06/29/17: Sinus rhythm.  Rate 83 bpm.  IVCD.  10/25/17: Atrial fibrillation.  Rate 115 bpm.  LAFB.   12/20/17: Sinus rhythm.  Sinus arrhythmia.  First-degree AV block.  Ventricular rate 68 bpm.  LAFB.  Echo 12/11/15: Study Conclusions  - Left ventricle: The cavity size was normal. There was severe   focal basal hypertrophy of the septum. Systolic function was   normal. The estimated ejection fraction was in the range of 60%   to 65%. Wall motion was normal; there were no regional wall   motion abnormalities. Features are consistent with a pseudonormal   left ventricular filling pattern, with concomitant abnormal   relaxation and increased filling pressure (grade 2 diastolic   dysfunction). - Aortic valve: Trileaflet; mildly thickened, mildly calcified   leaflets. There was trivial regurgitation. - Aorta: Aortic root dimension: 41 mm (ED). - Ascending aorta: The ascending aorta was mildly dilated. - Left atrium: The atrium was  moderately dilated.   Anterior-posterior dimension: 55 mm. Volume/bsa, ES, (1-plane   Simpson&'s, A2C): 40.2 ml/m^2.  Echo 07/08/15: Study Conclusions  - Left ventricle: The cavity size was normal. Wall thickness was increased in a pattern of moderate LVH. Systolic function was normal. The estimated ejection fraction was in the range of 60% to 65%. The study is not technically sufficient to allow evaluation of LV diastolic function. - Aortic valve: Trileaflet. Sclerosis without stenosis. There was mild regurgitation. - Mitral valve: Mildly thickened leaflets . There was mild regurgitation. - Left atrium: Moderately dilated at 43 ml/m2. - Right atrium: The atrium was mildly dilated. - Tricuspid valve: There was mild regurgitation. - Pulmonary arteries: PA peak pressure: 31 mm Hg (S). - Inferior vena cava: The vessel was normal in size. The respirophasic diameter changes were in the normal range (= 50%), consistent with normal central venous pressure.  Impressions:  - LVEF 60-65%, moderate LVH, normal wall motion, aortic valve sclerosis with mild AI, MR and TR, RVSP 31 mmHg., moderate LAE, mild RAE.   Recent Labs: 06/29/2017: ALT 21; TSH 3.800 10/25/2017: BUN 28; Creatinine, Ser 1.17; Hemoglobin 12.0; Magnesium 1.4; Platelets 207; Potassium 4.7; Sodium 145    Lipid Panel No results found for: CHOL, TRIG, HDL, CHOLHDL, VLDL, LDLCALC, LDLDIRECT    Wt Readings  from Last 3 Encounters:  12/30/17 283 lb 6.4 oz (128.5 kg)  12/20/17 298 lb 12.8 oz (135.5 kg)  10/28/17 284 lb (128.8 kg)     Other studies Reviewed: Additional studies/ records that were reviewed today include: n/a  ASSESSMENT AND PLAN:  # Atrial fibrillation/flutter: # PVCs: Eric Mayer is currently in sinus rhythm but has been going in and out of atrial fibrillation.  I suspect this may be due to electrolyte abnormalities after taking metolazone for 3 days.  We will check a basic metabolic  panel and a magnesium today.  He is currently back in sinus rhythm.  Continue amiodarone, diltiazem, metoprolol, and Eliquis.  This patients CHA2DS2-VASc Score and unadjusted Ischemic Stroke Rate (% per year) is equal to 2.2 % stroke rate/year from a score of 2  Above score calculated as 1 point each if present [CHF, HTN, DM, Vascular=MI/PAD/Aortic Plaque, Age if 65-74, or Male]  Above score calculated as 2 points each if present [Age > 75, or Stroke/TIA/TE]  # Hypertension:  BP well controlled on losartan, metoprolol, and diltiazem.  He has been inadvertently taking 100 mg instead of 50.  He will reduce this to 50 mg.  Check BMP as above.  # Acute on chronic diastolic heart failure: Volume status is much better after taking metolazone.  However he took it for 3 days straight and became intravascularly volume depleted.  He will hold his bumetanide today and resume tomorrow.  Check basic metabolic panel and magnesium as above.  He will use metolazone only as needed and only for 1 day to time.    Current medicines are reviewed at length with the patient today.  The patient does not have concerns regarding medicines.  The following changes have been made: reduce losartan to 50mg  daily  Labs/ tests ordered today include:  Orders Placed This Encounter  Procedures  . Basic metabolic panel  . Magnesium     Disposition:   FU with Dr. Oval Linsey in 2 months.    Signed, [name], MD  12/30/2017 12:54 PM    [place]

## 2018-01-10 ENCOUNTER — Ambulatory Visit (INDEPENDENT_AMBULATORY_CARE_PROVIDER_SITE_OTHER): Payer: Medicare Other | Admitting: Emergency Medicine

## 2018-01-10 ENCOUNTER — Encounter: Payer: Self-pay | Admitting: Emergency Medicine

## 2018-01-10 DIAGNOSIS — G4733 Obstructive sleep apnea (adult) (pediatric): Secondary | ICD-10-CM

## 2018-01-10 DIAGNOSIS — I5032 Chronic diastolic (congestive) heart failure: Secondary | ICD-10-CM

## 2018-01-10 DIAGNOSIS — E6609 Other obesity due to excess calories: Secondary | ICD-10-CM

## 2018-01-10 DIAGNOSIS — J45909 Unspecified asthma, uncomplicated: Secondary | ICD-10-CM

## 2018-01-10 NOTE — Progress Notes (Signed)
Subjective:    Patient ID: Eric Mayer, male    DOB: 05-13-1941, 77 y.o.   MRN: [id number]  HPI 77 yo never smoker with A fib on amiodarone since 2 months ago, HTN + chronic diastolic CHF, prostate CA, remote sarcoma s/p excision and XRT, OSA on CPAP reliably, hx gastric bypass.   I have followed him for his obstructive sleep apnea and also for dyspnea.  He had pulmonary function testing in January 2018 that showed severe obstruction with normal lung volumes.  Interestingly his flow volume loop suggestive of fixed intrathoracic obstruction.  For this reason we undertook a bronchoscopy in February 2018 but there was no evidence for an airways abnormality.  He has also had some upper airway instability, hoarseness, etc.  He reports that since last time he is doing fairly well, but he has had some intermittent dyspnea associated with hypotension and his A fib. He has some persistent exertional dyspnea, remains on amiodarone. Last time we stopped the Symbicort, he doesn't believe that he has missed it. Still has albuterol available, uses occasionally, may be helpful.   CPAP compliance has been great, have his download data here today. Equipment in good repair. He feels that he is benefiting well. He has some daytime sleepiness. He does nap during the day.  He sleeps through the night. Fatigue and dyspnea still hold him back. He is about to restart his exercise routine, has a pool. [employer] wt 270, has ben considerably above this lately, adjusting diuretics.      Review of Systems  Constitutional: Negative.  Negative for fever and unexpected weight change.  HENT: Positive for rhinorrhea. Negative for congestion, dental problem, ear pain, nosebleeds, postnasal drip, sinus pressure, sneezing, sore throat and trouble swallowing.   Eyes: Negative.  Negative for redness and itching.  Respiratory: Positive for cough and shortness of breath. Negative for chest tightness and wheezing.   Cardiovascular:  Positive for leg swelling. Negative for palpitations.  Gastrointestinal: Negative.  Negative for nausea and vomiting.  Genitourinary: Negative.  Negative for dysuria.  Musculoskeletal: Negative.  Negative for joint swelling.  Skin: Negative.  Negative for rash.  Neurological: Negative.  Negative for headaches.  Hematological: Negative.  Does not bruise/bleed easily.  Psychiatric/Behavioral: Negative for dysphoric mood. The patient is not nervous/anxious.     Past Medical History:  Diagnosis Date  . Anemia 1980s X 1  . Arthritis    "hands, knees, hips, ankles" (07/31/2015)  . Atrial flutter ([place]) 07/31/2015  . Chronic diastolic heart failure ([place])    a. 11/2015: Echo w/ EF of 60-65%, no WMA, Grade 2 DD, trivial AR, ascending aorta mildly dilated.   . Depression   . History of cardiovascular stress test    a. 03/2015: Dobutamine Stress Echo with no evidence of ischemia.   [name] History of gastric bypass   . History of peptic ulcer    1980's  . History of radiation therapy 1993   sarcoma of left groin, tx at [facility]  . History of sarcoma    1993  LEFT GOIN--  S/P SURGERY, RADIATION AND CHEMO IN [place]  . Hypertension   . Hypogonadism in male   . Incomplete right bundle branch block   . 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 Nocturia   . OSA on CPAP   . PAF (paroxysmal atrial fibrillation) ([place])    prior CARDIOLOGIST--  DR Tressia Miners TURNER/   now monitored by dr Jenny Reichmann griffin  . Primary prostate adenocarcinoma ([place]) DX 07/08/14   Gleason 7,  stage T1c  . PVC's (premature ventricular contractions) 11/21/2015  . 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 Weakness of left leg    SECONDARY TO SURGICAL NERVE INJURY OF LEFT GOIN  . Wears glasses      Family History  Problem Relation Age of Onset  . Liver disease Mother   . Renal Disease Mother   . Stroke Mother   . Atrial fibrillation Father   .  Cancer Sister      Social History   Socioeconomic History  . Marital status: Married    Spouse name: Not on file  . Number of children: Not on file  . Years of education: Not on file  . Highest education level: Not on file  Occupational History  . Not on file  Social Needs  . Financial resource strain: Not on file  . Food insecurity:    Worry: Not on file    Inability: Not on file  . Transportation needs:    Medical: Not on file    Non-medical: Not on file  Tobacco Use  . Smoking status: Never Smoker  . Smokeless tobacco: Never Used  Substance and Sexual Activity  . Alcohol use: No  . Drug use: No  . Sexual activity: Not Currently  Lifestyle  . Physical activity:    Days per week: Not on file    Minutes per session: Not on file  . Stress: Not on file  Relationships  . Social connections:    Talks on phone: Not on file    Gets together: Not on file    Attends religious service: Not on file    Active member of club or organization: Not on file    Attends meetings of clubs or organizations: Not on file    Relationship status: Not on file  . Intimate partner violence:    Fear of current or ex partner: Not on file    Emotionally abused: Not on file    Physically abused: Not on file    Forced sexual activity: Not on file  Other Topics Concern  . Not on file  Social History Narrative  . 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  lived in [place], [place]; has lived around [place] Reactions  . Ace Inhibitors Other (See Comments)    cough  . Xarelto [Rivaroxaban] Other (See Comments)    Joint pain           Objective:   Physical Exam Vitals:   01/10/18 1205  BP: 136/86  Pulse: 78  SpO2: 95%  Weight: 292 lb (132.5 kg)  Height: 5\' 9"  (1.753 m)   Gen: Pleasant, overwt man, in no  distress,  normal affect  ENT: No lesions,  mouth clear,  oropharynx clear, no postnasal drip  Neck: No JVD, no stridor  Lungs: No use of accessory muscles, clear, some B bronchial BS, no wheeze  Cardiovascular: RRR, heart sounds normal, no murmur or gallops, no peripheral edema  Musculoskeletal: well-healed scars on his knees, using a cane  Neuro: alert, non focal  Skin: Warm, no lesions or rashes     Assessment & Plan:  Sleep apnea-on C-Pap  Please continue to wear your CPAP reliably every night. Follow with Dr Lamonte Sakai in 12 months or sooner if you have any problems  Chronic diastolic CHF (congestive heart failure) ([place]) Continue your diuretic medications as directed by cardiology.  Intrinsic asthma Keep albuterol available to use 2 puffs if needed for shortness of breath, wheezing, chest tightness. We will not start any scheduled inhaled medication at this time.  Obesity Agree with slowly and carefully increasing your exercise to build up your stamina and functional capacity.  Baltazar Apo, MD, PhD 01/10/2018, 12:28 PM [facility] [phone] or if no answer [phone]

## 2018-01-10 NOTE — Assessment & Plan Note (Signed)
Continue your diuretic medications as directed by cardiology.

## 2018-01-10 NOTE — Assessment & Plan Note (Signed)
Keep albuterol available to use 2 puffs if needed for shortness of breath, wheezing, chest tightness. We will not start any scheduled inhaled medication at this time.

## 2018-01-10 NOTE — Assessment & Plan Note (Signed)
Please continue to wear your CPAP reliably every night Follow with Dr. Siyah Mault in 12 months or sooner if you have any problems.  

## 2018-01-10 NOTE — Patient Instructions (Signed)
Keep albuterol available to use 2 puffs if needed for shortness of breath, wheezing, chest tightness. We will not start any scheduled inhaled medication at this time. Please continue to wear your CPAP reliably every night. Continue your diuretic medications as directed by cardiology. Agree with slowly and carefully increasing your exercise to build up your stamina and functional capacity. Follow with Dr Lamonte Sakai in 12 months or sooner if you have any problems

## 2018-01-10 NOTE — Assessment & Plan Note (Signed)
Agree with slowly and carefully increasing your exercise to build up your stamina and functional capacity.

## 2018-01-18 ENCOUNTER — Encounter: Payer: Self-pay | Admitting: Cardiovascular Disease

## 2018-01-19 ENCOUNTER — Telehealth: Payer: Self-pay | Admitting: Cardiovascular Disease

## 2018-01-19 NOTE — Telephone Encounter (Signed)
OK to resume Eliquis based on his GI doctor's recommendation

## 2018-01-19 NOTE — Telephone Encounter (Signed)
Pt calling    Pt had colonoscopy 6.5.19 and need to know when start his Eliquis back. Please advise

## 2018-01-19 NOTE — Telephone Encounter (Signed)
Returned call to patient he stated he had colonoscopy yesterday,polyps were removed.Stated he was advised to hold eliquis and aspirin 7 days after procedure.He wanted to make sure Dr.[place] agreed.Message sent to [place].

## 2018-01-19 NOTE — Telephone Encounter (Signed)
Advised patient, verbalized understanding  

## 2018-03-08 ENCOUNTER — Encounter: Payer: Self-pay | Admitting: Cardiovascular Disease

## 2018-03-08 ENCOUNTER — Encounter: Payer: Self-pay | Admitting: *Deleted

## 2018-03-08 ENCOUNTER — Ambulatory Visit (INDEPENDENT_AMBULATORY_CARE_PROVIDER_SITE_OTHER): Payer: Medicare Other | Admitting: Cardiovascular Disease

## 2018-03-08 VITALS — BP 130/74 | HR 83 | Ht 70.0 in | Wt 288.6 lb

## 2018-03-08 DIAGNOSIS — I5032 Chronic diastolic (congestive) heart failure: Secondary | ICD-10-CM

## 2018-03-08 DIAGNOSIS — E6609 Other obesity due to excess calories: Secondary | ICD-10-CM

## 2018-03-08 DIAGNOSIS — Z7901 Long term (current) use of anticoagulants: Secondary | ICD-10-CM

## 2018-03-08 DIAGNOSIS — I10 Essential (primary) hypertension: Secondary | ICD-10-CM

## 2018-03-08 DIAGNOSIS — I480 Paroxysmal atrial fibrillation: Secondary | ICD-10-CM

## 2018-03-08 MED ORDER — METOLAZONE 2.5 MG PO TABS: ORAL_TABLET | ORAL | 3 refills | Status: DC

## 2018-03-08 MED ORDER — LOSARTAN POTASSIUM 25 MG PO TABS: 25.0000 mg | ORAL_TABLET | Freq: Every day | ORAL | 3 refills | Status: DC

## 2018-03-08 NOTE — Progress Notes (Signed)
Cardiology Office Note   Date:  03/08/2018   ID:  Eric Mayer, DOB 11/02/1940, MRN [id number]  PCP:  [name], MD  Cardiologist:   [name], MD   Chief Complaint  Patient presents with  . Dizziness    when standing up too fast.      History of Present Illness: Eric Mayer is a 77 y.o. male with HTN, paroxysmal atrial fibrillation, chronic diastolic heart failure, OSA on CPAP, asthma, BPPV, and prostate adenocarcinoma here for follow up.  [name] initially presented 03/2015 with dyspnea on exertion and chronic lower extremity edema.  He had a dobutamine stress echo 03/27/15 that was negative for ischemia.  He had an echo 06/2015 that revealed LVEF 60-65% with mild aortic regurgitation and mitral regurgitation.  They were unable to evaluate diastolic function.  He was admitted 07/2015 for acute on chronic diastolic heart failure and diuresed with IV lasix. He also underwent DCCV.  [name] was seen in clinic 04/2016 and reported fatigue, shortness of breath, weight gain, and low blood pressures.  At that appointment he was switched from Lasix to Bumex and his valsartan was reduced. His weight is labile and fluctuates with dieteary changes.  He has been very responsive to extra doses of Bumex as needed.  [name] saw [name] 07/21/16 with increased dyspnea on exertion.  He was noted to be back in atrial fibrillation in the setting of not wearing his CPAP for the preceding week.  He was started on amiodarone 200mg  and metoprolol was reduced to 25mg  bid.  He converted back to sinus rhythm and diuresed 10 lb after returning to sinus rhythm.  Given that he was on amiodarone he was referred for PFTs 08/20/16 that revealed severe obstruction and likely moderate restriction.  There is also moderate diffusion defect.  He was referred to pulmonology and saw Dr. Louis Meckel. He underwent bronchoscopy on 09/21/16 which showed no intrathoracic obstruction or tracheal  abnormality. He was started on Symbicort.  He was also referred to atrial fibrillation for consideration of alternatives to amiodarone. At this time there was not felt to be any pharmacologic alternatives to amiodarone. He was thought to be a poor candidate for ablation.    [name] had recurrent atrial fibrillation 09/2017.  He missed two doses of Eliquis so he couldn't have DCCV without TEE.  Losartan was reduced and metoprolol was increased.  He underwent TEE/DCCV on 3/15.   Since his last appointment [name] has been doing well.  He continues to have exertional dyspnea that limits his ability to exercise.   He goes to the gym 3 times per week and does water aerobics.  Walking to the back of the gym gets very short of breath and he has to stop and rest.  He also notes that he is unsteady on his feet.  He was supposed to go on a cruise in September but is unable to do so.  His lower extremity edema has been stable and he denies orthopnea or PND.  His weight has been stable.  He has not had any metolazone in the last 3 weeks.  When he does take it he gets dehydrated.  He notices that he gets very dizzy when standing and has near syncope.     Past Medical History:  Diagnosis Date  . Anemia 1980s X 1  . Arthritis    "hands, knees, hips, ankles" (07/31/2015)  . Atrial flutter ([place]) 07/31/2015  . Chronic  diastolic heart failure ([place])    a. 11/2015: Echo w/ EF of 60-65%, no WMA, Grade 2 DD, trivial AR, ascending aorta mildly dilated.   . Depression   . History of cardiovascular stress test    a. 03/2015: Dobutamine Stress Echo with no evidence of ischemia.   [name] History of gastric bypass   . History of peptic ulcer    1980's  . History of radiation therapy 1993   sarcoma of left groin, tx at [facility]  . History of sarcoma    1993  LEFT GOIN--  S/P SURGERY, RADIATION AND CHEMO IN [place]  . Hypertension   . Hypogonadism in male   . Incomplete right bundle branch block   . 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 Nocturia   . OSA on CPAP   . PAF (paroxysmal atrial fibrillation) ([place])    prior CARDIOLOGIST-- DR Tressia Miners TURNER/   now monitored by dr Jenny Reichmann griffin  . Primary prostate adenocarcinoma ([place]) DX 07/08/14   Gleason 7,  stage T1c  . PVC's (premature ventricular contractions) 11/21/2015  . Sarcoma ([place]) ~ 1993/1994   "of groin"  . Weakness of left leg    SECONDARY TO SURGICAL NERVE INJURY OF LEFT GOIN  . Wears glasses     Past Surgical History:  Procedure Laterality Date  . CARDIAC CATHETERIZATION  08-12-2003  dr Tressia Miners turner   Normal coronary arteries, normal wall motion, no sig. abnormalities  . CARDIOVERSION N/A 08/04/2015   Procedure: CARDIOVERSION;  Surgeon: [name], MD;  Location: [facility];  Service: Cardiovascular;  Laterality: N/A;  . CARDIOVERSION N/A 10/28/2017   Procedure: CARDIOVERSION;  Surgeon: Larey Dresser, MD;  Location: [facility] ENDOSCOPY;  Service: Cardiovascular;  Laterality: N/A;  . COLONOSCOPY  07-03-2002  . [place]  . FRACTURE SURGERY    . GROIN DISSECTION Left 1992   [place]   SARCOMA SURGERY  . INGUINAL HERNIA REPAIR Right 1950s?  . INTESTINAL BYPASS  1976   GASTRIC FOR OBESITY  . JOINT REPLACEMENT    . PATELLA FRACTURE SURGERY Left ~ 1995   "broke it twice; only had OR once"  . PROSTATE BIOPSY  06/2014  . RADIOACTIVE SEED IMPLANT N/A 10/17/2014   Procedure: RADIOACTIVE SEED IMPLANT    ;  Surgeon: Ailene Rud, MD;  Location: [facility];  Service: Urology;  Laterality: N/A;   68 SEEDS IMPLANTED   . TEE WITHOUT CARDIOVERSION N/A 10/28/2017   Procedure: TRANSESOPHAGEAL ECHOCARDIOGRAM (TEE);  Surgeon: Larey Dresser, MD;  Location: [facility] ENDOSCOPY;  Service: Cardiovascular;  Laterality: N/A;  . TONSILLECTOMY  1950s  . TOTAL KNEE ARTHROPLASTY Right 12/17/2014   Procedure: TOTAL KNEE ARTHROPLASTY;   Surgeon: Melrose Nakayama, MD;  Location: [facility];  Service: Orthopedics;  Laterality: Right;  . TRANSTHORACIC ECHOCARDIOGRAM  08-08-2003   moderate LVH/  ef 55-65%/  mild MR/  moderate LAE/  trivial TR/  trivial pericardial effusion posterior to the heart  . [place]  . VIDEO BRONCHOSCOPY Bilateral 09/21/2016   Procedure: VIDEO BRONCHOSCOPY WITHOUT FLUORO;  Surgeon: Collene Gobble, MD;  Location: [facility];  Service: Cardiopulmonary;  Laterality: Bilateral;     Current Outpatient Medications  Medication Sig Dispense Refill  . acetaminophen (TYLENOL) 325 MG tablet Take 2 tablets (650 mg total) by mouth every 4 (four) hours as needed for headache  or mild pain.    [name] amiodarone (PACERONE) 200 MG tablet TAKE 1 TABLET BY MOUTH DAILY. (Patient taking differently: TAKE 200 MG BY MOUTH DAILY.) 90 tablet 3  . bumetanide (BUMEX) 2 MG tablet TAKE 1 TABLET BY MOUTH DAILY AND AN EXTRA TABLET FOR WEIGHT GAIN 2 POUNDS DAILY OR 5 POUNDS WEEKLY (Patient taking differently: Take 2 mg by mouth See admin instructions. TAKE 2 MG BY MOUTH DAILY AND AN EXTRA 2 MG FOR WEIGHT GAIN 2 POUNDS DAILY OR 5 POUNDS WEEKLY) 180 tablet 3  . buPROPion (WELLBUTRIN XL) 300 MG 24 hr tablet Take 300 mg by mouth every morning.    . cholecalciferol (VITAMIN D) 1000 UNITS tablet Take 1,000 Units by mouth daily.    . Coenzyme Q10 (CO Q 10) 100 MG CAPS Take 300 mg by mouth every evening.     . cyanocobalamin (,VITAMIN B-12,) 1000 MCG/ML injection Inject 1,000 mcg into the muscle every 30 (thirty) days.    [name] diltiazem (TIAZAC) 180 MG 24 hr capsule Take 1 capsule (180 mg total) by mouth every morning. 14 capsule 1  . ELIQUIS 5 MG TABS tablet TAKE 1 TABLET BY MOUTH 2 TIMES DAILY. (Patient taking differently: TAKE 5 MG BY MOUTH 2 TIMES DAILY.) 180 tablet 2  . escitalopram (LEXAPRO) 10 MG tablet Take 10 mg by mouth daily.    [name] losartan (COZAAR) 100 MG tablet Take 50 mg by mouth daily.     . magnesium oxide (MAG-OX) 400 (241.3  Mg) MG tablet Take 1 tablet (400 mg total) by mouth 2 (two) times daily. 180 tablet 1  . metolazone (ZAROXOLYN) 5 MG tablet 1 TABLET TODAY AND THEN AS NEEDED FOR WEIGHT GAIN  ABOVE 7 POUNDS 10 tablet 0  . metoprolol tartrate (LOPRESSOR) 25 MG tablet TAKE 1 TABLET BY MOUTH 2 TIMES DAILY. (Patient taking differently: Take 50 mg by mouth 2 (two) times daily. ) 180 tablet 1  . Multiple Vitamins-Minerals (MULTIVITAMIN PO) Take 1 tablet by mouth daily.    . tamsulosin (FLOMAX) 0.4 MG CAPS capsule Take 0.8 mg by mouth at bedtime.      No current facility-administered medications for this visit.     Allergies:   Ace inhibitors and Xarelto [rivaroxaban]    Social History:  The patient  reports that he has never smoked. He has never used smokeless tobacco. He reports that he does not drink alcohol or use drugs.   Family History:  The patient's family history includes Atrial fibrillation in his father; Cancer in his sister; Liver disease in his mother; Renal Disease in his mother; Stroke in his mother.    ROS:  Please see the history of present illness.   Otherwise, review of systems are positive for none.   All other systems are reviewed and negative.    PHYSICAL EXAM: VS:  BP 130/74   Pulse 83   Ht 5\' 10"  (1.778 m)   Wt 288 lb 9.6 oz (130.9 kg)   BMI 41.41 kg/m  , BMI Body mass index is 41.41 kg/m. GENERAL:  Well appearing HEENT: Pupils equal round and reactive, fundi not visualized, oral mucosa unremarkable NECK:  No jugular venous distention, waveform within normal limits, carotid upstroke brisk and symmetric, no bruits LUNGS:  Clear to auscultation bilaterally HEART:  RRR.  PMI not displaced or sustained,S1 and S2 within normal limits, no S3, no S4, no clicks, no rubs, no murmurs ABD:  Flat, positive bowel sounds normal in frequency in pitch, no bruits, no  rebound, no guarding, no midline pulsatile mass, no hepatomegaly, no splenomegaly EXT:  2 plus pulses throughout, 1+ LE edema  bilaterally, no cyanosis no clubbing SKIN:  No rashes no nodules NEURO:  Cranial nerves II through XII grossly intact, motor grossly intact throughout PSYCH:  Cognitively intact, oriented to person place and time   EKG:  EKG is not ordered today.   04/22/16: Sinus rhythm rate 67 bpm.  RSR' in V1. 05/20/16: Sinus rhythm.  Rate 75 bpm.  PACs.  06/29/17: Sinus rhythm.  Rate 83 bpm.  IVCD.  10/25/17: Atrial fibrillation.  Rate 115 bpm.  LAFB.   12/20/17: Sinus rhythm.  Sinus arrhythmia.  First-degree AV block.  Ventricular rate 68 bpm.  LAFB.  Echo 12/11/15: Study Conclusions  - Left ventricle: The cavity size was normal. There was severe   focal basal hypertrophy of the septum. Systolic function was   normal. The estimated ejection fraction was in the range of 60%   to 65%. Wall motion was normal; there were no regional wall   motion abnormalities. Features are consistent with a pseudonormal   left ventricular filling pattern, with concomitant abnormal   relaxation and increased filling pressure (grade 2 diastolic   dysfunction). - Aortic valve: Trileaflet; mildly thickened, mildly calcified   leaflets. There was trivial regurgitation. - Aorta: Aortic root dimension: 41 mm (ED). - Ascending aorta: The ascending aorta was mildly dilated. - Left atrium: The atrium was moderately dilated.   Anterior-posterior dimension: 55 mm. Volume/bsa, ES, (1-plane   Simpson&'s, A2C): 40.2 ml/m^2.  Echo 07/08/15: Study Conclusions  - Left ventricle: The cavity size was normal. Wall thickness was increased in a pattern of moderate LVH. Systolic function was normal. The estimated ejection fraction was in the range of 60% to 65%. The study is not technically sufficient to allow evaluation of LV diastolic function. - Aortic valve: Trileaflet. Sclerosis without stenosis. There was mild regurgitation. - Mitral valve: Mildly thickened leaflets . There was mild regurgitation. - Left atrium:  Moderately dilated at 43 ml/m2. - Right atrium: The atrium was mildly dilated. - Tricuspid valve: There was mild regurgitation. - Pulmonary arteries: PA peak pressure: 31 mm Hg (S). - Inferior vena cava: The vessel was normal in size. The respirophasic diameter changes were in the normal range (= 50%), consistent with normal central venous pressure.  Impressions:  - LVEF 60-65%, moderate LVH, normal wall motion, aortic valve sclerosis with mild AI, MR and TR, RVSP 31 mmHg., moderate LAE, mild RAE.   Recent Labs: 06/29/2017: ALT 21; TSH 3.800 10/25/2017: Hemoglobin 12.0; Platelets 207 12/30/2017: BUN 54; Creatinine, Ser 0.99; Magnesium 2.3; Potassium 4.0; Sodium 141    Lipid Panel No results found for: CHOL, TRIG, HDL, CHOLHDL, VLDL, LDLCALC, LDLDIRECT    Wt Readings from Last 3 Encounters:  03/08/18 288 lb 9.6 oz (130.9 kg)  01/10/18 292 lb (132.5 kg)  12/30/17 283 lb 6.4 oz (128.5 kg)     Other studies Reviewed: Additional studies/ records that were reviewed today include: n/a  ASSESSMENT AND PLAN:  # Atrial fibrillation/flutter: # PVCs: Mr. Damiano remains in sinus rhythm.  Continue amiodarone, diltiazem, metoprolol, and Eliquis.  This patients CHA2DS2-VASc Score and unadjusted Ischemic Stroke Rate (% per year) is equal to 2.2 % stroke rate/year from a score of 2  Above score calculated as 1 point each if present [CHF, HTN, DM, Vascular=MI/PAD/Aortic Plaque, Age if 65-74, or Male]  Above score calculated as 2 points each if present [Age > 75, or Stroke/TIA/TE]  #  Hypertension:  BP well controlled on losartan, metoprolol, and diltiazem.  However he has positional dizziness.  Reduce losartan to 25mg  and change goal to <140/90.  # Chronic diastolic heart failure: Volume status is stable.  He hasn't needed any metolazone lately.  Reduce losartan as above.  Continue lasix and metoprolol.  He will  reduce metolazone to 2.5 mg as needed instead of 5 mg.    Current  medicines are reviewed at length with the patient today.  The patient does not have concerns regarding medicines.  The following changes have been made: reduce losartan to 25 mg daily and metolazone 2.5mg  prn.  Labs/ tests ordered today include:  No orders of the defined types were placed in this encounter.    Disposition:   FU with Dr. Oval Linsey in 2 months.    Signed, [name], MD  03/08/2018 9:18 AM    [place]

## 2018-03-08 NOTE — Patient Instructions (Addendum)
Medication Instructions:  DECREASE YOUR LOSARTAN TO 25 MG DAILY   DECREASE YOUR METOLAZONE TO 2.5 MG AS NEEDED   Labwork: NONE  Testing/Procedures: NONE  Follow-Up: Your physician recommends that you schedule a follow-up appointment in: 2 MONTHS   If you need a refill on your cardiac medications before your next appointment, please call your pharmacy.

## 2018-03-16 ENCOUNTER — Other Ambulatory Visit: Payer: Self-pay | Admitting: Cardiovascular Disease

## 2018-04-03 ENCOUNTER — Telehealth: Payer: Self-pay | Admitting: Cardiovascular Disease

## 2018-04-03 NOTE — Telephone Encounter (Signed)
Returned call to patient.He stated he has been having episodes of sob since 03/22/18.Stated he had another episode last night he had to walk out to his car,had to sit down he was so sob.He also felt dizzy,did not pass out. He had to stop metolazone made him too weak, he only took 2 doses.He has noticed abdominal swelling,no swelling in legs and feet.Appointment scheduled with Dr.Ceresco 04/06/18.Stated he would like to be seen sooner if possible.

## 2018-04-03 NOTE — Telephone Encounter (Signed)
New Message:      Pt c/o Shortness Of Breath: STAT if SOB developed within the last 24 hours or pt is noticeably SOB on the phone  1. Are you currently SOB (can you hear that pt is SOB on the phone)? No  2. How long have you been experiencing SOB? 2-3 weeks since 03/22/18  3. Are you SOB when sitting or when up moving around? Moving around  4. Are you currently experiencing any other symptoms? Pt states he is getting faint and weak

## 2018-04-04 ENCOUNTER — Encounter: Payer: Self-pay | Admitting: *Deleted

## 2018-04-05 ENCOUNTER — Ambulatory Visit: Payer: Medicare Other | Admitting: Cardiovascular Disease

## 2018-04-05 NOTE — Telephone Encounter (Signed)
Tried to call patient to offer appointment for today, sent message via mychart. Patient unable to make appointment today, will keep appointment tomorrow as scheduled.

## 2018-04-06 ENCOUNTER — Ambulatory Visit (INDEPENDENT_AMBULATORY_CARE_PROVIDER_SITE_OTHER): Payer: Medicare Other | Admitting: Cardiovascular Disease

## 2018-04-06 ENCOUNTER — Encounter: Payer: Self-pay | Admitting: *Deleted

## 2018-04-06 ENCOUNTER — Encounter: Payer: Self-pay | Admitting: Cardiovascular Disease

## 2018-04-06 VITALS — BP 115/62 | HR 106 | Ht 70.0 in | Wt 293.6 lb

## 2018-04-06 DIAGNOSIS — I4892 Unspecified atrial flutter: Secondary | ICD-10-CM | POA: Diagnosis not present

## 2018-04-06 DIAGNOSIS — R0602 Shortness of breath: Secondary | ICD-10-CM

## 2018-04-06 DIAGNOSIS — Z5181 Encounter for therapeutic drug level monitoring: Secondary | ICD-10-CM

## 2018-04-06 DIAGNOSIS — Z01812 Encounter for preprocedural laboratory examination: Secondary | ICD-10-CM

## 2018-04-06 DIAGNOSIS — I5032 Chronic diastolic (congestive) heart failure: Secondary | ICD-10-CM

## 2018-04-06 DIAGNOSIS — R5383 Other fatigue: Secondary | ICD-10-CM

## 2018-04-06 LAB — BASIC METABOLIC PANEL
BUN/Creatinine Ratio: 26 — ABNORMAL HIGH (ref 10–24)
BUN: 25 mg/dL (ref 8–27)
CO2: 25 mmol/L (ref 20–29)
Calcium: 9 mg/dL (ref 8.6–10.2)
Chloride: 104 mmol/L (ref 96–106)
Creatinine, Ser: 0.95 mg/dL (ref 0.76–1.27)
GFR calc Af Amer: 90 mL/min/{1.73_m2} (ref >59)
GFR calc non Af Amer: 78 mL/min/{1.73_m2} (ref >59)
Glucose: 87 mg/dL (ref 65–99)
Potassium: 4.1 mmol/L (ref 3.5–5.2)
Sodium: 144 mmol/L (ref 134–144)

## 2018-04-06 LAB — CBC WITH DIFFERENTIAL/PLATELET
Basophils Absolute: 0 10*3/uL (ref 0.0–0.2)
Basos: 0 %
EOS (ABSOLUTE): 0.3 10*3/uL (ref 0.0–0.4)
Eos: 4 %
Hematocrit: 38.1 % (ref 37.5–51.0)
Hemoglobin: 12 g/dL — ABNORMAL LOW (ref 13.0–17.7)
Immature Grans (Abs): 0 10*3/uL (ref 0.0–0.1)
Immature Granulocytes: 0 %
Lymphocytes Absolute: 1.7 10*3/uL (ref 0.7–3.1)
Lymphs: 22 %
MCH: 28.5 pg (ref 26.6–33.0)
MCHC: 31.5 g/dL (ref 31.5–35.7)
MCV: 91 fL (ref 79–97)
Monocytes Absolute: 0.5 10*3/uL (ref 0.1–0.9)
Monocytes: 7 %
Neutrophils Absolute: 5.1 10*3/uL (ref 1.4–7.0)
Neutrophils: 67 %
Platelets: 226 10*3/uL (ref 150–450)
RBC: 4.21 x10E6/uL (ref 4.14–5.80)
RDW: 14.8 % (ref 12.3–15.4)
WBC: 7.7 10*3/uL (ref 3.4–10.8)

## 2018-04-06 LAB — MAGNESIUM: Magnesium: 1.5 mg/dL — ABNORMAL LOW (ref 1.6–2.3)

## 2018-04-06 MED ORDER — METOPROLOL TARTRATE 50 MG PO TABS: 50.0000 mg | ORAL_TABLET | Freq: Two times a day (BID) | ORAL | 3 refills | Status: DC

## 2018-04-06 NOTE — Patient Instructions (Addendum)
Medication Instructions:  INCREASE YOUR METOPROLOL TO 50 MG TWICE A DAY   Labwork: BMET/CBC/MAGNESIUM TODAY   Testing/Procedures: YOU ARE SCHEDULED FOR A CARDIOVERSION ON 04/12/18   Your physician has requested that you have a lexiscan myoview. For further information please visit [email or web address]. Please follow instruction sheet, as given.  Follow-Up: Your physician recommends that you schedule a follow-up appointment in: [place]  Your physician recommends that you schedule a follow-up appointment in: [facility] 1 MONTH   Any Other Special Instructions Will Be Listed Below (If Applicable).                      You are scheduled for a  Cardioversion on 04/12/18 with Dr. Acie Fredrickson.  Please arrive at the [facility] (Main Entrance A) at [facility]: [address] [place], [place] [address] at 11:45. (1 hour prior to procedure unless lab work is needed; if lab work is needed arrive 1.5 hours ahead)  DIET: Nothing to eat or drink after midnight except a sip of water with medications (see medication instructions below)  Medication Instructions: Hold BUMEX   Continue your anticoagulant: ELIQUIS  You will need to continue your anticoagulant after your procedure until you are told by your  Provider that it is safe to stop   Labs: [place] must have a responsible person to drive you home and stay in the waiting area during your procedure. Failure to do so could result in cancellation.  Bring your insurance cards.  *Special Note: Every effort is made to have your procedure done on time. Occasionally there are emergencies that occur at the hospital that may cause delays. Please be patient if a delay does occur.

## 2018-04-06 NOTE — Progress Notes (Signed)
Cardiology Office Note   Date:  04/06/2018   ID:  [facility], DOB 23-Mar-1941, MRN [id number]  PCP:  [name], MD  Cardiologist:   [name], MD   No chief complaint on file.    History of Present Illness: Eric [facility] is a 77 y.o. male with HTN, paroxysmal atrial fibrillation/flutter, chronic diastolic heart failure, OSA on CPAP, asthma, BPPV, and prostate adenocarcinoma here for follow up.  [name] initially presented 03/2015 with dyspnea on exertion and chronic lower extremity edema.  He had a dobutamine stress echo 03/27/15 that was negative for ischemia.  He had an echo 06/2015 that revealed LVEF 60-65% with mild aortic regurgitation and mitral regurgitation.  They were unable to evaluate diastolic function.  He was admitted 07/2015 for acute on chronic diastolic heart failure and diuresed with IV lasix. He also underwent DCCV.  [name] was seen in clinic 04/2016 and reported fatigue, shortness of breath, weight gain, and low blood pressures.  At that appointment he was switched from Lasix to Bumex and his valsartan was reduced. His weight is labile and fluctuates with dieteary changes.  He has been very responsive to extra doses of Bumex as needed.  [name] saw [facility] 07/21/16 with increased dyspnea on exertion.  He was noted to be back in atrial fibrillation in the setting of not wearing his CPAP for the preceding week.  He was started on amiodarone 200mg  and metoprolol was reduced to 25mg  bid.  He converted back to sinus rhythm and diuresed 10 lb after returning to sinus rhythm.  Given that he was on amiodarone he was referred for PFTs 08/20/16 that revealed severe obstruction and likely moderate restriction.  There is also moderate diffusion defect.  He was referred to pulmonology and saw Dr. Louis Meckel. He underwent bronchoscopy on 09/21/16 which showed no intrathoracic obstruction or tracheal abnormality. He was started on Symbicort.  He was also  referred to atrial fibrillation for consideration of alternatives to amiodarone. At this time there was not felt to be any pharmacologic alternatives to amiodarone. He was thought to be a poor candidate for ablation.    [name] had recurrent atrial fibrillation 09/2017.  He missed two doses of Eliquis so he couldn't have DCCV without TEE.  Losartan was reduced and metoprolol was increased.  He underwent TEE/DCCV on 3/15.   At his last appointment he was doing well.  However he has been more short of breath for the last several weeks.  Hs BP has been around 130s/80s but his HR has been around 112 bpm.  He has been very short of breath, weak and dizzy.  His weight increased so he tried taking metolazone.  However he then had near syncope in the shower.  At some point his heart rate reduced to 70 and he felt much better.  His LE edema has been stable.  When his heart rate is elevated he also has chest discomfort.   Past Medical History:  Diagnosis Date  . Anemia 1980s X 1  . Arthritis    "hands, knees, hips, ankles" (07/31/2015)  . Atrial flutter ([place]) 07/31/2015  . Chronic diastolic heart failure ([place])    a. 11/2015: Echo w/ EF of 60-65%, no WMA, Grade 2 DD, trivial AR, ascending aorta mildly dilated.   . Depression   . History of cardiovascular stress test    a. 03/2015: Dobutamine Stress Echo with no evidence of ischemia.   [facility] History of gastric  bypass   . History of peptic ulcer    1980's  . History of radiation therapy 1993   sarcoma of left groin, tx at [facility]  . History of sarcoma    1993  LEFT GOIN--  S/P SURGERY, RADIATION AND CHEMO IN [place]  . Hypertension   . Hypogonadism in male   . Incomplete right bundle branch block   . 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 Nocturia   . OSA on CPAP   . PAF (paroxysmal atrial fibrillation) ([facility])    prior CARDIOLOGIST-- DR Tressia Miners TURNER/   now  monitored by dr Jenny Reichmann griffin  . Primary prostate adenocarcinoma ([facility]) DX 07/08/14   Gleason 7,  stage T1c  . PVC's (premature ventricular contractions) 11/21/2015  . Sarcoma ([facility]) ~ 1993/1994   "of groin"  . Weakness of left leg    SECONDARY TO SURGICAL NERVE INJURY OF LEFT GOIN  . Wears glasses     Past Surgical History:  Procedure Laterality Date  . CARDIAC CATHETERIZATION  08-12-2003  dr Tressia Miners turner   Normal coronary arteries, normal wall motion, no sig. abnormalities  . CARDIOVERSION N/A 08/04/2015   Procedure: CARDIOVERSION;  Surgeon: [name], MD;  Location: [facility];  Service: Cardiovascular;  Laterality: N/A;  . CARDIOVERSION N/A 10/28/2017   Procedure: CARDIOVERSION;  Surgeon: Larey Dresser, MD;  Location: [facility] ENDOSCOPY;  Service: Cardiovascular;  Laterality: N/A;  . COLONOSCOPY  07-03-2002  . [place]  . FRACTURE SURGERY    . GROIN DISSECTION Left 1992   [place]   SARCOMA SURGERY  . INGUINAL HERNIA REPAIR Right 1950s?  . INTESTINAL BYPASS  1976   GASTRIC FOR OBESITY  . JOINT REPLACEMENT    . PATELLA FRACTURE SURGERY Left ~ 1995   "broke it twice; only had OR once"  . PROSTATE BIOPSY  06/2014  . RADIOACTIVE SEED IMPLANT N/A 10/17/2014   Procedure: RADIOACTIVE SEED IMPLANT    ;  Surgeon: Ailene Rud, MD;  Location[facility];  Service: Urology;  Laterality: N/A;   68 SEEDS IMPLANTED   . TEE WITHOUT CARDIOVERSION N/A 10/28/2017   Procedure: TRANSESOPHAGEAL ECHOCARDIOGRAM (TEE);  Surgeon: Larey Dresser, MD;  Location: [facility];  Service: Cardiovascular;  Laterality: N/A;  . TONSILLECTOMY  1950s  . TOTAL KNEE ARTHROPLASTY Right 12/17/2014   Procedure: TOTAL KNEE ARTHROPLASTY;  Surgeon: Melrose Nakayama, MD;  Location: [place];  Service: Orthopedics;  Laterality: Right;  . TRANSTHORACIC ECHOCARDIOGRAM  08-08-2003   moderate LVH/  ef 55-65%/  mild MR/  moderate LAE/  trivial TR/  trivial pericardial effusion  posterior to the heart  . [facility]  . VIDEO BRONCHOSCOPY Bilateral 09/21/2016   Procedure: VIDEO BRONCHOSCOPY WITHOUT FLUORO;  Surgeon: Collene Gobble, MD;  Location: [facility];  Service: Cardiopulmonary;  Laterality: Bilateral;     Current Outpatient Medications  Medication Sig Dispense Refill  . acetaminophen (TYLENOL) 325 MG tablet Take 2 tablets (650 mg total) by mouth every 4 (four) hours as needed for headache or mild pain.    [facility] amiodarone (PACERONE) 200 MG tablet TAKE 1 TABLET BY MOUTH DAILY. (Patient taking differently: TAKE 200 MG BY MOUTH DAILY.) 90 tablet 3  . bumetanide (BUMEX) 2 MG tablet TAKE 1 TABLET BY MOUTH DAILY AND AN EXTRA TABLET FOR WEIGHT GAIN 2 POUNDS DAILY OR 5 POUNDS  WEEKLY (Patient taking differently: Take 2 mg by mouth See admin instructions. TAKE 2 MG BY MOUTH DAILY AND AN EXTRA 2 MG FOR WEIGHT GAIN 2 POUNDS DAILY OR 5 POUNDS WEEKLY) 180 tablet 3  . buPROPion (WELLBUTRIN XL) 300 MG 24 hr tablet Take 300 mg by mouth every morning.    . cyanocobalamin (,VITAMIN B-12,) 1000 MCG/ML injection Inject 1,000 mcg into the muscle every 30 (thirty) days.    [facility] diltiazem (TIAZAC) 180 MG 24 hr capsule Take 1 capsule (180 mg total) by mouth every morning. 14 capsule 1  . ELIQUIS 5 MG TABS tablet TAKE 1 TABLET BY MOUTH 2 TIMES DAILY. 180 tablet 1  . escitalopram (LEXAPRO) 10 MG tablet Take 10 mg by mouth daily.    [facility] losartan (COZAAR) 25 MG tablet Take 1 tablet (25 mg total) by mouth daily. 90 tablet 3  . magnesium oxide (MAG-OX) 400 (241.3 Mg) MG tablet Take 1 tablet (400 mg total) by mouth 2 (two) times daily. 180 tablet 1  . metoprolol tartrate (LOPRESSOR) 25 MG tablet TAKE 1 TABLET BY MOUTH 2 TIMES DAILY. (Patient taking differently: Take 50 mg by mouth 2 (two) times daily. ) 180 tablet 1  . tamsulosin (FLOMAX) 0.4 MG CAPS capsule Take 0.8 mg by mouth at bedtime.      No current facility-administered medications for this visit.     Allergies:   Ace  inhibitors and Xarelto [rivaroxaban]    Social History:  The patient  reports that he has never smoked. He has never used smokeless tobacco. He reports that he does not drink alcohol or use drugs.   Family History:  The patient's family history includes Atrial fibrillation in his father; Cancer in his sister; Liver disease in his mother; Renal Disease in his mother; Stroke in his mother.    ROS:  Please see the history of present illness.   Otherwise, review of systems are positive for none.   All other systems are reviewed and negative.    PHYSICAL EXAM: VS:  BP 115/62   Pulse (!) 106   Ht 5\' 10"  (1.778 m)   Wt 293 lb 9.6 oz (133.2 kg)   BMI 42.13 kg/m  , BMI Body mass index is 42.13 kg/m. GENERAL: Ill-appearing HEENT: Pupils equal round and reactive, fundi not visualized, oral mucosa unremarkable NECK:  No jugular venous distention, waveform within normal limits, carotid upstroke brisk and symmetric, no bruits LUNGS:  Clear to auscultation bilaterally HEART: Tachycardic  Regular rhythm. [facility]  PMI not displaced or sustained,S1 and S2 within normal limits, no S3, no S4, no clicks, no rubs, no murmurs ABD:  Flat, positive bowel sounds normal in frequency in pitch, no bruits, no rebound, no guarding, no midline pulsatile mass, no hepatomegaly, no splenomegaly EXT:  2 plus pulses throughout, 1+ LE edema bilaterally, no cyanosis no clubbing SKIN:  No rashes no nodules NEURO:  Cranial nerves II through XII grossly intact, motor grossly intact throughout PSYCH:  Cognitively intact, oriented to person place and time   EKG:  EKG is ordered today.   04/22/16: Sinus rhythm rate 67 bpm.  RSR' in V1. 05/20/16: Sinus rhythm.  Rate 75 bpm.  PACs.  06/29/17: Sinus rhythm.  Rate 83 bpm.  IVCD.  10/25/17: Atrial fibrillation.  Rate 115 bpm.  LAFB.   12/20/17: Sinus rhythm.  Sinus arrhythmia.  First-degree AV block.  Ventricular rate 68 bpm.  LAFB. 04/06/18: Atypical atrial flutter rate 106 bpm.    Echo  12/11/15: Study  Conclusions  - Left ventricle: The cavity size was normal. There was severe   focal basal hypertrophy of the septum. Systolic function was   normal. The estimated ejection fraction was in the range of 60%   to 65%. Wall motion was normal; there were no regional wall   motion abnormalities. Features are consistent with a pseudonormal   left ventricular filling pattern, with concomitant abnormal   relaxation and increased filling pressure (grade 2 diastolic   dysfunction). - Aortic valve: Trileaflet; mildly thickened, mildly calcified   leaflets. There was trivial regurgitation. - Aorta: Aortic root dimension: 41 mm (ED). - Ascending aorta: The ascending aorta was mildly dilated. - Left atrium: The atrium was moderately dilated.   Anterior-posterior dimension: 55 mm. Volume/bsa, ES, (1-plane   Simpson&'s, A2C): 40.2 ml/m^2.  Echo 07/08/15: Study Conclusions  - Left ventricle: The cavity size was normal. Wall thickness was increased in a pattern of moderate LVH. Systolic function was normal. The estimated ejection fraction was in the range of 60% to 65%. The study is not technically sufficient to allow evaluation of LV diastolic function. - Aortic valve: Trileaflet. Sclerosis without stenosis. There was mild regurgitation. - Mitral valve: Mildly thickened leaflets . There was mild regurgitation. - Left atrium: Moderately dilated at 43 ml/m2. - Right atrium: The atrium was mildly dilated. - Tricuspid valve: There was mild regurgitation. - Pulmonary arteries: PA peak pressure: 31 mm Hg (S). - Inferior vena cava: The vessel was normal in size. The respirophasic diameter changes were in the normal range (= 50%), consistent with normal central venous pressure.  Impressions:  - LVEF 60-65%, moderate LVH, normal wall motion, aortic valve sclerosis with mild AI, MR and TR, RVSP 31 mmHg., moderate LAE, mild RAE.   Recent Labs: 06/29/2017: ALT 21;  TSH 3.800 10/25/2017: Hemoglobin 12.0; Platelets 207 12/30/2017: BUN 54; Creatinine, Ser 0.99; Magnesium 2.3; Potassium 4.0; Sodium 141    Lipid Panel No results found for: CHOL, TRIG, HDL, CHOLHDL, VLDL, LDLCALC, LDLDIRECT    Wt Readings from Last 3 Encounters:  04/06/18 293 lb 9.6 oz (133.2 kg)  03/08/18 288 lb 9.6 oz (130.9 kg)  01/10/18 292 lb (132.5 kg)     Other studies Reviewed: Additional studies/ records that were reviewed today include: n/a  ASSESSMENT AND PLAN:  # Atrial fibrillation/flutter: # PVCs: Mr. Dooly is back in atrial flutter.  This is why he has been feeling poorly.  We will increase his metoprolol to 50mg  bid.  Continue diltiazem and Eliquis.  He has not missed any doses of Eliquis.  Plan for DCCV.  Given that he has been taking extra diuretic we will check a BMP.  He is already on amiodarone and having recurrent atrial flutter.  We will ask EP to evaluate for any other options or suggestions.    This patients CHA2DS2-VASc Score and unadjusted Ischemic Stroke Rate (% per year) is equal to 2.2 % stroke rate/year from a score of 2  Above score calculated as 1 point each if present [CHF, HTN, DM, Vascular=MI/PAD/Aortic Plaque, Age if 65-74, or Male]  Above score calculated as 2 points each if present [Age > 75, or Stroke/TIA/TE]  # Atypical Chest pain: Only occurs when tachycardic.  Plan for [facility] if he has chest pain once back in sinus rhythm.  # Hypertension:  BP well controlled on losartan, metoprolol, and diltiazem.  Losartan was reduced due to positional dizziness. Goal is <140/90.  # Chronic diastolic heart failure: Volume status is stable.  Continue BP management as above, bumex, and prn metolazone.   Current medicines are reviewed at length with the patient today.  The patient does not have concerns regarding medicines.  The following changes have been made: increase metoprolol  Labs/ tests ordered today include:  No orders of the  defined types were placed in this encounter.    Disposition:   FU with Dr. Oval Linsey in 1 month.    Signed, [name], MD  04/06/2018 10:14 AM    [place]

## 2018-04-07 ENCOUNTER — Encounter: Payer: Self-pay | Admitting: Cardiovascular Disease

## 2018-04-11 ENCOUNTER — Telehealth: Payer: Self-pay | Admitting: *Deleted

## 2018-04-11 MED ORDER — SODIUM CHLORIDE 0.9% FLUSH: 3.0000 mL | Freq: Two times a day (BID) | INTRAVENOUS | Status: DC

## 2018-04-11 MED ORDER — SODIUM CHLORIDE 0.9 % IV SOLN: 250.0000 mL | INTRAVENOUS | Status: DC

## 2018-04-11 MED ORDER — MAGNESIUM OXIDE 400 (241.3 MG) MG PO TABS: ORAL_TABLET | ORAL | 3 refills | Status: DC

## 2018-04-11 MED ORDER — SODIUM CHLORIDE 0.9% FLUSH: 3.0000 mL | INTRAVENOUS | Status: DC | PRN

## 2018-04-11 NOTE — Telephone Encounter (Signed)
-----   Message from [name], MD sent at 04/11/2018  2:32 PM EDT ----- Blood counts are stable.  Kidney function is normal.  Magnesium is low.  Increase magnesium to 2 tabs in the am and one in the evening.

## 2018-04-11 NOTE — Telephone Encounter (Signed)
Advised patient of lab results and medication changes. New Rx sent to pharmacy

## 2018-04-12 ENCOUNTER — Other Ambulatory Visit: Payer: Self-pay

## 2018-04-12 ENCOUNTER — Ambulatory Visit (HOSPITAL_COMMUNITY)
Admission: RE | Admit: 2018-04-12 | Discharge: 2018-04-12 | Disposition: A | Discharge status: Home / Self Care | Payer: Medicare Other | Source: Ambulatory Visit | Attending: Cardiovascular Disease | Admitting: Cardiovascular Disease

## 2018-04-12 ENCOUNTER — Encounter (HOSPITAL_COMMUNITY)
Admission: RE | Disposition: A | Discharge status: Home / Self Care | Payer: Self-pay | Source: Ambulatory Visit | Attending: Cardiovascular Disease

## 2018-04-12 ENCOUNTER — Ambulatory Visit (HOSPITAL_COMMUNITY): Payer: Medicare Other | Admitting: Anesthesiology

## 2018-04-12 ENCOUNTER — Encounter (HOSPITAL_COMMUNITY): Payer: Self-pay | Admitting: *Deleted

## 2018-04-12 DIAGNOSIS — I110 Hypertensive heart disease with heart failure: Secondary | ICD-10-CM | POA: Diagnosis not present

## 2018-04-12 DIAGNOSIS — Z9889 Other specified postprocedural states: Secondary | ICD-10-CM | POA: Insufficient documentation

## 2018-04-12 DIAGNOSIS — I481 Persistent atrial fibrillation: Secondary | ICD-10-CM | POA: Diagnosis not present

## 2018-04-12 DIAGNOSIS — Z8711 Personal history of peptic ulcer disease: Secondary | ICD-10-CM | POA: Insufficient documentation

## 2018-04-12 DIAGNOSIS — Z6841 Body Mass Index (BMI) 40.0 and over, adult: Secondary | ICD-10-CM | POA: Diagnosis not present

## 2018-04-12 DIAGNOSIS — Z8546 Personal history of malignant neoplasm of prostate: Secondary | ICD-10-CM | POA: Diagnosis not present

## 2018-04-12 DIAGNOSIS — I4892 Unspecified atrial flutter: Secondary | ICD-10-CM | POA: Diagnosis not present

## 2018-04-12 DIAGNOSIS — Z9884 Bariatric surgery status: Secondary | ICD-10-CM | POA: Insufficient documentation

## 2018-04-12 DIAGNOSIS — Z79899 Other long term (current) drug therapy: Secondary | ICD-10-CM | POA: Insufficient documentation

## 2018-04-12 DIAGNOSIS — I5032 Chronic diastolic (congestive) heart failure: Secondary | ICD-10-CM | POA: Insufficient documentation

## 2018-04-12 DIAGNOSIS — I429 Cardiomyopathy, unspecified: Secondary | ICD-10-CM | POA: Diagnosis not present

## 2018-04-12 DIAGNOSIS — I480 Paroxysmal atrial fibrillation: Secondary | ICD-10-CM | POA: Diagnosis not present

## 2018-04-12 DIAGNOSIS — J45909 Unspecified asthma, uncomplicated: Secondary | ICD-10-CM | POA: Diagnosis not present

## 2018-04-12 DIAGNOSIS — G4733 Obstructive sleep apnea (adult) (pediatric): Secondary | ICD-10-CM | POA: Insufficient documentation

## 2018-04-12 DIAGNOSIS — M1990 Unspecified osteoarthritis, unspecified site: Secondary | ICD-10-CM | POA: Insufficient documentation

## 2018-04-12 DIAGNOSIS — Z7901 Long term (current) use of anticoagulants: Secondary | ICD-10-CM | POA: Diagnosis not present

## 2018-04-12 HISTORY — PX: CARDIOVERSION: SHX1299

## 2018-04-12 SURGERY — CARDIOVERSION
Anesthesia: Monitor Anesthesia Care

## 2018-04-12 MED ORDER — SODIUM CHLORIDE 0.9 % IV SOLN
INTRAVENOUS | Status: DC
  Administered 2018-04-12: 12:00:00 via INTRAVENOUS

## 2018-04-12 MED ORDER — PROPOFOL 10 MG/ML IV BOLUS
INTRAVENOUS | Status: DC | PRN
  Administered 2018-04-12: 60 mg via INTRAVENOUS

## 2018-04-12 MED ORDER — LIDOCAINE HCL (CARDIAC) PF 100 MG/5ML IV SOSY
PREFILLED_SYRINGE | INTRAVENOUS | Status: DC | PRN
  Administered 2018-04-12: 80 mg via INTRAVENOUS

## 2018-04-12 NOTE — CV Procedure (Signed)
    Cardioversion Note  [facility] [id number] 07-02-1941  Procedure: DC Cardioversion Indications: Atrial fib / flutter   Procedure Details Consent: Obtained Time Out: Verified patient identification, verified procedure, site/side was marked, verified correct patient position, special equipment/implants available, Radiology Safety Procedures followed,  medications/allergies/relevent history reviewed, required imaging and test results available.  Performed  The patient has been on adequate anticoagulation.  The patient received IV Lidocaine 80 mg followed by Porpofol 60 mg IV  for sedation.  Synchronous cardioversion was performed at 120  joules.  The cardioversion was successful     Complications: No apparent complications Patient did tolerate procedure well.   [facility], Brooke Bonito., MD, [facility] 04/12/2018, 1:23 PM

## 2018-04-12 NOTE — Anesthesia Preprocedure Evaluation (Addendum)
Anesthesia Evaluation  Patient identified by MRN, date of birth, ID band Patient awake    Reviewed: Allergy & Precautions, NPO status , Patient's Chart, lab work & pertinent test results  Airway Mallampati: II  TM Distance: >3 FB Neck ROM: Full    Dental  (+) Dental Advisory Given, Teeth Intact   Pulmonary asthma , sleep apnea ,    breath sounds clear to auscultation       Cardiovascular hypertension, +CHF  + dysrhythmias Atrial Fibrillation  Rhythm:Irregular Rate:Normal  10/28/2017 Echo Left ventricle: The cavity size was normal. Wall thickness was   increased in a pattern of moderate LVH. Systolic function was   normal. The estimated ejection fraction was in the range of 55%   to 60%. Wall motion was normal; there were no regional wall   motion abnormalities.   Neuro/Psych PSYCHIATRIC DISORDERS negative neurological ROS     GI/Hepatic Neg liver ROS,   Endo/Other  Morbid obesity  Renal/GU negative Renal ROS     Musculoskeletal  (+) Arthritis ,   Abdominal (+) + obese,   Peds  Hematology  (+) anemia ,   Anesthesia Other Findings   Reproductive/Obstetrics                            Anesthesia Physical Anesthesia Plan  ASA: III  Anesthesia Plan: General   Post-op Pain Management:    Induction:   PONV Risk Score and Plan: 2 and Propofol infusion  Airway Management Planned: Natural Airway and Mask  Additional Equipment: None  Intra-op Plan:   Post-operative Plan:   Informed Consent: I have reviewed the patients History and Physical, chart, labs and discussed the procedure including the risks, benefits and alternatives for the proposed anesthesia with the patient or authorized representative who has indicated his/her understanding and acceptance.   Dental advisory given  Plan Discussed with: CRNA and Anesthesiologist  Anesthesia Plan Comments:        Anesthesia Quick  Evaluation

## 2018-04-12 NOTE — H&P (Signed)
Cardiology Office Note  Date: 04/06/2018  ID: Eric Mayer, DOB 1940/10/05, MRN [id number]  PCP: [name], MD  Cardiologist: [name], MD  No chief complaint on file.    History of Present Illness:  Eric Mayer is a 77 y.o. male with HTN, paroxysmal atrial fibrillation, chronic diastolic heart failure, OSA on CPAP, asthma, BPPV, and prostate adenocarcinoma here for follow up. Eric Mayer initially presented 03/2015 with dyspnea on exertion and chronic lower extremity edema. He had a dobutamine stress echo 03/27/15 that was negative for ischemia. He had an echo 06/2015 that revealed LVEF 60-65% with mild aortic regurgitation and mitral regurgitation. They were unable to evaluate diastolic function. He was admitted 07/2015 for acute on chronic diastolic heart failure and diuresed with IV lasix. He also underwent DCCV. Eric Mayer was seen in clinic 04/2016 and reported fatigue, shortness of breath, weight gain, and low blood pressures. At that appointment he was switched from Lasix to Bumex and his valsartan was reduced. His weight is labile and fluctuates with dieteary changes. He has been very responsive to extra doses of Bumex as needed.  Eric Mayer saw [name] 07/21/16 with increased dyspnea on exertion. He was noted to be back in atrial fibrillation in the setting of not wearing his CPAP for the preceding week. He was started on amiodarone 200mg  and metoprolol was reduced to 25mg  bid. He converted back to sinus rhythm and diuresed 10 lb after returning to sinus rhythm. Given that he was on amiodarone he was referred for PFTs 08/20/16 that revealed severe obstruction and likely moderate restriction. There is also moderate diffusion defect. He was referred to pulmonology and saw Dr. Louis Meckel. He underwent bronchoscopy on 09/21/16 which showed no intrathoracic obstruction or tracheal abnormality. He was started on Symbicort. He was also referred to atrial fibrillation for  consideration of alternatives to amiodarone. At this time there was not felt to be any pharmacologic alternatives to amiodarone. He was thought to be a poor candidate for ablation.  Eric Mayer had recurrent atrial fibrillation 09/2017. He missed two doses of Eliquis so he couldn't have DCCV without TEE. Losartan was reduced and metoprolol was increased. He underwent TEE/DCCV on 3/15. [name] has been doing well. He continues to have exertional dyspnea that limits his ability to exercise. He goes to the gym 3 times per week and does water aerobics. Walking to the back of the gym gets very short of breath and he has to stop and rest. He also notes that he is unsteady on his feet. He was supposed to go on a cruise in September but is unable to do so. His lower extremity edema has been stable and he denies orthopnea or PND. His weight has been stable. He has not had any metolazone in the last 3 weeks. When he does take it he gets dehydrated. He notices that he gets very dizzy when standing and has near syncope.  More short of breaht in the last several weeks  BP fluctuates  8/7: metolazone in am. Very short of breath BP 137/81 HR 111. Weak, dizzy, tingling all over. Hot flash. Near syncope. 127/80. HR 112  Afternoon 113/76, HR 70s. Felt better  Weight back up  Very sob with exertion  Near syncope in shower  metopr 25 bid  Bmp  Lexiscan Myoview  F/u 1 month  afib  Bmp, mg  metopr 50 bid      Past Medical History:  Diagnosis Date  .  Anemia 1980s X 1  . Arthritis    "hands, knees, hips, ankles" (07/31/2015)  . Atrial flutter ([place]) 07/31/2015  . Chronic diastolic heart failure ([place])    a. 11/2015: Echo w/ EF of 60-65%, no WMA, Grade 2 DD, trivial AR, ascending aorta mildly dilated.   . Depression   . History of cardiovascular stress test    a. 03/2015: Dobutamine Stress Echo with no evidence of ischemia.   [name] History of gastric bypass   . History of peptic ulcer     1980's  . History of radiation therapy 1993   sarcoma of left groin, tx [facility]  . History of sarcoma    1993 LEFT GOIN-- S/P SURGERY, RADIATION AND CHEMO IN [place]  . Hypertension   . Hypogonadism in male   . Incomplete right bundle branch block   . Nerve injury    SURGICAL NERVE INJURY S/P LEFT GOIN REMOVAL SARCOMA 1992-- RESIDUAL WEAKNESS AND NUMBNESS UPPER LEFT LEG  . Nocturia   . OSA on CPAP   . PAF (paroxysmal atrial fibrillation) ([place])    prior CARDIOLOGIST-- DR Tressia Miners TURNER/ now monitored by dr Jenny Reichmann griffin  . Primary prostate adenocarcinoma ([place]) DX 07/08/14   Gleason 7, stage T1c  . PVC's (premature ventricular contractions) 11/21/2015  . Sarcoma ([place]) ~ 1993/1994   "of groin"  . Weakness of left leg    SECONDARY TO SURGICAL NERVE INJURY OF LEFT GOIN  . Wears glasses         Past Surgical History:  Procedure Laterality Date  . CARDIAC CATHETERIZATION  08-12-2003 dr Tressia Miners turner   Normal coronary arteries, normal wall motion, no sig. abnormalities  . CARDIOVERSION N/A 08/04/2015   Procedure: CARDIOVERSION; Surgeon: [name], MD; Location: [place]; Service: Cardiovascular; Laterality: N/A;  . CARDIOVERSION N/A 10/28/2017   Procedure: CARDIOVERSION; Surgeon: Larey Dresser, MD; Location: [facility] ENDOSCOPY; Service: Cardiovascular; Laterality: N/A;  . COLONOSCOPY  07-03-2002  . [place]  . FRACTURE SURGERY    . GROIN DISSECTION Left 1992 [place]   SARCOMA SURGERY  . INGUINAL HERNIA REPAIR Right 1950s?  . INTESTINAL BYPASS  1976   GASTRIC FOR OBESITY  . JOINT REPLACEMENT    . PATELLA FRACTURE SURGERY Left ~ 1995   "broke it twice; only had OR once"  . PROSTATE BIOPSY  06/2014  . RADIOACTIVE SEED IMPLANT N/A 10/17/2014   Procedure: RADIOACTIVE SEED IMPLANT ; Surgeon: Ailene Rud, MD; Location: [facility]; Service: Urology; Laterality: N/A; 68 SEEDS IMPLANTED   . TEE WITHOUT CARDIOVERSION N/A 10/28/2017    Procedure: TRANSESOPHAGEAL ECHOCARDIOGRAM (TEE); Surgeon: Larey Dresser, MD; Location: [facility] ENDOSCOPY; Service: Cardiovascular; Laterality: N/A;  . TONSILLECTOMY  1950s  . TOTAL KNEE ARTHROPLASTY Right 12/17/2014   Procedure: TOTAL KNEE ARTHROPLASTY; Surgeon: Melrose Nakayama, MD; Location: [place]; Service: Orthopedics; Laterality: Right;  . TRANSTHORACIC ECHOCARDIOGRAM  08-08-2003   moderate LVH/ ef 55-65%/ mild MR/ moderate LAE/ trivial TR/ trivial pericardial effusion posterior to the heart  . [place]  . VIDEO BRONCHOSCOPY Bilateral 09/21/2016   Procedure: VIDEO BRONCHOSCOPY WITHOUT FLUORO; Surgeon: Collene Gobble, MD; Location: [place]; Service: Cardiopulmonary; Laterality: Bilateral;         Current Outpatient Medications  Medication Sig Dispense Refill  . acetaminophen (TYLENOL) 325 MG tablet Take 2 tablets (650 mg total) by mouth every 4 (four) hours as needed for headache or mild pain.    [name] amiodarone (PACERONE) 200 MG tablet  TAKE 1 TABLET BY MOUTH DAILY. (Patient taking differently: TAKE 200 MG BY MOUTH DAILY.) 90 tablet 3  . bumetanide (BUMEX) 2 MG tablet TAKE 1 TABLET BY MOUTH DAILY AND AN EXTRA TABLET FOR WEIGHT GAIN 2 POUNDS DAILY OR 5 POUNDS WEEKLY (Patient taking differently: Take 2 mg by mouth See admin instructions. TAKE 2 MG BY MOUTH DAILY AND AN EXTRA 2 MG FOR WEIGHT GAIN 2 POUNDS DAILY OR 5 POUNDS WEEKLY) 180 tablet 3  . buPROPion (WELLBUTRIN XL) 300 MG 24 hr tablet Take 300 mg by mouth every morning.    . cyanocobalamin (,VITAMIN B-12,) 1000 MCG/ML injection Inject 1,000 mcg into the muscle every 30 (thirty) days.    [name] diltiazem (TIAZAC) 180 MG 24 hr capsule Take 1 capsule (180 mg total) by mouth every morning. 14 capsule 1  . ELIQUIS 5 MG TABS tablet TAKE 1 TABLET BY MOUTH 2 TIMES DAILY. 180 tablet 1  . escitalopram (LEXAPRO) 10 MG tablet Take 10 mg by mouth daily.    [name] losartan (COZAAR) 25 MG tablet Take 1 tablet (25 mg total) by mouth daily. 90 tablet 3   . magnesium oxide (MAG-OX) 400 (241.3 Mg) MG tablet Take 1 tablet (400 mg total) by mouth 2 (two) times daily. 180 tablet 1  . metoprolol tartrate (LOPRESSOR) 25 MG tablet TAKE 1 TABLET BY MOUTH 2 TIMES DAILY. (Patient taking differently: Take 50 mg by mouth 2 (two) times daily. ) 180 tablet 1  . tamsulosin (FLOMAX) 0.4 MG CAPS capsule Take 0.8 mg by mouth at bedtime.      No current facility-administered medications for this visit.    Allergies: Ace inhibitors and Xarelto [rivaroxaban]  Social History: The patient reports that he has never smoked. He has never used smokeless tobacco. He reports that he does not drink alcohol or use drugs.  Family History: The patient's family history includes Atrial fibrillation in his father; Cancer in his sister; Liver disease in his mother; Renal Disease in his mother; Stroke in his mother.  ROS: Please see the history of present illness. Otherwise, review of systems are positive for none. All other systems are reviewed and negative.  PHYSICAL EXAM:  VS: BP 115/62  Pulse (!) 106  Ht 5\' 10"  (1.778 m)  Wt 293 lb 9.6 oz (133.2 kg)  BMI 42.13 kg/m , BMI Body mass index is 42.13 kg/m.  GENERAL: Well appearing  HEENT: Pupils equal round and reactive, fundi not visualized, oral mucosa unremarkable  NECK: No jugular venous distention, waveform within normal limits, carotid upstroke brisk and symmetric, no bruits  LUNGS: Clear to auscultation bilaterally  HEART: RRR. PMI not displaced or sustained,S1 and S2 within normal limits, no S3, no S4, no clicks, no rubs, no murmurs  ABD: Flat, positive bowel sounds normal in frequency in pitch, no bruits, no rebound, no guarding, no midline pulsatile mass, no hepatomegaly, no splenomegaly  EXT: 2 plus pulses throughout, 1+ LE edema bilaterally, no cyanosis no clubbing  SKIN: No rashes no nodules  NEURO: Cranial nerves II through XII grossly intact, motor grossly intact throughout  PSYCH: Cognitively intact, oriented  to person place and time  EKG: EKG is ordered today.  04/22/16: Sinus rhythm rate 67 bpm. RSR' in V1.  05/20/16: Sinus rhythm. Rate 75 bpm. PACs.  06/29/17: Sinus rhythm. Rate 83 bpm. IVCD.  10/25/17: Atrial fibrillation. Rate 115 bpm. LAFB.  12/20/17: Sinus rhythm. Sinus arrhythmia. First-degree AV block. Ventricular rate 68 bpm. LAFB.  04/06/18: Atypical atrial flutter rate 106  bpm.  Echo 12/11/15:  Study Conclusions  - Left ventricle: The cavity size was normal. There was severe  focal basal hypertrophy of the septum. Systolic function was  normal. The estimated ejection fraction was in the range of 60%  to 65%. Wall motion was normal; there were no regional wall  motion abnormalities. Features are consistent with a pseudonormal  left ventricular filling pattern, with concomitant abnormal  relaxation and increased filling pressure (grade 2 diastolic  dysfunction).  - Aortic valve: Trileaflet; mildly thickened, mildly calcified  leaflets. There was trivial regurgitation.  - Aorta: Aortic root dimension: 41 mm (ED).  - Ascending aorta: The ascending aorta was mildly dilated.  - Left atrium: The atrium was moderately dilated.  Anterior-posterior dimension: 55 mm. Volume/bsa, ES, (1-plane  Simpson&'s, A2C): 40.2 ml/m^2.  Echo 07/08/15:  Study Conclusions  - Left ventricle: The cavity size was normal. Wall thickness was increased in a pattern of moderate LVH. Systolic function was normal. The estimated ejection fraction was in the range of 60% to 65%. The study is not technically sufficient to allow evaluation of LV diastolic function. - Aortic valve: Trileaflet. Sclerosis without stenosis. There was mild regurgitation. - Mitral valve: Mildly thickened leaflets . There was mild regurgitation. - Left atrium: Moderately dilated at 43 ml/m2. - Right atrium: The atrium was mildly dilated. - Tricuspid valve: There was mild regurgitation. - Pulmonary arteries: PA peak pressure: 31 mm Hg  (S). - Inferior vena cava: The vessel was normal in size. The respirophasic diameter changes were in the normal range (= 50%), consistent with normal central venous pressure.  Impressions:  - LVEF 60-65%, moderate LVH, normal wall motion, aortic valve sclerosis with mild AI, MR and TR, RVSP 31 mmHg., moderate LAE, mild RAE. Recent Labs:  06/29/2017: ALT 21; TSH 3.800  10/25/2017: Hemoglobin 12.0; Platelets 207  12/30/2017: BUN 54; Creatinine, Ser 0.99; Magnesium 2.3; Potassium 4.0; Sodium 141  Lipid Panel  Labs (Brief)        Wt Readings from Last 3 Encounters:  04/06/18 293 lb 9.6 oz (133.2 kg)  03/08/18 288 lb 9.6 oz (130.9 kg)  01/10/18 292 lb (132.5 kg)   Other studies Reviewed:  Additional studies/ records that were reviewed today include: n/a  ASSESSMENT AND PLAN:  # Atrial fibrillation/flutter:  # PVCs:  Eric Mayer remains in sinus rhythm. Continue amiodarone, diltiazem, metoprolol, and Eliquis.  This patients CHA2DS2-VASc Score and unadjusted Ischemic Stroke Rate (% per year) is equal to 2.2 % stroke rate/year from a score of 2  Above score calculated as 1 point each if present [CHF, HTN, DM, Vascular=MI/PAD/Aortic Plaque, Age if 65-74, or Male] Above score calculated as 2 points each if present [Age > 75, or Stroke/TIA/TE]  # Hypertension: BP well controlled on losartan, metoprolol, and diltiazem. However he has positional dizziness. Reduce losartan to 25mg  and change goal to <140/90.  # Chronic diastolic heart failure: Volume status is stable. He hasn't needed any metolazone lately. Reduce losartan as above. Continue lasix and metoprolol. He will reduce metolazone to 2.5 mg as needed instead of 5 mg.  Current medicines are reviewed at length with the patient today. The patient does not have concerns regarding medicines.  The following changes have been made: reduce losartan to 25 mg daily and metolazone 2.5mg  prn.  Labs/ tests ordered today include: No orders of the  defined types were placed in this encounter.   Disposition: FU with Dr. Oval Linsey in 2 months.  Signed,  [name], MD  04/06/2018 10:14 AM  [facility]    Addendum:   Pt presents for cardioversion  Has been taking his Eliquis without missing any doses.  We discussed risks, benefits of cardioversion  He understands and agrees to proceed.     Mertie Moores, MD  04/12/2018 1:29 PM  [place]  [address], [place]  [place], [place] [address]  Pager [phone]  Phone: [phone]; Fax: [phone]

## 2018-04-12 NOTE — Anesthesia Postprocedure Evaluation (Signed)
Anesthesia Post Note  Patient: Eric Mayer  Procedure(s) Performed: CARDIOVERSION (N/A )     Patient location during evaluation: Endoscopy Anesthesia Type: General Level of consciousness: awake Vital Signs Assessment: post-procedure vital signs reviewed and stable Respiratory status: spontaneous breathing Cardiovascular status: stable Anesthetic complications: no    Last Vitals:  Vitals:   04/12/18 1152 04/12/18 1327  BP: 115/66 (!) 87/43  Pulse:  65  Resp: 15 16  Temp: 36.6 C   SpO2:  96%    Last Pain:  Vitals:   04/12/18 1327  TempSrc:   PainSc: 0-No pain                 Keidy Thurgood

## 2018-04-12 NOTE — Transfer of Care (Signed)
Immediate Anesthesia Transfer of Care Note  Patient: Eric Mayer  Procedure(s) Performed: CARDIOVERSION (N/A )  Patient Location: PACU and Endoscopy Unit  Anesthesia Type:General  Level of Consciousness: awake, alert  and oriented  Airway & Oxygen Therapy: Patient Spontanous Breathing  Post-op Assessment: Report given to RN and Post -op Vital signs reviewed and stable  Post vital signs: Reviewed and stable  Last Vitals:  Vitals Value Taken Time  BP    Temp    Pulse 70 04/12/2018  1:25 PM  Resp 27 04/12/2018  1:25 PM  SpO2 93 % 04/12/2018  1:25 PM  Vitals shown include unvalidated device data.  Last Pain:  Vitals:   04/12/18 1152  TempSrc: Oral  PainSc: 0-No pain         Complications: No apparent anesthesia complications

## 2018-04-13 ENCOUNTER — Encounter (HOSPITAL_COMMUNITY): Payer: Self-pay | Admitting: Cardiovascular Disease

## 2018-04-13 ENCOUNTER — Ambulatory Visit: Payer: Medicare Other | Admitting: Cardiovascular Disease

## 2018-04-14 ENCOUNTER — Ambulatory Visit (HOSPITAL_COMMUNITY): Payer: Medicare Other | Admitting: Nurse Practitioner

## 2018-04-18 ENCOUNTER — Encounter: Payer: Self-pay | Admitting: Cardiovascular Disease

## 2018-04-18 ENCOUNTER — Telehealth (HOSPITAL_COMMUNITY): Payer: Self-pay

## 2018-04-18 NOTE — Telephone Encounter (Signed)
Encounter complete. 

## 2018-04-19 ENCOUNTER — Ambulatory Visit (HOSPITAL_COMMUNITY)
Admission: RE | Admit: 2018-04-19 | Discharge: 2018-04-19 | Disposition: A | Discharge status: Home / Self Care | Payer: Medicare Other | Source: Ambulatory Visit | Attending: Nurse Practitioner | Admitting: Nurse Practitioner

## 2018-04-19 ENCOUNTER — Encounter (HOSPITAL_COMMUNITY): Payer: Self-pay | Admitting: Nurse Practitioner

## 2018-04-19 ENCOUNTER — Other Ambulatory Visit: Payer: Self-pay

## 2018-04-19 VITALS — BP 122/62 | HR 65 | Ht 70.0 in | Wt 292.0 lb

## 2018-04-19 DIAGNOSIS — G4733 Obstructive sleep apnea (adult) (pediatric): Secondary | ICD-10-CM | POA: Insufficient documentation

## 2018-04-19 DIAGNOSIS — Z7901 Long term (current) use of anticoagulants: Secondary | ICD-10-CM | POA: Diagnosis not present

## 2018-04-19 DIAGNOSIS — I4892 Unspecified atrial flutter: Secondary | ICD-10-CM | POA: Insufficient documentation

## 2018-04-19 DIAGNOSIS — Z79899 Other long term (current) drug therapy: Secondary | ICD-10-CM | POA: Diagnosis not present

## 2018-04-19 DIAGNOSIS — Z8546 Personal history of malignant neoplasm of prostate: Secondary | ICD-10-CM | POA: Insufficient documentation

## 2018-04-19 DIAGNOSIS — Z923 Personal history of irradiation: Secondary | ICD-10-CM | POA: Insufficient documentation

## 2018-04-19 DIAGNOSIS — Z96651 Presence of right artificial knee joint: Secondary | ICD-10-CM | POA: Insufficient documentation

## 2018-04-19 DIAGNOSIS — I4819 Other persistent atrial fibrillation: Secondary | ICD-10-CM

## 2018-04-19 DIAGNOSIS — I480 Paroxysmal atrial fibrillation: Secondary | ICD-10-CM | POA: Insufficient documentation

## 2018-04-19 DIAGNOSIS — I481 Persistent atrial fibrillation: Secondary | ICD-10-CM

## 2018-04-19 DIAGNOSIS — Z9889 Other specified postprocedural states: Secondary | ICD-10-CM | POA: Insufficient documentation

## 2018-04-19 DIAGNOSIS — I5032 Chronic diastolic (congestive) heart failure: Secondary | ICD-10-CM | POA: Insufficient documentation

## 2018-04-19 DIAGNOSIS — I110 Hypertensive heart disease with heart failure: Secondary | ICD-10-CM | POA: Insufficient documentation

## 2018-04-19 NOTE — Progress Notes (Signed)
Primary Care Physician: [name], MD Referring Physician: Dr. Lamount Cranker is a 77 y.o. male with a h/o obesity, afib/flutter, diastolic heart failure, OSA on cpap, HTN, that has been on amiodarone for years but with two breakthrough afib episodes this year requiring cardioversion. His last successful cardioversion was 8/22. He was sent here to discuss different options to maintain SR. He does not use alcohol, tobacco or excessive caffeine. He had been doing water aerobics until he developed this last episode of afib and then he did not have the energy to do the class. He has lost and gained the same weight back over the years.   Today, he denies symptoms of palpitations, chest pain, shortness of breath, orthopnea, PND, lower extremity edema, dizziness, presyncope, syncope, or neurologic sequela. The patient is tolerating medications without difficulties and is otherwise without complaint today.   Past Medical History:  Diagnosis Date  . Anemia 1980s X 1  . Arthritis    "hands, knees, hips, ankles" (07/31/2015)  . Atrial flutter ([place]) 07/31/2015  . Chronic diastolic heart failure ([place])    a. 11/2015: Echo w/ EF of 60-65%, no WMA, Grade 2 DD, trivial AR, ascending aorta mildly dilated.   . Depression   . History of cardiovascular stress test    a. 03/2015: Dobutamine Stress Echo with no evidence of ischemia.   [name] History of gastric bypass   . History of peptic ulcer    1980's  . History of radiation therapy 1993   sarcoma of left groin, tx at [facility]  . History of sarcoma    1993  LEFT GOIN--  S/P SURGERY, RADIATION AND CHEMO IN [place]  . Hypertension   . Hypogonadism in male   . Incomplete right bundle branch block   . 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 Nocturia   . OSA on CPAP   . PAF (paroxysmal atrial fibrillation) ([place])    prior CARDIOLOGIST-- DR Tressia Miners TURNER/    now monitored by dr Jenny Reichmann griffin  . Primary prostate adenocarcinoma ([place]) DX 07/08/14   Gleason 7,  stage T1c  . PVC's (premature ventricular contractions) 11/21/2015  . Sarcoma ([place]) ~ 1993/1994   "of groin"  . Weakness of left leg    SECONDARY TO SURGICAL NERVE INJURY OF LEFT GOIN  . Wears glasses    Past Surgical History:  Procedure Laterality Date  . CARDIAC CATHETERIZATION  08-12-2003  dr Tressia Miners turner   Normal coronary arteries, normal wall motion, no sig. abnormalities  . CARDIOVERSION N/A 08/04/2015   Procedure: CARDIOVERSION;  Surgeon: [name], MD;  Location: [place];  Service: Cardiovascular;  Laterality: N/A;  . CARDIOVERSION N/A 10/28/2017   Procedure: CARDIOVERSION;  Surgeon: Larey Dresser, MD;  Location: [facility] ENDOSCOPY;  Service: Cardiovascular;  Laterality: N/A;  . CARDIOVERSION N/A 04/12/2018   Procedure: CARDIOVERSION;  Surgeon: Thayer Headings, MD;  Location: [facility] ENDOSCOPY;  Service: Cardiovascular;  Laterality: N/A;  . COLONOSCOPY  07-03-2002  . [place]  . FRACTURE SURGERY    . GROIN DISSECTION Left 1992   [place]   SARCOMA SURGERY  . INGUINAL HERNIA REPAIR Right 1950s?  . INTESTINAL BYPASS  1976   GASTRIC FOR OBESITY  . JOINT REPLACEMENT    . PATELLA FRACTURE SURGERY Left ~ 1995   "broke it twice; only had OR once"  .  PROSTATE BIOPSY  06/2014  . RADIOACTIVE SEED IMPLANT N/A 10/17/2014   Procedure: RADIOACTIVE SEED IMPLANT    ;  Surgeon: Ailene Rud, MD;  Location[facility];  Service: Urology;  Laterality: N/A;   68 SEEDS IMPLANTED   . TEE WITHOUT CARDIOVERSION N/A 10/28/2017   Procedure: TRANSESOPHAGEAL ECHOCARDIOGRAM (TEE);  Surgeon: Larey Dresser, MD;  Location: [facility] ENDOSCOPY;  Service: Cardiovascular;  Laterality: N/A;  . TONSILLECTOMY  1950s  . TOTAL KNEE ARTHROPLASTY Right 12/17/2014   Procedure: TOTAL KNEE ARTHROPLASTY;  Surgeon: Melrose Nakayama, MD;  Location: [place];  Service: Orthopedics;   Laterality: Right;  . TRANSTHORACIC ECHOCARDIOGRAM  08-08-2003   moderate LVH/  ef 55-65%/  mild MR/  moderate LAE/  trivial TR/  trivial pericardial effusion posterior to the heart  . [place]  . VIDEO BRONCHOSCOPY Bilateral 09/21/2016   Procedure: VIDEO BRONCHOSCOPY WITHOUT FLUORO;  Surgeon: Collene Gobble, MD;  Location: [place];  Service: Cardiopulmonary;  Laterality: Bilateral;    Current Outpatient Medications  Medication Sig Dispense Refill  . acetaminophen (TYLENOL) 325 MG tablet Take 2 tablets (650 mg total) by mouth every 4 (four) hours as needed for headache or mild pain.    [name] albuterol (PROVENTIL HFA;VENTOLIN HFA) 108 (90 Base) MCG/ACT inhaler Inhale 2 puffs into the lungs every 6 (six) hours as needed for wheezing or shortness of breath.    [name] amiodarone (PACERONE) 200 MG tablet TAKE 1 TABLET BY MOUTH DAILY. (Patient taking differently: Take 200 mg by mouth daily. ) 90 tablet 3  . bumetanide (BUMEX) 2 MG tablet TAKE 1 TABLET BY MOUTH DAILY AND AN EXTRA TABLET FOR WEIGHT GAIN 2 POUNDS DAILY OR 5 POUNDS WEEKLY (Patient taking differently: Take 2 mg by mouth See admin instructions. TAKE 2 MG BY MOUTH DAILY AND AN EXTRA 2 MG FOR WEIGHT GAIN 2 POUNDS DAILY OR 5 POUNDS WEEKLY) 180 tablet 3  . buPROPion (WELLBUTRIN XL) 300 MG 24 hr tablet Take 300 mg by mouth every morning.    . cyanocobalamin (,VITAMIN B-12,) 1000 MCG/ML injection Inject 1,000 mcg into the muscle every 30 (thirty) days.    [name] diltiazem (TIAZAC) 180 MG 24 hr capsule Take 1 capsule (180 mg total) by mouth every morning. 14 capsule 1  . ELIQUIS 5 MG TABS tablet TAKE 1 TABLET BY MOUTH 2 TIMES DAILY. (Patient taking differently: Take 5 mg by mouth 2 (two) times daily. ) 180 tablet 1  . escitalopram (LEXAPRO) 10 MG tablet Take 10 mg by mouth daily.    [name] losartan (COZAAR) 25 MG tablet Take 1 tablet (25 mg total) by mouth daily. 90 tablet 3  . magnesium oxide (MAG-OX) 400 (241.3 Mg) MG tablet 2 tablets in the  morning and 1 tablet in the evening 270 tablet 3  . metoprolol tartrate (LOPRESSOR) 50 MG tablet Take 1 tablet (50 mg total) by mouth 2 (two) times daily. 180 tablet 3  . Polyethyl Glycol-Propyl Glycol (SYSTANE ULTRA) 0.4-0.3 % SOLN Place 1 drop into both eyes daily as needed (for dry eyes).    . tamsulosin (FLOMAX) 0.4 MG CAPS capsule Take 0.8 mg by mouth at bedtime.      Current Facility-Administered Medications  Medication Dose Route Frequency Provider Last Rate Last Dose  . 0.9 %  sodium chloride infusion  250 mL Intravenous Continuous [name], MD      . sodium chloride flush (NS) 0.9 % injection 3 mL  3 mL Intravenous Q12H Oval Linsey,  Tiffany, MD      . sodium chloride flush (NS) 0.9 % injection 3 mL  3 mL Intravenous PRN [name], MD        Allergies  Allergen Reactions  . Ace Inhibitors Cough  . Xarelto [Rivaroxaban] Other (See Comments)    Joint pain    Social History   Socioeconomic History  . Marital status: Married    Spouse name: Not on file  . Number of children: Not on file  . Years of education: Not on file  . Highest education level: Not on file  Occupational History  . Not on file  Social Needs  . Financial resource strain: Not on file  . Food insecurity:    Worry: Not on file    Inability: Not on file  . Transportation needs:    Medical: Not on file    Non-medical: Not on file  Tobacco Use  . Smoking status: Never Smoker  . Smokeless tobacco: Never Used  Substance and Sexual Activity  . Alcohol use: No  . Drug use: No  . Sexual activity: Not Currently  Lifestyle  . Physical activity:    Days per week: Not on file    Minutes per session: Not on file  . Stress: Not on file  Relationships  . Social connections:    Talks on phone: Not on file    Gets together: Not on file    Attends religious service: Not on file    Active member of club or organization: Not on file    Attends meetings of clubs or organizations: Not on file     Relationship status: Not on file  . Intimate partner violence:    Fear of current or ex partner: Not on file    Emotionally abused: Not on file    Physically abused: Not on file    Forced sexual activity: Not on file  Other Topics Concern  . Not on file  Social History Narrative  . Not on file    Family History  Problem Relation Age of Onset  . Liver disease Mother   . Renal Disease Mother   . Stroke Mother   . Atrial fibrillation Father   . Cancer Sister     ROS- All systems are reviewed and negative except as per the HPI above  Physical Exam: Vitals:   04/19/18 1131  BP: 122/62  Pulse: 65  Weight: 132.5 kg  Height: 5\' 10"  (1.778 m)   Wt Readings from Last 3 Encounters:  04/19/18 132.5 kg  04/12/18 133 kg  04/06/18 133.2 kg    Labs: Lab Results  Component Value Date   NA 144 04/06/2018   K 4.1 04/06/2018   CL 104 04/06/2018   CO2 25 04/06/2018   GLUCOSE 87 04/06/2018   BUN 25 04/06/2018   CREATININE 0.95 04/06/2018   CALCIUM 9.0 04/06/2018   MG 1.5 (L) 04/06/2018   Lab Results  Component Value Date   INR 1.32 08/03/2015   No results found for: CHOL, HDL, LDLCALC, TRIG   GEN- The patient is well appearing, alert and oriented x 3 today.   Head- normocephalic, atraumatic Eyes-  Sclera clear, conjunctiva pink Ears- hearing intact Oropharynx- clear Neck- supple, no JVP Lymph- no cervical lymphadenopathy Lungs- Clear to ausculation bilaterally, normal work of breathing Heart- Regular rate and rhythm, no murmurs, rubs or gallops, PMI not laterally displaced GI- soft, NT, ND, + BS Extremities- no clubbing, cyanosis, or edema MS- no significant deformity  or atrophy Skin- no rash or lesion Psych- euthymic mood, full affect Neuro- strength and sensation are intact  EKG-NSR at 65 bpm, pr int 156 ms, qrs int 108 ms, qtc 463 ms Study Conclusions-TEE- 10/28/17  - Left ventricle: The cavity size was normal. Wall thickness was   increased in a pattern of  moderate LVH. Systolic function was   normal. The estimated ejection fraction was in the range of 55%   to 60%. Wall motion was normal; there were no regional wall   motion abnormalities. - Aortic valve: There was no stenosis. There was mild   regurgitation. - Aorta: Normal caliber thoracic aorta with mild plaque. - Mitral valve: There was mild to moderate regurgitation. - Left atrium: The atrium was moderately dilated. No evidence of   thrombus in the atrial cavity or appendage. No evidence of   thrombus in the atrial cavity or appendage. - Right ventricle: The cavity size was mildly dilated. Systolic   function was mildly reduced. - Right atrium: The atrium was mildly dilated. - Atrial septum: No PFO/ASD by color doppler.  Impressions:  - Successful cardioversion. No cardiac source of emboli was   indentified.     Assessment and Plan: 1. Paroxysmal afib /flutter Pt has required 2 cardioversion's this year for breakthrough on amiodarone, which pt has been on for years He was referred here to discuss other  options to control afib  Tikosyn may be an option but he would have to washout amiodarone which may take months and he may have symptomatic afib waiting for the washout He may also be an ablation candidate but worry his weight may be an issue Pt states that he will aggressively work on weight loss and encouraged to get back to water aerobics I will refer to Dr. Rayann Heman to further discuss if he is an ablation candidate Continue eliquis 5 mg bid for a chadsvasc score of at least 3  Consult with Dr. Rayann Heman requested  Geroge Baseman. Moniqua Engebretsen, [facility] [address] [place], [place] [address] [phone]

## 2018-04-20 ENCOUNTER — Ambulatory Visit (HOSPITAL_COMMUNITY)
Admission: RE | Admit: 2018-04-20 | Discharge: 2018-04-20 | Disposition: A | Discharge status: Home / Self Care | Payer: Medicare Other | Source: Ambulatory Visit | Attending: Cardiovascular Disease | Admitting: Cardiovascular Disease

## 2018-04-20 DIAGNOSIS — R5383 Other fatigue: Secondary | ICD-10-CM

## 2018-04-20 DIAGNOSIS — I4892 Unspecified atrial flutter: Secondary | ICD-10-CM

## 2018-04-20 DIAGNOSIS — R0602 Shortness of breath: Secondary | ICD-10-CM | POA: Diagnosis not present

## 2018-04-20 MED ORDER — REGADENOSON 0.4 MG/5ML IV SOLN
0.4000 mg | Freq: Once | INTRAVENOUS | Status: AC
  Administered 2018-04-20: 0.4 mg via INTRAVENOUS

## 2018-04-20 MED ORDER — TECHNETIUM TC 99M TETROFOSMIN IV KIT
26.5000 | PACK | Freq: Once | INTRAVENOUS | Status: AC | PRN
  Administered 2018-04-20: 26.5 via INTRAVENOUS
  Filled 2018-04-20: qty 27

## 2018-04-21 ENCOUNTER — Ambulatory Visit (HOSPITAL_COMMUNITY)
Admission: RE | Admit: 2018-04-21 | Discharge: 2018-04-21 | Disposition: A | Discharge status: Home / Self Care | Payer: Medicare Other | Source: Ambulatory Visit | Attending: Cardiovascular Disease | Admitting: Cardiovascular Disease

## 2018-04-21 LAB — MYOCARDIAL PERFUSION IMAGING
LV dias vol: 126 mL (ref 62–150)
LV sys vol: 50 mL
Peak HR: 90 {beats}/min
Rest HR: 70 {beats}/min
SDS: 3
SRS: 4
SSS: 7
TID: 0.77

## 2018-04-21 MED ORDER — TECHNETIUM TC 99M TETROFOSMIN IV KIT
25.1000 | PACK | Freq: Once | INTRAVENOUS | Status: AC | PRN
  Administered 2018-04-21: 25.1 via INTRAVENOUS

## 2018-05-09 ENCOUNTER — Encounter: Payer: Self-pay | Admitting: Cardiovascular Disease

## 2018-05-09 ENCOUNTER — Ambulatory Visit (INDEPENDENT_AMBULATORY_CARE_PROVIDER_SITE_OTHER): Payer: Medicare Other | Admitting: Cardiovascular Disease

## 2018-05-09 VITALS — BP 116/62 | HR 81 | Ht 68.0 in | Wt 281.4 lb

## 2018-05-09 DIAGNOSIS — I5032 Chronic diastolic (congestive) heart failure: Secondary | ICD-10-CM | POA: Diagnosis not present

## 2018-05-09 DIAGNOSIS — I10 Essential (primary) hypertension: Secondary | ICD-10-CM | POA: Diagnosis not present

## 2018-05-09 DIAGNOSIS — Z5181 Encounter for therapeutic drug level monitoring: Secondary | ICD-10-CM | POA: Diagnosis not present

## 2018-05-09 DIAGNOSIS — I480 Paroxysmal atrial fibrillation: Secondary | ICD-10-CM

## 2018-05-09 DIAGNOSIS — I5033 Acute on chronic diastolic (congestive) heart failure: Secondary | ICD-10-CM

## 2018-05-09 LAB — COMPREHENSIVE METABOLIC PANEL
ALT: 60 IU/L — ABNORMAL HIGH (ref 0–44)
AST: 23 IU/L (ref 0–40)
Albumin/Globulin Ratio: 2.1 (ref 1.2–2.2)
Albumin: 4 g/dL (ref 3.5–4.8)
Alkaline Phosphatase: 145 IU/L — ABNORMAL HIGH (ref 39–117)
BUN/Creatinine Ratio: 28 — ABNORMAL HIGH (ref 10–24)
BUN: 34 mg/dL — ABNORMAL HIGH (ref 8–27)
Bilirubin Total: 0.9 mg/dL (ref 0.0–1.2)
CO2: 23 mmol/L (ref 20–29)
Calcium: 9.1 mg/dL (ref 8.6–10.2)
Chloride: 102 mmol/L (ref 96–106)
Creatinine, Ser: 1.23 mg/dL (ref 0.76–1.27)
GFR calc Af Amer: 66 mL/min/{1.73_m2} (ref >59)
GFR calc non Af Amer: 57 mL/min/{1.73_m2} — ABNORMAL LOW (ref >59)
Globulin, Total: 1.9 g/dL (ref 1.5–4.5)
Glucose: 101 mg/dL — ABNORMAL HIGH (ref 65–99)
Potassium: 3.4 mmol/L — ABNORMAL LOW (ref 3.5–5.2)
Sodium: 144 mmol/L (ref 134–144)
Total Protein: 5.9 g/dL — ABNORMAL LOW (ref 6.0–8.5)

## 2018-05-09 LAB — LIPID PANEL
Chol/HDL Ratio: 3.6 ratio (ref 0.0–5.0)
Cholesterol, Total: 167 mg/dL (ref 100–199)
HDL: 46 mg/dL (ref >39)
LDL Calculated: 88 mg/dL (ref 0–99)
Triglycerides: 164 mg/dL — ABNORMAL HIGH (ref 0–149)
VLDL Cholesterol Cal: 33 mg/dL (ref 5–40)

## 2018-05-09 LAB — MAGNESIUM: Magnesium: 1.5 mg/dL — ABNORMAL LOW (ref 1.6–2.3)

## 2018-05-09 MED ORDER — AMIODARONE HCL 200 MG PO TABS: 200.0000 mg | ORAL_TABLET | Freq: Two times a day (BID) | ORAL | 3 refills | Status: DC

## 2018-05-09 NOTE — Progress Notes (Signed)
Cardiology Office Note   Date:  05/09/2018   ID:  Eric Mayer, DOB 21-Nov-1940, MRN [id number]  PCP:  [name], MD  Cardiologist:   [name], MD   No chief complaint on file.    History of Present Illness: Eric Mayer is a 77 y.o. male with HTN, paroxysmal atrial fibrillation/flutter, chronic diastolic heart failure, OSA on CPAP, asthma, BPPV, and prostate adenocarcinoma here for follow up.  Eric Mayer initially presented 03/2015 with dyspnea on exertion and chronic lower extremity edema.  He had a dobutamine stress echo 03/27/15 that was negative for ischemia.  He had an echo 06/2015 that revealed LVEF 60-65% with mild aortic regurgitation and mitral regurgitation.  They were unable to evaluate diastolic function.  He was admitted 07/2015 for acute on chronic diastolic heart failure and diuresed with IV lasix. He also underwent DCCV.  Eric Mayer was seen in clinic 04/2016 and reported fatigue, shortness of breath, weight gain, and low blood pressures.  At that appointment he was switched from Lasix to Bumex and his valsartan was reduced. His weight is labile and fluctuates with dieteary changes.  He has been very responsive to extra doses of Bumex as needed.  Eric Mayer saw [name] 07/21/16 with increased dyspnea on exertion.  He was noted to be back in atrial fibrillation in the setting of not wearing his CPAP for the preceding week.  He was started on amiodarone 200mg  and metoprolol was reduced to 25mg  bid.  He converted back to sinus rhythm and diuresed 10 lb after returning to sinus rhythm.  Given that he was on amiodarone he was referred for PFTs 08/20/16 that revealed severe obstruction and likely moderate restriction.  There is also moderate diffusion defect.  He was referred to pulmonology and saw Dr. Louis Meckel. He underwent bronchoscopy on 09/21/16 which showed no intrathoracic obstruction or tracheal abnormality. He was started on Symbicort.  He was also  referred to atrial fibrillation for consideration of alternatives to amiodarone. At this time there was not felt to be any pharmacologic alternatives to amiodarone. He was thought to be a poor candidate for ablation.    Eric Mayer had recurrent atrial fibrillation 09/2017.  He missed two doses of Eliquis so he couldn't have DCCV without TEE.  Losartan was reduced and metoprolol was increased.  He underwent TEE/DCCV on 3/15.   At his last appointment Eric Mayer was back in atrial flutter.  He also reported some chest discomfort.  He was referred for cardioversion n 04/12/2018 and was successfully converted with 1 shock.  He followed up in the atrial fibrillation clinic 04/19/2018.  Options of washing on amiodarone and switching to dofetilide and ablation were discussed.  However, there is concern about ablation given his weight.  Eric Mayer decided to work on weight loss and plans to follow-up with Dr. Rayann Heman to see if he may be a candidate for ablation.  Eric Mayer started feeling poorly again on 05/01/18.  He has been more short of breath with minimal exertion and has noted fluttering in his chest.  His home monitor told him that he was in atrial fibrillation with rates in the 70s.  He feels as though he has been in and out of atrial fibrillation since last Sunday.  He started to have increased lower extremity edema so he took a dose of metolazone.  His weight before taking it was 288.  Today it was 277.   He had a [employer] 04/2018  that was negative for ischemia.  LVEF was 60%.   Past Medical History:  Diagnosis Date  . Anemia 1980s X 1  . Arthritis    "hands, knees, hips, ankles" (07/31/2015)  . Atrial flutter ([place]) 07/31/2015  . Chronic diastolic heart failure ([place])    a. 11/2015: Echo w/ EF of 60-65%, no WMA, Grade 2 DD, trivial AR, ascending aorta mildly dilated.   . Depression   . History of cardiovascular stress test    a. 03/2015: Dobutamine Stress Echo with no evidence of ischemia.   [name]  History of gastric bypass   . History of peptic ulcer    1980's  . History of radiation therapy 1993   sarcoma of left groin, tx at [facility]  . History of sarcoma    1993  LEFT GOIN--  S/P SURGERY, RADIATION AND CHEMO IN [place]  . Hypertension   . Hypogonadism in male   . Incomplete right bundle branch block   . 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 Nocturia   . OSA on CPAP   . PAF (paroxysmal atrial fibrillation) ([place])    prior CARDIOLOGIST-- DR Tressia Miners TURNER/   now monitored by dr Jenny Reichmann griffin  . Primary prostate adenocarcinoma ([place]) DX 07/08/14   Gleason 7,  stage T1c  . PVC's (premature ventricular contractions) 11/21/2015  . Sarcoma ([place]) ~ 1993/1994   "of groin"  . Weakness of left leg    SECONDARY TO SURGICAL NERVE INJURY OF LEFT GOIN  . Wears glasses     Past Surgical History:  Procedure Laterality Date  . CARDIAC CATHETERIZATION  08-12-2003  dr Tressia Miners turner   Normal coronary arteries, normal wall motion, no sig. abnormalities  . CARDIOVERSION N/A 08/04/2015   Procedure: CARDIOVERSION;  Surgeon: [name], MD;  Location: [place];  Service: Cardiovascular;  Laterality: N/A;  . CARDIOVERSION N/A 10/28/2017   Procedure: CARDIOVERSION;  Surgeon: Larey Dresser, MD;  Location[facility] ENDOSCOPY;  Service: Cardiovascular;  Laterality: N/A;  . CARDIOVERSION N/A 04/12/2018   Procedure: CARDIOVERSION;  Surgeon: Thayer Headings, MD;  Location: [facility] ENDOSCOPY;  Service: Cardiovascular;  Laterality: N/A;  . COLONOSCOPY  07-03-2002  . [place]  . FRACTURE SURGERY    . GROIN DISSECTION Left 1992   [place]   SARCOMA SURGERY  . INGUINAL HERNIA REPAIR Right 1950s?  . INTESTINAL BYPASS  1976   GASTRIC FOR OBESITY  . JOINT REPLACEMENT    . PATELLA FRACTURE SURGERY Left ~ 1995   "broke it twice; only had OR once"  . PROSTATE BIOPSY  06/2014  . RADIOACTIVE SEED  IMPLANT N/A 10/17/2014   Procedure: RADIOACTIVE SEED IMPLANT    ;  Surgeon: Ailene Rud, MD;  Location: [facility];  Service: Urology;  Laterality: N/A;   68 SEEDS IMPLANTED   . TEE WITHOUT CARDIOVERSION N/A 10/28/2017   Procedure: TRANSESOPHAGEAL ECHOCARDIOGRAM (TEE);  Surgeon: Larey Dresser, MD;  Location: [facility] ENDOSCOPY;  Service: Cardiovascular;  Laterality: N/A;  . TONSILLECTOMY  1950s  . TOTAL KNEE ARTHROPLASTY Right 12/17/2014   Procedure: TOTAL KNEE ARTHROPLASTY;  Surgeon: Melrose Nakayama, MD;  Location: [place];  Service: Orthopedics;  Laterality: Right;  . TRANSTHORACIC ECHOCARDIOGRAM  08-08-2003   moderate LVH/  ef 55-65%/  mild MR/  moderate LAE/  trivial TR/  trivial pericardial effusion posterior to the heart  . VENTRAL  HERNIA REPAIR  1977  . VIDEO BRONCHOSCOPY Bilateral 09/21/2016   Procedure: VIDEO BRONCHOSCOPY WITHOUT FLUORO;  Surgeon: Collene Gobble, MD;  Location: [place];  Service: Cardiopulmonary;  Laterality: Bilateral;     Current Outpatient Medications  Medication Sig Dispense Refill  . acetaminophen (TYLENOL) 325 MG tablet Take 2 tablets (650 mg total) by mouth every 4 (four) hours as needed for headache or mild pain.    [name] albuterol (PROVENTIL HFA;VENTOLIN HFA) 108 (90 Base) MCG/ACT inhaler Inhale 2 puffs into the lungs every 6 (six) hours as needed for wheezing or shortness of breath.    [name] amiodarone (PACERONE) 200 MG tablet Take 1 tablet (200 mg total) by mouth 2 (two) times daily. 180 tablet 3  . bumetanide (BUMEX) 2 MG tablet TAKE 1 TABLET BY MOUTH DAILY AND AN EXTRA TABLET FOR WEIGHT GAIN 2 POUNDS DAILY OR 5 POUNDS WEEKLY (Patient taking differently: Take 2 mg by mouth See admin instructions. TAKE 2 MG BY MOUTH DAILY AND AN EXTRA 2 MG FOR WEIGHT GAIN 2 POUNDS DAILY OR 5 POUNDS WEEKLY) 180 tablet 3  . buPROPion (WELLBUTRIN XL) 300 MG 24 hr tablet Take 300 mg by mouth every morning.    . cyanocobalamin (,VITAMIN B-12,) 1000 MCG/ML injection  Inject 1,000 mcg into the muscle every 30 (thirty) days.    [name] diltiazem (TIAZAC) 180 MG 24 hr capsule Take 1 capsule (180 mg total) by mouth every morning. 14 capsule 1  . ELIQUIS 5 MG TABS tablet TAKE 1 TABLET BY MOUTH 2 TIMES DAILY. (Patient taking differently: Take 5 mg by mouth 2 (two) times daily. ) 180 tablet 1  . escitalopram (LEXAPRO) 10 MG tablet Take 10 mg by mouth daily.    [name] losartan (COZAAR) 25 MG tablet Take 1 tablet (25 mg total) by mouth daily. 90 tablet 3  . magnesium oxide (MAG-OX) 400 (241.3 Mg) MG tablet 2 tablets in the morning and 1 tablet in the evening 270 tablet 3  . metoprolol tartrate (LOPRESSOR) 50 MG tablet Take 1 tablet (50 mg total) by mouth 2 (two) times daily. 180 tablet 3  . Polyethyl Glycol-Propyl Glycol (SYSTANE ULTRA) 0.4-0.3 % SOLN Place 1 drop into both eyes daily as needed (for dry eyes).    . tamsulosin (FLOMAX) 0.4 MG CAPS capsule Take 0.8 mg by mouth at bedtime.      Current Facility-Administered Medications  Medication Dose Route Frequency Provider Last Rate Last Dose  . 0.9 %  sodium chloride infusion  250 mL Intravenous Continuous [name], MD      . sodium chloride flush (NS) 0.9 % injection 3 mL  3 mL Intravenous Q12H [name], MD      . sodium chloride flush (NS) 0.9 % injection 3 mL  3 mL Intravenous PRN [name], MD        Allergies:   Ace inhibitors and Xarelto [rivaroxaban]    Social History:  The patient  reports that he has never smoked. He has never used smokeless tobacco. He reports that he does not drink alcohol or use drugs.   Family History:  The patient's family history includes Atrial fibrillation in his father; Cancer in his sister; Liver disease in his mother; Renal Disease in his mother; Stroke in his mother.    ROS:  Please see the history of present illness.   Otherwise, review of systems are positive for none.   All other systems are reviewed and negative.    PHYSICAL EXAM: VS:  BP  116/62    Pulse 81   Ht 5\' 8"  (1.727 m)   Wt 281 lb 6.4 oz (127.6 kg)   BMI 42.79 kg/m  , BMI Body mass index is 42.79 kg/m. GENERAL:  Well appearing HEENT: Pupils equal round and reactive, fundi not visualized, oral mucosa unremarkable NECK:  No jugular venous distention, waveform within normal limits, carotid upstroke brisk and symmetric, no bruits, no thyromegaly LYMPHATICS:  No cervical adenopathy LUNGS:  Clear to auscultation bilaterally HEART:  Irregularly irregular.  PMI not displaced or sustained,S1 and S2 within normal limits, no S3, no S4, no clicks, no rubs, no murmurs ABD:  Flat, positive bowel sounds normal in frequency in pitch, no bruits, no rebound, no guarding, no midline pulsatile mass, no hepatomegaly, no splenomegaly EXT:  2 plus pulses throughout, no edema, no cyanosis no clubbing SKIN:  No rashes no nodules NEURO:  Cranial nerves II through XII grossly intact, motor grossly intact throughout PSYCH:  Cognitively intact, oriented to person place and time   EKG:  EKG is not ordered today.   04/22/16: Sinus rhythm rate 67 bpm.  RSR' in V1. 05/20/16: Sinus rhythm.  Rate 75 bpm.  PACs.  06/29/17: Sinus rhythm.  Rate 83 bpm.  IVCD.  10/25/17: Atrial fibrillation.  Rate 115 bpm.  LAFB.   12/20/17: Sinus rhythm.  Sinus arrhythmia.  First-degree AV block.  Ventricular rate 68 bpm.  LAFB. 04/06/18: Atypical atrial flutter rate 106 bpm.    Lexiscan Myoview 04/21/18:  The left ventricular ejection fraction is normal (55-65%).  Nuclear stress EF: 60%.  There was no ST segment deviation noted during stress.  The study is normal.  This is a low risk study.   Soft tissue attenuation of anterior wall No ischemia or infarction Estimated EF 60%  Echo 12/11/15: Study Conclusions  - Left ventricle: The cavity size was normal. There was severe   focal basal hypertrophy of the septum. Systolic function was   normal. The estimated ejection fraction was in the range of 60%   to 65%. Wall  motion was normal; there were no regional wall   motion abnormalities. Features are consistent with a pseudonormal   left ventricular filling pattern, with concomitant abnormal   relaxation and increased filling pressure (grade 2 diastolic   dysfunction). - Aortic valve: Trileaflet; mildly thickened, mildly calcified   leaflets. There was trivial regurgitation. - Aorta: Aortic root dimension: 41 mm (ED). - Ascending aorta: The ascending aorta was mildly dilated. - Left atrium: The atrium was moderately dilated.   Anterior-posterior dimension: 55 mm. Volume/bsa, ES, (1-plane   Simpson&'s, A2C): 40.2 ml/m^2.  Echo 07/08/15: Study Conclusions  - Left ventricle: The cavity size was normal. Wall thickness was increased in a pattern of moderate LVH. Systolic function was normal. The estimated ejection fraction was in the range of 60% to 65%. The study is not technically sufficient to allow evaluation of LV diastolic function. - Aortic valve: Trileaflet. Sclerosis without stenosis. There was mild regurgitation. - Mitral valve: Mildly thickened leaflets . There was mild regurgitation. - Left atrium: Moderately dilated at 43 ml/m2. - Right atrium: The atrium was mildly dilated. - Tricuspid valve: There was mild regurgitation. - Pulmonary arteries: PA peak pressure: 31 mm Hg (S). - Inferior vena cava: The vessel was normal in size. The respirophasic diameter changes were in the normal range (= 50%), consistent with normal central venous pressure.  Impressions:  - LVEF 60-65%, moderate LVH, normal wall motion, aortic valve sclerosis with mild AI,  MR and TR, RVSP 31 mmHg., moderate LAE, mild RAE.   Recent Labs: 06/29/2017: ALT 21; TSH 3.800 04/06/2018: BUN 25; Creatinine, Ser 0.95; Hemoglobin 12.0; Magnesium 1.5; Platelets 226; Potassium 4.1; Sodium 144    Lipid Panel No results found for: CHOL, TRIG, HDL, CHOLHDL, VLDL, LDLCALC, LDLDIRECT    Wt Readings from  Last 3 Encounters:  05/09/18 281 lb 6.4 oz (127.6 kg)  04/20/18 293 lb (132.9 kg)  04/19/18 292 lb (132.5 kg)     Other studies Reviewed: Additional studies/ records that were reviewed today include: n/a  ASSESSMENT AND PLAN:  # Atrial fibrillation/flutter: # PVCs: Eric Mayer is back in atrial fibrillation.  His last cardioversion was less than 2 weeks ago.  He is scheduled to see Dr. Rayann Heman next week to discuss ablation or other options.  Given that he is going in and out of atrial fibrillation so quickly does not seem that having him do a cardioversion again right now would be helpful.  We will instead increase his amiodarone to 200 mg twice daily to help him increase likelihood of staying in sinus rhythm.  Continue Eliquis, metoprolol and diltiazem and keep follow-up with Dr. Rayann Heman as scheduled.  Check BMP and magnesium.  This patients CHA2DS2-VASc Score and unadjusted Ischemic Stroke Rate (% per year) is equal to 2.2 % stroke rate/year from a score of 2  Above score calculated as 1 point each if present [CHF, HTN, DM, Vascular=MI/PAD/Aortic Plaque, Age if 65-74, or Male]  Above score calculated as 2 points each if present [Age > 75, or Stroke/TIA/TE]  # Atypical Chest pain: Only occurs when tachycardic. Lexiscan Myoview was negative for ischemia 04/2018.  # Hypertension:  BP well controlled on losartan, metoprolol, and diltiazem.  Losartan was reduced due to positional dizziness. Goal is <140/90.  # Chronic diastolic heart failure: Euvolemic today.  Continue bumex and prn metolazone.  Check BMP and magnesium.   # CV Disease Prevention: Check lipids/CMP.    Current medicines are reviewed at length with the patient today.  The patient does not have concerns regarding medicines.  The following changes have been made: increase metoprolol  Labs/ tests ordered today include:  Orders Placed This Encounter  Procedures  . Lipid panel  . Comprehensive metabolic panel  . Magnesium       Disposition:   FU with Dr. Oval Linsey in 1 month.    Signed, [name], MD  05/09/2018 9:42 AM    [place]

## 2018-05-09 NOTE — Patient Instructions (Addendum)
Medication Instructions:  INCREASE YOUR AMIODARONE 200 MG TWICE A DAY   Labwork: BMET/MAGNESIUM TODAY   Testing/Procedures: NONE  Follow-Up: Your physician recommends that you schedule a follow-up appointment in: 2 MONTHS   If you need a refill on your cardiac medications before your next appointment, please call your pharmacy.

## 2018-05-10 ENCOUNTER — Telehealth: Payer: Self-pay | Admitting: *Deleted

## 2018-05-10 NOTE — Telephone Encounter (Signed)
-----   Message from [name], MD sent at 05/09/2018  4:48 PM EDT ----- Triglycerides are elevated but cholesterol levels are otherwise reasonably well-controlled.  Potassium is a little bit low.  This is probably from taking metolazone.  Magnesium is also a little bit low.  Recommend taking an extra magnesium oxide 400 mg x 1 and potassium chloride 40 mEq.  This is in addition to his baseline supplementation.

## 2018-05-11 ENCOUNTER — Ambulatory Visit: Payer: Medicare Other | Admitting: Cardiovascular Disease

## 2018-05-15 ENCOUNTER — Telehealth: Payer: Self-pay | Admitting: Cardiovascular Disease

## 2018-05-15 MED ORDER — POTASSIUM CHLORIDE CRYS ER 20 MEQ PO TBCR: 40.0000 meq | EXTENDED_RELEASE_TABLET | Freq: Every day | ORAL | 0 refills | Status: DC

## 2018-05-15 NOTE — Telephone Encounter (Signed)
Spoke with pt. Pt sts that he spoke with Dr.Bonne Terre's nurse Rip Harbour on 05/10/18. He was given instructions to take an additional Mag due to low Mag and he was to start Potassium 40 mEq daily. Pt sts that he was not previously prescribed Potassium. Pt sts that the Potassium was not sent to his pharmacy. Rx sent to pt pharmacy for 40 mEq #60 R-1. Adv pt that I will fwd an update to [place] [facility] [place] to adv if this will need to be a long-term medication and if f/u labs need to be scheduled. Pt agreeable with plan

## 2018-05-15 NOTE — Telephone Encounter (Signed)
New message:A       *STAT* If patient is at the pharmacy, call can be transferred to refill team.   1. Which medications need to be refilled? (please list name of each medication and dose if known) magnesium oxide (MAG-OX) 400 (241.3 Mg) MG tablet  Potassium  2. Which pharmacy/location (including street and city if local pharmacy) [facility] [place]  3. Do they need a 30 day or 90 day supply? [place]

## 2018-05-16 ENCOUNTER — Encounter: Payer: Self-pay | Admitting: Internal Medicine

## 2018-05-17 ENCOUNTER — Ambulatory Visit (INDEPENDENT_AMBULATORY_CARE_PROVIDER_SITE_OTHER): Payer: Medicare Other | Admitting: Internal Medicine

## 2018-05-17 ENCOUNTER — Encounter: Payer: Self-pay | Admitting: Internal Medicine

## 2018-05-17 VITALS — BP 112/62 | HR 75 | Ht 68.0 in | Wt 285.0 lb

## 2018-05-17 DIAGNOSIS — I4892 Unspecified atrial flutter: Secondary | ICD-10-CM

## 2018-05-17 DIAGNOSIS — I480 Paroxysmal atrial fibrillation: Secondary | ICD-10-CM | POA: Diagnosis not present

## 2018-05-17 DIAGNOSIS — Z23 Encounter for immunization: Secondary | ICD-10-CM | POA: Diagnosis not present

## 2018-05-17 NOTE — Patient Instructions (Addendum)
Medication Instructions:  Your physician recommends that you continue on your current medications as directed. Please refer to the Current Medication list given to you today. If you need a refill on your cardiac medications before your next appointment, please call your pharmacy.  Labwork: You will get lab work today:  BMP and CBC  Testing/Procedures:  Your physician has requested that you have cardiac CT. Cardiac computed tomography (CT) is a painless test that uses an x-ray machine to take clear, detailed pictures of your heart. For further information please visit [email or web address]. Please follow instruction sheet as given. You will get a call from our office to schedule the date for this test.  Your physician has recommended that you have an ablation. Catheter ablation is a medical procedure used to treat some cardiac arrhythmias (irregular heartbeats). During catheter ablation, a long, thin, flexible tube is put into a blood vessel in your groin (upper thigh), or neck. This tube is called an ablation catheter. It is then guided to your heart through the blood vessel. Radio frequency waves destroy small areas of heart tissue where abnormal heartbeats may cause an arrhythmia to start. Please see the instruction sheet given to you today.  Follow-Up: You will follow up with Roderic Palau, NP with the Atrial fibrillation (Afib) clinic 4 weeks after your ablation.  You will follow up with Dr. Rayann Heman 3 months after your procedure.  CARDIAC CT INSTRUCTIONS:  Please arrive at the [facility] main entrance of [facility] [place], [place] [address] [phone]  Proceed to the [facility] Radiology Department (First Floor).  Please follow these instructions carefully (unless otherwise directed):  On the Night Before the Test: . Drink plenty of water. . Do not consume any caffeinated/decaffeinated beverages or chocolate 12 hours prior to  your test. . Do not take any antihistamines 12 hours prior to your test.  On the Day of the Test: . Drink plenty of water. Do not drink any water within one hour of the test. . Do not eat any food 4 hours prior to the test. . You may take your regular medications prior to the test. . MAKE sure you take your metoprolol . HOLD BUMEX morning of the test.  After the Test: . Drink plenty of water. . After receiving IV contrast, you may experience a mild flushed feeling. This is normal. . On occasion, you may experience a mild rash up to 24 hours after the test. This is not dangerous. If this occurs, you can take Benadryl 25 mg and increase your fluid intake. . If you experience trouble breathing, this can be serious. If it is severe call 911 IMMEDIATELY. If it is mild, please call our office.    ABLATION INSTRUCTIONS:  Please arrive at the [facility] main entrance of [facility] hospital at: 5:30 am on June 08, 2018 Do not eat or drink after midnight prior to procedure On the morning of your procedure do not take any medications. Plan for one night stay.   You will need someone to drive you home at discharge.    Cardiac Ablation Cardiac ablation is a procedure to disable (ablate) a small amount of heart tissue in very specific places. The heart has many electrical connections. Sometimes these connections are abnormal and can cause the heart to beat very fast or irregularly. Ablating some of the problem areas can improve the heart rhythm or return it to normal. Ablation may be done for people  who:  Have Wolff-Parkinson-White syndrome.  Have fast heart rhythms (tachycardia).  Have taken medicines for an abnormal heart rhythm (arrhythmia) that were not effective or caused side effects.  Have a high-risk heartbeat that may be life-threatening.  During the procedure, a small incision is made in the neck or the groin, and a long, thin, flexible tube (catheter) is inserted into the  incision and moved to the heart. Small devices (electrodes) on the tip of the catheter will send out electrical currents. A type of X-ray (fluoroscopy) will be used to help guide the catheter and to provide images of the heart. Tell a health care provider about:  Any allergies you have.  All medicines you are taking, including vitamins, herbs, eye drops, creams, and over-the-counter medicines.  Any problems you or family members have had with anesthetic medicines.  Any blood disorders you have.  Any surgeries you have had.  Any medical conditions you have, such as kidney failure.  Whether you are pregnant or may be pregnant. What are the risks? Generally, this is a safe procedure. However, problems may occur, including:  Infection.  Bruising and bleeding at the catheter insertion site.  Bleeding into the chest, especially into the sac that surrounds the heart. This is a serious complication.  Stroke or blood clots.  Damage to other structures or organs.  Allergic reaction to medicines or dyes.  Need for a permanent pacemaker if the normal electrical system is damaged. A pacemaker is a small computer that sends electrical signals to the heart and helps your heart beat normally.  The procedure not being fully effective. This may not be recognized until months later. Repeat ablation procedures are sometimes required.  What happens before the procedure?  Follow instructions from your health care provider about eating or drinking restrictions.  Ask your health care provider about: ? Changing or stopping your regular medicines. This is especially important if you are taking diabetes medicines or blood thinners. ? Taking medicines such as aspirin and ibuprofen. These medicines can thin your blood. Do not take these medicines before your procedure if your health care provider instructs you not to.  Plan to have someone take you home from the hospital or clinic.  If you will be  going home right after the procedure, plan to have someone with you for 24 hours. What happens during the procedure?  To lower your risk of infection: ? Your health care team will wash or sanitize their hands. ? Your skin will be washed with soap. ? Hair may be removed from the incision area.  An IV tube will be inserted into one of your veins.  You will be given a medicine to help you relax (sedative).  The skin on your neck or groin will be numbed.  An incision will be made in your neck or your groin.  A needle will be inserted through the incision and into a large vein in your neck or groin.  A catheter will be inserted into the needle and moved to your heart.  Dye may be injected through the catheter to help your surgeon see the area of the heart that needs treatment.  Electrical currents will be sent from the catheter to ablate heart tissue in desired areas. There are three types of energy that may be used to ablate heart tissue: ? Heat (radiofrequency energy). ? Laser energy. ? Extreme cold (cryoablation).  When the necessary tissue has been ablated, the catheter will be removed.  Pressure will be  held on the catheter insertion area to prevent excessive bleeding.  A bandage (dressing) will be placed over the catheter insertion area. The procedure may vary among health care providers and hospitals. What happens after the procedure?  Your blood pressure, heart rate, breathing rate, and blood oxygen level will be monitored until the medicines you were given have worn off.  Your catheter insertion area will be monitored for bleeding. You will need to lie still for a few hours to ensure that you do not bleed from the catheter insertion area.  Do not drive for 24 hours or as long as directed by your health care provider. Summary  Cardiac ablation is a procedure to disable (ablate) a small amount of heart tissue in very specific places. Ablating some of the problem areas can  improve the heart rhythm or return it to normal.  During the procedure, electrical currents will be sent from the catheter to ablate heart tissue in desired areas. This information is not intended to replace advice given to you by your health care provider. Make sure you discuss any questions you have with your health care provider. Document Released: 12/19/2008 Document Revised: 06/21/2016 Document Reviewed: 06/21/2016 Elsevier Interactive Patient Education  [employer].

## 2018-05-17 NOTE — Progress Notes (Signed)
Electrophysiology Office Note   Date:  05/17/2018   ID:  Eric Mayer, DOB Apr 27, 1941, MRN [id number]  PCP:  [name], MD  Cardiologist:  Dr Oval Linsey Primary Electrophysiologist: Thompson Grayer, MD    CC: afib and atrial flutter   History of Present Illness: Eric Mayer is a 77 y.o. male who presents today for electrophysiology evaluation.   He is referred by Roderic Palau NP and Dr Oval Linsey for EP consultation regarding afib.   He was intitially diagnosed with atrial fibrillation in 2016.  He was found to have decompensated diastolic heart failure at that time.  He was cardioverted.  He did well but returned to afib several months later.  He was started on amiodarone.  He has been on amiodarone since 2017.  He has recurrent afib 2/19 and eventually was cardioverted.  He subsequently developed atrial flutter requiring repeat cardioversion.   He has continues to struggle with atrial arrhythmias since that time.  During afib, he has fatigue and decreased exercise tolerance.  Today, he denies symptoms of palpitations, chest pain, shortness of breath, orthopnea, PND, lower extremity edema, claudication, dizziness, presyncope, syncope, bleeding, or neurologic sequela. The patient is tolerating medications without difficulties and is otherwise without complaint today.    Past Medical History:  Diagnosis Date  . Anemia 1980s X 1  . Arthritis    "hands, knees, hips, ankles" (07/31/2015)  . Atrial flutter ([place]) 07/31/2015  . Chronic diastolic heart failure ([place])    a. 11/2015: Echo w/ EF of 60-65%, no WMA, Grade 2 DD, trivial AR, ascending aorta mildly dilated.   . Depression   . History of cardiovascular stress test    a. 03/2015: Dobutamine Stress Echo with no evidence of ischemia.   [name] History of gastric bypass   . History of peptic ulcer    1980's  . History of radiation therapy 1993   sarcoma of left groin, tx at [facility]  . History of sarcoma    1993  LEFT GOIN--  S/P  SURGERY, RADIATION AND CHEMO IN [place]  . Hypertension   . Hypogonadism in male   . Incomplete right bundle branch block   . 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 Nocturia   . OSA on CPAP   . Persistent atrial fibrillation       . 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 PVC's (premature ventricular contractions) 11/21/2015  . Sarcoma ([place]) ~ 1993/1994   "of groin"  . Weakness of left leg    SECONDARY TO SURGICAL NERVE INJURY OF LEFT GOIN  . Wears glasses    Past Surgical History:  Procedure Laterality Date  . CARDIAC CATHETERIZATION  08-12-2003  dr Tressia Miners turner   Normal coronary arteries, normal wall motion, no sig. abnormalities  . CARDIOVERSION N/A 08/04/2015   Procedure: CARDIOVERSION;  Surgeon: [name], MD;  Location: [place];  Service: Cardiovascular;  Laterality: N/A;  . CARDIOVERSION N/A 10/28/2017   Procedure: CARDIOVERSION;  Surgeon: Larey Dresser, MD;  Location: [facility] ENDOSCOPY;  Service: Cardiovascular;  Laterality: N/A;  . CARDIOVERSION N/A 04/12/2018   Procedure: CARDIOVERSION;  Surgeon: Thayer Headings, MD;  Location: [facility] ENDOSCOPY;  Service: Cardiovascular;  Laterality: N/A;  . COLONOSCOPY  07-03-2002  . [place]  . FRACTURE SURGERY    . [place]  [place]   SARCOMA SURGERY  . INGUINAL HERNIA REPAIR Right 1950s?  . INTESTINAL BYPASS  1976   GASTRIC FOR OBESITY  . JOINT REPLACEMENT    . PATELLA FRACTURE SURGERY Left ~ 1995   "broke it twice; only had OR once"  . PROSTATE BIOPSY  06/2014  . RADIOACTIVE SEED IMPLANT N/A 10/17/2014   Procedure: RADIOACTIVE SEED IMPLANT    ;  Surgeon: Ailene Rud, MD;  Location: [facility];  Service: Urology;  Laterality: N/A;   68 SEEDS IMPLANTED   . TEE WITHOUT CARDIOVERSION N/A 10/28/2017   Procedure: TRANSESOPHAGEAL  ECHOCARDIOGRAM (TEE);  Surgeon: Larey Dresser, MD;  Location: [facility] ENDOSCOPY;  Service: Cardiovascular;  Laterality: N/A;  . TONSILLECTOMY  1950s  . TOTAL KNEE ARTHROPLASTY Right 12/17/2014   Procedure: TOTAL KNEE ARTHROPLASTY;  Surgeon: Melrose Nakayama, MD;  Location: [place];  Service: Orthopedics;  Laterality: Right;  . TRANSTHORACIC ECHOCARDIOGRAM  08-08-2003   moderate LVH/  ef 55-65%/  mild MR/  moderate LAE/  trivial TR/  trivial pericardial effusion posterior to the heart  . [place]  . VIDEO BRONCHOSCOPY Bilateral 09/21/2016   Procedure: VIDEO BRONCHOSCOPY WITHOUT FLUORO;  Surgeon: Collene Gobble, MD;  Location: [place];  Service: Cardiopulmonary;  Laterality: Bilateral;     Current Outpatient Medications  Medication Sig Dispense Refill  . acetaminophen (TYLENOL) 325 MG tablet Take 2 tablets (650 mg total) by mouth every 4 (four) hours as needed for headache or mild pain.    [name] albuterol (PROVENTIL HFA;VENTOLIN HFA) 108 (90 Base) MCG/ACT inhaler Inhale 2 puffs into the lungs every 6 (six) hours as needed for wheezing or shortness of breath.    [name] amiodarone (PACERONE) 200 MG tablet Take 1 tablet (200 mg total) by mouth 2 (two) times daily. 180 tablet 3  . bumetanide (BUMEX) 2 MG tablet TAKE 1 TABLET BY MOUTH DAILY AND AN EXTRA TABLET FOR WEIGHT GAIN 2 POUNDS DAILY OR 5 POUNDS WEEKLY (Patient taking differently: Take 2 mg by mouth See admin instructions. TAKE 2 MG BY MOUTH DAILY AND AN EXTRA 2 MG FOR WEIGHT GAIN 2 POUNDS DAILY OR 5 POUNDS WEEKLY) 180 tablet 3  . buPROPion (WELLBUTRIN XL) 300 MG 24 hr tablet Take 300 mg by mouth every morning.    . cyanocobalamin (,VITAMIN B-12,) 1000 MCG/ML injection Inject 1,000 mcg into the muscle every 30 (thirty) days.    [name] diltiazem (TIAZAC) 180 MG 24 hr capsule Take 1 capsule (180 mg total) by mouth every morning. 14 capsule 1  . ELIQUIS 5 MG TABS tablet TAKE 1 TABLET BY MOUTH 2 TIMES DAILY. (Patient taking differently: Take 5 mg by  mouth 2 (two) times daily. ) 180 tablet 1  . escitalopram (LEXAPRO) 10 MG tablet Take 10 mg by mouth daily.    [name] losartan (COZAAR) 25 MG tablet Take 1 tablet (25 mg total) by mouth daily. 90 tablet 3  . magnesium oxide (MAG-OX) 400 (241.3 Mg) MG tablet 2 tablets in the morning and 1 tablet in the evening 270 tablet 3  . metoprolol tartrate (LOPRESSOR) 50 MG tablet Take 1 tablet (50 mg total) by mouth 2 (two) times daily. 180 tablet 3  . Polyethyl Glycol-Propyl Glycol (SYSTANE ULTRA) 0.4-0.3 % SOLN Place 1 drop into both eyes daily as needed (for dry eyes).    . potassium chloride SA (K-DUR,KLOR-CON) 20 MEQ tablet Take 2 tablets (40 mEq total) by mouth daily. 60 tablet 0  . tamsulosin (  FLOMAX) 0.4 MG CAPS capsule Take 0.8 mg by mouth at bedtime.      Current Facility-Administered Medications  Medication Dose Route Frequency Provider Last Rate Last Dose  . 0.9 %  sodium chloride infusion  250 mL Intravenous Continuous [name], MD      . sodium chloride flush (NS) 0.9 % injection 3 mL  3 mL Intravenous Q12H [name], MD      . sodium chloride flush (NS) 0.9 % injection 3 mL  3 mL Intravenous PRN [name], MD        Allergies:   Ace inhibitors and Xarelto [rivaroxaban]   Social History:  The patient  reports that he has never smoked. He has never used smokeless tobacco. He reports that he does not drink alcohol or use drugs.   Family History:  The patient's  family history includes Atrial fibrillation in his father; Cancer in his sister; Liver disease in his mother; Renal Disease in his mother; Stroke in his mother.    ROS:  Please see the history of present illness.   All other systems are personally reviewed and negative.    PHYSICAL EXAM: VS:  BP 112/62   Pulse 75   Ht 5\' 8"  (1.727 m)   Wt 285 lb (129.3 kg)   SpO2 96%   BMI 43.33 kg/m  , BMI Body mass index is 43.33 kg/m.  GEN: overweight, in no acute distress  HEENT: normal  Neck: no JVD, carotid  bruits, or masses Cardiac: RRR; no murmurs, rubs, or gallops,no edema  Respiratory:  clear to auscultation bilaterally, normal work of breathing GI: soft, nontender, nondistended, + BS MS: no deformity or atrophy  Skin: warm and dry  Neuro:  Strength and sensation are intact Psych: euthymic mood, full affect  EKG:  EKG is ordered today. The ekg ordered today is personally reviewed and shows sinus rhythm 75 bpm, otherwise normal ekg  Recent Labs: 06/29/2017: TSH 3.800 04/06/2018: Hemoglobin 12.0; Platelets 226 05/09/2018: ALT 60; BUN 34; Creatinine, Ser 1.23; Magnesium 1.5; Potassium 3.4; Sodium 144  personally reviewed   Lipid Panel     Component Value Date/Time   CHOL 167 05/09/2018 0913   TRIG 164 (H) 05/09/2018 0913   HDL 46 05/09/2018 0913   CHOLHDL 3.6 05/09/2018 0913   LDLCALC 88 05/09/2018 0913   personally reviewed   Wt Readings from Last 3 Encounters:  05/17/18 285 lb (129.3 kg)  05/09/18 281 lb 6.4 oz (127.6 kg)  04/20/18 293 lb (132.9 kg)     Other studies personally reviewed: Additional studies/ records that were reviewed today include: AF clinic notes, Dr Blenda Mounts notes  Review of the above records today demonstrates: TEE 10/28/17 reveals EF 55%, mild AI, mild to moderate MR, moderate atrial enlargement   ASSESSMENT AND PLAN:  1.  Persistent afib/ atrial flutter The patient has symptomatic, recurrent persistent atrial fibrillation and atrial flutter.  He has required several prior cardioversions he has failed medical therapy with amiodarone. Chads2vasc score is 4.  he is anticoagulated with eliquis . Therapeutic strategies for afib including medicine and ablation were discussed in detail with the patient today. Risk, benefits, and alternatives to EP study and radiofrequency ablation for afib were also discussed in detail today. These risks include but are not limited to stroke, bleeding, vascular damage, tamponade, perforation, damage to the esophagus, lungs,  and other structures, pulmonary vein stenosis, worsening renal function, and death. The patient understands these risk and wishes to proceed.  We will  therefore proceed with catheter ablation at the next available time.  Carto, ICE, anesthesia are requested for the procedure.  Will also obtain cardiac CT prior to the procedure to exclude LAA thrombus and further evaluate atrial anatomy.  2. Chronic diastolic dysfunction Stable No change required today  3. HTN Stable No change required today  4. Obesity Body mass index is 43.33 kg/m. Wt Readings from Last 3 Encounters:  05/17/18 285 lb (129.3 kg)  05/09/18 281 lb 6.4 oz (127.6 kg)  04/20/18 293 lb (132.9 kg)   Lifestyle modification encouraged I have reviewed the patients BMI and decreased success rates with ablation at length today.  Weight loss is strongly advised.  Per Guijian et al (PACE 2013; 36: 710-626), patients with BMI 25-29.9 (obese) have a 27% increase in AF recurrence post ablation.  Patients with BMI >30 have a 31% increase in AF recurrence post ablation when compared to those with BMI <25.   Current medicines are reviewed at length with the patient today.   The patient does not have concerns regarding his medicines.  The following changes were made today:  none    Signed, Thompson Grayer, MD  05/17/2018 12:36 PM     [address] [place] [address] [phone] (office) [phone] (fax)

## 2018-05-17 NOTE — H&P (View-Only) (Signed)
Electrophysiology Office Note   Date:  05/17/2018   ID:  Eric Mayer, DOB [birth year], MRN [id number]  PCP:  [name], MD  Cardiologist:  Dr Oval Linsey Primary Electrophysiologist: Thompson Grayer, MD    CC: afib and atrial flutter   History of Present Illness: Eric Mayer is a 77 y.o. male who presents today for electrophysiology evaluation.   He is referred by Roderic Palau NP and Dr Oval Linsey for EP consultation regarding afib.   He was intitially diagnosed with atrial fibrillation in 2016.  He was found to have decompensated diastolic heart failure at that time.  He was cardioverted.  He did well but returned to afib several months later.  He was started on amiodarone.  He has been on amiodarone since 2017.  He has recurrent afib 2/19 and eventually was cardioverted.  He subsequently developed atrial flutter requiring repeat cardioversion.   He has continues to struggle with atrial arrhythmias since that time.  During afib, he has fatigue and decreased exercise tolerance.  Today, he denies symptoms of palpitations, chest pain, shortness of breath, orthopnea, PND, lower extremity edema, claudication, dizziness, presyncope, syncope, bleeding, or neurologic sequela. The patient is tolerating medications without difficulties and is otherwise without complaint today.    Past Medical History:  Diagnosis Date  . Anemia 1980s X 1  . Arthritis    "hands, knees, hips, ankles" (07/31/2015)  . Atrial flutter ([place]) 07/31/2015  . Chronic diastolic heart failure ([place])    a. 11/2015: Echo w/ EF of 60-65%, no WMA, Grade 2 DD, trivial AR, ascending aorta mildly dilated.   . Depression   . History of cardiovascular stress test    a. 03/2015: Dobutamine Stress Echo with no evidence of ischemia.   [name] History of gastric bypass   . History of peptic ulcer    1980's  . History of radiation therapy 1993   sarcoma of left groin, tx at [facility]  . History of sarcoma    1993  LEFT GOIN--  S/P  SURGERY, RADIATION AND CHEMO IN [place]  . Hypertension   . Hypogonadism in male   . Incomplete right bundle branch block   . 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 Nocturia   . OSA on CPAP   . Persistent atrial fibrillation       . 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 PVC's (premature ventricular contractions) 11/21/2015  . 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 Weakness of left leg    SECONDARY TO SURGICAL NERVE INJURY OF LEFT GOIN  . Wears glasses    Past Surgical History:  Procedure Laterality Date  . CARDIAC CATHETERIZATION  08-12-2003  dr Tressia Miners turner   Normal coronary arteries, normal wall motion, no sig. abnormalities  . CARDIOVERSION N/A 08/04/2015   Procedure: CARDIOVERSION;  Surgeon: [name], MD;  Location: [name];  Service: Cardiovascular;  Laterality: N/A;  . CARDIOVERSION N/A 10/28/2017   Procedure: CARDIOVERSION;  Surgeon: Larey Dresser, MD;  Location: [facility] ENDOSCOPY;  Service: Cardiovascular;  Laterality: N/A;  . CARDIOVERSION N/A 04/12/2018   Procedure: CARDIOVERSION;  Surgeon: Thayer Headings, MD;  Location: [facility] ENDOSCOPY;  Service: Cardiovascular;  Laterality: N/A;  . COLONOSCOPY  07-03-2002  . [place]  . FRACTURE SURGERY    . [place]  [place]   SARCOMA SURGERY  . INGUINAL HERNIA REPAIR Right 1950s?  . INTESTINAL BYPASS  1976   GASTRIC FOR OBESITY  . JOINT REPLACEMENT    . PATELLA FRACTURE SURGERY Left ~ 1995   "broke it twice; only had OR once"  . PROSTATE BIOPSY  06/2014  . RADIOACTIVE SEED IMPLANT N/A 10/17/2014   Procedure: RADIOACTIVE SEED IMPLANT    ;  Surgeon: Ailene Rud, MD;  Location: [facility];  Service: Urology;  Laterality: N/A;   68 SEEDS IMPLANTED   . TEE WITHOUT CARDIOVERSION N/A 10/28/2017   Procedure: TRANSESOPHAGEAL  ECHOCARDIOGRAM (TEE);  Surgeon: Larey Dresser, MD;  Location: [facility] ENDOSCOPY;  Service: Cardiovascular;  Laterality: N/A;  . TONSILLECTOMY  1950s  . TOTAL KNEE ARTHROPLASTY Right 12/17/2014   Procedure: TOTAL KNEE ARTHROPLASTY;  Surgeon: Melrose Nakayama, MD;  Location: [facility];  Service: Orthopedics;  Laterality: Right;  . TRANSTHORACIC ECHOCARDIOGRAM  08-08-2003   moderate LVH/  ef 55-65%/  mild MR/  moderate LAE/  trivial TR/  trivial pericardial effusion posterior to the heart  . [place]  . VIDEO BRONCHOSCOPY Bilateral 09/21/2016   Procedure: VIDEO BRONCHOSCOPY WITHOUT FLUORO;  Surgeon: Collene Gobble, MD;  Location: [facility];  Service: Cardiopulmonary;  Laterality: Bilateral;     Current Outpatient Medications  Medication Sig Dispense Refill  . acetaminophen (TYLENOL) 325 MG tablet Take 2 tablets (650 mg total) by mouth every 4 (four) hours as needed for headache or mild pain.    [name] albuterol (PROVENTIL HFA;VENTOLIN HFA) 108 (90 Base) MCG/ACT inhaler Inhale 2 puffs into the lungs every 6 (six) hours as needed for wheezing or shortness of breath.    [name] amiodarone (PACERONE) 200 MG tablet Take 1 tablet (200 mg total) by mouth 2 (two) times daily. 180 tablet 3  . bumetanide (BUMEX) 2 MG tablet TAKE 1 TABLET BY MOUTH DAILY AND AN EXTRA TABLET FOR WEIGHT GAIN 2 POUNDS DAILY OR 5 POUNDS WEEKLY (Patient taking differently: Take 2 mg by mouth See admin instructions. TAKE 2 MG BY MOUTH DAILY AND AN EXTRA 2 MG FOR WEIGHT GAIN 2 POUNDS DAILY OR 5 POUNDS WEEKLY) 180 tablet 3  . buPROPion (WELLBUTRIN XL) 300 MG 24 hr tablet Take 300 mg by mouth every morning.    . cyanocobalamin (,VITAMIN B-12,) 1000 MCG/ML injection Inject 1,000 mcg into the muscle every 30 (thirty) days.    [name] diltiazem (TIAZAC) 180 MG 24 hr capsule Take 1 capsule (180 mg total) by mouth every morning. 14 capsule 1  . ELIQUIS 5 MG TABS tablet TAKE 1 TABLET BY MOUTH 2 TIMES DAILY. (Patient taking differently: Take 5 mg by  mouth 2 (two) times daily. ) 180 tablet 1  . escitalopram (LEXAPRO) 10 MG tablet Take 10 mg by mouth daily.    [name] losartan (COZAAR) 25 MG tablet Take 1 tablet (25 mg total) by mouth daily. 90 tablet 3  . magnesium oxide (MAG-OX) 400 (241.3 Mg) MG tablet 2 tablets in the morning and 1 tablet in the evening 270 tablet 3  . metoprolol tartrate (LOPRESSOR) 50 MG tablet Take 1 tablet (50 mg total) by mouth 2 (two) times daily. 180 tablet 3  . Polyethyl Glycol-Propyl Glycol (SYSTANE ULTRA) 0.4-0.3 % SOLN Place 1 drop into both eyes daily as needed (for dry eyes).    . potassium chloride SA (K-DUR,KLOR-CON) 20 MEQ tablet Take 2 tablets (40 mEq total) by mouth daily. 60 tablet 0  . tamsulosin (  FLOMAX) 0.4 MG CAPS capsule Take 0.8 mg by mouth at bedtime.      Current Facility-Administered Medications  Medication Dose Route Frequency Provider Last Rate Last Dose  . 0.9 %  sodium chloride infusion  250 mL Intravenous Continuous [name], MD      . sodium chloride flush (NS) 0.9 % injection 3 mL  3 mL Intravenous Q12H [name], MD      . sodium chloride flush (NS) 0.9 % injection 3 mL  3 mL Intravenous PRN [name], MD        Allergies:   Ace inhibitors and Xarelto [rivaroxaban]   Social History:  The patient  reports that he has never smoked. He has never used smokeless tobacco. He reports that he does not drink alcohol or use drugs.   Family History:  The patient's  family history includes Atrial fibrillation in his father; Cancer in his sister; Liver disease in his mother; Renal Disease in his mother; Stroke in his mother.    ROS:  Please see the history of present illness.   All other systems are personally reviewed and negative.    PHYSICAL EXAM: VS:  BP 112/62   Pulse 75   Ht 5\' 8"  (1.727 m)   Wt 285 lb (129.3 kg)   SpO2 96%   BMI 43.33 kg/m  , BMI Body mass index is 43.33 kg/m.  GEN: overweight, in no acute distress  HEENT: normal  Neck: no JVD, carotid  bruits, or masses Cardiac: RRR; no murmurs, rubs, or gallops,no edema  Respiratory:  clear to auscultation bilaterally, normal work of breathing GI: soft, nontender, nondistended, + BS MS: no deformity or atrophy  Skin: warm and dry  Neuro:  Strength and sensation are intact Psych: euthymic mood, full affect  EKG:  EKG is ordered today. The ekg ordered today is personally reviewed and shows sinus rhythm 75 bpm, otherwise normal ekg  Recent Labs: 06/29/2017: TSH 3.800 04/06/2018: Hemoglobin 12.0; Platelets 226 05/09/2018: ALT 60; BUN 34; Creatinine, Ser 1.23; Magnesium 1.5; Potassium 3.4; Sodium 144  personally reviewed   Lipid Panel     Component Value Date/Time   CHOL 167 05/09/2018 0913   TRIG 164 (H) 05/09/2018 0913   HDL 46 05/09/2018 0913   CHOLHDL 3.6 05/09/2018 0913   LDLCALC 88 05/09/2018 0913   personally reviewed   Wt Readings from Last 3 Encounters:  05/17/18 285 lb (129.3 kg)  05/09/18 281 lb 6.4 oz (127.6 kg)  04/20/18 293 lb (132.9 kg)     Other studies personally reviewed: Additional studies/ records that were reviewed today include: AF clinic notes, Dr Blenda Mounts notes  Review of the above records today demonstrates: TEE 10/28/17 reveals EF 55%, mild AI, mild to moderate MR, moderate atrial enlargement   ASSESSMENT AND PLAN:  1.  Persistent afib/ atrial flutter The patient has symptomatic, recurrent persistent atrial fibrillation and atrial flutter.  He has required several prior cardioversions he has failed medical therapy with amiodarone. Chads2vasc score is 4.  he is anticoagulated with eliquis . Therapeutic strategies for afib including medicine and ablation were discussed in detail with the patient today. Risk, benefits, and alternatives to EP study and radiofrequency ablation for afib were also discussed in detail today. These risks include but are not limited to stroke, bleeding, vascular damage, tamponade, perforation, damage to the esophagus, lungs,  and other structures, pulmonary vein stenosis, worsening renal function, and death. The patient understands these risk and wishes to proceed.  We will  therefore proceed with catheter ablation at the next available time.  Carto, ICE, anesthesia are requested for the procedure.  Will also obtain cardiac CT prior to the procedure to exclude LAA thrombus and further evaluate atrial anatomy.  2. Chronic diastolic dysfunction Stable No change required today  3. HTN Stable No change required today  4. Obesity Body mass index is 43.33 kg/m. Wt Readings from Last 3 Encounters:  05/17/18 285 lb (129.3 kg)  05/09/18 281 lb 6.4 oz (127.6 kg)  04/20/18 293 lb (132.9 kg)   Lifestyle modification encouraged I have reviewed the patients BMI and decreased success rates with ablation at length today.  Weight loss is strongly advised.  Per Guijian et al (PACE 2013; 36: 161-096), patients with BMI 25-29.9 (obese) have a 27% increase in AF recurrence post ablation.  Patients with BMI >30 have a 31% increase in AF recurrence post ablation when compared to those with BMI <25.   Current medicines are reviewed at length with the patient today.   The patient does not have concerns regarding his medicines.  The following changes were made today:  none    Signed, Thompson Grayer, MD  05/17/2018 12:36 PM     [place] [address] [phone] (office) [phone] (fax)

## 2018-05-18 LAB — CBC WITH DIFFERENTIAL/PLATELET
Basophils Absolute: 0.1 10*3/uL (ref 0.0–0.2)
Basos: 1 %
EOS (ABSOLUTE): 0.3 10*3/uL (ref 0.0–0.4)
Eos: 4 %
Hematocrit: 38.3 % (ref 37.5–51.0)
Hemoglobin: 12.4 g/dL — ABNORMAL LOW (ref 13.0–17.7)
Immature Grans (Abs): 0 10*3/uL (ref 0.0–0.1)
Immature Granulocytes: 0 %
Lymphocytes Absolute: 2.6 10*3/uL (ref 0.7–3.1)
Lymphs: 27 %
MCH: 29.2 pg (ref 26.6–33.0)
MCHC: 32.4 g/dL (ref 31.5–35.7)
MCV: 90 fL (ref 79–97)
Monocytes Absolute: 0.8 10*3/uL (ref 0.1–0.9)
Monocytes: 9 %
Neutrophils Absolute: 5.7 10*3/uL (ref 1.4–7.0)
Neutrophils: 59 %
Platelets: 218 10*3/uL (ref 150–450)
RBC: 4.25 x10E6/uL (ref 4.14–5.80)
RDW: 14.2 % (ref 12.3–15.4)
WBC: 9.5 10*3/uL (ref 3.4–10.8)

## 2018-05-18 LAB — BASIC METABOLIC PANEL
BUN/Creatinine Ratio: 36 — ABNORMAL HIGH (ref 10–24)
BUN: 41 mg/dL — ABNORMAL HIGH (ref 8–27)
CO2: 30 mmol/L — ABNORMAL HIGH (ref 20–29)
Calcium: 9.5 mg/dL (ref 8.6–10.2)
Chloride: 98 mmol/L (ref 96–106)
Creatinine, Ser: 1.13 mg/dL (ref 0.76–1.27)
GFR calc Af Amer: 73 mL/min/{1.73_m2} (ref >59)
GFR calc non Af Amer: 63 mL/min/{1.73_m2} (ref >59)
Glucose: 101 mg/dL — ABNORMAL HIGH (ref 65–99)
Potassium: 4.7 mmol/L (ref 3.5–5.2)
Sodium: 142 mmol/L (ref 134–144)

## 2018-05-24 ENCOUNTER — Other Ambulatory Visit: Payer: Self-pay

## 2018-05-24 MED ORDER — BUMETANIDE 1 MG PO TABS: 1.0000 mg | ORAL_TABLET | Freq: Every day | ORAL | 3 refills | Status: DC

## 2018-06-02 MED ORDER — POTASSIUM CHLORIDE CRYS ER 20 MEQ PO TBCR: 40.0000 meq | EXTENDED_RELEASE_TABLET | Freq: Every day | ORAL | 3 refills | Status: DC

## 2018-06-02 MED ORDER — POTASSIUM CHLORIDE CRYS ER 20 MEQ PO TBCR: 20.0000 meq | EXTENDED_RELEASE_TABLET | Freq: Every day | ORAL | 3 refills | Status: DC

## 2018-06-02 NOTE — Telephone Encounter (Addendum)
Patient had labs 05/09/18 and K+ 40 meq added. He had follow up labs 05/17/18 with Dr Rayann Heman and Bumex was decreased from 2 mg to 1 mg. Discussed with Dr Oval Linsey and will have patient decrease K+ to 20 meq daily. Advised patient, verbalized understanding.

## 2018-06-05 ENCOUNTER — Ambulatory Visit (HOSPITAL_COMMUNITY)
Admission: RE | Admit: 2018-06-05 | Discharge: 2018-06-05 | Disposition: A | Discharge status: Home / Self Care | Payer: Medicare Other | Source: Ambulatory Visit | Attending: Internal Medicine | Admitting: Internal Medicine

## 2018-06-05 ENCOUNTER — Encounter (HOSPITAL_COMMUNITY): Payer: Self-pay

## 2018-06-05 ENCOUNTER — Ambulatory Visit (HOSPITAL_COMMUNITY): Admission: RE | Admit: 2018-06-05 | Payer: Medicare Other | Source: Ambulatory Visit

## 2018-06-05 DIAGNOSIS — I480 Paroxysmal atrial fibrillation: Secondary | ICD-10-CM

## 2018-06-05 DIAGNOSIS — I700 Atherosclerosis of aorta: Secondary | ICD-10-CM | POA: Insufficient documentation

## 2018-06-05 DIAGNOSIS — I4892 Unspecified atrial flutter: Secondary | ICD-10-CM | POA: Diagnosis not present

## 2018-06-05 MED ORDER — IOPAMIDOL (ISOVUE-370) INJECTION 76%
80.0000 mL | Freq: Once | INTRAVENOUS | Status: AC | PRN
  Administered 2018-06-05: 80 mL via INTRAVENOUS

## 2018-06-05 MED ORDER — NITROGLYCERIN 0.4 MG SL SUBL
SUBLINGUAL_TABLET | SUBLINGUAL | Status: AC
  Administered 2018-06-05: 0.4 mg
  Filled 2018-06-05: qty 2

## 2018-06-05 MED ORDER — METOPROLOL TARTRATE 5 MG/5ML IV SOLN
INTRAVENOUS | Status: AC
  Administered 2018-06-05: 5 mg
  Filled 2018-06-05: qty 20

## 2018-06-08 ENCOUNTER — Encounter (HOSPITAL_COMMUNITY)
Admission: RE | Disposition: A | Discharge status: Home / Self Care | Payer: Self-pay | Source: Ambulatory Visit | Attending: Internal Medicine

## 2018-06-08 ENCOUNTER — Encounter (HOSPITAL_COMMUNITY): Payer: Self-pay | Admitting: Anesthesiology

## 2018-06-08 ENCOUNTER — Ambulatory Visit (HOSPITAL_COMMUNITY)
Admission: RE | Admit: 2018-06-08 | Discharge: 2018-06-09 | Disposition: A | Discharge status: Home / Self Care | Payer: Medicare Other | Source: Ambulatory Visit | Attending: Internal Medicine | Admitting: Internal Medicine

## 2018-06-08 ENCOUNTER — Ambulatory Visit (HOSPITAL_COMMUNITY): Payer: Medicare Other | Admitting: Certified Registered Nurse Anesthetist

## 2018-06-08 DIAGNOSIS — Z6841 Body Mass Index (BMI) 40.0 and over, adult: Secondary | ICD-10-CM | POA: Insufficient documentation

## 2018-06-08 DIAGNOSIS — I483 Typical atrial flutter: Secondary | ICD-10-CM | POA: Insufficient documentation

## 2018-06-08 DIAGNOSIS — Z79899 Other long term (current) drug therapy: Secondary | ICD-10-CM | POA: Diagnosis not present

## 2018-06-08 DIAGNOSIS — I4819 Other persistent atrial fibrillation: Secondary | ICD-10-CM | POA: Diagnosis present

## 2018-06-08 DIAGNOSIS — E669 Obesity, unspecified: Secondary | ICD-10-CM | POA: Diagnosis not present

## 2018-06-08 DIAGNOSIS — Z9884 Bariatric surgery status: Secondary | ICD-10-CM | POA: Insufficient documentation

## 2018-06-08 DIAGNOSIS — Z7901 Long term (current) use of anticoagulants: Secondary | ICD-10-CM | POA: Diagnosis not present

## 2018-06-08 DIAGNOSIS — Z8546 Personal history of malignant neoplasm of prostate: Secondary | ICD-10-CM | POA: Insufficient documentation

## 2018-06-08 DIAGNOSIS — I110 Hypertensive heart disease with heart failure: Secondary | ICD-10-CM | POA: Diagnosis not present

## 2018-06-08 DIAGNOSIS — I5032 Chronic diastolic (congestive) heart failure: Secondary | ICD-10-CM | POA: Diagnosis not present

## 2018-06-08 DIAGNOSIS — Z96651 Presence of right artificial knee joint: Secondary | ICD-10-CM | POA: Diagnosis not present

## 2018-06-08 DIAGNOSIS — G4733 Obstructive sleep apnea (adult) (pediatric): Secondary | ICD-10-CM | POA: Insufficient documentation

## 2018-06-08 HISTORY — PX: ATRIAL FIBRILLATION ABLATION: EP1191

## 2018-06-08 LAB — POCT ACTIVATED CLOTTING TIME
Activated Clotting Time: 208 seconds
Activated Clotting Time: 224 seconds
Activated Clotting Time: 257 seconds
Activated Clotting Time: 213 seconds
Activated Clotting Time: 191 seconds

## 2018-06-08 SURGERY — ATRIAL FIBRILLATION ABLATION
Anesthesia: General

## 2018-06-08 MED ORDER — SODIUM CHLORIDE 0.9 % IV SOLN: 250.0000 mL | INTRAVENOUS | Status: DC | PRN

## 2018-06-08 MED ORDER — SODIUM CHLORIDE 0.9 % IV SOLN
INTRAVENOUS | Status: DC
  Administered 2018-06-08: 06:00:00 via INTRAVENOUS

## 2018-06-08 MED ORDER — HEPARIN SODIUM (PORCINE) 1000 UNIT/ML IJ SOLN
INTRAMUSCULAR | Status: AC
  Filled 2018-06-08: qty 1

## 2018-06-08 MED ORDER — LOSARTAN POTASSIUM 25 MG PO TABS
25.0000 mg | ORAL_TABLET | Freq: Every day | ORAL | Status: DC
  Administered 2018-06-09: 25 mg via ORAL
  Filled 2018-06-08: qty 1

## 2018-06-08 MED ORDER — SODIUM CHLORIDE 0.9% FLUSH: 3.0000 mL | INTRAVENOUS | Status: DC | PRN

## 2018-06-08 MED ORDER — ACETAMINOPHEN 325 MG PO TABS
ORAL_TABLET | ORAL | Status: AC
  Filled 2018-06-08: qty 2

## 2018-06-08 MED ORDER — DILTIAZEM HCL ER COATED BEADS 180 MG PO CP24
180.0000 mg | ORAL_CAPSULE | Freq: Every morning | ORAL | Status: DC
  Administered 2018-06-09: 180 mg via ORAL
  Filled 2018-06-08: qty 1

## 2018-06-08 MED ORDER — LACTATED RINGERS IV SOLN
INTRAVENOUS | Status: DC | PRN
  Administered 2018-06-08: 08:00:00 via INTRAVENOUS

## 2018-06-08 MED ORDER — POTASSIUM CHLORIDE CRYS ER 20 MEQ PO TBCR
20.0000 meq | EXTENDED_RELEASE_TABLET | Freq: Every day | ORAL | Status: DC
  Administered 2018-06-09: 20 meq via ORAL
  Filled 2018-06-08: qty 1

## 2018-06-08 MED ORDER — APIXABAN 5 MG PO TABS
5.0000 mg | ORAL_TABLET | Freq: Two times a day (BID) | ORAL | Status: DC
  Administered 2018-06-08 – 2018-06-09 (×2): 5 mg via ORAL
  Filled 2018-06-08 (×3): qty 1

## 2018-06-08 MED ORDER — ISOPROTERENOL HCL 0.2 MG/ML IJ SOLN
INTRAVENOUS | Status: DC | PRN
  Administered 2018-06-08: 10 ug/min via INTRAVENOUS

## 2018-06-08 MED ORDER — ALBUTEROL SULFATE (2.5 MG/3ML) 0.083% IN NEBU: 3.0000 mL | INHALATION_SOLUTION | Freq: Four times a day (QID) | RESPIRATORY_TRACT | Status: DC | PRN

## 2018-06-08 MED ORDER — HEPARIN SODIUM (PORCINE) 1000 UNIT/ML IJ SOLN
INTRAMUSCULAR | Status: DC | PRN
  Administered 2018-06-08 (×2): 1000 [IU] via INTRAVENOUS
  Administered 2018-06-08: 12000 [IU] via INTRAVENOUS

## 2018-06-08 MED ORDER — ROCURONIUM BROMIDE 100 MG/10ML IV SOLN: INTRAVENOUS | Status: DC | PRN

## 2018-06-08 MED ORDER — ONDANSETRON HCL 4 MG/2ML IJ SOLN: 4.0000 mg | Freq: Four times a day (QID) | INTRAMUSCULAR | Status: DC | PRN

## 2018-06-08 MED ORDER — BUMETANIDE 1 MG PO TABS
1.0000 mg | ORAL_TABLET | Freq: Every day | ORAL | Status: DC
  Administered 2018-06-09: 1 mg via ORAL
  Filled 2018-06-08: qty 1

## 2018-06-08 MED ORDER — TAMSULOSIN HCL 0.4 MG PO CAPS
0.8000 mg | ORAL_CAPSULE | Freq: Every day | ORAL | Status: DC
  Administered 2018-06-08: 0.8 mg via ORAL
  Filled 2018-06-08: qty 2

## 2018-06-08 MED ORDER — ROCURONIUM BROMIDE 10 MG/ML (PF) SYRINGE
PREFILLED_SYRINGE | INTRAVENOUS | Status: DC | PRN
  Administered 2018-06-08 (×2): 10 mg via INTRAVENOUS
  Administered 2018-06-08: 50 mg via INTRAVENOUS
  Administered 2018-06-08: 30 mg via INTRAVENOUS

## 2018-06-08 MED ORDER — ONDANSETRON HCL 4 MG/2ML IJ SOLN
INTRAMUSCULAR | Status: DC | PRN
  Administered 2018-06-08: 4 mg via INTRAVENOUS

## 2018-06-08 MED ORDER — LIDOCAINE 2% (20 MG/ML) 5 ML SYRINGE
INTRAMUSCULAR | Status: DC | PRN
  Administered 2018-06-08: 100 mg via INTRAVENOUS
  Administered 2018-06-08: 80 mg via INTRAVENOUS

## 2018-06-08 MED ORDER — SODIUM CHLORIDE 0.9% FLUSH
3.0000 mL | Freq: Two times a day (BID) | INTRAVENOUS | Status: DC
  Administered 2018-06-08: 3 mL via INTRAVENOUS

## 2018-06-08 MED ORDER — SUGAMMADEX SODIUM 200 MG/2ML IV SOLN
INTRAVENOUS | Status: DC | PRN
  Administered 2018-06-08: 300 mg via INTRAVENOUS

## 2018-06-08 MED ORDER — DEXAMETHASONE SODIUM PHOSPHATE 10 MG/ML IJ SOLN
INTRAMUSCULAR | Status: DC | PRN
  Administered 2018-06-08: 8 mg via INTRAVENOUS

## 2018-06-08 MED ORDER — HEPARIN (PORCINE) IN NACL 1000-0.9 UT/500ML-% IV SOLN
INTRAVENOUS | Status: AC
  Filled 2018-06-08: qty 500

## 2018-06-08 MED ORDER — BUPROPION HCL ER (XL) 150 MG PO TB24
300.0000 mg | ORAL_TABLET | Freq: Every morning | ORAL | Status: DC
  Administered 2018-06-09: 300 mg via ORAL
  Filled 2018-06-08: qty 2

## 2018-06-08 MED ORDER — ACETAMINOPHEN 325 MG PO TABS
650.0000 mg | ORAL_TABLET | ORAL | Status: DC | PRN
  Administered 2018-06-08: 650 mg via ORAL

## 2018-06-08 MED ORDER — HEPARIN (PORCINE) IN NACL 1000-0.9 UT/500ML-% IV SOLN
INTRAVENOUS | Status: DC | PRN
  Administered 2018-06-08: 500 mL

## 2018-06-08 MED ORDER — HYDROCODONE-ACETAMINOPHEN 5-325 MG PO TABS: 1.0000 | ORAL_TABLET | ORAL | Status: DC | PRN

## 2018-06-08 MED ORDER — BUPIVACAINE HCL (PF) 0.25 % IJ SOLN
INTRAMUSCULAR | Status: AC
  Filled 2018-06-08: qty 30

## 2018-06-08 MED ORDER — MIDAZOLAM HCL 5 MG/5ML IJ SOLN
INTRAMUSCULAR | Status: DC | PRN
  Administered 2018-06-08: 2 mg via INTRAVENOUS

## 2018-06-08 MED ORDER — FENTANYL CITRATE (PF) 100 MCG/2ML IJ SOLN
INTRAMUSCULAR | Status: DC | PRN
  Administered 2018-06-08: 50 ug via INTRAVENOUS

## 2018-06-08 MED ORDER — AMIODARONE HCL 200 MG PO TABS
200.0000 mg | ORAL_TABLET | Freq: Every day | ORAL | Status: DC
  Administered 2018-06-09: 200 mg via ORAL
  Filled 2018-06-08: qty 1

## 2018-06-08 MED ORDER — ESCITALOPRAM OXALATE 10 MG PO TABS
10.0000 mg | ORAL_TABLET | Freq: Every day | ORAL | Status: DC
  Administered 2018-06-08 – 2018-06-09 (×2): 10 mg via ORAL
  Filled 2018-06-08 (×2): qty 1

## 2018-06-08 MED ORDER — PROPOFOL 10 MG/ML IV BOLUS
INTRAVENOUS | Status: DC | PRN
  Administered 2018-06-08: 100 mg via INTRAVENOUS
  Administered 2018-06-08 (×2): 20 mg via INTRAVENOUS

## 2018-06-08 MED ORDER — BUPIVACAINE HCL (PF) 0.25 % IJ SOLN
INTRAMUSCULAR | Status: DC | PRN
  Administered 2018-06-08: 20 mL

## 2018-06-08 MED ORDER — HEPARIN SODIUM (PORCINE) 1000 UNIT/ML IJ SOLN
INTRAMUSCULAR | Status: DC | PRN
  Administered 2018-06-08: 5000 [IU] via INTRAVENOUS
  Administered 2018-06-08: 4000 [IU] via INTRAVENOUS

## 2018-06-08 MED ORDER — ISOPROTERENOL HCL 0.2 MG/ML IJ SOLN
INTRAMUSCULAR | Status: AC
  Filled 2018-06-08: qty 5

## 2018-06-08 SURGICAL SUPPLY — 19 items
BLANKET WARM UNDERBOD FULL ACC (MISCELLANEOUS) ×1 IMPLANT
CATH MAPPNG PENTARAY F 2-6-2MM (CATHETERS) IMPLANT
CATH NAVISTAR SMARTTOUCH DF (ABLATOR) ×1 IMPLANT
CATH SOUNDSTAR 3D IMAGING (CATHETERS) ×1 IMPLANT
CATH WEBSTER BI DIR CS D-F CRV (CATHETERS) ×1 IMPLANT
COVER SWIFTLINK CONNECTOR (BAG) ×1 IMPLANT
HOVERMATT SINGLE USE (MISCELLANEOUS) ×1 IMPLANT
NDL BAYLIS TRANSSEPTAL 71CM (NEEDLE) IMPLANT
NEEDLE BAYLIS TRANSSEPTAL 71CM (NEEDLE) ×2 IMPLANT
PACK EP LATEX FREE (CUSTOM PROCEDURE TRAY) ×2
PACK EP LF (CUSTOM PROCEDURE TRAY) ×1 IMPLANT
PAD PRO RADIOLUCENT 2001M-C (PAD) ×2 IMPLANT
PATCH CARTO3 (PAD) ×1 IMPLANT
PENTARAY F 2-6-2MM (CATHETERS) ×2
SHEATH AVANTI 11F 11CM (SHEATH) ×1 IMPLANT
SHEATH PINNACLE 7F 10CM (SHEATH) ×2 IMPLANT
SHEATH PINNACLE 9F 10CM (SHEATH) ×1 IMPLANT
SHEATH SWARTZ TS SL2 63CM 8.5F (SHEATH) ×1 IMPLANT
TUBING SMART ABLATE COOLFLOW (TUBING) ×3 IMPLANT

## 2018-06-08 NOTE — Anesthesia Postprocedure Evaluation (Signed)
Anesthesia Post Note  Patient: Eric Mayer  Procedure(s) Performed: ATRIAL FIBRILLATION ABLATION (N/A )     Patient location during evaluation: PACU Anesthesia Type: General Level of consciousness: awake and alert Pain management: pain level controlled Vital Signs Assessment: post-procedure vital signs reviewed and stable Respiratory status: spontaneous breathing, nonlabored ventilation, respiratory function stable and patient connected to nasal cannula oxygen Cardiovascular status: blood pressure returned to baseline and stable Postop Assessment: no apparent nausea or vomiting Anesthetic complications: no    Last Vitals:  Vitals:   06/08/18 1305 06/08/18 1310  BP: (!) 154/68 (!) 143/68  Pulse: 65 65  Resp: 18 12  Temp:    SpO2: 99% 99%    L         Effie Berkshire

## 2018-06-08 NOTE — Interval H&P Note (Signed)
History and Physical Interval Note:  06/08/2018 7:32 AM  Eric Mayer  has presented today for surgery, with the diagnosis of a-fib  The various methods of treatment have been discussed with the patient and family. After consideration of risks, benefits and other options for treatment, the patient has consented to  Procedure(s): ATRIAL FIBRILLATION ABLATION (N/A) as a surgical intervention .  The patient's history has been reviewed, patient examined, no change in status, stable for surgery.  I have reviewed the patient's chart and labs.  Questions were answered to the patient's satisfaction.    Cardiac CT reviewed with patient at length today.  Thompson Grayer MD, [facility] 06/08/2018 7:33 AM    Thompson Grayer

## 2018-06-08 NOTE — Discharge Summary (Addendum)
ELECTROPHYSIOLOGY PROCEDURE DISCHARGE SUMMARY    Patient ID: Eric Mayer,  MRN: [id number], DOB/AGE: 01/29/1941 77 y.o.  Admit date: 06/08/2018 Discharge date: 06/09/18  Primary Care Physician: [name], MD  Primary Cardiologist: Dr. Oval Linsey Electrophysiologist: Thompson Grayer, MD  Primary Discharge Diagnosis:  1. Persistent AFib, atrial flutter     CHA2DS2Vasc is 4, on Eliquis, appropriately dosed  Secondary Discharge Diagnosis:  1. Hx of gastric bypass 2. HTN 3. OSA on CPAP  Procedures This Admission:  1.  Electrophysiology study and radiofrequency catheter ablation on 06/08/18 by Dr Thompson Grayer.  This study demonstrated:  CONCLUSIONS: 1. Sinus rhythm upon presentation.   2. Intracardiac echo reveals a moderately enlarged left atrium with four separate pulmonary veins without evidence of pulmonary vein stenosis. 3. Successful electrical isolation and anatomical encircling of all four pulmonary veins with radiofrequency current.    4. Cavo-tricuspid isthmus ablation was performed with complete bidirectional isthmus block achieved.  5. No inducible arrhythmias following ablation both on and off of Isuprel 6. No early apparent complications.   Brief HPI: Eric Mayer is a 77 y.o. male with a history of persistent atrial fibrillation and atrial flutter.  He has failed medical therapy with amiodarone. Risks, benefits, and alternatives to catheter ablation of atrial fibrillation were reviewed with the patient who wished to proceed.  The patient underwent cardiac CT prior to the procedure which demonstrated no LAA thrombus.    Hospital Course:  The patient was admitted and underwent EPS/RFCA of atrial fibrillation with details as outlined above.  He was monitored on telemetry overnight which demonstrated SR.  R groin was without complication on the day of discharge.  The patient feels very well this morning, no CP or SOB, no procedure site discomfort, the patient was  examined by Dr. Rayann Heman and considered to be stable for discharge.  Wound care and restrictions were reviewed with the patient.  The patient will be seen back by Roderic Palau, NP in 4 weeks and Dr Rayann Heman in 12 weeks for post ablation follow up.     Physical Exam: Vitals:   06/08/18 1500 06/08/18 1533 06/08/18 2100 06/09/18 0630  BP: (!) 130/54 138/64 (!) 136/58 (!) 134/51  Pulse: 66 67 75 74  Resp: 12 17 18  (!) 24  Temp:  (!) 97.4 F (36.3 C) 97.6 F (36.4 C) 97.7 F (36.5 C)  TempSrc:  Oral Oral Oral  SpO2: 96% 98% 97% 96%  Weight:    135.5 kg  Height:        GEN- The patient is well appearing, alert and oriented x 3 today.   HEENT: normocephalic, atraumatic; sclera clear, conjunctiva pink; hearing intact; oropharynx clear; neck supple  Lungs- CTA b/l, normal work of breathing.  No wheezes, rales, rhonchi Heart- RRR, no murmurs, rubs or gallops  GI- soft, non-tender, non-distended, obese Extremities- no clubbing, cyanosis, or edema;   R DP/PT pulses 2+ bilaterally,  R groin without hematoma/bruit MS- no significant deformity or atrophy Skin- warm and dry, no rash or lesion Psych- euthymic mood, full affect Neuro- strength and sensation are intact   Labs:   Lab Results  Component Value Date   WBC 9.5 05/17/2018   HGB 12.4 (L) 05/17/2018   HCT 38.3 05/17/2018   MCV 90 05/17/2018   PLT 218 05/17/2018   No results for input(s): NA, K, CL, CO2, BUN, CREATININE, CALCIUM, PROT, BILITOT, ALKPHOS, ALT, AST, GLUCOSE in the last 168 hours.  Invalid input(s): LABALBU   Discharge  Medications:  Allergies as of 06/09/2018      Reactions   Ace Inhibitors Cough   Xarelto [rivaroxaban] Other (See Comments)   Joint pain      Medication List    TAKE these medications   acetaminophen 325 MG tablet Commonly known as:  TYLENOL Take 2 tablets (650 mg total) by mouth every 4 (four) hours as needed for headache or mild pain.   albuterol 108 (90 Base) MCG/ACT inhaler Commonly  known as:  PROVENTIL HFA;VENTOLIN HFA Inhale 2 puffs into the lungs every 6 (six) hours as needed for wheezing or shortness of breath.   amiodarone 200 MG tablet Commonly known as:  PACERONE Take 1 tablet (200 mg total) by mouth daily. What changed:  when to take this   bumetanide 1 MG tablet Commonly known as:  BUMEX Take 1 tablet (1 mg total) by mouth daily.   buPROPion 300 MG 24 hr tablet Commonly known as:  WELLBUTRIN XL Take 300 mg by mouth every morning.   clobetasol cream 0.05 % Commonly known as:  TEMOVATE Apply 1 application topically daily as needed (rash).   CoQ10 100 MG Caps Take 1 capsule by mouth daily.   cyanocobalamin 1000 MCG/ML injection Commonly known as:  (VITAMIN B-12) Inject 1,000 mcg into the muscle every 30 (thirty) days.   diltiazem 180 MG 24 hr capsule Commonly known as:  TIAZAC Take 1 capsule (180 mg total) by mouth every morning.   ELIQUIS 5 MG Tabs tablet Generic drug:  apixaban TAKE 1 TABLET BY MOUTH 2 TIMES DAILY. What changed:  how much to take   escitalopram 10 MG tablet Commonly known as:  LEXAPRO Take 10 mg by mouth daily.   losartan 25 MG tablet Commonly known as:  COZAAR Take 1 tablet (25 mg total) by mouth daily.   magnesium oxide 400 (241.3 Mg) MG tablet Commonly known as:  MAG-OX 2 tablets in the morning and 1 tablet in the evening What changed:    how much to take  how to take this  when to take this  additional instructions   metolazone 2.5 MG tablet Commonly known as:  ZAROXOLYN Take 2.5 mg by mouth daily.   metoprolol tartrate 50 MG tablet Commonly known as:  LOPRESSOR Take 1 tablet (50 mg total) by mouth 2 (two) times daily.   pantoprazole 40 MG tablet Commonly known as:  PROTONIX Take 1 tablet (40 mg total) by mouth daily.   potassium chloride SA 20 MEQ tablet Commonly known as:  K-DUR,KLOR-CON Take 1 tablet (20 mEq total) by mouth daily.   SYSTANE ULTRA 0.4-0.3 % Soln Generic drug:  Polyethyl  Glycol-Propyl Glycol Place 1 drop into both eyes daily as needed (for dry eyes).   tamsulosin 0.4 MG Caps capsule Commonly known as:  FLOMAX Take 0.8 mg by mouth at bedtime.       Disposition:  Home  Discharge Instructions    Diet - low sodium heart healthy   Complete by:  As directed    Diet - low sodium heart healthy   Complete by:  As directed    Increase activity slowly   Complete by:  As directed    Increase activity slowly   Complete by:  As directed      Follow-up Information    [facility] Follow up on 07/10/2018.   Specialty:  Cardiology Why:  8:30AM Contact information: [address] [id number] [place] [phone]  Thompson Grayer, MD Follow up on 09/13/2018.   Specialty:  Cardiology Why:  12:00PM (noon) Contact information: [place] [address] [phone]           Duration of Discharge Encounter: Greater than 30 minutes including physician time.  Signed, Tommye Standard, PA-C 06/09/2018 10:33 AM  I have seen, examined the patient, and reviewed the above assessment and plan.  Changes to above are made where necessary.  On exam, RRR.  Doing well s/p ablation.  DC to home with close outpatient follow-up.  Co Sign: Thompson Grayer, MD 06/09/2018 10:02 PM

## 2018-06-08 NOTE — Anesthesia Procedure Notes (Signed)
Procedure Name: Intubation Date/Time: 06/08/2018 8:01 AM Performed by: Milford Cage, CRNA Pre-anesthesia Checklist: Patient identified, Emergency Drugs available, Suction available and Patient being monitored Patient Re-evaluated:Patient Re-evaluated prior to induction Oxygen Delivery Method: Circle System Utilized Preoxygenation: Pre-oxygenation with 100% oxygen Induction Type: IV induction Ventilation: Mask ventilation without difficulty Laryngoscope Size: Mac and 4 Grade View: Grade I Tube type: Oral Tube size: 7.5 mm Number of attempts: 1 Airway Equipment and Method: Stylet Placement Confirmation: ETT inserted through vocal cords under direct vision,  positive ETCO2 and breath sounds checked- equal and bilateral Secured at: 24 cm Tube secured with: Tape Dental Injury: Teeth and Oropharynx as per pre-operative assessment

## 2018-06-08 NOTE — Transfer of Care (Signed)
Immediate Anesthesia Transfer of Care Note  Patient: Eric Mayer  Procedure(s) Performed: ATRIAL FIBRILLATION ABLATION (N/A )  Patient Location: Cath Lab  Anesthesia Type:General  Level of Consciousness: drowsy and patient cooperative  Airway & Oxygen Therapy: Patient Spontanous Breathing and Patient connected to face mask  Post-op Assessment: Report given to RN and Post -op Vital signs reviewed and stable  Post vital signs: Reviewed and stable  Last Vitals:  Vitals Value Taken Time  BP 134/57 06/08/2018 11:05 AM  Temp 36.2 C 06/08/2018 10:56 AM  Pulse 58 06/08/2018 11:09 AM  Resp 27 06/08/2018 11:09 AM  SpO2 96 % 06/08/2018 11:09 AM  Vitals shown include unvalidated device data.  Last Pain:  Vitals:   06/08/18 1056  TempSrc: Temporal  PainSc: 0-No pain         Complications: No apparent anesthesia complications

## 2018-06-08 NOTE — Anesthesia Preprocedure Evaluation (Addendum)
Anesthesia Evaluation  Patient identified by MRN, date of birth, ID band Patient awake    Reviewed: Allergy & Precautions, NPO status , Patient's Chart, lab work & pertinent test results  Airway Mallampati: II  TM Distance: >3 FB Neck ROM: Full    Dental  (+) Teeth Intact, Dental Advisory Given   Pulmonary asthma , sleep apnea and Continuous Positive Airway Pressure Ventilation ,    breath sounds clear to auscultation       Cardiovascular hypertension, +CHF  + dysrhythmias Atrial Fibrillation  Rhythm:Irregular Rate:Normal     Neuro/Psych Depression    GI/Hepatic negative GI ROS, Neg liver ROS,   Endo/Other  negative endocrine ROS  Renal/GU negative Renal ROS     Musculoskeletal  (+) Arthritis ,   Abdominal (+) + obese,   Peds  Hematology   Anesthesia Other Findings   Reproductive/Obstetrics                           Lab Results  Component Value Date   WBC 9.5 05/17/2018   HGB 12.4 (L) 05/17/2018   HCT 38.3 05/17/2018   MCV 90 05/17/2018   PLT 218 05/17/2018   Lab Results  Component Value Date   CREATININE 1.13 05/17/2018   BUN 41 (H) 05/17/2018   NA 142 05/17/2018   K 4.7 05/17/2018   CL 98 05/17/2018   CO2 30 (H) 05/17/2018     Myocardial Perfusion:  The left ventricular ejection fraction is normal (55-65%).  Nuclear stress EF: 60%.  There was no ST segment deviation noted during stress.  The study is normal.  This is a low risk study.     Anesthesia Physical Anesthesia Plan  ASA: III  Anesthesia Plan: General   Post-op Pain Management:    Induction: Intravenous  PONV Risk Score and Plan: 3 and 2 and Ondansetron, Dexamethasone and Treatment may vary due to age or medical condition  Airway Management Planned: Oral ETT  Additional Equipment: None  Intra-op Plan:   Post-operative Plan: Extubation in OR  Informed Consent: I have reviewed the patients  History and Physical, chart, labs and discussed the procedure including the risks, benefits and alternatives for the proposed anesthesia with the patient or authorized representative who has indicated his/her understanding and acceptance.   Dental advisory given  Plan Discussed with: CRNA  Anesthesia Plan Comments:        Anesthesia Quick Evaluation

## 2018-06-09 ENCOUNTER — Encounter (HOSPITAL_COMMUNITY): Payer: Self-pay | Admitting: Internal Medicine

## 2018-06-09 DIAGNOSIS — I483 Typical atrial flutter: Secondary | ICD-10-CM | POA: Diagnosis not present

## 2018-06-09 DIAGNOSIS — I4819 Other persistent atrial fibrillation: Secondary | ICD-10-CM

## 2018-06-09 DIAGNOSIS — I5032 Chronic diastolic (congestive) heart failure: Secondary | ICD-10-CM | POA: Diagnosis not present

## 2018-06-09 DIAGNOSIS — I110 Hypertensive heart disease with heart failure: Secondary | ICD-10-CM | POA: Diagnosis not present

## 2018-06-09 MED ORDER — AMIODARONE HCL 200 MG PO TABS: 200.0000 mg | ORAL_TABLET | Freq: Every day | ORAL | 3 refills | Status: DC

## 2018-06-09 MED ORDER — PANTOPRAZOLE SODIUM 40 MG PO TBEC: 40.0000 mg | DELAYED_RELEASE_TABLET | Freq: Every day | ORAL | 0 refills | Status: DC

## 2018-06-09 NOTE — Discharge Instructions (Signed)
Post procedure care instructions No driving for 4 days. No lifting over 5 lbs for 1 week. No vigorous or sexual activity for 1 week. You may return to work on 06/15/18. Keep procedure site clean & dry. If you notice increased pain, swelling, bleeding or pus, call/return!  You may shower, but no soaking baths/hot tubs/pools for 1 week.   You have an appointment set up with the [facility].  Multiple studies have shown that being followed by a dedicated atrial fibrillation clinic in addition to the standard care you receive from your other physicians improves health. We believe that enrollment in the atrial fibrillation clinic will allow [place] to better care for you.   The phone number to the [facility] is [phone]. The clinic is staffed Monday through Friday from 8:30am to 5pm.  Parking Directions: The clinic is located in the Heart and Vascular Building connected to [facility]. 1)From [address] turn on to [address] and go to the 3rd entrance  (Heart and Vascular entrance) on the right. 2)Look to the right for Heart &Vascular Parking Garage. 3)A code for the entrance is required please call the clinic to receive this.   4)Take the elevators to the 1st floor. Registration is in the room with the glass walls at the end of the hallway.  If you have any trouble parking or locating the clinic, please dont hesitate to call [phone].      Information on my medicine - ELIQUIS (apixaban)  Why was Eliquis prescribed for you? Eliquis was prescribed for you to reduce the risk of a blood clot forming that can cause a stroke if you have a medical condition called atrial fibrillation (a type of irregular heartbeat).  What do You need to know about Eliquis ? Take your Eliquis TWICE DAILY - one tablet in the morning and one tablet in the evening with or without food. If you have difficulty swallowing the tablet whole please discuss with your  pharmacist how to take the medication safely.  Take Eliquis exactly as prescribed by your doctor and DO NOT stop taking Eliquis without talking to the doctor who prescribed the medication.  Stopping may increase your risk of developing a stroke.  Refill your prescription before you run out.  After discharge, you should have regular check-up appointments with your healthcare provider that is prescribing your Eliquis.  In the future your dose may need to be changed if your kidney function or weight changes by a significant amount or as you get older.  What do you do if you miss a dose? If you miss a dose, take it as soon as you remember on the same day and resume taking twice daily.  Do not take more than one dose of ELIQUIS at the same time to make up a missed dose.  Important Safety Information A possible side effect of Eliquis is bleeding. You should call your healthcare provider right away if you experience any of the following: ? Bleeding from an injury or your nose that does not stop. ? Unusual colored urine (red or dark brown) or unusual colored stools (red or black). ? Unusual bruising for unknown reasons. ? A serious fall or if you hit your head (even if there is no bleeding).  Some medicines may interact with Eliquis and might increase your risk of bleeding or clotting while on Eliquis. To help avoid this, consult your healthcare provider or pharmacist prior to using any new prescription or non-prescription medications,  including herbals, vitamins, non-steroidal anti-inflammatory drugs (NSAIDs) and supplements.  This website has more information on Eliquis (apixaban): http://www.eliquis.com/eliquis/home

## 2018-06-27 ENCOUNTER — Other Ambulatory Visit: Payer: Self-pay | Admitting: Cardiovascular Disease

## 2018-07-06 ENCOUNTER — Other Ambulatory Visit: Payer: Self-pay | Admitting: Internal Medicine

## 2018-07-06 ENCOUNTER — Ambulatory Visit
Admission: RE | Admit: 2018-07-06 | Discharge: 2018-07-06 | Disposition: A | Discharge status: Home / Self Care | Payer: Medicare Other | Source: Ambulatory Visit | Attending: Internal Medicine | Admitting: Internal Medicine

## 2018-07-06 DIAGNOSIS — M545 Low back pain, unspecified: Secondary | ICD-10-CM

## 2018-07-10 ENCOUNTER — Encounter (HOSPITAL_COMMUNITY): Payer: Self-pay | Admitting: Nurse Practitioner

## 2018-07-10 ENCOUNTER — Ambulatory Visit (HOSPITAL_COMMUNITY)
Admission: RE | Admit: 2018-07-10 | Discharge: 2018-07-10 | Disposition: A | Discharge status: Home / Self Care | Payer: Medicare Other | Source: Ambulatory Visit | Attending: Nurse Practitioner | Admitting: Nurse Practitioner

## 2018-07-10 VITALS — BP 112/64 | HR 99 | Ht 70.0 in | Wt 295.0 lb

## 2018-07-10 DIAGNOSIS — I110 Hypertensive heart disease with heart failure: Secondary | ICD-10-CM | POA: Insufficient documentation

## 2018-07-10 DIAGNOSIS — Z7901 Long term (current) use of anticoagulants: Secondary | ICD-10-CM | POA: Insufficient documentation

## 2018-07-10 DIAGNOSIS — Z96651 Presence of right artificial knee joint: Secondary | ICD-10-CM | POA: Insufficient documentation

## 2018-07-10 DIAGNOSIS — Z8546 Personal history of malignant neoplasm of prostate: Secondary | ICD-10-CM | POA: Insufficient documentation

## 2018-07-10 DIAGNOSIS — I480 Paroxysmal atrial fibrillation: Secondary | ICD-10-CM | POA: Diagnosis not present

## 2018-07-10 DIAGNOSIS — I4891 Unspecified atrial fibrillation: Secondary | ICD-10-CM | POA: Diagnosis not present

## 2018-07-10 DIAGNOSIS — I4892 Unspecified atrial flutter: Secondary | ICD-10-CM | POA: Insufficient documentation

## 2018-07-10 DIAGNOSIS — Z9889 Other specified postprocedural states: Secondary | ICD-10-CM | POA: Insufficient documentation

## 2018-07-10 DIAGNOSIS — I5032 Chronic diastolic (congestive) heart failure: Secondary | ICD-10-CM | POA: Insufficient documentation

## 2018-07-10 DIAGNOSIS — Z79899 Other long term (current) drug therapy: Secondary | ICD-10-CM | POA: Insufficient documentation

## 2018-07-10 DIAGNOSIS — G4733 Obstructive sleep apnea (adult) (pediatric): Secondary | ICD-10-CM | POA: Diagnosis not present

## 2018-07-10 LAB — COMPREHENSIVE METABOLIC PANEL
ALT: 23 U/L (ref 0–44)
AST: 16 U/L (ref 15–41)
Albumin: 3.2 g/dL — ABNORMAL LOW (ref 3.5–5.0)
Alkaline Phosphatase: 104 U/L (ref 38–126)
Anion gap: 6 (ref 5–15)
BUN: 18 mg/dL (ref 8–23)
CO2: 23 mmol/L (ref 22–32)
Calcium: 8.5 mg/dL — ABNORMAL LOW (ref 8.9–10.3)
Chloride: 112 mmol/L — ABNORMAL HIGH (ref 98–111)
Creatinine, Ser: 1.06 mg/dL (ref 0.61–1.24)
GFR calc Af Amer: >60 mL/min (ref >60)
GFR calc non Af Amer: >60 mL/min (ref >60)
Glucose, Bld: 108 mg/dL — ABNORMAL HIGH (ref 70–99)
Potassium: 4 mmol/L (ref 3.5–5.1)
Sodium: 141 mmol/L (ref 135–145)
Total Bilirubin: 1.1 mg/dL (ref 0.3–1.2)
Total Protein: 5.9 g/dL — ABNORMAL LOW (ref 6.5–8.1)

## 2018-07-10 LAB — CBC
HCT: 36.7 % — ABNORMAL LOW (ref 39.0–52.0)
Hemoglobin: 11 g/dL — ABNORMAL LOW (ref 13.0–17.0)
MCH: 28.1 pg (ref 26.0–34.0)
MCHC: 30 g/dL (ref 30.0–36.0)
MCV: 93.9 fL (ref 80.0–100.0)
Platelets: 162 10*3/uL (ref 150–400)
RBC: 3.91 MIL/uL — ABNORMAL LOW (ref 4.22–5.81)
RDW: 15.4 % (ref 11.5–15.5)
WBC: 10.6 10*3/uL — ABNORMAL HIGH (ref 4.0–10.5)
nRBC: 0 % (ref 0.0–0.2)

## 2018-07-10 LAB — TSH: TSH: 2.815 u[IU]/mL (ref 0.350–4.500)

## 2018-07-10 NOTE — Progress Notes (Signed)
Primary Care Physician: [name], MD Referring Physician: Dr. Aretta Nip is a 77 y.o. male with a h/o paroxysmal afib and flutter that is in the afib clinic for one month f/u ablation. He has noted the last few days that he has not felt as good. More exertional dyspnea with exertion but also with increased back pain form his arthritis. His apple watch strips are hard to interpret with a lot of artifact.    He has continued on amiodarone 200 mg bid increased on office visit with Dt. Oval Linsey 9/24. Will lower back to 200 mg qd and get amio labs today. No swallowing or groin issues.  Today, he denies symptoms of palpitations, chest pain, shortness of breath, orthopnea, PND, lower extremity edema, dizziness, presyncope, syncope, or neurologic sequela. The patient is tolerating medications without difficulties and is otherwise without complaint today.   Past Medical History:  Diagnosis Date  . Anemia 1980s X 1  . Arthritis    "hands, knees, hips, ankles" (07/31/2015)  . Atrial flutter ([place]) 07/31/2015  . Chronic diastolic heart failure ([place])    a. 11/2015: Echo w/ EF of 60-65%, no WMA, Grade 2 DD, trivial AR, ascending aorta mildly dilated.   . Depression   . History of cardiovascular stress test    a. 03/2015: Dobutamine Stress Echo with no evidence of ischemia.   [name] History of gastric bypass   . History of peptic ulcer    1980's  . History of radiation therapy 1993   sarcoma of left groin, tx at [facility]  . History of sarcoma    1993  LEFT GOIN--  S/P SURGERY, RADIATION AND CHEMO IN [place]  . Hypertension   . Hypogonadism in male   . Incomplete right bundle branch block   . 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 Nocturia   . OSA on CPAP   . Persistent atrial fibrillation       . 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 PVC's  (premature ventricular contractions) 11/21/2015  . 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 Weakness of left leg    SECONDARY TO SURGICAL NERVE INJURY OF LEFT GOIN  . Wears glasses    Past Surgical History:  Procedure Laterality Date  . ATRIAL FIBRILLATION ABLATION N/A 06/08/2018   Procedure: ATRIAL FIBRILLATION ABLATION;  Surgeon: Thompson Grayer, MD;  Location: [place] [facility];  Service: Cardiovascular;  Laterality: N/A;  . CARDIAC CATHETERIZATION  08-12-2003  dr Tressia Miners turner   Normal coronary arteries, normal wall motion, no sig. abnormalities  . CARDIOVERSION N/A 08/04/2015   Procedure: CARDIOVERSION;  Surgeon: [name], MD;  Location: [place];  Service: Cardiovascular;  Laterality: N/A;  . CARDIOVERSION N/A 10/28/2017   Procedure: CARDIOVERSION;  Surgeon: Larey Dresser, MD;  Location: [facility] ENDOSCOPY;  Service: Cardiovascular;  Laterality: N/A;  . CARDIOVERSION N/A 04/12/2018   Procedure: CARDIOVERSION;  Surgeon: Thayer Headings, MD;  Location: [facility] ENDOSCOPY;  Service: Cardiovascular;  Laterality: N/A;  . COLONOSCOPY  07-03-2002  . [place]  . FRACTURE SURGERY    . GROIN DISSECTION Left 1992   [place]   SARCOMA SURGERY  . INGUINAL HERNIA REPAIR Right 1950s?  . INTESTINAL BYPASS  1976   GASTRIC FOR OBESITY  . JOINT REPLACEMENT    .  PATELLA FRACTURE SURGERY Left ~ 1995   "broke it twice; only had OR once"  . PROSTATE BIOPSY  06/2014  . RADIOACTIVE SEED IMPLANT N/A 10/17/2014   Procedure: RADIOACTIVE SEED IMPLANT    ;  Surgeon: Ailene Rud, MD;  Location: [facility];  Service: Urology;  Laterality: N/A;   68 SEEDS IMPLANTED   . TEE WITHOUT CARDIOVERSION N/A 10/28/2017   Procedure: TRANSESOPHAGEAL ECHOCARDIOGRAM (TEE);  Surgeon: Larey Dresser, MD;  Location: [facility] ENDOSCOPY;  Service: Cardiovascular;  Laterality: N/A;  . TONSILLECTOMY  1950s  . TOTAL KNEE ARTHROPLASTY Right 12/17/2014   Procedure: TOTAL KNEE ARTHROPLASTY;   Surgeon: Melrose Nakayama, MD;  Location: [place];  Service: Orthopedics;  Laterality: Right;  . TRANSTHORACIC ECHOCARDIOGRAM  08-08-2003   moderate LVH/  ef 55-65%/  mild MR/  moderate LAE/  trivial TR/  trivial pericardial effusion posterior to the heart  . [place]  . VIDEO BRONCHOSCOPY Bilateral 09/21/2016   Procedure: VIDEO BRONCHOSCOPY WITHOUT FLUORO;  Surgeon: Collene Gobble, MD;  Location: [place];  Service: Cardiopulmonary;  Laterality: Bilateral;    Current Outpatient Medications  Medication Sig Dispense Refill  . acetaminophen (TYLENOL) 325 MG tablet Take 2 tablets (650 mg total) by mouth every 4 (four) hours as needed for headache or mild pain.    [name] albuterol (PROVENTIL HFA;VENTOLIN HFA) 108 (90 Base) MCG/ACT inhaler Inhale 2 puffs into the lungs every 6 (six) hours as needed for wheezing or shortness of breath.    [name] amiodarone (PACERONE) 200 MG tablet Take 200 mg by mouth daily.    . bumetanide (BUMEX) 1 MG tablet Take 1 tablet (1 mg total) by mouth daily. 90 tablet 3  . buPROPion (WELLBUTRIN XL) 300 MG 24 hr tablet Take 300 mg by mouth every morning.    . Coenzyme Q10 (COQ10) 100 MG CAPS Take 1 capsule by mouth daily.    . cyanocobalamin (,VITAMIN B-12,) 1000 MCG/ML injection Inject 1,000 mcg into the muscle every 30 (thirty) days.    [name] diltiazem (TIAZAC) 180 MG 24 hr capsule Take 1 capsule (180 mg total) by mouth every morning. 14 capsule 1  . ELIQUIS 5 MG TABS tablet TAKE 1 TABLET BY MOUTH 2 TIMES DAILY. (Patient taking differently: Take 5 mg by mouth 2 (two) times daily. ) 180 tablet 1  . escitalopram (LEXAPRO) 10 MG tablet Take 10 mg by mouth daily.    [name] losartan (COZAAR) 25 MG tablet Take 1 tablet (25 mg total) by mouth daily. 90 tablet 3  . magnesium oxide (MAG-OX) 400 MG tablet Take 400 mg by mouth daily.    . metolazone (ZAROXOLYN) 2.5 MG tablet Take 2.5 mg by mouth as needed.     . metoprolol tartrate (LOPRESSOR) 50 MG tablet Take 1 tablet (50 mg  total) by mouth 2 (two) times daily. 180 tablet 3  . pantoprazole (PROTONIX) 40 MG tablet Take 1 tablet (40 mg total) by mouth daily. 45 tablet 0  . Polyethyl Glycol-Propyl Glycol (SYSTANE ULTRA) 0.4-0.3 % SOLN Place 1 drop into both eyes daily as needed (for dry eyes).    . potassium chloride SA (K-DUR,KLOR-CON) 20 MEQ tablet Take 1 tablet (20 mEq total) by mouth daily. 90 tablet 3  . tamsulosin (FLOMAX) 0.4 MG CAPS capsule Take 0.8 mg by mouth at bedtime.      No current facility-administered medications for this encounter.     Allergies  Allergen Reactions  . Ace Inhibitors Cough  .  Xarelto [Rivaroxaban] Other (See Comments)    Joint pain    Social History   Socioeconomic History  . Marital status: Married    Spouse name: Not on file  . Number of children: Not on file  . Years of education: Not on file  . Highest education level: Not on file  Occupational History  . Not on file  Social Needs  . Financial resource strain: Not on file  . Food insecurity:    Worry: Not on file    Inability: Not on file  . Transportation needs:    Medical: Not on file    Non-medical: Not on file  Tobacco Use  . Smoking status: Never Smoker  . Smokeless tobacco: Never Used  Substance and Sexual Activity  . Alcohol use: No  . Drug use: No  . Sexual activity: Not Currently  Lifestyle  . Physical activity:    Days per week: Not on file    Minutes per session: Not on file  . Stress: Not on file  Relationships  . Social connections:    Talks on phone: Not on file    Gets together: Not on file    Attends religious service: Not on file    Active member of club or organization: Not on file    Attends meetings of clubs or organizations: Not on file    Relationship status: Not on file  . Intimate partner violence:    Fear of current or ex partner: Not on file    Emotionally abused: Not on file    Physically abused: Not on file    Forced sexual activity: Not on file  Other Topics Concern   . 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 Liver disease Mother   . Renal Disease Mother   . Stroke Mother   . Atrial fibrillation Father   . Cancer Sister     ROS- All systems are reviewed and negative except as per the HPI above  Physical Exam: Vitals:   07/10/18 0847  BP: 112/64  Pulse: 99  Weight: 133.8 kg  Height: 5\' 10"  (1.778 m)   Wt Readings from Last 3 Encounters:  07/10/18 133.8 kg  06/09/18 135.5 kg  05/17/18 129.3 kg    Labs: Lab Results  Component Value Date   NA 142 05/17/2018   K 4.7 05/17/2018   CL 98 05/17/2018   CO2 30 (H) 05/17/2018   GLUCOSE 101 (H) 05/17/2018   BUN 41 (H) 05/17/2018   CREATININE 1.13 05/17/2018   CALCIUM 9.5 05/17/2018   MG 1.5 (L) 05/09/2018   Lab Results  Component Value Date   INR 1.32 08/03/2015   Lab Results  Component Value Date   CHOL 167 05/09/2018   HDL 46 05/09/2018   LDLCALC 88 05/09/2018   TRIG 164 (H) 05/09/2018     GEN- The patient is well appearing, alert and oriented x 3 today.   Head- normocephalic, atraumatic Eyes-  Sclera clear, conjunctiva pink Ears- hearing intact Oropharynx- clear Neck- supple, no JVP Lymph- no cervical lymphadenopathy Lungs- Clear to ausculation bilaterally, normal work of breathing Heart- irregular rate and rhythm, no murmurs, rubs or gallops, PMI not laterally displaced GI- soft, NT, ND, + BS Extremities- no clubbing, cyanosis, or edema MS- no significant deformity or atrophy Skin- no rash  or lesion Psych- euthymic mood, full affect Neuro- strength and sensation are intact  EKG-undetermined rhythm at 99 bpm, but I feel it is afib/flutter     Assessment and Plan: 1. Afib/flutter  One month s/p ablation Pt had been feeling improved until the last few days but is also having increased back pain so he is unclear which is  contributing  It is hard to interpret his apple watch strips He will start running strips daily to see if persistent If so, he will benefit from an cardioversion He has f/u with Dr. Oval Linsey next Wednesday and can review strips with her then and determine if cardioversion in in order He has been taking amiodarone 200 mg bid since mid September and will lower dose to 200 mg daily Continue eliqus 5 mg bid for chadsvasc score of at least 4, reminded not to interrupt for the 3 month recovery period Cmet/tsh/cbc  today   Eric Mayer, [facility] [address] [place], [place] [address] [phone]

## 2018-07-19 ENCOUNTER — Ambulatory Visit (INDEPENDENT_AMBULATORY_CARE_PROVIDER_SITE_OTHER): Payer: Medicare Other | Admitting: Cardiovascular Disease

## 2018-07-19 ENCOUNTER — Encounter: Payer: Self-pay | Admitting: Cardiovascular Disease

## 2018-07-19 VITALS — BP 141/81 | HR 112 | Ht 70.0 in | Wt 292.4 lb

## 2018-07-19 DIAGNOSIS — I5032 Chronic diastolic (congestive) heart failure: Secondary | ICD-10-CM

## 2018-07-19 DIAGNOSIS — I10 Essential (primary) hypertension: Secondary | ICD-10-CM | POA: Diagnosis not present

## 2018-07-19 DIAGNOSIS — Z01812 Encounter for preprocedural laboratory examination: Secondary | ICD-10-CM

## 2018-07-19 DIAGNOSIS — I4819 Other persistent atrial fibrillation: Secondary | ICD-10-CM

## 2018-07-19 DIAGNOSIS — I480 Paroxysmal atrial fibrillation: Secondary | ICD-10-CM | POA: Diagnosis not present

## 2018-07-19 LAB — CBC WITH DIFFERENTIAL/PLATELET
Basophils Absolute: 0.1 10*3/uL (ref 0.0–0.2)
Basos: 1 %
EOS (ABSOLUTE): 0.2 10*3/uL (ref 0.0–0.4)
Eos: 3 %
Hematocrit: 36.7 % — ABNORMAL LOW (ref 37.5–51.0)
Hemoglobin: 11.5 g/dL — ABNORMAL LOW (ref 13.0–17.7)
Immature Grans (Abs): 0 10*3/uL (ref 0.0–0.1)
Immature Granulocytes: 0 %
Lymphocytes Absolute: 1.9 10*3/uL (ref 0.7–3.1)
Lymphs: 28 %
MCH: 28.2 pg (ref 26.6–33.0)
MCHC: 31.3 g/dL — ABNORMAL LOW (ref 31.5–35.7)
MCV: 90 fL (ref 79–97)
Monocytes Absolute: 0.5 10*3/uL (ref 0.1–0.9)
Monocytes: 7 %
Neutrophils Absolute: 4.2 10*3/uL (ref 1.4–7.0)
Neutrophils: 61 %
Platelets: 229 10*3/uL (ref 150–450)
RBC: 4.08 x10E6/uL — ABNORMAL LOW (ref 4.14–5.80)
RDW: 14.8 % (ref 12.3–15.4)
WBC: 6.8 10*3/uL (ref 3.4–10.8)

## 2018-07-19 LAB — BASIC METABOLIC PANEL
BUN/Creatinine Ratio: 24 (ref 10–24)
BUN: 29 mg/dL — ABNORMAL HIGH (ref 8–27)
CO2: 20 mmol/L (ref 20–29)
Calcium: 9 mg/dL (ref 8.6–10.2)
Chloride: 103 mmol/L (ref 96–106)
Creatinine, Ser: 1.19 mg/dL (ref 0.76–1.27)
GFR calc Af Amer: 69 mL/min/{1.73_m2} (ref >59)
GFR calc non Af Amer: 59 mL/min/{1.73_m2} — ABNORMAL LOW (ref >59)
Glucose: 91 mg/dL (ref 65–99)
Potassium: 4.6 mmol/L (ref 3.5–5.2)
Sodium: 140 mmol/L (ref 134–144)

## 2018-07-19 NOTE — Patient Instructions (Signed)
Medication Instructions:  Your physician recommends that you continue on your current medications as directed. Please refer to the Current Medication list given to you today.  If you need a refill on your cardiac medications before your next appointment, please call your pharmacy.   Lab work: BMET/CBC TODAY   If you have labs (blood work) drawn today and your tests are completely normal, you will receive your results only by: [name] MyChart Message (if you have MyChart) OR . A paper copy in the mail If you have any lab test that is abnormal or we need to change your treatment, we will call you to review the results.  Testing/Procedures: CARDIOVERSION 07/26/18  Follow-Up: At [facility], you and your health needs are our priority.  As part of our continuing mission to provide you with exceptional heart care, we have created designated Provider Care Teams.  These Care Teams include your primary Cardiologist (physician) and Advanced Practice Providers (APPs -  Physician Assistants and Nurse Practitioners) who all work together to provide you with the care you need, when you need it. You will need a follow up appointment in 4 weeks. You may see [name], MD or one of the following Advanced Practice Providers on your designated Care Team:   [name], PA-C Roby Lofts, [place] . Sande Rives, PA-C  You are scheduled for a Cardioversion on 07/26/2018 with Dr. Aundra Dubin.  Please arrive at the [facility] (Main Entrance A) at [facility]: [address] [place], [place] [address] at 1:30 pm. (1 hour prior to procedure unless lab work is needed; if lab work is needed arrive 1.5 hours ahead)  DIET: Nothing to eat or drink after midnight except a sip of water with medications (see medication instructions below)  Medication Instructions: Hold BUMEX AND METOLAZONE MORNING OF PROCEDURE   Continue your anticoagulant: ELIQUIS  You will need to continue your anticoagulant after your  procedure until you are told by your  Provider that it is safe to stop  Labs: BMET/CBC TODAY   You must have a responsible person to drive you home and stay in the waiting area during your procedure. Failure to do so could result in cancellation.  Bring your insurance cards.  *Special Note: Every effort is made to have your procedure done on time. Occasionally there are emergencies that occur at the hospital that may cause delays. Please be patient if a delay does occur.

## 2018-07-19 NOTE — Progress Notes (Signed)
Cardiology Office Note   Date:  07/19/2018   ID:  Eric Mayer, DOB March 17, 1941, MRN [id number]  PCP:  [name], MD  Cardiologist:   [name], MD   No chief complaint on file.    History of Present Illness: Eric Mayer is a 77 y.o. male with HTN, paroxysmal atrial fibrillation/flutter, chronic diastolic heart failure, OSA on CPAP, asthma, BPPV, and prostate adenocarcinoma here for follow up.  Eric Mayer initially presented 03/2015 with dyspnea on exertion and chronic lower extremity edema.  He had a dobutamine stress echo 03/27/15 that was negative for ischemia.  He had an echo 06/2015 that revealed LVEF 60-65% with mild aortic regurgitation and mitral regurgitation.  They were unable to evaluate diastolic function.  He was admitted 07/2015 for acute on chronic diastolic heart failure and diuresed with IV lasix. He also underwent DCCV.  Eric Mayer was seen in clinic 04/2016 and reported fatigue, shortness of breath, weight gain, and low blood pressures.  At that appointment he was switched from Lasix to Bumex and his valsartan was reduced. His weight is labile and fluctuates with dieteary changes.  He has been very responsive to extra doses of Bumex as needed.  Eric Mayer saw [name] 07/21/16 with increased dyspnea on exertion.  He was noted to be back in atrial fibrillation in the setting of not wearing his CPAP for the preceding week.  He was started on amiodarone 200mg  and metoprolol was reduced to 25mg  bid.  He converted back to sinus rhythm and diuresed 10 lb after returning to sinus rhythm.  Given that he was on amiodarone he was referred for PFTs 08/20/16 that revealed severe obstruction and likely moderate restriction.  There is also moderate diffusion defect.  He was referred to pulmonology and saw Dr. Louis Meckel. He underwent bronchoscopy on 09/21/16 which showed no intrathoracic obstruction or tracheal abnormality. He was started on Symbicort.  He was also  referred to atrial fibrillation for consideration of alternatives to amiodarone. At this time there was not felt to be any pharmacologic alternatives to amiodarone. He was thought to be a poor candidate for ablation.    Eric Mayer had recurrent atrial fibrillation 09/2017.  He missed two doses of Eliquis so he couldn't have DCCV without TEE.  Losartan was reduced and metoprolol was increased.  He underwent TEE/DCCV on 10/28/17.    At his follow-up appointment he was back in atrial fibrillation.  He was referred for cardioversion n 04/12/2018 and was successfully converted with 1 shock.  He followed up in the atrial fibrillation clinic 04/19/2018.  Options of washing on amiodarone and switching to dofetilide and ablation were discussed.  Eric Mayer started feeling poorly again on 05/01/18 and was thought to be back in atrial fibrillation.  Cardioversion lasted less than 2 weeks.  He had a [employer] 04/2018 that was negative for ischemia.  LVEF was 60%.  At his last appointment amiodarone was increased to 200 mg twice daily.  He followed up with Dr. Rayann Heman and ultimately underwent ablation on 06/08/2018.  He followed up with Roderic Palau, NP on 11/25 and amiodarone was reduced back to 200mg  daily.  He has been feeling poorly for the last few weeks.  His heart rate increased around this same time.  Lately his heart rate has been in the 110s to 120s.  His weight has started going back down.  He took metolazone and increased bumetanide but only lost a couple pounds.  He is  having to use the walker more because he feels unsteady on his feet.  On review of his apple watch ECG strips it appears that he has been mostly in atrial fibrillation during this time.   Past Medical History:  Diagnosis Date  . Anemia 1980s X 1  . Arthritis    "hands, knees, hips, ankles" (07/31/2015)  . Atrial flutter ([place]) 07/31/2015  . Chronic diastolic heart failure ([place])    a. 11/2015: Echo w/ EF of 60-65%, no WMA, Grade 2 DD,  trivial AR, ascending aorta mildly dilated.   . Depression   . History of cardiovascular stress test    a. 03/2015: Dobutamine Stress Echo with no evidence of ischemia.   [name] History of gastric bypass   . History of peptic ulcer    1980's  . History of radiation therapy 1993   sarcoma of left groin, tx at [facility]  . History of sarcoma    1993  LEFT GOIN--  S/P SURGERY, RADIATION AND CHEMO IN [place]  . Hypertension   . Hypogonadism in male   . Incomplete right bundle branch block   . 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 Nocturia   . OSA on CPAP   . Persistent atrial fibrillation       . Primary prostate adenocarcinoma ([place]) DX 07/08/14   Gleason 7,  stage T1c  . PVC's (premature ventricular contractions) 11/21/2015  . Sarcoma ([place]) ~ 1993/1994   "of groin"  . Weakness of left leg    SECONDARY TO SURGICAL NERVE INJURY OF LEFT GOIN  . Wears glasses     Past Surgical History:  Procedure Laterality Date  . ATRIAL FIBRILLATION ABLATION N/A 06/08/2018   Procedure: ATRIAL FIBRILLATION ABLATION;  Surgeon: Thompson Grayer, MD;  Location: [place] CV LAB;  Service: Cardiovascular;  Laterality: N/A;  . CARDIAC CATHETERIZATION  08-12-2003  dr Tressia Miners turner   Normal coronary arteries, normal wall motion, no sig. abnormalities  . CARDIOVERSION N/A 08/04/2015   Procedure: CARDIOVERSION;  Surgeon: [name], MD;  Location: [place];  Service: Cardiovascular;  Laterality: N/A;  . CARDIOVERSION N/A 10/28/2017   Procedure: CARDIOVERSION;  Surgeon: Larey Dresser, MD;  Location: [facility] ENDOSCOPY;  Service: Cardiovascular;  Laterality: N/A;  . CARDIOVERSION N/A 04/12/2018   Procedure: CARDIOVERSION;  Surgeon: Thayer Headings, MD;  Location: [facility] ENDOSCOPY;  Service: Cardiovascular;  Laterality: N/A;  . COLONOSCOPY  07-03-2002  . [place]  . FRACTURE SURGERY    . GROIN DISSECTION  Left 1992   [place]   SARCOMA SURGERY  . INGUINAL HERNIA REPAIR Right 1950s?  . INTESTINAL BYPASS  1976   GASTRIC FOR OBESITY  . JOINT REPLACEMENT    . PATELLA FRACTURE SURGERY Left ~ 1995   "broke it twice; only had OR once"  . PROSTATE BIOPSY  06/2014  . RADIOACTIVE SEED IMPLANT N/A 10/17/2014   Procedure: RADIOACTIVE SEED IMPLANT    ;  Surgeon: Ailene Rud, MD;  Location: [facility];  Service: Urology;  Laterality: N/A;   68 SEEDS IMPLANTED   . TEE WITHOUT CARDIOVERSION N/A 10/28/2017   Procedure: TRANSESOPHAGEAL ECHOCARDIOGRAM (TEE);  Surgeon: Larey Dresser, MD;  Location: [facility] ENDOSCOPY;  Service: Cardiovascular;  Laterality: N/A;  . TONSILLECTOMY  1950s  . TOTAL KNEE ARTHROPLASTY Right 12/17/2014   Procedure: TOTAL KNEE ARTHROPLASTY;  Surgeon: Melrose Nakayama, MD;  Location: [place];  Service: Orthopedics;  Laterality: Right;  . TRANSTHORACIC ECHOCARDIOGRAM  08-08-2003   moderate LVH/  ef 55-65%/  mild MR/  moderate LAE/  trivial TR/  trivial pericardial effusion posterior to the heart  . [place]  . VIDEO BRONCHOSCOPY Bilateral 09/21/2016   Procedure: VIDEO BRONCHOSCOPY WITHOUT FLUORO;  Surgeon: Collene Gobble, MD;  Location: [place];  Service: Cardiopulmonary;  Laterality: Bilateral;     Current Outpatient Medications  Medication Sig Dispense Refill  . acetaminophen (TYLENOL) 325 MG tablet Take 2 tablets (650 mg total) by mouth every 4 (four) hours as needed for headache or mild pain.    [name] albuterol (PROVENTIL HFA;VENTOLIN HFA) 108 (90 Base) MCG/ACT inhaler Inhale 2 puffs into the lungs every 6 (six) hours as needed for wheezing or shortness of breath.    [name] amiodarone (PACERONE) 200 MG tablet Take 200 mg by mouth daily.    . bumetanide (BUMEX) 1 MG tablet Take 1 tablet (1 mg total) by mouth daily. 90 tablet 3  . buPROPion (WELLBUTRIN XL) 300 MG 24 hr tablet Take 300 mg by mouth every morning.    . Coenzyme Q10 (COQ10) 100 MG CAPS  Take 1 capsule by mouth daily.    . cyanocobalamin (,VITAMIN B-12,) 1000 MCG/ML injection Inject 1,000 mcg into the muscle every 30 (thirty) days.    [name] diltiazem (TIAZAC) 180 MG 24 hr capsule Take 1 capsule (180 mg total) by mouth every morning. 14 capsule 1  . ELIQUIS 5 MG TABS tablet TAKE 1 TABLET BY MOUTH 2 TIMES DAILY. (Patient taking differently: Take 5 mg by mouth 2 (two) times daily. ) 180 tablet 1  . escitalopram (LEXAPRO) 10 MG tablet Take 10 mg by mouth daily.    [name] losartan (COZAAR) 25 MG tablet Take 1 tablet (25 mg total) by mouth daily. 90 tablet 3  . magnesium oxide (MAG-OX) 400 MG tablet Take 400 mg by mouth daily.    . metolazone (ZAROXOLYN) 2.5 MG tablet Take 2.5 mg by mouth as needed.     . metoprolol tartrate (LOPRESSOR) 50 MG tablet Take 1 tablet (50 mg total) by mouth 2 (two) times daily. 180 tablet 3  . pantoprazole (PROTONIX) 40 MG tablet Take 1 tablet (40 mg total) by mouth daily. 45 tablet 0  . Polyethyl Glycol-Propyl Glycol (SYSTANE ULTRA) 0.4-0.3 % SOLN Place 1 drop into both eyes daily as needed (for dry eyes).    . potassium chloride SA (K-DUR,KLOR-CON) 20 MEQ tablet Take 1 tablet (20 mEq total) by mouth daily. 90 tablet 3  . tamsulosin (FLOMAX) 0.4 MG CAPS capsule Take 0.8 mg by mouth at bedtime.      No current facility-administered medications for this visit.     Allergies:   Ace inhibitors and Xarelto [rivaroxaban]    Social History:  The patient  reports that he has never smoked. He has never used smokeless tobacco. He reports that he does not drink alcohol or use drugs.   Family History:  The patient's family history includes Atrial fibrillation in his father; Cancer in his sister; Liver disease in his mother; Renal Disease in his mother; Stroke in his mother.    ROS:  Please see the history of present illness.   Otherwise, review of systems are positive for none.   All other systems are reviewed and negative.    PHYSICAL EXAM: VS:  BP (!) 141/81    Pulse (!) 112   Ht 5\' 10"  (1.778  m)   Wt 292 lb 6.4 oz (132.6 kg)   BMI 41.96 kg/m  , BMI Body mass index is 41.96 kg/m. GENERAL:  Well appearing HEENT: Pupils equal round and reactive, fundi not visualized, oral mucosa unremarkable NECK:  No jugular venous distention, waveform within normal limits, carotid upstroke brisk and symmetric, no bruits, no thyromegaly LYMPHATICS:  No cervical adenopathy LUNGS:  Clear to auscultation bilaterally HEART:  Irregularly irregular.  PMI not displaced or sustained,S1 and S2 within normal limits, no S3, no S4, no clicks, no rubs, no murmurs ABD:  Flat, positive bowel sounds normal in frequency in pitch, no bruits, no rebound, no guarding, no midline pulsatile mass, no hepatomegaly, no splenomegaly EXT:  2 plus pulses throughout, trace edema, no cyanosis no clubbing SKIN:  No rashes no nodules NEURO:  Cranial nerves II through XII grossly intact, motor grossly intact throughout PSYCH:  Cognitively intact, oriented to person place and time   EKG:  EKG is ordered today.   04/22/16: Sinus rhythm rate 67 bpm.  RSR' in V1. 05/20/16: Sinus rhythm.  Rate 75 bpm.  PACs.  06/29/17: Sinus rhythm.  Rate 83 bpm.  IVCD.  10/25/17: Atrial fibrillation.  Rate 115 bpm.  LAFB.   12/20/17: Sinus rhythm.  Sinus arrhythmia.  First-degree AV block.  Ventricular rate 68 bpm.  LAFB. 04/06/18: Atypical atrial flutter rate 106 bpm.   07/19/18: Atrial fibrillation.  Rate 91 bpm.  LAFB.  Poor R wave progression.   Lexiscan Myoview 04/21/18:  The left ventricular ejection fraction is normal (55-65%).  Nuclear stress EF: 60%.  There was no ST segment deviation noted during stress.  The study is normal.  This is a low risk study.   Soft tissue attenuation of anterior wall No ischemia or infarction Estimated EF 60%  Echo 12/11/15: Study Conclusions  - Left ventricle: The cavity size was normal. There was severe   focal basal hypertrophy of the septum. Systolic function was    normal. The estimated ejection fraction was in the range of 60%   to 65%. Wall motion was normal; there were no regional wall   motion abnormalities. Features are consistent with a pseudonormal   left ventricular filling pattern, with concomitant abnormal   relaxation and increased filling pressure (grade 2 diastolic   dysfunction). - Aortic valve: Trileaflet; mildly thickened, mildly calcified   leaflets. There was trivial regurgitation. - Aorta: Aortic root dimension: 41 mm (ED). - Ascending aorta: The ascending aorta was mildly dilated. - Left atrium: The atrium was moderately dilated.   Anterior-posterior dimension: 55 mm. Volume/bsa, ES, (1-plane   Simpson&'s, A2C): 40.2 ml/m^2.  Echo 07/08/15: Study Conclusions  - Left ventricle: The cavity size was normal. Wall thickness was increased in a pattern of moderate LVH. Systolic function was normal. The estimated ejection fraction was in the range of 60% to 65%. The study is not technically sufficient to allow evaluation of LV diastolic function. - Aortic valve: Trileaflet. Sclerosis without stenosis. There was mild regurgitation. - Mitral valve: Mildly thickened leaflets . There was mild regurgitation. - Left atrium: Moderately dilated at 43 ml/m2. - Right atrium: The atrium was mildly dilated. - Tricuspid valve: There was mild regurgitation. - Pulmonary arteries: PA peak pressure: 31 mm Hg (S). - Inferior vena cava: The vessel was normal in size. The respirophasic diameter changes were in the normal range (= 50%), consistent with normal central venous pressure.  Impressions:  - LVEF 60-65%, moderate LVH, normal wall motion, aortic valve sclerosis with  mild AI, MR and TR, RVSP 31 mmHg., moderate LAE, mild RAE.   Recent Labs: 05/09/2018: Magnesium 1.5 07/10/2018: ALT 23; BUN 18; Creatinine, Ser 1.06; Hemoglobin 11.0; Platelets 162; Potassium 4.0; Sodium 141; TSH 2.815    Lipid Panel    Component  Value Date/Time   CHOL 167 05/09/2018 0913   TRIG 164 (H) 05/09/2018 0913   HDL 46 05/09/2018 0913   CHOLHDL 3.6 05/09/2018 0913   LDLCALC 88 05/09/2018 0913      Wt Readings from Last 3 Encounters:  07/19/18 292 lb 6.4 oz (132.6 kg)  07/10/18 295 lb (133.8 kg)  06/09/18 298 lb 12.8 oz (135.5 kg)     Other studies Reviewed: Additional studies/ records that were reviewed today include: n/a  ASSESSMENT AND PLAN:  # Atrial fibrillation/flutter: # PVCs: Eric Mayer is back in atrial fibrillation.  He underwent ablation in October and initially did well but is now back in atrial fibrillation.  Unfortunately the next available date for cardioversion is 12/11.  He will accept this date and then if he worsens in the interim he will have to go to the emergency department where he could hopefully be cardioverted.  Continue amiodarone, metoprolol, and diltiazem.  He has not missed any doses.  He is scheduled to follow-up with Dr. Rayann Heman next month.    This patients CHA2DS2-VASc Score and unadjusted Ischemic Stroke Rate (% per year) is equal to 2.2 % stroke rate/year from a score of 2  Above score calculated as 1 point each if present [CHF, HTN, DM, Vascular=MI/PAD/Aortic Plaque, Age if 65-74, or Male]  Above score calculated as 2 points each if present [Age > 75, or Stroke/TIA/TE]  # Atypical Chest pain: Only occurs when tachycardic. Lexiscan Myoview was negative for ischemia 04/2018.  No chest pain recently.  # Hypertension:  BP well controlled on losartan, metoprolol, and diltiazem.  Losartan was reduced due to positional dizziness. Goal is <140/90.  # Chronic diastolic heart failure: Euvolemic today.  Continue bumex and prn metolazone.  Check BMP and magnesium.      Current medicines are reviewed at length with the patient today.  The patient does not have concerns regarding medicines.  The following changes have been made: none  Labs/ tests ordered today include:  Orders Placed  This Encounter  Procedures  . CBC with Differential/Platelet  . Basic metabolic panel  . EKG 12-Lead     Disposition:   FU with Dr. Oval Linsey in 1 month.    Signed, [name], MD  07/19/2018 11:39 AM    [place]

## 2018-07-19 NOTE — H&P (View-Only) (Signed)
Cardiology Office Note   Date:  07/19/2018   ID:  [name], DOB 1940-12-30, MRN [id number]  PCP:  [name], MD  Cardiologist:   [name], MD   No chief complaint on file.    History of Present Illness: Eric Mayer is a 77 y.o. male with HTN, paroxysmal atrial fibrillation/flutter, chronic diastolic heart failure, OSA on CPAP, asthma, BPPV, and prostate adenocarcinoma here for follow up.  Eric Mayer initially presented 03/2015 with dyspnea on exertion and chronic lower extremity edema.  He had a dobutamine stress echo 03/27/15 that was negative for ischemia.  He had an echo 06/2015 that revealed LVEF 60-65% with mild aortic regurgitation and mitral regurgitation.  They were unable to evaluate diastolic function.  He was admitted 07/2015 for acute on chronic diastolic heart failure and diuresed with IV lasix. He also underwent DCCV.  Eric Mayer was seen in clinic 04/2016 and reported fatigue, shortness of breath, weight gain, and low blood pressures.  At that appointment he was switched from Lasix to Bumex and his valsartan was reduced. His weight is labile and fluctuates with dieteary changes.  He has been very responsive to extra doses of Bumex as needed.  Eric Mayer saw [name] 07/21/16 with increased dyspnea on exertion.  He was noted to be back in atrial fibrillation in the setting of not wearing his CPAP for the preceding week.  He was started on amiodarone 200mg  and metoprolol was reduced to 25mg  bid.  He converted back to sinus rhythm and diuresed 10 lb after returning to sinus rhythm.  Given that he was on amiodarone he was referred for PFTs 08/20/16 that revealed severe obstruction and likely moderate restriction.  There is also moderate diffusion defect.  He was referred to pulmonology and saw Dr. Louis Meckel. He underwent bronchoscopy on 09/21/16 which showed no intrathoracic obstruction or tracheal abnormality. He was started on Symbicort.  He was also  referred to atrial fibrillation for consideration of alternatives to amiodarone. At this time there was not felt to be any pharmacologic alternatives to amiodarone. He was thought to be a poor candidate for ablation.    Eric Mayer had recurrent atrial fibrillation 09/2017.  He missed two doses of Eliquis so he couldn't have DCCV without TEE.  Losartan was reduced and metoprolol was increased.  He underwent TEE/DCCV on 10/28/17.    At his follow-up appointment he was back in atrial fibrillation.  He was referred for cardioversion n 04/12/2018 and was successfully converted with 1 shock.  He followed up in the atrial fibrillation clinic 04/19/2018.  Options of washing on amiodarone and switching to dofetilide and ablation were discussed.  Eric Mayer started feeling poorly again on 05/01/18 and was thought to be back in atrial fibrillation.  Cardioversion lasted less than 2 weeks.  He had a [employer] 04/2018 that was negative for ischemia.  LVEF was 60%.  At his last appointment amiodarone was increased to 200 mg twice daily.  He followed up with Dr. Rayann Heman and ultimately underwent ablation on 06/08/2018.  He followed up with Roderic Palau, NP on 11/25 and amiodarone was reduced back to 200mg  daily.  He has been feeling poorly for the last few weeks.  His heart rate increased around this same time.  Lately his heart rate has been in the 110s to 120s.  His weight has started going back down.  He took metolazone and increased bumetanide but only lost a couple pounds.  He is  having to use the walker more because he feels unsteady on his feet.  On review of his apple watch ECG strips it appears that he has been mostly in atrial fibrillation during this time.   Past Medical History:  Diagnosis Date  . Anemia 1980s X 1  . Arthritis    "hands, knees, hips, ankles" (07/31/2015)  . Atrial flutter ([place]) 07/31/2015  . Chronic diastolic heart failure ([place])    a. 11/2015: Echo w/ EF of 60-65%, no WMA, Grade 2 DD,  trivial AR, ascending aorta mildly dilated.   . Depression   . History of cardiovascular stress test    a. 03/2015: Dobutamine Stress Echo with no evidence of ischemia.   [name] History of gastric bypass   . History of peptic ulcer    1980's  . History of radiation therapy 1993   sarcoma of left groin, tx at [facility]  . History of sarcoma    1993  LEFT GOIN--  S/P SURGERY, RADIATION AND CHEMO IN [place]  . Hypertension   . Hypogonadism in male   . Incomplete right bundle branch block   . 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 Nocturia   . OSA on CPAP   . Persistent atrial fibrillation       . Primary prostate adenocarcinoma ([place]) DX 07/08/14   Gleason 7,  stage T1c  . PVC's (premature ventricular contractions) 11/21/2015  . Sarcoma ([place]) ~ 1993/1994   "of groin"  . Weakness of left leg    SECONDARY TO SURGICAL NERVE INJURY OF LEFT GOIN  . Wears glasses     Past Surgical History:  Procedure Laterality Date  . ATRIAL FIBRILLATION ABLATION N/A 06/08/2018   Procedure: ATRIAL FIBRILLATION ABLATION;  Surgeon: Thompson Grayer, MD;  Location: [place] [facility];  Service: Cardiovascular;  Laterality: N/A;  . CARDIAC CATHETERIZATION  08-12-2003  dr Tressia Miners turner   Normal coronary arteries, normal wall motion, no sig. abnormalities  . CARDIOVERSION N/A 08/04/2015   Procedure: CARDIOVERSION;  Surgeon: [name], MD;  Location: [place];  Service: Cardiovascular;  Laterality: N/A;  . CARDIOVERSION N/A 10/28/2017   Procedure: CARDIOVERSION;  Surgeon: Larey Dresser, MD;  Location: [facility] ENDOSCOPY;  Service: Cardiovascular;  Laterality: N/A;  . CARDIOVERSION N/A 04/12/2018   Procedure: CARDIOVERSION;  Surgeon: Thayer Headings, MD;  Location: [facility] ENDOSCOPY;  Service: Cardiovascular;  Laterality: N/A;  . COLONOSCOPY  07-03-2002  . [place]  . FRACTURE SURGERY    . GROIN DISSECTION  Left 1992   [place]   SARCOMA SURGERY  . INGUINAL HERNIA REPAIR Right 1950s?  . INTESTINAL BYPASS  1976   GASTRIC FOR OBESITY  . JOINT REPLACEMENT    . PATELLA FRACTURE SURGERY Left ~ 1995   "broke it twice; only had OR once"  . PROSTATE BIOPSY  06/2014  . RADIOACTIVE SEED IMPLANT N/A 10/17/2014   Procedure: RADIOACTIVE SEED IMPLANT    ;  Surgeon: Ailene Rud, MD;  Location[facility];  Service: Urology;  Laterality: N/A;   68 SEEDS IMPLANTED   . TEE WITHOUT CARDIOVERSION N/A 10/28/2017   Procedure: TRANSESOPHAGEAL ECHOCARDIOGRAM (TEE);  Surgeon: Larey Dresser, MD;  Location: [facility] ENDOSCOPY;  Service: Cardiovascular;  Laterality: N/A;  . TONSILLECTOMY  1950s  . TOTAL KNEE ARTHROPLASTY Right 12/17/2014   Procedure: TOTAL KNEE ARTHROPLASTY;  Surgeon: Melrose Nakayama, MD;  Location: [place];  Service: Orthopedics;  Laterality: Right;  . TRANSTHORACIC ECHOCARDIOGRAM  08-08-2003   moderate LVH/  ef 55-65%/  mild MR/  moderate LAE/  trivial TR/  trivial pericardial effusion posterior to the heart  . [place]  . VIDEO BRONCHOSCOPY Bilateral 09/21/2016   Procedure: VIDEO BRONCHOSCOPY WITHOUT FLUORO;  Surgeon: Collene Gobble, MD;  Location: [place];  Service: Cardiopulmonary;  Laterality: Bilateral;     Current Outpatient Medications  Medication Sig Dispense Refill  . acetaminophen (TYLENOL) 325 MG tablet Take 2 tablets (650 mg total) by mouth every 4 (four) hours as needed for headache or mild pain.    [name] albuterol (PROVENTIL HFA;VENTOLIN HFA) 108 (90 Base) MCG/ACT inhaler Inhale 2 puffs into the lungs every 6 (six) hours as needed for wheezing or shortness of breath.    [name] amiodarone (PACERONE) 200 MG tablet Take 200 mg by mouth daily.    . bumetanide (BUMEX) 1 MG tablet Take 1 tablet (1 mg total) by mouth daily. 90 tablet 3  . buPROPion (WELLBUTRIN XL) 300 MG 24 hr tablet Take 300 mg by mouth every morning.    . Coenzyme Q10 (COQ10) 100 MG CAPS  Take 1 capsule by mouth daily.    . cyanocobalamin (,VITAMIN B-12,) 1000 MCG/ML injection Inject 1,000 mcg into the muscle every 30 (thirty) days.    [name] diltiazem (TIAZAC) 180 MG 24 hr capsule Take 1 capsule (180 mg total) by mouth every morning. 14 capsule 1  . ELIQUIS 5 MG TABS tablet TAKE 1 TABLET BY MOUTH 2 TIMES DAILY. (Patient taking differently: Take 5 mg by mouth 2 (two) times daily. ) 180 tablet 1  . escitalopram (LEXAPRO) 10 MG tablet Take 10 mg by mouth daily.    [name] losartan (COZAAR) 25 MG tablet Take 1 tablet (25 mg total) by mouth daily. 90 tablet 3  . magnesium oxide (MAG-OX) 400 MG tablet Take 400 mg by mouth daily.    . metolazone (ZAROXOLYN) 2.5 MG tablet Take 2.5 mg by mouth as needed.     . metoprolol tartrate (LOPRESSOR) 50 MG tablet Take 1 tablet (50 mg total) by mouth 2 (two) times daily. 180 tablet 3  . pantoprazole (PROTONIX) 40 MG tablet Take 1 tablet (40 mg total) by mouth daily. 45 tablet 0  . Polyethyl Glycol-Propyl Glycol (SYSTANE ULTRA) 0.4-0.3 % SOLN Place 1 drop into both eyes daily as needed (for dry eyes).    . potassium chloride SA (K-DUR,KLOR-CON) 20 MEQ tablet Take 1 tablet (20 mEq total) by mouth daily. 90 tablet 3  . tamsulosin (FLOMAX) 0.4 MG CAPS capsule Take 0.8 mg by mouth at bedtime.      No current facility-administered medications for this visit.     Allergies:   Ace inhibitors and Xarelto [rivaroxaban]    Social History:  The patient  reports that he has never smoked. He has never used smokeless tobacco. He reports that he does not drink alcohol or use drugs.   Family History:  The patient's family history includes Atrial fibrillation in his father; Cancer in his sister; Liver disease in his mother; Renal Disease in his mother; Stroke in his mother.    ROS:  Please see the history of present illness.   Otherwise, review of systems are positive for none.   All other systems are reviewed and negative.    PHYSICAL EXAM: VS:  BP (!) 141/81    Pulse (!) 112   Ht 5\' 10"  (1.778  m)   Wt 292 lb 6.4 oz (132.6 kg)   BMI 41.96 kg/m  , BMI Body mass index is 41.96 kg/m. GENERAL:  Well appearing HEENT: Pupils equal round and reactive, fundi not visualized, oral mucosa unremarkable NECK:  No jugular venous distention, waveform within normal limits, carotid upstroke brisk and symmetric, no bruits, no thyromegaly LYMPHATICS:  No cervical adenopathy LUNGS:  Clear to auscultation bilaterally HEART:  Irregularly irregular.  PMI not displaced or sustained,S1 and S2 within normal limits, no S3, no S4, no clicks, no rubs, no murmurs ABD:  Flat, positive bowel sounds normal in frequency in pitch, no bruits, no rebound, no guarding, no midline pulsatile mass, no hepatomegaly, no splenomegaly EXT:  2 plus pulses throughout, trace edema, no cyanosis no clubbing SKIN:  No rashes no nodules NEURO:  Cranial nerves II through XII grossly intact, motor grossly intact throughout PSYCH:  Cognitively intact, oriented to person place and time   EKG:  EKG is ordered today.   04/22/16: Sinus rhythm rate 67 bpm.  RSR' in V1. 05/20/16: Sinus rhythm.  Rate 75 bpm.  PACs.  06/29/17: Sinus rhythm.  Rate 83 bpm.  IVCD.  10/25/17: Atrial fibrillation.  Rate 115 bpm.  LAFB.   12/20/17: Sinus rhythm.  Sinus arrhythmia.  First-degree AV block.  Ventricular rate 68 bpm.  LAFB. 04/06/18: Atypical atrial flutter rate 106 bpm.   07/19/18: Atrial fibrillation.  Rate 91 bpm.  LAFB.  Poor R wave progression.   Lexiscan Myoview 04/21/18:  The left ventricular ejection fraction is normal (55-65%).  Nuclear stress EF: 60%.  There was no ST segment deviation noted during stress.  The study is normal.  This is a low risk study.   Soft tissue attenuation of anterior wall No ischemia or infarction Estimated EF 60%  Echo 12/11/15: Study Conclusions  - Left ventricle: The cavity size was normal. There was severe   focal basal hypertrophy of the septum. Systolic function was    normal. The estimated ejection fraction was in the range of 60%   to 65%. Wall motion was normal; there were no regional wall   motion abnormalities. Features are consistent with a pseudonormal   left ventricular filling pattern, with concomitant abnormal   relaxation and increased filling pressure (grade 2 diastolic   dysfunction). - Aortic valve: Trileaflet; mildly thickened, mildly calcified   leaflets. There was trivial regurgitation. - Aorta: Aortic root dimension: 41 mm (ED). - Ascending aorta: The ascending aorta was mildly dilated. - Left atrium: The atrium was moderately dilated.   Anterior-posterior dimension: 55 mm. Volume/bsa, ES, (1-plane   Simpson&'s, A2C): 40.2 ml/m^2.  Echo 07/08/15: Study Conclusions  - Left ventricle: The cavity size was normal. Wall thickness was increased in a pattern of moderate LVH. Systolic function was normal. The estimated ejection fraction was in the range of 60% to 65%. The study is not technically sufficient to allow evaluation of LV diastolic function. - Aortic valve: Trileaflet. Sclerosis without stenosis. There was mild regurgitation. - Mitral valve: Mildly thickened leaflets . There was mild regurgitation. - Left atrium: Moderately dilated at 43 ml/m2. - Right atrium: The atrium was mildly dilated. - Tricuspid valve: There was mild regurgitation. - Pulmonary arteries: PA peak pressure: 31 mm Hg (S). - Inferior vena cava: The vessel was normal in size. The respirophasic diameter changes were in the normal range (= 50%), consistent with normal central venous pressure.  Impressions:  - LVEF 60-65%, moderate LVH, normal wall motion, aortic valve sclerosis with  mild AI, MR and TR, RVSP 31 mmHg., moderate LAE, mild RAE.   Recent Labs: 05/09/2018: Magnesium 1.5 07/10/2018: ALT 23; BUN 18; Creatinine, Ser 1.06; Hemoglobin 11.0; Platelets 162; Potassium 4.0; Sodium 141; TSH 2.815    Lipid Panel    Component  Value Date/Time   CHOL 167 05/09/2018 0913   TRIG 164 (H) 05/09/2018 0913   HDL 46 05/09/2018 0913   CHOLHDL 3.6 05/09/2018 0913   LDLCALC 88 05/09/2018 0913      Wt Readings from Last 3 Encounters:  07/19/18 292 lb 6.4 oz (132.6 kg)  07/10/18 295 lb (133.8 kg)  06/09/18 298 lb 12.8 oz (135.5 kg)     Other studies Reviewed: Additional studies/ records that were reviewed today include: n/a  ASSESSMENT AND PLAN:  # Atrial fibrillation/flutter: # PVCs: Eric Mayer is back in atrial fibrillation.  He underwent ablation in October and initially did well but is now back in atrial fibrillation.  Unfortunately the next available date for cardioversion is 12/11.  He will accept this date and then if he worsens in the interim he will have to go to the emergency department where he could hopefully be cardioverted.  Continue amiodarone, metoprolol, and diltiazem.  He has not missed any doses.  He is scheduled to follow-up with Dr. Rayann Heman next month.    This patients CHA2DS2-VASc Score and unadjusted Ischemic Stroke Rate (% per year) is equal to 2.2 % stroke rate/year from a score of 2  Above score calculated as 1 point each if present [CHF, HTN, DM, Vascular=MI/PAD/Aortic Plaque, Age if 65-74, or Male]  Above score calculated as 2 points each if present [Age > 75, or Stroke/TIA/TE]  # Atypical Chest pain: Only occurs when tachycardic. Lexiscan Myoview was negative for ischemia 04/2018.  No chest pain recently.  # Hypertension:  BP well controlled on losartan, metoprolol, and diltiazem.  Losartan was reduced due to positional dizziness. Goal is <140/90.  # Chronic diastolic heart failure: Euvolemic today.  Continue bumex and prn metolazone.  Check BMP and magnesium.      Current medicines are reviewed at length with the patient today.  The patient does not have concerns regarding medicines.  The following changes have been made: none  Labs/ tests ordered today include:  Orders Placed  This Encounter  Procedures  . CBC with Differential/Platelet  . Basic metabolic panel  . EKG 12-Lead     Disposition:   FU with Dr. Oval Linsey in 1 month.    Signed, [name], MD  07/19/2018 11:39 AM    [id number]

## 2018-07-25 ENCOUNTER — Telehealth: Payer: Self-pay | Admitting: Cardiovascular Disease

## 2018-07-25 NOTE — Telephone Encounter (Signed)
Spoke with patient and he did take an extra Bumex today for swelling. Otherwise he has no other change in symptoms. Advised patient to continue to monitor weight and if no improvement call back. Patient verbalized understanding. Discussed with Dr Oval Linsey who agreed with plan

## 2018-07-25 NOTE — Telephone Encounter (Signed)
New Message;   Patient had a weight gain of 5 pounds [employer]er, [employer]cl[employer] from [employer]. Thought Dr. Oval Linsey should know.

## 2018-07-26 ENCOUNTER — Ambulatory Visit (HOSPITAL_COMMUNITY): Payer: Medicare Other | Admitting: Certified Registered"

## 2018-07-26 ENCOUNTER — Encounter (HOSPITAL_COMMUNITY): Payer: Self-pay | Admitting: *Deleted

## 2018-07-26 ENCOUNTER — Ambulatory Visit (HOSPITAL_COMMUNITY)
Admission: RE | Admit: 2018-07-26 | Discharge: 2018-07-26 | Disposition: A | Discharge status: Home / Self Care | Payer: Medicare Other | Attending: Cardiology | Admitting: Cardiology

## 2018-07-26 ENCOUNTER — Other Ambulatory Visit: Payer: Self-pay

## 2018-07-26 ENCOUNTER — Encounter (HOSPITAL_COMMUNITY)
Admission: RE | Disposition: A | Discharge status: Home / Self Care | Payer: Self-pay | Source: Home / Self Care | Attending: Cardiology

## 2018-07-26 DIAGNOSIS — I4891 Unspecified atrial fibrillation: Secondary | ICD-10-CM | POA: Insufficient documentation

## 2018-07-26 DIAGNOSIS — I5033 Acute on chronic diastolic (congestive) heart failure: Secondary | ICD-10-CM | POA: Insufficient documentation

## 2018-07-26 DIAGNOSIS — Z823 Family history of stroke: Secondary | ICD-10-CM | POA: Diagnosis not present

## 2018-07-26 DIAGNOSIS — Z7901 Long term (current) use of anticoagulants: Secondary | ICD-10-CM | POA: Diagnosis not present

## 2018-07-26 DIAGNOSIS — Z6841 Body Mass Index (BMI) 40.0 and over, adult: Secondary | ICD-10-CM | POA: Diagnosis not present

## 2018-07-26 DIAGNOSIS — Z9884 Bariatric surgery status: Secondary | ICD-10-CM | POA: Diagnosis not present

## 2018-07-26 DIAGNOSIS — M1990 Unspecified osteoarthritis, unspecified site: Secondary | ICD-10-CM | POA: Diagnosis not present

## 2018-07-26 DIAGNOSIS — E119 Type 2 diabetes mellitus without complications: Secondary | ICD-10-CM | POA: Insufficient documentation

## 2018-07-26 DIAGNOSIS — Z79899 Other long term (current) drug therapy: Secondary | ICD-10-CM | POA: Diagnosis not present

## 2018-07-26 DIAGNOSIS — J45909 Unspecified asthma, uncomplicated: Secondary | ICD-10-CM | POA: Insufficient documentation

## 2018-07-26 DIAGNOSIS — Z955 Presence of coronary angioplasty implant and graft: Secondary | ICD-10-CM | POA: Insufficient documentation

## 2018-07-26 DIAGNOSIS — I4892 Unspecified atrial flutter: Secondary | ICD-10-CM | POA: Diagnosis not present

## 2018-07-26 DIAGNOSIS — G4733 Obstructive sleep apnea (adult) (pediatric): Secondary | ICD-10-CM | POA: Diagnosis not present

## 2018-07-26 DIAGNOSIS — R0789 Other chest pain: Secondary | ICD-10-CM | POA: Insufficient documentation

## 2018-07-26 DIAGNOSIS — Z8249 Family history of ischemic heart disease and other diseases of the circulatory system: Secondary | ICD-10-CM | POA: Insufficient documentation

## 2018-07-26 DIAGNOSIS — I110 Hypertensive heart disease with heart failure: Secondary | ICD-10-CM | POA: Insufficient documentation

## 2018-07-26 DIAGNOSIS — E669 Obesity, unspecified: Secondary | ICD-10-CM | POA: Diagnosis not present

## 2018-07-26 DIAGNOSIS — Z9889 Other specified postprocedural states: Secondary | ICD-10-CM | POA: Diagnosis not present

## 2018-07-26 DIAGNOSIS — C61 Malignant neoplasm of prostate: Secondary | ICD-10-CM | POA: Diagnosis not present

## 2018-07-26 DIAGNOSIS — I493 Ventricular premature depolarization: Secondary | ICD-10-CM | POA: Insufficient documentation

## 2018-07-26 HISTORY — PX: CARDIOVERSION: SHX1299

## 2018-07-26 SURGERY — CARDIOVERSION
Anesthesia: General

## 2018-07-26 MED ORDER — PROPOFOL 10 MG/ML IV BOLUS
INTRAVENOUS | Status: DC | PRN
  Administered 2018-07-26: 30 mg via INTRAVENOUS
  Administered 2018-07-26: 60 mg via INTRAVENOUS

## 2018-07-26 MED ORDER — LIDOCAINE 2% (20 MG/ML) 5 ML SYRINGE
INTRAMUSCULAR | Status: DC | PRN
  Administered 2018-07-26: 60 mg via INTRAVENOUS

## 2018-07-26 MED ORDER — SODIUM CHLORIDE 0.9 % IV SOLN
INTRAVENOUS | Status: DC
  Administered 2018-07-26: 14:00:00 via INTRAVENOUS

## 2018-07-26 NOTE — Transfer of Care (Signed)
Immediate Anesthesia Transfer of Care Note  Patient: Eric Mayer  Procedure(s) Performed: CARDIOVERSION (N/A )  Patient Location: Endoscopy Unit  Anesthesia Type:General  Level of Consciousness: drowsy  Airway & Oxygen Therapy: Patient Spontanous Breathing and Patient connected to nasal cannula oxygen  Post-op Assessment: Report given to RN and Post -op Vital signs reviewed and stable  Post vital signs: Reviewed and stable  Last Vitals:  Vitals Value Taken Time  BP    Temp    Pulse    Resp    SpO2      Last Pain:  Vitals:   07/26/18 1345  TempSrc: Oral  PainSc: 0-No pain         Complications: No apparent anesthesia complications

## 2018-07-26 NOTE — Anesthesia Preprocedure Evaluation (Addendum)
Anesthesia Evaluation  Patient identified by MRN, date of birth, ID band Patient awake    Reviewed: Allergy & Precautions, H&P , NPO status , Patient's Chart, lab work & pertinent test results  Airway Mallampati: II  TM Distance: >3 FB Neck ROM: Full    Dental no notable dental hx. (+) Teeth Intact, Dental Advisory Given   Pulmonary asthma , sleep apnea and Continuous Positive Airway Pressure Ventilation ,    Pulmonary exam normal breath sounds clear to auscultation       Cardiovascular hypertension, Pt. on medications +CHF  + dysrhythmias Atrial Fibrillation  Rhythm:Irregular Rate:Normal     Neuro/Psych Depression negative neurological ROS     GI/Hepatic negative GI ROS, Neg liver ROS,   Endo/Other  Morbid obesity  Renal/GU negative Renal ROS  negative genitourinary   Musculoskeletal  (+) Arthritis , Osteoarthritis,    Abdominal   Peds  Hematology negative hematology ROS (+)   Anesthesia Other Findings   Reproductive/Obstetrics negative OB ROS                            Anesthesia Physical Anesthesia Plan  ASA: III  Anesthesia Plan: General   Post-op Pain Management:    Induction: Intravenous  PONV Risk Score and Plan: 2 and Treatment may vary due to age or medical condition  Airway Management Planned: Mask  Additional Equipment:   Intra-op Plan:   Post-operative Plan:   Informed Consent: I have reviewed the patients History and Physical, chart, labs and discussed the procedure including the risks, benefits and alternatives for the proposed anesthesia with the patient or authorized representative who has indicated his/her understanding and acceptance.   Dental advisory given  Plan Discussed with: CRNA  Anesthesia Plan Comments:         Anesthesia Quick Evaluation

## 2018-07-26 NOTE — Discharge Instructions (Signed)
Electrical Cardioversion, Care After °This sheet gives you information about how to care for yourself after your procedure. Your health care provider may also give you more specific instructions. If you have problems or questions, contact your health care provider. °What can I expect after the procedure? °After the procedure, it is common to have: °· Some redness on the skin where the shocks were given. ° °Follow these instructions at home: °· Do not drive for 24 hours if you were given a medicine to help you relax (sedative). °· Take over-the-counter and prescription medicines only as told by your health care provider. °· Ask your health care provider how to check your pulse. Check it often. °· Rest for 48 hours after the procedure or as told by your health care provider. °· Avoid or limit your caffeine use as told by your health care provider. °Contact a health care provider if: °· You feel like your heart is beating too quickly or your pulse is not regular. °· You have a serious muscle cramp that does not go away. °Get help right away if: °· You have discomfort in your chest. °· You are dizzy or you feel faint. °· You have trouble breathing or you are short of breath. °· Your speech is slurred. °· You have trouble moving an arm or leg on one side of your body. °· Your fingers or toes turn cold or blue. °This information is not intended to replace advice given to you by your health care provider. Make sure you discuss any questions you have with your health care provider. °Document Released: 05/23/2013 Document Revised: 03/05/2016 Document Reviewed: 02/06/2016 °Elsevier Interactive Patient Education © 2018 [employer] ° °

## 2018-07-26 NOTE — Interval H&P Note (Signed)
History and Physical Interval Note:  07/26/2018 2:03 PM  Eric Mayer  has presented today for surgery, with the diagnosis of AFIB  The various methods of treatment have been discussed with the patient and family. After consideration of risks, benefits and other options for treatment, the patient has consented to  Procedure(s): CARDIOVERSION (N/A) as a surgical intervention .  The patient's history has been reviewed, patient examined, no change in status, stable for surgery.  I have reviewed the patient's chart and labs.  Questions were answered to the patient's satisfaction.     [name] [employer]

## 2018-07-26 NOTE — Procedures (Signed)
Electrical Cardioversion Procedure Note Eric Mayer [id number] [birth year]  Procedure: Electrical Cardioversion Indications:  Atrial Fibrillation  Procedure Details Consent: Risks of procedure as well as the alternatives and risks of each were explained to the (patient/caregiver).  Consent for procedure obtained. Time Out: Verified patient identification, verified procedure, site/side was marked, verified correct patient position, special equipment/implants available, medications/allergies/relevent history reviewed, required imaging and test results available.  Performed  Patient placed on cardiac monitor, pulse oximetry, supplemental oxygen as necessary.  Sedation given: Propofol per anesthesiology Pacer pads placed anterior and posterior chest.  Cardioverted 1 time(s).  Cardioverted at [place].  Evaluation Findings: Post procedure EKG shows: NSR Complications: None Patient did tolerate procedure well.   [name] 07/26/2018, 2:12 PM

## 2018-07-26 NOTE — Anesthesia Postprocedure Evaluation (Signed)
Anesthesia Post Note  Patient: Eric Mayer  Procedure(s) Performed: CARDIOVERSION (N/A )     Patient location during evaluation: Endoscopy Anesthesia Type: General Level of consciousness: awake and alert, awake and oriented Pain management: pain level controlled Vital Signs Assessment: post-procedure vital signs reviewed and stable Respiratory status: spontaneous breathing, nonlabored ventilation and respiratory function stable Cardiovascular status: blood pressure returned to baseline and stable Postop Assessment: no apparent nausea or vomiting Anesthetic complications: no    Last Vitals:  Vitals:   07/26/18 1426 07/26/18 1436  BP: (!) 125/54 (!) 143/67  Pulse: 63 66  Resp: 17 20  Temp:    SpO2: 94% 96%    Last Pain:  Vitals:   07/26/18 1436  TempSrc:   PainSc: 0-No pain                 Catalina Gravel

## 2018-07-28 ENCOUNTER — Encounter (HOSPITAL_COMMUNITY): Payer: Self-pay | Admitting: Cardiology

## 2018-08-12 ENCOUNTER — Other Ambulatory Visit: Payer: Self-pay | Admitting: Cardiovascular Disease

## 2018-08-17 ENCOUNTER — Ambulatory Visit (INDEPENDENT_AMBULATORY_CARE_PROVIDER_SITE_OTHER): Payer: Medicare Other | Admitting: Cardiology

## 2018-08-17 ENCOUNTER — Encounter: Payer: Self-pay | Admitting: Cardiology

## 2018-08-17 VITALS — BP 138/74 | HR 70 | Ht 70.0 in | Wt 296.0 lb

## 2018-08-17 DIAGNOSIS — I4819 Other persistent atrial fibrillation: Secondary | ICD-10-CM

## 2018-08-17 DIAGNOSIS — G4730 Sleep apnea, unspecified: Secondary | ICD-10-CM | POA: Diagnosis not present

## 2018-08-17 DIAGNOSIS — I480 Paroxysmal atrial fibrillation: Secondary | ICD-10-CM | POA: Diagnosis not present

## 2018-08-17 DIAGNOSIS — I10 Essential (primary) hypertension: Secondary | ICD-10-CM

## 2018-08-17 DIAGNOSIS — I5032 Chronic diastolic (congestive) heart failure: Secondary | ICD-10-CM

## 2018-08-17 DIAGNOSIS — Z7901 Long term (current) use of anticoagulants: Secondary | ICD-10-CM | POA: Diagnosis not present

## 2018-08-17 NOTE — Progress Notes (Signed)
08/17/2018 Eric Mayer   1940-10-31  [id number]  Primary Physician [name], MD Primary Cardiologist: Dr Oval Linsey- Dr Rayann Heman EP  HPI: The patient is a 78 year old male followed by Dr. Oval Linsey and Dr. Rayann Heman.  He has a history of PAF, morbid obesity, OSA-on C-pap, HTN, D-CHD, and COPD. His last echo was March 2019 and showed normal LVF-EF 55-60% with moderate LVH.  He had a coronary CTA in Oct 2019 prior to his RFA that showed Normal coronary origin. Right dominance. The study was performed without use of NTG and insufficient for plaque evaluation. Calcium score is 255 that represents 50th percentile for age/sex.   The patient has had multiple episodes of symptomatic PAF and has had multiple cardioversions.  He ultimately underwent radiofrequency ablation June 08, 2018.  Unfortunately had recurrent atrial fibrillation again and underwent cardioversion on July 26, 2018.  He is in the office today for follow-up.  He is in sinus rhythm today in the office.  He does complain of generalized fatigue.  He is compliant with his CPAP.  He has had no other problems with his medications.  He is currently on amiodarone 200 mg daily as well as Eliquis 5 mg twice daily Bumex, Tiazac, losartan, and metoprolol 50 mg twice daily.   Current Outpatient Medications  Medication Sig Dispense Refill  . acetaminophen (TYLENOL) 325 MG tablet Take 2 tablets (650 mg total) by mouth every 4 (four) hours as needed for headache or mild pain.    [name] albuterol (PROVENTIL HFA;VENTOLIN HFA) 108 (90 Base) MCG/ACT inhaler Inhale 2 puffs into the lungs every 6 (six) hours as needed for wheezing or shortness of breath.    [name] amiodarone (PACERONE) 200 MG tablet Take 200 mg by mouth daily.    . bumetanide (BUMEX) 2 MG tablet TAKE 1 TABLET BY MOUTH DAILY AND AN EXTRA TABLET FOR WEIGHT GAIN 2 POUNDS DAILY OR 5 POUNDS WEEKLY 180 tablet 3  . buPROPion (WELLBUTRIN XL) 300 MG 24 hr tablet Take 300 mg by mouth every morning.      [name] CLOTRIMAZOLE EX Apply 1 application topically 2 (two) times daily as needed (for itching).    . Coenzyme Q10 (COQ10) 100 MG CAPS Take 100 mg by mouth every evening.     . cyanocobalamin (,VITAMIN B-12,) 1000 MCG/ML injection Inject 1,000 mcg into the muscle every 30 (thirty) days.    [name] diltiazem (TIAZAC) 180 MG 24 hr capsule Take 1 capsule (180 mg total) by mouth every morning. 14 capsule 1  . ELIQUIS 5 MG TABS tablet TAKE 1 TABLET BY MOUTH 2 TIMES DAILY. (Patient taking differently: Take 5 mg by mouth 2 (two) times daily. ) 180 tablet 1  . escitalopram (LEXAPRO) 10 MG tablet Take 10 mg by mouth daily.    [name] losartan (COZAAR) 25 MG tablet Take 1 tablet (25 mg total) by mouth daily. 90 tablet 3  . magnesium oxide (MAG-OX) 400 MG tablet Take 400 mg by mouth daily.    . metolazone (ZAROXOLYN) 2.5 MG tablet Take 2.5 mg by mouth daily as needed (for fluid retention).     . metoprolol tartrate (LOPRESSOR) 50 MG tablet Take 1 tablet (50 mg total) by mouth 2 (two) times daily. 180 tablet 3  . pantoprazole (PROTONIX) 40 MG tablet Take 1 tablet (40 mg total) by mouth daily. 45 tablet 0  . Polyethyl Glycol-Propyl Glycol (SYSTANE ULTRA) 0.4-0.3 % SOLN Place 1 drop into both eyes daily as needed (for dry eyes).    [name]  potassium chloride SA (K-DUR,KLOR-CON) 20 MEQ tablet Take 1 tablet (20 mEq total) by mouth daily. 90 tablet 3  . tamsulosin (FLOMAX) 0.4 MG CAPS capsule Take 0.8 mg by mouth at bedtime.      No current facility-administered medications for this visit.     Allergies  Allergen Reactions  . Ace Inhibitors Cough  . Xarelto [Rivaroxaban] Other (See Comments)    Joint pain    Past Medical History:  Diagnosis Date  . Anemia 1980s X 1  . Arthritis    "hands, knees, hips, ankles" (07/31/2015)  . Atrial flutter ([place]) 07/31/2015  . Chronic diastolic heart failure ([place])    a. 11/2015: Echo w/ EF of 60-65%, no WMA, Grade 2 DD, trivial AR, ascending aorta mildly dilated.   . Depression   .  History of cardiovascular stress test    a. 03/2015: Dobutamine Stress Echo with no evidence of ischemia.   [name] History of gastric bypass   . History of peptic ulcer    1980's  . History of radiation therapy 1993   sarcoma of left groin, tx at [facility]  . History of sarcoma    1993  LEFT GOIN--  S/P SURGERY, RADIATION AND CHEMO IN [place]  . Hypertension   . Hypogonadism in male   . Incomplete right bundle branch block   . 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 Nocturia   . OSA on CPAP   . Persistent atrial fibrillation       . 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 PVC's (premature ventricular contractions) 11/21/2015  . 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 Weakness of left leg    SECONDARY TO SURGICAL NERVE INJURY OF LEFT GOIN  . Wears glasses     Social History   Socioeconomic History  . Marital status: Married    Spouse name: Not on file  . Number of children: Not on file  . Years of education: Not on file  . Highest education level: Not on file  Occupational History  . Not on file  Social Needs  . Financial resource strain: Not on file  . Food insecurity:    Worry: Not on file    Inability: Not on file  . Transportation needs:    Medical: Not on file    Non-medical: Not on file  Tobacco Use  . Smoking status: Never Smoker  . Smokeless tobacco: Never Used  Substance and Sexual Activity  . Alcohol use: No  . Drug use: No  . Sexual activity: Not Currently  Lifestyle  . Physical activity:    Days per week: Not on file    Minutes per session: Not on file  . Stress: Not on file  Relationships  . Social connections:    Talks on phone: Not on file    Gets together: Not on file    Attends religious service: Not on file    Active member of club or organization: Not on file    Attends meetings of clubs or organizations: Not on file      Relationship status: Not on file  . Intimate partner violence:    Fear of current or ex partner: Not on file    Emotionally abused: Not on file    Physically abused: Not on file    Forced sexual  activity: Not on file  Other Topics Concern  . 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 Liver disease Mother   . Renal Disease Mother   . Stroke Mother   . Atrial fibrillation Father   . Cancer Sister      Review of Systems: General: negative for chills, fever, night sweats or weight changes.  Cardiovascular: negative for chest pain, dyspnea on exertion, edema, orthopnea, palpitations, paroxysmal nocturnal dyspnea or shortness of breath Dermatological: negative for rash Respiratory: negative for cough or wheezing Urologic: negative for hematuria Abdominal: negative for nausea, vomiting, diarrhea, bright red blood per rectum, melena, or hematemesis Neurologic: negative for visual changes, syncope, or dizziness All other systems reviewed and are otherwise negative except as noted above.    Blood pressure 138/74, pulse 70, height 5\' 10"  (1.778 m), weight 296 lb (134.3 kg).  General appearance: alert, cooperative, no distress and morbidly obese Lungs: clear to auscultation bilaterally and kyphosis Heart: regular rate and rhythm Extremities: trace edema Neurologic: Grossly normal  EKG NSR, HR 70- QTc 447  ASSESSMENT AND PLAN:   Atrial fibrillation (HCC) Recurrent PAF- RFA 06/08/18- DCCV 07/26/18- NSR today in the office  Chronic anticoagulation Eliquis  Sleep apnea-on C-Pap Reports compliance   Obesity BMI 42  Chronic diastolic congestive heart failure (HCC) EF preserved, moderate LVH, grade 2 DD on prior echo.  PLAN  No change in Rx- his most recent TSH was WNL Nov 2019.  Keep f/u with Dr Rayann Heman as scheduled.   [name] PA-C 08/17/2018 11:08 AM

## 2018-08-17 NOTE — Patient Instructions (Signed)
Medication Instructions:  Your physician recommends that you continue on your current medications as directed. Please refer to the Current Medication list given to you today.  If you need a refill on your cardiac medications before your next appointment, please call your pharmacy.   Lab work: NONE  If you have labs (blood work) drawn today and your tests are completely normal, you will receive your results only by: [name] MyChart Message (if you have MyChart) OR . A paper copy in the mail If you have any lab test that is abnormal or we need to change your treatment, we will call you to review the results.  Testing/Procedures:  NONE  Follow-Up:  . Lurena Joiner recommends that you follow up as scheduled with Dr. Rayann Heman  Any Other Special Instructions Will Be Listed Below (If Applicable).

## 2018-08-18 ENCOUNTER — Other Ambulatory Visit: Payer: Self-pay | Admitting: Cardiovascular Disease

## 2018-09-09 ENCOUNTER — Other Ambulatory Visit: Payer: Self-pay | Admitting: Cardiovascular Disease

## 2018-09-13 ENCOUNTER — Encounter: Payer: Self-pay | Admitting: Internal Medicine

## 2018-09-13 ENCOUNTER — Ambulatory Visit (INDEPENDENT_AMBULATORY_CARE_PROVIDER_SITE_OTHER): Payer: Medicare Other | Admitting: Internal Medicine

## 2018-09-13 VITALS — BP 116/62 | HR 97 | Ht 70.0 in | Wt 290.4 lb

## 2018-09-13 DIAGNOSIS — I4892 Unspecified atrial flutter: Secondary | ICD-10-CM

## 2018-09-13 DIAGNOSIS — I5032 Chronic diastolic (congestive) heart failure: Secondary | ICD-10-CM

## 2018-09-13 DIAGNOSIS — I10 Essential (primary) hypertension: Secondary | ICD-10-CM | POA: Diagnosis not present

## 2018-09-13 DIAGNOSIS — I4819 Other persistent atrial fibrillation: Secondary | ICD-10-CM

## 2018-09-13 MED ORDER — AMIODARONE HCL 200 MG PO TABS: 200.0000 mg | ORAL_TABLET | Freq: Two times a day (BID) | ORAL | 3 refills | Status: DC

## 2018-09-13 NOTE — Patient Instructions (Addendum)
Medication Instructions:  Your physician has recommended you make the following change in your medication:  1.  Increase your amiodarone 200 mg--Take one tablet by mouth twice a day  Labwork: None ordered.  Testing/Procedures:  You will have an EKG in one week at the afib clinic-after increasing amiodarone   Follow-Up: Your physician wants you to follow-up in: 2 months with Dr. Rayann Heman.      Any Other Special Instructions Will Be Listed Below (If Applicable).  If you need a refill on your cardiac medications before your next appointment, please call your pharmacy.

## 2018-09-13 NOTE — Progress Notes (Signed)
PCP: [name], MD Primary Cardiologist: Dr Oval Linsey Primary EP: Dr Aretta Nip is a 78 y.o. male who presents today for routine electrophysiology followup.  Since last being seen in our clinic, the patient reports feeling well. He is s/p afib ablation 06/09/18. He did have some afib with RVR about two months ago and underwent DCCV. He reports that he feels well overall and is not aware when he is in or out of rhythm. He has a Apple watch which shows mostly sinus rhythm but still with episodes of afib. He is afib today.   Today, he denies symptoms of palpitations, chest pain, shortness of breath,  lower extremity edema, dizziness, presyncope, or syncope.  The patient is otherwise without complaint today.   Past Medical History:  Diagnosis Date  . Anemia 1980s X 1  . Arthritis    "hands, knees, hips, ankles" (07/31/2015)  . Atrial flutter ([place]) 07/31/2015  . Chronic diastolic heart failure ([place])    a. 11/2015: Echo w/ EF of 60-65%, no WMA, Grade 2 DD, trivial AR, ascending aorta mildly dilated.   . Depression   . History of cardiovascular stress test    a. 03/2015: Dobutamine Stress Echo with no evidence of ischemia.   [name] History of gastric bypass   . History of peptic ulcer    1980's  . History of radiation therapy 1993   sarcoma of left groin, tx at [facility]  . History of sarcoma    1993  LEFT GOIN--  S/P SURGERY, RADIATION AND CHEMO IN [place]  . Hypertension   . Hypogonadism in male   . Incomplete right bundle branch block   . 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 Nocturia   . OSA on CPAP   . Persistent atrial fibrillation       . 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 PVC's (premature ventricular contractions) 11/21/2015  . 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 Weakness of left leg    SECONDARY TO SURGICAL NERVE INJURY OF  LEFT GOIN  . Wears glasses    Past Surgical History:  Procedure Laterality Date  . ATRIAL FIBRILLATION ABLATION N/A 06/08/2018   Procedure: ATRIAL FIBRILLATION ABLATION;  Surgeon: Thompson Grayer, MD;  Location: [facility];  Service: Cardiovascular;  Laterality: N/A;  . CARDIAC CATHETERIZATION  08-12-2003  dr Tressia Miners turner   Normal coronary arteries, normal wall motion, no sig. abnormalities  . CARDIOVERSION N/A 08/04/2015   Procedure: CARDIOVERSION;  Surgeon: [name], MD;  Location: [place];  Service: Cardiovascular;  Laterality: N/A;  . CARDIOVERSION N/A 10/28/2017   Procedure: CARDIOVERSION;  Surgeon: Larey Dresser, MD;  [facility] ENDOSCOPY;  Service: Cardiovascular;  Laterality: N/A;  . CARDIOVERSION N/A 04/12/2018   Procedure: CARDIOVERSION;  Surgeon: Thayer Headings, MD;  Location: [facility] ENDOSCOPY;  Service: Cardiovascular;  Laterality: N/A;  . CARDIOVERSION N/A 07/26/2018   Procedure: CARDIOVERSION;  Surgeon: Larey Dresser, MD;  Location: [facility] ENDOSCOPY;  Service: Cardiovascular;  Laterality: N/A;  . COLONOSCOPY  07-03-2002  . [place]  . FRACTURE SURGERY    . GROIN DISSECTION Left 1992   [place]   SARCOMA SURGERY  . INGUINAL HERNIA REPAIR Right 1950s?  . INTESTINAL BYPASS  1976   GASTRIC FOR OBESITY  . JOINT REPLACEMENT    .  PATELLA FRACTURE SURGERY Left ~ 1995   "broke it twice; only had OR once"  . PROSTATE BIOPSY  06/2014  . RADIOACTIVE SEED IMPLANT N/A 10/17/2014   Procedure: RADIOACTIVE SEED IMPLANT    ;  Surgeon: Ailene Rud, MD;  Location[facility];  Service: Urology;  Laterality: N/A;   68 SEEDS IMPLANTED   . TEE WITHOUT CARDIOVERSION N/A 10/28/2017   Procedure: TRANSESOPHAGEAL ECHOCARDIOGRAM (TEE);  Surgeon: Larey Dresser, MD;  Location: [facility] ENDOSCOPY;  Service: Cardiovascular;  Laterality: N/A;  . TONSILLECTOMY  1950s  . TOTAL KNEE ARTHROPLASTY Right 12/17/2014   Procedure: TOTAL KNEE  ARTHROPLASTY;  Surgeon: Melrose Nakayama, MD;  Location: [place];  Service: Orthopedics;  Laterality: Right;  . TRANSTHORACIC ECHOCARDIOGRAM  08-08-2003   moderate LVH/  ef 55-65%/  mild MR/  moderate LAE/  trivial TR/  trivial pericardial effusion posterior to the heart  . [place]  . VIDEO BRONCHOSCOPY Bilateral 09/21/2016   Procedure: VIDEO BRONCHOSCOPY WITHOUT FLUORO;  Surgeon: Collene Gobble, MD;  Location: [place];  Service: Cardiopulmonary;  Laterality: Bilateral;    ROS- all systems are reviewed and negatives except as per HPI above  Current Outpatient Medications  Medication Sig Dispense Refill  . acetaminophen (TYLENOL) 325 MG tablet Take 2 tablets (650 mg total) by mouth every 4 (four) hours as needed for headache or mild pain.    [name] albuterol (PROVENTIL HFA;VENTOLIN HFA) 108 (90 Base) MCG/ACT inhaler Inhale 2 puffs into the lungs every 6 (six) hours as needed for wheezing or shortness of breath.    [name] amiodarone (PACERONE) 200 MG tablet Take 1 tablet (200 mg total) by mouth 2 (two) times daily. 180 tablet 3  . bumetanide (BUMEX) 2 MG tablet TAKE 1 TABLET BY MOUTH DAILY AND AN EXTRA TABLET FOR WEIGHT GAIN 2 POUNDS DAILY OR 5 POUNDS WEEKLY 180 tablet 3  . buPROPion (WELLBUTRIN XL) 300 MG 24 hr tablet Take 300 mg by mouth every morning.    [name] CLOTRIMAZOLE EX Apply 1 application topically 2 (two) times daily as needed (for itching).    . Coenzyme Q10 (COQ10) 100 MG CAPS Take 100 mg by mouth every evening.     . cyanocobalamin (,VITAMIN B-12,) 1000 MCG/ML injection Inject 1,000 mcg into the muscle every 30 (thirty) days.    [name] diltiazem (TIAZAC) 180 MG 24 hr capsule Take 1 capsule (180 mg total) by mouth every morning. 14 capsule 1  . ELIQUIS 5 MG TABS tablet TAKE 1 TABLET BY MOUTH 2 TIMES DAILY. 180 tablet 1  . escitalopram (LEXAPRO) 10 MG tablet Take 10 mg by mouth daily.    [name] losartan (COZAAR) 25 MG tablet Take 1 tablet (25 mg total) by mouth daily. 90 tablet 3  .  magnesium oxide (MAG-OX) 400 MG tablet Take 400 mg by mouth daily.    . metolazone (ZAROXOLYN) 2.5 MG tablet Take 2.5 mg by mouth daily as needed (for fluid retention).     . metoprolol tartrate (LOPRESSOR) 50 MG tablet Take 1 tablet (50 mg total) by mouth 2 (two) times daily. 180 tablet 3  . Polyethyl Glycol-Propyl Glycol (SYSTANE ULTRA) 0.4-0.3 % SOLN Place 1 drop into both eyes daily as needed (for dry eyes).    . potassium chloride SA (K-DUR,KLOR-CON) 20 MEQ tablet TAKE 2 TABLETS BY MOUTH DAILY. 180 tablet 0  . tamsulosin (FLOMAX) 0.4 MG CAPS capsule Take 0.8 mg by mouth at bedtime.     . pantoprazole (PROTONIX)  40 MG tablet Take 1 tablet (40 mg total) by mouth daily. 45 tablet 0   No current facility-administered medications for this visit.     Physical Exam: Vitals:   09/13/18 1221  BP: 116/62  Pulse: 97  SpO2: 95%  Weight: 290 lb 6.4 oz (131.7 kg)  Height: 5\' 10"  (1.778 m)    GEN- The patient is well appearing, obese male alert and oriented x 3 today.   Head- normocephalic, atraumatic Eyes-  Sclera clear, conjunctiva pink Ears- hearing intact Oropharynx- clear Lungs- Clear to ausculation bilaterally, normal work of breathing Heart- irregular rate and rhythm, no murmurs, rubs or gallops, PMI not laterally displaced GI- soft, NT, ND, + BS Extremities- no clubbing, cyanosis, or edema  Wt Readings from Last 3 Encounters:  09/13/18 290 lb 6.4 oz (131.7 kg)  08/17/18 296 lb (134.3 kg)  07/26/18 292 lb 6.4 oz (132.6 kg)    EKG tracing ordered today is personally reviewed and shows atrial fibrillation HR 96, LAD, QRS 106, QTC 477  Assessment and Plan:  1. Persistent atrial fibrillation/flutter S/p Afib/flutter ablation 05/2018 S/p DCCV 07/19/18. Still having intermittent episodes of atrial fibrillation but overall feels much better than prior to his procedure. Will increase amiodarone to 200 mg BID for now. Return to Afib clinic in one week for ECG. If he is still Afib,  will consider repeat DCCV now that he is outside the three month recovery period.   2. Chronic diastolic dysfunction No signs of overt fluid overload. Continue present therapy.  3. HTN Stable, no changes today  4. Obesity Body mass index is 41.67 kg/m. We spoke again about lifestyle modifications including regular physical activity and weight loss. Patient is in agreement.  Follow up with Afib clinic in 1 week for ECG. Follow up with me in 2 months.  Thompson Grayer MD 09/13/2018 12:58 PM

## 2018-09-20 ENCOUNTER — Ambulatory Visit (HOSPITAL_COMMUNITY)
Admission: RE | Admit: 2018-09-20 | Discharge: 2018-09-20 | Disposition: A | Discharge status: Home / Self Care | Payer: Medicare Other | Source: Ambulatory Visit | Attending: Nurse Practitioner | Admitting: Nurse Practitioner

## 2018-09-20 ENCOUNTER — Encounter (HOSPITAL_COMMUNITY): Payer: Self-pay | Admitting: Nurse Practitioner

## 2018-09-20 VITALS — BP 110/64 | HR 92 | Ht 70.0 in | Wt 287.0 lb

## 2018-09-20 DIAGNOSIS — Z8546 Personal history of malignant neoplasm of prostate: Secondary | ICD-10-CM | POA: Diagnosis not present

## 2018-09-20 DIAGNOSIS — Z823 Family history of stroke: Secondary | ICD-10-CM | POA: Diagnosis not present

## 2018-09-20 DIAGNOSIS — I110 Hypertensive heart disease with heart failure: Secondary | ICD-10-CM | POA: Insufficient documentation

## 2018-09-20 DIAGNOSIS — Z809 Family history of malignant neoplasm, unspecified: Secondary | ICD-10-CM | POA: Insufficient documentation

## 2018-09-20 DIAGNOSIS — I4892 Unspecified atrial flutter: Secondary | ICD-10-CM | POA: Insufficient documentation

## 2018-09-20 DIAGNOSIS — Z79899 Other long term (current) drug therapy: Secondary | ICD-10-CM | POA: Diagnosis not present

## 2018-09-20 DIAGNOSIS — I4819 Other persistent atrial fibrillation: Secondary | ICD-10-CM | POA: Diagnosis present

## 2018-09-20 DIAGNOSIS — G4733 Obstructive sleep apnea (adult) (pediatric): Secondary | ICD-10-CM | POA: Diagnosis not present

## 2018-09-20 DIAGNOSIS — Z9884 Bariatric surgery status: Secondary | ICD-10-CM | POA: Diagnosis not present

## 2018-09-20 DIAGNOSIS — F329 Major depressive disorder, single episode, unspecified: Secondary | ICD-10-CM | POA: Insufficient documentation

## 2018-09-20 DIAGNOSIS — Z7901 Long term (current) use of anticoagulants: Secondary | ICD-10-CM | POA: Insufficient documentation

## 2018-09-20 DIAGNOSIS — I5032 Chronic diastolic (congestive) heart failure: Secondary | ICD-10-CM | POA: Insufficient documentation

## 2018-09-20 LAB — COMPREHENSIVE METABOLIC PANEL
ALT: 28 U/L (ref 0–44)
AST: 18 U/L (ref 15–41)
Albumin: 3.3 g/dL — ABNORMAL LOW (ref 3.5–5.0)
Alkaline Phosphatase: 113 U/L (ref 38–126)
Anion gap: 13 (ref 5–15)
BUN: 30 mg/dL — ABNORMAL HIGH (ref 8–23)
CO2: 19 mmol/L — ABNORMAL LOW (ref 22–32)
Calcium: 8.7 mg/dL — ABNORMAL LOW (ref 8.9–10.3)
Chloride: 111 mmol/L (ref 98–111)
Creatinine, Ser: 1.09 mg/dL (ref 0.61–1.24)
GFR calc Af Amer: >60 mL/min (ref >60)
GFR calc non Af Amer: >60 mL/min (ref >60)
Glucose, Bld: 123 mg/dL — ABNORMAL HIGH (ref 70–99)
Potassium: 4.3 mmol/L (ref 3.5–5.1)
Sodium: 143 mmol/L (ref 135–145)
Total Bilirubin: 0.9 mg/dL (ref 0.3–1.2)
Total Protein: 6.3 g/dL — ABNORMAL LOW (ref 6.5–8.1)

## 2018-09-20 LAB — CBC
HCT: 37.7 % — ABNORMAL LOW (ref 39.0–52.0)
Hemoglobin: 11.7 g/dL — ABNORMAL LOW (ref 13.0–17.0)
MCH: 27.8 pg (ref 26.0–34.0)
MCHC: 31 g/dL (ref 30.0–36.0)
MCV: 89.5 fL (ref 80.0–100.0)
Platelets: 211 10*3/uL (ref 150–400)
RBC: 4.21 MIL/uL — ABNORMAL LOW (ref 4.22–5.81)
RDW: 15.1 % (ref 11.5–15.5)
WBC: 8.1 10*3/uL (ref 4.0–10.5)
nRBC: 0 % (ref 0.0–0.2)

## 2018-09-20 LAB — TSH: TSH: 3.332 u[IU]/mL (ref 0.350–4.500)

## 2018-09-20 NOTE — Progress Notes (Signed)
Primary Care Physician: [name], MD Referring Physician: Dr. Aretta Nip is a 78 y.o. male with a h/o paroxysmal afib and flutter that is in the afib clinic for  f/u ablation 05/2018.   He had continued on amiodarone 200 mg daily and had a successful cardioversion  12/11. He saw Dr. Rayann Heman at the 3 month ablation f/u and was in  Afib. He increased amiodarone to 200 mg bid and was asked to f/u here today. The plan was to cardiovert pt if he was still out of rhythm. EKG shows aflutter with variable block. He has tolerated fairly well but feels tired today.  Today, he denies symptoms of palpitations, chest pain, shortness of breath, orthopnea, PND, lower extremity edema, dizziness, presyncope, syncope, or neurologic sequela. The patient is tolerating medications without difficulties and is otherwise without complaint today.   Past Medical History:  Diagnosis Date  . Anemia 1980s X 1  . Arthritis    "hands, knees, hips, ankles" (07/31/2015)  . Atrial flutter ([place]) 07/31/2015  . Chronic diastolic heart failure ([place])    a. 11/2015: Echo w/ EF of 60-65%, no WMA, Grade 2 DD, trivial AR, ascending aorta mildly dilated.   . Depression   . History of cardiovascular stress test    a. 03/2015: Dobutamine Stress Echo with no evidence of ischemia.   [name] History of gastric bypass   . History of peptic ulcer    1980's  . History of radiation therapy 1993   sarcoma of left groin, tx at [facility]  . History of sarcoma    1993  LEFT GOIN--  S/P SURGERY, RADIATION AND CHEMO IN [place]  . Hypertension   . Hypogonadism in male   . Incomplete right bundle branch block   . 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 Nocturia   . OSA on CPAP   . Persistent atrial fibrillation       . 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 PVC's (premature ventricular  contractions) 11/21/2015  . 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 Weakness of left leg    SECONDARY TO SURGICAL NERVE INJURY OF LEFT GOIN  . Wears glasses    Past Surgical History:  Procedure Laterality Date  . ATRIAL FIBRILLATION ABLATION N/A 06/08/2018   Procedure: ATRIAL FIBRILLATION ABLATION;  Surgeon: Thompson Grayer, MD;  Location: [place] CV LAB;  Service: Cardiovascular;  Laterality: N/A;  . CARDIAC CATHETERIZATION  08-12-2003  dr Tressia Miners turner   Normal coronary arteries, normal wall motion, no sig. abnormalities  . CARDIOVERSION N/A 08/04/2015   Procedure: CARDIOVERSION;  Surgeon: [name], MD;  Location: [place];  Service: Cardiovascular;  Laterality: N/A;  . CARDIOVERSION N/A 10/28/2017   Procedure: CARDIOVERSION;  Surgeon: Larey Dresser, MD;  Location: [facility] ENDOSCOPY;  Service: Cardiovascular;  Laterality: N/A;  . CARDIOVERSION N/A 04/12/2018   Procedure: CARDIOVERSION;  Surgeon: Thayer Headings, MD;  Location: [facility] ENDOSCOPY;  Service: Cardiovascular;  Laterality: N/A;  . CARDIOVERSION N/A 07/26/2018   Procedure: CARDIOVERSION;  Surgeon: Larey Dresser, MD;  Location: [facility] ENDOSCOPY;  Service: Cardiovascular;  Laterality: N/A;  . COLONOSCOPY  07-03-2002  . [place]  . FRACTURE SURGERY    . GROIN DISSECTION Left 1992   [place]   SARCOMA SURGERY  . INGUINAL  HERNIA REPAIR Right 1950s?  . INTESTINAL BYPASS  1976   GASTRIC FOR OBESITY  . JOINT REPLACEMENT    . PATELLA FRACTURE SURGERY Left ~ 1995   "broke it twice; only had OR once"  . PROSTATE BIOPSY  06/2014  . RADIOACTIVE SEED IMPLANT N/A 10/17/2014   Procedure: RADIOACTIVE SEED IMPLANT    ;  Surgeon: Ailene Rud, MD;  Location: [facility];  Service: Urology;  Laterality: N/A;   68 SEEDS IMPLANTED   . TEE WITHOUT CARDIOVERSION N/A 10/28/2017   Procedure: TRANSESOPHAGEAL ECHOCARDIOGRAM (TEE);  Surgeon: Larey Dresser, MD;  Location: [facility] ENDOSCOPY;  Service:  Cardiovascular;  Laterality: N/A;  . TONSILLECTOMY  1950s  . TOTAL KNEE ARTHROPLASTY Right 12/17/2014   Procedure: TOTAL KNEE ARTHROPLASTY;  Surgeon: Melrose Nakayama, MD;  Location: [place];  Service: Orthopedics;  Laterality: Right;  . TRANSTHORACIC ECHOCARDIOGRAM  08-08-2003   moderate LVH/  ef 55-65%/  mild MR/  moderate LAE/  trivial TR/  trivial pericardial effusion posterior to the heart  . [place]  . VIDEO BRONCHOSCOPY Bilateral 09/21/2016   Procedure: VIDEO BRONCHOSCOPY WITHOUT FLUORO;  Surgeon: Collene Gobble, MD;  Location: [place];  Service: Cardiopulmonary;  Laterality: Bilateral;    Current Outpatient Medications  Medication Sig Dispense Refill  . acetaminophen (TYLENOL) 325 MG tablet Take 2 tablets (650 mg total) by mouth every 4 (four) hours as needed for headache or mild pain.    [name] albuterol (PROVENTIL HFA;VENTOLIN HFA) 108 (90 Base) MCG/ACT inhaler Inhale 2 puffs into the lungs every 6 (six) hours as needed for wheezing or shortness of breath.    [name] amiodarone (PACERONE) 200 MG tablet Take 1 tablet (200 mg total) by mouth 2 (two) times daily. 180 tablet 3  . bumetanide (BUMEX) 2 MG tablet TAKE 1 TABLET BY MOUTH DAILY AND AN EXTRA TABLET FOR WEIGHT GAIN 2 POUNDS DAILY OR 5 POUNDS WEEKLY 180 tablet 3  . buPROPion (WELLBUTRIN XL) 300 MG 24 hr tablet Take 300 mg by mouth every morning.    [name] CLOTRIMAZOLE EX Apply 1 application topically 2 (two) times daily as needed (for itching).    . Coenzyme Q10 (COQ10) 100 MG CAPS Take 100 mg by mouth every evening.     . cyanocobalamin (,VITAMIN B-12,) 1000 MCG/ML injection Inject 1,000 mcg into the muscle every 30 (thirty) days.    [name] diltiazem (TIAZAC) 180 MG 24 hr capsule Take 1 capsule (180 mg total) by mouth every morning. 14 capsule 1  . ELIQUIS 5 MG TABS tablet TAKE 1 TABLET BY MOUTH 2 TIMES DAILY. 180 tablet 1  . escitalopram (LEXAPRO) 10 MG tablet Take 10 mg by mouth daily.    [name] losartan (COZAAR) 25 MG tablet Take 1  tablet (25 mg total) by mouth daily. 90 tablet 3  . magnesium oxide (MAG-OX) 400 MG tablet Take 400 mg by mouth daily.    . metolazone (ZAROXOLYN) 2.5 MG tablet Take 2.5 mg by mouth daily as needed (for fluid retention).     . metoprolol tartrate (LOPRESSOR) 50 MG tablet Take 1 tablet (50 mg total) by mouth 2 (two) times daily. 180 tablet 3  . pantoprazole (PROTONIX) 40 MG tablet Take 1 tablet (40 mg total) by mouth daily. 45 tablet 0  . Polyethyl Glycol-Propyl Glycol (SYSTANE ULTRA) 0.4-0.3 % SOLN Place 1 drop into both eyes daily as needed (for dry eyes).    . potassium chloride SA (K-DUR,KLOR-CON) 20 MEQ tablet TAKE 2  TABLETS BY MOUTH DAILY. 180 tablet 0  . tamsulosin (FLOMAX) 0.4 MG CAPS capsule Take 0.8 mg by mouth at bedtime.      No current facility-administered medications for this encounter.     Allergies  Allergen Reactions  . Ace Inhibitors Cough  . Xarelto [Rivaroxaban] Other (See Comments)    Joint pain    Social History   Socioeconomic History  . Marital status: Married    Spouse name: Not on file  . Number of children: Not on file  . Years of education: Not on file  . Highest education level: Not on file  Occupational History  . Not on file  Social Needs  . Financial resource strain: Not on file  . Food insecurity:    Worry: Not on file    Inability: Not on file  . Transportation needs:    Medical: Not on file    Non-medical: Not on file  Tobacco Use  . Smoking status: Never Smoker  . Smokeless tobacco: Never Used  Substance and Sexual Activity  . Alcohol use: No  . Drug use: No  . Sexual activity: Not Currently  Lifestyle  . Physical activity:    Days per week: Not on file    Minutes per session: Not on file  . Stress: Not on file  Relationships  . Social connections:    Talks on phone: Not on file    Gets together: Not on file    Attends religious service: Not on file    Active member of club or organization: Not on file    Attends meetings of  clubs or organizations: Not on file    Relationship status: Not on file  . Intimate partner violence:    Fear of current or ex partner: Not on file    Emotionally abused: Not on file    Physically abused: Not on file    Forced sexual activity: Not on file  Other Topics Concern  . 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 Liver disease Mother   . Renal Disease Mother   . Stroke Mother   . Atrial fibrillation Father   . Cancer Sister     ROS- All systems are reviewed and negative except as per the HPI above  Physical Exam: Vitals:   09/20/18 1511  BP: 110/64  Pulse: 92  SpO2: 94%  Weight: 130.2 kg  Height: 5\' 10"  (1.778 m)   Wt Readings from Last 3 Encounters:  09/20/18 130.2 kg  09/13/18 131.7 kg  08/17/18 134.3 kg    Labs: Lab Results  Component Value Date   NA 140 07/19/2018   K 4.6 07/19/2018   CL 103 07/19/2018   CO2 20 07/19/2018   GLUCOSE 91 07/19/2018   BUN 29 (H) 07/19/2018   CREATININE 1.19 07/19/2018   CALCIUM 9.0 07/19/2018   MG 1.5 (L) 05/09/2018   Lab Results  Component Value Date   INR 1.32 08/03/2015   Lab Results  Component Value Date   CHOL 167 05/09/2018   HDL 46 05/09/2018   LDLCALC 88 05/09/2018   TRIG 164 (H) 05/09/2018     GEN- The patient is well appearing, alert and oriented x 3 today.   Head- normocephalic, atraumatic Eyes-  Sclera clear, conjunctiva pink Ears- hearing intact Oropharynx- clear Neck- supple,  no JVP Lymph- no cervical lymphadenopathy Lungs- Clear to ausculation bilaterally, normal work of breathing Heart- irregular rate and rhythm, no murmurs, rubs or gallops, PMI not laterally displaced GI- soft, NT, ND, + BS Extremities- no clubbing, cyanosis, or edema MS- no significant deformity or atrophy Skin- no rash or lesion Psych- euthymic mood, full affect Neuro-  strength and sensation are intact  EKG- atrial flutter at 92 bpm, qrs int 106 ms, qtc 469 ms   Assessment and Plan: 1. Afib/flutter  He remains in afib/flutter Per Dr. Jackalyn Lombard last note, if afib persists after increasing amiodarone to 200 mg bid,   the pt was to be set up for cardioversion Pt is in agreement Continue eliqus 5 mg bid for chadsvasc score of at least 4, states no missed doses x 3 weeks  Cmet/tsh/cbc  today  He will continue amiodarone at 200 mg bid until I see him back one week after cardioversion, then will reduce to 200 mg daily if  DCCV was successful  Butch Penny C. Dejean Tribby, [facility] [address] [place], [place] [address] [phone]

## 2018-09-20 NOTE — Progress Notes (Signed)
Pt in for EKG since increasing amiodarone to 200 bid.  To be reviewed by Ceasar Lund

## 2018-09-20 NOTE — Patient Instructions (Signed)
Cardioversion scheduled for Tuesday, February 11th  - Arrive at the [facility] and go to admitting at 1130AM  -Do not eat or drink anything after midnight the night prior to your procedure.  - Take all your morning medication with a sip of water prior to arrival.  - You will not be able to drive home after your procedure.

## 2018-09-20 NOTE — H&P (View-Only) (Signed)
Primary Care Physician: [name], MD Referring Physician: Dr. Aretta Nip is a 78 y.o. male with a h/o paroxysmal afib and flutter that is in the afib clinic for  f/u ablation 05/2018.   He had continued on amiodarone 200 mg daily and had a successful cardioversion  12/11. He saw Dr. Rayann Heman at the 3 month ablation f/u and was in  Afib. He increased amiodarone to 200 mg bid and was asked to f/u here today. The plan was to cardiovert pt if he was still out of rhythm. EKG shows aflutter with variable block. He has tolerated fairly well but feels tired today.  Today, he denies symptoms of palpitations, chest pain, shortness of breath, orthopnea, PND, lower extremity edema, dizziness, presyncope, syncope, or neurologic sequela. The patient is tolerating medications without difficulties and is otherwise without complaint today.   Past Medical History:  Diagnosis Date  . Anemia 1980s X 1  . Arthritis    "hands, knees, hips, ankles" (07/31/2015)  . Atrial flutter ([place]) 07/31/2015  . Chronic diastolic heart failure ([place])    a. 11/2015: Echo w/ EF of 60-65%, no WMA, Grade 2 DD, trivial AR, ascending aorta mildly dilated.   . Depression   . History of cardiovascular stress test    a. 03/2015: Dobutamine Stress Echo with no evidence of ischemia.   [name] History of gastric bypass   . History of peptic ulcer    1980's  . History of radiation therapy 1993   sarcoma of left groin, tx at [facility]  . History of sarcoma    1993  LEFT GOIN--  S/P SURGERY, RADIATION AND CHEMO IN [place]  . Hypertension   . Hypogonadism in male   . Incomplete right bundle branch block   . 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 Nocturia   . OSA on CPAP   . Persistent atrial fibrillation       . Primary prostate adenocarcinoma ([place]) DX 07/08/14   Gleason 7,  stage T1c  . PVC's (premature ventricular  contractions) 11/21/2015  . 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 Weakness of left leg    SECONDARY TO SURGICAL NERVE INJURY OF LEFT GOIN  . Wears glasses    Past Surgical History:  Procedure Laterality Date  . ATRIAL FIBRILLATION ABLATION N/A 06/08/2018   Procedure: ATRIAL FIBRILLATION ABLATION;  Surgeon: Thompson Grayer, MD;  Location: [place] CV LAB;  Service: Cardiovascular;  Laterality: N/A;  . CARDIAC CATHETERIZATION  08-12-2003  dr Tressia Miners turner   Normal coronary arteries, normal wall motion, no sig. abnormalities  . CARDIOVERSION N/A 08/04/2015   Procedure: CARDIOVERSION;  Surgeon: [name], MD;  Location: [place];  Service: Cardiovascular;  Laterality: N/A;  . CARDIOVERSION N/A 10/28/2017   Procedure: CARDIOVERSION;  Surgeon: Larey Dresser, MD;  Location: [facility] ENDOSCOPY;  Service: Cardiovascular;  Laterality: N/A;  . CARDIOVERSION N/A 04/12/2018   Procedure: CARDIOVERSION;  Surgeon: Thayer Headings, MD;  Location: [facility] ENDOSCOPY;  Service: Cardiovascular;  Laterality: N/A;  . CARDIOVERSION N/A 07/26/2018   Procedure: CARDIOVERSION;  Surgeon: Larey Dresser, MD;  Location: [facility] ENDOSCOPY;  Service: Cardiovascular;  Laterality: N/A;  . COLONOSCOPY  07-03-2002  . [place]  . FRACTURE SURGERY    . GROIN DISSECTION Left 1992   [place]   SARCOMA SURGERY  . INGUINAL  HERNIA REPAIR Right 1950s?  . INTESTINAL BYPASS  1976   GASTRIC FOR OBESITY  . JOINT REPLACEMENT    . PATELLA FRACTURE SURGERY Left ~ 1995   "broke it twice; only had OR once"  . PROSTATE BIOPSY  06/2014  . RADIOACTIVE SEED IMPLANT N/A 10/17/2014   Procedure: RADIOACTIVE SEED IMPLANT    ;  Surgeon: Ailene Rud, MD;  Location: [facility];  Service: Urology;  Laterality: N/A;   68 SEEDS IMPLANTED   . TEE WITHOUT CARDIOVERSION N/A 10/28/2017   Procedure: TRANSESOPHAGEAL ECHOCARDIOGRAM (TEE);  Surgeon: Larey Dresser, MD;  Location: [facility] ENDOSCOPY;  Service:  Cardiovascular;  Laterality: N/A;  . TONSILLECTOMY  1950s  . TOTAL KNEE ARTHROPLASTY Right 12/17/2014   Procedure: TOTAL KNEE ARTHROPLASTY;  Surgeon: Melrose Nakayama, MD;  Location: [place];  Service: Orthopedics;  Laterality: Right;  . TRANSTHORACIC ECHOCARDIOGRAM  08-08-2003   moderate LVH/  ef 55-65%/  mild MR/  moderate LAE/  trivial TR/  trivial pericardial effusion posterior to the heart  . [place]  . VIDEO BRONCHOSCOPY Bilateral 09/21/2016   Procedure: VIDEO BRONCHOSCOPY WITHOUT FLUORO;  Surgeon: Collene Gobble, MD;  Location: [place];  Service: Cardiopulmonary;  Laterality: Bilateral;    Current Outpatient Medications  Medication Sig Dispense Refill  . acetaminophen (TYLENOL) 325 MG tablet Take 2 tablets (650 mg total) by mouth every 4 (four) hours as needed for headache or mild pain.    [name] albuterol (PROVENTIL HFA;VENTOLIN HFA) 108 (90 Base) MCG/ACT inhaler Inhale 2 puffs into the lungs every 6 (six) hours as needed for wheezing or shortness of breath.    [name] amiodarone (PACERONE) 200 MG tablet Take 1 tablet (200 mg total) by mouth 2 (two) times daily. 180 tablet 3  . bumetanide (BUMEX) 2 MG tablet TAKE 1 TABLET BY MOUTH DAILY AND AN EXTRA TABLET FOR WEIGHT GAIN 2 POUNDS DAILY OR 5 POUNDS WEEKLY 180 tablet 3  . buPROPion (WELLBUTRIN XL) 300 MG 24 hr tablet Take 300 mg by mouth every morning.    [name] CLOTRIMAZOLE EX Apply 1 application topically 2 (two) times daily as needed (for itching).    . Coenzyme Q10 (COQ10) 100 MG CAPS Take 100 mg by mouth every evening.     . cyanocobalamin (,VITAMIN B-12,) 1000 MCG/ML injection Inject 1,000 mcg into the muscle every 30 (thirty) days.    [name] diltiazem (TIAZAC) 180 MG 24 hr capsule Take 1 capsule (180 mg total) by mouth every morning. 14 capsule 1  . ELIQUIS 5 MG TABS tablet TAKE 1 TABLET BY MOUTH 2 TIMES DAILY. 180 tablet 1  . escitalopram (LEXAPRO) 10 MG tablet Take 10 mg by mouth daily.    [name] losartan (COZAAR) 25 MG tablet Take 1  tablet (25 mg total) by mouth daily. 90 tablet 3  . magnesium oxide (MAG-OX) 400 MG tablet Take 400 mg by mouth daily.    . metolazone (ZAROXOLYN) 2.5 MG tablet Take 2.5 mg by mouth daily as needed (for fluid retention).     . metoprolol tartrate (LOPRESSOR) 50 MG tablet Take 1 tablet (50 mg total) by mouth 2 (two) times daily. 180 tablet 3  . pantoprazole (PROTONIX) 40 MG tablet Take 1 tablet (40 mg total) by mouth daily. 45 tablet 0  . Polyethyl Glycol-Propyl Glycol (SYSTANE ULTRA) 0.4-0.3 % SOLN Place 1 drop into both eyes daily as needed (for dry eyes).    . potassium chloride SA (K-DUR,KLOR-CON) 20 MEQ tablet TAKE 2  TABLETS BY MOUTH DAILY. 180 tablet 0  . tamsulosin (FLOMAX) 0.4 MG CAPS capsule Take 0.8 mg by mouth at bedtime.      No current facility-administered medications for this encounter.     Allergies  Allergen Reactions  . Ace Inhibitors Cough  . Xarelto [Rivaroxaban] Other (See Comments)    Joint pain    Social History   Socioeconomic History  . Marital status: Married    Spouse name: Not on file  . Number of children: Not on file  . Years of education: Not on file  . Highest education level: Not on file  Occupational History  . Not on file  Social Needs  . Financial resource strain: Not on file  . Food insecurity:    Worry: Not on file    Inability: Not on file  . Transportation needs:    Medical: Not on file    Non-medical: Not on file  Tobacco Use  . Smoking status: Never Smoker  . Smokeless tobacco: Never Used  Substance and Sexual Activity  . Alcohol use: No  . Drug use: No  . Sexual activity: Not Currently  Lifestyle  . Physical activity:    Days per week: Not on file    Minutes per session: Not on file  . Stress: Not on file  Relationships  . Social connections:    Talks on phone: Not on file    Gets together: Not on file    Attends religious service: Not on file    Active member of club or organization: Not on file    Attends meetings of  clubs or organizations: Not on file    Relationship status: Not on file  . Intimate partner violence:    Fear of current or ex partner: Not on file    Emotionally abused: Not on file    Physically abused: Not on file    Forced sexual activity: Not on file  Other Topics Concern  . 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 Liver disease Mother   . Renal Disease Mother   . Stroke Mother   . Atrial fibrillation Father   . Cancer Sister     ROS- All systems are reviewed and negative except as per the HPI above  Physical Exam: Vitals:   09/20/18 1511  BP: 110/64  Pulse: 92  SpO2: 94%  Weight: 130.2 kg  Height: 5\' 10"  (1.778 m)   Wt Readings from Last 3 Encounters:  09/20/18 130.2 kg  09/13/18 131.7 kg  08/17/18 134.3 kg    Labs: Lab Results  Component Value Date   NA 140 07/19/2018   K 4.6 07/19/2018   CL 103 07/19/2018   CO2 20 07/19/2018   GLUCOSE 91 07/19/2018   BUN 29 (H) 07/19/2018   CREATININE 1.19 07/19/2018   CALCIUM 9.0 07/19/2018   MG 1.5 (L) 05/09/2018   Lab Results  Component Value Date   INR 1.32 08/03/2015   Lab Results  Component Value Date   CHOL 167 05/09/2018   HDL 46 05/09/2018   LDLCALC 88 05/09/2018   TRIG 164 (H) 05/09/2018     GEN- The patient is well appearing, alert and oriented x 3 today.   Head- normocephalic, atraumatic Eyes-  Sclera clear, conjunctiva pink Ears- hearing intact Oropharynx- clear Neck- supple,  no JVP Lymph- no cervical lymphadenopathy Lungs- Clear to ausculation bilaterally, normal work of breathing Heart- irregular rate and rhythm, no murmurs, rubs or gallops, PMI not laterally displaced GI- soft, NT, ND, + BS Extremities- no clubbing, cyanosis, or edema MS- no significant deformity or atrophy Skin- no rash or lesion Psych- euthymic mood, full affect Neuro-  strength and sensation are intact  EKG- atrial flutter at 92 bpm, qrs int 106 ms, qtc 469 ms   Assessment and Plan: 1. Afib/flutter  He remains in afib/flutter Per Dr. Jackalyn Lombard last note, if afib persists after increasing amiodarone to 200 mg bid,   the pt was to be set up for cardioversion Pt is in agreement Continue eliqus 5 mg bid for chadsvasc score of at least 4, states no missed doses x 3 weeks  Cmet/tsh/cbc  today  He will continue amiodarone at 200 mg bid until I see him back one week after cardioversion, then will reduce to 200 mg daily if  DCCV was successful  Eric Mayer, [facility] [address] [place],  [address] [phone]

## 2018-09-25 ENCOUNTER — Encounter (HOSPITAL_COMMUNITY): Payer: Self-pay

## 2018-09-25 ENCOUNTER — Telehealth: Payer: Self-pay | Admitting: Cardiovascular Disease

## 2018-09-25 ENCOUNTER — Other Ambulatory Visit: Payer: Self-pay | Admitting: Nurse Practitioner

## 2018-09-25 ENCOUNTER — Ambulatory Visit
Admission: RE | Admit: 2018-09-25 | Discharge: 2018-09-25 | Disposition: A | Discharge status: Home / Self Care | Payer: BC Managed Care – PPO | Source: Ambulatory Visit | Attending: Nurse Practitioner | Admitting: Nurse Practitioner

## 2018-09-25 DIAGNOSIS — I5030 Unspecified diastolic (congestive) heart failure: Secondary | ICD-10-CM

## 2018-09-25 NOTE — Telephone Encounter (Signed)
Spoke with patient and he has a productive cough with green sputum and blowing out greenish sputum. Has had the cough for about a week or more but started getting worse on Friday. Denies any fever. Advised patient ok to take plain Mucinex but needed to call PCP to see about getting evaluated. Patient to call back with update after speaks with PCP

## 2018-09-25 NOTE — Telephone Encounter (Signed)
Patient called back in states PCP did start antibiotics for "infection". Per Roderic Palau Np - will hold off on cardioversion until off antibiotics and recovered. Pt will call when better. Pt in agreement with plan.

## 2018-09-25 NOTE — Telephone Encounter (Signed)
New message   Patient wants to know if he can take mucinex before his cardioversion on 09/26/18. Please call to discuss.

## 2018-09-26 ENCOUNTER — Encounter (HOSPITAL_COMMUNITY): Admission: RE | Payer: Self-pay | Source: Home / Self Care

## 2018-09-26 ENCOUNTER — Ambulatory Visit (HOSPITAL_COMMUNITY): Admission: RE | Admit: 2018-09-26 | Payer: Medicare Other | Source: Home / Self Care | Admitting: Cardiology

## 2018-09-26 SURGERY — CARDIOVERSION
Anesthesia: General

## 2018-10-02 ENCOUNTER — Telehealth (HOSPITAL_COMMUNITY): Payer: Self-pay | Admitting: *Deleted

## 2018-10-02 ENCOUNTER — Other Ambulatory Visit (HOSPITAL_COMMUNITY): Payer: Self-pay | Admitting: *Deleted

## 2018-10-02 NOTE — Telephone Encounter (Signed)
Patient cardioversion rescheduled after sickness for 2/24. Pt instructions reviewed. Patient had CBC on 2/12 with Dr. Laurann [place] at [facility]  WBC 6.8 RBC 4.28 HGB 12.1 HCT 37.8 MCV 88.2 MCH 28.3 MCHC 32.0 RDW 16.6 PLT 191  Pt to come by this week for repeat BMET.

## 2018-10-03 ENCOUNTER — Ambulatory Visit (HOSPITAL_COMMUNITY)
Admission: RE | Admit: 2018-10-03 | Discharge: 2018-10-03 | Disposition: A | Discharge status: Home / Self Care | Payer: Medicare Other | Source: Ambulatory Visit | Attending: Nurse Practitioner | Admitting: Nurse Practitioner

## 2018-10-03 DIAGNOSIS — Z881 Allergy status to other antibiotic agents status: Secondary | ICD-10-CM | POA: Insufficient documentation

## 2018-10-03 DIAGNOSIS — I4510 Unspecified right bundle-branch block: Secondary | ICD-10-CM | POA: Insufficient documentation

## 2018-10-03 DIAGNOSIS — Z8546 Personal history of malignant neoplasm of prostate: Secondary | ICD-10-CM | POA: Diagnosis not present

## 2018-10-03 DIAGNOSIS — Z809 Family history of malignant neoplasm, unspecified: Secondary | ICD-10-CM | POA: Diagnosis not present

## 2018-10-03 DIAGNOSIS — I5032 Chronic diastolic (congestive) heart failure: Secondary | ICD-10-CM | POA: Insufficient documentation

## 2018-10-03 DIAGNOSIS — I4819 Other persistent atrial fibrillation: Secondary | ICD-10-CM | POA: Insufficient documentation

## 2018-10-03 DIAGNOSIS — Z7901 Long term (current) use of anticoagulants: Secondary | ICD-10-CM | POA: Insufficient documentation

## 2018-10-03 DIAGNOSIS — Z79899 Other long term (current) drug therapy: Secondary | ICD-10-CM | POA: Insufficient documentation

## 2018-10-03 DIAGNOSIS — G4733 Obstructive sleep apnea (adult) (pediatric): Secondary | ICD-10-CM | POA: Diagnosis not present

## 2018-10-03 DIAGNOSIS — Z8249 Family history of ischemic heart disease and other diseases of the circulatory system: Secondary | ICD-10-CM | POA: Insufficient documentation

## 2018-10-03 DIAGNOSIS — I110 Hypertensive heart disease with heart failure: Secondary | ICD-10-CM | POA: Insufficient documentation

## 2018-10-03 DIAGNOSIS — F329 Major depressive disorder, single episode, unspecified: Secondary | ICD-10-CM | POA: Insufficient documentation

## 2018-10-03 DIAGNOSIS — Z96651 Presence of right artificial knee joint: Secondary | ICD-10-CM | POA: Diagnosis not present

## 2018-10-03 DIAGNOSIS — I4892 Unspecified atrial flutter: Secondary | ICD-10-CM | POA: Insufficient documentation

## 2018-10-03 DIAGNOSIS — Z888 Allergy status to other drugs, medicaments and biological substances status: Secondary | ICD-10-CM | POA: Insufficient documentation

## 2018-10-03 LAB — BASIC METABOLIC PANEL
Anion gap: 8 (ref 5–15)
BUN: 22 mg/dL (ref 8–23)
CO2: 25 mmol/L (ref 22–32)
Calcium: 8.9 mg/dL (ref 8.9–10.3)
Chloride: 110 mmol/L (ref 98–111)
Creatinine, Ser: 1 mg/dL (ref 0.61–1.24)
GFR calc Af Amer: >60 mL/min (ref >60)
GFR calc non Af Amer: >60 mL/min (ref >60)
Glucose, Bld: 118 mg/dL — ABNORMAL HIGH (ref 70–99)
Potassium: 4.5 mmol/L (ref 3.5–5.1)
Sodium: 143 mmol/L (ref 135–145)

## 2018-10-06 ENCOUNTER — Other Ambulatory Visit (HOSPITAL_COMMUNITY): Payer: Self-pay | Admitting: *Deleted

## 2018-10-09 ENCOUNTER — Encounter (HOSPITAL_COMMUNITY): Payer: Self-pay | Admitting: *Deleted

## 2018-10-09 ENCOUNTER — Other Ambulatory Visit: Payer: Self-pay

## 2018-10-09 ENCOUNTER — Ambulatory Visit (HOSPITAL_COMMUNITY): Payer: Medicare Other | Admitting: Anesthesiology

## 2018-10-09 ENCOUNTER — Ambulatory Visit (HOSPITAL_COMMUNITY)
Admission: RE | Admit: 2018-10-09 | Discharge: 2018-10-09 | Disposition: A | Discharge status: Home / Self Care | Payer: Medicare Other | Attending: Cardiology | Admitting: Cardiology

## 2018-10-09 ENCOUNTER — Encounter (HOSPITAL_COMMUNITY)
Admission: RE | Disposition: A | Discharge status: Home / Self Care | Payer: Self-pay | Source: Home / Self Care | Attending: Cardiology

## 2018-10-09 DIAGNOSIS — I480 Paroxysmal atrial fibrillation: Secondary | ICD-10-CM | POA: Diagnosis not present

## 2018-10-09 DIAGNOSIS — Z7901 Long term (current) use of anticoagulants: Secondary | ICD-10-CM | POA: Diagnosis not present

## 2018-10-09 DIAGNOSIS — G4733 Obstructive sleep apnea (adult) (pediatric): Secondary | ICD-10-CM | POA: Diagnosis not present

## 2018-10-09 DIAGNOSIS — I5032 Chronic diastolic (congestive) heart failure: Secondary | ICD-10-CM | POA: Insufficient documentation

## 2018-10-09 DIAGNOSIS — Z9221 Personal history of antineoplastic chemotherapy: Secondary | ICD-10-CM | POA: Insufficient documentation

## 2018-10-09 DIAGNOSIS — F329 Major depressive disorder, single episode, unspecified: Secondary | ICD-10-CM | POA: Diagnosis not present

## 2018-10-09 DIAGNOSIS — Z79899 Other long term (current) drug therapy: Secondary | ICD-10-CM | POA: Diagnosis not present

## 2018-10-09 DIAGNOSIS — I110 Hypertensive heart disease with heart failure: Secondary | ICD-10-CM | POA: Insufficient documentation

## 2018-10-09 DIAGNOSIS — I4819 Other persistent atrial fibrillation: Secondary | ICD-10-CM | POA: Diagnosis not present

## 2018-10-09 DIAGNOSIS — Z923 Personal history of irradiation: Secondary | ICD-10-CM | POA: Diagnosis not present

## 2018-10-09 DIAGNOSIS — Z8546 Personal history of malignant neoplasm of prostate: Secondary | ICD-10-CM | POA: Diagnosis not present

## 2018-10-09 HISTORY — PX: CARDIOVERSION: SHX1299

## 2018-10-09 SURGERY — CARDIOVERSION
Anesthesia: General

## 2018-10-09 MED ORDER — SODIUM CHLORIDE 0.9 % IV SOLN
INTRAVENOUS | Status: DC
  Administered 2018-10-09: 08:00:00 via INTRAVENOUS

## 2018-10-09 MED ORDER — PROPOFOL 10 MG/ML IV BOLUS
INTRAVENOUS | Status: DC | PRN
  Administered 2018-10-09: 20 mg via INTRAVENOUS
  Administered 2018-10-09: 40 mg via INTRAVENOUS

## 2018-10-09 MED ORDER — APIXABAN 5 MG PO TABS: 5.0000 mg | ORAL_TABLET | Freq: Two times a day (BID) | ORAL | Status: DC

## 2018-10-09 MED ORDER — POTASSIUM CHLORIDE CRYS ER 20 MEQ PO TBCR: 20.0000 meq | EXTENDED_RELEASE_TABLET | ORAL | Status: DC

## 2018-10-09 MED ORDER — BUMETANIDE 2 MG PO TABS: 1.0000 mg | ORAL_TABLET | Freq: Every day | ORAL | Status: DC

## 2018-10-09 NOTE — CV Procedure (Signed)
   Electrical Cardioversion Procedure Note GRASON BRAILSFORD [id number] 01-05-1941  Procedure: Electrical Cardioversion Indications:  Atrial Fibrillation  Time Out: Verified patient identification, verified procedure,medications/allergies/relevent history reviewed, required imaging and test results available.  Performed  Procedure Details  The patient was NPO after midnight. Anesthesia was administered at the beside  by Dr.Moser with 60mg  of propofol.  Cardioversion was done with synchronized biphasic defibrillation with AP pads with 150watts.  The patient converted to normal sinus rhythm. The patient tolerated the procedure well   IMPRESSION:  Successful cardioversion of atrial fibrillation    Eric Mayer 10/09/2018, 8:15 AM

## 2018-10-09 NOTE — Anesthesia Procedure Notes (Signed)
Procedure Name: General with mask airway Date/Time: 10/09/2018 8:29 AM Performed by: Kyung Rudd, CRNA Pre-anesthesia Checklist: Patient identified, Emergency Drugs available, Suction available, Patient being monitored and Timeout performed Patient Re-evaluated:Patient Re-evaluated prior to induction Oxygen Delivery Method: Ambu bag Preoxygenation: Pre-oxygenation with 100% oxygen Induction Type: IV induction Ventilation: Mask ventilation without difficulty Dental Injury: Teeth and Oropharynx as per pre-operative assessment

## 2018-10-09 NOTE — Interval H&P Note (Signed)
History and Physical Interval Note:  10/09/2018 8:15 AM  Eric Mayer  has presented today for surgery, with the diagnosis of a fib  The various methods of treatment have been discussed with the patient and family. After consideration of risks, benefits and other options for treatment, the patient has consented to  Procedure(s): CARDIOVERSION (N/A) as a surgical intervention .  The patient's history has been reviewed, patient examined, no change in status, stable for surgery.  I have reviewed the patient's chart and labs.  Questions were answered to the patient's satisfaction.     [name]

## 2018-10-09 NOTE — Anesthesia Preprocedure Evaluation (Addendum)
Anesthesia Evaluation  Patient identified by MRN, date of birth, ID band Patient awake    Reviewed: Allergy & Precautions, NPO status , Patient's Chart, lab work & pertinent test results  Airway Mallampati: II  TM Distance: >3 FB Neck ROM: Full    Dental  (+) Teeth Intact, Dental Advisory Given   Pulmonary asthma , sleep apnea ,    breath sounds clear to auscultation       Cardiovascular hypertension, Pt. on medications and Pt. on home beta blockers +CHF  + dysrhythmias Atrial Fibrillation  Rhythm:Irregular     Neuro/Psych PSYCHIATRIC DISORDERS Depression    GI/Hepatic negative GI ROS, Neg liver ROS,   Endo/Other  Morbid obesity  Renal/GU negative Renal ROS     Musculoskeletal  (+) Arthritis ,   Abdominal   Peds  Hematology  (+) anemia ,   Anesthesia Other Findings   Reproductive/Obstetrics                            Anesthesia Physical Anesthesia Plan  ASA: III  Anesthesia Plan: General   Post-op Pain Management:    Induction: Intravenous  PONV Risk Score and Plan: 2 and Treatment may vary due to age or medical condition  Airway Management Planned: Natural Airway and Mask  Additional Equipment: None  Intra-op Plan:   Post-operative Plan:   Informed Consent: I have reviewed the patients History and Physical, chart, labs and discussed the procedure including the risks, benefits and alternatives for the proposed anesthesia with the patient or authorized representative who has indicated his/her understanding and acceptance.     Dental advisory given  Plan Discussed with: CRNA and Anesthesiologist  Anesthesia Plan Comments:        Anesthesia Quick Evaluation

## 2018-10-09 NOTE — Discharge Instructions (Signed)
Electrical Cardioversion, Care After °This sheet gives you information about how to care for yourself after your procedure. Your health care provider may also give you more specific instructions. If you have problems or questions, contact your health care provider. °What can I expect after the procedure? °After the procedure, it is common to have: °· Some redness on the skin where the shocks were given. °Follow these instructions at home: ° °· Do not drive for 24 hours if you were given a medicine to help you relax (sedative). °· Take over-the-counter and prescription medicines only as told by your health care provider. °· Ask your health care provider how to check your pulse. Check it often. °· Rest for 48 hours after the procedure or as told by your health care provider. °· Avoid or limit your caffeine use as told by your health care provider. °Contact a health care provider if: °· You feel like your heart is beating too quickly or your pulse is not regular. °· You have a serious muscle cramp that does not go away. °Get help right away if: ° °· You have discomfort in your chest. °· You are dizzy or you feel faint. °· You have trouble breathing or you are short of breath. °· Your speech is slurred. °· You have trouble moving an arm or leg on one side of your body. °· Your fingers or toes turn cold or blue. °This information is not intended to replace advice given to you by your health care provider. Make sure you discuss any questions you have with your health care provider. °Document Released: 05/23/2013 Document Revised: 03/05/2016 Document Reviewed: 02/06/2016 °Elsevier Interactive Patient Education © 2019 [employer] ° °

## 2018-10-09 NOTE — Transfer of Care (Signed)
Immediate Anesthesia Transfer of Care Note  Patient: Eric Mayer  Procedure(s) Performed: CARDIOVERSION (N/A )  Patient Location: Endoscopy Unit  Anesthesia Type:General  Level of Consciousness: awake, alert  and oriented  Airway & Oxygen Therapy: Patient Spontanous Breathing and Patient connected to nasal cannula oxygen  Post-op Assessment: Report given to RN, Post -op Vital signs reviewed and stable and Patient moving all extremities X 4  Post vital signs: Reviewed and stable  Last Vitals:  Vitals Value Taken Time  BP    Temp    Pulse    Resp    SpO2      Last Pain:  Vitals:   10/09/18 0738  TempSrc: Oral  PainSc: 0-No pain         Complications: No apparent anesthesia complications

## 2018-10-10 ENCOUNTER — Ambulatory Visit (HOSPITAL_COMMUNITY): Payer: BC Managed Care – PPO | Admitting: Nurse Practitioner

## 2018-10-10 NOTE — Anesthesia Postprocedure Evaluation (Signed)
Anesthesia Post Note  Patient: Eric Mayer  Procedure(s) Performed: CARDIOVERSION (N/A )     Patient location during evaluation: Endoscopy Anesthesia Type: General Level of consciousness: awake and alert Pain management: pain level controlled Vital Signs Assessment: post-procedure vital signs reviewed and stable Respiratory status: spontaneous breathing, nonlabored ventilation, respiratory function stable and patient connected to nasal cannula oxygen Cardiovascular status: stable Postop Assessment: no apparent nausea or vomiting Anesthetic complications: no    Last Vitals:  Vitals:   10/09/18 0849 10/09/18 0855  BP: (!) 122/53   Pulse: 62 65  Resp: 17 13  Temp:    SpO2: 97% 97%    Last Pain:  Vitals:   10/10/18 1455  TempSrc:   PainSc: 0-No pain                 [name]

## 2018-10-18 ENCOUNTER — Encounter (HOSPITAL_COMMUNITY): Payer: Self-pay | Admitting: Nurse Practitioner

## 2018-10-18 ENCOUNTER — Ambulatory Visit (HOSPITAL_COMMUNITY)
Admission: RE | Admit: 2018-10-18 | Discharge: 2018-10-18 | Disposition: A | Discharge status: Home / Self Care | Payer: Medicare Other | Source: Ambulatory Visit | Attending: Nurse Practitioner | Admitting: Nurse Practitioner

## 2018-10-18 VITALS — BP 120/62 | HR 82 | Ht 70.0 in | Wt 293.0 lb

## 2018-10-18 DIAGNOSIS — Z79899 Other long term (current) drug therapy: Secondary | ICD-10-CM | POA: Diagnosis not present

## 2018-10-18 DIAGNOSIS — M1990 Unspecified osteoarthritis, unspecified site: Secondary | ICD-10-CM | POA: Insufficient documentation

## 2018-10-18 DIAGNOSIS — I480 Paroxysmal atrial fibrillation: Secondary | ICD-10-CM | POA: Insufficient documentation

## 2018-10-18 DIAGNOSIS — R531 Weakness: Secondary | ICD-10-CM | POA: Insufficient documentation

## 2018-10-18 DIAGNOSIS — I493 Ventricular premature depolarization: Secondary | ICD-10-CM | POA: Diagnosis not present

## 2018-10-18 DIAGNOSIS — Z7901 Long term (current) use of anticoagulants: Secondary | ICD-10-CM | POA: Diagnosis not present

## 2018-10-18 DIAGNOSIS — G4733 Obstructive sleep apnea (adult) (pediatric): Secondary | ICD-10-CM | POA: Insufficient documentation

## 2018-10-18 DIAGNOSIS — F329 Major depressive disorder, single episode, unspecified: Secondary | ICD-10-CM | POA: Diagnosis not present

## 2018-10-18 DIAGNOSIS — I5032 Chronic diastolic (congestive) heart failure: Secondary | ICD-10-CM | POA: Diagnosis not present

## 2018-10-18 DIAGNOSIS — I4819 Other persistent atrial fibrillation: Secondary | ICD-10-CM | POA: Diagnosis not present

## 2018-10-18 DIAGNOSIS — I4892 Unspecified atrial flutter: Secondary | ICD-10-CM | POA: Insufficient documentation

## 2018-10-18 DIAGNOSIS — I4510 Unspecified right bundle-branch block: Secondary | ICD-10-CM | POA: Insufficient documentation

## 2018-10-18 DIAGNOSIS — D649 Anemia, unspecified: Secondary | ICD-10-CM | POA: Diagnosis not present

## 2018-10-18 MED ORDER — AMIODARONE HCL 200 MG PO TABS: 200.0000 mg | ORAL_TABLET | Freq: Every day | ORAL | 3 refills | Status: DC

## 2018-10-18 NOTE — Progress Notes (Signed)
Primary Care Physician: [name], MD Referring Physician: Dr. Aretta Nip is a 78 y.o. male with a h/o paroxysmal afib and flutter that is in the afib clinic for  f/u ablation 05/2018.   He had continued on amiodarone 200 mg daily and had a successful cardioversion  12/11. He saw Dr. Rayann Heman at the 3 month ablation f/u and was in  Afib. He increased amiodarone to 200 mg bid and was asked to f/u here today. The plan was to cardiovert pt if he was still out of rhythm. EKG shows aflutter with variable block. He has tolerated fairly well but feels tired today.  He is back in afib clinic 10/18/18, following successful cardioversion. He got sick and his original cardioversion was delayed until  2/24. He remains in SR with pac's/pvc's. He feels much improved. He continues on amiodarone 2x a day.  Today, he denies symptoms of palpitations, chest pain, shortness of breath, orthopnea, PND, lower extremity edema, dizziness, presyncope, syncope, or neurologic sequela. The patient is tolerating medications without difficulties and is otherwise without complaint today.   Past Medical History:  Diagnosis Date  . Anemia 1980s X 1  . Arthritis    "hands, knees, hips, ankles" (07/31/2015)  . Atrial flutter ([place]) 07/31/2015  . Chronic diastolic heart failure ([place])    a. 11/2015: Echo w/ EF of 60-65%, no WMA, Grade 2 DD, trivial AR, ascending aorta mildly dilated.   . Depression   . History of cardiovascular stress test    a. 03/2015: Dobutamine Stress Echo with no evidence of ischemia.   [name] History of gastric bypass   . History of peptic ulcer    1980's  . History of radiation therapy 1993   sarcoma of left groin, [facility]  . History of sarcoma    1993  LEFT GOIN--  S/P SURGERY, RADIATION AND CHEMO IN [place]  . Hypertension   . Hypogonadism in male   . Incomplete right bundle branch block   . Nerve injury    SURGICAL NERVE INJURY S/P  LEFT GOIN REMOVAL SARCOMA 1992--   RESIDUAL WEAKNESS AND NUMBNESS UPPER LEFT LEG  . Nocturia   . OSA on CPAP   . Persistent atrial fibrillation       . 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 PVC's (premature ventricular contractions) 11/21/2015  . 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 Weakness of left leg    SECONDARY TO SURGICAL NERVE INJURY OF LEFT GOIN  . Wears glasses    Past Surgical History:  Procedure Laterality Date  . ATRIAL FIBRILLATION ABLATION N/A 06/08/2018   Procedure: ATRIAL FIBRILLATION ABLATION;  Surgeon: Thompson Grayer, MD;  Location: [place] [facility];  Service: Cardiovascular;  Laterality: N/A;  . CARDIAC CATHETERIZATION  08-12-2003  dr Tressia Miners turner   Normal coronary arteries, normal wall motion, no sig. abnormalities  . CARDIOVERSION N/A 08/04/2015   Procedure: CARDIOVERSION;  Surgeon: [name], MD;  Location: [place];  Service: Cardiovascular;  Laterality: N/A;  . CARDIOVERSION N/A 10/28/2017   Procedure: CARDIOVERSION;  Surgeon: Larey Dresser, MD;  Location: [facility] ENDOSCOPY;  Service: Cardiovascular;  Laterality: N/A;  . CARDIOVERSION N/A 04/12/2018   Procedure: CARDIOVERSION;  Surgeon: Thayer Headings, MD;  Location: [facility] ENDOSCOPY;  Service: Cardiovascular;  Laterality: N/A;  . CARDIOVERSION N/A 07/26/2018   Procedure: CARDIOVERSION;  Surgeon: Larey Dresser, MD;  Location: [facility] ENDOSCOPY;  Service: Cardiovascular;  Laterality: N/A;  . CARDIOVERSION N/A 10/09/2018   Procedure: CARDIOVERSION;  Surgeon: Sueanne Margarita, MD;  Location: [facility] ENDOSCOPY;  Service: Cardiovascular;  Laterality: N/A;  . COLONOSCOPY  07-03-2002  . [address]  . FRACTURE SURGERY    . GROIN DISSECTION Left 1992   [place]   SARCOMA SURGERY  . INGUINAL HERNIA REPAIR Right 1950s?  . INTESTINAL BYPASS  1976   GASTRIC FOR OBESITY  . JOINT REPLACEMENT    . PATELLA FRACTURE SURGERY Left ~ 1995   "broke it twice; only had OR once"  . PROSTATE BIOPSY   06/2014  . RADIOACTIVE SEED IMPLANT N/A 10/17/2014   Procedure: RADIOACTIVE SEED IMPLANT    ;  Surgeon: Ailene Rud, MD;  Location: [facility];  Service: Urology;  Laterality: N/A;   68 SEEDS IMPLANTED   . TEE WITHOUT CARDIOVERSION N/A 10/28/2017   Procedure: TRANSESOPHAGEAL ECHOCARDIOGRAM (TEE);  Surgeon: Larey Dresser, MD;  Location: [facility]OSCOPY;  Service: Cardiovascular;  Laterality: N/A;  . TONSILLECTOMY  1950s  . TOTAL KNEE ARTHROPLASTY Right 12/17/2014   Procedure: TOTAL KNEE ARTHROPLASTY;  Surgeon: Melrose Nakayama, MD;  Location: [place];  Service: Orthopedics;  Laterality: Right;  . TRANSTHORACIC ECHOCARDIOGRAM  08-08-2003   moderate LVH/  ef 55-65%/  mild MR/  moderate LAE/  trivial TR/  trivial pericardial effusion posterior to the heart  . [place]  . VIDEO BRONCHOSCOPY Bilateral 09/21/2016   Procedure: VIDEO BRONCHOSCOPY WITHOUT FLUORO;  Surgeon: Collene Gobble, MD;  Location: [place];  Service: Cardiopulmonary;  Laterality: Bilateral;    Current Outpatient Medications  Medication Sig Dispense Refill  . acetaminophen (TYLENOL) 500 MG tablet Take 1,000 mg by mouth every 6 (six) hours as needed for moderate pain or headache.    . albuterol (PROVENTIL HFA;VENTOLIN HFA) 108 (90 Base) MCG/ACT inhaler Inhale 2 puffs into the lungs every 6 (six) hours as needed for wheezing or shortness of breath.    [name] amiodarone (PACERONE) 200 MG tablet Take 1 tablet (200 mg total) by mouth daily. 180 tablet 3  . apixaban (ELIQUIS) 5 MG TABS tablet Take 1 tablet (5 mg total) by mouth 2 (two) times daily.    . bumetanide (BUMEX) 2 MG tablet Take 0.5 tablets (1 mg total) by mouth daily.    [name] buPROPion (WELLBUTRIN XL) 300 MG 24 hr tablet Take 300 mg by mouth every morning.    . clotrimazole (LOTRIMIN) 1 % cream Apply 1 application topically 2 (two) times daily as needed (itching).    . Coenzyme Q10 (COQ10) 100 MG CAPS Take 100 mg by mouth every evening.     .  cyanocobalamin (,VITAMIN B-12,) 1000 MCG/ML injection Inject 1,000 mcg into the muscle every 30 (thirty) days.    [name] diltiazem (TIAZAC) 180 MG 24 hr capsule Take 1 capsule (180 mg total) by mouth every morning. 14 capsule 1  . escitalopram (LEXAPRO) 10 MG tablet Take 10 mg by mouth daily.    [name] losartan (COZAAR) 25 MG tablet Take 1 tablet (25 mg total) by mouth daily. 90 tablet 3  . magnesium oxide (MAG-OX) 400 MG tablet Take 400 mg by mouth daily.    . metolazone (ZAROXOLYN) 2.5 MG tablet Take 2.5 mg by mouth daily as needed (for fluid retention).     . metoprolol tartrate (LOPRESSOR) 50 MG tablet Take 1 tablet (50 mg total) by mouth 2 (two) times daily. 180 tablet 3  .  Polyethyl Glycol-Propyl Glycol (SYSTANE ULTRA) 0.4-0.3 % SOLN Place 1 drop into both eyes daily as needed (for dry eyes).    . potassium chloride SA (K-DUR,KLOR-CON) 20 MEQ tablet Take 1-2 tablets (20-40 mEq total) by mouth See admin instructions. Take 40 meq in the morning and 20 meq in the evening    . tamsulosin (FLOMAX) 0.4 MG CAPS capsule Take 0.8 mg by mouth at bedtime.      No current facility-administered medications for this encounter.     Allergies  Allergen Reactions  . Ace Inhibitors Cough  . Xarelto [Rivaroxaban] Other (See Comments)    Joint pain    Social History   Socioeconomic History  . Marital status: Married    Spouse name: Not on file  . Number of children: Not on file  . Years of education: Not on file  . Highest education level: Not on file  Occupational History  . Not on file  Social Needs  . Financial resource strain: Not on file  . Food insecurity:    Worry: Not on file    Inability: Not on file  . Transportation needs:    Medical: Not on file    Non-medical: Not on file  Tobacco Use  . Smoking status: Never Smoker  . Smokeless tobacco: Never Used  Substance and Sexual Activity  . Alcohol use: No  . Drug use: No  . Sexual activity: Not Currently  Lifestyle  . Physical activity:      Days per week: Not on file    Minutes per session: Not on file  . Stress: Not on file  Relationships  . Social connections:    Talks on phone: Not on file    Gets together: Not on file    Attends religious service: Not on file    Active member of club or organization: Not on file    Attends meetings of clubs or organizations: Not on file    Relationship status: Not on file  . Intimate partner violence:    Fear of current or ex partner: Not on file    Emotionally abused: Not on file    Physically abused: Not on file    Forced sexual activity: Not on file  Other Topics Concern  . 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 Liver disease Mother   . Renal Disease Mother   . Stroke Mother   . Atrial fibrillation Father   . Cancer Sister     ROS- All systems are reviewed and negative except as per the HPI above  Physical Exam: Vitals:   10/18/18 1551  BP: 120/62  Pulse: 82  Weight: 132.9 kg  Height: 5\' 10"  (1.778 m)   Wt Readings from Last 3 Encounters:  10/18/18 132.9 kg  10/09/18 130.2 kg  09/20/18 130.2 kg    Labs: Lab Results  Component Value Date   NA 143 10/03/2018   K 4.5 10/03/2018   CL 110 10/03/2018   CO2 25 10/03/2018   GLUCOSE 118 (H) 10/03/2018   BUN 22 10/03/2018   CREATININE 1.00 10/03/2018   CALCIUM 8.9 10/03/2018   MG 1.5 (L) 05/09/2018   Lab Results  Component Value Date   INR 1.32 08/03/2015   Lab Results  Component Value Date   CHOL 167 05/09/2018   HDL  46 05/09/2018   LDLCALC 88 05/09/2018   TRIG 164 (H) 05/09/2018     GEN- The patient is well appearing, alert and oriented x 3 today.   Head- normocephalic, atraumatic Eyes-  Sclera clear, conjunctiva pink Ears- hearing intact Oropharynx- clear Neck- supple, no JVP Lymph- no cervical lymphadenopathy Lungs- Clear to ausculation  bilaterally, normal work of breathing Heart- irregular rate and rhythm, no murmurs, rubs or gallops, PMI not laterally displaced GI- soft, NT, ND, + BS Extremities- no clubbing, cyanosis, or edema MS- no significant deformity or atrophy Skin- no rash or lesion Psych- euthymic mood, full affect Neuro- strength and sensation are intact  EKG- SR  with pac's/pvc's, 82 bpm, PR int 224 ms, qrs int 104 ms, qtc 472 ms   Assessment and Plan: 1. Afib/flutter  Successful cardioversion Feels improved with better stamina Reduce  amiodarone to 200 mg daily Continue BB/CCB without change  Continue eliqus 5 mg bid for chadsvasc score of at least 4, aware not to interrupt for 4 weeks after cardioversion   F/u with Dr. Rayann Heman 4/1   Geroge Baseman. Delphia Kaylor, [facility] [address] [place], [place] [address] [phone]

## 2018-10-31 ENCOUNTER — Telehealth: Payer: Self-pay | Admitting: Cardiology

## 2018-10-31 NOTE — Telephone Encounter (Signed)
Spoke with patient and he feels like he is back in Afib. Patient has been having shortness of breath with little exertion, elevated HR, shortness of breath, and apple watch has indicated Afib several times. Discussed with Lauree Chandler CMA at Afib clinic and the office will call patient directly to discuss. Will forward to Geroge Baseman NP and Yves Dill RN

## 2018-10-31 NOTE — Telephone Encounter (Signed)
Pt c/o Shortness Of Breath: STAT if SOB developed within the last 24 hours or pt is noticeably SOB on the phone  1. Are you currently SOB (can you hear that pt is SOB on the phone)? occassional  2. How long have you been experiencing SOB? More than a week  3. Are you SOB when sitting or when up moving around? Moving around  4. Are you currently experiencing any other symptoms? Chest discomfort  Apple watch says sleep pulse goes up at a particular time. Watch also says it can't read pulse when its over 130   Pt is worried about afib.

## 2018-10-31 NOTE — Telephone Encounter (Signed)
Talked with patient. Offered appt today but pt stated he could not come in until tomorrow.

## 2018-11-01 ENCOUNTER — Other Ambulatory Visit: Payer: Self-pay

## 2018-11-01 ENCOUNTER — Encounter (HOSPITAL_COMMUNITY): Payer: Self-pay | Admitting: Nurse Practitioner

## 2018-11-01 ENCOUNTER — Ambulatory Visit (HOSPITAL_COMMUNITY)
Admission: RE | Admit: 2018-11-01 | Discharge: 2018-11-01 | Disposition: A | Discharge status: Home / Self Care | Payer: Medicare Other | Source: Ambulatory Visit | Attending: Nurse Practitioner | Admitting: Nurse Practitioner

## 2018-11-01 VITALS — BP 122/70 | HR 83 | Ht 70.0 in | Wt 289.0 lb

## 2018-11-01 DIAGNOSIS — Z888 Allergy status to other drugs, medicaments and biological substances status: Secondary | ICD-10-CM | POA: Diagnosis not present

## 2018-11-01 DIAGNOSIS — R0602 Shortness of breath: Secondary | ICD-10-CM | POA: Diagnosis not present

## 2018-11-01 DIAGNOSIS — E291 Testicular hypofunction: Secondary | ICD-10-CM | POA: Diagnosis not present

## 2018-11-01 DIAGNOSIS — I5032 Chronic diastolic (congestive) heart failure: Secondary | ICD-10-CM | POA: Insufficient documentation

## 2018-11-01 DIAGNOSIS — Z8249 Family history of ischemic heart disease and other diseases of the circulatory system: Secondary | ICD-10-CM | POA: Diagnosis not present

## 2018-11-01 DIAGNOSIS — Z923 Personal history of irradiation: Secondary | ICD-10-CM | POA: Insufficient documentation

## 2018-11-01 DIAGNOSIS — Z96651 Presence of right artificial knee joint: Secondary | ICD-10-CM | POA: Diagnosis not present

## 2018-11-01 DIAGNOSIS — I110 Hypertensive heart disease with heart failure: Secondary | ICD-10-CM | POA: Diagnosis not present

## 2018-11-01 DIAGNOSIS — Z7901 Long term (current) use of anticoagulants: Secondary | ICD-10-CM | POA: Diagnosis not present

## 2018-11-01 DIAGNOSIS — I4892 Unspecified atrial flutter: Secondary | ICD-10-CM | POA: Insufficient documentation

## 2018-11-01 DIAGNOSIS — G4733 Obstructive sleep apnea (adult) (pediatric): Secondary | ICD-10-CM | POA: Insufficient documentation

## 2018-11-01 DIAGNOSIS — I4819 Other persistent atrial fibrillation: Secondary | ICD-10-CM | POA: Diagnosis not present

## 2018-11-01 DIAGNOSIS — I4510 Unspecified right bundle-branch block: Secondary | ICD-10-CM | POA: Insufficient documentation

## 2018-11-01 DIAGNOSIS — Z79899 Other long term (current) drug therapy: Secondary | ICD-10-CM | POA: Insufficient documentation

## 2018-11-01 DIAGNOSIS — Z9884 Bariatric surgery status: Secondary | ICD-10-CM | POA: Diagnosis not present

## 2018-11-01 DIAGNOSIS — Z8546 Personal history of malignant neoplasm of prostate: Secondary | ICD-10-CM | POA: Insufficient documentation

## 2018-11-01 DIAGNOSIS — Z809 Family history of malignant neoplasm, unspecified: Secondary | ICD-10-CM | POA: Diagnosis not present

## 2018-11-01 DIAGNOSIS — I480 Paroxysmal atrial fibrillation: Secondary | ICD-10-CM | POA: Insufficient documentation

## 2018-11-01 DIAGNOSIS — R0600 Dyspnea, unspecified: Secondary | ICD-10-CM | POA: Diagnosis not present

## 2018-11-01 LAB — CBC
HCT: 38.8 % — ABNORMAL LOW (ref 39.0–52.0)
Hemoglobin: 11.9 g/dL — ABNORMAL LOW (ref 13.0–17.0)
MCH: 28.1 pg (ref 26.0–34.0)
MCHC: 30.7 g/dL (ref 30.0–36.0)
MCV: 91.5 fL (ref 80.0–100.0)
Platelets: 199 10*3/uL (ref 150–400)
RBC: 4.24 MIL/uL (ref 4.22–5.81)
RDW: 15.5 % (ref 11.5–15.5)
WBC: 5.8 10*3/uL (ref 4.0–10.5)
nRBC: 0 % (ref 0.0–0.2)

## 2018-11-01 LAB — COMPREHENSIVE METABOLIC PANEL
ALT: 28 U/L (ref 0–44)
AST: 18 U/L (ref 15–41)
Albumin: 3.3 g/dL — ABNORMAL LOW (ref 3.5–5.0)
Alkaline Phosphatase: 118 U/L (ref 38–126)
Anion gap: 5 (ref 5–15)
BUN: 17 mg/dL (ref 8–23)
CO2: 23 mmol/L (ref 22–32)
Calcium: 8.6 mg/dL — ABNORMAL LOW (ref 8.9–10.3)
Chloride: 112 mmol/L — ABNORMAL HIGH (ref 98–111)
Creatinine, Ser: 0.87 mg/dL (ref 0.61–1.24)
GFR calc Af Amer: >60 mL/min (ref >60)
GFR calc non Af Amer: >60 mL/min (ref >60)
Glucose, Bld: 99 mg/dL (ref 70–99)
Potassium: 4.1 mmol/L (ref 3.5–5.1)
Sodium: 140 mmol/L (ref 135–145)
Total Bilirubin: 0.6 mg/dL (ref 0.3–1.2)
Total Protein: 5.9 g/dL — ABNORMAL LOW (ref 6.5–8.1)

## 2018-11-01 LAB — TSH: TSH: 3.519 u[IU]/mL (ref 0.350–4.500)

## 2018-11-01 NOTE — Progress Notes (Addendum)
Primary Care Physician: [name], MD Referring Physician: Dr. Aretta Nip is a 78 y.o. male with a h/o paroxysmal afib and flutter that is in the afib clinic for complaints of shortness of breath over  the last few days which prompted him to think he was back in afib, especially when his Apple watch yesterday had strips listed as afib, but on review, SR with artifact and PC's. He is s/p ablation 10/19, with recurrent afib and then successful cardioversion 07/26/18 with ERAF. He then increased amiodarone to 200 mg bid and had another successful cardioversion 2/24 and appears to continue in SR. His amiodarone was decreased to 200 mg daily after cardioversion.  No other change in his health but c/o that he is much more winded in the last few days much worse than when he was in afib, makes him weak.[name] His weight is stable according to our weights,down a couple of lbs. PO 96%. BP stable. No blood noted in stool or urine.  Today, he denies symptoms of palpitations, chest pain,  breath, orthopnea, PND, lower extremity edema, dizziness, presyncope, syncope, or neurologic sequela. + for shortness of breath and weakness. The patient is tolerating medications without difficulties and is otherwise without complaint today.   Past Medical History:  Diagnosis Date  . Anemia 1980s X 1  . Arthritis    "hands, knees, hips, ankles" (07/31/2015)  . Atrial flutter ([place]) 07/31/2015  . Chronic diastolic heart failure ([place])    a. 11/2015: Echo w/ EF of 60-65%, no WMA, Grade 2 DD, trivial AR, ascending aorta mildly dilated.   . Depression   . History of cardiovascular stress test    a. 03/2015: Dobutamine Stress Echo with no evidence of ischemia.   [name] History of gastric bypass   . History of peptic ulcer    1980's  . History of radiation therapy 1993   sarcoma of left groin, tx at [facility]  . History of sarcoma    1993  LEFT GOIN--  S/P SURGERY, RADIATION AND CHEMO IN [place]  .  Hypertension   . Hypogonadism in male   . Incomplete right bundle branch block   . 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 Nocturia   . OSA on CPAP   . Persistent atrial fibrillation       . Primary prostate adenocarcinoma ([place]) DX 07/08/14   Gleason 7,  stage T1c  . PVC's (premature ventricular contractions) 11/21/2015  . 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 Weakness of left leg    SECONDARY TO SURGICAL NERVE INJURY OF LEFT GOIN  . Wears glasses    Past Surgical History:  Procedure Laterality Date  . ATRIAL FIBRILLATION ABLATION N/A 06/08/2018   Procedure: ATRIAL FIBRILLATION ABLATION;  Surgeon: Thompson Grayer, MD;  Location: [place] [facility];  Service: Cardiovascular;  Laterality: N/A;  . CARDIAC CATHETERIZATION  08-12-2003  dr Tressia Miners turner   Normal coronary arteries, normal wall motion, no sig. abnormalities  . CARDIOVERSION N/A 08/04/2015   Procedure: CARDIOVERSION;  Surgeon: [name], MD;  Location: [place];  Service: Cardiovascular;  Laterality: N/A;  . CARDIOVERSION N/A 10/28/2017   Procedure: CARDIOVERSION;  Surgeon: Larey Dresser, MD;  Location: [facility] ENDOSCOPY;  Service: Cardiovascular;  Laterality: N/A;  . CARDIOVERSION N/A 04/12/2018   Procedure: CARDIOVERSION;  Surgeon: Thayer Headings, MD;  Location: [facility]  ENDOSCOPY;  Service: Cardiovascular;  Laterality: N/A;  . CARDIOVERSION N/A 07/26/2018   Procedure: CARDIOVERSION;  Surgeon: Larey Dresser, MD;  Location: [facility] ENDOSCOPY;  Service: Cardiovascular;  Laterality: N/A;  . CARDIOVERSION N/A 10/09/2018   Procedure: CARDIOVERSION;  Surgeon: Sueanne Margarita, MD;  Location: [facility] ENDOSCOPY;  Service: Cardiovascular;  Laterality: N/A;  . COLONOSCOPY  07-03-2002  . [place]  . FRACTURE SURGERY    . GROIN DISSECTION Left 1992   [place]   SARCOMA SURGERY  . INGUINAL HERNIA REPAIR Right 1950s?  .  INTESTINAL BYPASS  1976   GASTRIC FOR OBESITY  . JOINT REPLACEMENT    . PATELLA FRACTURE SURGERY Left ~ 1995   "broke it twice; only had OR once"  . PROSTATE BIOPSY  06/2014  . RADIOACTIVE SEED IMPLANT N/A 10/17/2014   Procedure: RADIOACTIVE SEED IMPLANT    ;  Surgeon: Ailene Rud, MD;  Location: [facility];  Service: Urology;  Laterality: N/A;   68 SEEDS IMPLANTED   . TEE WITHOUT CARDIOVERSION N/A 10/28/2017   Procedure: TRANSESOPHAGEAL ECHOCARDIOGRAM (TEE);  Surgeon: Larey Dresser, MD;  Location: [facility] ENDOSCOPY;  Service: Cardiovascular;  Laterality: N/A;  . TONSILLECTOMY  1950s  . TOTAL KNEE ARTHROPLASTY Right 12/17/2014   Procedure: TOTAL KNEE ARTHROPLASTY;  Surgeon: Melrose Nakayama, MD;  Location: [place];  Service: Orthopedics;  Laterality: Right;  . TRANSTHORACIC ECHOCARDIOGRAM  08-08-2003   moderate LVH/  ef 55-65%/  mild MR/  moderate LAE/  trivial TR/  trivial pericardial effusion posterior to the heart  . [place]  . VIDEO BRONCHOSCOPY Bilateral 09/21/2016   Procedure: VIDEO BRONCHOSCOPY WITHOUT FLUORO;  Surgeon: Collene Gobble, MD;  Location: [name];  Service: Cardiopulmonary;  Laterality: Bilateral;    Current Outpatient Medications  Medication Sig Dispense Refill  . acetaminophen (TYLENOL) 500 MG tablet Take 1,000 mg by mouth every 6 (six) hours as needed for moderate pain or headache.    . albuterol (PROVENTIL HFA;VENTOLIN HFA) 108 (90 Base) MCG/ACT inhaler Inhale 2 puffs into the lungs every 6 (six) hours as needed for wheezing or shortness of breath.    [name] amiodarone (PACERONE) 200 MG tablet Take 1 tablet (200 mg total) by mouth daily. 180 tablet 3  . apixaban (ELIQUIS) 5 MG TABS tablet Take 1 tablet (5 mg total) by mouth 2 (two) times daily.    . bumetanide (BUMEX) 2 MG tablet Take 0.5 tablets (1 mg total) by mouth daily.    [name] buPROPion (WELLBUTRIN XL) 300 MG 24 hr tablet Take 300 mg by mouth every morning.    . clotrimazole  (LOTRIMIN) 1 % cream Apply 1 application topically 2 (two) times daily as needed (itching).    . Coenzyme Q10 (COQ10) 100 MG CAPS Take 100 mg by mouth every evening.     . cyanocobalamin (,VITAMIN B-12,) 1000 MCG/ML injection Inject 1,000 mcg into the muscle every 30 (thirty) days.    [name] diltiazem (TIAZAC) 180 MG 24 hr capsule Take 1 capsule (180 mg total) by mouth every morning. 14 capsule 1  . escitalopram (LEXAPRO) 10 MG tablet Take 10 mg by mouth daily.    [name] losartan (COZAAR) 25 MG tablet Take 1 tablet (25 mg total) by mouth daily. 90 tablet 3  . magnesium oxide (MAG-OX) 400 MG tablet Take 400 mg by mouth daily.    . metolazone (ZAROXOLYN) 2.5 MG tablet Take 2.5 mg by mouth daily as needed (for fluid retention).     [name]  metoprolol tartrate (LOPRESSOR) 50 MG tablet Take 1 tablet (50 mg total) by mouth 2 (two) times daily. 180 tablet 3  . Polyethyl Glycol-Propyl Glycol (SYSTANE ULTRA) 0.4-0.3 % SOLN Place 1 drop into both eyes daily as needed (for dry eyes).    . potassium chloride SA (K-DUR,KLOR-CON) 20 MEQ tablet Take 1-2 tablets (20-40 mEq total) by mouth See admin instructions. Take 40 meq in the morning and 20 meq in the evening    . tamsulosin (FLOMAX) 0.4 MG CAPS capsule Take 0.8 mg by mouth at bedtime.      No current facility-administered medications for this encounter.     Allergies  Allergen Reactions  . Ace Inhibitors Cough  . Xarelto [Rivaroxaban] Other (See Comments)    Joint pain    Social History   Socioeconomic History  . Marital status: Married    Spouse name: Not on file  . Number of children: Not on file  . Years of education: Not on file  . Highest education level: Not on file  Occupational History  . Not on file  Social Needs  . Financial resource strain: Not on file  . Food insecurity:    Worry: Not on file    Inability: Not on file  . Transportation needs:    Medical: Not on file    Non-medical: Not on file  Tobacco Use  . Smoking status: Never Smoker   . Smokeless tobacco: Never Used  Substance and Sexual Activity  . Alcohol use: No  . Drug use: No  . Sexual activity: Not Currently  Lifestyle  . Physical activity:    Days per week: Not on file    Minutes per session: Not on file  . Stress: Not on file  Relationships  . Social connections:    Talks on phone: Not on file    Gets together: Not on file    Attends religious service: Not on file    Active member of club or organization: Not on file    Attends meetings of clubs or organizations: Not on file    Relationship status: Not on file  . Intimate partner violence:    Fear of current or ex partner: Not on file    Emotionally abused: Not on file    Physically abused: Not on file    Forced sexual activity: Not on file  Other Topics Concern  . 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 Liver disease Mother   . Renal Disease Mother   . Stroke Mother   . Atrial fibrillation Father   . Cancer Sister     ROS- All systems are reviewed and negative except as per the HPI above  Physical Exam: Vitals:   11/01/18 0904  BP: 122/70  Pulse: 83  Weight: 131.1 kg  Height: 5\' 10"  (1.778 m)   Wt Readings from Last 3 Encounters:  11/01/18 131.1 kg  10/18/18 132.9 kg  10/09/18 130.2 kg    Labs: Lab Results  Component Value Date   NA 143 10/03/2018   K 4.5 10/03/2018   CL 110 10/03/2018   CO2 25 10/03/2018   GLUCOSE 118 (H) 10/03/2018   BUN 22 10/03/2018   CREATININE 1.00 10/03/2018   CALCIUM 8.9 10/03/2018   MG 1.5 (L) 05/09/2018   Lab Results  Component Value  Date   INR 1.32 08/03/2015   Lab Results  Component Value Date   CHOL 167 05/09/2018   HDL 46 05/09/2018   LDLCALC 88 05/09/2018   TRIG 164 (H) 05/09/2018     GEN- The patient is well appearing, alert and oriented x 3 today.   Head- normocephalic, atraumatic  Eyes-  Sclera clear, conjunctiva pink Ears- hearing intact Oropharynx- clear Neck- supple, no JVP Lymph- no cervical lymphadenopathy Lungs- Clear to ausculation bilaterally, normal work of breathing Heart- regular rate and rhythm, no murmurs, rubs or gallops, PMI not laterally displaced GI- soft, NT, ND, + BS Extremities- no clubbing, cyanosis, or edema MS- no significant deformity or atrophy Skin- no rash or lesion Psych- euthymic mood, full affect Neuro- strength and sensation are intact  EKG- SR at 83, pr int 166 ms, qrs int 108 ms, qtc 458 ms Epic records reviewed  Assessment and Plan: 1. Afib/flutter  Successful cardioversion after extended amiodarone loading He felt he was in afib as he has become more short of breath but he is in SR  Weight is stable, he is on daily diuretics I see no obvious reason for him to to have increased exertional dyspnea May be 2/2 to amiodarone side effects Will order TSH/Cmet/CBC CXR Continue eliqus 5 mg bid for chadsvasc score of at least 4   I will review the above results and then discuss with Dr. Rayann Heman to see if amio can be stopped  Addendum- 3/18- 3 pm- Labs/cxr stable- Discussed with Dr. Rayann Heman and will stop amiodarone. F/u with Dr. Rayann Heman 4/1.  Geroge Baseman Onisha Cedeno, [facility] [address] [place], [place] [address] [phone]

## 2018-11-13 ENCOUNTER — Telehealth: Payer: Self-pay

## 2018-11-13 NOTE — Telephone Encounter (Signed)
Left message for pt regarding virtual visit on 11/15/18. Advise pt to call us if he had any questions or concerns.

## 2018-11-14 ENCOUNTER — Other Ambulatory Visit: Payer: Self-pay | Admitting: Cardiovascular Disease

## 2018-11-14 NOTE — Telephone Encounter (Signed)
Complee

## 2018-11-14 NOTE — Telephone Encounter (Signed)
Spoke with pt regarding virtual visit on 11/15/18. I explain steps to set up virtual visit and uploading EKG to MyChart. Pt stated he was able to EKG to MyChart and complete steps for virtual visit. Pt was advise to check pulse and BP prior to appt. Pt questions and concerns were address.

## 2018-11-14 NOTE — Telephone Encounter (Signed)
Follow up   Patient has questions about the virtual visit on 11/15/2018. Please call the patient to discuss.

## 2018-11-15 ENCOUNTER — Telehealth (INDEPENDENT_AMBULATORY_CARE_PROVIDER_SITE_OTHER): Payer: BC Managed Care – PPO | Admitting: Internal Medicine

## 2018-11-15 ENCOUNTER — Other Ambulatory Visit: Payer: Self-pay

## 2018-11-15 ENCOUNTER — Encounter (HOSPITAL_COMMUNITY): Payer: Self-pay

## 2018-11-15 VITALS — BP 122/70 | HR 90

## 2018-11-15 DIAGNOSIS — I5032 Chronic diastolic (congestive) heart failure: Secondary | ICD-10-CM | POA: Diagnosis not present

## 2018-11-15 DIAGNOSIS — I4819 Other persistent atrial fibrillation: Secondary | ICD-10-CM

## 2018-11-15 DIAGNOSIS — I4892 Unspecified atrial flutter: Secondary | ICD-10-CM

## 2018-11-15 DIAGNOSIS — I10 Essential (primary) hypertension: Secondary | ICD-10-CM

## 2018-11-15 NOTE — Progress Notes (Signed)
Electrophysiology TeleHealth Note   Due to national recommendations of social distancing due to COVID 19, an audio/video telehealth visit is felt to be most appropriate for this patient at this time.  See MyChart message from today for the patient's consent to telehealth for [facility].   Date:  11/15/2018   ID:  Eric Mayer, DOB [birth year], MRN [id number]  Location: patient's home  Provider location: [address] [place]  Evaluation Performed: Follow-up visit  PCP:  [name], MD  Cardiologist:  [name], MD Electrophysiologist:  Dr Rayann Heman  Chief Complaint: afib  History of Present Illness:    Eric Mayer is a 78 y.o. male who presents via audio/video conferencing for a telehealth visit today.  Since his ablation, the patient reports doing reasonably well.  He denies procedure related complications.  Unfortunately, he continues to have afib.  He underwent cardioversion in February.  He has been in atypical atrial flutter since.  He has fatigue and decreased exercise tolerance.  He is very SOB, just walking in his house.  He had a fall earlier this week at home due to weakness. Today, he denies symptoms of palpitations, chest pain,  lower extremity edema, dizziness, presyncope, or syncope.  The patient is otherwise without complaint today.  The patient denies symptoms of fevers, chills, cough, or new SOB worrisome for COVID 19.  Past Medical History:  Diagnosis Date  . Anemia 1980s X 1  . Arthritis    "hands, knees, hips, ankles" (07/31/2015)  . Atrial flutter ([place]) 07/31/2015  . Chronic diastolic heart failure ([place])    a. 11/2015: Echo w/ EF of 60-65%, no WMA, Grade 2 DD, trivial AR, ascending aorta mildly dilated.   . Depression   . History of cardiovascular stress test    a. 03/2015: Dobutamine Stress Echo with no evidence of ischemia.   [name] History of gastric bypass   . History of peptic ulcer    1980's  . History of radiation  therapy 1993   sarcoma of left groin, tx at [facility]  . History of sarcoma    1993  LEFT GOIN--  S/P SURGERY, RADIATION AND CHEMO IN [place]  . Hypertension   . Hypogonadism in male   . Incomplete right bundle branch block   . 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 Nocturia   . OSA on CPAP   . Persistent atrial fibrillation       . Primary prostate adenocarcinoma ([place]) DX 07/08/14   Gleason 7,  stage T1c  . PVC's (premature ventricular contractions) 11/21/2015  . 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 Weakness of left leg    SECONDARY TO SURGICAL NERVE INJURY OF LEFT GOIN  . Wears glasses     Past Surgical History:  Procedure Laterality Date  . ATRIAL FIBRILLATION ABLATION N/A 06/08/2018   Procedure: ATRIAL FIBRILLATION ABLATION;  Surgeon: Thompson Grayer, MD;  Location: [place] [facility];  Service: Cardiovascular;  Laterality: N/A;  . CARDIAC CATHETERIZATION  08-12-2003  dr Tressia Miners turner   Normal coronary arteries, normal wall motion, no sig. abnormalities  . CARDIOVERSION N/A 08/04/2015   Procedure: CARDIOVERSION;  Surgeon: [name], MD;  Location: [place];  Service: Cardiovascular;  Laterality: N/A;  . CARDIOVERSION N/A 10/28/2017   Procedure: CARDIOVERSION;  Surgeon: Larey Dresser, MD;  Location: [facility] ENDOSCOPY;  Service: Cardiovascular;  Laterality: N/A;  . CARDIOVERSION N/A 04/12/2018   Procedure: CARDIOVERSION;  Surgeon: Thayer Headings, MD;  Location: [facility] ENDOSCOPY;  Service: Cardiovascular;  Laterality: N/A;  . CARDIOVERSION N/A 07/26/2018   Procedure: CARDIOVERSION;  Surgeon: Larey Dresser, MD;  Location: [facility] ENDOSCOPY;  Service: Cardiovascular;  Laterality: N/A;  . CARDIOVERSION N/A 10/09/2018   Procedure: CARDIOVERSION;  Surgeon: Sueanne Margarita, MD;  Location: [facility] ENDOSCOPY;  Service: Cardiovascular;  Laterality: N/A;  . COLONOSCOPY  07-03-2002  . [place]  . FRACTURE SURGERY    . GROIN DISSECTION Left 1992   [place]   SARCOMA SURGERY  . INGUINAL HERNIA REPAIR Right 1950s?  . INTESTINAL BYPASS  1976   GASTRIC FOR OBESITY  . JOINT REPLACEMENT    . PATELLA FRACTURE SURGERY Left ~ 1995   "broke it twice; only had OR once"  . PROSTATE BIOPSY  06/2014  . RADIOACTIVE SEED IMPLANT N/A 10/17/2014   Procedure: RADIOACTIVE SEED IMPLANT    ;  Surgeon: Ailene Rud, MD;  Location: [facility];  Service: Urology;  Laterality: N/A;   68 SEEDS IMPLANTED   . TEE WITHOUT CARDIOVERSION N/A 10/28/2017   Procedure: TRANSESOPHAGEAL ECHOCARDIOGRAM (TEE);  Surgeon: Larey Dresser, MD;  Location: [facility] ENDOSCOPY;  Service: Cardiovascular;  Laterality: N/A;  . TONSILLECTOMY  1950s  . TOTAL KNEE ARTHROPLASTY Right 12/17/2014   Procedure: TOTAL KNEE ARTHROPLASTY;  Surgeon: Melrose Nakayama, MD;  Location: [place];  Service: Orthopedics;  Laterality: Right;  . TRANSTHORACIC ECHOCARDIOGRAM  08-08-2003   moderate LVH/  ef 55-65%/  mild MR/  moderate LAE/  trivial TR/  trivial pericardial effusion posterior to the heart  . [place]  . VIDEO BRONCHOSCOPY Bilateral 09/21/2016   Procedure: VIDEO BRONCHOSCOPY WITHOUT FLUORO;  Surgeon: Collene Gobble, MD;  Location: [place];  Service: Cardiopulmonary;  Laterality: Bilateral;    Current Outpatient Medications  Medication Sig Dispense Refill  . acetaminophen (TYLENOL) 500 MG tablet Take 1,000 mg by mouth every 6 (six) hours as needed for moderate pain or headache.    . albuterol (PROVENTIL HFA;VENTOLIN HFA) 108 (90 Base) MCG/ACT inhaler Inhale 2 puffs into the lungs every 6 (six) hours as needed for wheezing or shortness of breath.    [name] apixaban (ELIQUIS) 5 MG TABS tablet Take 1 tablet (5 mg total) by mouth 2 (two) times daily.    . bumetanide (BUMEX) 2 MG tablet Take 0.5 tablets (1 mg total) by mouth daily.    [name] buPROPion (WELLBUTRIN XL) 300 MG 24 hr tablet Take 300 mg  by mouth every morning.    . clotrimazole (LOTRIMIN) 1 % cream Apply 1 application topically 2 (two) times daily as needed (itching).    . Coenzyme Q10 (COQ10) 100 MG CAPS Take 100 mg by mouth every evening.     . cyanocobalamin (,VITAMIN B-12,) 1000 MCG/ML injection Inject 1,000 mcg into the muscle every 30 (thirty) days.    [name] diltiazem (TIAZAC) 180 MG 24 hr capsule Take 1 capsule (180 mg total) by mouth every morning. 14 capsule 1  . escitalopram (LEXAPRO) 10 MG tablet Take 10 mg by mouth daily.    [name] losartan (COZAAR) 25 MG tablet Take 1 tablet (25 mg total) by mouth daily. 90 tablet 3  . magnesium oxide (MAG-OX) 400 MG tablet Take 400 mg by mouth daily.    . metolazone (ZAROXOLYN) 2.5 MG tablet Take 2.5 mg by mouth daily as  needed (for fluid retention).     . metoprolol tartrate (LOPRESSOR) 50 MG tablet Take 1 tablet (50 mg total) by mouth 2 (two) times daily. 180 tablet 3  . Polyethyl Glycol-Propyl Glycol (SYSTANE ULTRA) 0.4-0.3 % SOLN Place 1 drop into both eyes daily as needed (for dry eyes).    . potassium chloride SA (K-DUR,KLOR-CON) 20 MEQ tablet TAKE 2 TABLETS BY MOUTH DAILY. 180 tablet 2  . tamsulosin (FLOMAX) 0.4 MG CAPS capsule Take 0.8 mg by mouth at bedtime.     [name] amiodarone (PACERONE) 200 MG tablet Take 1 tablet (200 mg total) by mouth daily. (Patient not taking: Reported on 11/15/2018) 180 tablet 3   No current facility-administered medications for this visit.     Allergies:   Ace inhibitors and Xarelto [rivaroxaban]   Social History:  The patient  reports that he has never smoked. He has never used smokeless tobacco. He reports that he does not drink alcohol or use drugs.   Family History:  The patient's  family history includes Atrial fibrillation in his father; Cancer in his sister; Liver disease in his mother; Renal Disease in his mother; Stroke in his mother.   ROS:  Please see the history of present illness.   All other systems are personally reviewed and negative.     Exam:    Vital Signs:  BP 122/70   Pulse 90   Well appearing, alert and conversant, regular work of breathing,  good skin color Eyes- anicteric, neuro- grossly intact, skin- no apparent rash or lesions or cyanosis, mouth- oral mucosa is pink   Labs/Other Tests and Data Reviewed:    Recent Labs: 05/09/2018: Magnesium 1.5 11/01/2018: ALT 28; BUN 17; Creatinine, Ser 0.87; Hemoglobin 11.9; Platelets 199; Potassium 4.1; Sodium 140; TSH 3.519   Wt Readings from Last 3 Encounters:  11/01/18 289 lb (131.1 kg)  10/18/18 293 lb (132.9 kg)  10/09/18 287 lb (130.2 kg)     Other studies personally reviewed: Additional studies/ records that were reviewed today include: AF ablation ablation procedure note, AF clinic notes  Review of the above records today demonstrates: as above Prior radiographs: 11/01/2018- no air space disease  The patient presents wearable device technology report for my review today. On my review, the patient presents with Apple Watch tracings from 11/15/2018 . The tracings reveal rate controlled atypical atrial flutter  ekg 11/01/2018- atypical atrail flutter  ASSESSMENT & PLAN:    1.  Persistent afib/ atypical atrial flutter Continues to have difficulty with atrial arrhythmias Options include rate control, tikosyn or repeat ablation.  Ultimately (after amiodarone washout and COVID 19 is resolved), I would advise tikosyn as he has severe LA enlargement. For now, no changes He is rate controlled and anticoagulated with eliquis He is continue to continue rate control for now He will continue to obtain apple watch travings  2. Chronic diastolic dysfunction Stable No change required today  3. HTN Stable No change required today  4. Overweight .lifestyle modification is encouraged  5. COVID 19 screen The patient denies symptoms of COVID 19 at this time.  The importance of social distancing was discussed today.  Follow-up:  AF clinic via telemedicine in 4 weeks   Patient Risk:  after full review of this patients clinical status, I feel that they are at highrisk at this time.  Today, I have spent 25 minutes with the patient with telehealth technology discussing afib .    Signed, Thompson Grayer, MD  11/15/2018 2:25 PM  [place] [address] [place] [address] [phone] (office) [phone] (fax)

## 2018-12-07 ENCOUNTER — Encounter (HOSPITAL_COMMUNITY): Payer: Self-pay

## 2018-12-08 ENCOUNTER — Ambulatory Visit (HOSPITAL_COMMUNITY)
Admission: RE | Admit: 2018-12-08 | Discharge: 2018-12-08 | Disposition: A | Discharge status: Home / Self Care | Payer: BC Managed Care – PPO | Source: Ambulatory Visit | Attending: Nurse Practitioner | Admitting: Nurse Practitioner

## 2018-12-08 ENCOUNTER — Telehealth: Payer: Medicare Other | Admitting: Physician Assistant

## 2018-12-08 ENCOUNTER — Other Ambulatory Visit: Payer: Self-pay

## 2018-12-08 ENCOUNTER — Encounter (HOSPITAL_COMMUNITY): Payer: Self-pay | Admitting: Nurse Practitioner

## 2018-12-08 VITALS — BP 103/65 | HR 87 | Ht 70.0 in | Wt 282.0 lb

## 2018-12-08 DIAGNOSIS — R05 Cough: Secondary | ICD-10-CM

## 2018-12-08 DIAGNOSIS — I4892 Unspecified atrial flutter: Secondary | ICD-10-CM

## 2018-12-08 DIAGNOSIS — R059 Cough, unspecified: Secondary | ICD-10-CM

## 2018-12-08 MED ORDER — BENZONATATE 100 MG PO CAPS: 100.0000 mg | ORAL_CAPSULE | Freq: Two times a day (BID) | ORAL | 0 refills | Status: DC | PRN

## 2018-12-08 NOTE — Progress Notes (Signed)
Electrophysiology TeleHealth Note   Due to national recommendations of social distancing due to [place] 19, Audio/video  telehealth visit is felt to be most appropriate for this patient at this time.  See MyChart message/consent below  from today for patient consent regarding telehealth for the [facility].    Date:  12/08/2018   ID:  Eric Mayer, DOB 09/04/1940, MRN [id number]  Location: home  Provider location: [place], [place] [address] Evaluation Performed: exertional RVR  PCP:  [name], MD  Primary Cardiologist:   Dr. Oval Linsey Primary Electrophysiologist: Dr. Rayann Heman   CC: elevated exertional HR's   History of Present Illness: Eric Mayer is a 78 y.o. male who presents via audio/video conferencing for a telehealth visit today.   The patient is being seen for HR issues. He has an ablation back in the fall, left on amiodarone although it was not keeping pt in optimal SR at the time. He had a cardioversion in January and then on f/u had return of flutter and was c/o shortness of breath. At that time amiodarone was stopped. He saw Dr. Rayann Heman f/u early April and options were repeat ablation, rate control or tikosyn. He has been off amiodarone since mid March now. He reported elevated exertional heart rates when he is up and walking around 130-170 but returns to rate control with rest. He mostly is in the 80's at rest. He also runs soft BP's with  systolic around 209, highest systolic gets is around 470 systolic.He weighs daily and can quickly retain fluid. He reports 8 lbs in one day last week and usually metolazone will get back down to his baseline weight which is around 282. He and his wife are preparing to move to a retirement center but this has been put on hold with [place] 19.   Today, he denies symptoms of chest pain,  orthopnea, PND,  dizziness, presyncope, syncope, bleeding, or neurologic sequela.+ for exertional increase in HR, fatigue,  shortness of breath.  The patient is tolerating medications without difficulties and is otherwise without complaint today.   he denies symptoms of cough, fevers, chills, or new SOB worrisome for COVID 19.    Atrial Fibrillation Risk Factors:  he does have symptoms or diagnosis of sleep apnea. he is compliant with CPAP therapy. he does not have a history of rheumatic fever. he does not have a history of alcohol use. The patient  have a history of  atrial fibrillation in his father.   he has a BMI of Body mass index is 40.46 kg/m.[name] Filed Weights   12/08/18 0919  Weight: 127.9 kg    Past Medical History:  Diagnosis Date  . Anemia 1980s X 1  . Arthritis    "hands, knees, hips, ankles" (07/31/2015)  . Atrial flutter ([place]) 07/31/2015  . Chronic diastolic heart failure ([place])    a. 11/2015: Echo w/ EF of 60-65%, no WMA, Grade 2 DD, trivial AR, ascending aorta mildly dilated.   . Depression   . History of cardiovascular stress test    a. 03/2015: Dobutamine Stress Echo with no evidence of ischemia.   [name] History of gastric bypass   . History of peptic ulcer    1980's  . History of radiation therapy 1993   sarcoma of left groin, tx at [facility]  . History of sarcoma    1993  LEFT GOIN--  S/P SURGERY, RADIATION AND CHEMO IN [place]  . Hypertension   .  Hypogonadism in male   . Incomplete right bundle branch block   . 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 Nocturia   . OSA on CPAP   . Persistent atrial fibrillation       . 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 PVC's (premature ventricular contractions) 11/21/2015  . Sarcoma ([place]) ~ 1993/1994   "of groin"  . Weakness of left leg    SECONDARY TO SURGICAL NERVE INJURY OF LEFT GOIN  . Wears glasses    Past Surgical History:  Procedure Laterality Date  . ATRIAL FIBRILLATION ABLATION N/A 06/08/2018    Procedure: ATRIAL FIBRILLATION ABLATION;  Surgeon: Thompson Grayer, MD;  Location: [facility];  Service: Cardiovascular;  Laterality: N/A;  . CARDIAC CATHETERIZATION  08-12-2003  dr Tressia Miners turner   Normal coronary arteries, normal wall motion, no sig. abnormalities  . CARDIOVERSION N/A 08/04/2015   Procedure: CARDIOVERSION;  Surgeon: [name], MD;  Location: [facility];  Service: Cardiovascular;  Laterality: N/A;  . CARDIOVERSION N/A 10/28/2017   Procedure: CARDIOVERSION;  Surgeon: Larey Dresser, MD;  Location: [facility] ENDOSCOPY;  Service: Cardiovascular;  Laterality: N/A;  . CARDIOVERSION N/A 04/12/2018   Procedure: CARDIOVERSION;  Surgeon: Thayer Headings, MD;  Location: [facility] ENDOSCOPY;  Service: Cardiovascular;  Laterality: N/A;  . CARDIOVERSION N/A 07/26/2018   Procedure: CARDIOVERSION;  Surgeon: Larey Dresser, MD;  Location: [facility] ENDOSCOPY;  Service: Cardiovascular;  Laterality: N/A;  . CARDIOVERSION N/A 10/09/2018   Procedure: CARDIOVERSION;  Surgeon: Sueanne Margarita, MD;  Location: [facility] ENDOSCOPY;  Service: Cardiovascular;  Laterality: N/A;  . COLONOSCOPY  07-03-2002  . [name]  . FRACTURE SURGERY    . GROIN DISSECTION Left 1992   [place]   SARCOMA SURGERY  . INGUINAL HERNIA REPAIR Right 1950s?  . INTESTINAL BYPASS  1976   GASTRIC FOR OBESITY  . JOINT REPLACEMENT    . PATELLA FRACTURE SURGERY Left ~ 1995   "broke it twice; only had OR once"  . PROSTATE BIOPSY  06/2014  . RADIOACTIVE SEED IMPLANT N/A 10/17/2014   Procedure: RADIOACTIVE SEED IMPLANT    ;  Surgeon: Ailene Rud, MD;  Location: [facility];  Service: Urology;  Laterality: N/A;   68 SEEDS IMPLANTED   . TEE WITHOUT CARDIOVERSION N/A 10/28/2017   Procedure: TRANSESOPHAGEAL ECHOCARDIOGRAM (TEE);  Surgeon: Larey Dresser, MD;  Location: [facility] ENDOSCOPY;  Service: Cardiovascular;  Laterality: N/A;  . TONSILLECTOMY  1950s  . TOTAL KNEE ARTHROPLASTY Right 12/17/2014    Procedure: TOTAL KNEE ARTHROPLASTY;  Surgeon: Melrose Nakayama, MD;  Location: [facility];  Service: Orthopedics;  Laterality: Right;  . TRANSTHORACIC ECHOCARDIOGRAM  08-08-2003   moderate LVH/  ef 55-65%/  mild MR/  moderate LAE/  trivial TR/  trivial pericardial effusion posterior to the heart  . [place]  . VIDEO BRONCHOSCOPY Bilateral 09/21/2016   Procedure: VIDEO BRONCHOSCOPY WITHOUT FLUORO;  Surgeon: Collene Gobble, MD;  Location: [place] [facility];  Service: Cardiopulmonary;  Laterality: Bilateral;     Current Outpatient Medications  Medication Sig Dispense Refill  . acetaminophen (TYLENOL) 500 MG tablet Take 1,000 mg by mouth every 6 (six) hours as needed for moderate pain or headache.    . albuterol (PROVENTIL HFA;VENTOLIN HFA) 108 (90 Base) MCG/ACT inhaler Inhale 2 puffs into the lungs every  6 (six) hours as needed for wheezing or shortness of breath.    [name] apixaban (ELIQUIS) 5 MG TABS tablet Take 1 tablet (5 mg total) by mouth 2 (two) times daily.    . bumetanide (BUMEX) 2 MG tablet Take 0.5 tablets (1 mg total) by mouth daily.    [name] buPROPion (WELLBUTRIN XL) 300 MG 24 hr tablet Take 300 mg by mouth every morning.    . clotrimazole (LOTRIMIN) 1 % cream Apply 1 application topically 2 (two) times daily as needed (itching).    . Coenzyme Q10 (COQ10) 100 MG CAPS Take 100 mg by mouth every evening.     . cyanocobalamin (,VITAMIN B-12,) 1000 MCG/ML injection Inject 1,000 mcg into the muscle every 30 (thirty) days.    [name] diltiazem (TIAZAC) 180 MG 24 hr capsule Take 1 capsule (180 mg total) by mouth every morning. 14 capsule 1  . escitalopram (LEXAPRO) 10 MG tablet Take 10 mg by mouth daily.    [name] losartan (COZAAR) 25 MG tablet Take 1 tablet (25 mg total) by mouth daily. 90 tablet 3  . magnesium oxide (MAG-OX) 400 MG tablet Take 400 mg by mouth daily.    . metolazone (ZAROXOLYN) 2.5 MG tablet Take 2.5 mg by mouth daily as needed (for fluid retention).     . metoprolol tartrate  (LOPRESSOR) 50 MG tablet Take 1 tablet (50 mg total) by mouth 2 (two) times daily. 180 tablet 3  . Polyethyl Glycol-Propyl Glycol (SYSTANE ULTRA) 0.4-0.3 % SOLN Place 1 drop into both eyes daily as needed (for dry eyes).    . potassium chloride SA (K-DUR,KLOR-CON) 20 MEQ tablet TAKE 2 TABLETS BY MOUTH DAILY. 180 tablet 2  . tamsulosin (FLOMAX) 0.4 MG CAPS capsule Take 0.8 mg by mouth at bedtime.      No current facility-administered medications for this encounter.     Allergies:   Ace inhibitors and Xarelto [rivaroxaban]   Social History:  The patient  reports that he has never smoked. He has never used smokeless tobacco. He reports that he does not drink alcohol or use drugs.   Family History:  The patient's  family history includes Atrial fibrillation in his father; Cancer in his sister; Liver disease in his mother; Renal Disease in his mother; Stroke in his mother.    ROS:  Please see the history of present illness.   All other systems are personally reviewed and negative.   Exam: Well appearing, alert and conversant, regular work of breathing,  good skin color  Recent Labs: 05/09/2018: Magnesium 1.5 11/01/2018: ALT 28; BUN 17; Creatinine, Ser 0.87; Hemoglobin 11.9; Platelets 199; Potassium 4.1; Sodium 140; TSH 3.519  personally reviewed    Other studies personally reviewed: Epic records including Dr. Jackalyn Lombard last note  The patient presents wearable device technology report for my review today. On my review, the patient presents   Apple Watch tracing The tracings reveals afib or variable flutter    ASSESSMENT AND PLAN:  1.  Persistent atrial fibrillation/flutter Pt is currently rate controlled at rest but has spikes in HR with exertion which makes him unable to walk for any distance and causes increased shortness of breath Comfortable at rest with HR's in the 81'O His systolic BP's are soft and I am hesitate to increase rate control because of this and being rate controlled  at rest We discussed options again He is interested in repeat ablation or tikosyn admit, the best approach he will leave up to Dr. Rayann Heman, ( will not  be off  amiodarone x 3 months until mid June) and currently Covid 19 elective procedure restrictions are still in place I do see some potential drug drug interactions of current meds and tikosyn and these would have to be addressed prior to load if that is the plan off choice   Continue  CCB/BB without change in dose  Continue eliquis 5 mg bid   This patients CHA2DS2-VASc Score and unadjusted Ischemic Stroke Rate (% per year) is equal to 4.8 % stroke rate/year from a score of 4  Above score calculated as 1 point each if present [CHF, HTN, DM, Vascular=MI/PAD/Aortic Plaque, Age if 65-74, or Male] Above score calculated as 2 points each if present [Age > 75, or Stroke/TIA/TE]  2.Chronic diastilkic HF Pt is weighing daily and using metolazone prn to keep weight around 282 lbs( this am's weight)  COVID screen The patient does not have any symptoms that suggest any further testing/ screening at this time.  Social distancing reinforced today.    Follow-up:  I will send my note to Dr. Rayann Heman and get his advise re repeat ablation vrs tikosyn when covid 19 restrictions allow  Current medicines are reviewed at length with the patient today.   The patient does not have concerns regarding his medicines.  The following changes were made today:  none  Labs/ tests ordered today include: none No orders of the defined types were placed in this encounter.   Patient Risk:  after full review of this patients clinical status, I feel that they are at high risk at this time.   Today, I have spent  30 minutes( 10 mins prep work, 20 minutes face to face) with the patient with telehealth technology discussing arrhythmia/HR's   Signed, Roderic Palau NP 12/08/2018 9:24 AM  [facility] [address] [place], [place] [address]  [phone]   I hereby voluntarily request, consent and authorize the [facility] and its employed or contracted physicians, physician assistants, nurse practitioners or other licensed health care professionals (the Practitioner), to provide me with telemedicine health care services (the "Services") as deemed necessary by the treating Practitioner. I acknowledge and consent to receive the Services by the Practitioner via telemedicine. I understand that the telemedicine visit will involve communicating with the Practitioner through live audiovisual communication technology and the disclosure of certain medical information by electronic transmission. I acknowledge that I have been given the opportunity to request an in-person assessment or other available alternative prior to the telemedicine visit and am voluntarily participating in the telemedicine visit.   I understand that I have the right to withhold or withdraw my consent to the use of telemedicine in the course of my care at any time, without affecting my right to future care or treatment, and that the Practitioner or I may terminate the telemedicine visit at any time. I understand that I have the right to inspect all information obtained and/or recorded in the course of the telemedicine visit and may receive copies of available information for a reasonable fee.  I understand that some of the potential risks of receiving the Services via telemedicine include:   Delay or interruption in medical evaluation due to technological equipment failure or disruption;  Information transmitted may not be sufficient (e.g. poor resolution of images) to allow for appropriate medical decision making by the Practitioner; and/or  In rare instances, security protocols could fail, causing a breach of personal health information.   Furthermore, I acknowledge that it  is my responsibility to provide information about my medical history, conditions and care  that is complete and accurate to the best of my ability. I acknowledge that Practitioner's advice, recommendations, and/or decision may be based on factors not within their control, such as incomplete or inaccurate data provided by me or distortions of diagnostic images or specimens that may result from electronic transmissions. I understand that the practice of medicine is not an exact science and that Practitioner makes no warranties or guarantees regarding treatment outcomes. I acknowledge that I will receive a copy of this consent concurrently upon execution via email to the email address I last provided but may also request a printed copy by calling the office of the [facility].  I understand that my insurance will be billed for this visit.   I have read or had this consent read to me.  I understand the contents of this consent, which adequately explains the benefits and risks of the Services being provided via telemedicine.  I have been provided ample opportunity to ask questions regarding this consent and the Services and have had my questions answered to my satisfaction.  I give my informed consent for the services to be provided through the use of telemedicine in my medical care  By participating in this telemedicine visit I agree to the above.

## 2018-12-08 NOTE — H&P (View-Only) (Signed)
Electrophysiology TeleHealth Note   Due to national recommendations of social distancing due to [place] 19, Audio/video  telehealth visit is felt to be most appropriate for this patient at this time.  See MyChart message/consent below  from today for patient consent regarding telehealth for the [facility].    Date:  12/08/2018   ID:  Eric Mayer, DOB 1940/12/02, MRN [id number]  Location: home  Provider location: [place], [place] [address] Evaluation Performed: exertional RVR  PCP:  [name], MD  Primary Cardiologist:   Dr. Oval Linsey Primary Electrophysiologist: Dr. Rayann Heman   CC: elevated exertional HR's   History of Present Illness: Eric Mayer is a 78 y.o. male who presents via audio/video conferencing for a telehealth visit today.   The patient is being seen for HR issues. He has an ablation back in the fall, left on amiodarone although it was not keeping pt in optimal SR at the time. He had a cardioversion in January and then on f/u had return of flutter and was c/o shortness of breath. At that time amiodarone was stopped. He saw Dr. Rayann Heman f/u early April and options were repeat ablation, rate control or tikosyn. He has been off amiodarone since mid March now. He reported elevated exertional heart rates when he is up and walking around 130-170 but returns to rate control with rest. He mostly is in the 80's at rest. He also runs soft BP's with  systolic around 785, highest systolic gets is around 885 systolic.He weighs daily and can quickly retain fluid. He reports 8 lbs in one day last week and usually metolazone will get back down to his baseline weight which is around 282. He and his wife are preparing to move to a retirement center but this has been put on hold with [place] 19.   Today, he denies symptoms of chest pain,  orthopnea, PND,  dizziness, presyncope, syncope, bleeding, or neurologic sequela.+ for exertional increase in HR, fatigue,  shortness of breath.  The patient is tolerating medications without difficulties and is otherwise without complaint today.   he denies symptoms of cough, fevers, chills, or new SOB worrisome for COVID 19.    Atrial Fibrillation Risk Factors:  he does have symptoms or diagnosis of sleep apnea. he is compliant with CPAP therapy. he does not have a history of rheumatic fever. he does not have a history of alcohol use. The patient  have a history of  atrial fibrillation in his father.   he has a BMI of Body mass index is 40.46 kg/m.[name] Filed Weights   12/08/18 0919  Weight: 127.9 kg    Past Medical History:  Diagnosis Date  . Anemia 1980s X 1  . Arthritis    "hands, knees, hips, ankles" (07/31/2015)  . Atrial flutter ([place]) 07/31/2015  . Chronic diastolic heart failure ([place])    a. 11/2015: Echo w/ EF of 60-65%, no WMA, Grade 2 DD, trivial AR, ascending aorta mildly dilated.   . Depression   . History of cardiovascular stress test    a. 03/2015: Dobutamine Stress Echo with no evidence of ischemia.   [name] History of gastric bypass   . History of peptic ulcer    1980's  . History of radiation therapy 1993   sarcoma of left groin, tx at [facility]  . History of sarcoma    1993  LEFT GOIN--  S/P SURGERY, RADIATION AND CHEMO IN [place]  . Hypertension   .  Hypogonadism in male   . Incomplete right bundle branch block   . 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 Nocturia   . OSA on CPAP   . Persistent atrial fibrillation       . 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 PVC's (premature ventricular contractions) 11/21/2015  . 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 Weakness of left leg    SECONDARY TO SURGICAL NERVE INJURY OF LEFT GOIN  . Wears glasses    Past Surgical History:  Procedure Laterality Date  . ATRIAL FIBRILLATION ABLATION N/A 06/08/2018    Procedure: ATRIAL FIBRILLATION ABLATION;  Surgeon: Thompson Grayer, MD;  Location: [facility];  Service: Cardiovascular;  Laterality: N/A;  . CARDIAC CATHETERIZATION  08-12-2003  dr Tressia Miners turner   Normal coronary arteries, normal wall motion, no sig. abnormalities  . CARDIOVERSION N/A 08/04/2015   Procedure: CARDIOVERSION;  Surgeon: [name], MD;  Location: [facility];  Service: Cardiovascular;  Laterality: N/A;  . CARDIOVERSION N/A 10/28/2017   Procedure: CARDIOVERSION;  Surgeon: Larey Dresser, MD;  Location: [facility] ENDOSCOPY;  Service: Cardiovascular;  Laterality: N/A;  . CARDIOVERSION N/A 04/12/2018   Procedure: CARDIOVERSION;  Surgeon: Thayer Headings, MD;  Location: [facility] ENDOSCOPY;  Service: Cardiovascular;  Laterality: N/A;  . CARDIOVERSION N/A 07/26/2018   Procedure: CARDIOVERSION;  Surgeon: Larey Dresser, MD;  Location: [facility] ENDOSCOPY;  Service: Cardiovascular;  Laterality: N/A;  . CARDIOVERSION N/A 10/09/2018   Procedure: CARDIOVERSION;  Surgeon: Sueanne Margarita, MD;  Location: [facility] ENDOSCOPY;  Service: Cardiovascular;  Laterality: N/A;  . COLONOSCOPY  07-03-2002  . [place]  . FRACTURE SURGERY    . GROIN DISSECTION Left 1992   [place]   SARCOMA SURGERY  . INGUINAL HERNIA REPAIR Right 1950s?  . INTESTINAL BYPASS  1976   GASTRIC FOR OBESITY  . JOINT REPLACEMENT    . PATELLA FRACTURE SURGERY Left ~ 1995   "broke it twice; only had OR once"  . PROSTATE BIOPSY  06/2014  . RADIOACTIVE SEED IMPLANT N/A 10/17/2014   Procedure: RADIOACTIVE SEED IMPLANT    ;  Surgeon: Ailene Rud, MD;  Location: [facility];  Service: Urology;  Laterality: N/A;   68 SEEDS IMPLANTED   . TEE WITHOUT CARDIOVERSION N/A 10/28/2017   Procedure: TRANSESOPHAGEAL ECHOCARDIOGRAM (TEE);  Surgeon: Larey Dresser, MD;  Location: [facility] ENDOSCOPY;  Service: Cardiovascular;  Laterality: N/A;  . TONSILLECTOMY  1950s  . TOTAL KNEE ARTHROPLASTY Right 12/17/2014    Procedure: TOTAL KNEE ARTHROPLASTY;  Surgeon: Melrose Nakayama, MD;  Location: [facility];  Service: Orthopedics;  Laterality: Right;  . TRANSTHORACIC ECHOCARDIOGRAM  08-08-2003   moderate LVH/  ef 55-65%/  mild MR/  moderate LAE/  trivial TR/  trivial pericardial effusion posterior to the heart  . [place]  . VIDEO BRONCHOSCOPY Bilateral 09/21/2016   Procedure: VIDEO BRONCHOSCOPY WITHOUT FLUORO;  Surgeon: Collene Gobble, MD;  Location: [facility];  Service: Cardiopulmonary;  Laterality: Bilateral;     Current Outpatient Medications  Medication Sig Dispense Refill  . acetaminophen (TYLENOL) 500 MG tablet Take 1,000 mg by mouth every 6 (six) hours as needed for moderate pain or headache.    . albuterol (PROVENTIL HFA;VENTOLIN HFA) 108 (90 Base) MCG/ACT inhaler Inhale 2 puffs into the lungs every  6 (six) hours as needed for wheezing or shortness of breath.    [name] apixaban (ELIQUIS) 5 MG TABS tablet Take 1 tablet (5 mg total) by mouth 2 (two) times daily.    . bumetanide (BUMEX) 2 MG tablet Take 0.5 tablets (1 mg total) by mouth daily.    [name] buPROPion (WELLBUTRIN XL) 300 MG 24 hr tablet Take 300 mg by mouth every morning.    . clotrimazole (LOTRIMIN) 1 % cream Apply 1 application topically 2 (two) times daily as needed (itching).    . Coenzyme Q10 (COQ10) 100 MG CAPS Take 100 mg by mouth every evening.     . cyanocobalamin (,VITAMIN B-12,) 1000 MCG/ML injection Inject 1,000 mcg into the muscle every 30 (thirty) days.    [name] diltiazem (TIAZAC) 180 MG 24 hr capsule Take 1 capsule (180 mg total) by mouth every morning. 14 capsule 1  . escitalopram (LEXAPRO) 10 MG tablet Take 10 mg by mouth daily.    [name] losartan (COZAAR) 25 MG tablet Take 1 tablet (25 mg total) by mouth daily. 90 tablet 3  . magnesium oxide (MAG-OX) 400 MG tablet Take 400 mg by mouth daily.    . metolazone (ZAROXOLYN) 2.5 MG tablet Take 2.5 mg by mouth daily as needed (for fluid retention).     . metoprolol tartrate  (LOPRESSOR) 50 MG tablet Take 1 tablet (50 mg total) by mouth 2 (two) times daily. 180 tablet 3  . Polyethyl Glycol-Propyl Glycol (SYSTANE ULTRA) 0.4-0.3 % SOLN Place 1 drop into both eyes daily as needed (for dry eyes).    . potassium chloride SA (K-DUR,KLOR-CON) 20 MEQ tablet TAKE 2 TABLETS BY MOUTH DAILY. 180 tablet 2  . tamsulosin (FLOMAX) 0.4 MG CAPS capsule Take 0.8 mg by mouth at bedtime.      No current facility-administered medications for this encounter.     Allergies:   Ace inhibitors and Xarelto [rivaroxaban]   Social History:  The patient  reports that he has never smoked. He has never used smokeless tobacco. He reports that he does not drink alcohol or use drugs.   Family History:  The patient's  family history includes Atrial fibrillation in his father; Cancer in his sister; Liver disease in his mother; Renal Disease in his mother; Stroke in his mother.    ROS:  Please see the history of present illness.   All other systems are personally reviewed and negative.   Exam: Well appearing, alert and conversant, regular work of breathing,  good skin color  Recent Labs: 05/09/2018: Magnesium 1.5 11/01/2018: ALT 28; BUN 17; Creatinine, Ser 0.87; Hemoglobin 11.9; Platelets 199; Potassium 4.1; Sodium 140; TSH 3.519  personally reviewed    Other studies personally reviewed: Epic records including Dr. Jackalyn Lombard last note  The patient presents wearable device technology report for my review today. On my review, the patient presents   Apple Watch tracing The tracings reveals afib or variable flutter    ASSESSMENT AND PLAN:  1.  Persistent atrial fibrillation/flutter Pt is currently rate controlled at rest but has spikes in HR with exertion which makes him unable to walk for any distance and causes increased shortness of breath Comfortable at rest with HR's in the 95'M His systolic BP's are soft and I am hesitate to increase rate control because of this and being rate controlled  at rest We discussed options again He is interested in repeat ablation or tikosyn admit, the best approach he will leave up to Dr. Rayann Heman, ( will not  be off  amiodarone x 3 months until mid June) and currently Covid 19 elective procedure restrictions are still in place I do see some potential drug drug interactions of current meds and tikosyn and these would have to be addressed prior to load if that is the plan off choice   Continue  CCB/BB without change in dose  Continue eliquis 5 mg bid   This patients CHA2DS2-VASc Score and unadjusted Ischemic Stroke Rate (% per year) is equal to 4.8 % stroke rate/year from a score of 4  Above score calculated as 1 point each if present [CHF, HTN, DM, Vascular=MI/PAD/Aortic Plaque, Age if 65-74, or Male] Above score calculated as 2 points each if present [Age > 75, or Stroke/TIA/TE]  2.Chronic diastilkic HF Pt is weighing daily and using metolazone prn to keep weight around 282 lbs( this am's weight)  COVID screen The patient does not have any symptoms that suggest any further testing/ screening at this time.  Social distancing reinforced today.    Follow-up:  I will send my note to Dr. Rayann Heman and get his advise re repeat ablation vrs tikosyn when covid 19 restrictions allow  Current medicines are reviewed at length with the patient today.   The patient does not have concerns regarding his medicines.  The following changes were made today:  none  Labs/ tests ordered today include: none No orders of the defined types were placed in this encounter.   Patient Risk:  after full review of this patients clinical status, I feel that they are at high risk at this time.   Today, I have spent  30 minutes( 10 mins prep work, 20 minutes face to face) with the patient with telehealth technology discussing arrhythmia/HR's   Signed, Roderic Palau NP 12/08/2018 9:24 AM  Afib [facility] [address] [place], [place] [address]  [phone]   I hereby voluntarily request, consent and authorize the [facility] and its employed or contracted physicians, physician assistants, nurse practitioners or other licensed health care professionals (the Practitioner), to provide me with telemedicine health care services (the "Services") as deemed necessary by the treating Practitioner. I acknowledge and consent to receive the Services by the Practitioner via telemedicine. I understand that the telemedicine visit will involve communicating with the Practitioner through live audiovisual communication technology and the disclosure of certain medical information by electronic transmission. I acknowledge that I have been given the opportunity to request an in-person assessment or other available alternative prior to the telemedicine visit and am voluntarily participating in the telemedicine visit.   I understand that I have the right to withhold or withdraw my consent to the use of telemedicine in the course of my care at any time, without affecting my right to future care or treatment, and that the Practitioner or I may terminate the telemedicine visit at any time. I understand that I have the right to inspect all information obtained and/or recorded in the course of the telemedicine visit and may receive copies of available information for a reasonable fee.  I understand that some of the potential risks of receiving the Services via telemedicine include:   Delay or interruption in medical evaluation due to technological equipment failure or disruption;  Information transmitted may not be sufficient (e.g. poor resolution of images) to allow for appropriate medical decision making by the Practitioner; and/or  In rare instances, security protocols could fail, causing a breach of personal health information.   Furthermore, I acknowledge that it  is my responsibility to provide information about my medical history, conditions and care  that is complete and accurate to the best of my ability. I acknowledge that Practitioner's advice, recommendations, and/or decision may be based on factors not within their control, such as incomplete or inaccurate data provided by me or distortions of diagnostic images or specimens that may result from electronic transmissions. I understand that the practice of medicine is not an exact science and that Practitioner makes no warranties or guarantees regarding treatment outcomes. I acknowledge that I will receive a copy of this consent concurrently upon execution via email to the email address I last provided but may also request a printed copy by calling the office of the [facility].  I understand that my insurance will be billed for this visit.   I have read or had this consent read to me.  I understand the contents of this consent, which adequately explains the benefits and risks of the Services being provided via telemedicine.  I have been provided ample opportunity to ask questions regarding this consent and the Services and have had my questions answered to my satisfaction.  I give my informed consent for the services to be provided through the use of telemedicine in my medical care  By participating in this telemedicine visit I agree to the above.

## 2018-12-08 NOTE — Progress Notes (Signed)
We are sorry that you are not feeling well.  Here is how we plan to help!  Based on your presentation I believe you most likely have A cough due to a virus.  This is called viral bronchitis and is best treated by rest, plenty of fluids and control of the cough.  You may use Ibuprofen or Tylenol as directed to help your symptoms.     In addition you may use A prescription cough medication called Tessalon Perles 100mg . You may take 1-2 capsules every 8 hours as needed for your cough. You can also take over the counter Mucinex , but this is an expectorant and will help you cough any mucus up.   From your responses in the eVisit questionnaire you describe inflammation in the upper respiratory tract which is causing a significant cough.  This is commonly called Bronchitis and has four common causes:   Allergies Viral Infections Acid Reflux Bacterial Infection Allergies, viruses and acid reflux are treated by controlling symptoms or eliminating the cause. An example might be a cough caused by taking certain blood pressure medications. You stop the cough by changing the medication. Another example might be a cough caused by acid reflux. Controlling the reflux helps control the cough.     HOME CARE Only take medications as instructed by your medical team. Complete the entire course of an antibiotic. Drink plenty of fluids and get plenty of rest. Avoid close contacts especially the very young and the elderly Cover your mouth if you cough or cough into your sleeve. Always remember to wash your hands A steam or ultrasonic humidifier can help congestion.   GET HELP RIGHT AWAY IF: You develop worsening fever. You become short of breath You cough up blood. Your symptoms persist after you have completed your treatment plan MAKE SURE YOU  Understand these instructions. Will watch your condition. Will get help right away if you are not doing well or get worse.  Your e-visit answers were reviewed by a  board certified advanced clinical practitioner to complete your personal care plan.  Depending on the condition, your plan could have included both over the counter or prescription medications. If there is a problem please reply once you have received a response from your provider. Your safety is important to [place].  If you have drug allergies check your prescription carefully.    You can use MyChart to ask questions about today's visit, request a non-urgent call back, or ask for a work or school excuse for 24 hours related to this e-Visit. If it has been greater than 24 hours you will need to follow up with your provider, or enter a new e-Visit to address those concerns. You will get an e-mail in the next two days asking about your experience.  I hope that your e-visit has been valuable and will speed your recovery. Thank you for using e-visits.   ===View-only below this line===   ----- Message -----    From: [name]    Sent: 12/08/2018  9:30 AM EDT      To: E-Visit Mailing List Subject: E-Visit Submission: Upper Respiratory Infection  E-Visit Submission: Upper Respiratory Infection --------------------------------  Question: Which of the following are you experiencing? Answer:   Cough  Question: How long have you been having these symptoms? Answer:   6 days  Question: How long have you been coughing? Answer:   6 days  Question: How would you describe the cough? Answer:   None of the  Above  Question: Does the cough prevent you from sleeping at night? Answer:   No  Question: How often are you coughing? Answer:   None of the above  Question: Do you have a fever? Answer:   No, I do not have a fever  Question: Are you in close contact with anyone who has similar symptoms ? Answer:   No  Question: Are you treated for any of the following conditions: Asthma, COPD, Diabetes, Renal Failure (on Dialysis), AIDS, any Neuromuscular disease that effects the clearing of  secretions, Heart Failure, or Heart Disease? Answer:   No  Question: Are you taking any over the counter medications for your symptoms? Answer:   No  Question: Please list your medication allergies that you may have ? (If 'none' , please list as 'none') Answer:   None  Question: Please list any additional comments  Answer:     I have spent 5 minutes in review of e-visit questionnaire, review and updating patient chart, medical decision making and response to patient.    Tenna Delaine, PA-C

## 2018-12-13 ENCOUNTER — Encounter (HOSPITAL_COMMUNITY): Payer: Self-pay

## 2018-12-15 ENCOUNTER — Inpatient Hospital Stay (HOSPITAL_COMMUNITY)
Admission: RE | Admit: 2018-12-15 | Payer: BC Managed Care – PPO | Source: Ambulatory Visit | Admitting: Nurse Practitioner

## 2018-12-15 ENCOUNTER — Telehealth: Payer: Self-pay

## 2018-12-15 DIAGNOSIS — I4819 Other persistent atrial fibrillation: Secondary | ICD-10-CM

## 2018-12-15 NOTE — Telephone Encounter (Signed)
Call placed to Pt.  Pt to be scheduled for afib ablation on May 6 at 8:30 am with Dr. Rayann Heman.  Will need intraop TEE prior.   Work up complete

## 2018-12-18 ENCOUNTER — Other Ambulatory Visit: Payer: Self-pay

## 2018-12-18 ENCOUNTER — Other Ambulatory Visit: Payer: Medicare Other | Admitting: *Deleted

## 2018-12-18 DIAGNOSIS — I4819 Other persistent atrial fibrillation: Secondary | ICD-10-CM

## 2018-12-19 ENCOUNTER — Other Ambulatory Visit: Payer: BC Managed Care – PPO

## 2018-12-19 ENCOUNTER — Telehealth: Payer: Self-pay | Admitting: Internal Medicine

## 2018-12-19 LAB — CBC WITH DIFFERENTIAL/PLATELET
Basophils Absolute: 0 10*3/uL (ref 0.0–0.2)
Basos: 0 %
EOS (ABSOLUTE): 0.2 10*3/uL (ref 0.0–0.4)
Eos: 3 %
Hematocrit: 37.3 % — ABNORMAL LOW (ref 37.5–51.0)
Hemoglobin: 11.3 g/dL — ABNORMAL LOW (ref 13.0–17.7)
Immature Grans (Abs): 0 10*3/uL (ref 0.0–0.1)
Immature Granulocytes: 0 %
Lymphocytes Absolute: 1.8 10*3/uL (ref 0.7–3.1)
Lymphs: 28 %
MCH: 28.5 pg (ref 26.6–33.0)
MCHC: 30.3 g/dL — ABNORMAL LOW (ref 31.5–35.7)
MCV: 94 fL (ref 79–97)
Monocytes Absolute: 0.5 10*3/uL (ref 0.1–0.9)
Monocytes: 8 %
Neutrophils Absolute: 3.9 10*3/uL (ref 1.4–7.0)
Neutrophils: 61 %
Platelets: 192 10*3/uL (ref 150–450)
RBC: 3.97 x10E6/uL — ABNORMAL LOW (ref 4.14–5.80)
RDW: 16.3 % — ABNORMAL HIGH (ref 11.6–15.4)
WBC: 6.5 10*3/uL (ref 3.4–10.8)

## 2018-12-19 LAB — BASIC METABOLIC PANEL
BUN/Creatinine Ratio: 22 (ref 10–24)
BUN: 22 mg/dL (ref 8–27)
CO2: 23 mmol/L (ref 20–29)
Calcium: 8.8 mg/dL (ref 8.6–10.2)
Chloride: 105 mmol/L (ref 96–106)
Creatinine, Ser: 1 mg/dL (ref 0.76–1.27)
GFR calc Af Amer: 85 mL/min/{1.73_m2} (ref >59)
GFR calc non Af Amer: 73 mL/min/{1.73_m2} (ref >59)
Glucose: 105 mg/dL — ABNORMAL HIGH (ref 65–99)
Potassium: 5.1 mmol/L (ref 3.5–5.2)
Sodium: 143 mmol/L (ref 134–144)

## 2018-12-19 NOTE — Telephone Encounter (Signed)
New Message   Patient is calling in reference to his upcoming ablation. His main question is whether or not his wife will be able to come with him. Please call.

## 2018-12-19 NOTE — Anesthesia Preprocedure Evaluation (Addendum)
Anesthesia Evaluation  Patient identified by MRN, date of birth, ID band Patient awake    Reviewed: Allergy & Precautions, NPO status , Patient's Chart, lab work & pertinent test results, reviewed documented beta blocker date and time   Airway Mallampati: I  TM Distance: >3 FB Neck ROM: Full    Dental no notable dental hx. (+) Teeth Intact, Dental Advisory Given   Pulmonary asthma , sleep apnea and Continuous Positive Airway Pressure Ventilation ,    Pulmonary exam normal breath sounds clear to auscultation       Cardiovascular hypertension, Pt. on home beta blockers and Pt. on medications +CHF  Normal cardiovascular exam+ dysrhythmias (on eliquis, s/p ablation 05/2018) Atrial Fibrillation  Rhythm:Regular Rate:Normal  TEE 2019 EF 55-60%, mild AI, mild to mod MR  Stress Test 2019 The left ventricular ejection fraction is normal (55-65%). Nuclear stress EF: 60%. There was no ST segment deviation noted during stress. The study is normal. This is a low risk study.   Neuro/Psych PSYCHIATRIC DISORDERS Depression negative neurological ROS     GI/Hepatic negative GI ROS, Neg liver ROS,   Endo/Other  Morbid obesity  Renal/GU negative Renal ROS  negative genitourinary   Musculoskeletal  (+) Arthritis ,   Abdominal   Peds  Hematology negative hematology ROS (+)   Anesthesia Other Findings   Reproductive/Obstetrics                            Anesthesia Physical Anesthesia Plan  ASA: III  Anesthesia Plan: General   Post-op Pain Management:    Induction: Intravenous  PONV Risk Score and Plan: 2 and Ondansetron and Dexamethasone  Airway Management Planned: Oral ETT  Additional Equipment:   Intra-op Plan:   Post-operative Plan: Extubation in OR  Informed Consent: I have reviewed the patients History and Physical, chart, labs and discussed the procedure including the risks, benefits  and alternatives for the proposed anesthesia with the patient or authorized representative who has indicated his/her understanding and acceptance.     Dental advisory given  Plan Discussed with: CRNA  Anesthesia Plan Comments:         Anesthesia Quick Evaluation

## 2018-12-20 ENCOUNTER — Ambulatory Visit (HOSPITAL_COMMUNITY): Payer: Medicare Other | Admitting: Anesthesiology

## 2018-12-20 ENCOUNTER — Ambulatory Visit (HOSPITAL_COMMUNITY)
Admission: RE | Admit: 2018-12-20 | Discharge: 2018-12-20 | Disposition: A | Discharge status: Home / Self Care | Payer: Medicare Other | Attending: Internal Medicine | Admitting: Internal Medicine

## 2018-12-20 ENCOUNTER — Other Ambulatory Visit: Payer: Self-pay

## 2018-12-20 ENCOUNTER — Ambulatory Visit (HOSPITAL_BASED_OUTPATIENT_CLINIC_OR_DEPARTMENT_OTHER): Payer: Medicare Other

## 2018-12-20 ENCOUNTER — Encounter (HOSPITAL_COMMUNITY)
Admission: RE | Disposition: A | Discharge status: Home / Self Care | Payer: Self-pay | Source: Home / Self Care | Attending: Internal Medicine

## 2018-12-20 DIAGNOSIS — G4733 Obstructive sleep apnea (adult) (pediatric): Secondary | ICD-10-CM | POA: Insufficient documentation

## 2018-12-20 DIAGNOSIS — E291 Testicular hypofunction: Secondary | ICD-10-CM | POA: Insufficient documentation

## 2018-12-20 DIAGNOSIS — I110 Hypertensive heart disease with heart failure: Secondary | ICD-10-CM | POA: Diagnosis not present

## 2018-12-20 DIAGNOSIS — Z8249 Family history of ischemic heart disease and other diseases of the circulatory system: Secondary | ICD-10-CM | POA: Diagnosis not present

## 2018-12-20 DIAGNOSIS — Z79899 Other long term (current) drug therapy: Secondary | ICD-10-CM | POA: Diagnosis not present

## 2018-12-20 DIAGNOSIS — I4510 Unspecified right bundle-branch block: Secondary | ICD-10-CM | POA: Diagnosis not present

## 2018-12-20 DIAGNOSIS — M1990 Unspecified osteoarthritis, unspecified site: Secondary | ICD-10-CM | POA: Insufficient documentation

## 2018-12-20 DIAGNOSIS — I4891 Unspecified atrial fibrillation: Secondary | ICD-10-CM | POA: Diagnosis not present

## 2018-12-20 DIAGNOSIS — I4819 Other persistent atrial fibrillation: Secondary | ICD-10-CM | POA: Diagnosis present

## 2018-12-20 DIAGNOSIS — Z7901 Long term (current) use of anticoagulants: Secondary | ICD-10-CM | POA: Insufficient documentation

## 2018-12-20 DIAGNOSIS — I484 Atypical atrial flutter: Secondary | ICD-10-CM | POA: Diagnosis not present

## 2018-12-20 DIAGNOSIS — Z9884 Bariatric surgery status: Secondary | ICD-10-CM | POA: Diagnosis not present

## 2018-12-20 DIAGNOSIS — I5032 Chronic diastolic (congestive) heart failure: Secondary | ICD-10-CM | POA: Diagnosis not present

## 2018-12-20 DIAGNOSIS — I493 Ventricular premature depolarization: Secondary | ICD-10-CM | POA: Diagnosis not present

## 2018-12-20 DIAGNOSIS — Z823 Family history of stroke: Secondary | ICD-10-CM | POA: Diagnosis not present

## 2018-12-20 HISTORY — PX: ATRIAL FIBRILLATION ABLATION: EP1191

## 2018-12-20 LAB — POCT ACTIVATED CLOTTING TIME
Activated Clotting Time: 235 seconds
Activated Clotting Time: 279 seconds

## 2018-12-20 SURGERY — ATRIAL FIBRILLATION ABLATION
Anesthesia: General

## 2018-12-20 MED ORDER — ACETAMINOPHEN 500 MG PO TABS
ORAL_TABLET | ORAL | Status: AC
  Filled 2018-12-20: qty 2

## 2018-12-20 MED ORDER — ACETAMINOPHEN 500 MG PO TABS
1000.0000 mg | ORAL_TABLET | Freq: Once | ORAL | Status: DC
  Filled 2018-12-20: qty 2

## 2018-12-20 MED ORDER — SODIUM CHLORIDE 0.9 % IV SOLN
INTRAVENOUS | Status: DC | PRN
  Administered 2018-12-20: 20 ug/min via INTRAVENOUS

## 2018-12-20 MED ORDER — PANTOPRAZOLE SODIUM 40 MG PO TBEC: 40.0000 mg | DELAYED_RELEASE_TABLET | Freq: Every day | ORAL | 0 refills | Status: DC

## 2018-12-20 MED ORDER — PROPOFOL 10 MG/ML IV BOLUS
INTRAVENOUS | Status: DC | PRN
  Administered 2018-12-20: 100 mg via INTRAVENOUS

## 2018-12-20 MED ORDER — ACETAMINOPHEN 325 MG PO TABS: 650.0000 mg | ORAL_TABLET | ORAL | Status: DC | PRN

## 2018-12-20 MED ORDER — SUGAMMADEX SODIUM 200 MG/2ML IV SOLN
INTRAVENOUS | Status: DC | PRN
  Administered 2018-12-20: 300 mg via INTRAVENOUS

## 2018-12-20 MED ORDER — SODIUM CHLORIDE 0.9 % IV SOLN: 250.0000 mL | INTRAVENOUS | Status: DC | PRN

## 2018-12-20 MED ORDER — MIDAZOLAM HCL 5 MG/5ML IJ SOLN
INTRAMUSCULAR | Status: DC | PRN
  Administered 2018-12-20: 2 mg via INTRAVENOUS

## 2018-12-20 MED ORDER — ONDANSETRON HCL 4 MG/2ML IJ SOLN: 4.0000 mg | Freq: Four times a day (QID) | INTRAMUSCULAR | Status: DC | PRN

## 2018-12-20 MED ORDER — ISOPROTERENOL HCL 0.2 MG/ML IJ SOLN
INTRAMUSCULAR | Status: AC
  Filled 2018-12-20: qty 5

## 2018-12-20 MED ORDER — ONDANSETRON HCL 4 MG/2ML IJ SOLN
INTRAMUSCULAR | Status: DC | PRN
  Administered 2018-12-20: 4 mg via INTRAVENOUS

## 2018-12-20 MED ORDER — HEPARIN (PORCINE) IN NACL 1000-0.9 UT/500ML-% IV SOLN
INTRAVENOUS | Status: DC | PRN
  Administered 2018-12-20: 500 mL

## 2018-12-20 MED ORDER — PROTAMINE SULFATE 10 MG/ML IV SOLN
INTRAVENOUS | Status: DC | PRN
  Administered 2018-12-20: 40 mg via INTRAVENOUS

## 2018-12-20 MED ORDER — SUCCINYLCHOLINE CHLORIDE 200 MG/10ML IV SOSY
PREFILLED_SYRINGE | INTRAVENOUS | Status: DC | PRN
  Administered 2018-12-20: 200 mg via INTRAVENOUS

## 2018-12-20 MED ORDER — LACTATED RINGERS IV SOLN
INTRAVENOUS | Status: DC | PRN
  Administered 2018-12-20: 08:00:00 via INTRAVENOUS

## 2018-12-20 MED ORDER — ISOPROTERENOL HCL 0.2 MG/ML IJ SOLN
INTRAVENOUS | Status: DC | PRN
  Administered 2018-12-20: 20 ug/min via INTRAVENOUS

## 2018-12-20 MED ORDER — SODIUM CHLORIDE 0.9% FLUSH: 3.0000 mL | Freq: Two times a day (BID) | INTRAVENOUS | Status: DC

## 2018-12-20 MED ORDER — LIDOCAINE 2% (20 MG/ML) 5 ML SYRINGE
INTRAMUSCULAR | Status: DC | PRN
  Administered 2018-12-20: 100 mg via INTRAVENOUS

## 2018-12-20 MED ORDER — FENTANYL CITRATE (PF) 250 MCG/5ML IJ SOLN
INTRAMUSCULAR | Status: DC | PRN
  Administered 2018-12-20 (×2): 50 ug via INTRAVENOUS

## 2018-12-20 MED ORDER — SODIUM CHLORIDE 0.9 % IV SOLN
INTRAVENOUS | Status: DC
  Administered 2018-12-20: 07:00:00 via INTRAVENOUS

## 2018-12-20 MED ORDER — SODIUM CHLORIDE 0.9% FLUSH: 3.0000 mL | INTRAVENOUS | Status: DC | PRN

## 2018-12-20 MED ORDER — HEPARIN (PORCINE) IN NACL 1000-0.9 UT/500ML-% IV SOLN
INTRAVENOUS | Status: AC
  Filled 2018-12-20: qty 500

## 2018-12-20 MED ORDER — BUPIVACAINE HCL (PF) 0.25 % IJ SOLN
INTRAMUSCULAR | Status: DC | PRN
  Administered 2018-12-20: 30 mL

## 2018-12-20 MED ORDER — HEPARIN SODIUM (PORCINE) 1000 UNIT/ML IJ SOLN
INTRAMUSCULAR | Status: DC | PRN
  Administered 2018-12-20: 5000 [IU] via INTRAVENOUS

## 2018-12-20 MED ORDER — HEPARIN SODIUM (PORCINE) 1000 UNIT/ML IJ SOLN
INTRAMUSCULAR | Status: DC | PRN
  Administered 2018-12-20: 1000 [IU] via INTRAVENOUS
  Administered 2018-12-20: 12000 [IU] via INTRAVENOUS

## 2018-12-20 MED ORDER — APIXABAN 5 MG PO TABS
5.0000 mg | ORAL_TABLET | Freq: Two times a day (BID) | ORAL | Status: AC
  Administered 2018-12-20: 5 mg via ORAL
  Filled 2018-12-20: qty 1

## 2018-12-20 MED ORDER — ROCURONIUM BROMIDE 50 MG/5ML IV SOSY
PREFILLED_SYRINGE | INTRAVENOUS | Status: DC | PRN
  Administered 2018-12-20: 50 mg via INTRAVENOUS
  Administered 2018-12-20: 10 mg via INTRAVENOUS
  Administered 2018-12-20: 30 mg via INTRAVENOUS

## 2018-12-20 MED ORDER — DEXAMETHASONE SODIUM PHOSPHATE 4 MG/ML IJ SOLN
INTRAMUSCULAR | Status: DC | PRN
  Administered 2018-12-20: 8 mg via INTRAVENOUS

## 2018-12-20 MED ORDER — HYDROCODONE-ACETAMINOPHEN 5-325 MG PO TABS
1.0000 | ORAL_TABLET | ORAL | Status: DC | PRN
  Administered 2018-12-20: 13:00:00 1 via ORAL

## 2018-12-20 MED ORDER — HEPARIN SODIUM (PORCINE) 1000 UNIT/ML IJ SOLN
INTRAMUSCULAR | Status: AC
  Filled 2018-12-20: qty 2

## 2018-12-20 MED ORDER — BUPIVACAINE HCL (PF) 0.25 % IJ SOLN
INTRAMUSCULAR | Status: AC
  Filled 2018-12-20: qty 30

## 2018-12-20 MED ORDER — HYDROCODONE-ACETAMINOPHEN 5-325 MG PO TABS
ORAL_TABLET | ORAL | Status: AC
  Filled 2018-12-20: qty 1

## 2018-12-20 MED ORDER — APIXABAN 5 MG PO TABS: 5.0000 mg | ORAL_TABLET | Freq: Once | ORAL | Status: DC

## 2018-12-20 SURGICAL SUPPLY — 17 items
BLANKET WARM UNDERBOD FULL ACC (MISCELLANEOUS) ×1 IMPLANT
CATH MAPPNG PENTARAY F 2-6-2MM (CATHETERS) IMPLANT
CATH NAVISTAR SMARTTOUCH DF (ABLATOR) ×1 IMPLANT
CATH SOUNDSTAR ECO 8FR (CATHETERS) ×1 IMPLANT
CATH WEBSTER BI DIR CS D-F CRV (CATHETERS) ×1 IMPLANT
COVER SWIFTLINK CONNECTOR (BAG) ×1 IMPLANT
NDL BAYLIS TRANSSEPTAL 71CM (NEEDLE) IMPLANT
NEEDLE BAYLIS TRANSSEPTAL 71CM (NEEDLE) ×2 IMPLANT
PACK EP LATEX FREE (CUSTOM PROCEDURE TRAY) ×2
PACK EP LF (CUSTOM PROCEDURE TRAY) ×1 IMPLANT
PAD PRO RADIOLUCENT 2001M-C (PAD) ×2 IMPLANT
PATCH CARTO3 (PAD) ×1 IMPLANT
PENTARAY F 2-6-2MM (CATHETERS) ×2
SHEATH PINNACLE 7F 10CM (SHEATH) ×2 IMPLANT
SHEATH PINNACLE 9F 10CM (SHEATH) ×2 IMPLANT
SHEATH SWARTZ TS SL2 63CM 8.5F (SHEATH) ×1 IMPLANT
TUBING SMART ABLATE COOLFLOW (TUBING) ×1 IMPLANT

## 2018-12-20 NOTE — Interval H&P Note (Signed)
History and Physical Interval Note:  12/20/2018 7:47 AM  Eric Mayer  has presented today for surgery, with the diagnosis of Atrial Fibrillation.  The various methods of treatment have been discussed with the patient and family. After consideration of risks, benefits and other options for treatment, the patient has consented to  Procedure(s): ATRIAL FIBRILLATION ABLATION (N/A) as a surgical intervention.  The patient's history has been reviewed, patient examined, no change in status, stable for surgery.  I have reviewed the patient's chart and labs.  Questions were answered to the patient's satisfaction.    The patient has symptomatic, recurrent persistent atrial fibrillation and atypical atrial flutter.  He is s/p ablation 05/2018. he has failed medical therapy with amiodarone. Chads2vasc score is 4.  he is anticoagulated with eliquis.  He reports compliance with eliquis without interruption. Therapeutic strategies for afib including medicine (tikosyn) and ablation were discussed in detail with the patient today. Risk, benefits, and alternatives to TEE guided  EP study and radiofrequency ablation for afib were also discussed in detail today. These risks include but are not limited to stroke, bleeding, vascular damage, tamponade, perforation, damage to the esophagus, lungs, and other structures, pulmonary vein stenosis, worsening renal function, and death. The patient understands these risk and wishes to proceed.    Thompson Grayer MD, [place] 12/20/2018 7:51 AM

## 2018-12-20 NOTE — Transfer of Care (Signed)
Immediate Anesthesia Transfer of Care Note  Patient: Eric Mayer  Procedure(s) Performed: ATRIAL FIBRILLATION ABLATION (N/A )  Patient Location: PACU and Cath Lab  Anesthesia Type:General  Level of Consciousness: awake, alert  and oriented  Airway & Oxygen Therapy: Patient Spontanous Breathing and Patient connected to nasal cannula oxygen  Post-op Assessment: Report given to RN and Post -op Vital signs reviewed and stable  Post vital signs: Reviewed and stable  Last Vitals:  Vitals Value Taken Time  BP 130/46 12/20/2018 12:24 PM  Temp 36.4 C 12/20/2018 12:19 PM  Pulse 70 12/20/2018 12:25 PM  Resp 18 12/20/2018 12:25 PM  SpO2 97 % 12/20/2018 12:25 PM  Vitals shown include unvalidated device data.  Last Pain:  Vitals:   12/20/18 1219  TempSrc: Temporal  PainSc: 4       Patients Stated Pain Goal: 3 (18/40/37 5436)  Complications: No apparent anesthesia complications

## 2018-12-20 NOTE — Anesthesia Procedure Notes (Signed)
Procedure Name: Intubation Date/Time: 12/20/2018 9:17 AM Performed by: Marsa Aris, CRNA Pre-anesthesia Checklist: Patient identified, Emergency Drugs available, Suction available and Patient being monitored Patient Re-evaluated:Patient Re-evaluated prior to induction Oxygen Delivery Method: Circle System Utilized Preoxygenation: Pre-oxygenation with 100% oxygen Induction Type: IV induction Ventilation: Mask ventilation without difficulty Laryngoscope Size: Miller and 2 Grade View: Grade II Tube type: Oral Tube size: 7.5 mm Number of attempts: 1 Airway Equipment and Method: Stylet and Oral airway Placement Confirmation: ETT inserted through vocal cords under direct vision,  positive ETCO2 and breath sounds checked- equal and bilateral Secured at: 22 cm Tube secured with: Tape Dental Injury: Teeth and Oropharynx as per pre-operative assessment  Comments: No change in dentition from pre-procedure, c-spine neutrality maintained

## 2018-12-20 NOTE — Discharge Instructions (Signed)
Post procedure care instructions No driving for 4 days. No lifting over 5 lbs for 1 week. No vigorous or sexual activity for 1 week. You may return to work on 12/27/2018. Keep procedure site clean & dry. If you notice increased pain, swelling, bleeding or pus, call/return!  You may shower, but no soaking baths/hot tubs/pools for 1 week.   Cardiac Ablation, Care After This sheet gives you information about how to care for yourself after your procedure. Your health care provider may also give you more specific instructions. If you have problems or questions, contact your health care provider. What can I expect after the procedure? After the procedure, it is common to have:  Bruising around your puncture site.  Tenderness around your puncture site.  Skipped heartbeats.  Tiredness (fatigue). Follow these instructions at home: Puncture site care   Follow instructions from your health care provider about how to take care of your puncture site. Make sure you: ? Wash your hands with soap and water before you change your bandage (dressing). If soap and water are not available, use hand sanitizer. ? Change your dressing as told by your health care provider. ? Leave stitches (sutures), skin glue, or adhesive strips in place. These skin closures may need to stay in place for up to 2 weeks. If adhesive strip edges start to loosen and curl up, you may trim the loose edges. Do not remove adhesive strips completely unless your health care provider tells you to do that.  Check your puncture site every day for signs of infection. Check for: ? Redness, swelling, or pain. ? Fluid or blood. If your puncture site starts to bleed, lie down on your back, apply firm pressure to the area, and contact your health care provider. ? Warmth. ? Pus or a bad smell. Driving  Ask your health care provider when it is safe for you to drive again after the procedure.  Do not drive or use heavy machinery while taking  prescription pain medicine.  Do not drive for 24 hours if you were given a medicine to help you relax (sedative) during your procedure. Activity  Avoid activities that take a lot of effort for at least 3 days after your procedure.  Do not lift anything that is heavier than 10 lb (4.5 kg), or the limit that you are told, until your health care provider says that it is safe.  Return to your normal activities as told by your health care provider. Ask your health care provider what activities are safe for you. General instructions  Take over-the-counter and prescription medicines only as told by your health care provider.  Do not use any products that contain nicotine or tobacco, such as cigarettes and e-cigarettes. If you need help quitting, ask your health care provider.  Do not take baths, swim, or use a hot tub until your health care provider approves.  Do not drink alcohol for 24 hours after your procedure.  Keep all follow-up visits as told by your health care provider. This is important. Contact a health care provider if:  You have redness, mild swelling, or pain around your puncture site.  You have fluid or blood coming from your puncture site that stops after applying firm pressure to the area.  Your puncture site feels warm to the touch.  You have pus or a bad smell coming from your puncture site.  You have a fever.  You have chest pain or discomfort that spreads to your neck, jaw, or  arm.  You are sweating a lot.  You feel nauseous.  You have a fast or irregular heartbeat.  You have shortness of breath.  You are dizzy or light-headed and feel the need to lie down.  You have pain or numbness in the arm or leg closest to your puncture site. Get help right away if:  Your puncture site suddenly swells.  Your puncture site is bleeding and the bleeding does not stop after applying firm pressure to the area. These symptoms may represent a serious problem that is an  emergency. Do not wait to see if the symptoms will go away. Get medical help right away. Call your local emergency services (911 in the [place] Do not drive yourself to the hospital. Summary  After the procedure, it is normal to have bruising and tenderness at the puncture site in your groin, neck, or forearm.  Check your puncture site every day for signs of infection.  Get help right away if your puncture site is bleeding and the bleeding does not stop after applying firm pressure to the area. This is a medical emergency. This information is not intended to replace advice given to you by your health care provider. Make sure you discuss any questions you have with your health care provider. Document Released: 11/11/2016 Document Revised: 11/11/2016 Document Reviewed: 11/11/2016 Elsevier Interactive Patient Education  2019 [place] UP TO YOUR MY CHART ACCOUNT WHEN YOU GET HOME (if you aren't already).  IN THE CURRENT ENVIRONMENT WITH COVID-19, IN EFFORT TO REDUCE YOUR EXPOSURE WE WILL BE CONDUCTING MANY PATIENT VISITS BY EITHER VIRTUAL/VIDEO VISITS or TELEPHONE VISITS.  BEING SIGNED UP IN YOUR MY CHART ACCOUNT  WILL HELP FACILITATE THESE VISITS AND OUR COMMUNICATION WITH YOU   YOUR CARDIOLOGY TEAM HAS ARRANGED FOR AN E-VISIT FOR YOUR APPOINTMENT - PLEASE REVIEW IMPORTANT INFORMATION BELOW SEVERAL DAYS PRIOR TO YOUR APPOINTMENT  Due to the recent COVID-19 pandemic, we are transitioning in-person office visits to tele-medicine visits in an effort to decrease unnecessary exposure to our patients, their families, and staff. These visits are billed to your insurance just like a normal visit is. We also encourage you to sign up for MyChart if you have not already done so. You will need a smartphone if possible. For patients that do not have this, we can still complete the visit using a regular telephone but do prefer a smartphone to enable video when possible. You may have a family  member that lives with you that can help. If possible, we also ask that you have a blood pressure cuff and scale at home to measure your blood pressure, heart rate and weight prior to your scheduled appointment. Patients with clinical needs that need an in-person evaluation and testing will still be able to come to the office if absolutely necessary. If you have any questions, feel free to call our office.     YOUR PROVIDER WILL BE USING THE FOLLOWING PLATFORM TO COMPLETE YOUR VISIT:  [place]   IF USING [place] - How to Download the MyChart App to Your SmartPhone   - If Apple, go to [employer] and type in MyChart in the search bar and download the app. If Android, ask patient to go to [employer] and type in [place] in the search bar and download the app. The app is free but as with any other app downloads, your phone may require you to verify saved payment information or Apple/Android password.  -  You will need to then log into the app with your MyChart username and password, and select [facility] as your healthcare provider to link the account.  - When it is time for your visit, go to the MyChart app, find appointments, and click Begin Video Visit. Be sure to Select Allow for your device to access the Microphone and Camera for your visit. You will then be connected, and your provider will be with you shortly.  **If you have any issues connecting or need assistance, please contact MyChart service desk [phone])**  **If using a computer, in order to ensure the best quality for your visit, you will need to use either of the following Internet Browsers: Insurance underwriter or Microsoft Edge**   IF USING DOXIMITY or [email or web address].ME - The staff will give you instructions on receiving your link to join the meeting the day of your visit.      2-3 DAYS BEFORE YOUR APPOINTMENT  You will receive a telephone call from one of our [facility] team members - your caller ID may say "Unknown  caller." If this is a video visit, we will walk you through how to get the video launched on your phone. We will remind you check your blood pressure, heart rate and weight prior to your scheduled appointment. If you have an Apple Watch or Kardia, please upload any pertinent ECG strips the day before or morning of your appointment to [facility]. Our staff will also make sure you have reviewed the consent and agree to move forward with your scheduled tele-health visit.     THE DAY OF YOUR APPOINTMENT  Approximately 15 minutes prior to your scheduled appointment, you will receive a telephone call from one of [facility] team - your caller ID may say "Unknown caller."  Our staff will confirm medications, vital signs for the day and any symptoms you may be experiencing. Please have this information available prior to the time of visit start. It may also be helpful for you to have a pad of paper and pen handy for any instructions given during your visit. They will also walk you through joining the smartphone meeting if this is a video visit.    CONSENT FOR TELE-HEALTH VISIT - PLEASE REVIEW  I hereby voluntarily request, consent and authorize [facility] and its employed or contracted physicians, physician assistants, nurse practitioners or other licensed health care professionals (the Practitioner), to provide me with telemedicine health care services (the Services") as deemed necessary by the treating Practitioner. I acknowledge and consent to receive the Services by the Practitioner via telemedicine. I understand that the telemedicine visit will involve communicating with the Practitioner through live audiovisual communication technology and the disclosure of certain medical information by electronic transmission. I acknowledge that I have been given the opportunity to request an in-person assessment or other available alternative prior to the telemedicine visit and am voluntarily participating in the  telemedicine visit.  I understand that I have the right to withhold or withdraw my consent to the use of telemedicine in the course of my care at any time, without affecting my right to future care or treatment, and that the Practitioner or I may terminate the telemedicine visit at any time. I understand that I have the right to inspect all information obtained and/or recorded in the course of the telemedicine visit and may receive copies of available information for a reasonable fee.  I understand that some of the potential risks of receiving the Services via telemedicine include:  Delay or interruption in medical evaluation due to technological equipment failure or disruption;  Information transmitted may not be sufficient (e.g. poor resolution of images) to allow for appropriate medical decision making by the Practitioner; and/or   In rare instances, security protocols could fail, causing a breach of personal health information.  Furthermore, I acknowledge that it is my responsibility to provide information about my medical history, conditions and care that is complete and accurate to the best of my ability. I acknowledge that Practitioner's advice, recommendations, and/or decision may be based on factors not within their control, such as incomplete or inaccurate data provided by me or distortions of diagnostic images or specimens that may result from electronic transmissions. I understand that the practice of medicine is not an exact science and that Practitioner makes no warranties or guarantees regarding treatment outcomes. I acknowledge that I will receive a copy of this consent concurrently upon execution via email to the email address I last provided but may also request a printed copy by calling the office of [facility].    I understand that my insurance will be billed for this visit.   I have read or had this consent read to me.  I understand the contents of this consent, which  adequately explains the benefits and risks of the Services being provided via telemedicine.   I have been provided ample opportunity to ask questions regarding this consent and the Services and have had my questions answered to my satisfaction.  I give my informed consent for the services to be provided through the use of telemedicine in my medical care  By participating in this telemedicine visit I agree to the above.

## 2018-12-20 NOTE — Progress Notes (Signed)
  Echocardiogram Echocardiogram Transesophageal has been performed.  Eric Mayer 12/20/2018, 10:08 AM

## 2018-12-20 NOTE — Interval H&P Note (Signed)
History and Physical Interval Note:  12/20/2018 10:09 AM  Eric Mayer  has presented today for surgery, with the diagnosis of Atrial Fibrillation.  The various methods of treatment have been discussed with the patient and family. After consideration of risks, benefits and other options for treatment, the patient has consented to  Procedure(s): ATRIAL FIBRILLATION ABLATION (N/A) as a surgical intervention.  The patient's history has been reviewed, patient examined, no change in status, stable for surgery.  I have reviewed the patient's chart and labs.  Questions were answered to the patient's satisfaction.     Mertie Moores

## 2018-12-20 NOTE — CV Procedure (Signed)
    Transesophageal Echocardiogram Note  DHILAN BRAUER [id number] 1941/03/09  Procedure: Transesophageal Echocardiogram Indications: Atrial fib   Procedure Details Consent: Obtained Time Out: Verified patient identification, verified procedure, site/side was marked, verified correct patient position, special equipment/implants available, Radiology Safety Procedures followed,  medications/allergies/relevent history reviewed, required imaging and test results available.  Performed  Medications:  During this procedure the patient is administered a propofol drip by CRNA   Left Ventrical:   Normal LV function.  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   Thayer Headings, Brooke Bonito., MD, [facility] 12/20/2018, 10:09 AM

## 2018-12-20 NOTE — Progress Notes (Signed)
3 way stopcock and  Figure 8 stitch cautiously removed. No bleeding nor hematoma. Gauze and tegaderm dressing applied. Instructions reviewed w/patient.

## 2018-12-20 NOTE — Anesthesia Postprocedure Evaluation (Signed)
Anesthesia Post Note  Patient: Eric Mayer  Procedure(s) Performed: ATRIAL FIBRILLATION ABLATION (N/A )     Patient location during evaluation: PACU Anesthesia Type: General Level of consciousness: awake and alert Pain management: pain level controlled Vital Signs Assessment: post-procedure vital signs reviewed and stable Respiratory status: spontaneous breathing, nonlabored ventilation, respiratory function stable and patient connected to nasal cannula oxygen Cardiovascular status: blood pressure returned to baseline and stable Postop Assessment: no apparent nausea or vomiting Anesthetic complications: no    Last Vitals:  Vitals:   12/20/18 1600 12/20/18 1702  BP: (!) 127/48 (!) 114/45  Pulse: 64 65  Resp: (!) 22 11  Temp:    SpO2: 94% 94%    Last Pain:  Vitals:   12/20/18 1746  TempSrc:   PainSc: 0-No pain                 Calin Fantroy L Easten Maceachern

## 2018-12-20 NOTE — Progress Notes (Signed)
Run of vtach per cardiac monitoring, Dr Rayann Heman paged and will come later, pt well, no sx.

## 2018-12-21 ENCOUNTER — Encounter (HOSPITAL_COMMUNITY): Payer: Self-pay | Admitting: Internal Medicine

## 2018-12-21 ENCOUNTER — Telehealth: Payer: Self-pay | Admitting: Physician Assistant

## 2018-12-21 NOTE — Telephone Encounter (Signed)
Called to follow up with patient after his ablation procedure yesterday.  Reminded not to skip any Eliquis doses and to start/complete the Protonix regime.  He tells me he was given clear instruction yesterday about both, has started the protonix and already took his Eliquis this morning.   He is feeling very well.  When first up today had very mild ache/awareness of his procedure site, though this is resolved, he is ambulating without difficulty or discomfort.  No bleeding, bruising, swelling is noted, no tenderness or discoloration.  He denies any CP or SOB.  He has not had any palpitations, states his watch is able to given his a rhythm tracing and reports this AM when he got up was SR 80bpm. We revisited post procedure activity restrictions as well.  Had no follow up questions.  He is encouraged to call with any questions or concerns.  Tommye Standard, PA-C.

## 2018-12-23 ENCOUNTER — Telehealth: Payer: Self-pay | Admitting: Cardiology

## 2018-12-23 NOTE — Telephone Encounter (Signed)
   Last night felt heart flutter, HR up and Apple watch showed HR in 160's. Lasted for a few minutes. He went to bed. This am HR was 143 bpm. No shortness of breath, lightheadedness or chest discomfort.   He had taken Bumex for extra weight and lost ~11 lbs. He thought that dehydration could have caused his tachycardia. He took a potassium and magnesium this morning, drank fluids and laid down to rest. His HR is currently ~85 bpm in sinus rhythm per Apple watch. The flutters are gone. He is feeling fine now.   We discussed that he did the right thing and he will continue to monitor. If he needs he can take an extra dose of metoprolol if rest and fluids don't help. I advised him to call us back if he has any more trouble.  I will route this to Dr. Rayann Heman and Roderic Palau of the afib clinic.

## 2018-12-26 ENCOUNTER — Other Ambulatory Visit: Payer: Self-pay | Admitting: Cardiovascular Disease

## 2019-01-22 ENCOUNTER — Other Ambulatory Visit: Payer: Self-pay

## 2019-01-22 ENCOUNTER — Encounter (HOSPITAL_COMMUNITY): Payer: Self-pay | Admitting: Nurse Practitioner

## 2019-01-22 ENCOUNTER — Ambulatory Visit (HOSPITAL_COMMUNITY)
Admission: RE | Admit: 2019-01-22 | Discharge: 2019-01-22 | Disposition: A | Discharge status: Home / Self Care | Payer: Medicare Other | Source: Ambulatory Visit | Attending: Physician Assistant | Admitting: Physician Assistant

## 2019-01-22 VITALS — BP 110/72 | HR 103 | Ht 70.0 in | Wt 289.6 lb

## 2019-01-22 DIAGNOSIS — Z9884 Bariatric surgery status: Secondary | ICD-10-CM | POA: Diagnosis not present

## 2019-01-22 DIAGNOSIS — Z841 Family history of disorders of kidney and ureter: Secondary | ICD-10-CM | POA: Diagnosis not present

## 2019-01-22 DIAGNOSIS — G4733 Obstructive sleep apnea (adult) (pediatric): Secondary | ICD-10-CM | POA: Diagnosis not present

## 2019-01-22 DIAGNOSIS — Z79899 Other long term (current) drug therapy: Secondary | ICD-10-CM | POA: Diagnosis not present

## 2019-01-22 DIAGNOSIS — Z8711 Personal history of peptic ulcer disease: Secondary | ICD-10-CM | POA: Diagnosis not present

## 2019-01-22 DIAGNOSIS — Z8249 Family history of ischemic heart disease and other diseases of the circulatory system: Secondary | ICD-10-CM | POA: Diagnosis not present

## 2019-01-22 DIAGNOSIS — I4892 Unspecified atrial flutter: Secondary | ICD-10-CM | POA: Insufficient documentation

## 2019-01-22 DIAGNOSIS — Z823 Family history of stroke: Secondary | ICD-10-CM | POA: Insufficient documentation

## 2019-01-22 DIAGNOSIS — I4819 Other persistent atrial fibrillation: Secondary | ICD-10-CM | POA: Insufficient documentation

## 2019-01-22 DIAGNOSIS — Z888 Allergy status to other drugs, medicaments and biological substances status: Secondary | ICD-10-CM | POA: Diagnosis not present

## 2019-01-22 DIAGNOSIS — Z923 Personal history of irradiation: Secondary | ICD-10-CM | POA: Diagnosis not present

## 2019-01-22 DIAGNOSIS — F329 Major depressive disorder, single episode, unspecified: Secondary | ICD-10-CM | POA: Insufficient documentation

## 2019-01-22 DIAGNOSIS — I4891 Unspecified atrial fibrillation: Secondary | ICD-10-CM | POA: Diagnosis present

## 2019-01-22 DIAGNOSIS — Z85831 Personal history of malignant neoplasm of soft tissue: Secondary | ICD-10-CM | POA: Insufficient documentation

## 2019-01-22 DIAGNOSIS — Z7901 Long term (current) use of anticoagulants: Secondary | ICD-10-CM | POA: Diagnosis not present

## 2019-01-22 DIAGNOSIS — I480 Paroxysmal atrial fibrillation: Secondary | ICD-10-CM

## 2019-01-22 NOTE — Progress Notes (Signed)
Date:  01/22/2019   ID:  [name], DOB 1940-12-22, MRN [id number]  Location: home  Provider location: [address] [place], [place] [address] Evaluation Performed: exertional RVR  PCP:  [name], MD  Primary Cardiologist:   Dr. Oval Linsey Primary Electrophysiologist: Dr. Rayann Heman   CC: elevated exertional HR's   History of Present Illness: Eric Mayer is a 78 y.o. male who presents  for a f/u today s/p 2nd ablation 12/19/17.  The patient is being seen for HR issues. He has an ablation back in the fall, left on amiodarone although it was not keeping pt in optimal SR at the time. He had a cardioversion in January and then on f/u had return of flutter and was c/o shortness of breath. At that time amiodarone was stopped. He saw Dr. Rayann Heman f/u early April and options were repeat ablation, rate control or tikosyn. He has been off amiodarone since mid March now. He reported elevated exertional heart rates when he is up and walking around 130-170 but returns to rate control with rest. He mostly is in the 80's at rest. He also runs soft BP's with  systolic around 400, highest systolic gets is around 867 systolic.He weighs daily and can quickly retain fluid. He reports 8 lbs in one day last week and usually metolazone will get back down to his baseline weight which is around 282. He and his wife are preparing to move to a retirement center but this has been put on hold with CoVid 19.  He reports that he has had good days and bad days since  2nd ablation. He feels he is going in and out. He still has spikes in his HR any time he gets up and moves around.[name] He and his wife will be moving to a retirement center on Friday. They have been waiting for a unit to open there x 2 years.   Today, he denies symptoms of chest pain,  orthopnea, PND,  dizziness, presyncope, syncope, bleeding, or neurologic sequela.+ for exertional increase in HR, fatigue, shortness of breath when exertional.  The  patient is tolerating medications without difficulties and is otherwise without complaint today.   he denies symptoms of cough, fevers, chills, or new SOB worrisome for COVID 19.    Atrial Fibrillation Risk Factors:  he does have symptoms or diagnosis of sleep apnea. he is compliant with CPAP therapy. he does not have a history of rheumatic fever. he does not have a history of alcohol use. The patient  have a history of  atrial fibrillation in his father.   he has a BMI of Body mass index is 41.55 kg/m.[name] Filed Weights   01/22/19 1121  Weight: 131.4 kg    Past Medical History:  Diagnosis Date   Anemia 1980s X 1   Arthritis    "hands, knees, hips, ankles" (07/31/2015)   Atrial flutter ([place]) 07/31/2015   Chronic diastolic heart failure ([place])    a. 11/2015: Echo w/ EF of 60-65%, no WMA, Grade 2 DD, trivial AR, ascending aorta mildly dilated.    Depression    History of cardiovascular stress test    a. 03/2015: Dobutamine Stress Echo with no evidence of ischemia.    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   Gleason 7,  stage T1c   PVC's (premature ventricular contractions) 11/21/2015   Sarcoma ([place]) ~ 1993/1994   "of groin"   Weakness of left leg    SECONDARY TO SURGICAL NERVE INJURY OF LEFT GOIN   Wears glasses    Past Surgical History:  Procedure Laterality Date   ATRIAL FIBRILLATION ABLATION N/A 06/08/2018   Procedure: ATRIAL FIBRILLATION  ABLATION;  Surgeon: Thompson Grayer, MD;  Location: [place] [facility];  Service: Cardiovascular;  Laterality: N/A;   ATRIAL FIBRILLATION ABLATION N/A 12/20/2018   Procedure: ATRIAL FIBRILLATION ABLATION;  Surgeon: Thompson Grayer, MD;  Location: [place] [facility];  Service: Cardiovascular;  Laterality: N/A;   CARDIAC CATHETERIZATION  08-12-2003  dr Tressia Miners turner   Normal coronary arteries, normal wall motion, no sig. abnormalities   CARDIOVERSION N/A 08/04/2015   Procedure: CARDIOVERSION;  Surgeon: [name], MD;  Location: [place];  Service: Cardiovascular;  Laterality: N/A;   CARDIOVERSION N/A 10/28/2017   Procedure: CARDIOVERSION;  Surgeon: Larey Dresser, MD;  Location: [facility] ENDOSCOPY;  Service: Cardiovascular;  Laterality: N/A;   CARDIOVERSION N/A 04/12/2018   Procedure: CARDIOVERSION;  Surgeon: Thayer Headings, MD;  Location: [facility] ENDOSCOPY;  Service: Cardiovascular;  Laterality: N/A;   CARDIOVERSION N/A 07/26/2018   Procedure: CARDIOVERSION;  Surgeon: Larey Dresser, MD;  Location: [facility] ENDOSCOPY;  Service: Cardiovascular;  Laterality: N/A;   CARDIOVERSION N/A 10/09/2018   Procedure: CARDIOVERSION;  Surgeon: Sueanne Margarita, MD;  Location: [facility] ENDOSCOPY;  Service: Cardiovascular;  Laterality: N/A;   COLONOSCOPY  07-03-2002   FOREARM FRACTURE SURGERY  1986   FRACTURE SURGERY     GROIN DISSECTION Left 1992   CHAPEL [name] Right 1950s?   INTESTINAL BYPASS  1976   GASTRIC FOR OBESITY   JOINT REPLACEMENT     PATELLA FRACTURE SURGERY Left ~ 1995   "broke it twice; only had OR once"   PROSTATE BIOPSY  06/2014   RADIOACTIVE SEED IMPLANT N/A 10/17/2014   Procedure: RADIOACTIVE SEED IMPLANT    ;  Surgeon: Ailene Rud, MD;  Location: [facility];  Service: Urology;  Laterality: N/A;   68 SEEDS IMPLANTED    TEE WITHOUT CARDIOVERSION N/A 10/28/2017   Procedure: TRANSESOPHAGEAL ECHOCARDIOGRAM (TEE);  Surgeon:  Larey Dresser, MD;  Location: [facility] ENDOSCOPY;  Service: Cardiovascular;  Laterality: N/A;   TONSILLECTOMY  1950s   TOTAL KNEE ARTHROPLASTY Right 12/17/2014   Procedure: TOTAL KNEE ARTHROPLASTY;  Surgeon: Melrose Nakayama, MD;  Location: [place];  Service: Orthopedics;  Laterality: Right;   TRANSTHORACIC ECHOCARDIOGRAM  08-08-2003   moderate LVH/  ef 55-65%/  mild MR/  moderate LAE/  trivial TR/  trivial pericardial effusion posterior to the heart   VENTRAL HERNIA REPAIR  1977   VIDEO BRONCHOSCOPY Bilateral 09/21/2016   Procedure: VIDEO BRONCHOSCOPY WITHOUT FLUORO;  Surgeon: Collene Gobble, MD;  Location: [place];  Service: Cardiopulmonary;  Laterality: Bilateral;     Current Outpatient Medications  Medication Sig Dispense Refill   albuterol (PROVENTIL HFA;VENTOLIN HFA) 108 (90 Base) MCG/ACT inhaler Inhale 2 puffs into the lungs every 6 (six) hours as needed for wheezing or shortness of breath.     apixaban (ELIQUIS) 5 MG TABS tablet Take 1 tablet (5 mg total) by mouth 2 (two) times daily.     bumetanide (BUMEX) 2 MG tablet Take 0.5 tablets (1 mg total) by mouth daily.     buPROPion (WELLBUTRIN XL) 300 MG 24 hr tablet Take 300 mg by mouth daily.      cholecalciferol (VITAMIN D) 25 MCG (1000 UT) tablet Take 1,000 Units by mouth daily.     Coenzyme Q10 300 MG CAPS Take 300 mg by mouth at bedtime.     cyanocobalamin (,VITAMIN B-12,) 1000 MCG/ML injection Inject 1,000 mcg into the muscle every 30 (thirty) days.     diltiazem (CARDIZEM CD) 180 MG 24 hr capsule Take 180 mg by mouth daily.     escitalopram (LEXAPRO) 10 MG tablet Take 10 mg by mouth daily.     losartan (COZAAR) 25 MG tablet Take 1 tablet (25 mg total) by mouth daily. 90 tablet 3   magnesium oxide (MAG-OX) 400 MG tablet Take 400-800 mg by mouth See admin instructions. Take 2 tablets (800 mg) by mouth in the morning & take 1 tablet (400 mg) by mouth at night.     metolazone (ZAROXOLYN) 2.5 MG tablet Take 2.5 mg by mouth  daily as needed (for fluid retention).      metoprolol tartrate (LOPRESSOR) 50 MG tablet Take 1.5 tablets (75 mg total) by mouth 2 (two) times daily. 90 tablet 2   Multiple Vitamin (MULTIVITAMIN WITH MINERALS) TABS tablet Take 1 tablet by mouth daily.     Multiple Vitamins-Minerals (PRESERVISION AREDS 2 PO) Take 1 tablet by mouth 2 (two) times a day.     pantoprazole (PROTONIX) 40 MG tablet Take 1 tablet (40 mg total) by mouth daily. 45 tablet 0   potassium chloride SA (K-DUR,KLOR-CON) 20 MEQ tablet TAKE 2 TABLETS BY MOUTH DAILY. (Patient taking differently: Take 20 mEq by mouth 2 (two) times a day. ) 180 tablet 2   tamsulosin (FLOMAX) 0.4 MG CAPS capsule Take 0.8 mg by mouth at bedtime.      acetaminophen (TYLENOL) 500 MG tablet Take 1,000 mg by mouth every 6 (six) hours as needed for moderate pain or headache.     clotrimazole (LOTRIMIN) 1 % cream Apply 1 application topically 2 (two) times daily as needed (itching).     Polyethyl Glycol-Propyl Glycol (SYSTANE ULTRA) 0.4-0.3 % SOLN Place 1 drop into both eyes daily as needed (for dry eyes).     No current facility-administered medications for this encounter.     Allergies:   Ace inhibitors and Xarelto [rivaroxaban]   Social History:  The patient  reports that he has never smoked. He has never used smokeless tobacco. He reports that he does not drink alcohol or use drugs.   Family History:  The patient's  family history includes Atrial fibrillation in his father; Cancer in his sister; Liver disease in his mother; Renal Disease in his mother; Stroke in his mother.    ROS:  Please see the history of present illness.   All other systems are personally reviewed and negative.   Exam: Well appearing, alert and conversant, regular work of breathing,  good skin color  Recent Labs: 05/09/2018: Magnesium 1.5 11/01/2018: ALT 28; TSH 3.519 12/18/2018: BUN 22; Creatinine, Ser 1.00; Hemoglobin  11.3; Platelets 192; Potassium 5.1; Sodium 143    personally reviewed    Other studies personally reviewed: Epic records including Dr. Jackalyn Lombard last note  The patient presents wearable device technology, Apple watch, but no strips reviewed today  EKG- EKG reads afib but on closer review, he may be in SR with salvos of atrial runs   ASSESSMENT AND PLAN:  1.  Persistent atrial fibrillation/flutter S/p 2nd ablation No groin or swallowing issues Pt feels that he is in and out of afib  His systolic BP's are soft and I am hesitate to increase rate control because of this and being rate controlled at rest Continue CCB/BB without change in dose  Continue eliquis 5 mg bid   This patients CHA2DS2-VASc Score and unadjusted Ischemic Stroke Rate (% per year) is equal to 4.8 % stroke rate/year from a score of 4  Above score calculated as 1 point each if present [CHF, HTN, DM, Vascular=MI/PAD/Aortic Plaque, Age if 65-74, or Male] Above score calculated as 2 points each if present [Age > 75, or Stroke/TIA/TE]  2.Chronic diastilkic HF Pt is weighing daily and using metolazone prn to keep weight around 282 lbs( this am's weight) He feels he is up a few lbs and will adjust diuretic later this week  COVID screen The patient does not have any symptoms that suggest any further testing/ screening at this time.  Social distancing reinforced today.     Current medicines are reviewed at length with the patient today.   The patient does not have concerns regarding his medicines.  The following changes were made today:  none  Labs/ tests ordered today include: none Orders Placed This Encounter  Procedures   EKG 12-Lead    Patient Risk:  after full review of this patients clinical status, I feel that they are at high risk at this time.   F/u with Dr. Rayann Heman 8/10 as scheduled  Signed, Roderic Palau NP 01/22/2019 11:31 AM  [facility] [address] [place],  [address] [phone]

## 2019-02-26 ENCOUNTER — Encounter (HOSPITAL_COMMUNITY): Payer: Self-pay | Admitting: Nurse Practitioner

## 2019-02-26 ENCOUNTER — Inpatient Hospital Stay (HOSPITAL_BASED_OUTPATIENT_CLINIC_OR_DEPARTMENT_OTHER)
Admission: RE | Admit: 2019-02-26 | Discharge: 2019-02-26 | Disposition: A | Discharge status: Home / Self Care | Payer: Medicare Other | Source: Ambulatory Visit | Attending: Nurse Practitioner | Admitting: Nurse Practitioner

## 2019-02-26 DIAGNOSIS — I480 Paroxysmal atrial fibrillation: Secondary | ICD-10-CM

## 2019-02-26 NOTE — Progress Notes (Signed)
Electrophysiology TeleHealth Note   Due to national recommendations of social distancing due to [place] 19, Audio/video  telehealth visit is felt to be most appropriate for this patient at this time.  See MyChart message/consent below from today for patient consent regarding telehealth for the [facility].    Date:  02/26/2019   ID:  Eric Mayer, DOB Apr 08, 1941, MRN [id number]  Location: home  Provider location: [address] [place], [place] [address] Evaluation Performed:  Follow up   PCP:  [name], MD  Primary Cardiologist:   Dr. Rayann Heman Primary Electrophysiologist: Dr. Rayann Heman  CC: increased heart rate with exertion   History of Present Illness: Eric Mayer is a 78 y.o. male who presents via  video conferencing for a telehealth visit today.   The patient is referred for new consultation regarding  persistent HR elevations  with activity. HR's have been seen as high in the 160's to 180's.  His apple watch often reads unclassified or afib. He will forward some strips for review. He has chronic knee and back problems and a BMI of 131, all of which may be contributing to the higher heart rates with activity. When at rest or at night he is in [place]. He has recently moved to a retirement center that has a pool and other means to exercise. He is trying to participate  in some of these activities and lead a healthier lifestyle in order to promote weight loss. He is wanting to understand his options to try to control the exertional elevation of HR so he can be more active.   Today, he denies symptoms of palpitations, chest pain, shortness of breath, orthopnea, PND, lower extremity edema, claudication, dizziness, presyncope, syncope, bleeding, or neurologic sequela. The patient is tolerating medications without difficulties and is otherwise without complaint today.   he denies symptoms of cough, fevers, chills, or new SOB worrisome for COVID 19.     Atrial  Fibrillation Risk Factors:  he does have symptoms or diagnosis of sleep apnea. he is compliant with CPAP therapy. he does not have a history of rheumatic fever. he does not have a history of alcohol use. The patient does not have a history of early familial atrial fibrillation or other arrhythmias.  he has a BMI of There is no height or weight on file to calculate BMI.. There were no vitals filed for this visit.  Past Medical History:  Diagnosis Date  . Anemia 1980s X 1  . Arthritis    "hands, knees, hips, ankles" (07/31/2015)  . Atrial flutter ([place]) 07/31/2015  . Chronic diastolic heart failure ([place])    a. 11/2015: Echo w/ EF of 60-65%, no WMA, Grade 2 DD, trivial AR, ascending aorta mildly dilated.   . Depression   . History of cardiovascular stress test    a. 03/2015: Dobutamine Stress Echo with no evidence of ischemia.   [name] History of gastric bypass   . History of peptic ulcer    1980's  . History of radiation therapy 1993   sarcoma of left groin, tx at [facility]  . History of sarcoma    1993  LEFT GOIN--  S/P SURGERY, RADIATION AND CHEMO IN [place]  . Hypertension   . Hypogonadism in male   . Incomplete right bundle branch block   . 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 Nocturia   .  OSA on CPAP   . Persistent atrial fibrillation       . 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 PVC's (premature ventricular contractions) 11/21/2015  . Sarcoma ([place]) ~ 1993/1994   "of groin"  . Weakness of left leg    SECONDARY TO SURGICAL NERVE INJURY OF LEFT GOIN  . Wears glasses    Past Surgical History:  Procedure Laterality Date  . ATRIAL FIBRILLATION ABLATION N/A 06/08/2018   Procedure: ATRIAL FIBRILLATION ABLATION;  Surgeon: Thompson Grayer, MD;  Location: [place] [facility];  Service: Cardiovascular;  Laterality: N/A;  . ATRIAL FIBRILLATION ABLATION N/A  12/20/2018   Procedure: ATRIAL FIBRILLATION ABLATION;  Surgeon: Thompson Grayer, MD;  Location: [place] [facility];  Service: Cardiovascular;  Laterality: N/A;  . CARDIAC CATHETERIZATION  08-12-2003  dr Tressia Miners turner   Normal coronary arteries, normal wall motion, no sig. abnormalities  . CARDIOVERSION N/A 08/04/2015   Procedure: CARDIOVERSION;  Surgeon: [name], MD;  Location: [place];  Service: Cardiovascular;  Laterality: N/A;  . CARDIOVERSION N/A 10/28/2017   Procedure: CARDIOVERSION;  Surgeon: Larey Dresser, MD;  Location: [facility] ENDOSCOPY;  Service: Cardiovascular;  Laterality: N/A;  . CARDIOVERSION N/A 04/12/2018   Procedure: CARDIOVERSION;  Surgeon: Thayer Headings, MD;  Location: [facility] ENDOSCOPY;  Service: Cardiovascular;  Laterality: N/A;  . CARDIOVERSION N/A 07/26/2018   Procedure: CARDIOVERSION;  Surgeon: Larey Dresser, MD;  Location: [facility] ENDOSCOPY;  Service: Cardiovascular;  Laterality: N/A;  . CARDIOVERSION N/A 10/09/2018   Procedure: CARDIOVERSION;  Surgeon: Sueanne Margarita, MD;  Location: [facility] ENDOSCOPY;  Service: Cardiovascular;  Laterality: N/A;  . COLONOSCOPY  07-03-2002  . [place]  . FRACTURE SURGERY    . GROIN DISSECTION Left 1992   [place]   SARCOMA SURGERY  . INGUINAL HERNIA REPAIR Right 1950s?  . INTESTINAL BYPASS  1976   GASTRIC FOR OBESITY  . JOINT REPLACEMENT    . PATELLA FRACTURE SURGERY Left ~ 1995   "broke it twice; only had OR once"  . PROSTATE BIOPSY  06/2014  . RADIOACTIVE SEED IMPLANT N/A 10/17/2014   Procedure: RADIOACTIVE SEED IMPLANT    ;  Surgeon: Ailene Rud, MD;  Location: [facility];  Service: Urology;  Laterality: N/A;   68 SEEDS IMPLANTED   . TEE WITHOUT CARDIOVERSION N/A 10/28/2017   Procedure: TRANSESOPHAGEAL ECHOCARDIOGRAM (TEE);  Surgeon: Larey Dresser, MD;  Location: [facility] ENDOSCOPY;  Service: Cardiovascular;  Laterality: N/A;  . TONSILLECTOMY  1950s  . TOTAL KNEE ARTHROPLASTY Right  12/17/2014   Procedure: TOTAL KNEE ARTHROPLASTY;  Surgeon: Melrose Nakayama, MD;  Location: [place];  Service: Orthopedics;  Laterality: Right;  . TRANSTHORACIC ECHOCARDIOGRAM  08-08-2003   moderate LVH/  ef 55-65%/  mild MR/  moderate LAE/  trivial TR/  trivial pericardial effusion posterior to the heart  . [place]  . VIDEO BRONCHOSCOPY Bilateral 09/21/2016   Procedure: VIDEO BRONCHOSCOPY WITHOUT FLUORO;  Surgeon: Collene Gobble, MD;  Location: [place];  Service: Cardiopulmonary;  Laterality: Bilateral;     Current Outpatient Medications  Medication Sig Dispense Refill  . acetaminophen (TYLENOL) 500 MG tablet Take 1,000 mg by mouth every 6 (six) hours as needed for moderate pain or headache.    . albuterol (PROVENTIL HFA;VENTOLIN HFA) 108 (90 Base) MCG/ACT inhaler Inhale 2 puffs into the lungs every 6 (six) hours as needed for wheezing or shortness of breath.    [name]  apixaban (ELIQUIS) 5 MG TABS tablet Take 1 tablet (5 mg total) by mouth 2 (two) times daily.    . bumetanide (BUMEX) 2 MG tablet Take 0.5 tablets (1 mg total) by mouth daily.    [name] buPROPion (WELLBUTRIN XL) 300 MG 24 hr tablet Take 300 mg by mouth daily.     . cholecalciferol (VITAMIN D) 25 MCG (1000 UT) tablet Take 1,000 Units by mouth daily.    . clotrimazole (LOTRIMIN) 1 % cream Apply 1 application topically 2 (two) times daily as needed (itching).    . Coenzyme Q10 300 MG CAPS Take 300 mg by mouth at bedtime.    . cyanocobalamin (,VITAMIN B-12,) 1000 MCG/ML injection Inject 1,000 mcg into the muscle every 30 (thirty) days.    [name] diltiazem (CARDIZEM CD) 180 MG 24 hr capsule Take 180 mg by mouth daily.    [name] escitalopram (LEXAPRO) 10 MG tablet Take 10 mg by mouth daily.    [name] losartan (COZAAR) 25 MG tablet Take 1 tablet (25 mg total) by mouth daily. 90 tablet 3  . magnesium oxide (MAG-OX) 400 MG tablet Take 400-800 mg by mouth See admin instructions. Take 2 tablets (800 mg) by mouth in the morning & take 1 tablet  (400 mg) by mouth at night.    . metolazone (ZAROXOLYN) 2.5 MG tablet Take 2.5 mg by mouth daily as needed (for fluid retention).     . metoprolol tartrate (LOPRESSOR) 50 MG tablet Take 1.5 tablets (75 mg total) by mouth 2 (two) times daily. 90 tablet 2  . Multiple Vitamin (MULTIVITAMIN WITH MINERALS) TABS tablet Take 1 tablet by mouth daily.    . Multiple Vitamins-Minerals (PRESERVISION AREDS 2 PO) Take 1 tablet by mouth 2 (two) times a day.    . pantoprazole (PROTONIX) 40 MG tablet Take 1 tablet (40 mg total) by mouth daily. 45 tablet 0  . Polyethyl Glycol-Propyl Glycol (SYSTANE ULTRA) 0.4-0.3 % SOLN Place 1 drop into both eyes daily as needed (for dry eyes).    . potassium chloride SA (K-DUR,KLOR-CON) 20 MEQ tablet TAKE 2 TABLETS BY MOUTH DAILY. (Patient taking differently: Take 20 mEq by mouth 2 (two) times a day. ) 180 tablet 2  . tamsulosin (FLOMAX) 0.4 MG CAPS capsule Take 0.8 mg by mouth at bedtime.      No current facility-administered medications for this encounter.     Allergies:   Ace inhibitors and Xarelto [rivaroxaban]   Social History:  The patient  reports that he has never smoked. He has never used smokeless tobacco. He reports that he does not drink alcohol or use drugs.   Family History:  The patient's  family history includes Atrial fibrillation in his father; Cancer in his sister; Liver disease in his mother; Renal Disease in his mother; Stroke in his mother.    ROS:  Please see the history of present illness.   All other systems are personally reviewed and negative.   Exam: Well appearing, alert and conversant, regular work of breathing,  good skin color   Recent Labs: 05/09/2018: Magnesium 1.5 11/01/2018: ALT 28; TSH 3.519 12/18/2018: BUN 22; Creatinine, Ser 1.00; Hemoglobin 11.3; Platelets 192; Potassium 5.1; Sodium 143  personally reviewed    Other studies personally reviewed: Additional studies/ records that were reviewed today include:  Pt will send apple  tracings of elevated HR's    ASSESSMENT AND PLAN:  1.  Paroxysmal atrial fibrillation Pt with second ablation 2 months ago Still bothered with elevation of HR's with  exertion with watch reading unclassified or afib Will send reports to review He reports SR with rest Discussed use of tikosyn He will check into the price of dofetilide with insurance company and get back with me I will also discuss with Dr. Rayann Heman and then will further discuss with pt if he warts to go ahead with tikosyn admit States no benadryl use No misses doses of eliquis Continue same rate control as BP's have historically been soft    This patients CHA2DS2-VASc Score and unadjusted Ischemic Stroke Rate (% per year) is equal to 4.8 % stroke rate/year from a score of 4  Above score calculated as 1 point each if present [CHF, HTN, DM, Vascular=MI/PAD/Aortic Plaque, Age if 65-74, or Male] Above score calculated as 2 points each if present [Age > 75, or Stroke/TIA/TE]  COVID screen The patient does not have any symptoms that suggest any further testing/ screening at this time.  Social distancing reinforced today.   Follow-up: oper above discussion  Current medicines are reviewed at length with the patient today.   The patient does not have concerns regarding his medicines.  The following changes were made today:  none  Labs/ tests ordered today include: none No orders of the defined types were placed in this encounter.   Patient Risk:  after full review of this patients clinical status, I feel that they are at moderate  risk at this time.   Today, I have spent 25 minutes with the patient with telehealth technology discussing afib and tikosyn    Has f/u with Dr. Rayann Heman 8/4  Signed, Roderic Palau NP  02/26/2019 1:49 PM  Afib [facility] [address] [place], [place] [address] [phone]   I hereby voluntarily request, consent and authorize the [facility] and its  employed or contracted physicians, physician assistants, nurse practitioners or other licensed health care professionals (the Practitioner), to provide me with telemedicine health care services (the "Services") as deemed necessary by the treating Practitioner. I acknowledge and consent to receive the Services by the Practitioner via telemedicine. I understand that the telemedicine visit will involve communicating with the Practitioner through live audiovisual communication technology and the disclosure of certain medical information by electronic transmission. I acknowledge that I have been given the opportunity to request an in-person assessment or other available alternative prior to the telemedicine visit and am voluntarily participating in the telemedicine visit.   I understand that I have the right to withhold or withdraw my consent to the use of telemedicine in the course of my care at any time, without affecting my right to future care or treatment, and that the Practitioner or I may terminate the telemedicine visit at any time. I understand that I have the right to inspect all information obtained and/or recorded in the course of the telemedicine visit and may receive copies of available information for a reasonable fee.  I understand that some of the potential risks of receiving the Services via telemedicine include:   Delay or interruption in medical evaluation due to technological equipment failure or disruption;  Information transmitted may not be sufficient (e.g. poor resolution of images) to allow for appropriate medical decision making by the Practitioner; and/or  In rare instances, security protocols could fail, causing a breach of personal health information.   Furthermore, I acknowledge that it is my responsibility to provide information about my medical history, conditions and care that is complete and accurate to the best of my ability. I acknowledge  that Practitioner's advice,  recommendations, and/or decision may be based on factors not within their control, such as incomplete or inaccurate data provided by me or distortions of diagnostic images or specimens that may result from electronic transmissions. I understand that the practice of medicine is not an exact science and that Practitioner makes no warranties or guarantees regarding treatment outcomes. I acknowledge that I will receive a copy of this consent concurrently upon execution via email to the email address I last provided but may also request a printed copy by calling the office of the [facility].  I understand that my insurance will be billed for this visit.   I have read or had this consent read to me.  I understand the contents of this consent, which adequately explains the benefits and risks of the Services being provided via telemedicine.  I have been provided ample opportunity to ask questions regarding this consent and the Services and have had my questions answered to my satisfaction.  I give my informed consent for the services to be provided through the use of telemedicine in my medical care  By participating in this telemedicine visit I agree to the above.

## 2019-03-22 ENCOUNTER — Telehealth: Payer: Self-pay

## 2019-03-22 NOTE — Telephone Encounter (Signed)
Spoke with pt regarding appt on 03/26/19. Pt was advise to check vitals prior to appt. Pt stated he did not have any questions at this time.

## 2019-03-22 NOTE — Telephone Encounter (Signed)
Left message about appt on 03/26/19.

## 2019-03-26 ENCOUNTER — Encounter: Payer: Self-pay | Admitting: Internal Medicine

## 2019-03-26 ENCOUNTER — Telehealth (INDEPENDENT_AMBULATORY_CARE_PROVIDER_SITE_OTHER): Payer: Medicare Other | Admitting: Internal Medicine

## 2019-03-26 VITALS — BP 105/68 | HR 85 | Ht 70.0 in | Wt 282.2 lb

## 2019-03-26 DIAGNOSIS — I4819 Other persistent atrial fibrillation: Secondary | ICD-10-CM | POA: Diagnosis not present

## 2019-03-26 DIAGNOSIS — R0600 Dyspnea, unspecified: Secondary | ICD-10-CM

## 2019-03-26 DIAGNOSIS — I5032 Chronic diastolic (congestive) heart failure: Secondary | ICD-10-CM

## 2019-03-26 MED ORDER — FLECAINIDE ACETATE 50 MG PO TABS: 50.0000 mg | ORAL_TABLET | Freq: Two times a day (BID) | ORAL | 3 refills | Status: DC

## 2019-03-26 NOTE — Progress Notes (Signed)
Electrophysiology TeleHealth Note   Due to national recommendations of social distancing due to COVID 19, an audio/video telehealth visit is felt to be most appropriate for this patient at this time.  See MyChart message from today for the patient's consent to telehealth for [facility].    Date:  03/26/2019   ID:  Eric Mayer, DOB [birth year], MRN [id number]  Location: patient's home  Provider location:  [facility]  Evaluation Performed: Follow-up visit  PCP:  [name], MD   Electrophysiologist:  Dr Rayann Heman  Chief Complaint:  palpitations  History of Present Illness:    Eric Mayer is a 78 y.o. male who presents via telehealth conferencing today.  Since last being seen in our clinic, the patient reports doing reasonably well.  He has ongoing issues with frequent tachycardia and palpitations with minimal activity.  Today, he denies symptoms of chest pain, shortness of breath,  lower extremity edema, dizziness, presyncope, or syncope.  The patient is otherwise without complaint today.  The patient denies symptoms of fevers, chills, cough, or new SOB worrisome for COVID 19.  Past Medical History:  Diagnosis Date  . Anemia 1980s X 1  . Arthritis    "hands, knees, hips, ankles" (07/31/2015)  . Atrial flutter ([place]) 07/31/2015  . Chronic diastolic heart failure ([place])    a. 11/2015: Echo w/ EF of 60-65%, no WMA, Grade 2 DD, trivial AR, ascending aorta mildly dilated.   . Depression   . History of cardiovascular stress test    a. 03/2015: Dobutamine Stress Echo with no evidence of ischemia.   [name] History of gastric bypass   . History of peptic ulcer    1980's  . History of radiation therapy 1993   sarcoma of left groin, tx at [name]  . History of sarcoma    1993  LEFT GOIN--  S/P SURGERY, RADIATION AND CHEMO IN [place]  . Hypertension   . Hypogonadism in male   . Incomplete right bundle branch block   . Nerve injury    SURGICAL NERVE INJURY S/P  LEFT  GOIN REMOVAL SARCOMA 1992--  RESIDUAL WEAKNESS AND NUMBNESS UPPER LEFT LEG  . Nocturia   . OSA on CPAP   . Persistent atrial fibrillation       . Primary prostate adenocarcinoma ([place]) DX 07/08/14   Gleason 7,  stage T1c  . PVC's (premature ventricular contractions) 11/21/2015  . 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 Weakness of left leg    SECONDARY TO SURGICAL NERVE INJURY OF LEFT GOIN  . Wears glasses     Past Surgical History:  Procedure Laterality Date  . ATRIAL FIBRILLATION ABLATION N/A 06/08/2018   Procedure: ATRIAL FIBRILLATION ABLATION;  Surgeon: Thompson Grayer, MD;  Location: [place] CV LAB;  Service: Cardiovascular;  Laterality: N/A;  . ATRIAL FIBRILLATION ABLATION N/A 12/20/2018   Procedure: ATRIAL FIBRILLATION ABLATION;  Surgeon: Thompson Grayer, MD;  Location: [place] CV LAB;  Service: Cardiovascular;  Laterality: N/A;  . CARDIAC CATHETERIZATION  08-12-2003  dr Tressia Miners turner   Normal coronary arteries, normal wall motion, no sig. abnormalities  . CARDIOVERSION N/A 08/04/2015   Procedure: CARDIOVERSION;  Surgeon: [name], MD;  Location: [place];  Service: Cardiovascular;  Laterality: N/A;  . CARDIOVERSION N/A 10/28/2017   Procedure: CARDIOVERSION;  Surgeon: Larey Dresser, MD;  Location: [facility] ENDOSCOPY;  Service: Cardiovascular;  Laterality: N/A;  . CARDIOVERSION N/A 04/12/2018   Procedure: CARDIOVERSION;  Surgeon: Mertie Moores  J, MD;  Location: [place] ENDOSCOPY;  Service: Cardiovascular;  Laterality: N/A;  . CARDIOVERSION N/A 07/26/2018   Procedure: CARDIOVERSION;  Surgeon: Larey Dresser, MD;  Location: [facility] ENDOSCOPY;  Service: Cardiovascular;  Laterality: N/A;  . CARDIOVERSION N/A 10/09/2018   Procedure: CARDIOVERSION;  Surgeon: Sueanne Margarita, MD;  Location: [facility] ENDOSCOPY;  Service: Cardiovascular;  Laterality: N/A;  . COLONOSCOPY  07-03-2002  . [place]  . FRACTURE SURGERY    . GROIN DISSECTION Left 1992   [place]   SARCOMA  SURGERY  . INGUINAL HERNIA REPAIR Right 1950s?  . INTESTINAL BYPASS  1976   GASTRIC FOR OBESITY  . JOINT REPLACEMENT    . PATELLA FRACTURE SURGERY Left ~ 1995   "broke it twice; only had OR once"  . PROSTATE BIOPSY  06/2014  . RADIOACTIVE SEED IMPLANT N/A 10/17/2014   Procedure: RADIOACTIVE SEED IMPLANT    ;  Surgeon: Ailene Rud, MD;  Location: [facility];  Service: Urology;  Laterality: N/A;   68 SEEDS IMPLANTED   . TEE WITHOUT CARDIOVERSION N/A 10/28/2017   Procedure: TRANSESOPHAGEAL ECHOCARDIOGRAM (TEE);  Surgeon: Larey Dresser, MD;  Location: [facility] ENDOSCOPY;  Service: Cardiovascular;  Laterality: N/A;  . TONSILLECTOMY  1950s  . TOTAL KNEE ARTHROPLASTY Right 12/17/2014   Procedure: TOTAL KNEE ARTHROPLASTY;  Surgeon: Melrose Nakayama, MD;  Location: [place];  Service: Orthopedics;  Laterality: Right;  . TRANSTHORACIC ECHOCARDIOGRAM  08-08-2003   moderate LVH/  ef 55-65%/  mild MR/  moderate LAE/  trivial TR/  trivial pericardial effusion posterior to the heart  . [place]  . VIDEO BRONCHOSCOPY Bilateral 09/21/2016   Procedure: VIDEO BRONCHOSCOPY WITHOUT FLUORO;  Surgeon: Collene Gobble, MD;  Location: [place];  Service: Cardiopulmonary;  Laterality: Bilateral;    Current Outpatient Medications  Medication Sig Dispense Refill  . acetaminophen (TYLENOL) 500 MG tablet Take 1,000 mg by mouth every 6 (six) hours as needed for moderate pain or headache.    . albuterol (PROVENTIL HFA;VENTOLIN HFA) 108 (90 Base) MCG/ACT inhaler Inhale 2 puffs into the lungs every 6 (six) hours as needed for wheezing or shortness of breath.    [name] apixaban (ELIQUIS) 5 MG TABS tablet Take 1 tablet (5 mg total) by mouth 2 (two) times daily.    . bumetanide (BUMEX) 2 MG tablet Take 2 mg by mouth daily.    [name] buPROPion (WELLBUTRIN XL) 300 MG 24 hr tablet Take 300 mg by mouth daily.     . cholecalciferol (VITAMIN D) 25 MCG (1000 UT) tablet Take 1,000 Units by mouth daily.    .  clotrimazole (LOTRIMIN) 1 % cream Apply 1 application topically 2 (two) times daily as needed (itching).    . Coenzyme Q10 300 MG CAPS Take 300 mg by mouth at bedtime.    . cyanocobalamin (,VITAMIN B-12,) 1000 MCG/ML injection Inject 1,000 mcg into the muscle every 30 (thirty) days.    [name] diltiazem (CARDIZEM CD) 180 MG 24 hr capsule Take 180 mg by mouth daily.    [name] escitalopram (LEXAPRO) 10 MG tablet Take 10 mg by mouth daily.    [name] losartan (COZAAR) 25 MG tablet Take 1 tablet (25 mg total) by mouth daily. 90 tablet 3  . magnesium oxide (MAG-OX) 400 MG tablet Take 400-800 mg by mouth See admin instructions. Take 2 tablets (800 mg) by mouth in the morning & take 1 tablet (400 mg) by mouth at night.    [name]  metolazone (ZAROXOLYN) 2.5 MG tablet Take 2.5 mg by mouth daily as needed (for fluid retention).     . metoprolol tartrate (LOPRESSOR) 50 MG tablet Take 1.5 tablets (75 mg total) by mouth 2 (two) times daily. 90 tablet 2  . Multiple Vitamin (MULTIVITAMIN WITH MINERALS) TABS tablet Take 1 tablet by mouth daily.    . Multiple Vitamins-Minerals (PRESERVISION AREDS 2 PO) Take 1 tablet by mouth 2 (two) times a day.    [name] Glycol-Propyl Glycol (SYSTANE ULTRA) 0.4-0.3 % SOLN Place 1 drop into both eyes daily as needed (for dry eyes).    . potassium chloride SA (K-DUR,KLOR-CON) 20 MEQ tablet TAKE 2 TABLETS BY MOUTH DAILY. (Patient taking differently: Take 20 mEq by mouth 2 (two) times a day. ) 180 tablet 2  . tamsulosin (FLOMAX) 0.4 MG CAPS capsule Take 0.8 mg by mouth at bedtime.     . pantoprazole (PROTONIX) 40 MG tablet Take 1 tablet (40 mg total) by mouth daily. 45 tablet 0   No current facility-administered medications for this visit.     Allergies:   Ace inhibitors and Xarelto [rivaroxaban]   Social History:  The patient  reports that he has never smoked. He has never used smokeless tobacco. He reports that he does not drink alcohol or use drugs.   Family History:  The patient's family  history includes Atrial fibrillation in his father; Cancer in his sister; Liver disease in his mother; Renal Disease in his mother; Stroke in his mother.   ROS:  Please see the history of present illness.   All other systems are personally reviewed and negative.    Exam:    Vital Signs:  BP 105/68   Pulse 85   Ht 5\' 10"  (1.778 m)   Wt 282 lb 3.2 oz (128 kg)   BMI 40.49 kg/m   Well sounding and appearing, alert and conversant, regular work of breathing,  good skin color Eyes- anicteric, neuro- grossly intact, skin- no apparent rash or lesions or cyanosis, mouth- oral mucosa is pink  Labs/Other Tests and Data Reviewed:    Recent Labs: 05/09/2018: Magnesium 1.5 11/01/2018: ALT 28; TSH 3.519 12/18/2018: BUN 22; Creatinine, Ser 1.00; Hemoglobin 11.3; Platelets 192; Potassium 5.1; Sodium 143   Wt Readings from Last 3 Encounters:  03/26/19 282 lb 3.2 oz (128 kg)  01/22/19 289 lb 9.6 oz (131.4 kg)  12/20/18 288 lb (130.6 kg)     Apple watch tracings from today as well as 03/17/2019 are reviewed at length.  Today, he has sinus with frequent PACs/ nonsustained atach.   03/17/2019 reveals SVT, likely atypical atrial flutter   ASSESSMENT & PLAN:    1.  Afib/ atypical atrial flutter/ PACs/ nonsustained atach S/p ablation x 2 by me.  I do not think additional ablation would be beneficial currently. I would advise AAD therapy He has failed medical therapy with amidarone and stopped this in April. We discussed flecainide and tikosyn as options.  Risks and benefits to each were discussed.  He would prefer to try flecainide. I will therefore start flecainide 50mg  BID.  We will need to follow ekg closely given incomplete RBBB. He will therefore come to AF clinic in 48 hours for an ekg. Follow-up with AF clinic in 2 weeks  2. HTN Stable No change required today  3. Chronic diastolic dysfunction Stable No change required today  4. Obesity He is working on this  Follow-up:  ekg in 48 hours,  AF clinic in 2 weeks,  I will see again in 2-3 months   Patient Risk:  after full review of this patients clinical status, I feel that they are at moderate risk at this time.  Today, I have spent 15 minutes with the patient with telehealth technology discussing arrhythmia management .    Army Fossa, MD  03/26/2019 10:14 AM     [facility] [address] [place] [address] [phone] (office) [phone] (fax)

## 2019-03-26 NOTE — Patient Instructions (Addendum)
Medication Instructions:  Your physician recommends that you continue on your current medications as directed. Please refer to the Current Medication list given to you today.   Labwork: None   Testing/Procedures: None ordered.   Follow-Up: Your physician recommends that you schedule a follow-up appointment in:   10 weeks with Dr. Rayann Heman. Appt has been made for October 21 @ 10:45pm   Any Other Special Instructions Will Be Listed Below (If Applicable).     If you need a refill on your cardiac medications before your next appointment, please call your pharmacy.

## 2019-03-27 ENCOUNTER — Other Ambulatory Visit: Payer: Self-pay | Admitting: Internal Medicine

## 2019-03-27 ENCOUNTER — Ambulatory Visit
Admission: RE | Admit: 2019-03-27 | Discharge: 2019-03-27 | Disposition: A | Discharge status: Home / Self Care | Payer: Medicare Other | Source: Ambulatory Visit | Attending: Internal Medicine | Admitting: Internal Medicine

## 2019-03-27 DIAGNOSIS — M79652 Pain in left thigh: Secondary | ICD-10-CM

## 2019-03-29 ENCOUNTER — Other Ambulatory Visit: Payer: Self-pay

## 2019-03-29 ENCOUNTER — Ambulatory Visit (HOSPITAL_COMMUNITY)
Admission: RE | Admit: 2019-03-29 | Discharge: 2019-03-29 | Disposition: A | Discharge status: Home / Self Care | Payer: Medicare Other | Source: Ambulatory Visit | Attending: Physician Assistant | Admitting: Physician Assistant

## 2019-03-29 VITALS — BP 118/62 | HR 83

## 2019-03-29 DIAGNOSIS — I480 Paroxysmal atrial fibrillation: Secondary | ICD-10-CM | POA: Diagnosis not present

## 2019-03-29 NOTE — Progress Notes (Addendum)
Pt in for EKG since starting flecainide.  To be reviewed by Malka So, PA  ECG today shows SR HR 83, PVC, inc RBBB, LAD, QRS 104, QTc 460. Stable on flecainide. Will have patient follow up in AF clinic in two weeks. Follow up with Dr Rayann Heman as scheduled.

## 2019-04-03 ENCOUNTER — Other Ambulatory Visit: Payer: Self-pay | Admitting: Internal Medicine

## 2019-04-03 DIAGNOSIS — M79652 Pain in left thigh: Secondary | ICD-10-CM

## 2019-04-03 DIAGNOSIS — M25452 Effusion, left hip: Secondary | ICD-10-CM

## 2019-04-12 ENCOUNTER — Other Ambulatory Visit: Payer: Self-pay

## 2019-04-12 ENCOUNTER — Ambulatory Visit (HOSPITAL_COMMUNITY)
Admission: RE | Admit: 2019-04-12 | Discharge: 2019-04-12 | Disposition: A | Discharge status: Home / Self Care | Payer: Medicare Other | Source: Ambulatory Visit | Attending: Nurse Practitioner | Admitting: Nurse Practitioner

## 2019-04-12 ENCOUNTER — Encounter (HOSPITAL_COMMUNITY): Payer: Self-pay | Admitting: Nurse Practitioner

## 2019-04-12 VITALS — BP 110/64 | HR 80 | Ht 70.0 in | Wt 289.4 lb

## 2019-04-12 DIAGNOSIS — G4733 Obstructive sleep apnea (adult) (pediatric): Secondary | ICD-10-CM | POA: Diagnosis not present

## 2019-04-12 DIAGNOSIS — Z8546 Personal history of malignant neoplasm of prostate: Secondary | ICD-10-CM | POA: Diagnosis not present

## 2019-04-12 DIAGNOSIS — F329 Major depressive disorder, single episode, unspecified: Secondary | ICD-10-CM | POA: Insufficient documentation

## 2019-04-12 DIAGNOSIS — M1710 Unilateral primary osteoarthritis, unspecified knee: Secondary | ICD-10-CM | POA: Diagnosis not present

## 2019-04-12 DIAGNOSIS — I4819 Other persistent atrial fibrillation: Secondary | ICD-10-CM | POA: Diagnosis not present

## 2019-04-12 DIAGNOSIS — Z9884 Bariatric surgery status: Secondary | ICD-10-CM | POA: Diagnosis not present

## 2019-04-12 DIAGNOSIS — I4510 Unspecified right bundle-branch block: Secondary | ICD-10-CM | POA: Diagnosis not present

## 2019-04-12 DIAGNOSIS — Z8249 Family history of ischemic heart disease and other diseases of the circulatory system: Secondary | ICD-10-CM | POA: Insufficient documentation

## 2019-04-12 DIAGNOSIS — Z7901 Long term (current) use of anticoagulants: Secondary | ICD-10-CM | POA: Insufficient documentation

## 2019-04-12 DIAGNOSIS — Z923 Personal history of irradiation: Secondary | ICD-10-CM | POA: Diagnosis not present

## 2019-04-12 DIAGNOSIS — I4892 Unspecified atrial flutter: Secondary | ICD-10-CM | POA: Diagnosis not present

## 2019-04-12 DIAGNOSIS — Z79899 Other long term (current) drug therapy: Secondary | ICD-10-CM | POA: Insufficient documentation

## 2019-04-12 DIAGNOSIS — Z823 Family history of stroke: Secondary | ICD-10-CM | POA: Diagnosis not present

## 2019-04-12 DIAGNOSIS — Z96651 Presence of right artificial knee joint: Secondary | ICD-10-CM | POA: Insufficient documentation

## 2019-04-12 DIAGNOSIS — I480 Paroxysmal atrial fibrillation: Secondary | ICD-10-CM | POA: Diagnosis present

## 2019-04-12 DIAGNOSIS — I110 Hypertensive heart disease with heart failure: Secondary | ICD-10-CM | POA: Diagnosis not present

## 2019-04-12 DIAGNOSIS — Z809 Family history of malignant neoplasm, unspecified: Secondary | ICD-10-CM | POA: Diagnosis not present

## 2019-04-12 DIAGNOSIS — M1610 Unilateral primary osteoarthritis, unspecified hip: Secondary | ICD-10-CM | POA: Diagnosis not present

## 2019-04-12 DIAGNOSIS — M19049 Primary osteoarthritis, unspecified hand: Secondary | ICD-10-CM | POA: Diagnosis not present

## 2019-04-12 DIAGNOSIS — M19079 Primary osteoarthritis, unspecified ankle and foot: Secondary | ICD-10-CM | POA: Diagnosis not present

## 2019-04-12 DIAGNOSIS — I5032 Chronic diastolic (congestive) heart failure: Secondary | ICD-10-CM | POA: Diagnosis not present

## 2019-04-12 NOTE — Progress Notes (Signed)
Date:  04/12/2019   ID:  Eric Mayer, DOB 1941/05/12, MRN [id number]    Provider location: [address] [place], [place] [address] Evaluation Performed:  Follow up   PCP:  [name], MD  Primary Cardiologist:   Dr. Rayann Heman Primary Electrophysiologist: Dr. Rayann Heman  CC: afib/ increased heart rate with exertion   History of Present Illness: Eric Mayer is a 78 y.o. male who presents to  the afib clinic today.  The patient has h/o ablation x 2 but still has issues with  HR elevations with activity. HR's have been seen as high in the 160's to 180's.  His apple watch often reads unclassified or afib. He had been on amiodarone in the past but stopped for significant fatigue.  He has chronic knee and back problems and a BMI of 131, all of which may be contributing to the higher heart rates with activity. When at rest or at night he is in [place]. He has recently moved to a retirement center that has a pool and other means to exercise. He was seen by Dr. Rayann Heman for 3 month f/u and was placed on flecainide . He feels he may be some better with this although he still sees elevation of HR with activity.  Today, he denies symptoms of palpitations, chest pain, shortness of breath, orthopnea, PND, lower extremity edema, claudication, dizziness, presyncope, syncope, bleeding, or neurologic sequela. The patient is tolerating medications without difficulties and is otherwise without complaint today.   he denies symptoms of cough, fevers, chills, or new SOB worrisome for COVID 19.     Atrial Fibrillation Risk Factors:  he does have symptoms or diagnosis of sleep apnea. he is compliant with CPAP therapy. he does not have a history of rheumatic fever. he does not have a history of alcohol use. The patient does not have a history of early familial atrial fibrillation or other arrhythmias.  he has a BMI of Body mass index is 41.52 kg/m.[name] Filed Weights   04/12/19 0936  Weight: 131.3 kg     Past Medical History:  Diagnosis Date  . Anemia 1980s X 1  . Arthritis    "hands, knees, hips, ankles" (07/31/2015)  . Atrial flutter ([place]) 07/31/2015  . Chronic diastolic heart failure ([place])    a. 11/2015: Echo w/ EF of 60-65%, no WMA, Grade 2 DD, trivial AR, ascending aorta mildly dilated.   . Depression   . History of cardiovascular stress test    a. 03/2015: Dobutamine Stress Echo with no evidence of ischemia.   [name] History of gastric bypass   . History of peptic ulcer    1980's  . History of radiation therapy 1993   sarcoma of left groin, tx at [facility]  . History of sarcoma    1993  LEFT GOIN--  S/P SURGERY, RADIATION AND CHEMO IN [place]  . Hypertension   . Hypogonadism in male   . Incomplete right bundle branch block   . 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 Nocturia   . OSA on CPAP   . Persistent atrial fibrillation       . Primary prostate adenocarcinoma ([place]) DX 07/08/14   Gleason 7,  stage T1c  . PVC's (premature ventricular contractions) 11/21/2015  . 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 Weakness of left leg    SECONDARY  TO SURGICAL NERVE INJURY OF LEFT GOIN  . Wears glasses    Past Surgical History:  Procedure Laterality Date  . ATRIAL FIBRILLATION ABLATION N/A 06/08/2018   Procedure: ATRIAL FIBRILLATION ABLATION;  Surgeon: Thompson Grayer, MD;  Location: [place] CV LAB;  Service: Cardiovascular;  Laterality: N/A;  . ATRIAL FIBRILLATION ABLATION N/A 12/20/2018   Procedure: ATRIAL FIBRILLATION ABLATION;  Surgeon: Thompson Grayer, MD;  Location: [place] CV LAB;  Service: Cardiovascular;  Laterality: N/A;  . CARDIAC CATHETERIZATION  08-12-2003  dr Tressia Miners turner   Normal coronary arteries, normal wall motion, no sig. abnormalities  . CARDIOVERSION N/A 08/04/2015   Procedure: CARDIOVERSION;  Surgeon: [name], MD;  Location: [place];  Service: Cardiovascular;   Laterality: N/A;  . CARDIOVERSION N/A 10/28/2017   Procedure: CARDIOVERSION;  Surgeon: Larey Dresser, MD;  Location: [facility] ENDOSCOPY;  Service: Cardiovascular;  Laterality: N/A;  . CARDIOVERSION N/A 04/12/2018   Procedure: CARDIOVERSION;  Surgeon: Thayer Headings, MD;  Location: [facility] ENDOSCOPY;  Service: Cardiovascular;  Laterality: N/A;  . CARDIOVERSION N/A 07/26/2018   Procedure: CARDIOVERSION;  Surgeon: Larey Dresser, MD;  Location: [facility] ENDOSCOPY;  Service: Cardiovascular;  Laterality: N/A;  . CARDIOVERSION N/A 10/09/2018   Procedure: CARDIOVERSION;  Surgeon: Sueanne Margarita, MD;  Location: [facility] ENDOSCOPY;  Service: Cardiovascular;  Laterality: N/A;  . COLONOSCOPY  07-03-2002  . [place]  . FRACTURE SURGERY    . GROIN DISSECTION Left 1992   [place]   SARCOMA SURGERY  . INGUINAL HERNIA REPAIR Right 1950s?  . INTESTINAL BYPASS  1976   GASTRIC FOR OBESITY  . JOINT REPLACEMENT    . PATELLA FRACTURE SURGERY Left ~ 1995   "broke it twice; only had OR once"  . PROSTATE BIOPSY  06/2014  . RADIOACTIVE SEED IMPLANT N/A 10/17/2014   Procedure: RADIOACTIVE SEED IMPLANT    ;  Surgeon: Ailene Rud, MD;  Location: [facility];  Service: Urology;  Laterality: N/A;   68 SEEDS IMPLANTED   . TEE WITHOUT CARDIOVERSION N/A 10/28/2017   Procedure: TRANSESOPHAGEAL ECHOCARDIOGRAM (TEE);  Surgeon: Larey Dresser, MD;  Location: [facility] ENDOSCOPY;  Service: Cardiovascular;  Laterality: N/A;  . TONSILLECTOMY  1950s  . TOTAL KNEE ARTHROPLASTY Right 12/17/2014   Procedure: TOTAL KNEE ARTHROPLASTY;  Surgeon: Melrose Nakayama, MD;  Location: [place];  Service: Orthopedics;  Laterality: Right;  . TRANSTHORACIC ECHOCARDIOGRAM  08-08-2003   moderate LVH/  ef 55-65%/  mild MR/  moderate LAE/  trivial TR/  trivial pericardial effusion posterior to the heart  . [place]  . VIDEO BRONCHOSCOPY Bilateral 09/21/2016   Procedure: VIDEO BRONCHOSCOPY WITHOUT FLUORO;   Surgeon: Collene Gobble, MD;  Location: [place];  Service: Cardiopulmonary;  Laterality: Bilateral;     Current Outpatient Medications  Medication Sig Dispense Refill  . acetaminophen (TYLENOL) 500 MG tablet Take 1,000 mg by mouth every 6 (six) hours as needed for moderate pain or headache.    . albuterol (PROVENTIL HFA;VENTOLIN HFA) 108 (90 Base) MCG/ACT inhaler Inhale 2 puffs into the lungs every 6 (six) hours as needed for wheezing or shortness of breath.    [name] apixaban (ELIQUIS) 5 MG TABS tablet Take 1 tablet (5 mg total) by mouth 2 (two) times daily.    . bumetanide (BUMEX) 2 MG tablet Take 2 mg by mouth daily.    [name] buPROPion (WELLBUTRIN XL) 300 MG 24 hr tablet Take 300 mg by mouth daily.     [name]  cholecalciferol (VITAMIN D) 25 MCG (1000 UT) tablet Take 1,000 Units by mouth daily.    . clotrimazole (LOTRIMIN) 1 % cream Apply 1 application topically 2 (two) times daily as needed (itching).    . Coenzyme Q10 300 MG CAPS Take 300 mg by mouth at bedtime.    . cyanocobalamin (,VITAMIN B-12,) 1000 MCG/ML injection Inject 1,000 mcg into the muscle every 30 (thirty) days.    [name] diltiazem (CARDIZEM CD) 180 MG 24 hr capsule Take 180 mg by mouth daily.    [name] escitalopram (LEXAPRO) 10 MG tablet Take 10 mg by mouth daily.    . flecainide (TAMBOCOR) 50 MG tablet Take 1 tablet (50 mg total) by mouth 2 (two) times daily. 180 tablet 3  . losartan (COZAAR) 25 MG tablet Take 1 tablet (25 mg total) by mouth daily. 90 tablet 3  . magnesium oxide (MAG-OX) 400 MG tablet Take 400-800 mg by mouth See admin instructions. Take 2 tablets (800 mg) by mouth in the morning & take 1 tablet (400 mg) by mouth at night.    . metoprolol tartrate (LOPRESSOR) 50 MG tablet Take 1.5 tablets (75 mg total) by mouth 2 (two) times daily. 90 tablet 2  . Multiple Vitamin (MULTIVITAMIN WITH MINERALS) TABS tablet Take 1 tablet by mouth daily.    . Multiple Vitamins-Minerals (PRESERVISION AREDS 2 PO) Take 1 tablet by mouth 2 (two) times a  day.    [name] Glycol-Propyl Glycol (SYSTANE ULTRA) 0.4-0.3 % SOLN Place 1 drop into both eyes daily as needed (for dry eyes).    . potassium chloride SA (K-DUR,KLOR-CON) 20 MEQ tablet TAKE 2 TABLETS BY MOUTH DAILY. (Patient taking differently: Take 20 mEq by mouth 2 (two) times a day. ) 180 tablet 2  . tamsulosin (FLOMAX) 0.4 MG CAPS capsule Take 0.8 mg by mouth at bedtime.     . metolazone (ZAROXOLYN) 2.5 MG tablet Take 2.5 mg by mouth daily as needed (for fluid retention).     . pantoprazole (PROTONIX) 40 MG tablet Take 1 tablet (40 mg total) by mouth daily. 45 tablet 0   No current facility-administered medications for this encounter.     Allergies:   Ace inhibitors and Xarelto [rivaroxaban]   Social History:  The patient  reports that he has never smoked. He has never used smokeless tobacco. He reports that he does not drink alcohol or use drugs.   Family History:  The patient's  family history includes Atrial fibrillation in his father; Cancer in his sister; Liver disease in his mother; Renal Disease in his mother; Stroke in his mother.    ROS:  Please see the history of present illness.   All other systems are personally reviewed and negative.   Exam: Well appearing, alert and conversant, regular work of breathing,  good skin color   Recent Labs: 05/09/2018: Magnesium 1.5 11/01/2018: ALT 28; TSH 3.519 12/18/2018: BUN 22; Creatinine, Ser 1.00; Hemoglobin 11.3; Platelets 192; Potassium 5.1; Sodium 143  personally reviewed   EKG- NSR at 80 bpm, PR int 150 ms, qrs int 106 ms, qtc 438 ms IRBBB   ASSESSMENT AND PLAN:  1.  Paroxysmal atrial fibrillation Pt with second ablation 3+  months ago Still bothered with elevation of HR's with exertion with watch reading unclassified or afib Started on flecainide 50 mg bid with some improvement  He reports SR with rest, in SR today Continue eliquis 5 mg bid Continue CCB/BB at current dose    This patients CHA2DS2-VASc Score and  unadjusted Ischemic Stroke Rate (% per year) is equal to 4.8 % stroke rate/year from a score of 4  Above score calculated as 1 point each if present [CHF, HTN, DM, Vascular=MI/PAD/Aortic Plaque, Age if 65-74, or Male] Above score calculated as 2 points each if present [Age > 75, or Stroke/TIA/TE]  COVID screen The patient does not have any symptoms that suggest any further testing/ screening at this time.  Social distancing reinforced today.   Follow-up:  Dr. Rayann Heman 06/06/19 as scheduled  Current medicines are reviewed at length with the patient today.   The patient does not have concerns regarding his medicines.  The following changes were made today:  none  Labs/ tests ordered today include: none No orders of the defined types were placed in this encounter.   Patient Risk:  after full review of this patients clinical status, I feel that they are at moderate  risk at this time.   Signed, Roderic Palau NP  04/12/2019 10:01 AM  Afib [facility] [address] [place], [place] [address] [phone]

## 2019-04-23 ENCOUNTER — Telehealth: Payer: Self-pay | Admitting: Cardiovascular Disease

## 2019-04-25 NOTE — Telephone Encounter (Signed)
Called patient to clarify how he is taking. He is taking magnesium 400 mg - 2 tablets in the morning and 1 tablet in the evening.  Patient's last few OV to not reflect this. Routing to Dr Oval Linsey to clarify before we send in a refill.

## 2019-04-25 NOTE — Telephone Encounter (Signed)
Please advise if OK to refill. Listed under Historical Provider. 

## 2019-04-29 ENCOUNTER — Ambulatory Visit
Admission: RE | Admit: 2019-04-29 | Discharge: 2019-04-29 | Disposition: A | Discharge status: Home / Self Care | Payer: Medicare Other | Source: Ambulatory Visit | Attending: Internal Medicine | Admitting: Internal Medicine

## 2019-04-29 ENCOUNTER — Other Ambulatory Visit: Payer: Self-pay

## 2019-04-29 DIAGNOSIS — M25452 Effusion, left hip: Secondary | ICD-10-CM

## 2019-04-29 DIAGNOSIS — M79652 Pain in left thigh: Secondary | ICD-10-CM

## 2019-04-29 MED ORDER — GADOBENATE DIMEGLUMINE 529 MG/ML IV SOLN
20.0000 mL | Freq: Once | INTRAVENOUS | Status: AC | PRN
  Administered 2019-04-29: 20 mL via INTRAVENOUS

## 2019-05-02 ENCOUNTER — Other Ambulatory Visit: Payer: Medicare Other

## 2019-05-21 ENCOUNTER — Other Ambulatory Visit: Payer: Self-pay | Admitting: Cardiovascular Disease

## 2019-06-06 ENCOUNTER — Telehealth (INDEPENDENT_AMBULATORY_CARE_PROVIDER_SITE_OTHER): Payer: Medicare Other | Admitting: Internal Medicine

## 2019-06-06 ENCOUNTER — Encounter: Payer: Self-pay | Admitting: Internal Medicine

## 2019-06-06 VITALS — BP 122/62 | HR 91 | Ht 70.0 in | Wt 281.0 lb

## 2019-06-06 DIAGNOSIS — I5032 Chronic diastolic (congestive) heart failure: Secondary | ICD-10-CM

## 2019-06-06 DIAGNOSIS — I10 Essential (primary) hypertension: Secondary | ICD-10-CM

## 2019-06-06 DIAGNOSIS — D6869 Other thrombophilia: Secondary | ICD-10-CM

## 2019-06-06 DIAGNOSIS — I4819 Other persistent atrial fibrillation: Secondary | ICD-10-CM

## 2019-06-06 NOTE — Progress Notes (Signed)
Electrophysiology TeleHealth Note   Due to national recommendations of social distancing due to COVID 19, an audio/video telehealth visit is felt to be most appropriate for this patient at this time.  See MyChart message from today for the patient's consent to telehealth for [facility].  Date:  06/06/2019   ID:  Eric Mayer, DOB 24-Mar-1941, MRN [id number]  Location: patient's home  Provider location:  [facility]  Evaluation Performed: Follow-up visit  PCP:  [name], MD   Electrophysiologist:  Dr Rayann Heman  Chief Complaint:  AF follow up  History of Present Illness:    Eric Mayer is a 78 y.o. male who presents via telehealth conferencing today.  Since last being seen in our clinic, the patient reports doing very well.  Today, he denies symptoms of palpitations, chest pain, shortness of breath,  lower extremity edema, dizziness, presyncope, or syncope.  The patient is otherwise without complaint today.  The patient denies symptoms of fevers, chills, cough, or new SOB worrisome for COVID 19.  Past Medical History:  Diagnosis Date  . Anemia 1980s X 1  . Arthritis    "hands, knees, hips, ankles" (07/31/2015)  . Atrial flutter ([place]) 07/31/2015  . Chronic diastolic heart failure ([place])    a. 11/2015: Echo w/ EF of 60-65%, no WMA, Grade 2 DD, trivial AR, ascending aorta mildly dilated.   . Depression   . History of cardiovascular stress test    a. 03/2015: Dobutamine Stress Echo with no evidence of ischemia.   [name] History of gastric bypass   . History of peptic ulcer    1980's  . History of radiation therapy 1993   sarcoma of left groin, tx at [facility]  . History of sarcoma    1993  LEFT GOIN--  S/P SURGERY, RADIATION AND CHEMO IN [place]  . Hypertension   . Hypogonadism in male   . Incomplete right bundle branch block   . 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  Nocturia   . OSA on CPAP   . Persistent atrial fibrillation ([place])       . Primary prostate adenocarcinoma ([place]) DX 07/08/14   Gleason 7,  stage T1c  . PVC's (premature ventricular contractions) 11/21/2015  . 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 Weakness of left leg    SECONDARY TO SURGICAL NERVE INJURY OF LEFT GOIN  . Wears glasses     Past Surgical History:  Procedure Laterality Date  . ATRIAL FIBRILLATION ABLATION N/A 06/08/2018   Procedure: ATRIAL FIBRILLATION ABLATION;  Surgeon: Thompson Grayer, MD;  Location: [place] CV [facility];  Service: Cardiovascular;  Laterality: N/A;  . ATRIAL FIBRILLATION ABLATION N/A 12/20/2018   Procedure: ATRIAL FIBRILLATION ABLATION;  Surgeon: Thompson Grayer, MD;  Location: [place] [facility];  Service: Cardiovascular;  Laterality: N/A;  . CARDIAC CATHETERIZATION  08-12-2003  dr Tressia Miners turner   Normal coronary arteries, normal wall motion, no sig. abnormalities  . CARDIOVERSION N/A 08/04/2015   Procedure: CARDIOVERSION;  Surgeon: [name], MD;  Location: [place];  Service: Cardiovascular;  Laterality: N/A;  . CARDIOVERSION N/A 10/28/2017   Procedure: CARDIOVERSION;  Surgeon: Larey Dresser, MD;  Location: [facility] ENDOSCOPY;  Service: Cardiovascular;  Laterality: N/A;  . CARDIOVERSION N/A 04/12/2018   Procedure: CARDIOVERSION;  Surgeon: Thayer Headings, MD;  Location: [employer];  Service: Cardiovascular;  Laterality:  N/A;  . CARDIOVERSION N/A 07/26/2018   Procedure: CARDIOVERSION;  Surgeon: Larey Dresser, MD;  Location: [facility] ENDOSCOPY;  Service: Cardiovascular;  Laterality: N/A;  . CARDIOVERSION N/A 10/09/2018   Procedure: CARDIOVERSION;  Surgeon: Sueanne Margarita, MD;  Location: [facility] ENDOSCOPY;  Service: Cardiovascular;  Laterality: N/A;  . COLONOSCOPY  07-03-2002  . [place]  . FRACTURE SURGERY    . GROIN DISSECTION Left 1992   [place]   SARCOMA SURGERY  . INGUINAL HERNIA REPAIR Right 1950s?  . INTESTINAL BYPASS   1976   GASTRIC FOR OBESITY  . JOINT REPLACEMENT    . PATELLA FRACTURE SURGERY Left ~ 1995   "broke it twice; only had OR once"  . PROSTATE BIOPSY  06/2014  . RADIOACTIVE SEED IMPLANT N/A 10/17/2014   Procedure: RADIOACTIVE SEED IMPLANT    ;  Surgeon: Ailene Rud, MD;  Location: [facility];  Service: Urology;  Laterality: N/A;   68 SEEDS IMPLANTED   . TEE WITHOUT CARDIOVERSION N/A 10/28/2017   Procedure: TRANSESOPHAGEAL ECHOCARDIOGRAM (TEE);  Surgeon: Larey Dresser, MD;  Location: [facility] ENDOSCOPY;  Service: Cardiovascular;  Laterality: N/A;  . TONSILLECTOMY  1950s  . TOTAL KNEE ARTHROPLASTY Right 12/17/2014   Procedure: TOTAL KNEE ARTHROPLASTY;  Surgeon: Melrose Nakayama, MD;  Location: [place];  Service: Orthopedics;  Laterality: Right;  . TRANSTHORACIC ECHOCARDIOGRAM  08-08-2003   moderate LVH/  ef 55-65%/  mild MR/  moderate LAE/  trivial TR/  trivial pericardial effusion posterior to the heart  . [place]  . VIDEO BRONCHOSCOPY Bilateral 09/21/2016   Procedure: VIDEO BRONCHOSCOPY WITHOUT FLUORO;  Surgeon: Collene Gobble, MD;  Location: [place];  Service: Cardiopulmonary;  Laterality: Bilateral;    Current Outpatient Medications  Medication Sig Dispense Refill  . acetaminophen (TYLENOL) 500 MG tablet Take 1,000 mg by mouth every 6 (six) hours as needed for moderate pain or headache.    . albuterol (PROVENTIL HFA;VENTOLIN HFA) 108 (90 Base) MCG/ACT inhaler Inhale 2 puffs into the lungs every 6 (six) hours as needed for wheezing or shortness of breath.    [name] apixaban (ELIQUIS) 5 MG TABS tablet Take 1 tablet (5 mg total) by mouth 2 (two) times daily.    . bumetanide (BUMEX) 2 MG tablet Take 2 mg by mouth daily.    [name] buPROPion (WELLBUTRIN XL) 300 MG 24 hr tablet Take 300 mg by mouth daily.     . cholecalciferol (VITAMIN D) 25 MCG (1000 UT) tablet Take 1,000 Units by mouth 2 (two) times daily.     . clotrimazole (LOTRIMIN) 1 % cream Apply 1 application  topically 2 (two) times daily as needed (itching).    . Coenzyme Q10 300 MG CAPS Take 300 mg by mouth at bedtime.    . cyanocobalamin (,VITAMIN B-12,) 1000 MCG/ML injection Inject 1,000 mcg into the muscle every 30 (thirty) days.    [name] diltiazem (CARDIZEM CD) 180 MG 24 hr capsule Take 180 mg by mouth daily.    [name] escitalopram (LEXAPRO) 10 MG tablet Take 10 mg by mouth daily.    . flecainide (TAMBOCOR) 50 MG tablet Take 1 tablet (50 mg total) by mouth 2 (two) times daily. 180 tablet 3  . losartan (COZAAR) 25 MG tablet TAKE ONE (1) TABLET BY MOUTH EACH DAY 90 tablet 1  . magnesium oxide (MAG-OX) 400 MG tablet TAKE 2 TABLETS EACH MORNING AND 1 TABLETEACH EVENING. 270 tablet 0  . metoprolol tartrate (LOPRESSOR)  25 MG tablet Take 25 mg by mouth 2 (two) times daily.    . metoprolol tartrate (LOPRESSOR) 50 MG tablet Take 50 mg by mouth 2 (two) times daily.    . Multiple Vitamin (MULTIVITAMIN WITH MINERALS) TABS tablet Take 1 tablet by mouth daily.    . Multiple Vitamins-Minerals (PRESERVISION AREDS 2 PO) Take 1 tablet by mouth 2 (two) times a day.    [name] Glycol-Propyl Glycol (SYSTANE ULTRA) 0.4-0.3 % SOLN Place 1 drop into both eyes daily as needed (for dry eyes).    . potassium chloride SA (KLOR-CON) 20 MEQ tablet Take 20 mEq by mouth 2 (two) times daily.    . tamsulosin (FLOMAX) 0.4 MG CAPS capsule Take 0.8 mg by mouth at bedtime.      No current facility-administered medications for this visit.     Allergies:   Ace inhibitors and Xarelto [rivaroxaban]   Social History:  The patient  reports that he has never smoked. He has never used smokeless tobacco. He reports that he does not drink alcohol or use drugs.   Family History:  The patient's  family history includes Atrial fibrillation in his father; Cancer in his sister; Liver disease in his mother; Renal Disease in his mother; Stroke in his mother.   ROS:  Please see the history of present illness.   All other systems are personally  reviewed and negative.    Exam:    Vital Signs:  BP 122/62   Pulse 91   Ht 5\' 10"  (1.778 m)   Wt 281 lb (127.5 kg)   BMI 40.32 kg/m   Well sounding and appearing, alert and conversant, regular work of breathing,  good skin color Eyes- anicteric, neuro- grossly intact, skin- no apparent rash or lesions or cyanosis, mouth- oral mucosa is pink  Labs/Other Tests and Data Reviewed:    Recent Labs: 11/01/2018: ALT 28; TSH 3.519 12/18/2018: BUN 22; Creatinine, Ser 1.00; Hemoglobin 11.3; Platelets 192; Potassium 5.1; Sodium 143   Wt Readings from Last 3 Encounters:  06/06/19 281 lb (127.5 kg)  04/12/19 289 lb 6.4 oz (131.3 kg)  03/26/19 282 lb 3.2 oz (128 kg)      ASSESSMENT & PLAN:    1.  Persistent atrial fibrillation/atypical atrial flutter/PAC's/AT For last 3 weeks, has been significantly improved with no arrhythmias Continue Flecainide Continue Eliquis for CHADS2VASC of 3  2.  HTN Stable No change required today  3.  Chronic diastolic heart failure He has been eating more sodium at [place] low sodium diet  4.  Obesity He continues to work on weight loss   Follow-up:  6 months with me with virtual visit    Patient Risk:  after full review of this patients clinical status, I feel that they are at moderate risk at this time.  Today, I have spent 15 minutes with the patient with telehealth technology discussing arrhythmia management .    Army Fossa, MD  06/06/2019 10:46 AM     [facility] [address] [place] [address] [phone] (office) [phone] (fax)

## 2019-06-18 ENCOUNTER — Other Ambulatory Visit: Payer: Self-pay

## 2019-06-18 ENCOUNTER — Other Ambulatory Visit: Payer: Self-pay | Admitting: Cardiovascular Disease

## 2019-06-22 ENCOUNTER — Other Ambulatory Visit: Payer: Self-pay

## 2019-06-22 MED ORDER — METOPROLOL TARTRATE 25 MG PO TABS: 25.0000 mg | ORAL_TABLET | Freq: Two times a day (BID) | ORAL | 2 refills | Status: DC

## 2019-06-27 IMAGING — DX CHEST - 2 VIEW
2 series · 2 of 2 positions shown · non-contrast
Comparison: 09/25/2018, 09/16/2016, CT chest 06/05/2018

CLINICAL DATA: 77-year-old male with a history shortness of breath
for 2 weeks

EXAM:
CHEST - 2 VIEW

[w chest pa]
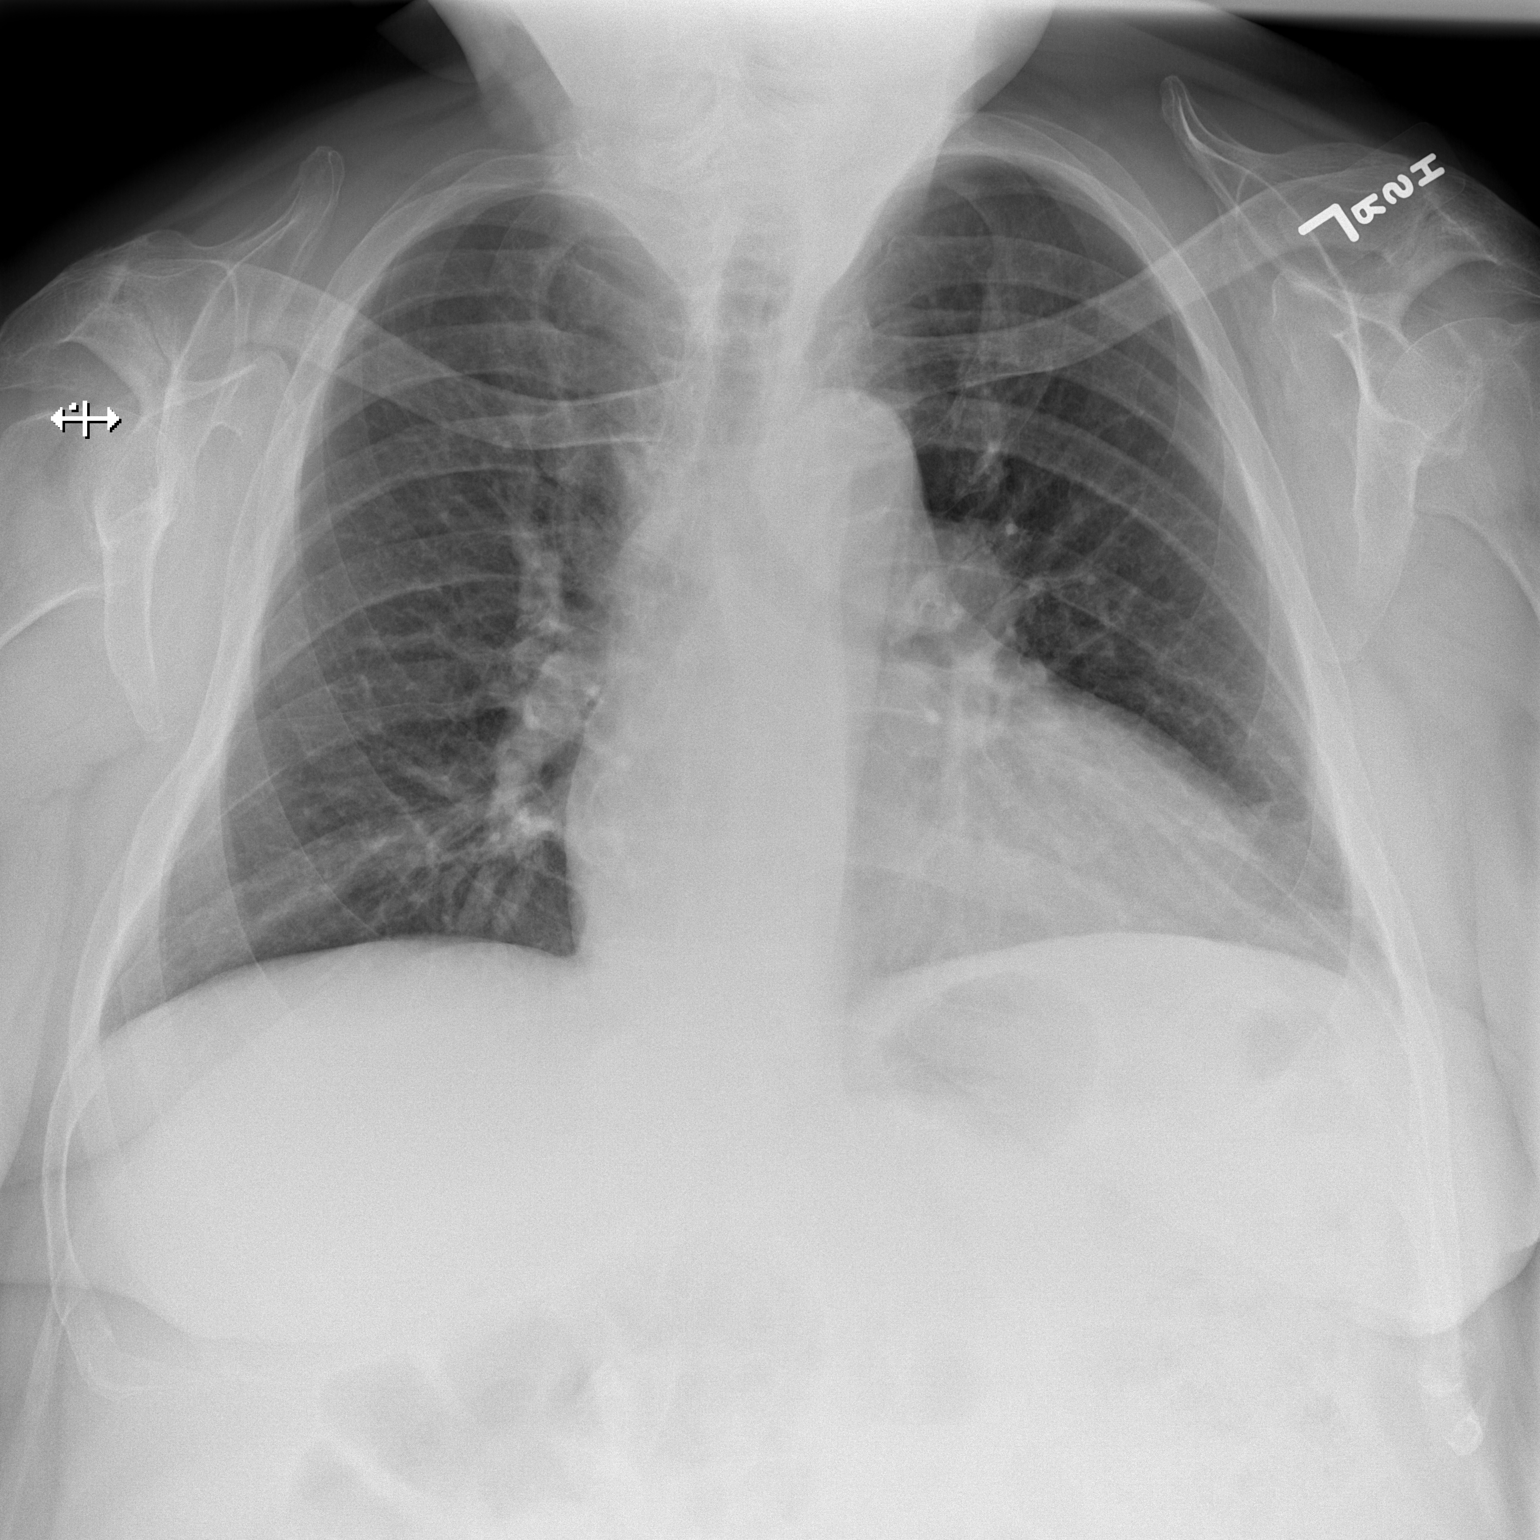

[w chest lat]
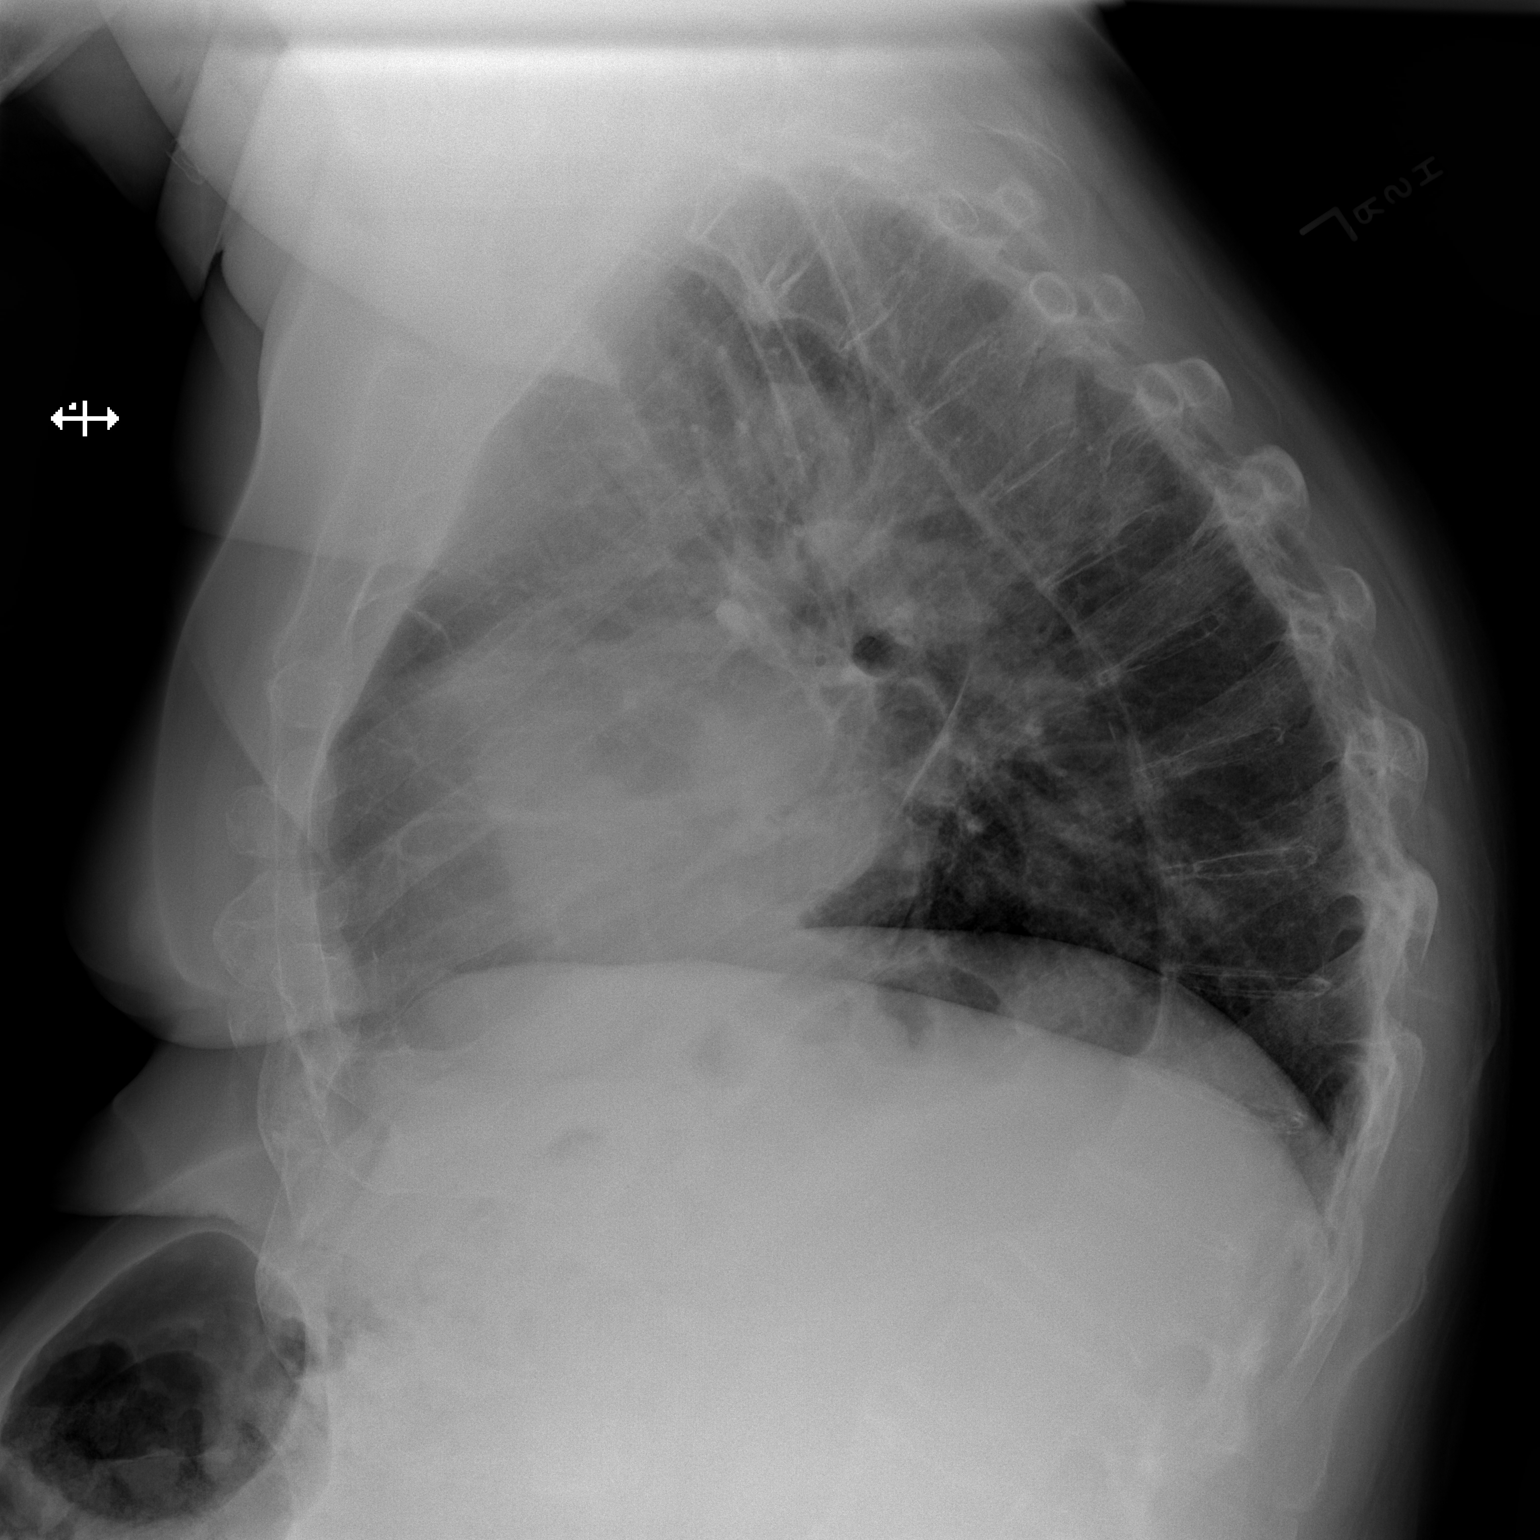

[2 of 2 positions shown; findings below may reference images not displayed]

FINDINGS: Cardiomediastinal silhouette unchanged. No evidence of central
vascular congestion. No interlobular septal thickening.

No pleural effusion or pneumothorax. Similar appearance of
accentuated kyphotic curvature with multilevel degenerative changes
and flowing enthesophytes.

No confluent airspace disease.  No displaced fracture
IMPRESSION: Negative for acute cardiopulmonary disease

## 2019-07-25 NOTE — Telephone Encounter (Signed)
I spoke with pt. He reports episodes of elevated heart rate and shortness of breath with exertion since 12/6-12/7.  This is very similar to what he had in the past. Prior to 12/6 he had been feeling good. Current episodes are not more severe than he had in the past.  He has sent several tracings from his watch for review, He does not want to come in to afib clinic at this time due to shortness of breath and fatigue, He reports current weight is 285 lbs.  He is staying hydrated. BP was 98/74 this AM.  He is asking what to do about medications when BP is low.  I told him he could lower dose of lopressor to 50 mg tonight if BP is low and heart rate OK.  If BP low and heart rate elevated I advised him to call on call provider for advice.  If BP low tomorrow morning I told him to hold Losartan.   I advised him to go to ED if any of his symptoms worsen.  Will forward to Dr Rayann Heman for review/recommendations

## 2019-07-26 ENCOUNTER — Telehealth (INDEPENDENT_AMBULATORY_CARE_PROVIDER_SITE_OTHER): Payer: Medicare Other | Admitting: Internal Medicine

## 2019-07-26 ENCOUNTER — Encounter: Payer: Self-pay | Admitting: Internal Medicine

## 2019-07-26 VITALS — BP 122/104 | HR 107 | Ht 70.0 in | Wt 285.0 lb

## 2019-07-26 DIAGNOSIS — I10 Essential (primary) hypertension: Secondary | ICD-10-CM | POA: Diagnosis not present

## 2019-07-26 DIAGNOSIS — D6869 Other thrombophilia: Secondary | ICD-10-CM

## 2019-07-26 DIAGNOSIS — I4819 Other persistent atrial fibrillation: Secondary | ICD-10-CM | POA: Diagnosis not present

## 2019-07-26 MED ORDER — FLECAINIDE ACETATE 50 MG PO TABS: 100.0000 mg | ORAL_TABLET | Freq: Two times a day (BID) | ORAL | 6 refills | Status: DC

## 2019-07-26 NOTE — Progress Notes (Signed)
Electrophysiology TeleHealth Note  Due to national recommendations of social distancing due to [place] 19, an audio telehealth visit is felt to be most appropriate for this patient at this time.  Verbal consent was obtained by me for the telehealth visit today.  The patient does not have capability for a virtual visit.  A phone visit is therefore required today.   Date:  07/26/2019   ID:  Eric Mayer, DOB 04/24/1941, MRN [id number]  Location: patient's home  Provider location:  [facility]  Evaluation Performed: Follow-up visit  PCP:  [name], MD   Electrophysiologist:  Dr Rayann Heman  Chief Complaint:  palpitations  History of Present Illness:    Eric Mayer is a 78 y.o. male who presents via telehealth conferencing today.  Since last being seen in our clinic, the patient reports doing very well.  He had a UTI 2 weeks ago and required antibiotics.  Over the past few days, he has developed rapid and irregular palpitations.  Today, he denies symptoms of chest pain, shortness of breath,  lower extremity edema, dizziness, presyncope, or syncope.  The patient is otherwise without complaint today.  The patient denies symptoms of fevers, chills, cough, or new SOB worrisome for COVID 19.  Past Medical History:  Diagnosis Date  . Anemia 1980s X 1  . Arthritis    "hands, knees, hips, ankles" (07/31/2015)  . Atrial flutter ([place]) 07/31/2015  . Chronic diastolic heart failure ([place])    a. 11/2015: Echo w/ EF of 60-65%, no WMA, Grade 2 DD, trivial AR, ascending aorta mildly dilated.   . Depression   . History of cardiovascular stress test    a. 03/2015: Dobutamine Stress Echo with no evidence of ischemia.   [name] History of gastric bypass   . History of peptic ulcer    1980's  . History of radiation therapy 1993   sarcoma of left groin, tx at [facility]  . History of sarcoma    1993  LEFT GOIN--  S/P SURGERY, RADIATION AND CHEMO IN [place]  . Hypertension   . Hypogonadism in  male   . Incomplete right bundle branch block   . 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 Nocturia   . OSA on CPAP   . Persistent atrial fibrillation ([place])       . Primary prostate adenocarcinoma ([place]) DX 07/08/14   Gleason 7,  stage T1c  . PVC's (premature ventricular contractions) 11/21/2015  . Sarcoma ([place]) ~ 1993/1994   "of groin"  . Weakness of left leg    SECONDARY TO SURGICAL NERVE INJURY OF LEFT GOIN  . Wears glasses     Past Surgical History:  Procedure Laterality Date  . ATRIAL FIBRILLATION ABLATION N/A 06/08/2018   Procedure: ATRIAL FIBRILLATION ABLATION;  Surgeon: Thompson Grayer, MD;  Location: [place] [facility];  Service: Cardiovascular;  Laterality: N/A;  . ATRIAL FIBRILLATION ABLATION N/A 12/20/2018   Procedure: ATRIAL FIBRILLATION ABLATION;  Surgeon: Thompson Grayer, MD;  Location: [place] [facility];  Service: Cardiovascular;  Laterality: N/A;  . CARDIAC CATHETERIZATION  08-12-2003  dr Tressia Miners turner   Normal coronary arteries, normal wall motion, no sig. abnormalities  . CARDIOVERSION N/A 08/04/2015   Procedure: CARDIOVERSION;  Surgeon: [name], MD;  Location: [place];  Service: Cardiovascular;  Laterality: N/A;  . CARDIOVERSION N/A 10/28/2017   Procedure: CARDIOVERSION;  Surgeon: Aundra Dubin,  Elby Showers, MD;  Location: [facility] ENDOSCOPY;  Service: Cardiovascular;  Laterality: N/A;  . CARDIOVERSION N/A 04/12/2018   Procedure: CARDIOVERSION;  Surgeon: Thayer Headings, MD;  Location: [facility] ENDOSCOPY;  Service: Cardiovascular;  Laterality: N/A;  . CARDIOVERSION N/A 07/26/2018   Procedure: CARDIOVERSION;  Surgeon: Larey Dresser, MD;  Location: [facility] ENDOSCOPY;  Service: Cardiovascular;  Laterality: N/A;  . CARDIOVERSION N/A 10/09/2018   Procedure: CARDIOVERSION;  Surgeon: Sueanne Margarita, MD;  Location: [facility] ENDOSCOPY;  Service: Cardiovascular;  Laterality: N/A;  . COLONOSCOPY  07-03-2002  .  [place]  . FRACTURE SURGERY    . GROIN DISSECTION Left 1992   [place]   SARCOMA SURGERY  . INGUINAL HERNIA REPAIR Right 1950s?  . INTESTINAL BYPASS  1976   GASTRIC FOR OBESITY  . JOINT REPLACEMENT    . PATELLA FRACTURE SURGERY Left ~ 1995   "broke it twice; only had OR once"  . PROSTATE BIOPSY  06/2014  . RADIOACTIVE SEED IMPLANT N/A 10/17/2014   Procedure: RADIOACTIVE SEED IMPLANT    ;  Surgeon: Ailene Rud, MD;  Location: [facility];  Service: Urology;  Laterality: N/A;   68 SEEDS IMPLANTED   . TEE WITHOUT CARDIOVERSION N/A 10/28/2017   Procedure: TRANSESOPHAGEAL ECHOCARDIOGRAM (TEE);  Surgeon: Larey Dresser, MD;  Location: [facility] ENDOSCOPY;  Service: Cardiovascular;  Laterality: N/A;  . TONSILLECTOMY  1950s  . TOTAL KNEE ARTHROPLASTY Right 12/17/2014   Procedure: TOTAL KNEE ARTHROPLASTY;  Surgeon: Melrose Nakayama, MD;  Location: [place];  Service: Orthopedics;  Laterality: Right;  . TRANSTHORACIC ECHOCARDIOGRAM  08-08-2003   moderate LVH/  ef 55-65%/  mild MR/  moderate LAE/  trivial TR/  trivial pericardial effusion posterior to the heart  . [place]  . VIDEO BRONCHOSCOPY Bilateral 09/21/2016   Procedure: VIDEO BRONCHOSCOPY WITHOUT FLUORO;  Surgeon: Collene Gobble, MD;  Location: [place];  Service: Cardiopulmonary;  Laterality: Bilateral;    Current Outpatient Medications  Medication Sig Dispense Refill  . acetaminophen (TYLENOL) 500 MG tablet Take 1,000 mg by mouth every 6 (six) hours as needed for moderate pain or headache.    . albuterol (PROVENTIL HFA;VENTOLIN HFA) 108 (90 Base) MCG/ACT inhaler Inhale 2 puffs into the lungs every 6 (six) hours as needed for wheezing or shortness of breath.    . bumetanide (BUMEX) 2 MG tablet Take 2 mg by mouth daily.    [name] buPROPion (WELLBUTRIN XL) 300 MG 24 hr tablet Take 300 mg by mouth daily.     . cholecalciferol (VITAMIN D) 25 MCG (1000 UT) tablet Take 1,000 Units by mouth 2  (two) times daily.     . clotrimazole (LOTRIMIN) 1 % cream Apply 1 application topically 2 (two) times daily as needed (itching).    . Coenzyme Q10 300 MG CAPS Take 300 mg by mouth at bedtime.    . cyanocobalamin (,VITAMIN B-12,) 1000 MCG/ML injection Inject 1,000 mcg into the muscle every 30 (thirty) days.    [name] diltiazem (CARDIZEM CD) 180 MG 24 hr capsule Take 180 mg by mouth daily.    [name] ELIQUIS 5 MG TABS tablet TAKE ONE TABLET BY MOUTH TWICE DAILY 180 tablet 1  . escitalopram (LEXAPRO) 10 MG tablet Take 10 mg by mouth daily.    . flecainide (TAMBOCOR) 50 MG tablet Take 1 tablet (50 mg total) by mouth 2 (two) times daily. 180 tablet 3  . losartan (COZAAR) 25 MG tablet TAKE ONE (1) TABLET  BY MOUTH EACH DAY 90 tablet 1  . magnesium oxide (MAG-OX) 400 MG tablet TAKE 2 TABLETS EACH MORNING AND 1 TABLETEACH EVENING. 270 tablet 0  . metoprolol tartrate (LOPRESSOR) 25 MG tablet Take 1 tablet (25 mg total) by mouth 2 (two) times daily. 180 tablet 2  . metoprolol tartrate (LOPRESSOR) 50 MG tablet TAKE ONE TABLET BY MOUTH TWICE DAILY 180 tablet 0  . Multiple Vitamin (MULTIVITAMIN WITH MINERALS) TABS tablet Take 1 tablet by mouth daily.    . Multiple Vitamins-Minerals (PRESERVISION AREDS 2 PO) Take 1 tablet by mouth 2 (two) times a day.    [name] Glycol-Propyl Glycol (SYSTANE ULTRA) 0.4-0.3 % SOLN Place 1 drop into both eyes daily as needed (for dry eyes).    . potassium chloride SA (KLOR-CON) 20 MEQ tablet Take 20 mEq by mouth 2 (two) times daily.    . tamsulosin (FLOMAX) 0.4 MG CAPS capsule Take 0.8 mg by mouth at bedtime.      No current facility-administered medications for this visit.    Allergies:   Ace inhibitors and Xarelto [rivaroxaban]   Social History:  The patient  reports that he has never smoked. He has never used smokeless tobacco. He reports that he does not drink alcohol or use drugs.   Family History:  The patient's  family history includes Atrial fibrillation in his father;  Cancer in his sister; Liver disease in his mother; Renal Disease in his mother; Stroke in his mother.   ROS:  Please see the history of present illness.   All other systems are personally reviewed and negative.    Exam:    Vital Signs:  BP (!) 122/104   Pulse (!) 107   Ht 5\' 10"  (1.778 m)   Wt 285 lb (129.3 kg)   BMI 40.89 kg/m   Well sounding, alert and conversant   Labs/Other Tests and Data Reviewed:    Recent Labs: 11/01/2018: ALT 28; TSH 3.519 12/18/2018: BUN 22; Creatinine, Ser 1.00; Hemoglobin 11.3; Platelets 192; Potassium 5.1; Sodium 143   Wt Readings from Last 3 Encounters:  07/26/19 285 lb (129.3 kg)  06/06/19 281 lb (127.5 kg)  04/12/19 289 lb 6.4 oz (131.3 kg)    I have reviewed multiple apple watch tracings from 12/7 and 07/24/2019 which show afib, sinus with PACs and SVT (Likely atypical atrial flutter)   ASSESSMENT & PLAN:    1.  Persistent atrial fibrillation/ atypical atrial flutter Recently increased atrial arrhythmias.  This may be related to recent UTI Increase flecainide to 100mg  BID Continue anticoagulation. I would not be excited about additional ablation Hopefully his arrhythmia will settle out as he recovers from his UTI  chads2vasc score is 3.   2. HTN Stable No change required today  3. Overweight Lifestyle modification encouraged  4. Chronic diastolic dysfunction Stable No change required today   Follow-up:  3 weeks in AF clinic   Patient Risk:  after full review of this patients clinical status, I feel that they are at moderate risk at this time.  Today, I have spent 15 minutes with the patient with telehealth technology discussing arrhythmia management .    SignedThompson Grayer, MD  07/26/2019 3:56 PM     [place] [address] [place] [address] [phone] (office) [phone] (fax)

## 2019-08-14 ENCOUNTER — Other Ambulatory Visit: Payer: Self-pay | Admitting: Internal Medicine

## 2019-08-20 ENCOUNTER — Ambulatory Visit (HOSPITAL_COMMUNITY)
Admission: RE | Admit: 2019-08-20 | Discharge: 2019-08-20 | Disposition: A | Discharge status: Home / Self Care | Payer: Medicare Other | Source: Ambulatory Visit | Attending: Nurse Practitioner | Admitting: Nurse Practitioner

## 2019-08-20 ENCOUNTER — Encounter (HOSPITAL_COMMUNITY): Payer: Self-pay | Admitting: Nurse Practitioner

## 2019-08-20 ENCOUNTER — Other Ambulatory Visit: Payer: Self-pay

## 2019-08-20 VITALS — BP 114/70 | HR 105 | Ht 70.0 in | Wt 292.2 lb

## 2019-08-20 DIAGNOSIS — Z79899 Other long term (current) drug therapy: Secondary | ICD-10-CM | POA: Insufficient documentation

## 2019-08-20 DIAGNOSIS — I110 Hypertensive heart disease with heart failure: Secondary | ICD-10-CM | POA: Insufficient documentation

## 2019-08-20 DIAGNOSIS — Z9884 Bariatric surgery status: Secondary | ICD-10-CM | POA: Diagnosis not present

## 2019-08-20 DIAGNOSIS — I480 Paroxysmal atrial fibrillation: Secondary | ICD-10-CM | POA: Diagnosis not present

## 2019-08-20 DIAGNOSIS — D6869 Other thrombophilia: Secondary | ICD-10-CM | POA: Diagnosis not present

## 2019-08-20 DIAGNOSIS — F329 Major depressive disorder, single episode, unspecified: Secondary | ICD-10-CM | POA: Insufficient documentation

## 2019-08-20 DIAGNOSIS — I4891 Unspecified atrial fibrillation: Secondary | ICD-10-CM | POA: Diagnosis not present

## 2019-08-20 DIAGNOSIS — G4733 Obstructive sleep apnea (adult) (pediatric): Secondary | ICD-10-CM | POA: Insufficient documentation

## 2019-08-20 DIAGNOSIS — I484 Atypical atrial flutter: Secondary | ICD-10-CM | POA: Diagnosis not present

## 2019-08-20 DIAGNOSIS — Z7901 Long term (current) use of anticoagulants: Secondary | ICD-10-CM | POA: Insufficient documentation

## 2019-08-20 DIAGNOSIS — Z8546 Personal history of malignant neoplasm of prostate: Secondary | ICD-10-CM | POA: Insufficient documentation

## 2019-08-20 DIAGNOSIS — E663 Overweight: Secondary | ICD-10-CM | POA: Insufficient documentation

## 2019-08-20 DIAGNOSIS — I5032 Chronic diastolic (congestive) heart failure: Secondary | ICD-10-CM | POA: Diagnosis not present

## 2019-08-20 DIAGNOSIS — Z6841 Body Mass Index (BMI) 40.0 and over, adult: Secondary | ICD-10-CM | POA: Diagnosis not present

## 2019-08-20 DIAGNOSIS — M1990 Unspecified osteoarthritis, unspecified site: Secondary | ICD-10-CM | POA: Insufficient documentation

## 2019-08-20 NOTE — Progress Notes (Signed)
Date:  08/20/2019   ID:  Eric Mayer, DOB 1940/11/02, MRN [id number]  Location: clinic   Provider location:  [facility]  Evaluation Performed: Follow-up    PCP:  [name], MD   Electrophysiologist:  Dr Rayann Heman  Chief Complaint:  palpitations  History of Present Illness:    Eric Mayer is a 79 y.o. male with afib ablation x 2 in the afib office for f/u. She reports overall he is doing ok. Has ongoing issues with fatigue. Wears an apple watch and shows me graphs of his HR. For the majority of time he has heart rates  less than 85 bpm.  EKG today shows irregular rhythm, possible afib but I feel it is in a pattern of a Sinus beat with two early beats grouped together. He and his wife will be getting the covid vaccine in their facility next week.   Past Medical History:  Diagnosis Date  . Anemia 1980s X 1  . Arthritis    "hands, knees, hips, ankles" (07/31/2015)  . Atrial flutter ([place]) 07/31/2015  . Chronic diastolic heart failure ([place])    a. 11/2015: Echo w/ EF of 60-65%, no WMA, Grade 2 DD, trivial AR, ascending aorta mildly dilated.   . Depression   . History of cardiovascular stress test    a. 03/2015: Dobutamine Stress Echo with no evidence of ischemia.   [name] History of gastric bypass   . History of peptic ulcer    1980's  . History of radiation therapy 1993   sarcoma of left groin, tx at [facility]  . History of sarcoma    1993  LEFT GOIN--  S/P SURGERY, RADIATION AND CHEMO IN [place]  . Hypertension   . Hypogonadism in male   . Incomplete right bundle branch block   . 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 Nocturia   . OSA on CPAP   . Persistent atrial fibrillation ([place])       . Primary prostate adenocarcinoma ([place]) DX 07/08/14   Gleason 7,  stage T1c  . PVC's (premature ventricular contractions) 11/21/2015  . 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  Weakness of left leg    SECONDARY TO SURGICAL NERVE INJURY OF LEFT GOIN  . Wears glasses     Past Surgical History:  Procedure Laterality Date  . ATRIAL FIBRILLATION ABLATION N/A 06/08/2018   Procedure: ATRIAL FIBRILLATION ABLATION;  Surgeon: Thompson Grayer, MD;  Location: [place] CV LAB;  Service: Cardiovascular;  Laterality: N/A;  . ATRIAL FIBRILLATION ABLATION N/A 12/20/2018   Procedure: ATRIAL FIBRILLATION ABLATION;  Surgeon: Thompson Grayer, MD;  Location: [place] [facility] LAB;  Service: Cardiovascular;  Laterality: N/A;  . CARDIAC CATHETERIZATION  08-12-2003  dr Tressia Miners turner   Normal coronary arteries, normal wall motion, no sig. abnormalities  . CARDIOVERSION N/A 08/04/2015   Procedure: CARDIOVERSION;  Surgeon: [name], MD;  Location: [place];  Service: Cardiovascular;  Laterality: N/A;  . CARDIOVERSION N/A 10/28/2017   Procedure: CARDIOVERSION;  Surgeon: Larey Dresser, MD;  Location: [facility] ENDOSCOPY;  Service: Cardiovascular;  Laterality: N/A;  . CARDIOVERSION N/A 04/12/2018   Procedure: CARDIOVERSION;  Surgeon: Thayer Headings, MD;  Location: [facility] ENDOSCOPY;  Service: Cardiovascular;  Laterality: N/A;  . CARDIOVERSION N/A 07/26/2018   Procedure: CARDIOVERSION;  Surgeon: Larey Dresser, MD;  Location: [facility] ENDOSCOPY;  Service: Cardiovascular;  Laterality: N/A;  . CARDIOVERSION N/A 10/09/2018   Procedure: CARDIOVERSION;  Surgeon: Sueanne Margarita, MD;  [facility] END[facility]COPY;  Service: Cardiovascular;  Laterality: N/A;  . COLONOSCOPY  07-03-2002  . [place]  . FRACTURE SURGERY    . GROIN DISSECTION Left 1992   [place]   SARCOMA SURGERY  . INGUINAL HERNIA REPAIR Right 1950s?  . INTESTINAL BYPASS  1976   GASTRIC FOR OBESITY  . JOINT REPLACEMENT    . PATELLA FRACTURE SURGERY Left ~ 1995   "broke it twice; only had OR once"  . PROSTATE BIOPSY  06/2014  . RADIOACTIVE SEED IMPLANT N/A 10/17/2014   Procedure: RADIOACTIVE SEED IMPLANT    ;  Surgeon:  Ailene Rud, MD;  Location: [facility];  Service: Urology;  Laterality: N/A;   68 SEEDS IMPLANTED   . TEE WITHOUT CARDIOVERSION N/A 10/28/2017   Procedure: TRANSESOPHAGEAL ECHOCARDIOGRAM (TEE);  Surgeon: Larey Dresser, MD;  Location: [facility] ENDOSCOPY;  Service: Cardiovascular;  Laterality: N/A;  . TONSILLECTOMY  1950s  . TOTAL KNEE ARTHROPLASTY Right 12/17/2014   Procedure: TOTAL KNEE ARTHROPLASTY;  Surgeon: Melrose Nakayama, MD;  Location: [place];  Service: Orthopedics;  Laterality: Right;  . TRANSTHORACIC ECHOCARDIOGRAM  08-08-2003   moderate LVH/  ef 55-65%/  mild MR/  moderate LAE/  trivial TR/  trivial pericardial effusion posterior to the heart  . [place]  . VIDEO BRONCHOSCOPY Bilateral 09/21/2016   Procedure: VIDEO BRONCHOSCOPY WITHOUT FLUORO;  Surgeon: Collene Gobble, MD;  Location: [place];  Service: Cardiopulmonary;  Laterality: Bilateral;    Current Outpatient Medications  Medication Sig Dispense Refill  . acetaminophen (TYLENOL) 500 MG tablet Take 1,000 mg by mouth every 6 (six) hours as needed for moderate pain or headache.    . albuterol (PROVENTIL HFA;VENTOLIN HFA) 108 (90 Base) MCG/ACT inhaler Inhale 2 puffs into the lungs every 6 (six) hours as needed for wheezing or shortness of breath.    . bumetanide (BUMEX) 2 MG tablet Take 2 mg by mouth daily.    [name] buPROPion (WELLBUTRIN XL) 300 MG 24 hr tablet Take 300 mg by mouth daily.     . cholecalciferol (VITAMIN D) 25 MCG (1000 UT) tablet Take 1,000 Units by mouth 2 (two) times daily.     . clotrimazole (LOTRIMIN) 1 % cream Apply 1 application topically 2 (two) times daily as needed (itching).    . Coenzyme Q10 300 MG CAPS Take 300 mg by mouth at bedtime.    . cyanocobalamin (,VITAMIN B-12,) 1000 MCG/ML injection Inject 1,000 mcg into the muscle every 30 (thirty) days.    [name] diltiazem (CARDIZEM CD) 180 MG 24 hr capsule Take 180 mg by mouth daily.    [name] ELIQUIS 5 MG TABS tablet TAKE ONE TABLET  BY MOUTH TWICE DAILY 180 tablet 1  . escitalopram (LEXAPRO) 10 MG tablet Take 10 mg by mouth daily.    . flecainide (TAMBOCOR) 50 MG tablet Take 2 tablets (100 mg total) by mouth 2 (two) times daily. 180 tablet 6  . magnesium oxide (MAG-OX) 400 MG tablet TAKE 2 TABLETS EACH MORNING AND 1 TABLETEACH EVENING. 270 tablet 3  . metoprolol tartrate (LOPRESSOR) 50 MG tablet TAKE ONE TABLET BY MOUTH TWICE DAILY 180 tablet 0  . Multiple Vitamin (MULTIVITAMIN WITH MINERALS) TABS tablet Take 1 tablet by mouth daily.    . Multiple Vitamins-Minerals (PRESERVISION AREDS 2 PO) Take 1 tablet by mouth 2 (  two) times a day.    [name] Glycol-Propyl Glycol (SYSTANE ULTRA) 0.4-0.3 % SOLN Place 1 drop into both eyes daily as needed (for dry eyes).    . potassium chloride SA (KLOR-CON) 20 MEQ tablet Take 20 mEq by mouth 2 (two) times daily.    . tamsulosin (FLOMAX) 0.4 MG CAPS capsule Take 0.8 mg by mouth at bedtime.      No current facility-administered medications for this encounter.    Allergies:   Ace inhibitors and Xarelto [rivaroxaban]   Social History:  The patient  reports that he has never smoked. He has never used smokeless tobacco. He reports that he does not drink alcohol or use drugs.   Family History:  The patient's  family history includes Atrial fibrillation in his father; Cancer in his sister; Liver disease in his mother; Renal Disease in his mother; Stroke in his mother.   ROS:  Please see the history of present illness.   All other systems are personally reviewed and negative.    Exam:   GEN- The patient is well appearing, alert and oriented x 3 today.   Head- normocephalic, atraumatic Eyes-  Sclera clear, conjunctiva pink Ears- hearing intact Oropharynx- clear Neck- supple, no JVP Lymph- no cervical lymphadenopathy Lungs- Clear to ausculation bilaterally, normal work of breathing Heart- regular rate and irregular  rhythm, no murmurs, rubs or gallops, PMI not laterally displaced GI-  soft, NT, ND, + BS Extremities- no clubbing, cyanosis, or edema MS- no significant deformity or atrophy Skin- no rash or lesion Psych- euthymic mood, full affect Neuro- strength and sensation are intact   EKG- afib at 105 bpm, vrs Sinus beat followed by 2 early beats.LAFB    Vital Signs:  BP 114/70   Pulse (!) 105   Ht 5\' 10"  (1.778 m)   Wt 132.5 kg   BMI 41.93 kg/m     Labs/Other Tests and Data Reviewed:    Recent Labs: 11/01/2018: ALT 28; TSH 3.519 12/18/2018: BUN 22; Creatinine, Ser 1.00; Hemoglobin 11.3; Platelets 192; Potassium 5.1; Sodium 143   Wt Readings from Last 3 Encounters:  08/20/19 132.5 kg  07/26/19 129.3 kg  06/06/19 127.5 kg      ASSESSMENT & PLAN:    1.Atrial fibrillation/ atypical atrial flutter Continue  Flecainide 100mg  BID Continue metoprolol 50 mg bid  Continue  diltiazem 180 mg qd  Discussed with pt his concern with ongoing fatigue if he is having a higher high fib burden than he appreciates and a Linq may help to determine this He will think about it and inform Dr. Jackalyn Lombard office is he wishes to do this   2. Chads2vasc score is 3.  Continue eliquis 5 mg bid   2. HTN Stable No change required today Now off losartan for low Blood pressures in past   3. Overweight Lifestyle modification encouraged  4. Chronic diastolic dysfunction Stable No change required today   Follow-up:  With Dr. Rayann Heman in 3 months, would prefer virtual and he will contact Dr. Jackalyn Lombard  office if he decides to go with a Linq.    Patient Risk:  after full review of this patients clinical status, I feel that they are at moderate risk at this time.   Signed, Roderic Palau, NP  08/20/2019 1:28 PM     [address] [place] [address] [phone] (office) [phone] (fax)

## 2019-09-17 ENCOUNTER — Other Ambulatory Visit: Payer: Self-pay | Admitting: Internal Medicine

## 2019-09-17 ENCOUNTER — Other Ambulatory Visit: Payer: Self-pay | Admitting: Cardiology

## 2019-11-19 ENCOUNTER — Telehealth: Payer: Self-pay

## 2019-11-19 ENCOUNTER — Telehealth (INDEPENDENT_AMBULATORY_CARE_PROVIDER_SITE_OTHER): Payer: Medicare Other | Admitting: Internal Medicine

## 2019-11-19 ENCOUNTER — Other Ambulatory Visit: Payer: Self-pay

## 2019-11-19 ENCOUNTER — Encounter: Payer: Self-pay | Admitting: Internal Medicine

## 2019-11-19 VITALS — BP 100/54 | HR 70 | Ht 70.0 in | Wt 282.0 lb

## 2019-11-19 DIAGNOSIS — I4819 Other persistent atrial fibrillation: Secondary | ICD-10-CM

## 2019-11-19 DIAGNOSIS — I5032 Chronic diastolic (congestive) heart failure: Secondary | ICD-10-CM

## 2019-11-19 DIAGNOSIS — I10 Essential (primary) hypertension: Secondary | ICD-10-CM

## 2019-11-19 MED ORDER — AMIODARONE HCL 200 MG PO TABS: ORAL_TABLET | ORAL | 3 refills | Status: DC

## 2019-11-19 NOTE — Telephone Encounter (Signed)
Called and spoke with patient.  He is aware of appt 12/17/19 with Roderic Palau, NP.

## 2019-11-19 NOTE — Progress Notes (Signed)
Electrophysiology TeleHealth Note   Due to national recommendations of social distancing due to COVID 19, an audio/video telehealth visit is felt to be most appropriate for this patient at this time.  See MyChart message from today for the patient's consent to telehealth for Eric Mayer.  Date:  11/19/2019   ID:  Eric Mayer, DOB [birth year], MRN [id number]  Location: patient's home  Provider location:  [facility]  Evaluation Performed: Follow-up visit  PCP:  [name], MD   Electrophysiologist:  Dr Rayann Heman  Chief Complaint:  palpitations  History of Present Illness:    Eric Mayer is a 79 y.o. male who presents via telehealth conferencing today.  Since last being seen in our clinic, the patient reports doing reasonably well.  He continues to have afib.  + palpitations and reduce exercise capacity.  States he has fatigue and SOB so that he cannot exercise.  Today, he denies symptoms of chest pain,   lower extremity edema, dizziness, presyncope, or syncope.  The patient is otherwise without complaint today.    Past Medical History:  Diagnosis Date  . Anemia 1980s X 1  . Arthritis    "hands, knees, hips, ankles" (07/31/2015)  . Atrial flutter ([place]) 07/31/2015  . Chronic diastolic heart failure ([place])    a. 11/2015: Echo w/ EF of 60-65%, no WMA, Grade 2 DD, trivial AR, ascending aorta mildly dilated.   . Depression   . History of cardiovascular stress test    a. 03/2015: Dobutamine Stress Echo with no evidence of ischemia.   [name] History of gastric bypass   . History of peptic ulcer    1980's  . History of radiation therapy 1993   sarcoma of left groin, tx at [facility]  . History of sarcoma    1993  LEFT GOIN--  S/P SURGERY, RADIATION AND CHEMO IN [place]  . Hypertension   . Hypogonadism in male   . Incomplete right bundle branch block   . Nerve injury    SURGICAL NERVE INJURY S/P  LEFT GOIN REMOVAL SARCOMA 1992--  RESIDUAL WEAKNESS AND NUMBNESS UPPER LEFT  LEG  . Nocturia   . OSA on CPAP   . Persistent atrial fibrillation ([place])       . Primary prostate adenocarcinoma ([place]) DX 07/08/14   Gleason 7,  stage T1c  . PVC's (premature ventricular contractions) 11/21/2015  . 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 Weakness of left leg    SECONDARY TO SURGICAL NERVE INJURY OF LEFT GOIN  . Wears glasses     Past Surgical History:  Procedure Laterality Date  . ATRIAL FIBRILLATION ABLATION N/A 06/08/2018   Procedure: ATRIAL FIBRILLATION ABLATION;  Surgeon: Thompson Grayer, MD;  Location: [place] CV LAB;  Service: Cardiovascular;  Laterality: N/A;  . ATRIAL FIBRILLATION ABLATION N/A 12/20/2018   Procedure: ATRIAL FIBRILLATION ABLATION;  Surgeon: Thompson Grayer, MD;  Location: [place] [facility] LAB;  Service: Cardiovascular;  Laterality: N/A;  . CARDIAC CATHETERIZATION  08-12-2003  dr Tressia Miners turner   Normal coronary arteries, normal wall motion, no sig. abnormalities  . CARDIOVERSION N/A 08/04/2015   Procedure: CARDIOVERSION;  Surgeon: [name], MD;  Location: [place];  Service: Cardiovascular;  Laterality: N/A;  . CARDIOVERSION N/A 10/28/2017   Procedure: CARDIOVERSION;  Surgeon: Larey Dresser, MD;  Location: [facility] ENDOSCOPY;  Service: Cardiovascular;  Laterality: N/A;  . CARDIOVERSION N/A 04/12/2018   Procedure: CARDIOVERSION;  Surgeon: Thayer Headings, MD;  Location: [facility]  ENDOSCOPY;  Service: Cardiovascular;  Laterality: N/A;  . CARDIOVERSION N/A 07/26/2018   Procedure: CARDIOVERSION;  Surgeon: Larey Dresser, MD;  Location: [facility] ENDOSCOPY;  Service: Cardiovascular;  Laterality: N/A;  . CARDIOVERSION N/A 10/09/2018   Procedure: CARDIOVERSION;  Surgeon: Sueanne Margarita, MD;  Location: [facility] ENDOSCOPY;  Service: Cardiovascular;  Laterality: N/A;  . COLONOSCOPY  07-03-2002  . [place]  . FRACTURE SURGERY    . GROIN DISSECTION Left 1992   [place]   SARCOMA SURGERY  . INGUINAL HERNIA REPAIR Right 1950s?  . INTESTINAL  BYPASS  1976   GASTRIC FOR OBESITY  . JOINT REPLACEMENT    . PATELLA FRACTURE SURGERY Left ~ 1995   "broke it twice; only had OR once"  . PROSTATE BIOPSY  06/2014  . RADIOACTIVE SEED IMPLANT N/A 10/17/2014   Procedure: RADIOACTIVE SEED IMPLANT    ;  Surgeon: Ailene Rud, MD;  Location: [facility];  Service: Urology;  Laterality: N/A;   68 SEEDS IMPLANTED   . TEE WITHOUT CARDIOVERSION N/A 10/28/2017   Procedure: TRANSESOPHAGEAL ECHOCARDIOGRAM (TEE);  Surgeon: Larey Dresser, MD;  Location: [facility] ENDOSCOPY;  Service: Cardiovascular;  Laterality: N/A;  . TONSILLECTOMY  1950s  . TOTAL KNEE ARTHROPLASTY Right 12/17/2014   Procedure: TOTAL KNEE ARTHROPLASTY;  Surgeon: Melrose Nakayama, MD;  Location: [place];  Service: Orthopedics;  Laterality: Right;  . TRANSTHORACIC ECHOCARDIOGRAM  08-08-2003   moderate LVH/  ef 55-65%/  mild MR/  moderate LAE/  trivial TR/  trivial pericardial effusion posterior to the heart  . [place]  . VIDEO BRONCHOSCOPY Bilateral 09/21/2016   Procedure: VIDEO BRONCHOSCOPY WITHOUT FLUORO;  Surgeon: Collene Gobble, MD;  Location: [place];  Service: Cardiopulmonary;  Laterality: Bilateral;    Current Outpatient Medications  Medication Sig Dispense Refill  . acetaminophen (TYLENOL) 500 MG tablet Take 1,000 mg by mouth every 6 (six) hours as needed for moderate pain or headache.    . albuterol (PROVENTIL HFA;VENTOLIN HFA) 108 (90 Base) MCG/ACT inhaler Inhale 2 puffs into the lungs every 6 (six) hours as needed for wheezing or shortness of breath.    . bumetanide (BUMEX) 2 MG tablet Take 2 mg by mouth daily.    [name] buPROPion (WELLBUTRIN XL) 300 MG 24 hr tablet Take 300 mg by mouth daily.     . cholecalciferol (VITAMIN D) 25 MCG (1000 UT) tablet Take 1,000 Units by mouth 2 (two) times daily.     . clotrimazole (LOTRIMIN) 1 % cream Apply 1 application topically 2 (two) times daily as needed (itching).    . Coenzyme Q10 300 MG CAPS Take 300 mg  by mouth at bedtime.    . cyanocobalamin (,VITAMIN B-12,) 1000 MCG/ML injection Inject 1,000 mcg into the muscle every 30 (thirty) days.    [name] diltiazem (CARDIZEM CD) 180 MG 24 hr capsule Take 180 mg by mouth daily.    [name] ELIQUIS 5 MG TABS tablet TAKE ONE TABLET BY MOUTH TWICE DAILY 180 tablet 1  . escitalopram (LEXAPRO) 10 MG tablet Take 10 mg by mouth daily.    . flecainide (TAMBOCOR) 50 MG tablet Take 2 tablets (100 mg total) by mouth 2 (two) times daily. 180 tablet 6  . magnesium oxide (MAG-OX) 400 MG tablet TAKE 2 TABLETS EACH MORNING AND 1 TABLETEACH EVENING. 270 tablet 3  . metoprolol tartrate (LOPRESSOR) 25 MG tablet Take 25 mg by mouth 2 (two) times daily.    . metoprolol  tartrate (LOPRESSOR) 50 MG tablet TAKE ONE TABLET BY MOUTH TWICE DAILY 180 tablet 3  . Multiple Vitamin (MULTIVITAMIN WITH MINERALS) TABS tablet Take 1 tablet by mouth daily.    . Multiple Vitamins-Minerals (PRESERVISION AREDS 2 PO) Take 1 tablet by mouth 2 (two) times a day.    [name] Glycol-Propyl Glycol (SYSTANE ULTRA) 0.4-0.3 % SOLN Place 1 drop into both eyes daily as needed (for dry eyes).    . potassium chloride SA (KLOR-CON) 20 MEQ tablet Take 1 tablet (20 mEq total) by mouth 2 (two) times daily. 180 tablet 3  . tamsulosin (FLOMAX) 0.4 MG CAPS capsule Take 0.8 mg by mouth at bedtime.      No current facility-administered medications for this visit.    Allergies:   Ace inhibitors and Xarelto [rivaroxaban]   Social History:  The patient  reports that he has never smoked. He has never used smokeless tobacco. He reports that he does not drink alcohol or use drugs.   ROS:  Please see the history of present illness.   All other systems are personally reviewed and negative.    Exam:    Vital Signs:  BP (!) 100/54   Pulse 70   Ht 5\' 10"  (1.778 m)   Wt 282 lb (127.9 kg)   BMI 40.46 kg/m   Well sounding and appearing, alert and conversant, regular work of breathing,  good skin color Eyes- anicteric,  neuro- grossly intact, skin- no apparent rash or lesions or cyanosis, mouth- oral mucosa is pink  Labs/Other Tests and Data Reviewed:    Recent Labs: 12/18/2018: BUN 22; Creatinine, Ser 1.00; Hemoglobin 11.3; Platelets 192; Potassium 5.1; Sodium 143   Wt Readings from Last 3 Encounters:  11/19/19 282 lb (127.9 kg)  08/20/19 292 lb 3.2 oz (132.5 kg)  07/26/19 285 lb (129.3 kg)     apple watch tracings from this morning are personally reviewed.  These reveal sinus with PACs,  Afib, and subsequently sinus rhythm  ASSESSMENT & PLAN:    1.  Persistent afib/ atypical atrial flutter S/p ablation x 2 With his second ablation, he had atypical atrial flutter ablated.  PVs were mostly isolated from prior.  I am not convinced that additional ablation for afib would be beneficial currently.   Chads2vasc score is 3.  he is anticoagulated with eliquis .  Therapeutic strategies for afib including tikosyn and amiodarone were discussed in detail with the patient today. Risk, benefits, and alternatives to each medicine were discussed.  He would prefer amiodarone as he is not interested in hospitalization.  He understands risks of amiodarone including liver, thyroid and lung toxicity and wishes to proceed.  The importance of close follow-up to avoid toxicity was discussed.  Stop flecainide Start amiodarone 200mg  BID iin 48 hours  Continue BID x 4 weeks then 200mg  daily.  Follow-up in AF clinic in 4 weeks.  Will need PFTs and labs then  2. HTN Stable No change required today  3. Obesity S/p prior bariatric surgery Importance of lifestyle modification was stressed today  4 chronic diastolic dysfunction Stable No change required today   Follow-up:  4 weeks with AF CLINIC   Patient Risk:  after full review of this patients clinical status, I feel that they are at moderate risk at this time.  Today, I have spent 15 minutes with the patient with telehealth technology discussing arrhythmia  management .    Signed, Thompson Grayer, MD  11/19/2019 12:10 PM     [facility]  [address] [place] [address] [phone] (office) [phone] (fax)

## 2019-11-19 NOTE — Telephone Encounter (Signed)
-----   Message from Thompson Grayer, MD sent at 11/19/2019 12:17 PM EDT ----- Stop flecainide In 48 hours start amiodarone 200mg  BID x 4 weeks, then 200mg  daily Follow-up in AF clinic in 4 weeks

## 2019-12-11 ENCOUNTER — Other Ambulatory Visit: Payer: Self-pay | Admitting: Cardiology

## 2019-12-11 NOTE — Telephone Encounter (Signed)
Prescription refill request for Eliquis received.  Last office visit: 11/19/2019, [place]red Scr: 1.0, 12/18/2018 Age: 79 y.o. Weight: 127.9 kg   Prescription refill sent.

## 2019-12-17 ENCOUNTER — Ambulatory Visit (HOSPITAL_COMMUNITY)
Admission: RE | Admit: 2019-12-17 | Discharge: 2019-12-17 | Disposition: A | Discharge status: Home / Self Care | Payer: Medicare Other | Source: Ambulatory Visit | Attending: Nurse Practitioner | Admitting: Nurse Practitioner

## 2019-12-17 ENCOUNTER — Other Ambulatory Visit: Payer: Self-pay

## 2019-12-17 ENCOUNTER — Encounter (HOSPITAL_COMMUNITY): Payer: Self-pay | Admitting: Nurse Practitioner

## 2019-12-17 VITALS — BP 118/62 | HR 80 | Ht 70.0 in | Wt 290.8 lb

## 2019-12-17 DIAGNOSIS — Z923 Personal history of irradiation: Secondary | ICD-10-CM | POA: Diagnosis not present

## 2019-12-17 DIAGNOSIS — I110 Hypertensive heart disease with heart failure: Secondary | ICD-10-CM | POA: Diagnosis not present

## 2019-12-17 DIAGNOSIS — Z7901 Long term (current) use of anticoagulants: Secondary | ICD-10-CM | POA: Insufficient documentation

## 2019-12-17 DIAGNOSIS — Z9221 Personal history of antineoplastic chemotherapy: Secondary | ICD-10-CM | POA: Diagnosis not present

## 2019-12-17 DIAGNOSIS — G4733 Obstructive sleep apnea (adult) (pediatric): Secondary | ICD-10-CM | POA: Diagnosis not present

## 2019-12-17 DIAGNOSIS — Z823 Family history of stroke: Secondary | ICD-10-CM | POA: Diagnosis not present

## 2019-12-17 DIAGNOSIS — Z79899 Other long term (current) drug therapy: Secondary | ICD-10-CM | POA: Insufficient documentation

## 2019-12-17 DIAGNOSIS — I4819 Other persistent atrial fibrillation: Secondary | ICD-10-CM

## 2019-12-17 DIAGNOSIS — Z9884 Bariatric surgery status: Secondary | ICD-10-CM | POA: Diagnosis not present

## 2019-12-17 DIAGNOSIS — Z888 Allergy status to other drugs, medicaments and biological substances status: Secondary | ICD-10-CM | POA: Diagnosis not present

## 2019-12-17 DIAGNOSIS — Z8379 Family history of other diseases of the digestive system: Secondary | ICD-10-CM | POA: Diagnosis not present

## 2019-12-17 DIAGNOSIS — Z8711 Personal history of peptic ulcer disease: Secondary | ICD-10-CM | POA: Diagnosis not present

## 2019-12-17 DIAGNOSIS — Z8546 Personal history of malignant neoplasm of prostate: Secondary | ICD-10-CM | POA: Insufficient documentation

## 2019-12-17 DIAGNOSIS — I5032 Chronic diastolic (congestive) heart failure: Secondary | ICD-10-CM | POA: Diagnosis not present

## 2019-12-17 DIAGNOSIS — F329 Major depressive disorder, single episode, unspecified: Secondary | ICD-10-CM | POA: Insufficient documentation

## 2019-12-17 DIAGNOSIS — Z841 Family history of disorders of kidney and ureter: Secondary | ICD-10-CM | POA: Diagnosis not present

## 2019-12-17 DIAGNOSIS — I480 Paroxysmal atrial fibrillation: Secondary | ICD-10-CM | POA: Diagnosis not present

## 2019-12-17 DIAGNOSIS — D6869 Other thrombophilia: Secondary | ICD-10-CM | POA: Diagnosis not present

## 2019-12-17 DIAGNOSIS — Z8249 Family history of ischemic heart disease and other diseases of the circulatory system: Secondary | ICD-10-CM | POA: Diagnosis not present

## 2019-12-17 DIAGNOSIS — Z85831 Personal history of malignant neoplasm of soft tissue: Secondary | ICD-10-CM | POA: Insufficient documentation

## 2019-12-17 DIAGNOSIS — I4891 Unspecified atrial fibrillation: Secondary | ICD-10-CM | POA: Diagnosis present

## 2019-12-17 DIAGNOSIS — Z96651 Presence of right artificial knee joint: Secondary | ICD-10-CM | POA: Diagnosis not present

## 2019-12-17 LAB — COMPREHENSIVE METABOLIC PANEL
ALT: 17 U/L (ref 0–44)
AST: 16 U/L (ref 15–41)
Albumin: 3.2 g/dL — ABNORMAL LOW (ref 3.5–5.0)
Alkaline Phosphatase: 88 U/L (ref 38–126)
Anion gap: 8 (ref 5–15)
BUN: 25 mg/dL — ABNORMAL HIGH (ref 8–23)
CO2: 26 mmol/L (ref 22–32)
Calcium: 9.2 mg/dL (ref 8.9–10.3)
Chloride: 112 mmol/L — ABNORMAL HIGH (ref 98–111)
Creatinine, Ser: 1.04 mg/dL (ref 0.61–1.24)
GFR calc Af Amer: >60 mL/min (ref >60)
GFR calc non Af Amer: >60 mL/min (ref >60)
Glucose, Bld: 101 mg/dL — ABNORMAL HIGH (ref 70–99)
Potassium: 5.3 mmol/L — ABNORMAL HIGH (ref 3.5–5.1)
Sodium: 146 mmol/L — ABNORMAL HIGH (ref 135–145)
Total Bilirubin: 0.6 mg/dL (ref 0.3–1.2)
Total Protein: 5.8 g/dL — ABNORMAL LOW (ref 6.5–8.1)

## 2019-12-17 LAB — TSH: TSH: 3.163 u[IU]/mL (ref 0.350–4.500)

## 2019-12-17 MED ORDER — AMIODARONE HCL 200 MG PO TABS: 200.0000 mg | ORAL_TABLET | Freq: Every day | ORAL | Status: DC

## 2019-12-17 NOTE — Progress Notes (Signed)
Primary Care Physician: [name], MD Referring Physician:Dr. Aretta Nip is a 79 y.o. male with a h/o atrail fibrillation with h/o 2 ablations. He continued to have runs of afib with shortness of breath and fatigue. Dr. Rayann Heman did not think he was a good candidate for repeat ablation. He was given options of amiodarone vrs tikosyn. He  wanted to try amiodarone again. In the past this was stopped as it was felt it may  have been contributing to his shortness of breath. He loaded on drug for the last month at 200 mg bid and is now on 200 mg daily. He is in SR today. He still feels as if he has episodes of racing. qtc is stable. Remains on eliquis 5 mg bid with a CHA2DS2VASc score of at least 4.  Today, he denies symptoms of palpitations, chest pain, shortness of breath, orthopnea, PND, lower extremity edema, dizziness, presyncope, syncope, or neurologic sequela. The patient is tolerating medications without difficulties and is otherwise without complaint today.   Past Medical History:  Diagnosis Date  . Anemia 1980s X 1  . Arthritis    "hands, knees, hips, ankles" (07/31/2015)  . Atrial flutter ([place]) 07/31/2015  . Chronic diastolic heart failure ([place])    a. 11/2015: Echo w/ EF of 60-65%, no WMA, Grade 2 DD, trivial AR, ascending aorta mildly dilated.   . Depression   . History of cardiovascular stress test    a. 03/2015: Dobutamine Stress Echo with no evidence of ischemia.   [name] History of gastric bypass   . History of peptic ulcer    1980's  . History of radiation therapy 1993   sarcoma of left groin, tx at [facility]  . History of sarcoma    1993  LEFT GOIN--  S/P SURGERY, RADIATION AND CHEMO IN [place]  . Hypertension   . Hypogonadism in male   . Incomplete right bundle branch block   . 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 Nocturia   . OSA on CPAP   . Persistent atrial  fibrillation ([place])       . Primary prostate adenocarcinoma ([place]) DX 07/08/14   Gleason 7,  stage T1c  . PVC's (premature ventricular contractions) 11/21/2015  . 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 Weakness of left leg    SECONDARY TO SURGICAL NERVE INJURY OF LEFT GOIN  . Wears glasses    Past Surgical History:  Procedure Laterality Date  . ATRIAL FIBRILLATION ABLATION N/A 06/08/2018   Procedure: ATRIAL FIBRILLATION ABLATION;  Surgeon: Thompson Grayer, MD;  Location: [place] [facility];  Service: Cardiovascular;  Laterality: N/A;  . ATRIAL FIBRILLATION ABLATION N/A 12/20/2018   Procedure: ATRIAL FIBRILLATION ABLATION;  Surgeon: Thompson Grayer, MD;  Location: [place] [facility];  Service: Cardiovascular;  Laterality: N/A;  . CARDIAC CATHETERIZATION  08-12-2003  dr Tressia Miners turner   Normal coronary arteries, normal wall motion, no sig. abnormalities  . CARDIOVERSION N/A 08/04/2015   Procedure: CARDIOVERSION;  Surgeon: [name], MD;  Location: [place];  Service: Cardiovascular;  Laterality: N/A;  . CARDIOVERSION N/A 10/28/2017   Procedure: CARDIOVERSION;  Surgeon: Larey Dresser, MD;  Location: [facility] ENDOSCOPY;  Service: Cardiovascular;  Laterality: N/A;  . CARDIOVERSION N/A 04/12/2018   Procedure: CARDIOVERSION;  Surgeon: Thayer Headings, MD;  Location: [place];  Service: Cardiovascular;  Laterality: N/A;  .  CARDIOVERSION N/A 07/26/2018   Procedure: CARDIOVERSION;  Surgeon: Larey Dresser, MD;  Location: [facility] ENDOSCOPY;  Service: Cardiovascular;  Laterality: N/A;  . CARDIOVERSION N/A 10/09/2018   Procedure: CARDIOVERSION;  Surgeon: Sueanne Margarita, MD;  Location: [facility] ENDOSCOPY;  Service: Cardiovascular;  Laterality: N/A;  . COLONOSCOPY  07-03-2002  . [place]  . FRACTURE SURGERY    . GROIN DISSECTION Left 1992   [place]   SARCOMA SURGERY  . INGUINAL HERNIA REPAIR Right 1950s?  . INTESTINAL BYPASS  1976   GASTRIC FOR OBESITY  . JOINT REPLACEMENT      . PATELLA FRACTURE SURGERY Left ~ 1995   "broke it twice; only had OR once"  . PROSTATE BIOPSY  06/2014  . RADIOACTIVE SEED IMPLANT N/A 10/17/2014   Procedure: RADIOACTIVE SEED IMPLANT    ;  Surgeon: Ailene Rud, MD;  Location: [facility];  Service: Urology;  Laterality: N/A;   68 SEEDS IMPLANTED   . TEE WITHOUT CARDIOVERSION N/A 10/28/2017   Procedure: TRANSESOPHAGEAL ECHOCARDIOGRAM (TEE);  Surgeon: Larey Dresser, MD;  Location: [facility] ENDOSCOPY;  Service: Cardiovascular;  Laterality: N/A;  . TONSILLECTOMY  1950s  . TOTAL KNEE ARTHROPLASTY Right 12/17/2014   Procedure: TOTAL KNEE ARTHROPLASTY;  Surgeon: Melrose Nakayama, MD;  Location: [place];  Service: Orthopedics;  Laterality: Right;  . TRANSTHORACIC ECHOCARDIOGRAM  08-08-2003   moderate LVH/  ef 55-65%/  mild MR/  moderate LAE/  trivial TR/  trivial pericardial effusion posterior to the heart  . [place]  . VIDEO BRONCHOSCOPY Bilateral 09/21/2016   Procedure: VIDEO BRONCHOSCOPY WITHOUT FLUORO;  Surgeon: Collene Gobble, MD;  Location: [place];  Service: Cardiopulmonary;  Laterality: Bilateral;    Current Outpatient Medications  Medication Sig Dispense Refill  . acetaminophen (TYLENOL) 500 MG tablet Take 1,000 mg by mouth every 6 (six) hours as needed for moderate pain or headache.    . albuterol (PROVENTIL HFA;VENTOLIN HFA) 108 (90 Base) MCG/ACT inhaler Inhale 2 puffs into the lungs as needed for wheezing or shortness of breath.     [name] amiodarone (PACERONE) 200 MG tablet Take 1 tablet (200 mg total) by mouth daily.    . bumetanide (BUMEX) 2 MG tablet Take 2 mg by mouth daily.    [name] buPROPion (WELLBUTRIN XL) 300 MG 24 hr tablet Take 300 mg by mouth daily.     . Cholecalciferol (VITAMIN D3) 125 MCG (5000 UT) TABS Take 5,000 Units by mouth daily.     . clotrimazole (LOTRIMIN) 1 % cream Apply 1 application topically as needed (itching).     . cyanocobalamin (,VITAMIN B-12,) 1000 MCG/ML injection Inject  1,000 mcg into the muscle every 30 (thirty) days.    [name] diltiazem (CARDIZEM CD) 180 MG 24 hr capsule Take 180 mg by mouth daily.    [name] ELIQUIS 5 MG TABS tablet TAKE ONE TABLET BY MOUTH TWICE DAILY 180 tablet 1  . escitalopram (LEXAPRO) 10 MG tablet Take 10 mg by mouth daily.    . magnesium oxide (MAG-OX) 400 MG tablet TAKE 2 TABLETS EACH MORNING AND 1 TABLETEACH EVENING. 270 tablet 3  . metoprolol tartrate (LOPRESSOR) 25 MG tablet Take 25 mg by mouth 2 (two) times daily.    . metoprolol tartrate (LOPRESSOR) 50 MG tablet TAKE ONE TABLET BY MOUTH TWICE DAILY 180 tablet 3  . Multiple Vitamin (MULTIVITAMIN WITH MINERALS) TABS tablet Take 1 tablet by mouth daily.    . Multiple Vitamins-Minerals (PRESERVISION  AREDS 2 PO) Take 1 tablet by mouth 2 (two) times a day.    [name] Glycol-Propyl Glycol (SYSTANE ULTRA) 0.4-0.3 % SOLN Place 1 drop into both eyes as needed (for dry eyes).     . potassium chloride SA (KLOR-CON) 20 MEQ tablet Take 1 tablet (20 mEq total) by mouth 2 (two) times daily. 180 tablet 3  . tamsulosin (FLOMAX) 0.4 MG CAPS capsule Take 0.8 mg by mouth at bedtime.     . Coenzyme Q10 300 MG CAPS Take 300 mg by mouth at bedtime.     No current facility-administered medications for this encounter.    Allergies  Allergen Reactions  . Ace Inhibitors Cough  . Xarelto [Rivaroxaban] Other (See Comments)    Joint pain    Social History   Socioeconomic History  . Marital status: Married    Spouse name: Not on file  . Number of children: Not on file  . Years of education: Not on file  . Highest education level: Not on file  Occupational History  . Not on file  Tobacco Use  . Smoking status: Never Smoker  . Smokeless tobacco: Never Used  Substance and Sexual Activity  . Alcohol use: No  . Drug use: No  . Sexual activity: Not Currently  Other Topics Concern  . 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 Difficulty of Paying Living Expenses:   Food Insecurity:   . Worried About Charity fundraiser in the Last Year:   . Arboriculturist in the Last Year:   Transportation Needs:   . Film/video editor (Medical):   [name] Lack of Transportation (Non-Medical):   Physical Activity:   . Days of Exercise per Week:   . Minutes of Exercise per Session:   Stress:   . Feeling of Stress :   Social Connections:   . Frequency of Communication with Friends and Family:   . Frequency of Social Gatherings with Friends and Family:   . Attends Religious Services:   . Active Member of Clubs or Organizations:   . Attends Archivist Meetings:   [name] Marital Status:   Intimate Partner Violence:   . Fear of Current or Ex-Partner:   . Emotionally Abused:   [name] Physically Abused:   . Sexually Abused:     Family History  Problem Relation Age of Onset  . Liver disease Mother   . Renal Disease Mother   . Stroke Mother   . Atrial fibrillation Father   . Cancer Sister     ROS- All systems are reviewed and negative except as per the HPI above  Physical Exam: Vitals:   12/17/19 1437  BP: 118/62  Pulse: 80  Weight: 131.9 kg  Height: 5\' 10"  (1.778 m)   Wt Readings from Last 3 Encounters:  12/17/19 131.9 kg  11/19/19 127.9 kg  08/20/19 132.5 kg    Labs: Lab Results  Component Value Date   NA 143 12/18/2018   K 5.1 12/18/2018   CL 105 12/18/2018   CO2 23 12/18/2018   GLUCOSE 105 (H) 12/18/2018   BUN 22 12/18/2018   CREATININE 1.00 12/18/2018   CALCIUM 8.8 12/18/2018   MG 1.5 (L) 05/09/2018   Lab Results  Component Value Date   INR 1.32 08/03/2015  Lab Results  Component Value Date   CHOL 167 05/09/2018   HDL 46 05/09/2018   LDLCALC 88 05/09/2018   TRIG 164 (H) 05/09/2018     GEN- The patient is well appearing, alert and oriented x 3 today.   Head- normocephalic, atraumatic Eyes-  Sclera  clear, conjunctiva pink Ears- hearing intact Oropharynx- clear Neck- supple, no JVP Lymph- no cervical lymphadenopathy Lungs- Clear to ausculation bilaterally, normal work of breathing Heart- Regular rate and rhythm, no murmurs, rubs or gallops, PMI not laterally displaced GI- soft, NT, ND, + BS Extremities- no clubbing, cyanosis, or edema MS- no significant deformity or atrophy Skin- no rash or lesion Psych- euthymic mood, full affect Neuro- strength and sensation are intact  EKG-NSR at 80 bpm, pr int 176 ms, qrs int 80 ms, qtc 456 ms Epic records reviewed   Assessment and Plan: 1. Paroxysmal afib  Ablations x 2  Now loaded back on amiodarone Continue  200 mg daily   He feels slightly improved but still has runs of fast HR and fatigue/shortness of breath Tsh/cmet today Will schedule PFT's, covid testing required Has had both covid shots last February   2. HTN Stable   3. CHF  Weight stable  Controls edema with Bumex 2 mg daily   4. CHA2DS2VASc of at least 5 Continue  eliquis 5 mg bid    Geroge Baseman. Mila Homer [facility] [address] [place], [place] [address] [phone]    I will see back in 3 months

## 2019-12-17 NOTE — Patient Instructions (Signed)
PFT (Lung function test) has been ordered. Their department will call you to setup.

## 2019-12-18 ENCOUNTER — Encounter (HOSPITAL_COMMUNITY): Payer: Self-pay

## 2019-12-19 ENCOUNTER — Other Ambulatory Visit (HOSPITAL_COMMUNITY): Payer: Self-pay | Admitting: *Deleted

## 2019-12-19 ENCOUNTER — Encounter (HOSPITAL_COMMUNITY): Payer: Self-pay

## 2019-12-28 ENCOUNTER — Other Ambulatory Visit (HOSPITAL_COMMUNITY)
Admission: RE | Admit: 2019-12-28 | Discharge: 2019-12-28 | Disposition: A | Discharge status: Home / Self Care | Payer: Medicare Other | Source: Ambulatory Visit | Attending: Nurse Practitioner | Admitting: Nurse Practitioner

## 2019-12-28 DIAGNOSIS — Z01812 Encounter for preprocedural laboratory examination: Secondary | ICD-10-CM | POA: Insufficient documentation

## 2019-12-28 DIAGNOSIS — Z20822 Contact with and (suspected) exposure to covid-19: Secondary | ICD-10-CM | POA: Diagnosis not present

## 2019-12-28 LAB — SARS CORONAVIRUS 2 (TAT 6-24 HRS): SARS Coronavirus 2: NEGATIVE

## 2019-12-31 ENCOUNTER — Ambulatory Visit (HOSPITAL_COMMUNITY)
Admission: RE | Admit: 2019-12-31 | Discharge: 2019-12-31 | Disposition: A | Discharge status: Home / Self Care | Payer: Medicare Other | Source: Ambulatory Visit | Attending: Nurse Practitioner | Admitting: Nurse Practitioner

## 2019-12-31 ENCOUNTER — Other Ambulatory Visit: Payer: Self-pay

## 2019-12-31 DIAGNOSIS — I4819 Other persistent atrial fibrillation: Secondary | ICD-10-CM | POA: Diagnosis not present

## 2019-12-31 DIAGNOSIS — J988 Other specified respiratory disorders: Secondary | ICD-10-CM | POA: Diagnosis not present

## 2019-12-31 LAB — PULMONARY FUNCTION TEST
DL/VA % pred: 109 %
DL/VA: 4.38 ml/min/mmHg/L
DLCO unc % pred: 69 %
DLCO unc: 16.35 ml/min/mmHg
FEF 25-75 Post: 1.37 L/sec
FEF 25-75 Pre: 1.2 L/sec
FEF2575-%Change-Post: 14 %
FEF2575-%Pred-Post: 68 %
FEF2575-%Pred-Pre: 60 %
FEV1-%Change-Post: 4 %
FEV1-%Pred-Post: 62 %
FEV1-%Pred-Pre: 59 %
FEV1-Post: 1.74 L
FEV1-Pre: 1.66 L
FEV1FVC-%Change-Post: 6 %
FEV1FVC-%Pred-Pre: 100 %
FEV6-%Change-Post: 0 %
FEV6-%Pred-Post: 61 %
FEV6-%Pred-Pre: 61 %
FEV6-Post: 2.22 L
FEV6-Pre: 2.23 L
FEV6FVC-%Change-Post: 2 %
FEV6FVC-%Pred-Post: 107 %
FEV6FVC-%Pred-Pre: 104 %
FVC-%Change-Post: -1 %
FVC-%Pred-Post: 58 %
FVC-%Pred-Pre: 59 %
FVC-Post: 2.24 L
FVC-Pre: 2.28 L
Post FEV1/FVC ratio: 77 %
Post FEV6/FVC ratio: 100 %
Pre FEV1/FVC ratio: 73 %
Pre FEV6/FVC Ratio: 98 %
RV % pred: 109 %
RV: 2.69 L
TLC % pred: 73 %
TLC: 4.91 L

## 2019-12-31 MED ORDER — ALBUTEROL SULFATE (2.5 MG/3ML) 0.083% IN NEBU
2.5000 mg | INHALATION_SOLUTION | Freq: Once | RESPIRATORY_TRACT | Status: AC
  Administered 2019-12-31: 2.5 mg via RESPIRATORY_TRACT

## 2020-01-21 ENCOUNTER — Ambulatory Visit (INDEPENDENT_AMBULATORY_CARE_PROVIDER_SITE_OTHER): Payer: Medicare Other

## 2020-01-21 ENCOUNTER — Ambulatory Visit (INDEPENDENT_AMBULATORY_CARE_PROVIDER_SITE_OTHER): Payer: Medicare Other | Admitting: Emergency Medicine

## 2020-01-21 ENCOUNTER — Encounter: Payer: Self-pay | Admitting: Emergency Medicine

## 2020-01-21 ENCOUNTER — Other Ambulatory Visit: Payer: Self-pay

## 2020-01-21 VITALS — BP 116/68 | HR 77 | Temp 98.0°F | Ht 70.0 in | Wt 285.6 lb

## 2020-01-21 DIAGNOSIS — R0600 Dyspnea, unspecified: Secondary | ICD-10-CM

## 2020-01-21 DIAGNOSIS — G4733 Obstructive sleep apnea (adult) (pediatric): Secondary | ICD-10-CM

## 2020-01-21 DIAGNOSIS — J45909 Unspecified asthma, uncomplicated: Secondary | ICD-10-CM

## 2020-01-21 DIAGNOSIS — G4730 Sleep apnea, unspecified: Secondary | ICD-10-CM | POA: Diagnosis not present

## 2020-01-21 MED ORDER — SPIRIVA RESPIMAT 2.5 MCG/ACT IN AERS
2.0000 | INHALATION_SPRAY | Freq: Every day | RESPIRATORY_TRACT | 5 refills | Status: DC
Start: 2020-01-21 — End: 2020-02-12

## 2020-01-21 NOTE — Progress Notes (Signed)
Subjective:    Patient ID: Eric Mayer, male    DOB: June 13, 1941, 79 y.o.   MRN: [id number]  HPI 79 yo never smoker with A fib on amiodarone since 2 months ago, HTN + chronic diastolic CHF, prostate CA, remote sarcoma s/p excision and XRT, OSA on CPAP reliably, hx gastric bypass.   I have followed him for his obstructive sleep apnea and also for dyspnea.  He had pulmonary function testing in January 2018 that showed severe obstruction with normal lung volumes.  Interestingly his flow volume loop suggestive of fixed intrathoracic obstruction.  For this reason we undertook a bronchoscopy in February 2018 but there was no evidence for an airways abnormality.  He has also had some upper airway instability, hoarseness, etc.  He reports that since last time he is doing fairly well, but he has had some intermittent dyspnea associated with hypotension and his A fib. He has some persistent exertional dyspnea, remains on amiodarone. Last time we stopped the Symbicort, he doesn't believe that he has missed it. Still has albuterol available, uses occasionally, may be helpful.   CPAP compliance has been great, have his download data here today. Equipment in good repair. He feels that he is benefiting well. He has some daytime sleepiness. He does nap during the day.  He sleeps through the night. Fatigue and dyspnea still hold him back. He is about to restart his exercise routine, has a pool. [employer] wt 270, has ben considerably above this lately, adjusting diuretics.   ROV 01/21/20 --follow-up visit 79 year old never smoker with history of hypertension and chronic diastolic CHF, prostate cancer, obesity with a history of gastric bypass, A. fib, OSA on CPAP.  He is undergone bronchoscopy in the past for suggestion of a fixed intrathoracic obstruction on spirometry.  No large airway obstruction was seen. I have a CPAP compliance report that shows that he has great compliance 05/2019, has used his CPAP for 93% of  the nights over the month.  CPAP set on 8 cmH2O pressure.  He weighs 285 pounds. He reports good compliance, still feels that he is benefiting some. No snoring. He does nap during the day most days. He feels rested in the am - he gets about 6-8 hours a night.   He is having more exertional SOB, has to stop after walking through the house, short distance. He gets around using a cart  MDM: PFT done 12/31/19 reviewed by me show mixed restriction and obstruction. Restricted TLC and decreased diffusion that corrects for [facility] Reviewed cardiology note from 11/19/2019  Review of Systems  Constitutional: Negative.  Negative for fever and unexpected weight change.  HENT: Positive for rhinorrhea. Negative for congestion, dental problem, ear pain, nosebleeds, postnasal drip, sinus pressure, sneezing, sore throat and trouble swallowing.   Eyes: Negative.  Negative for redness and itching.  Respiratory: Positive for cough and shortness of breath. Negative for chest tightness and wheezing.   Cardiovascular: Positive for leg swelling. Negative for palpitations.  Gastrointestinal: Negative.  Negative for nausea and vomiting.  Genitourinary: Negative.  Negative for dysuria.  Musculoskeletal: Negative.  Negative for joint swelling.  Skin: Negative.  Negative for rash.  Neurological: Negative.  Negative for headaches.  Hematological: Negative.  Does not bruise/bleed easily.  Psychiatric/Behavioral: Negative for dysphoric mood. The patient is not nervous/anxious.     Past Medical History:  Diagnosis Date  . Anemia 1980s X 1  . Arthritis    "hands, knees, hips, ankles" (07/31/2015)  . Atrial flutter (  [place]) 07/31/2015  . Chronic diastolic heart failure ([place])    a. 11/2015: Echo w/ EF of 60-65%, no WMA, Grade 2 DD, trivial AR, ascending aorta mildly dilated.   . Depression   . History of cardiovascular stress test    a. 03/2015: Dobutamine Stress Echo with no evidence of ischemia.   [name] History of gastric bypass     . History of peptic ulcer    1980's  . History of radiation therapy 1993   sarcoma of left groin, tx at [facility]  . History of sarcoma    1993  LEFT GOIN--  S/P SURGERY, RADIATION AND CHEMO IN [place]  . Hypertension   . Hypogonadism in male   . Incomplete right bundle branch block   . 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 Nocturia   . OSA on CPAP   . Persistent atrial fibrillation ([place])       . Primary prostate adenocarcinoma ([place]) DX 07/08/14   Gleason 7,  stage T1c  . PVC's (premature ventricular contractions) 11/21/2015  . 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 Weakness of left leg    SECONDARY TO SURGICAL NERVE INJURY OF LEFT GOIN  . Wears glasses      Family History  Problem Relation Age of Onset  . Liver disease Mother   . Renal Disease Mother   . Stroke Mother   . Atrial fibrillation Father   . Cancer Sister      Social History   Socioeconomic History  . Marital status: Married    Spouse name: Not on file  . Number of children: Not on file  . Years of education: Not on file  . Highest education level: Not on file  Occupational History  . Not on file  Tobacco Use  . Smoking status: Never Smoker  . Smokeless tobacco: Never Used  Substance and Sexual Activity  . Alcohol use: No  . Drug use: No  . Sexual activity: Not Currently  Other Topics Concern  . 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 Difficulty of Paying Living Expenses:   Food Insecurity:   . Worried About Charity fundraiser in the Last Year:   . Arboriculturist in the Last Year:   Transportation Needs:   . Film/video editor (Medical):   [name] Lack of Transportation (Non-Medical):   Physical Activity:   . Days of Exercise per Week:   .  Minutes of Exercise per Session:   Stress:   . Feeling of Stress :   Social Connections:   . Frequency of Communication with Friends and Family:   . Frequency of Social Gatherings with Friends and Family:   . Attends Religious Services:   . Active Member of Clubs or Organizations:   . Attends Archivist Meetings:   [name] Marital Status:   Intimate Partner Violence:   . Fear of Current or Ex-Partner:   . Emotionally Abused:   [name] Physically Abused:   . 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  lived in [place], [place]; has lived around [place] Reactions  . Ace Inhibitors Cough  . Xarelto [Rivaroxaban] Other (See Comments)    Joint pain           Objective:   Physical Exam Vitals:   01/21/20 0958  BP: 116/68  Pulse: 77  Temp: 98 F (36.7 C)  TempSrc: Oral  SpO2: 95%  Weight: 285 lb 9.6 oz (129.5 kg)  Height: 5\' 10"  (1.778 m)   Gen: Pleasant, overwt man, in no distress,  normal affect  ENT: No lesions,  mouth clear,  oropharynx clear, no postnasal drip  Neck: No JVD, no stridor  Lungs: No use of accessory muscles, clear, small breaths  Cardiovascular: RRR, heart sounds normal, no murmur or gallops, trace peripheral edema  Musculoskeletal: well-healed scars on his knees, in a wheelchair   Neuro: alert, non focal  Skin: Warm, no lesions or rashes     Assessment & Plan:  Intrinsic asthma Multifactorial exertional dyspnea, obesity and presumed fixed asthma.  He still has some exertional dyspnea, question whether some of this is deconditioning.  Pulmonary function testing repeated on 12/31/2019 did confirm obstruction (mixed disease).  I think we should do an empiric trial of Spiriva, see if he gets any benefit.  He has failed Symbicort in the past.  He may benefit from pulmonary rehab  improving his cardiopulmonary conditioning as well.  We talked about this.  He is going to check and see what resources are available to him at his assisted living facility.  We may decide to make a formal referral to [facility] at some point going forward.  Walking oximetry today to rule out occult desaturation  Sleep apnea-on C-Pap Reliable with his CPAP but he still naps during the day.  Given his dyspnea, napping I think it would be reasonable to repeat his CPAP titration study ensure that 8 cm water pressure is adequate.  He agrees.  Baltazar Apo, MD, PhD 01/21/2020, 4:02 PM [facility] [phone] or if no answer [phone]

## 2020-01-21 NOTE — Assessment & Plan Note (Addendum)
Multifactorial exertional dyspnea, obesity and presumed fixed asthma.  He still has some exertional dyspnea, question whether some of this is deconditioning.  Pulmonary function testing repeated on 12/31/2019 did confirm obstruction (mixed disease).  I think we should do an empiric trial of Spiriva, see if he gets any benefit.  He has failed Symbicort in the past.  He may benefit from pulmonary rehab improving his cardiopulmonary conditioning as well.  We talked about this.  He is going to check and see what resources are available to him at his assisted living facility.  We may decide to make a formal referral to [facility] at some point going forward.  Walking oximetry today to rule out occult desaturation

## 2020-01-21 NOTE — Patient Instructions (Addendum)
CXR today  We will repeat a CPAP titration study Please start Spiriva Respimat 2 puffs once a day.  If this medication helps you then we will probably write a prescription for going forward. Walking oximetry on room air today. You would probably benefit from pulmonary rehabilitation or initiation of of an exercise regimen.  Find out what options are available at your home.  We can review next time and decide how to proceed. Follow with Dr Lamonte Sakai next available after your sleep study to review

## 2020-01-21 NOTE — Assessment & Plan Note (Signed)
Reliable with his CPAP but he still naps during the day.  Given his dyspnea, napping I think it would be reasonable to repeat his CPAP titration study ensure that 8 cm water pressure is adequate.  He agrees.

## 2020-01-31 NOTE — Telephone Encounter (Signed)
Received the following message from patient:   "Should I stop taking amiodarone?  Call from Roderic Palau about this.  Also is there a Pulmonary rehab at [facility] I would benefit from with my rapid heart beat and shortness of breath? What is the procedure to get PT to help get back in shape physically.  Problem seems to be multi-faceted with physical shape and weight and afib."   I called [facility] to see if they offer any service related to pulmonary rehab. The receptionist stated that she would need to check with the nursing staff and give our office a call back.   RB, can you please advise on the amiodarone?

## 2020-01-31 NOTE — Telephone Encounter (Signed)
We will need to discuss with Roderic Palau and coordinate. Since he is in permanent A fib, there may not be a true indication to continue amiodarone. She may be able to use different medication to keep his HR under control.   With regard to the Rehab - I would be fine if he wants to be referred for Pulmonary rehab at [facility]. Have him let us know how to make the referral

## 2020-02-01 ENCOUNTER — Other Ambulatory Visit: Payer: Self-pay | Admitting: *Deleted

## 2020-02-01 ENCOUNTER — Telehealth: Payer: Self-pay | Admitting: Emergency Medicine

## 2020-02-01 DIAGNOSIS — J45909 Unspecified asthma, uncomplicated: Secondary | ICD-10-CM

## 2020-02-01 NOTE — Telephone Encounter (Signed)
I think this has been addressed in email message

## 2020-02-01 NOTE — Telephone Encounter (Signed)
There is no reason for me to recommend that Eric Mayer stop amiodarone. He is tolerating it from pulmonary perspective.

## 2020-02-01 NOTE — Telephone Encounter (Signed)
I spoke with Roderic Palau NP at [facility] clinic.  I received clarification that she is asking if the patient can continue amiodarone?  Not discontinue amiodarone.  Please advise.

## 2020-02-01 NOTE — Telephone Encounter (Signed)
Spoke with Roderic Palau NP at A-Fib clinic and received clarification that she is not asking if patient can stop the amiodarone, but is it ok if the patient stops it from a pulmonary perspective.  Please advise.

## 2020-02-04 ENCOUNTER — Other Ambulatory Visit: Payer: Self-pay | Admitting: Cardiovascular Disease

## 2020-02-05 ENCOUNTER — Encounter (HOSPITAL_COMMUNITY): Payer: Self-pay

## 2020-02-05 ENCOUNTER — Other Ambulatory Visit (HOSPITAL_COMMUNITY)
Admission: RE | Admit: 2020-02-05 | Discharge: 2020-02-05 | Disposition: A | Discharge status: Home / Self Care | Payer: Medicare Other | Source: Ambulatory Visit | Attending: Pulmonary Disease | Admitting: Pulmonary Disease

## 2020-02-05 DIAGNOSIS — Z01812 Encounter for preprocedural laboratory examination: Secondary | ICD-10-CM | POA: Diagnosis present

## 2020-02-05 DIAGNOSIS — Z20822 Contact with and (suspected) exposure to covid-19: Secondary | ICD-10-CM | POA: Insufficient documentation

## 2020-02-05 LAB — SARS CORONAVIRUS 2 (TAT 6-24 HRS): SARS Coronavirus 2: NEGATIVE

## 2020-02-07 ENCOUNTER — Ambulatory Visit (HOSPITAL_BASED_OUTPATIENT_CLINIC_OR_DEPARTMENT_OTHER): Payer: Medicare Other | Attending: Emergency Medicine | Admitting: Pulmonary Disease

## 2020-02-07 ENCOUNTER — Other Ambulatory Visit: Payer: Self-pay

## 2020-02-07 ENCOUNTER — Telehealth: Payer: Self-pay | Admitting: Emergency Medicine

## 2020-02-07 DIAGNOSIS — G4733 Obstructive sleep apnea (adult) (pediatric): Secondary | ICD-10-CM | POA: Diagnosis not present

## 2020-02-07 DIAGNOSIS — J45909 Unspecified asthma, uncomplicated: Secondary | ICD-10-CM

## 2020-02-07 DIAGNOSIS — G4761 Periodic limb movement disorder: Secondary | ICD-10-CM | POA: Diagnosis not present

## 2020-02-07 NOTE — Telephone Encounter (Signed)
Yes okay to send this order

## 2020-02-07 NOTE — Telephone Encounter (Signed)
Dr. Lamonte Sakai, are you ok with sending order to [facility] for occupational therapy?

## 2020-02-08 ENCOUNTER — Telehealth (HOSPITAL_COMMUNITY): Payer: Self-pay | Admitting: Nurse Practitioner

## 2020-02-08 ENCOUNTER — Other Ambulatory Visit: Payer: Self-pay | Admitting: *Deleted

## 2020-02-08 NOTE — Telephone Encounter (Signed)
Order for OT placed in Epic and faxed to [facility] at [facility] at [phone].  Nothing further needed.

## 2020-02-08 NOTE — Telephone Encounter (Signed)
LMTCB x1 for [employer] at [facility].

## 2020-02-08 NOTE — Telephone Encounter (Signed)
Pt sent strip of rapid aflutter up to 150-160 bpm  and SR with afib salvos with RVR that pt recorded when he is up and ambulatory. He is status post  2 ablations and reloading of amiodarone after it was stopped prior to last ablation for not being effective. He wants to be more active to help lose weight but is very symptomatic when up. I discussed with Dr. Rayann Heman and he suggested a convergent procedure or a PPM with av nodal ablation . Pt wants to hear more about the later. I will set him up with appointment with Dr. Lovena Le to discuss further.

## 2020-02-08 NOTE — Telephone Encounter (Signed)
What diagnosis do you want [place] to use for the OT?

## 2020-02-08 NOTE — Telephone Encounter (Signed)
We can use asthma and/or restrictive lung disease.

## 2020-02-08 NOTE — Telephone Encounter (Signed)
Eric Mayer is returning phone call. Also needs written order signed by the Doctor. Phone number is [phone]. Fax number is [phone].

## 2020-02-12 ENCOUNTER — Other Ambulatory Visit: Payer: Self-pay

## 2020-02-12 ENCOUNTER — Encounter: Payer: Self-pay | Admitting: Internal Medicine

## 2020-02-12 ENCOUNTER — Ambulatory Visit (INDEPENDENT_AMBULATORY_CARE_PROVIDER_SITE_OTHER): Payer: Medicare Other | Admitting: Internal Medicine

## 2020-02-12 VITALS — BP 114/58 | HR 87 | Ht 70.0 in | Wt 288.8 lb

## 2020-02-12 DIAGNOSIS — I4819 Other persistent atrial fibrillation: Secondary | ICD-10-CM

## 2020-02-12 NOTE — H&P (View-Only) (Signed)
HPI Eric Mayer is referred today by Dr. Rayann Heman for evaluation of and consideration for PPM insertion and AV node ablation. He is a pleasant 79 yo man with a h/o morbid obesity, persistent atrial fib who has undergone 2 afib ablations. He has continued to have atrial fib and LA flutter with a RVR. Despite being on a beta blocker, calcium channel blocker and amiodarone, he still goes very fast in atrial fib/flutter. He has fatigue and weakness.   Allergies  Allergen Reactions  . Ace Inhibitors Cough  . Xarelto [Rivaroxaban] Other (See Comments)    Joint pain     Current Outpatient Medications  Medication Sig Dispense Refill  . acetaminophen (TYLENOL) 500 MG tablet Take 1,000 mg by mouth every 6 (six) hours as needed for moderate pain or headache.    . albuterol (PROVENTIL HFA;VENTOLIN HFA) 108 (90 Base) MCG/ACT inhaler Inhale 2 puffs into the lungs as needed for wheezing or shortness of breath.     [name] amiodarone (PACERONE) 200 MG tablet Take 1 tablet (200 mg total) by mouth daily.    . budesonide-formoterol (SYMBICORT) 160-4.5 MCG/ACT inhaler Inhale 2 puffs into the lungs as needed.    . bumetanide (BUMEX) 2 MG tablet TAKE 1 TABLET BY MOUTH DAILY AND AN EXTRA TABLET FOR WEIGHT GAIN OF 2 POUNDSDAILY OR 5 POUNDS WEEKLY 180 tablet 1  . buPROPion (WELLBUTRIN XL) 300 MG 24 hr tablet Take 300 mg by mouth daily.     . Cholecalciferol (VITAMIN D3) 125 MCG (5000 UT) TABS Take 5,000 Units by mouth daily.     . clotrimazole (LOTRIMIN) 1 % cream Apply 1 application topically as needed (itching).     . cyanocobalamin (,VITAMIN B-12,) 1000 MCG/ML injection Inject 1,000 mcg into the muscle every 30 (thirty) days.    [name] diltiazem (CARDIZEM CD) 180 MG 24 hr capsule Take 180 mg by mouth daily.    [name] ELIQUIS 5 MG TABS tablet TAKE ONE TABLET BY MOUTH TWICE DAILY 180 tablet 1  . escitalopram (LEXAPRO) 10 MG tablet Take 10 mg by mouth daily.    . magnesium oxide (MAG-OX) 400 MG tablet TAKE 2 TABLETS EACH  MORNING AND 1 TABLETEACH EVENING. 270 tablet 3  . metoprolol tartrate (LOPRESSOR) 25 MG tablet Take 25 mg by mouth 2 (two) times daily.    . metoprolol tartrate (LOPRESSOR) 50 MG tablet TAKE ONE TABLET BY MOUTH TWICE DAILY 180 tablet 3  . Multiple Vitamin (MULTIVITAMIN WITH MINERALS) TABS tablet Take 1 tablet by mouth daily.    . Multiple Vitamins-Minerals (PRESERVISION AREDS 2 PO) Take 1 tablet by mouth 2 (two) times a day.    Eric Mayer Glycol-Propyl Glycol (SYSTANE ULTRA) 0.4-0.3 % SOLN Place 1 drop into both eyes as needed (for dry eyes).     . potassium chloride SA (KLOR-CON) 20 MEQ tablet Take 1 tablet (20 mEq total) by mouth 2 (two) times daily. 180 tablet 3  . tamsulosin (FLOMAX) 0.4 MG CAPS capsule Take 0.8 mg by mouth at bedtime.      No current facility-administered medications for this visit.     Past Medical History:  Diagnosis Date  . Anemia 1980s X 1  . Arthritis    "hands, knees, hips, ankles" (07/31/2015)  . Atrial flutter ([place]) 07/31/2015  . Chronic diastolic heart failure ([place])    a. 11/2015: Echo w/ EF of 60-65%, no WMA, Grade 2 DD, trivial AR, ascending aorta mildly dilated.   . Depression   . History  of cardiovascular stress test    a. 03/2015: Dobutamine Stress Echo with no evidence of ischemia.   [name] History of gastric bypass   . History of peptic ulcer    1980's  . History of radiation therapy 1993   sarcoma of left groin, tx at [facility]  . History of sarcoma    1993  LEFT GOIN--  S/P SURGERY, RADIATION AND CHEMO IN [place]  . Hypertension   . Hypogonadism in male   . Incomplete right bundle branch block   . 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 Nocturia   . OSA on CPAP   . Persistent atrial fibrillation ([place])       . 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 PVC's (premature ventricular contractions) 11/21/2015  . 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 Weakness of left leg    SECONDARY TO SURGICAL NERVE INJURY OF LEFT GOIN  . Wears glasses     ROS:   All systems reviewed and negative except as noted in the HPI.   Past Surgical History:  Procedure Laterality Date  . ATRIAL FIBRILLATION ABLATION N/A 06/08/2018   Procedure: ATRIAL FIBRILLATION ABLATION;  Surgeon: Thompson Grayer, MD;  Location: [place] [facility];  Service: Cardiovascular;  Laterality: N/A;  . ATRIAL FIBRILLATION ABLATION N/A 12/20/2018   Procedure: ATRIAL FIBRILLATION ABLATION;  Surgeon: Thompson Grayer, MD;  Location: [place] [facility];  Service: Cardiovascular;  Laterality: N/A;  . CARDIAC CATHETERIZATION  08-12-2003  dr Tressia Miners turner   Normal coronary arteries, normal wall motion, no sig. abnormalities  . CARDIOVERSION N/A 08/04/2015   Procedure: CARDIOVERSION;  Surgeon: [name], MD;  Location: [place];  Service: Cardiovascular;  Laterality: N/A;  . CARDIOVERSION N/A 10/28/2017   Procedure: CARDIOVERSION;  Surgeon: Larey Dresser, MD;  Location: [facility] ENDOSCOPY;  Service: Cardiovascular;  Laterality: N/A;  . CARDIOVERSION N/A 04/12/2018   Procedure: CARDIOVERSION;  Surgeon: Thayer Headings, MD;  Location: [facility] ENDOSCOPY;  Service: Cardiovascular;  Laterality: N/A;  . CARDIOVERSION N/A 07/26/2018   Procedure: CARDIOVERSION;  Surgeon: Larey Dresser, MD;  Location: [facility] ENDOSCOPY;  Service: Cardiovascular;  Laterality: N/A;  . CARDIOVERSION N/A 10/09/2018   Procedure: CARDIOVERSION;  Surgeon: Sueanne Margarita, MD;  Location: [facility] ENDOSCOPY;  Service: Cardiovascular;  Laterality: N/A;  . COLONOSCOPY  07-03-2002  . [place]  . FRACTURE SURGERY    . GROIN DISSECTION Left 1992   [place]   SARCOMA SURGERY  . INGUINAL HERNIA REPAIR Right 1950s?  . INTESTINAL BYPASS  1976   GASTRIC FOR OBESITY  . JOINT REPLACEMENT    . PATELLA FRACTURE SURGERY Left ~ 1995   "broke it twice; only had OR once"  . PROSTATE BIOPSY   06/2014  . RADIOACTIVE SEED IMPLANT N/A 10/17/2014   Procedure: RADIOACTIVE SEED IMPLANT    ;  Surgeon: Ailene Rud, MD;  Location: [facility];  Service: Urology;  Laterality: N/A;   68 SEEDS IMPLANTED   . TEE WITHOUT CARDIOVERSION N/A 10/28/2017   Procedure: TRANSESOPHAGEAL ECHOCARDIOGRAM (TEE);  Surgeon: Larey Dresser, MD;  Location: [facility] ENDOSCOPY;  Service: Cardiovascular;  Laterality: N/A;  . TONSILLECTOMY  1950s  . TOTAL KNEE ARTHROPLASTY Right 12/17/2014   Procedure: TOTAL KNEE ARTHROPLASTY;  Surgeon: Melrose Nakayama, MD;  Location: [place];  Service: Orthopedics;  Laterality: Right;  . TRANSTHORACIC ECHOCARDIOGRAM  08-08-2003   moderate LVH/  ef 55-65%/  mild MR/  moderate LAE/  trivial TR/  trivial pericardial effusion posterior to the heart  . [place]  . VIDEO BRONCHOSCOPY Bilateral 09/21/2016   Procedure: VIDEO BRONCHOSCOPY WITHOUT FLUORO;  Surgeon: Collene Gobble, MD;  Location: [place];  Service: Cardiopulmonary;  Laterality: Bilateral;     Family History  Problem Relation Age of Onset  . Liver disease Mother   . Renal Disease Mother   . Stroke Mother   . Atrial fibrillation Father   . Cancer Sister      Social History   Socioeconomic History  . Marital status: Married    Spouse name: Not on file  . Number of children: Not on file  . Years of education: Not on file  . Highest education level: Not on file  Occupational History  . Not on file  Tobacco Use  . Smoking status: Never Smoker  . Smokeless tobacco: Never Used  Vaping Use  . Vaping Use: Never used  Substance and Sexual Activity  . Alcohol use: No  . Drug use: No  . Sexual activity: Not Currently  Other Topics Concern  . 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 Difficulty of Paying Living Expenses:     Food Insecurity:   . Worried About Charity fundraiser in the Last Year:   . Arboriculturist in the Last Year:   Transportation Needs:   . Film/video editor (Medical):   [name] Lack of Transportation (Non-Medical):   Physical Activity:   . Days of Exercise per Week:   . Minutes of Exercise per Session:   Stress:   . Feeling of Stress :   Social Connections:   . Frequency of Communication with Friends and Family:   . Frequency of Social Gatherings with Friends and Family:   . Attends Religious Services:   . Active Member of Clubs or Organizations:   . Attends Archivist Meetings:   [name] Marital Status:   Intimate Partner Violence:   . Fear of Current or Ex-Partner:   . Emotionally Abused:   [name] Physically Abused:   . Sexually Abused:      BP (!) 114/58   Pulse 87   Ht 5\' 10"  (1.778 m)   Wt 288 lb 12.8 oz (131 kg)   SpO2 94%   BMI 41.44 kg/m   Physical Exam:  Well appearing NAD HEENT: Unremarkable Neck:  No JVD, no thyromegally Lymphatics:  No adenopathy Back:  No CVA tenderness Lungs:  Clear with no wheezes HEART:  IRegular rate rhythm, no murmurs, no rubs, no clicks Abd:  soft, positive bowel sounds, no organomegally, no rebound, no guarding Ext:  2 plus pulses, no edema, no cyanosis, no clubbing Skin:  No rashes no nodules Neuro:  CN II through XII intact, motor grossly intact  EKG - atypical atrial flutter with a CVR   Assess/Plan: 1. Uncontrolled atrial fib/flutter - he notes that his HR goes up to 150-160 with any exertion.  2. Chronic diastolic heart failure - Hopefully with better rate control we can improve his symptoms.  3. Coags - he has had no bleeding 4. HTN - his bp is  controlled. He wll maintain a low sodiu diet. 5. Obesity - I discussed weight loss and we will work on this after we get his HR controlled.   Mikle Bosworth.D.

## 2020-02-12 NOTE — Progress Notes (Signed)
HPI Eric Mayer is referred today by Dr. Rayann Mayer for evaluation of and consideration for PPM insertion and AV node ablation. He is a pleasant 79 yo man with a h/o morbid obesity, persistent atrial fib who has undergone 2 afib ablations. He has continued to have atrial fib and LA flutter with a RVR. Despite being on a beta blocker, calcium channel blocker and amiodarone, he still goes very fast in atrial fib/flutter. He has fatigue and weakness.   Allergies  Allergen Reactions  . Ace Inhibitors Cough  . Xarelto [Rivaroxaban] Other (See Comments)    Joint pain     Current Outpatient Medications  Medication Sig Dispense Refill  . acetaminophen (TYLENOL) 500 MG tablet Take 1,000 mg by mouth every 6 (six) hours as needed for moderate pain or headache.    . albuterol (PROVENTIL HFA;VENTOLIN HFA) 108 (90 Base) MCG/ACT inhaler Inhale 2 puffs into the lungs as needed for wheezing or shortness of breath.     [name] amiodarone (PACERONE) 200 MG tablet Take 1 tablet (200 mg total) by mouth daily.    . budesonide-formoterol (SYMBICORT) 160-4.5 MCG/ACT inhaler Inhale 2 puffs into the lungs as needed.    . bumetanide (BUMEX) 2 MG tablet TAKE 1 TABLET BY MOUTH DAILY AND AN EXTRA TABLET FOR WEIGHT GAIN OF 2 POUNDSDAILY OR 5 POUNDS WEEKLY 180 tablet 1  . buPROPion (WELLBUTRIN XL) 300 MG 24 hr tablet Take 300 mg by mouth daily.     . Cholecalciferol (VITAMIN D3) 125 MCG (5000 UT) TABS Take 5,000 Units by mouth daily.     . clotrimazole (LOTRIMIN) 1 % cream Apply 1 application topically as needed (itching).     . cyanocobalamin (,VITAMIN B-12,) 1000 MCG/ML injection Inject 1,000 mcg into the muscle every 30 (thirty) days.    [name] diltiazem (CARDIZEM CD) 180 MG 24 hr capsule Take 180 mg by mouth daily.    [name] ELIQUIS 5 MG TABS tablet TAKE ONE TABLET BY MOUTH TWICE DAILY 180 tablet 1  . escitalopram (LEXAPRO) 10 MG tablet Take 10 mg by mouth daily.    . magnesium oxide (MAG-OX) 400 MG tablet TAKE 2 TABLETS EACH  MORNING AND 1 TABLETEACH EVENING. 270 tablet 3  . metoprolol tartrate (LOPRESSOR) 25 MG tablet Take 25 mg by mouth 2 (two) times daily.    . metoprolol tartrate (LOPRESSOR) 50 MG tablet TAKE ONE TABLET BY MOUTH TWICE DAILY 180 tablet 3  . Multiple Vitamin (MULTIVITAMIN WITH MINERALS) TABS tablet Take 1 tablet by mouth daily.    . Multiple Vitamins-Minerals (PRESERVISION AREDS 2 PO) Take 1 tablet by mouth 2 (two) times a day.    Eric Mayer (SYSTANE ULTRA) 0.4-0.3 % SOLN Place 1 drop into both eyes as needed (for dry eyes).     . potassium chloride SA (KLOR-CON) 20 MEQ tablet Take 1 tablet (20 mEq total) by mouth 2 (two) times daily. 180 tablet 3  . tamsulosin (FLOMAX) 0.4 MG CAPS capsule Take 0.8 mg by mouth at bedtime.      No current facility-administered medications for this visit.     Past Medical History:  Diagnosis Date  . Anemia 1980s X 1  . Arthritis    "hands, knees, hips, ankles" (07/31/2015)  . Atrial flutter ([place]) 07/31/2015  . Chronic diastolic heart failure ([place])    a. 11/2015: Echo w/ EF of 60-65%, no WMA, Grade 2 DD, trivial AR, ascending aorta mildly dilated.   . Depression   . History  of cardiovascular stress test    a. 03/2015: Dobutamine Stress Echo with no evidence of ischemia.   [name] History of gastric bypass   . History of peptic ulcer    1980's  . History of radiation therapy 1993   sarcoma of left groin, tx at [facility]  . History of sarcoma    1993  LEFT GOIN--  S/P SURGERY, RADIATION AND CHEMO IN [place]  . Hypertension   . Hypogonadism in male   . Incomplete right bundle branch block   . 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 Nocturia   . OSA on CPAP   . Persistent atrial fibrillation ([place])       . Primary prostate adenocarcinoma ([place]) DX 07/08/14   Gleason 7,  stage T1c  . PVC's (premature ventricular contractions) 11/21/2015  . 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 Weakness of left leg    SECONDARY TO SURGICAL NERVE INJURY OF LEFT GOIN  . Wears glasses     ROS:   All systems reviewed and negative except as noted in the HPI.   Past Surgical History:  Procedure Laterality Date  . ATRIAL FIBRILLATION ABLATION N/A 06/08/2018   Procedure: ATRIAL FIBRILLATION ABLATION;  Surgeon: Eric Grayer, MD;  Location: [place] [facility];  Service: Cardiovascular;  Laterality: N/A;  . ATRIAL FIBRILLATION ABLATION N/A 12/20/2018   Procedure: ATRIAL FIBRILLATION ABLATION;  Surgeon: Eric Grayer, MD;  Location: [place] [facility];  Service: Cardiovascular;  Laterality: N/A;  . CARDIAC CATHETERIZATION  08-12-2003  dr Eric Mayer turner   Normal coronary arteries, normal wall motion, no sig. abnormalities  . CARDIOVERSION N/A 08/04/2015   Procedure: CARDIOVERSION;  Surgeon: Eric Latch, MD;  Location: [place];  Service: Cardiovascular;  Laterality: N/A;  . CARDIOVERSION N/A 10/28/2017   Procedure: CARDIOVERSION;  Surgeon: Eric Dresser, MD;  Location: [facility] ENDOSCOPY;  Service: Cardiovascular;  Laterality: N/A;  . CARDIOVERSION N/A 04/12/2018   Procedure: CARDIOVERSION;  Surgeon: Eric Headings, MD;  Location: [facility] ENDOSCOPY;  Service: Cardiovascular;  Laterality: N/A;  . CARDIOVERSION N/A 07/26/2018   Procedure: CARDIOVERSION;  Surgeon: Eric Dresser, MD;  Location: [facility] ENDOSCOPY;  Service: Cardiovascular;  Laterality: N/A;  . CARDIOVERSION N/A 10/09/2018   Procedure: CARDIOVERSION;  Surgeon: Eric Margarita, MD;  Location: [facility] ENDOSCOPY;  Service: Cardiovascular;  Laterality: N/A;  . COLONOSCOPY  07-03-2002  . [place]  . FRACTURE SURGERY    . GROIN DISSECTION Left 1992   [place]   SARCOMA SURGERY  . INGUINAL HERNIA REPAIR Right 1950s?  . INTESTINAL BYPASS  1976   GASTRIC FOR OBESITY  . JOINT REPLACEMENT    . PATELLA FRACTURE SURGERY Left ~ 1995   "broke it twice; only had OR once"  . PROSTATE BIOPSY   06/2014  . RADIOACTIVE SEED IMPLANT N/A 10/17/2014   Procedure: RADIOACTIVE SEED IMPLANT    ;  Surgeon: Ailene Rud, MD;  Location: [facility];  Service: Urology;  Laterality: N/A;   68 SEEDS IMPLANTED   . TEE WITHOUT CARDIOVERSION N/A 10/28/2017   Procedure: TRANSESOPHAGEAL ECHOCARDIOGRAM (TEE);  Surgeon: Eric Dresser, MD;  Location: [facility] ENDOSCOPY;  Service: Cardiovascular;  Laterality: N/A;  . TONSILLECTOMY  1950s  . TOTAL KNEE ARTHROPLASTY Right 12/17/2014   Procedure: TOTAL KNEE ARTHROPLASTY;  Surgeon: Melrose Nakayama, MD;  Location: [place];  Service: Orthopedics;  Laterality: Right;  . TRANSTHORACIC ECHOCARDIOGRAM  08-08-2003   moderate LVH/  ef 55-65%/  mild MR/  moderate LAE/  trivial TR/  trivial pericardial effusion posterior to the heart  . [place]  . VIDEO BRONCHOSCOPY Bilateral 09/21/2016   Procedure: VIDEO BRONCHOSCOPY WITHOUT FLUORO;  Surgeon: Collene Gobble, MD;  Location: [place];  Service: Cardiopulmonary;  Laterality: Bilateral;     Family History  Problem Relation Age of Onset  . Liver disease Mother   . Renal Disease Mother   . Stroke Mother   . Atrial fibrillation Father   . Cancer Sister      Social History   Socioeconomic History  . Marital status: Married    Spouse name: Not on file  . Number of children: Not on file  . Years of education: Not on file  . Highest education level: Not on file  Occupational History  . Not on file  Tobacco Use  . Smoking status: Never Smoker  . Smokeless tobacco: Never Used  Vaping Use  . Vaping Use: Never used  Substance and Sexual Activity  . Alcohol use: No  . Drug use: No  . Sexual activity: Not Currently  Other Topics Concern  . 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 Difficulty of Paying Living Expenses:     Food Insecurity:   . Worried About Charity fundraiser in the Last Year:   . Arboriculturist in the Last Year:   Transportation Needs:   . Film/video editor (Medical):   [name] Lack of Transportation (Non-Medical):   Physical Activity:   . Days of Exercise per Week:   . Minutes of Exercise per Session:   Stress:   . Feeling of Stress :   Social Connections:   . Frequency of Communication with Friends and Family:   . Frequency of Social Gatherings with Friends and Family:   . Attends Religious Services:   . Active Member of Clubs or Organizations:   . Attends Archivist Meetings:   [name] Marital Status:   Intimate Partner Violence:   . Fear of Current or Ex-Partner:   . Emotionally Abused:   [name] Physically Abused:   . Sexually Abused:      BP (!) 114/58   Pulse 87   Ht 5\' 10"  (1.778 m)   Wt 288 lb 12.8 oz (131 kg)   SpO2 94%   BMI 41.44 kg/m   Physical Exam:  Well appearing NAD HEENT: Unremarkable Neck:  No JVD, no thyromegally Lymphatics:  No adenopathy Back:  No CVA tenderness Lungs:  Clear with no wheezes HEART:  IRegular rate rhythm, no murmurs, no rubs, no clicks Abd:  soft, positive bowel sounds, no organomegally, no rebound, no guarding Ext:  2 plus pulses, no edema, no cyanosis, no clubbing Skin:  No rashes no nodules Neuro:  CN II through XII intact, motor grossly intact  EKG - atypical atrial flutter with a CVR   Assess/Plan: 1. Uncontrolled atrial fib/flutter - he notes that his HR goes up to 150-160 with any exertion.  2. Chronic diastolic heart failure - Hopefully with better rate control we can improve his symptoms.  3. Coags - he has had no bleeding 4. HTN - his bp is  controlled. He wll maintain a low sodiu diet. 5. Obesity - I discussed weight loss and we will work on this after we get his HR controlled.   Mikle Bosworth.D.

## 2020-02-12 NOTE — Patient Instructions (Addendum)
Medication Instructions:  Your physician recommends that you continue on your current medications as directed. Please refer to the Current Medication list given to you today.  Labwork: You will get lab work today:  BMP and CBC  Testing/Procedures: None ordered.  Follow-Up:  SEE INSTRUCTION LETTER  Any Other Special Instructions Will Be Listed Below (If Applicable).  If you need a refill on your cardiac medications before your next appointment, please call your pharmacy.    Pacemaker Implantation, Adult Pacemaker implantation is a procedure to place a pacemaker inside your chest. A pacemaker is a small computer that sends electrical signals to the heart and helps your heart beat normally. A pacemaker also stores information about your heart rhythms. You may need pacemaker implantation if you:  Have a slow heartbeat (bradycardia).  Faint (syncope).  Have shortness of breath (dyspnea) due to heart problems. The pacemaker attaches to your heart through a wire, called a lead. Sometimes just one lead is needed. Other times, there will be two leads. There are two types of pacemakers:  Transvenous pacemaker. This type is placed under the skin or muscle of your chest. The lead goes through a vein in the chest area to reach the inside of the heart.  Epicardial pacemaker. This type is placed under the skin or muscle of your chest or belly. The lead goes through your chest to the outside of the heart. Tell a health care provider about:  Any allergies you have.  All medicines you are taking, including vitamins, herbs, eye drops, creams, and over-the-counter medicines.  Any problems you or family members have had with anesthetic medicines.  Any blood or bone disorders you have.  Any surgeries you have had.  Any medical conditions you have.  Whether you are pregnant or may be pregnant. What are the risks? Generally, this is a safe procedure. However, problems may occur,  including:  Infection.  Bleeding.  Failure of the pacemaker or the lead.  Collapse of a lung or bleeding into a lung.  Blood clot inside a blood vessel with a lead.  Damage to the heart.  Infection inside the heart (endocarditis).  Allergic reactions to medicines. What happens before the procedure? Staying hydrated Follow instructions from your health care provider about hydration, which may include:  Up to 2 hours before the procedure - you may continue to drink clear liquids, such as water, clear fruit juice, black coffee, and plain tea. Eating and drinking restrictions Follow instructions from your health care provider about eating and drinking, which may include:  8 hours before the procedure - stop eating heavy meals or foods such as meat, fried foods, or fatty foods.  6 hours before the procedure - stop eating light meals or foods, such as toast or cereal.  6 hours before the procedure - stop drinking milk or drinks that contain milk.  2 hours before the procedure - stop drinking clear liquids. Medicines  Ask your health care provider about: ? Changing or stopping your regular medicines. This is especially important if you are taking diabetes medicines or blood thinners. ? Taking medicines such as aspirin and ibuprofen. These medicines can thin your blood. Do not take these medicines before your procedure if your health care provider instructs you not to.  You may be given antibiotic medicine to help prevent infection. General instructions  You will have a heart evaluation. This may include an electrocardiogram (ECG), chest X-ray, and heart imaging (echocardiogram,  or echo) tests.  You will have   blood tests.  Do not use any products that contain nicotine or tobacco, such as cigarettes and e-cigarettes. If you need help quitting, ask your health care provider.  Plan to have someone take you home from the hospital or clinic.  If you will be going home right after  the procedure, plan to have someone with you for 24 hours.  Ask your health care provider how your surgical site will be marked or identified. What happens during the procedure?  To reduce your risk of infection: ? Your health care team will wash or sanitize their hands. ? Your skin will be washed with soap. ? Hair may be removed from the surgical area.  An IV tube will be inserted into one of your veins.  You will be given one or more of the following: ? A medicine to help you relax (sedative). ? A medicine to numb the area (local anesthetic). ? A medicine to make you fall asleep (general anesthetic).  If you are getting a transvenous pacemaker: ? An incision will be made in your upper chest. ? A pocket will be made for the pacemaker. It may be placed under the skin or between layers of muscle. ? The lead will be inserted into a blood vessel that returns to the heart. ? While X-rays are taken by an imaging machine (fluoroscopy), the lead will be advanced through the vein to the inside of your heart. ? The other end of the lead will be tunneled under the skin and attached to the pacemaker.  If you are getting an epicardial pacemaker: ? An incision will be made near your ribs or breastbone (sternum) for the lead. ? The lead will be attached to the outside of your heart. ? Another incision will be made in your chest or upper belly to create a pocket for the pacemaker. ? The free end of the lead will be tunneled under the skin and attached to the pacemaker.  The transvenous or epicardial pacemaker will be tested. Imaging studies may be done to check the lead position.  The incisions will be closed with stitches (sutures), adhesive strips, or skin glue.  Bandages (dressing) will be placed over the incisions. The procedure may vary among health care providers and hospitals. What happens after the procedure?  Your blood pressure, heart rate, breathing rate, and blood oxygen level will  be monitored until the medicines you were given have worn off.  You will be given antibiotics and pain medicine.  ECG and chest x-rays will be done.  You will wear a continuous type of ECG (Holter monitor) to check your heart rhythm.  Your health care provider will program the pacemaker.  Do not drive for 24 hours if you received a sedative. This information is not intended to replace advice given to you by your health care provider. Make sure you discuss any questions you have with your health care provider. Document Revised: 04/21/2018 Document Reviewed: 01/14/2016 Elsevier Patient Education  2020 [employer]  

## 2020-02-13 DIAGNOSIS — G4733 Obstructive sleep apnea (adult) (pediatric): Secondary | ICD-10-CM

## 2020-02-13 LAB — CBC WITH DIFFERENTIAL/PLATELET
Basophils Absolute: 0 10*3/uL (ref 0.0–0.2)
Basos: 1 %
EOS (ABSOLUTE): 0.2 10*3/uL (ref 0.0–0.4)
Eos: 3 %
Hematocrit: 35.2 % — ABNORMAL LOW (ref 37.5–51.0)
Hemoglobin: 10.5 g/dL — ABNORMAL LOW (ref 13.0–17.7)
Immature Grans (Abs): 0 10*3/uL (ref 0.0–0.1)
Immature Granulocytes: 0 %
Lymphocytes Absolute: 2 10*3/uL (ref 0.7–3.1)
Lymphs: 28 %
MCH: 25.9 pg — ABNORMAL LOW (ref 26.6–33.0)
MCHC: 29.8 g/dL — ABNORMAL LOW (ref 31.5–35.7)
MCV: 87 fL (ref 79–97)
Monocytes Absolute: 0.5 10*3/uL (ref 0.1–0.9)
Monocytes: 7 %
Neutrophils Absolute: 4.4 10*3/uL (ref 1.4–7.0)
Neutrophils: 61 %
Platelets: 219 10*3/uL (ref 150–450)
RBC: 4.05 x10E6/uL — ABNORMAL LOW (ref 4.14–5.80)
RDW: 16.7 % — ABNORMAL HIGH (ref 11.6–15.4)
WBC: 7.2 10*3/uL (ref 3.4–10.8)

## 2020-02-13 LAB — BASIC METABOLIC PANEL
BUN/Creatinine Ratio: 25 — ABNORMAL HIGH (ref 10–24)
BUN: 28 mg/dL — ABNORMAL HIGH (ref 8–27)
CO2: 23 mmol/L (ref 20–29)
Calcium: 8.7 mg/dL (ref 8.6–10.2)
Chloride: 108 mmol/L — ABNORMAL HIGH (ref 96–106)
Creatinine, Ser: 1.1 mg/dL (ref 0.76–1.27)
GFR calc Af Amer: 74 mL/min/{1.73_m2} (ref >59)
GFR calc non Af Amer: 64 mL/min/{1.73_m2} (ref >59)
Glucose: 104 mg/dL — ABNORMAL HIGH (ref 65–99)
Potassium: 4.6 mmol/L (ref 3.5–5.2)
Sodium: 143 mmol/L (ref 134–144)

## 2020-02-13 NOTE — Procedures (Signed)
    Patient Name: Eric Mayer, Eric Mayer Date: 02/07/2020 Gender: Male D.O.B: 30-Jan-1941 Age (years): 78 Referring Provider: Baltazar Apo Height (inches): 90 Interpreting Physician: Chesley Mires MD, ABSM Weight (lbs): 288 RPSGT: Jorge Ny BMI: 7 MRN: [id number] Neck Size: 16.75  CLINICAL INFORMATION 79 year old with history of obstructive sleep apnea on CPAP therapy developed increased daytime sleepiness.  Presents for CPAP titration study.  SLEEP STUDY TECHNIQUE As per the AASM Manual for the Scoring of Sleep and Associated Events v2.3 (April 2016) with a hypopnea requiring 4% desaturations.  The channels recorded and monitored were frontal, central and occipital EEG, electrooculogram (EOG), submentalis EMG (chin), nasal and oral airflow, thoracic and abdominal wall motion, anterior tibialis EMG, snore microphone, electrocardiogram, and pulse oximetry. Continuous positive airway pressure (CPAP) was initiated at the beginning of the study and titrated to treat sleep-disordered breathing.  MEDICATIONS Medications self-administered by patient taken the night of the study : N/A  TECHNICIAN COMMENTS Comments added by technician: Patient was restless all through the night. Comments added by scorer: N/A  RESPIRATORY PARAMETERS Optimal PAP Pressure (cm): 14 AHI at Optimal Pressure (/hr): 3.0 Overall Minimal O2 (%): 87.0 Supine % at Optimal Pressure (%): 0 Minimal O2 at Optimal Pressure (%): 88.0   SLEEP ARCHITECTURE The study was initiated at 10:55:02 PM and ended at 5:17:37 AM.  Sleep onset time was 6.4 minutes and the sleep efficiency was 84.3%%. The total sleep time was 322.5 minutes.  The patient spent 5.1%% of the night in stage N1 sleep, 79.4%% in stage N2 sleep, 0.0%% in stage N3 and 15.5% in REM.Stage REM latency was 216.0 minutes  Wake after sleep onset was 53.7. Alpha intrusion was absent. Supine sleep was 0.00%.  CARDIAC DATA The 2 lead EKG demonstrated  atrial fibrillation. The mean heart rate was 75.8 beats per minute. Other EKG findings include: PVCs.  LEG MOVEMENT DATA The total Periodic Limb Movements of Sleep index was 97.3. A PLMS index of <15 is considered normal in adults.  IMPRESSIONS - He did well with CPAP pressure was 14 cm of water. - He did not require supplemental oxygen during this study.  DIAGNOSIS - Obstructive Sleep Apnea (327.23 [G47.33 ICD-10]) - Periodic Limb Movement During Sleep (327.51 [G47.61 ICD-10])  RECOMMENDATIONS - Trial of CPAP therapy on 14 cm H2O with a Medium size Resmed Full Face Mask AirFit F20 mask and heated humidification. - Assess for the presence of restless leg syndrome.  [Electronically signed] 02/13/2020 10:32 AM  Chesley Mires MD, ABSM Diplomate, American Board of Sleep Medicine   NPI: [id number]

## 2020-02-20 ENCOUNTER — Ambulatory Visit (HOSPITAL_COMMUNITY)
Admission: RE | Admit: 2020-02-20 | Discharge: 2020-02-21 | Disposition: A | Discharge status: Home / Self Care | Payer: Medicare Other | Attending: Internal Medicine | Admitting: Internal Medicine

## 2020-02-20 ENCOUNTER — Encounter (HOSPITAL_COMMUNITY): Payer: Self-pay | Admitting: Internal Medicine

## 2020-02-20 ENCOUNTER — Ambulatory Visit (HOSPITAL_COMMUNITY)
Admission: RE | Disposition: A | Discharge status: Home / Self Care | Payer: Medicare Other | Source: Home / Self Care | Attending: Internal Medicine

## 2020-02-20 ENCOUNTER — Other Ambulatory Visit: Payer: Self-pay

## 2020-02-20 DIAGNOSIS — I4891 Unspecified atrial fibrillation: Secondary | ICD-10-CM | POA: Diagnosis not present

## 2020-02-20 DIAGNOSIS — Z7901 Long term (current) use of anticoagulants: Secondary | ICD-10-CM | POA: Diagnosis not present

## 2020-02-20 DIAGNOSIS — I5032 Chronic diastolic (congestive) heart failure: Secondary | ICD-10-CM | POA: Diagnosis not present

## 2020-02-20 DIAGNOSIS — Z79899 Other long term (current) drug therapy: Secondary | ICD-10-CM | POA: Diagnosis not present

## 2020-02-20 DIAGNOSIS — I700 Atherosclerosis of aorta: Secondary | ICD-10-CM | POA: Diagnosis not present

## 2020-02-20 DIAGNOSIS — G4733 Obstructive sleep apnea (adult) (pediatric): Secondary | ICD-10-CM | POA: Insufficient documentation

## 2020-02-20 DIAGNOSIS — Z7951 Long term (current) use of inhaled steroids: Secondary | ICD-10-CM | POA: Insufficient documentation

## 2020-02-20 DIAGNOSIS — Z809 Family history of malignant neoplasm, unspecified: Secondary | ICD-10-CM | POA: Insufficient documentation

## 2020-02-20 DIAGNOSIS — M1990 Unspecified osteoarthritis, unspecified site: Secondary | ICD-10-CM | POA: Insufficient documentation

## 2020-02-20 DIAGNOSIS — I110 Hypertensive heart disease with heart failure: Secondary | ICD-10-CM | POA: Insufficient documentation

## 2020-02-20 DIAGNOSIS — Z8546 Personal history of malignant neoplasm of prostate: Secondary | ICD-10-CM | POA: Diagnosis not present

## 2020-02-20 DIAGNOSIS — Z9884 Bariatric surgery status: Secondary | ICD-10-CM | POA: Insufficient documentation

## 2020-02-20 DIAGNOSIS — Z888 Allergy status to other drugs, medicaments and biological substances status: Secondary | ICD-10-CM | POA: Insufficient documentation

## 2020-02-20 DIAGNOSIS — Z950 Presence of cardiac pacemaker: Secondary | ICD-10-CM

## 2020-02-20 DIAGNOSIS — F329 Major depressive disorder, single episode, unspecified: Secondary | ICD-10-CM | POA: Diagnosis not present

## 2020-02-20 DIAGNOSIS — Z923 Personal history of irradiation: Secondary | ICD-10-CM | POA: Insufficient documentation

## 2020-02-20 DIAGNOSIS — I4819 Other persistent atrial fibrillation: Secondary | ICD-10-CM | POA: Diagnosis present

## 2020-02-20 DIAGNOSIS — Z8249 Family history of ischemic heart disease and other diseases of the circulatory system: Secondary | ICD-10-CM | POA: Insufficient documentation

## 2020-02-20 DIAGNOSIS — I4892 Unspecified atrial flutter: Secondary | ICD-10-CM | POA: Insufficient documentation

## 2020-02-20 DIAGNOSIS — Z6841 Body Mass Index (BMI) 40.0 and over, adult: Secondary | ICD-10-CM | POA: Insufficient documentation

## 2020-02-20 HISTORY — PX: AV NODE ABLATION: EP1193

## 2020-02-20 HISTORY — PX: PACEMAKER IMPLANT: EP1218

## 2020-02-20 HISTORY — DX: Presence of cardiac pacemaker: Z95.0

## 2020-02-20 HISTORY — PX: INSERT / REPLACE / REMOVE PACEMAKER: SUR710

## 2020-02-20 LAB — POCT ACTIVATED CLOTTING TIME: Activated Clotting Time: 164 seconds

## 2020-02-20 SURGERY — AV NODE ABLATION
Anesthesia: LOCAL

## 2020-02-20 MED ORDER — BUPIVACAINE HCL (PF) 0.25 % IJ SOLN
INTRAMUSCULAR | Status: AC
  Filled 2020-02-20: qty 30

## 2020-02-20 MED ORDER — ALBUTEROL SULFATE (2.5 MG/3ML) 0.083% IN NEBU: 2.5000 mg | INHALATION_SOLUTION | Freq: Four times a day (QID) | RESPIRATORY_TRACT | Status: DC | PRN

## 2020-02-20 MED ORDER — MIDAZOLAM HCL 5 MG/5ML IJ SOLN
INTRAMUSCULAR | Status: AC
  Filled 2020-02-20: qty 5

## 2020-02-20 MED ORDER — SODIUM CHLORIDE 0.9 % IV SOLN: INTRAVENOUS | Status: DC

## 2020-02-20 MED ORDER — UMECLIDINIUM BROMIDE 62.5 MCG/INH IN AEPB
1.0000 | INHALATION_SPRAY | Freq: Every day | RESPIRATORY_TRACT | Status: DC
  Administered 2020-02-21: 1 via RESPIRATORY_TRACT
  Filled 2020-02-20: qty 7

## 2020-02-20 MED ORDER — POLYVINYL ALCOHOL 1.4 % OP SOLN
1.0000 [drp] | Freq: Every day | OPHTHALMIC | Status: DC | PRN
  Filled 2020-02-20: qty 15

## 2020-02-20 MED ORDER — IOHEXOL 350 MG/ML SOLN
INTRAVENOUS | Status: DC | PRN
  Administered 2020-02-20: 15 mL

## 2020-02-20 MED ORDER — ACETAMINOPHEN 500 MG PO TABS: 1000.0000 mg | ORAL_TABLET | Freq: Four times a day (QID) | ORAL | Status: DC | PRN

## 2020-02-20 MED ORDER — LIDOCAINE HCL 1 % IJ SOLN
INTRAMUSCULAR | Status: AC
  Filled 2020-02-20: qty 20

## 2020-02-20 MED ORDER — HEPARIN (PORCINE) IN NACL 1000-0.9 UT/500ML-% IV SOLN
INTRAVENOUS | Status: AC
  Filled 2020-02-20: qty 500

## 2020-02-20 MED ORDER — ESCITALOPRAM OXALATE 10 MG PO TABS
10.0000 mg | ORAL_TABLET | Freq: Every day | ORAL | Status: DC
  Administered 2020-02-21: 10 mg via ORAL
  Filled 2020-02-20: qty 1

## 2020-02-20 MED ORDER — CHLORHEXIDINE GLUCONATE 4 % EX LIQD: 4.0000 "application " | Freq: Once | CUTANEOUS | Status: DC

## 2020-02-20 MED ORDER — ADULT MULTIVITAMIN W/MINERALS CH
1.0000 | ORAL_TABLET | Freq: Every day | ORAL | Status: DC
  Administered 2020-02-21: 1 via ORAL
  Filled 2020-02-20: qty 1

## 2020-02-20 MED ORDER — LIDOCAINE HCL (PF) 1 % IJ SOLN
INTRAMUSCULAR | Status: DC | PRN
  Administered 2020-02-20: 40 mL

## 2020-02-20 MED ORDER — CEFAZOLIN SODIUM-DEXTROSE 2-4 GM/100ML-% IV SOLN
2.0000 g | Freq: Four times a day (QID) | INTRAVENOUS | Status: AC
  Administered 2020-02-20 – 2020-02-21 (×3): 2 g via INTRAVENOUS
  Filled 2020-02-20 (×3): qty 100

## 2020-02-20 MED ORDER — SODIUM CHLORIDE 0.9 % IV SOLN
INTRAVENOUS | Status: AC
  Filled 2020-02-20: qty 2

## 2020-02-20 MED ORDER — VITAMIN D 25 MCG (1000 UNIT) PO TABS
5000.0000 [IU] | ORAL_TABLET | Freq: Every day | ORAL | Status: DC
  Administered 2020-02-21: 5000 [IU] via ORAL
  Filled 2020-02-20: qty 5

## 2020-02-20 MED ORDER — MIDAZOLAM HCL 5 MG/5ML IJ SOLN
INTRAMUSCULAR | Status: DC | PRN
  Administered 2020-02-20 (×8): 1 mg via INTRAVENOUS

## 2020-02-20 MED ORDER — BUMETANIDE 2 MG PO TABS
2.0000 mg | ORAL_TABLET | Freq: Every day | ORAL | Status: DC
  Administered 2020-02-21: 2 mg via ORAL
  Filled 2020-02-20 (×2): qty 1

## 2020-02-20 MED ORDER — ONDANSETRON HCL 4 MG/2ML IJ SOLN: 4.0000 mg | Freq: Four times a day (QID) | INTRAMUSCULAR | Status: DC | PRN

## 2020-02-20 MED ORDER — BUPROPION HCL ER (XL) 150 MG PO TB24
300.0000 mg | ORAL_TABLET | Freq: Every day | ORAL | Status: DC
  Administered 2020-02-21: 300 mg via ORAL
  Filled 2020-02-20: qty 2

## 2020-02-20 MED ORDER — OXYCODONE HCL 5 MG PO TABS
5.0000 mg | ORAL_TABLET | ORAL | Status: AC | PRN
  Administered 2020-02-20 – 2020-02-21 (×2): 5 mg via ORAL
  Filled 2020-02-20 (×2): qty 1

## 2020-02-20 MED ORDER — TAMSULOSIN HCL 0.4 MG PO CAPS
0.8000 mg | ORAL_CAPSULE | Freq: Every day | ORAL | Status: DC
  Administered 2020-02-20: 0.8 mg via ORAL
  Filled 2020-02-20: qty 2

## 2020-02-20 MED ORDER — HEPARIN SODIUM (PORCINE) 1000 UNIT/ML IJ SOLN
INTRAMUSCULAR | Status: AC
  Filled 2020-02-20: qty 1

## 2020-02-20 MED ORDER — DEXTROSE 5 % IV SOLN
3.0000 g | INTRAVENOUS | Status: AC
  Administered 2020-02-20: 3 g via INTRAVENOUS
  Filled 2020-02-20: qty 3000

## 2020-02-20 MED ORDER — FENTANYL CITRATE (PF) 100 MCG/2ML IJ SOLN
INTRAMUSCULAR | Status: DC | PRN
  Administered 2020-02-20 (×7): 12.5 ug via INTRAVENOUS

## 2020-02-20 MED ORDER — METOPROLOL TARTRATE 50 MG PO TABS
75.0000 mg | ORAL_TABLET | Freq: Two times a day (BID) | ORAL | Status: DC
  Administered 2020-02-20 – 2020-02-21 (×2): 75 mg via ORAL
  Filled 2020-02-20 (×2): qty 1

## 2020-02-20 MED ORDER — HEPARIN (PORCINE) IN NACL 1000-0.9 UT/500ML-% IV SOLN
INTRAVENOUS | Status: DC | PRN
  Administered 2020-02-20 (×2): 500 mL

## 2020-02-20 MED ORDER — BUPIVACAINE HCL (PF) 0.25 % IJ SOLN
INTRAMUSCULAR | Status: DC | PRN
  Administered 2020-02-20: 20 mL

## 2020-02-20 MED ORDER — CEFAZOLIN SODIUM-DEXTROSE 2-4 GM/100ML-% IV SOLN
INTRAVENOUS | Status: AC
  Filled 2020-02-20: qty 100

## 2020-02-20 MED ORDER — ALBUTEROL SULFATE HFA 108 (90 BASE) MCG/ACT IN AERS: 2.0000 | INHALATION_SPRAY | Freq: Four times a day (QID) | RESPIRATORY_TRACT | Status: DC | PRN

## 2020-02-20 MED ORDER — POLYETHYL GLYCOL-PROPYL GLYCOL 0.4-0.3 % OP SOLN: 1.0000 [drp] | Freq: Every day | OPHTHALMIC | Status: DC | PRN

## 2020-02-20 MED ORDER — FENTANYL CITRATE (PF) 100 MCG/2ML IJ SOLN
INTRAMUSCULAR | Status: AC
  Filled 2020-02-20: qty 2

## 2020-02-20 MED ORDER — HEPARIN SODIUM (PORCINE) 1000 UNIT/ML IJ SOLN
INTRAMUSCULAR | Status: DC | PRN
  Administered 2020-02-20: 5000 [IU] via INTRAVENOUS

## 2020-02-20 MED ORDER — CYANOCOBALAMIN 1000 MCG/ML IJ SOLN
1000.0000 ug | INTRAMUSCULAR | Status: DC
  Filled 2020-02-20: qty 1

## 2020-02-20 MED ORDER — ACETAMINOPHEN 325 MG PO TABS
325.0000 mg | ORAL_TABLET | ORAL | Status: DC | PRN
  Administered 2020-02-20: 325 mg via ORAL
  Filled 2020-02-20: qty 2

## 2020-02-20 MED ORDER — SODIUM CHLORIDE 0.9 % IV SOLN
80.0000 mg | INTRAVENOUS | Status: AC
  Administered 2020-02-20: 80 mg

## 2020-02-20 MED ORDER — TIOTROPIUM BROMIDE MONOHYDRATE 2.5 MCG/ACT IN AERS: 2.0000 | INHALATION_SPRAY | Freq: Every day | RESPIRATORY_TRACT | Status: DC

## 2020-02-20 SURGICAL SUPPLY — 17 items
CABLE SURGICAL S-101-97-12 (CABLE) ×2 IMPLANT
CATH BLAZER 7FR 4MM LG 5031TK2 (ABLATOR) ×1 IMPLANT
CATH CELSIUS THERMO F CV 7FR (ABLATOR) ×1 IMPLANT
CATH RIGHTSITE C315HIS02 (CATHETERS) ×1 IMPLANT
GUIDEWIRE ANGLED .035X150CM (WIRE) ×1 IMPLANT
IPG PACE AZUR XT DR MRI W1DR01 (Pacemaker) IMPLANT
LEAD CAPSURE NOVUS 5076-52CM (Lead) ×1 IMPLANT
LEAD SELECT SECURE 3830 383069 (Lead) IMPLANT
PACE AZURE XT DR MRI W1DR01 (Pacemaker) ×2 IMPLANT
PACK EP LATEX FREE (CUSTOM PROCEDURE TRAY) ×2
PACK EP LF (CUSTOM PROCEDURE TRAY) ×1 IMPLANT
PAD PRO RADIOLUCENT 2001M-C (PAD) ×2 IMPLANT
SELECT SECURE 3830 383069 (Lead) ×2 IMPLANT
SHEATH 7FR PRELUDE SNAP 13 (SHEATH) ×6 IMPLANT
SHEATH PINNACLE 8F 10CM (SHEATH) ×2 IMPLANT
SLITTER 6232ADJ (MISCELLANEOUS) ×1 IMPLANT
TRAY PACEMAKER INSERTION (PACKS) ×2 IMPLANT

## 2020-02-20 NOTE — Interval H&P Note (Signed)
History and Physical Interval Note:  02/20/2020 1:01 PM  Eric Mayer  has presented today for surgery, with the diagnosis of Atrial Fibrillation.  The various methods of treatment have been discussed with the patient and family. After consideration of risks, benefits and other options for treatment, the patient has consented to  Procedure(s): AV NODE ABLATION (N/A) PACEMAKER IMPLANT (N/A) as a surgical intervention.  The patient's history has been reviewed, patient examined, no change in status, stable for surgery.  I have reviewed the patient's chart and labs.  Questions were answered to the patient's satisfaction.     Cristopher Peru

## 2020-02-20 NOTE — Progress Notes (Signed)
Patient transferred from cath lab at 1730hrs. Oriented to unit and given post pacer instructions.  Patient verbalized understanding.

## 2020-02-20 NOTE — Progress Notes (Signed)
Site area: rt groin fa and fv sheaths Site Prior to Removal:  Level 0 Pressure Applied For: 30 minutes Manual:   yes Patient Status During Pull:  stable Post Pull Site:  Level 0 Post Pull Instructions Given:  yes Post Pull Pulses Present: rt dp palpable Dressing Applied:  Gauze and tegaderm Bedrest begins @ 1630 Comments: IV saline locked

## 2020-02-21 ENCOUNTER — Encounter (HOSPITAL_COMMUNITY): Payer: Self-pay | Admitting: Internal Medicine

## 2020-02-21 ENCOUNTER — Ambulatory Visit (HOSPITAL_COMMUNITY): Payer: Medicare Other

## 2020-02-21 DIAGNOSIS — I4892 Unspecified atrial flutter: Secondary | ICD-10-CM | POA: Diagnosis not present

## 2020-02-21 DIAGNOSIS — I4819 Other persistent atrial fibrillation: Secondary | ICD-10-CM | POA: Diagnosis not present

## 2020-02-21 DIAGNOSIS — I4891 Unspecified atrial fibrillation: Secondary | ICD-10-CM | POA: Diagnosis not present

## 2020-02-21 DIAGNOSIS — I110 Hypertensive heart disease with heart failure: Secondary | ICD-10-CM | POA: Diagnosis not present

## 2020-02-21 DIAGNOSIS — I5032 Chronic diastolic (congestive) heart failure: Secondary | ICD-10-CM | POA: Diagnosis not present

## 2020-02-21 MED ORDER — APIXABAN 5 MG PO TABS: 5.0000 mg | ORAL_TABLET | Freq: Two times a day (BID) | ORAL | 1 refills | Status: DC

## 2020-02-21 MED FILL — Lidocaine HCl Local Inj 1%: INTRAMUSCULAR | Qty: 40 | Status: AC

## 2020-02-21 NOTE — Discharge Instructions (Signed)
After Your Pacemaker   ACTIVITY  Do not lift your arm above shoulder height for 1 week after your procedure. After 7 days, you may progress as below.     Thursday February 28, 2020  Friday February 29, 2020 Saturday March 01, 2020 Sunday March 02, 2020    Do not lift, push, pull, or carry anything over 10 pounds with the affected arm until 6 weeks (Thursday April 03, 2020 ) after your procedure.    Do NOT DRIVE until you have been seen for your wound check, or as long as instructed by your healthcare provider.    Ask your healthcare provider when you can go back to work   INCISION/Dressing Resume your Eliquis Sunday evening   Monitor your Pacemaker site for redness, swelling, and drainage. Call the device clinic at [phone] if you experience these symptoms or fever/chills.   If your incision is sealed with Steri-strips or staples, you may shower 10 days after your procedure or when told by your provider. Do not remove the steri-strips or let the shower hit directly on your site. You may wash around your site with soap and water.     Avoid lotions, ointments, or perfumes over your incision until it is well-healed.   You may use a hot tub or a pool AFTER your wound check appointment if the incision is completely closed.   PAcemaker Alerts:  Some alerts are vibratory and others beep. These are NOT emergencies. Please call our office to let us know. If this occurs at night or on weekends, it can wait until the next business day. Send a remote transmission.   If your device is capable of reading fluid status (for heart failure), you will be offered monthly monitoring to review this with you.   DEVICE MANAGEMENT  Remote monitoring is used to monitor your pacemaker from home. This monitoring is scheduled every 91 days by our office. It allows [place] to keep an eye on the functioning of your device to ensure it is working properly. You will routinely see your Electrophysiologist annually  (more often if necessary).    You should receive your ID card for your new device in 4-8 weeks. Keep this card with you at all times once received. Consider wearing a medical alert bracelet or necklace.   Your Pacemaker may be MRI compatible. This will be discussed at your next office visit/wound check.  You should avoid contact with strong electric or magnetic fields.    Do not use amateur (ham) radio equipment or electric (arc) welding torches. MP3 player headphones with magnets should not be used. Some devices are safe to use if held at least 12 inches (30 cm) from your Pacemaker. These include power tools, lawn mowers, and speakers. If you are unsure if something is safe to use, ask your health care provider.   When using your cell phone, hold it to the ear that is on the opposite side from the Pacemaker. Do not leave your cell phone in a pocket over the Pacemaker.   You may safely use electric blankets, heating pads, computers, and microwave ovens.  Call the office right away if:  You have chest pain.  You feel more short of breath than you have felt before.  You feel more light-headed than you have felt before.  Your incision starts to open up.  This information is not intended to replace advice given to you by your health care provider. Make sure you discuss any questions you  have with your health care provider.  Cardiac Ablation, Care After  This sheet gives you information about how to care for yourself after your procedure. Your health care provider may also give you more specific instructions. If you have problems or questions, contact your health care provider. What can I expect after the procedure? After the procedure, it is common to have:  Bruising around your puncture site.  Tenderness around your puncture site.  Skipped heartbeats.  Tiredness (fatigue).  Follow these instructions at home: Puncture site care   Follow instructions from your health care  provider about how to take care of your puncture site. Make sure you: ? If present, leave stitches (sutures), skin glue, or adhesive strips in place. These skin closures may need to stay in place for up to 2 weeks. If adhesive strip edges start to loosen and curl up, you may trim the loose edges. Do not remove adhesive strips completely unless your health care provider tells you to do that.  Check your puncture site every day for signs of infection. Check for: ? Redness, swelling, or pain. ? Fluid or blood. If your puncture site starts to bleed, lie down on your back, apply firm pressure to the area, and contact your health care provider. ? Warmth. ? Pus or a bad smell. Driving  Do not drive for at least 4 days after your procedure or however long your health care provider recommends. (Do not resume driving if you have previously been instructed not to drive for other health reasons.)  Do not drive or use heavy machinery while taking prescription pain medicine. Activity  Avoid activities that take a lot of effort for at least 7 days after your procedure.  Do not lift anything that is heavier than 5 lb (4.5 kg) for one week.   No sexual activity for 1 week.   Return to your normal activities as told by your health care provider. Ask your health care provider what activities are safe for you. General instructions  Take over-the-counter and prescription medicines only as told by your health care provider.  Do not use any products that contain nicotine or tobacco, such as cigarettes and e-cigarettes. If you need help quitting, ask your health care provider.  You may shower after 24 hours, but Do not take baths, swim, or use a hot tub for 1 week.   Do not drink alcohol for 24 hours after your procedure.  Keep all follow-up visits as told by your health care provider. This is important. Contact a health care provider if:  You have redness, mild swelling, or pain around your puncture  site.  You have fluid or blood coming from your puncture site that stops after applying firm pressure to the area.  Your puncture site feels warm to the touch.  You have pus or a bad smell coming from your puncture site.  You have a fever.  You have chest pain or discomfort that spreads to your neck, jaw, or arm.  You are sweating a lot.  You feel nauseous.  You have a fast or irregular heartbeat.  You have shortness of breath.  You are dizzy or light-headed and feel the need to lie down.  You have pain or numbness in the arm or leg closest to your puncture site. Get help right away if:  Your puncture site suddenly swells.  Your puncture site is bleeding and the bleeding does not stop after applying firm pressure to the area. These symptoms may  represent a serious problem that is an emergency. Do not wait to see if the symptoms will go away. Get medical help right away. Call your local emergency services (911 in the [place] Do not drive yourself to the hospital. Summary  After the procedure, it is normal to have bruising and tenderness at the puncture site in your groin, neck, or forearm.  Check your puncture site every day for signs of infection.  Get help right away if your puncture site is bleeding and the bleeding does not stop after applying firm pressure to the area. This is a medical emergency. This information is not intended to replace advice given to you by your health care provider. Make sure you discuss any questions you have with your health care provider.

## 2020-02-21 NOTE — Discharge Summary (Addendum)
ELECTROPHYSIOLOGY PROCEDURE DISCHARGE SUMMARY    Patient ID: Eric Mayer,  MRN: [id number], DOB/AGE: 1941/05/29 79 y.o.  Admit date: 02/20/2020 Discharge date: 02/21/2020  Primary Care Physician: [name], MD  Primary Cardiologist: [name], MD  Electrophysiologist: Dr. Rayann Heman follows chronically  Primary Discharge Diagnosis:  Uncontrolled atrial fib/flutter with AV nodal modification and status post pacemaker implantation this admission  Secondary Discharge Diagnosis:  HTN Obesity Chronic diastolic CHF  Allergies  Allergen Reactions   Ace Inhibitors Cough   Xarelto [Rivaroxaban] Other (See Comments)    Joint pain     Procedures This Admission:  1.  Implantation of a Medtronic dual chamber PPM on 02/20/2020 by Dr. Lovena Le.  The patient received a Medtronic model number [id number] PPM with model number 5076 right atrial lead and 3830 right ventricular lead. There were no immediate post procedure complications. 2. AV nodal modification -> AV conduction remained present with a HR in 70 range, were unable to fully ablate AV node after 30 minutes of ablation attempts.  2.  CXR on 02/21/20  demonstrated no pneumothorax status post device implantation.   Brief HPI: Eric Mayer is a 79 y.o. male has been followed by electrophysiology in the outpatient setting for poorly controlled atrial fibrillation and  consideration of PPM implantation.  Past medical history includes above.  The patient has had symptomatic bradycardia without reversible causes identified.  Risks, benefits, and alternatives to PPM implantation were reviewed with the patient who wished to proceed.   Hospital Course:  The patient was admitted and underwent implantation of a Medtronic dual chamber PPM and AV nodal modification with details as outlined above.  He was monitored on telemetry overnight which demonstrated V pacing in the 70s.  Left chest was without hematoma or ecchymosis.  The device was  interrogated and found to be functioning normally.  CXR was obtained and demonstrated no pneumothorax status post device implantation.  Wound care, arm mobility, and restrictions were reviewed with the patient.  The patient was examined and considered stable for discharge to home.    Regarding blood thinner therapy, they were instructed to resume their Eliquis on Sunday evening.   Physical Exam: Vitals:   02/20/20 2039 02/20/20 2358 02/21/20 0000 02/21/20 0417  BP:  122/68  (!) 118/59  Pulse: 72  74   Resp:  14  18  Temp:  98 F (36.7 C)  97.8 F (36.6 C)  TempSrc:  Oral  Oral  SpO2: 97%  93%   Weight:    130.2 kg  Height:        GEN- The patient is well appearing, alert and oriented x 3 today.   HEENT: normocephalic, atraumatic; sclera clear, conjunctiva pink; hearing intact; oropharynx clear; neck supple, no JVP Lymph- no cervical lymphadenopathy Lungs- Clear to ausculation bilaterally, normal work of breathing.  No wheezes, rales, rhonchi Heart- Regular rate and rhythm, no murmurs, rubs or gallops, PMI not laterally displaced GI- soft, non-tender, non-distended, bowel sounds present, no hepatosplenomegaly Extremities- no clubbing, cyanosis, or edema; DP/PT/radial pulses 2+ bilaterally MS- no significant deformity or atrophy Skin- warm and dry, no rash or lesion, left chest without hematoma/ecchymosis Psych- euthymic mood, full affect Neuro- strength and sensation are intact   Labs:   Lab Results  Component Value Date   WBC 7.2 02/12/2020   HGB 10.5 (L) 02/12/2020   HCT 35.2 (L) 02/12/2020   MCV 87 02/12/2020   PLT 219 02/12/2020   No results for input(s): NA,  K, CL, CO2, BUN, CREATININE, CALCIUM, PROT, BILITOT, ALKPHOS, ALT, AST, GLUCOSE in the last 168 hours.  Invalid input(s): LABALBU  Discharge Medications:  Allergies as of 02/21/2020       Reactions   Ace Inhibitors Cough   Xarelto [rivaroxaban] Other (See Comments)   Joint pain        Medication List       STOP taking these medications    amiodarone 200 MG tablet Commonly known as: PACERONE   diltiazem 180 MG 24 hr capsule Commonly known as: CARDIZEM CD       TAKE these medications    acetaminophen 500 MG tablet Commonly known as: TYLENOL Take 1,000 mg by mouth every 6 (six) hours as needed for moderate pain or headache.   albuterol 108 (90 Base) MCG/ACT inhaler Commonly known as: VENTOLIN HFA Inhale 2 puffs into the lungs every 6 (six) hours as needed for wheezing or shortness of breath.   apixaban 5 MG Tabs tablet Commonly known as: Eliquis Take 1 tablet (5 mg total) by mouth 2 (two) times daily. Resume with Sunday evening dose. Start taking on: February 24, 2020 What changed:  how much to take additional instructions These instructions start on February 24, 2020. If you are unsure what to do until then, ask your doctor or other care provider.   bumetanide 2 MG tablet Commonly known as: BUMEX TAKE 1 TABLET BY MOUTH DAILY AND AN EXTRA TABLET FOR WEIGHT GAIN OF 2 POUNDSDAILY OR 5 POUNDS WEEKLY What changed: See the new instructions.   buPROPion 300 MG 24 hr tablet Commonly known as: WELLBUTRIN XL Take 300 mg by mouth daily.   clotrimazole 1 % cream Commonly known as: LOTRIMIN Apply 1 application topically daily as needed (itching).   cyanocobalamin 1000 MCG/ML injection Commonly known as: (VITAMIN B-12) Inject 1,000 mcg into the muscle every 30 (thirty) days.   escitalopram 10 MG tablet Commonly known as: LEXAPRO Take 10 mg by mouth daily.   magnesium oxide 400 MG tablet Commonly known as: MAG-OX TAKE 2 TABLETS EACH MORNING AND 1 TABLETEACH EVENING. What changed: See the new instructions.   metoprolol tartrate 25 MG tablet Commonly known as: LOPRESSOR Take 25 mg by mouth 2 (two) times daily. Take with 50 mg for a total of 75 mg   multivitamin with minerals Tabs tablet Take 1 tablet by mouth daily.   potassium chloride SA 20 MEQ tablet Commonly known as:  KLOR-CON Take 1 tablet (20 mEq total) by mouth 2 (two) times daily. What changed: when to take this   PRESERVISION AREDS 2 PO Take 1 tablet by mouth 2 (two) times a day.   Spiriva Respimat 2.5 MCG/ACT Aers Generic drug: Tiotropium Bromide Monohydrate Inhale 2 puffs into the lungs daily.   Systane Ultra 0.4-0.3 % Soln Generic drug: Polyethyl Glycol-Propyl Glycol Place 1 drop into both eyes daily as needed (for dry eyes).   tamsulosin 0.4 MG Caps capsule Commonly known as: FLOMAX Take 0.8 mg by mouth at bedtime.   Vitamin D3 125 MCG (5000 UT) Tabs Take 5,000 Units by mouth daily.        Disposition:     Follow-up Information     Evans Lance, MD Follow up on 06/03/2020.   Specialty: Cardiology Why: for 3 month post pacemaker check Contact information: [address] [place] [address] [phone]         [facility] Follow up on 03/04/2020.  Why: at 2 pm for post pacemaker wound check Contact information: [place] [address] [phone]                Duration of Discharge Encounter: Greater than 30 minutes including physician time.  Jacalyn Lefevre, PA-C  02/21/2020 12:53 PM  EP Attending  Patient seen and examined. Agree with the findings as noted above. 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 His rates are well controlled overnight with HR's in the 70's and regular. He will be discharged home with usual followup.  Mikle Bosworth.D.

## 2020-02-21 NOTE — Plan of Care (Signed)
Pt d/c to home with self care, d/c education complete,, printed and verbal instruction for medications, incision care and follow up appointments presented to pt and spouse with verbal understanding received.

## 2020-03-04 ENCOUNTER — Other Ambulatory Visit: Payer: Self-pay

## 2020-03-04 ENCOUNTER — Ambulatory Visit (INDEPENDENT_AMBULATORY_CARE_PROVIDER_SITE_OTHER): Payer: Medicare Other | Admitting: Emergency Medicine

## 2020-03-04 DIAGNOSIS — I4891 Unspecified atrial fibrillation: Secondary | ICD-10-CM | POA: Diagnosis not present

## 2020-03-05 DIAGNOSIS — G4733 Obstructive sleep apnea (adult) (pediatric): Secondary | ICD-10-CM

## 2020-03-06 LAB — CUP PACEART INCLINIC DEVICE CHECK
Battery Remaining Longevity: 177 mo
Battery Voltage: 3.23 V
Brady Statistic AP VP Percent: 6.17 %
Brady Statistic AP VS Percent: 0.57 %
Brady Statistic AS VP Percent: 4.56 %
Brady Statistic AS VS Percent: 88.7 %
Brady Statistic RA Percent Paced: 8.17 %
Brady Statistic RV Percent Paced: 10.73 %
Date Time Interrogation Session: 20210720142100
Implantable Lead Implant Date: 20210707
Implantable Lead Implant Date: 20210707
Implantable Lead Location: 753859
Implantable Lead Location: 753860
Implantable Lead Model: 3830
Implantable Lead Model: 5076
Implantable Pulse Generator Implant Date: 20210707
Lead Channel Impedance Value: 304 Ohm
Lead Channel Impedance Value: 475 Ohm
Lead Channel Impedance Value: 475 Ohm
Lead Channel Impedance Value: 646 Ohm
Lead Channel Pacing Threshold Amplitude: 0.5 V
Lead Channel Pacing Threshold Pulse Width: 0.4 ms
Lead Channel Sensing Intrinsic Amplitude: 1.375 mV
Lead Channel Sensing Intrinsic Amplitude: 20.625 mV
Lead Channel Setting Pacing Amplitude: 2 V
Lead Channel Setting Pacing Amplitude: 3.5 V
Lead Channel Setting Pacing Pulse Width: 0.4 ms
Lead Channel Setting Sensing Sensitivity: 1.2 mV

## 2020-03-06 NOTE — Progress Notes (Signed)
Wound check appointment. Steri-strips removed. Wound without redness or edema. Incision edges approximated, wound well healed. Normal device function. Thresholds, sensing, and impedances consistent with implant measurements. Device programmed at 3.5V/auto capture programmed on for extra safety margin until 3 month visit. Histogram distribution appropriate for patient and level of activity. AT/AF burden < 0.1%, 2 AT/AF episodes with egm that show AF with controlled v-rates and AF episode that is ongoing, + Eliquis. No high ventricular rates noted. Patient educated about wound care, arm mobility, lifting restrictions. ROV in 3 months with Dr Lovena Le on 06/03/20. Enrolled in remote follow-up and next remote scheduled for 05/22/20.

## 2020-03-06 NOTE — Telephone Encounter (Signed)
Patient requesting results from recent sleep study, please advise.

## 2020-03-11 NOTE — Telephone Encounter (Signed)
Sleep study showed that he would benefit from CPAP:  - Trial of CPAP therapy on 14 cm H2O with a Medium size Resmed Full Face Mask AirFit F20 mask and heated humidification.  Please order for him. Thanks.

## 2020-04-03 ENCOUNTER — Telehealth: Payer: Self-pay | Admitting: Internal Medicine

## 2020-04-03 NOTE — Telephone Encounter (Signed)
Transmission reviewed. 7 AT/AF episodes (<0.1%), though presenting rhythm shows AT at 162bpm (below AT/AF detection of 171bpm). Programmed DDI 60bpm. 1 NSVT episode (8 beats at 164bpm) and 1 fast A&V episode (appears SVT 160s, 32min 36sec duration). HR histogram appropriate.   Spoke with pt. He denies symptoms with episode. Reports he had been riding his exercise bike for 10 min and checked his HR. Automatic HR monitor on bike read 46bpm for a second or two. Pt reports this has also happened before when he was walking for exercise, checked HR on BP cuff and saw similar reading. Reassured pt of normal PPM function, base rate programmed at 60bpm. Explained could be experiencing a PVC. Advised to call back if any symptomatic episodes. Pt verbalizes understanding and agreement with plan.

## 2020-04-03 NOTE — Telephone Encounter (Signed)
New message:    Patient calling concerning his pacemaker. Patient was working out for 10 min after he worked his Pulse drop to 46, then it started come up. Please call patient.

## 2020-04-03 NOTE — Telephone Encounter (Signed)
Transmission received. I told him the nurse will review it and give him a call back.

## 2020-04-18 ENCOUNTER — Other Ambulatory Visit: Payer: Self-pay | Admitting: Internal Medicine

## 2020-04-18 NOTE — Telephone Encounter (Signed)
Dr. Rayann Heman is cardiologist.

## 2020-04-18 NOTE — Telephone Encounter (Signed)
Pt's pharmacy is requesting a refill on magnesium. Would Dr. Lovena Le like to refill this medication? Please address

## 2020-04-22 ENCOUNTER — Encounter: Payer: Self-pay | Admitting: Cardiovascular Disease

## 2020-04-22 ENCOUNTER — Ambulatory Visit (INDEPENDENT_AMBULATORY_CARE_PROVIDER_SITE_OTHER): Payer: Medicare Other | Admitting: Cardiovascular Disease

## 2020-04-22 ENCOUNTER — Other Ambulatory Visit: Payer: Self-pay

## 2020-04-22 VITALS — BP 124/60 | HR 78 | Ht 68.0 in | Wt 282.6 lb

## 2020-04-22 DIAGNOSIS — Z5181 Encounter for therapeutic drug level monitoring: Secondary | ICD-10-CM

## 2020-04-22 DIAGNOSIS — I5033 Acute on chronic diastolic (congestive) heart failure: Secondary | ICD-10-CM

## 2020-04-22 DIAGNOSIS — I480 Paroxysmal atrial fibrillation: Secondary | ICD-10-CM

## 2020-04-22 DIAGNOSIS — I10 Essential (primary) hypertension: Secondary | ICD-10-CM

## 2020-04-22 DIAGNOSIS — I4819 Other persistent atrial fibrillation: Secondary | ICD-10-CM

## 2020-04-22 NOTE — Patient Instructions (Signed)
Medication Instructions:  TAKE METOLAZONE TOMORROW ONE HOUR PRIOR TO YOUR BUMEX 2 TABLETS  ON Thursday RESUME BUMEX 1 DAILY   *If you need a refill on your cardiac medications before your next appointment, please call your pharmacy*  Lab Work: BMET/BNP TODAY   If you have labs (blood work) drawn today and your tests are completely normal, you will receive your results only by: [name] MyChart Message (if you have MyChart) OR . A paper copy in the mail If you have any lab test that is abnormal or we need to change your treatment, we will call you to review the results.  Testing/Procedures: Your physician has requested that you have an echocardiogram. Echocardiography is a painless test that uses sound waves to create images of your heart. It provides your doctor with information about the size and shape of your heart and how well your heart's chambers and valves are working. This procedure takes approximately one hour. There are no restrictions for this procedure.  Follow-Up: At [facility], you and your health needs are our priority.  As part of our continuing mission to provide you with exceptional heart care, we have created designated Provider Care Teams.  These Care Teams include your primary Cardiologist (physician) and Advanced Practice Providers (APPs -  Physician Assistants and Nurse Practitioners) who all work together to provide you with the care you need, when you need it.  We recommend signing up for the patient portal called "MyChart".  Sign up information is provided on this After Visit Summary.  MyChart is used to connect with patients for Virtual Visits (Telemedicine).  Patients are able to view lab/test results, encounter notes, upcoming appointments, etc.  Non-urgent messages can be sent to your provider as well.   To learn more about what you can do with MyChart, go to [email or web address].    Your next appointment:   4 week(s)  The format for your next appointment:    In Person  Provider:   You may see [name], MD or one of the following Advanced Practice Providers on your designated Care Team:    [name], PA-C  [place], [place]  Coletta Memos, [place]

## 2020-04-22 NOTE — Progress Notes (Signed)
Cardiology Office Note  Date:  04/22/2020   ID:  Eric Mayer, DOB 1941/02/08, MRN [id number]  PCP:  Eric Orn, MD  Cardiologist:   Eric Latch, MD   No chief complaint on file.  History of Present Illness: Eric Mayer is a 79 y.o. male with HTN, paroxysmal atrial fibrillation/flutter, chronic diastolic heart failure, OSA on CPAP, asthma, BPPV, and prostate adenocarcinoma here for follow up.  Eric Mayer initially presented 03/2015 with dyspnea on exertion and chronic lower extremity edema.  He had a dobutamine stress echo 03/27/15 that was negative for ischemia.  He had an echo 06/2015 that revealed LVEF 60-65% with mild aortic regurgitation and mitral regurgitation.  They were unable to evaluate diastolic function.  He was admitted 07/2015 for acute on chronic diastolic heart failure and diuresed with IV lasix. He also underwent DCCV.  Eric Mayer was seen in clinic 04/2016 and reported fatigue, shortness of breath, weight gain, and low blood pressures.  At that appointment he was switched from Lasix to Bumex and his valsartan was reduced. His weight is labile and fluctuates with dieteary changes.  He has been very responsive to extra doses of Bumex as needed.  Eric Mayer saw Eric Mayer 07/21/16 with increased dyspnea on exertion.  He was noted to be back in atrial fibrillation in the setting of not wearing his CPAP for the preceding week.  He was started on amiodarone 200mg  and metoprolol was reduced to 25mg  bid.  He converted back to sinus rhythm and diuresed 10 lb after returning to sinus rhythm.  Given that he was on amiodarone he was referred for PFTs 08/20/16 that revealed severe obstruction and likely moderate restriction.  There is also moderate diffusion defect.  He was referred to pulmonology and saw Eric Mayer. He underwent bronchoscopy on 09/21/16 which showed no intrathoracic obstruction or tracheal abnormality. He was started on Symbicort.  He was also referred to  atrial fibrillation for consideration of alternatives to amiodarone. At this time there was not felt to be any pharmacologic alternatives to amiodarone. He was thought to be a poor candidate for ablation.    Eric Mayer struggled with recurrent, symtpomatic atrial fibrillation.  He failed multiple DCCV, amiodarone, and ablation.  Eric Mayer underwent AV node ablation and PPM implantation 02/2020.  They were unable ot fully ablation the AV node. He has been working with PT to try and get his strength back.  Initially felt well and thought he was getting better.  In the last couple weeks he has been increasingly short of breath.  He is also noted that his weight is increasing.  He has been taking Bumex 2 mg every other day.  Increase this to daily and most recently called Eric Mayer office who recommended he take 4 mg daily.  He did have some increase in his urine output but does not feel that he is back to his baseline.  He continues to have some lower extremity edema.  He denies orthopnea or PND.  He is short palpitations that do not last long.  There is no associated lightheadedness or dizziness.  He denies any chest pain or pressure.  However he has been unable to participate in physical therapy due to his shortness of breath.  Past Medical History:  Diagnosis Date  . Anemia 1980s X 1  . Arthritis    "hands, knees, hips, ankles" (07/31/2015)  . Atrial flutter ([place]) 07/31/2015  . Chronic diastolic heart failure ([place])  a. 11/2015: Echo w/ EF of 60-65%, no WMA, Grade 2 DD, trivial AR, ascending aorta mildly dilated.   . Depression   . History of cardiovascular stress test    a. 03/2015: Dobutamine Stress Echo with no evidence of ischemia.   [name] History of gastric bypass   . History of peptic ulcer    1980's  . History of radiation therapy 1993   sarcoma of left groin, tx at [facility]  . History of sarcoma    1993  LEFT GOIN--  S/P SURGERY, RADIATION AND CHEMO IN [place]  . Hypertension   .  Hypogonadism in male   . Incomplete right bundle branch block   . 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 Nocturia   . OSA on CPAP   . Persistent atrial fibrillation ([place])       . 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 Primary prostate adenocarcinoma ([place]) DX 07/08/14   Gleason 7,  stage T1c  . PVC's (premature ventricular contractions) 11/21/2015  . Sarcoma ([place]) ~ 1993/1994   "of groin"  . Weakness of left leg    SECONDARY TO SURGICAL NERVE INJURY OF LEFT GOIN  . Wears glasses     Past Surgical History:  Procedure Laterality Date  . ATRIAL FIBRILLATION ABLATION N/A 06/08/2018   Procedure: ATRIAL FIBRILLATION ABLATION;  Surgeon: Eric Grayer, MD;  Location: [place] [facility];  Service: Cardiovascular;  Laterality: N/A;  . ATRIAL FIBRILLATION ABLATION N/A 12/20/2018   Procedure: ATRIAL FIBRILLATION ABLATION;  Surgeon: Eric Grayer, MD;  Location: [place] [facility];  Service: Cardiovascular;  Laterality: N/A;  . AV NODE ABLATION  02/20/2020  . AV NODE ABLATION N/A 02/20/2020   Procedure: AV NODE ABLATION;  Surgeon: Eric Lance, MD;  Location: [place] [facility];  Service: Cardiovascular;  Laterality: N/A;  . CARDIAC CATHETERIZATION  08-12-2003  dr Eric Mayer   Normal coronary arteries, normal wall motion, no sig. abnormalities  . CARDIOVERSION N/A 08/04/2015   Procedure: CARDIOVERSION;  Surgeon: Eric Latch, MD;  Location: [place];  Service: Cardiovascular;  Laterality: N/A;  . CARDIOVERSION N/A 10/28/2017   Procedure: CARDIOVERSION;  Surgeon: Eric Dresser, MD;  Location: [facility] ENDOSCOPY;  Service: Cardiovascular;  Laterality: N/A;  . CARDIOVERSION N/A 04/12/2018   Procedure: CARDIOVERSION;  Surgeon: Eric Headings, MD;  Location: [facility] ENDOSCOPY;  Service: Cardiovascular;  Laterality: N/A;  . CARDIOVERSION N/A 07/26/2018   Procedure: CARDIOVERSION;  Surgeon: Eric Dresser, MD;  Location[facility] END[facility];  Service: Cardiovascular;  Laterality: N/A;  . CARDIOVERSION N/A 10/09/2018   Procedure: CARDIOVERSION;  Surgeon: Sueanne Margarita, MD;  Location: [facility] ENDOSCOPY;  Service: Cardiovascular;  Laterality: N/A;  . COLONOSCOPY  07-03-2002  . [place]  . FRACTURE SURGERY    . GROIN DISSECTION Left 1992   [place]   SARCOMA SURGERY  . INGUINAL HERNIA REPAIR Right 1950s?  . INSERT / REPLACE / REMOVE PACEMAKER  02/20/2020  . INTESTINAL BYPASS  1976   GASTRIC FOR OBESITY  . JOINT REPLACEMENT    . PACEMAKER IMPLANT N/A 02/20/2020   Procedure: PACEMAKER IMPLANT;  Surgeon: Eric Lance, MD;  Location: [place] CV LAB;  Service: Cardiovascular;  Laterality: N/A;  . PATELLA FRACTURE SURGERY Left ~ 1995   "broke it twice; only had OR once"  . PROSTATE BIOPSY  06/2014  . RADIOACTIVE  SEED IMPLANT N/A 10/17/2014   Procedure: RADIOACTIVE SEED IMPLANT    ;  Surgeon: Ailene Rud, MD;  Location: [facility];  Service: Urology;  Laterality: N/A;   68 SEEDS IMPLANTED   . TEE WITHOUT CARDIOVERSION N/A 10/28/2017   Procedure: TRANSESOPHAGEAL ECHOCARDIOGRAM (TEE);  Surgeon: Eric Dresser, MD;  Location: [facility] ENDOSCOPY;  Service: Cardiovascular;  Laterality: N/A;  . TONSILLECTOMY  1950s  . TOTAL KNEE ARTHROPLASTY Right 12/17/2014   Procedure: TOTAL KNEE ARTHROPLASTY;  Surgeon: Melrose Nakayama, MD;  Location: [place];  Service: Orthopedics;  Laterality: Right;  . TRANSTHORACIC ECHOCARDIOGRAM  08-08-2003   moderate LVH/  ef 55-65%/  mild MR/  moderate LAE/  trivial TR/  trivial pericardial effusion posterior to the heart  . Impact  . VIDEO BRONCHOSCOPY Bilateral 09/21/2016   Procedure: VIDEO BRONCHOSCOPY WITHOUT FLUORO;  Surgeon: Collene Gobble, MD;  Location: [place];  Service: Cardiopulmonary;  Laterality: Bilateral;     Current Outpatient Medications  Medication Sig Dispense Refill  . acetaminophen  (TYLENOL) 500 MG tablet Take 1,000 mg by mouth every 6 (six) hours as needed for moderate pain or headache.    . albuterol (PROVENTIL HFA;VENTOLIN HFA) 108 (90 Base) MCG/ACT inhaler Inhale 2 puffs into the lungs every 6 (six) hours as needed for wheezing or shortness of breath.     [name] apixaban (ELIQUIS) 5 MG TABS tablet Take 1 tablet (5 mg total) by mouth 2 (two) times daily. Resume with Sunday evening dose. 180 tablet 1  . bumetanide (BUMEX) 2 MG tablet TAKE 1 TABLET BY MOUTH DAILY AND AN EXTRA TABLET FOR WEIGHT GAIN OF 2 POUNDSDAILY OR 5 POUNDS WEEKLY (Patient taking differently: Take 2 mg by mouth daily. ) 180 tablet 1  . buPROPion (WELLBUTRIN XL) 300 MG 24 hr tablet Take 300 mg by mouth daily.     . Cholecalciferol (VITAMIN D3) 125 MCG (5000 UT) TABS Take 5,000 Units by mouth daily.     . clotrimazole (LOTRIMIN) 1 % cream Apply 1 application topically daily as needed (itching).     . cyanocobalamin (,VITAMIN B-12,) 1000 MCG/ML injection Inject 1,000 mcg into the muscle every 30 (thirty) days.    [name] escitalopram (LEXAPRO) 10 MG tablet Take 10 mg by mouth daily.    . magnesium oxide (MAG-OX) 400 MG tablet TAKE 2 TABLETS EACH MORNING AND 1 TABLETEACH EVENING. 270 tablet 3  . metoprolol tartrate (LOPRESSOR) 25 MG tablet Take 25 mg by mouth 2 (two) times daily. Take with 50 mg for a total of 75 mg    . metoprolol tartrate (LOPRESSOR) 50 MG tablet Take 50 mg by mouth 2 (two) times daily.    . Multiple Vitamin (MULTIVITAMIN WITH MINERALS) TABS tablet Take 1 tablet by mouth daily.    . Multiple Vitamins-Minerals (PRESERVISION AREDS 2 PO) Take 1 tablet by mouth 2 (two) times a day.    Eric Mayer Glycol-Propyl Glycol (SYSTANE ULTRA) 0.4-0.3 % SOLN Place 1 drop into both eyes daily as needed (for dry eyes).     . potassium chloride SA (KLOR-CON) 20 MEQ tablet Take 1 tablet (20 mEq total) by mouth 2 (two) times daily. (Patient taking differently: Take 20 mEq by mouth daily. ) 180 tablet 3  . tamsulosin  (FLOMAX) 0.4 MG CAPS capsule Take 0.8 mg by mouth at bedtime.     . Tiotropium Bromide Monohydrate (SPIRIVA RESPIMAT) 2.5 MCG/ACT AERS Inhale 2 puffs into the lungs daily.     No  current facility-administered medications for this visit.    Allergies:   Ace inhibitors and Xarelto [rivaroxaban]    Social History:  The patient  reports that he has never smoked. He has never used smokeless tobacco. He reports that he does not drink alcohol and does not use drugs.   Family History:  The patient's family history includes Atrial fibrillation in his father; Cancer in his sister; Liver disease in his mother; Renal Disease in his mother; Stroke in his mother.    ROS:  Please see the history of present illness.   Otherwise, review of systems are positive for none.   All other systems are reviewed and negative.    PHYSICAL EXAM: VS:  BP 124/60   Pulse 78   Ht 5\' 8"  (1.727 m)   Wt 282 lb 9.6 oz (128.2 kg)   SpO2 98%   BMI 42.97 kg/m  , BMI Body mass index is 42.97 kg/m. GENERAL:  Well appearing HEENT: Pupils equal round and reactive, fundi not visualized, oral mucosa unremarkable NECK:  No jugular venous distention, waveform within normal limits, carotid upstroke brisk and symmetric, no bruits, no thyromegaly LYMPHATICS:  No cervical adenopathy LUNGS:  Clear to auscultation bilaterally HEART:  Irregularly irregular.  PMI not displaced or sustained,S1 and S2 within normal limits, no S3, no S4, no clicks, no rubs, no murmurs ABD:  Flat, positive bowel sounds normal in frequency in pitch, no bruits, no rebound, no guarding, no midline pulsatile mass, no hepatomegaly, no splenomegaly EXT:  2 plus pulses throughout, 1+ LE edema, no cyanosis no clubbing SKIN:  No rashes no nodules NEURO:  Cranial nerves II through XII grossly intact, motor grossly intact throughout PSYCH:  Cognitively intact, oriented to person place and time   EKG:  EKG is ordered today.   04/22/16: Sinus rhythm rate 67 bpm.   RSR' in V1. 05/20/16: Sinus rhythm.  Rate 75 bpm.  PACs.  06/29/17: Sinus rhythm.  Rate 83 bpm.  IVCD.  10/25/17: Atrial fibrillation.  Rate 115 bpm.  LAFB.   12/20/17: Sinus rhythm.  Sinus arrhythmia.  First-degree AV block.  Ventricular rate 68 bpm.  LAFB. 04/06/18: Atypical atrial flutter rate 106 bpm.   07/19/18: Atrial fibrillation.  Rate 91 bpm.  LAFB.  Poor R wave progression.   Lexiscan Myoview 04/21/18:  The left ventricular ejection fraction is normal (55-65%).  Nuclear stress EF: 60%.  There was no ST segment deviation noted during stress.  The study is normal.  This is a low risk study.   Soft tissue attenuation of anterior wall No ischemia or infarction Estimated EF 60%  Echo 12/11/15: Study Conclusions  - Left ventricle: The cavity size was normal. There was severe   focal basal hypertrophy of the septum. Systolic function was   normal. The estimated ejection fraction was in the range of 60%   to 65%. Wall motion was normal; there were no regional wall   motion abnormalities. Features are consistent with a pseudonormal   left ventricular filling pattern, with concomitant abnormal   relaxation and increased filling pressure (grade 2 diastolic   dysfunction). - Aortic valve: Trileaflet; mildly thickened, mildly calcified   leaflets. There was trivial regurgitation. - Aorta: Aortic root dimension: 41 mm (ED). - Ascending aorta: The ascending aorta was mildly dilated. - Left atrium: The atrium was moderately dilated.   Anterior-posterior dimension: 55 mm. Volume/bsa, ES, (1-plane   Simpson&'s, A2C): 40.2 ml/m^2.  Echo 07/08/15: Study Conclusions  - Left ventricle: The cavity  size was normal. Wall thickness was increased in a pattern of moderate LVH. Systolic function was normal. The estimated ejection fraction was in the range of 60% to 65%. The study is not technically sufficient to allow evaluation of LV diastolic function. - Aortic valve: Trileaflet.  Sclerosis without stenosis. There was mild regurgitation. - Mitral valve: Mildly thickened leaflets . There was mild regurgitation. - Left atrium: Moderately dilated at 43 ml/m2. - Right atrium: The atrium was mildly dilated. - Tricuspid valve: There was mild regurgitation. - Pulmonary arteries: PA peak pressure: 31 mm Hg (S). - Inferior vena cava: The vessel was normal in size. The respirophasic diameter changes were in the normal range (= 50%), consistent with normal central venous pressure.  Impressions:  - LVEF 60-65%, moderate LVH, normal wall motion, aortic valve sclerosis with mild AI, MR and TR, RVSP 31 mmHg., moderate LAE, mild RAE.   Recent Labs: 12/17/2019: ALT 17; TSH 3.163 02/12/2020: BUN 28; Creatinine, Ser 1.10; Hemoglobin 10.5; Platelets 219; Potassium 4.6; Sodium 143    Lipid Panel    Component Value Date/Time   CHOL 167 05/09/2018 0913   TRIG 164 (H) 05/09/2018 0913   HDL 46 05/09/2018 0913   CHOLHDL 3.6 05/09/2018 0913   LDLCALC 88 05/09/2018 0913      Wt Readings from Last 3 Encounters:  04/22/20 282 lb 9.6 oz (128.2 kg)  02/21/20 287 lb 0.6 oz (130.2 kg)  02/12/20 288 lb 12.8 oz (131 kg)     Other studies Reviewed: Additional studies/ records that were reviewed today include: n/a  ASSESSMENT AND PLAN:  # Atrial fibrillation/flutter: # PVCs: # s/p AV node ablation: Doing much better after ablation.  Continue Eliquis and metoprolol.  This patients CHA2DS2-VASc Score and unadjusted Ischemic Stroke Rate (% per year) is equal to 2.2 % stroke rate/year from a score of 2  Above score calculated as 1 point each if present [CHF, HTN, DM, Vascular=MI/PAD/Aortic Plaque, Age if 65-74, or Male]  Above score calculated as 2 points each if present [Age > 75, or Stroke/TIA/TE]  # Atypical Chest pain: Resolved.  Lexiscan Myoview was negative for ischemia 04/2018.  No chest pain recently.  # Hypertension:  BP well controlled on metoprolol.   #  Acute on chronic diastolic heart failure:  Eric Mayer has not responded to increased doses of Bumex.  We will have him take a dose of metolazone 2.5 mg 1 hour before his Bumex tomorrow.  He will continue 4 mg tomorrow and then go back to 2 mg daily.  Check a basic metabolic panel and BNP today.   Current medicines are reviewed at length with the patient today.  The patient does not have concerns regarding medicines.  The following changes have been made: none  Labs/ tests ordered today include:  Orders Placed This Encounter  Procedures  . Basic metabolic panel  . B Nat Peptide  . ECHOCARDIOGRAM COMPLETE     Disposition:   FU with Dr. Oval Linsey in 1 month.    Signed, Eric Latch, MD  04/22/2020 5:43 PM    [place]

## 2020-04-23 LAB — BASIC METABOLIC PANEL
BUN/Creatinine Ratio: 30 — ABNORMAL HIGH (ref 10–24)
BUN: 36 mg/dL — ABNORMAL HIGH (ref 8–27)
CO2: 22 mmol/L (ref 20–29)
Calcium: 8.9 mg/dL (ref 8.6–10.2)
Chloride: 106 mmol/L (ref 96–106)
Creatinine, Ser: 1.19 mg/dL (ref 0.76–1.27)
GFR calc Af Amer: 68 mL/min/{1.73_m2} (ref >59)
GFR calc non Af Amer: 59 mL/min/{1.73_m2} — ABNORMAL LOW (ref >59)
Glucose: 91 mg/dL (ref 65–99)
Potassium: 5.2 mmol/L (ref 3.5–5.2)
Sodium: 143 mmol/L (ref 134–144)

## 2020-04-23 LAB — BRAIN NATRIURETIC PEPTIDE: BNP: 151.1 pg/mL — ABNORMAL HIGH (ref 0.0–100.0)

## 2020-05-02 ENCOUNTER — Telehealth: Payer: Self-pay | Admitting: *Deleted

## 2020-05-02 DIAGNOSIS — I517 Cardiomegaly: Secondary | ICD-10-CM

## 2020-05-02 DIAGNOSIS — R0602 Shortness of breath: Secondary | ICD-10-CM

## 2020-05-02 DIAGNOSIS — I5033 Acute on chronic diastolic (congestive) heart failure: Secondary | ICD-10-CM

## 2020-05-02 DIAGNOSIS — Z5181 Encounter for therapeutic drug level monitoring: Secondary | ICD-10-CM

## 2020-05-02 NOTE — Telephone Encounter (Signed)
Discussed with Dr Oval Linsey, Rx Metolazone 5 mg on Saturday and Sunday labs Monday when he gets Echo Advised patient, verbalized understanding

## 2020-05-02 NOTE — Telephone Encounter (Signed)
Below [employer] received   Lyrick, Lagrand "Dow"  [name], MD 14 minutes ago (8:40 AM)   8:30 Friday morning.  Felt excellent yesterday and this morning, but wt.up to 280.  Ate nothing but salad yesterday with no dressing but cottage cheese and small amount of chopped chicken.  Boiled eggs and yogurt with fruit and milk for breakfast.  Also salt free tomato soup.  That was it and I put on wt.  Took bumex late at about 11:00.  I am purging today.  Took 2.5 metolazone and will take bumex in an hour.  Should drain some weight.  RIght now I feel great.  Bumex seems to work better when taken early with rest of pills.  Not passing as much when taken late.  Interesting, wonder why. Not as winded when I get around but waiting for echo on Monday.  Have a good weekend.     Meryl Crutch, RN routed conversation to You; [name], MD Yesterday (8:54 AM)  [name] "Dow"  [name], MD Yesterday (8:48 AM)   This is Thursday morning.  My weight has gradually risen to 278 from 273 on Tuesday.  Feeling much better but OT recommended suspend exercise until after ecg on 20th.  Exhausted from walking much, out of breath walking out to car with walker.  Taking bumex late because of meetings but does not seem to drain as much fluid. Plan to take metolazone and bumex tomorrow to control weight.  Please advise on test results. Feeling great last two days. Can do stationary bike device in apt without breathing hard but not walking.     You  [name] "Dow" 4 days ago   Hello Mr Markowicz, Dr Oval Linsey is out of the office until Wednesday. Can you please send an update Wednesday morning on how you are doing Thanks Cyndia Diver, Leonie Man, RN routed conversation to Dennis Bast; [name], MD 4 days ago  Dantae, Meunier "Dow"  [name], MD 4 days ago   Took 5mg  metolazone and 2 bumex hour later on Wednesday as per discussion. Took 1 bumex per day afterward.  Thursday wt. 276 from 284.  Tired and out of breath. Friday wt 272 loss in legs and stomach but exhausted stayed in bed or sitting all day.  Not as exhausted on Saturday and Sunday but short of breath going to bathroom. Canceled exercise Wed and Fri.  Better today, Monday but still tired.  Wt 273. BUN looked high as did BNP.  Kidney and heart congestion test, correct? Assume those would settle down after fluid and weight loss. Did not look drastically out of range, please advise. Thanks for your time.    Will forward to Dr Oval Linsey for review

## 2020-05-05 ENCOUNTER — Other Ambulatory Visit: Payer: Self-pay

## 2020-05-05 ENCOUNTER — Ambulatory Visit (HOSPITAL_COMMUNITY): Payer: Medicare Other | Attending: Cardiology

## 2020-05-05 ENCOUNTER — Other Ambulatory Visit: Payer: Medicare Other | Admitting: *Deleted

## 2020-05-05 DIAGNOSIS — Z5181 Encounter for therapeutic drug level monitoring: Secondary | ICD-10-CM

## 2020-05-05 DIAGNOSIS — I5033 Acute on chronic diastolic (congestive) heart failure: Secondary | ICD-10-CM | POA: Diagnosis present

## 2020-05-05 LAB — ECHOCARDIOGRAM COMPLETE
Area-P 1/2: 4.52 cm2
MV M vel: 5 m/s
MV Peak grad: 99.9 mmHg
S' Lateral: 2.2 cm

## 2020-05-05 NOTE — Telephone Encounter (Signed)
Below message received via Corrie Mckusick "Dow"  [name], MD 9 hours ago (9:21 AM)   Took 1 metolazone Saturday morn with 1 bumex.  Took 2 metolazone on Sunday with 1 bumex but lost less fluids.  Weight at 280 Friday, 276 Saturday, 273 on Sunday and 272 this morning, Monday. Echo and blood work this afternoon. Looking forward to seeing if we can get this thing under control.  Getting tired of salads.  Hope you had a good weekend.  Will forward to Dr Oval Linsey for review

## 2020-05-06 LAB — BASIC METABOLIC PANEL
BUN/Creatinine Ratio: 34 — ABNORMAL HIGH (ref 10–24)
BUN: 42 mg/dL — ABNORMAL HIGH (ref 8–27)
CO2: 27 mmol/L (ref 20–29)
Calcium: 9.1 mg/dL (ref 8.6–10.2)
Chloride: 96 mmol/L (ref 96–106)
Creatinine, Ser: 1.23 mg/dL (ref 0.76–1.27)
GFR calc Af Amer: 65 mL/min/{1.73_m2} (ref >59)
GFR calc non Af Amer: 56 mL/min/{1.73_m2} — ABNORMAL LOW (ref >59)
Glucose: 104 mg/dL — ABNORMAL HIGH (ref 65–99)
Potassium: 4 mmol/L (ref 3.5–5.2)
Sodium: 139 mmol/L (ref 134–144)

## 2020-05-06 NOTE — Telephone Encounter (Signed)
Echo shows that his heart is pumping well but his heart muscle is very thickened.  I want to check for something called amyloidosis.  He needs a PYP scan (search tumor localizing to order).  Also recommend starting Entresto 24/26mg  bid to help with heart failure and volume.  After his PYP, please have him seen at [facility] to get other thoughts.

## 2020-05-07 MED ORDER — ENTRESTO 24-26 MG PO TABS: 1.0000 | ORAL_TABLET | Freq: Two times a day (BID) | ORAL | 5 refills | Status: DC

## 2020-05-07 NOTE — Addendum Note (Signed)
Addended by: Alvina Filbert B on: 05/07/2020 06:06 PM   Modules accepted: Orders

## 2020-05-07 NOTE — Telephone Encounter (Signed)
Advised patient, verbalized understanding.  Messages sent to scheduling to arrange appointments  Patient is asking about lab results and if ok to resume PT Will forward to Dr Oval Linsey for review

## 2020-05-08 ENCOUNTER — Telehealth: Payer: Self-pay | Admitting: *Deleted

## 2020-05-08 ENCOUNTER — Telehealth: Payer: Self-pay

## 2020-05-08 DIAGNOSIS — Z5181 Encounter for therapeutic drug level monitoring: Secondary | ICD-10-CM

## 2020-05-08 DIAGNOSIS — N289 Disorder of kidney and ureter, unspecified: Secondary | ICD-10-CM

## 2020-05-08 NOTE — Telephone Encounter (Signed)
OK to resume PT as long as he is feeling up to it

## 2020-05-08 NOTE — Telephone Encounter (Signed)
New data on Entresto for patients with preserved ejection fraction came out recently.  This may be changing and is worth trying with their insurance.

## 2020-05-08 NOTE — Telephone Encounter (Signed)
**Note De-Identified Erial Fikes Obfuscation** I started a Entresto PA through covermymeds: Key: [id number]  The pts LV EF was 60 to 65% per Echo on 9/20 and in most cases ins. plans will not cover Entresto unless the pts EF is at or below 40% Will forward this message to Dr Oval Linsey and her nurse as Juluis Rainier so they will be aware if this PA is denied.

## 2020-05-08 NOTE — Telephone Encounter (Signed)
Advised patient, verbalized understanding  

## 2020-05-08 NOTE — Telephone Encounter (Signed)
Spoke with patient, he is concerned with elevated kidney functions on recent labs Will forward to Dr Oval Linsey for review

## 2020-05-09 NOTE — Addendum Note (Signed)
Addended by: Earvin Hansen on: 05/09/2020 06:48 PM   Modules accepted: Orders

## 2020-05-09 NOTE — Telephone Encounter (Signed)
This is because of all the diuretics it is taking to keep his fluid off.  Given that we are starting Entresto, recommend stopping metolazone and only using it as needed.  Repeat BMP in a week.

## 2020-05-09 NOTE — Telephone Encounter (Signed)
Advised patient, verbalized understanding  

## 2020-05-09 NOTE — Telephone Encounter (Signed)
**Note De-Identified Syre Knerr Obfuscation** Following message received from covermymeds:  [facility] Key: [id number] - PA Case ID: [id number]  Outcome Approved on September 23  Your PA request has been approved. Additional information will be provided in the approval communication.   DrugEntresto 24-26MG  tablets  [employer] PA Form [phone] NCPDP)  Original Claim Info75 01PRIOR AUTH REQ-MD CALL [phone]. 02DRUG REQUIRES PRIOR AUTHORIZATION  I have notified [facility] of this approval. I will forward this phone note to Dr Oval Linsey and her nurse as Juluis Rainier.

## 2020-05-09 NOTE — Telephone Encounter (Signed)
Perfect. Thank you!

## 2020-05-22 LAB — CUP PACEART REMOTE DEVICE CHECK
Battery Remaining Longevity: 177 mo
Battery Voltage: 3.21 V
Brady Statistic AP VP Percent: 5.15 %
Brady Statistic AP VS Percent: 0 %
Brady Statistic AS VP Percent: 2.93 %
Brady Statistic AS VS Percent: 91.92 %
Brady Statistic RA Percent Paced: 6.35 %
Brady Statistic RV Percent Paced: 8.08 %
Date Time Interrogation Session: 20211006224043
Implantable Lead Implant Date: 20210707
Implantable Lead Implant Date: 20210707
Implantable Lead Location: 753859
Implantable Lead Location: 753860
Implantable Lead Model: 3830
Implantable Lead Model: 5076
Implantable Pulse Generator Implant Date: 20210707
Lead Channel Impedance Value: 285 Ohm
Lead Channel Impedance Value: 418 Ohm
Lead Channel Impedance Value: 551 Ohm
Lead Channel Impedance Value: 570 Ohm
Lead Channel Pacing Threshold Amplitude: 0.625 V
Lead Channel Pacing Threshold Pulse Width: 0.4 ms
Lead Channel Sensing Intrinsic Amplitude: 1.125 mV
Lead Channel Sensing Intrinsic Amplitude: 1.125 mV
Lead Channel Sensing Intrinsic Amplitude: 22 mV
Lead Channel Sensing Intrinsic Amplitude: 22 mV
Lead Channel Setting Pacing Amplitude: 2 V
Lead Channel Setting Pacing Amplitude: 3.25 V
Lead Channel Setting Pacing Pulse Width: 0.4 ms
Lead Channel Setting Sensing Sensitivity: 1.2 mV

## 2020-05-23 ENCOUNTER — Telehealth (HOSPITAL_COMMUNITY): Payer: Self-pay | Admitting: *Deleted

## 2020-05-23 NOTE — Telephone Encounter (Signed)
Close encounter 

## 2020-05-27 ENCOUNTER — Inpatient Hospital Stay (HOSPITAL_COMMUNITY): Admission: RE | Admit: 2020-05-27 | Payer: Medicare Other | Source: Ambulatory Visit

## 2020-06-03 ENCOUNTER — Ambulatory Visit (INDEPENDENT_AMBULATORY_CARE_PROVIDER_SITE_OTHER): Payer: Medicare Other | Admitting: Internal Medicine

## 2020-06-03 ENCOUNTER — Other Ambulatory Visit: Payer: Self-pay

## 2020-06-03 ENCOUNTER — Telehealth (HOSPITAL_COMMUNITY): Payer: Self-pay | Admitting: *Deleted

## 2020-06-03 ENCOUNTER — Encounter: Payer: Self-pay | Admitting: Internal Medicine

## 2020-06-03 VITALS — BP 100/52 | HR 76 | Ht 68.0 in | Wt 286.0 lb

## 2020-06-03 DIAGNOSIS — Z950 Presence of cardiac pacemaker: Secondary | ICD-10-CM

## 2020-06-03 DIAGNOSIS — I4819 Other persistent atrial fibrillation: Secondary | ICD-10-CM

## 2020-06-03 DIAGNOSIS — I442 Atrioventricular block, complete: Secondary | ICD-10-CM | POA: Diagnosis not present

## 2020-06-03 LAB — CUP PACEART INCLINIC DEVICE CHECK
Battery Remaining Longevity: 180 mo
Battery Voltage: 3.21 V
Brady Statistic AP VP Percent: 3.87 %
Brady Statistic AP VS Percent: 0.03 %
Brady Statistic AS VP Percent: 3.77 %
Brady Statistic AS VS Percent: 92.34 %
Brady Statistic RA Percent Paced: 4.76 %
Brady Statistic RV Percent Paced: 7.64 %
Date Time Interrogation Session: 20211019164328
Implantable Lead Implant Date: 20210707
Implantable Lead Implant Date: 20210707
Implantable Lead Location: 753859
Implantable Lead Location: 753860
Implantable Lead Model: 3830
Implantable Lead Model: 5076
Implantable Pulse Generator Implant Date: 20210707
Lead Channel Impedance Value: 304 Ohm
Lead Channel Impedance Value: 437 Ohm
Lead Channel Impedance Value: 551 Ohm
Lead Channel Impedance Value: 589 Ohm
Lead Channel Pacing Threshold Amplitude: 0.5 V
Lead Channel Pacing Threshold Pulse Width: 0.4 ms
Lead Channel Sensing Intrinsic Amplitude: 1.25 mV
Lead Channel Sensing Intrinsic Amplitude: 23.125 mV
Lead Channel Setting Pacing Amplitude: 2 V
Lead Channel Setting Pacing Amplitude: 2 V
Lead Channel Setting Pacing Pulse Width: 0.4 ms
Lead Channel Setting Sensing Sensitivity: 1.2 mV

## 2020-06-03 MED ORDER — DIGOXIN 125 MCG PO TABS
0.1250 mg | ORAL_TABLET | Freq: Every day | ORAL | 3 refills | Status: DC
Start: 2020-06-03 — End: 2020-09-05

## 2020-06-03 NOTE — Progress Notes (Signed)
HPI [name] returns today for followup. He is a morbidly obese 79 yo man with atrial fib/flutter and a RVR whose rates could not be controlled with medical therapy. He underwent AV node ablation and PPM insertion. His procedure was complicated by our inability to create CHB. RF applied to the right and left side of the AV conduction system resulted in his HR going from 140 to 70. Since then he has improved but still has dyspnea with exertion. Review of the histograms on his ECG shows that 90% of his heart rates are under 100/min though he is only pacing 8% of the time. He has mild lower extremity swelling.  Allergies  Allergen Reactions  . Ace Inhibitors Cough  . Xarelto [Rivaroxaban] Other (See Comments)    Joint pain     Current Outpatient Medications  Medication Sig Dispense Refill  . acetaminophen (TYLENOL) 500 MG tablet Take 1,000 mg by mouth every 6 (six) hours as needed for moderate pain or headache.    [name] apixaban (ELIQUIS) 5 MG TABS tablet Take 1 tablet (5 mg total) by mouth 2 (two) times daily. Resume with Sunday evening dose. 180 tablet 1  . bumetanide (BUMEX) 2 MG tablet TAKE 1 TABLET BY MOUTH DAILY AND AN EXTRA TABLET FOR WEIGHT GAIN OF 2 POUNDSDAILY OR 5 POUNDS WEEKLY 180 tablet 1  . buPROPion (WELLBUTRIN XL) 300 MG 24 hr tablet Take 300 mg by mouth daily.     . Cholecalciferol (VITAMIN D3) 125 MCG (5000 UT) TABS Take 5,000 Units by mouth daily.     . clotrimazole (LOTRIMIN) 1 % cream Apply 1 application topically daily as needed (itching).     . cyanocobalamin (,VITAMIN B-12,) 1000 MCG/ML injection Inject 1,000 mcg into the muscle every 30 (thirty) days.    [name] escitalopram (LEXAPRO) 10 MG tablet Take 10 mg by mouth daily.    . magnesium oxide (MAG-OX) 400 MG tablet TAKE 2 TABLETS EACH MORNING AND 1 TABLETEACH EVENING. 270 tablet 3  . metoprolol tartrate (LOPRESSOR) 25 MG tablet Take 25 mg by mouth 2 (two) times daily. Take with 50 mg for a total of 75 mg    .  metoprolol tartrate (LOPRESSOR) 50 MG tablet Take 50 mg by mouth 2 (two) times daily.    . Multiple Vitamin (MULTIVITAMIN WITH MINERALS) TABS tablet Take 1 tablet by mouth daily.    . Multiple Vitamins-Minerals (PRESERVISION AREDS 2 PO) Take 1 tablet by mouth 2 (two) times a day.    [name] MYRBETRIQ 50 MG TB24 tablet Take 50 mg by mouth daily.    [name] Glycol-Propyl Glycol (SYSTANE ULTRA) 0.4-0.3 % SOLN Place 1 drop into both eyes daily as needed (for dry eyes).     . potassium chloride SA (KLOR-CON) 20 MEQ tablet Take 1 tablet (20 mEq total) by mouth 2 (two) times daily. (Patient taking differently: Take 20 mEq by mouth daily. ) 180 tablet 3  . sacubitril-valsartan (ENTRESTO) 24-26 MG Take 1 tablet by mouth 2 (two) times daily. 60 tablet 5  . tamsulosin (FLOMAX) 0.4 MG CAPS capsule Take 0.8 mg by mouth at bedtime.     . Tiotropium Bromide Monohydrate (SPIRIVA RESPIMAT) 2.5 MCG/ACT AERS Inhale 2 puffs into the lungs daily.    . digoxin (LANOXIN) 0.125 MG tablet Take 1 tablet (0.125 mg total) by mouth daily. 90 tablet 3   No current facility-administered medications for this visit.     Past Medical History:  Diagnosis Date  .  Anemia 1980s X 1  . Arthritis    "hands, knees, hips, ankles" (07/31/2015)  . Atrial flutter ([place]) 07/31/2015  . Chronic diastolic heart failure ([place])    a. 11/2015: Echo w/ EF of 60-65%, no WMA, Grade 2 DD, trivial AR, ascending aorta mildly dilated.   . Depression   . History of cardiovascular stress test    a. 03/2015: Dobutamine Stress Echo with no evidence of ischemia.   [name] History of gastric bypass   . History of peptic ulcer    1980's  . History of radiation therapy 1993   sarcoma of left groin, tx at [facility]  . History of sarcoma    1993  LEFT GOIN--  S/P SURGERY, RADIATION AND CHEMO IN [place]  . Hypertension   . Hypogonadism in male   . Incomplete right bundle branch block   . Nerve injury    SURGICAL NERVE INJURY S/P  LEFT GOIN REMOVAL SARCOMA  1992--  RESIDUAL WEAKNESS AND NUMBNESS UPPER LEFT LEG  . Nocturia   . OSA on CPAP   . Persistent atrial fibrillation ([place])       . 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 PVC's (premature ventricular contractions) 11/21/2015  . 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 Weakness of left leg    SECONDARY TO SURGICAL NERVE INJURY OF LEFT GOIN  . Wears glasses     ROS:   All systems reviewed and negative except as noted in the HPI.   Past Surgical History:  Procedure Laterality Date  . ATRIAL FIBRILLATION ABLATION N/A 06/08/2018   Procedure: ATRIAL FIBRILLATION ABLATION;  Surgeon: Thompson Grayer, MD;  Location: [place] CV LAB;  Service: Cardiovascular;  Laterality: N/A;  . ATRIAL FIBRILLATION ABLATION N/A 12/20/2018   Procedure: ATRIAL FIBRILLATION ABLATION;  Surgeon: Thompson Grayer, MD;  Location: [place] CV LAB;  Service: Cardiovascular;  Laterality: N/A;  . AV NODE ABLATION  02/20/2020  . AV NODE ABLATION N/A 02/20/2020   Procedure: AV NODE ABLATION;  Surgeon: Evans Lance, MD;  Location: [place] CV LAB;  Service: Cardiovascular;  Laterality: N/A;  . CARDIAC CATHETERIZATION  08-12-2003  dr Tressia Miners turner   Normal coronary arteries, normal wall motion, no sig. abnormalities  . CARDIOVERSION N/A 08/04/2015   Procedure: CARDIOVERSION;  Surgeon: [name], MD;  Location: [place];  Service: Cardiovascular;  Laterality: N/A;  . CARDIOVERSION N/A 10/28/2017   Procedure: CARDIOVERSION;  Surgeon: Larey Dresser, MD;  Location: [facility] ENDOSCOPY;  Service: Cardiovascular;  Laterality: N/A;  . CARDIOVERSION N/A 04/12/2018   Procedure: CARDIOVERSION;  Surgeon: Thayer Headings, MD;  Location: [facility] ENDOSCOPY;  Service: Cardiovascular;  Laterality: N/A;  . CARDIOVERSION N/A 07/26/2018   Procedure: CARDIOVERSION;  Surgeon: Larey Dresser, MD;  Location: [facility] ENDOSCOPY;  Service: Cardiovascular;   Laterality: N/A;  . CARDIOVERSION N/A 10/09/2018   Procedure: CARDIOVERSION;  Surgeon: Sueanne Margarita, MD;  Location: [facility] ENDOSCOPY;  Service: Cardiovascular;  Laterality: N/A;  . COLONOSCOPY  07-03-2002  . [place]  . FRACTURE SURGERY    . GROIN DISSECTION Left 1992   [place]   SARCOMA SURGERY  . INGUINAL HERNIA REPAIR Right 1950s?  . INSERT / REPLACE / REMOVE PACEMAKER  02/20/2020  . INTESTINAL BYPASS  1976   GASTRIC FOR OBESITY  . JOINT REPLACEMENT    . PACEMAKER IMPLANT N/A 02/20/2020  Procedure: PACEMAKER IMPLANT;  Surgeon: Evans Lance, MD;  Location: [place] CV LAB;  Service: Cardiovascular;  Laterality: N/A;  . PATELLA FRACTURE SURGERY Left ~ 1995   "broke it twice; only had OR once"  . PROSTATE BIOPSY  06/2014  . RADIOACTIVE SEED IMPLANT N/A 10/17/2014   Procedure: RADIOACTIVE SEED IMPLANT    ;  Surgeon: Ailene Rud, MD;  Location: [facility];  Service: Urology;  Laterality: N/A;   68 SEEDS IMPLANTED   . TEE WITHOUT CARDIOVERSION N/A 10/28/2017   Procedure: TRANSESOPHAGEAL ECHOCARDIOGRAM (TEE);  Surgeon: Larey Dresser, MD;  Location: [facility] ENDOSCOPY;  Service: Cardiovascular;  Laterality: N/A;  . TONSILLECTOMY  1950s  . TOTAL KNEE ARTHROPLASTY Right 12/17/2014   Procedure: TOTAL KNEE ARTHROPLASTY;  Surgeon: Melrose Nakayama, MD;  Location: [place];  Service: Orthopedics;  Laterality: Right;  . TRANSTHORACIC ECHOCARDIOGRAM  08-08-2003   moderate LVH/  ef 55-65%/  mild MR/  moderate LAE/  trivial TR/  trivial pericardial effusion posterior to the heart  . [place]  . VIDEO BRONCHOSCOPY Bilateral 09/21/2016   Procedure: VIDEO BRONCHOSCOPY WITHOUT FLUORO;  Surgeon: Collene Gobble, MD;  Location: [place];  Service: Cardiopulmonary;  Laterality: Bilateral;     Family History  Problem Relation Age of Onset  . Liver disease Mother   . Renal Disease Mother   . Stroke Mother   . Atrial fibrillation Father   .  Cancer Sister      Social History   Socioeconomic History  . Marital status: Married    Spouse name: Not on file  . Number of children: Not on file  . Years of education: Not on file  . Highest education level: Not on file  Occupational History  . Not on file  Tobacco Use  . Smoking status: Never Smoker  . Smokeless tobacco: Never Used  Vaping Use  . Vaping Use: Never used  Substance and Sexual Activity  . Alcohol use: No  . Drug use: No  . Sexual activity: Not Currently  Other Topics Concern  . 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 Difficulty of Paying Living Expenses: Not on file  Food Insecurity:   . Worried About Charity fundraiser in the Last Year: Not on file  . Ran Out of Food in the Last Year: Not on file  Transportation Needs:   . Lack of Transportation (Medical): Not on file  . Lack of Transportation (Non-Medical): Not on file  Physical Activity:   . Days of Exercise per Week: Not on file  . Minutes of Exercise per Session: Not on file  Stress:   . Feeling of Stress : Not on file  Social Connections:   . Frequency of Communication with Friends and Family: Not on file  . Frequency of Social Gatherings with Friends and Family: Not on file  . Attends Religious Services: Not on file  . Active Member of Clubs or Organizations: Not on file  . Attends Archivist Meetings: Not on file  . Marital Status: Not on file  Intimate Partner Violence:   . Fear of Current or Ex-Partner: Not on file  . Emotionally Abused: Not on file  . Physically Abused: Not on file  . Sexually Abused: Not on file  BP (!) 100/52   Pulse 76   Ht 5\' 8"  (1.727 m)   Wt 286 lb (129.7 kg)   SpO2 95%   BMI 43.49 kg/m   Physical Exam:  Well appearing obese 79 yo man, NAD HEENT: Unremarkable Neck:  6 cm JVD, no  thyromegally Lymphatics:  No adenopathy Back:  No CVA tenderness Lungs:  Clear with no wheezes HEART:  Regular rate rhythm, no murmurs, no rubs, no clicks Abd:  soft, positive bowel sounds, no organomegally, no rebound, no guarding Ext:  2 plus pulses, no edema, no cyanosis, no clubbing Skin:  No rashes no nodules Neuro:  CN II through XII intact, motor grossly intact  EKG - atrial fib with a controlled VR  DEVICE  Normal device function.  See PaceArt for details.   Assess/Plan: 1. Atrial fib/flutter - his rates are much improved but still not perfect. I recommended a trial of digoxin in conjunction with his beta blocker. 2. Chronic diastolic heart failure - his symptoms are class 2B. He is sedentary and I encouraged him to try and increase his activity. He will continue his current dose of diuretic but may need to uptitrate. 3. PPM - his medtronic DDD PM is working normally 4. Coags - he has had no bleeding on Eliquis.  Carleene Overlie Ardie Dragoo,MD

## 2020-06-03 NOTE — Patient Instructions (Addendum)
Medication Instructions:  Your physician has recommended you make the following change in your medication:   1.  Start taking digoxin 0.125 mg- Take one tablet by mouth daily  Labwork: You will get lab work today:  BMP and BNP  You will need to come back to the [facility] office in 3 weeks for lab work:  Digoxin level  ________________________________________________________  Testing/Procedures: None ordered.  Follow-Up: Your physician wants you to follow-up in: 3 months with Dr. Lovena Le.      Remote monitoring is used to monitor your Pacemaker from home. This monitoring reduces the number of office visits required to check your device to one time per year. It allows [place] to keep an eye on the functioning of your device to ensure it is working properly. You are scheduled for a device check from home on 08/21/2020. You may send your transmission at any time that day. If you have a wireless device, the transmission will be sent automatically. After your physician reviews your transmission, you will receive a postcard with your next transmission date.  Any Other Special Instructions Will Be Listed Below (If Applicable).  If you need a refill on your cardiac medications before your next appointment, please call your pharmacy.   Digoxin tablets or capsules What is this medicine? DIGOXIN (di JOX in) is used to treat congestive heart failure and heart rhythm problems. This medicine may be used for other purposes; ask your health care provider or pharmacist if you have questions. COMMON BRAND NAME(S): Digitek, Lanoxicaps, Lanoxin What should I tell my health care provider before I take this medicine? They need to know if you have any of these conditions:  certain heart rhythm disorders  heart disease or recent heart attack  kidney or liver disease  an unusual or allergic reaction to digoxin, other medicines, foods, dyes, or preservatives  pregnant or trying to get  pregnant  breast-feeding How should I use this medicine? Take this medicine by mouth with a glass of water. Follow the directions on the prescription label. Take your doses at regular intervals. Do not take your medicine more often than directed. Talk to your pediatrician regarding the use of this medicine in children. Special care may be needed. Overdosage: If you think you have taken too much of this medicine contact a poison control center or emergency room at once. NOTE: This medicine is only for you. Do not share this medicine with others. What if I miss a dose? If you miss a dose, take it as soon as you can. If it is almost time for your next dose, take only that dose. Do not take double or extra doses. What may interact with this medicine?  activated charcoal  albuterol  alprazolam  antacids  antiviral medicines for HIV or AIDS like ritonavir and saquinavir  calcium  certain antibiotics like azithromycin, clarithromycin, erythromycin, gentamicin, neomycin, trimethoprim, and tetracycline  certain medicines for blood pressure, heart disease, irregular heart beat  certain medicines for cancer  certain medicines for cholesterol like atorvastatin, cholestyramine, and colestipol  certain medicines for diabetes, like acarbose, exenatide, miglitol, and metformin  certain medicines for fungal infections like ketoconazole and itraconazole  certain medicines for stomach problems like omeprazole, esomeprazole, lansoprazole, rabeprazole, metoclopramide, and sucralfate  conivaptan  cyclosporine  diphenoxylate  epinephrine  kaolin; pectin  nefazodone  NSAIDS, medicines for pain and inflammation, like celecoxib, ibuprofen, or naproxen  penicillamine  phenytoin  propantheline  quinine  phenytoin  rifampin  succinylcholine  St. John's  Wort  sulfasalazine  teriparatide  thyroid hormones  tolvaptan This list may not describe all possible interactions.  Give your health care provider a list of all the medicines, herbs, non-prescription drugs, or dietary supplements you use. Also tell them if you smoke, drink alcohol, or use illegal drugs. Some items may interact with your medicine. What should I watch for while using this medicine? Visit your doctor or health care professional for regular checks on your progress. Do not stop taking this medicine without the advice of your doctor or health care professional, even if you feel better. Do not change the brand you are taking, other brands may affect you differently. Check your heart rate and blood pressure regularly while you are taking this medicine. Ask your doctor or health care professional what your heart rate and blood pressure should be, and when you should contact him or her. Your doctor or health care professional also may schedule regular blood tests and electrocardiograms to check your progress. Watch your diet. Less digoxin may be absorbed from the stomach if you have a diet high in bran fiber. Do not treat yourself for coughs, colds or allergies without asking your doctor or health care professional for advice. Some ingredients can increase possible side effects. What side effects may I notice from receiving this medicine? Side effects that you should report to your doctor or health care professional as soon as possible:  allergic reactions like skin rash, itching or hives, swelling of the face, lips, or tongue  changes in behavior, mood, or mental ability  changes in vision  confusion  fast, irregular heartbeat  feeling faint or lightheaded, falls  headache  nausea, vomiting  unusual bleeding, bruising  unusually weak or tired Side effects that usually do not require medical attention (report to your doctor or health care professional if they continue or are bothersome):  breast enlargement in men and women  diarrhea This list may not describe all possible side effects.  Call your doctor for medical advice about side effects. You may report side effects to FDA at 1-800-FDA-1088. Where should I keep my medicine? Keep out of the reach of children. Store at room temperature between 15 and 30 degrees C (59 and 86 degrees F). Protect from light and moisture. Throw away any unused medicine after the expiration date. NOTE: This sheet is a summary. It may not cover all possible information. If you have questions about this medicine, talk to your doctor, pharmacist, or health care provider.  2020 Elsevier/Gold Standard (2016-07-21 15:40:26)

## 2020-06-03 NOTE — Telephone Encounter (Signed)
Close encounter 

## 2020-06-04 ENCOUNTER — Telehealth (HOSPITAL_COMMUNITY): Payer: Self-pay | Admitting: *Deleted

## 2020-06-04 LAB — BASIC METABOLIC PANEL
BUN/Creatinine Ratio: 27 — ABNORMAL HIGH (ref 10–24)
BUN: 36 mg/dL — ABNORMAL HIGH (ref 8–27)
CO2: 22 mmol/L (ref 20–29)
Calcium: 8.8 mg/dL (ref 8.6–10.2)
Chloride: 110 mmol/L — ABNORMAL HIGH (ref 96–106)
Creatinine, Ser: 1.35 mg/dL — ABNORMAL HIGH (ref 0.76–1.27)
GFR calc Af Amer: 58 mL/min/{1.73_m2} — ABNORMAL LOW (ref >59)
GFR calc non Af Amer: 50 mL/min/{1.73_m2} — ABNORMAL LOW (ref >59)
Glucose: 107 mg/dL — ABNORMAL HIGH (ref 65–99)
Potassium: 5.4 mmol/L — ABNORMAL HIGH (ref 3.5–5.2)
Sodium: 145 mmol/L — ABNORMAL HIGH (ref 134–144)

## 2020-06-04 LAB — PRO B NATRIURETIC PEPTIDE: NT-Pro BNP: 948 pg/mL — ABNORMAL HIGH (ref 0–486)

## 2020-06-04 NOTE — Telephone Encounter (Signed)
Close encounter 

## 2020-06-05 ENCOUNTER — Ambulatory Visit (HOSPITAL_COMMUNITY)
Admission: RE | Admit: 2020-06-05 | Discharge: 2020-06-05 | Disposition: A | Discharge status: Home / Self Care | Payer: Medicare Other | Source: Ambulatory Visit | Attending: Cardiovascular Disease | Admitting: Cardiovascular Disease

## 2020-06-05 ENCOUNTER — Other Ambulatory Visit: Payer: Self-pay

## 2020-06-05 DIAGNOSIS — I517 Cardiomegaly: Secondary | ICD-10-CM | POA: Diagnosis not present

## 2020-06-05 DIAGNOSIS — I5033 Acute on chronic diastolic (congestive) heart failure: Secondary | ICD-10-CM | POA: Diagnosis not present

## 2020-06-05 MED ORDER — TECHNETIUM TC 99M PYROPHOSPHATE
19.5000 | Freq: Once | INTRAVENOUS | Status: AC
Start: 2020-06-05 — End: 2020-06-05
  Administered 2020-06-05: 19.5 via INTRAVENOUS

## 2020-06-09 ENCOUNTER — Telehealth (INDEPENDENT_AMBULATORY_CARE_PROVIDER_SITE_OTHER): Payer: Medicare Other | Admitting: Cardiovascular Disease

## 2020-06-09 ENCOUNTER — Encounter: Payer: Self-pay | Admitting: Cardiovascular Disease

## 2020-06-09 VITALS — BP 91/50 | HR 71 | Ht 68.0 in | Wt 279.0 lb

## 2020-06-09 DIAGNOSIS — I480 Paroxysmal atrial fibrillation: Secondary | ICD-10-CM

## 2020-06-09 DIAGNOSIS — I5033 Acute on chronic diastolic (congestive) heart failure: Secondary | ICD-10-CM

## 2020-06-09 DIAGNOSIS — I10 Essential (primary) hypertension: Secondary | ICD-10-CM

## 2020-06-09 NOTE — Patient Instructions (Addendum)
Medication Instructions:  Your physician recommends that you continue on your current medications as directed. Please refer to the Current Medication list given to you today.  *If you need a refill on your cardiac medications before your next appointment, please call your pharmacy*  Lab Work: NONE  Testing/Procedures: NONE    Follow-Up: At [facility], you and your health needs are our priority.  As part of our continuing mission to provide you with exceptional heart care, we have created designated Provider Care Teams.  These Care Teams include your primary Cardiologist (physician) and Advanced Practice Providers (APPs -  Physician Assistants and Nurse Practitioners) who all work together to provide you with the care you need, when you need it.  We recommend signing up for the patient portal called "MyChart".  Sign up information is provided on this After Visit Summary.  MyChart is used to connect with patients for Virtual Visits (Telemedicine).  Patients are able to view lab/test results, encounter notes, upcoming appointments, etc.  Non-urgent messages can be sent to your provider as well.   To learn more about what you can do with MyChart, go to [email or web address].    Your next appointment:     09/09/2020 at 2:20 PM WITH DR [facility]

## 2020-06-09 NOTE — Progress Notes (Signed)
Virtual Visit via Video Note   This visit type was conducted due to national recommendations for restrictions regarding the COVID-19 Pandemic (e.g. social distancing) in an effort to limit this patient's exposure and mitigate transmission in our community.  Due to his co-morbid illnesses, this patient is at least at moderate risk for complications without adequate follow up.  This format is felt to be most appropriate for this patient at this time.  All issues noted in this document were discussed and addressed.  A limited physical exam was performed with this format.  Please refer to the patient's chart for his consent to telehealth for [facility].  The patient was identified using 2 identifiers.  Date:  06/09/2020   ID:  Eric Mayer, DOB May 17, 1941, MRN [id number]  Patient Location: Home Provider Location: Office/Clinic  PCP:  [name], MD  Cardiologist:  [name], MD  Electrophysiologist:  None   Evaluation Performed:  Follow-Up Visit  Chief Complaint:  Heart failure  History of Present Illness:     The patient does not have symptoms concerning for COVID-19 infection (fever, chills, cough, or new shortness of breath).                Cardiology Office Note  Date:  06/09/2020   ID:  Eric Mayer, DOB 06/15/1941, MRN [id number]  PCP:  [name], MD  Cardiologist:   [name], MD   No chief complaint on file.  History of Present Illness: Eric Mayer is a 79 y.o. male with HTN, paroxysmal atrial fibrillation/flutter, chronic diastolic heart failure, OSA on CPAP, asthma, BPPV, and prostate adenocarcinoma here for follow up.  Eric Mayer initially presented 03/2015 with dyspnea on exertion and chronic lower extremity edema.  He had a dobutamine stress echo 03/27/15 that was negative for ischemia.  He had an echo 06/2015 that revealed LVEF 60-65% with mild aortic regurgitation and mitral regurgitation.  They were unable to evaluate  diastolic function.  He was admitted 07/2015 for acute on chronic diastolic heart failure and diuresed with IV lasix. He also underwent DCCV.  Eric Mayer was seen in clinic 04/2016 and reported fatigue, shortness of breath, weight gain, and low blood pressures.  At that appointment he was switched from Lasix to Bumex and his valsartan was reduced. His weight is labile and fluctuates with dieteary changes.  He has been very responsive to extra doses of Bumex as needed.  Given that he was on amiodarone he was referred for PFTs 08/20/16 that revealed severe obstruction and likely moderate restriction.  There is also moderate diffusion defect.  He was referred to pulmonology and saw Dr. Louis Meckel. He underwent bronchoscopy on 09/21/16 which showed no intrathoracic obstruction or tracheal abnormality. He was started on Symbicort.  He was also referred to atrial fibrillation clinic for consideration of alternatives to amiodarone. At this time there was not felt to be any pharmacologic alternatives to amiodarone. He was thought to be a poor candidate for ablation.  Eric Mayer struggled with recurrent, symtpomatic atrial fibrillation.  He failed multiple DCCV, amiodarone, and ultimately ablation 12/2018.  Eric Mayer underwent AV node ablation and PPM implantation 02/2020.  They were unable ot fully ablation the AV node.  He last saw Dr. Lovena Le on 10/19 and started on digoxin.  He has noted fewer palpitations since that time.  Eric Mayer has struggled with volume overload.  He has called our office and sent my chart messages several times due to shortness of breath  and edema.  At his last appointment he was instructed to take metolazone prior to his Bumex.  He had a repeat echocardiogram 04/2020 that revealed LVEF 60 to 65% with severe LVH.  The septum was 2.5 cm in the posterior wall 2.1 cm.  I am unable to measure at this computer, but it doesn't seem that these measurements were taken at end diastole.  Although there  is significant hypertrophy, it doesn't seem this severe.  There was significantly increased from 2017, at which time the posterior wall was 1.1 cm and the septum 1.8.  There was mild dilation of the ascending aorta at 4.1 cm.  He was referred for a PYP scan that was negative for ATTR amyloid.  He was also referred to the advanced heart failure clinic but has not yet been seen.  On his last labs last week his renal function was slightly worse with a creatinine increased from 1.2-1.3 and proBNP was elevated to 948.  We also started him on Entresto to assist with diastolic heart failure.  At that time it was recommended that he stop taking metolazone due to worsening renal function and hypokalemia.  He has been feeling well when sitting but gets short of breath when walking.  In general he was feeling better but for the last couple days he is more short of breath.  His weight has been mostly down but he at more on Saturday and now feels bloated.  His blood pressure is running a little lower.  He hasn't felt lightheaded or dizzy.  He has been trying to be more active but is no longer participating in PT.   Past Medical History:  Diagnosis Date  . Anemia 1980s X 1  . Arthritis    "hands, knees, hips, ankles" (07/31/2015)  . Atrial flutter ([place]) 07/31/2015  . Chronic diastolic heart failure ([place])    a. 11/2015: Echo w/ EF of 60-65%, no WMA, Grade 2 DD, trivial AR, ascending aorta mildly dilated.   . Depression   . History of cardiovascular stress test    a. 03/2015: Dobutamine Stress Echo with no evidence of ischemia.   [name] History of gastric bypass   . History of peptic ulcer    1980's  . History of radiation therapy 1993   sarcoma of left groin, tx at [facility]  . History of sarcoma    1993  LEFT GOIN--  S/P SURGERY, RADIATION AND CHEMO IN [place]  . Hypertension   . Hypogonadism in male   . Incomplete right bundle branch block   . Nerve injury    SURGICAL NERVE INJURY S/P  LEFT GOIN REMOVAL  SARCOMA 1992--  RESIDUAL WEAKNESS AND NUMBNESS UPPER LEFT LEG  . Nocturia   . OSA on CPAP   . Persistent atrial fibrillation ([place])       . 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 Primary prostate adenocarcinoma ([place]) DX 07/08/14   Gleason 7,  stage T1c  . PVC's (premature ventricular contractions) 11/21/2015  . Sarcoma ([place]) ~ 1993/1994   "of groin"  . Weakness of left leg    SECONDARY TO SURGICAL NERVE INJURY OF LEFT GOIN  . Wears glasses     Past Surgical History:  Procedure Laterality Date  . ATRIAL FIBRILLATION ABLATION N/A 06/08/2018   Procedure: ATRIAL FIBRILLATION ABLATION;  Surgeon: Thompson Grayer, MD;  Location: [place] [facility];  Service: Cardiovascular;  Laterality: N/A;  . ATRIAL FIBRILLATION ABLATION N/A 12/20/2018   Procedure: ATRIAL FIBRILLATION ABLATION;  Surgeon: Thompson Grayer, MD;  Location: [place] [facility];  Service: Cardiovascular;  Laterality: N/A;  . AV NODE ABLATION  02/20/2020  . AV NODE ABLATION N/A 02/20/2020   Procedure: AV NODE ABLATION;  Surgeon: Evans Lance, MD;  Location: [place] [facility];  Service: Cardiovascular;  Laterality: N/A;  . CARDIAC CATHETERIZATION  08-12-2003  dr Tressia Miners turner   Normal coronary arteries, normal wall motion, no sig. abnormalities  . CARDIOVERSION N/A 08/04/2015   Procedure: CARDIOVERSION;  Surgeon: [name], MD;  Location: [place];  Service: Cardiovascular;  Laterality: N/A;  . CARDIOVERSION N/A 10/28/2017   Procedure: CARDIOVERSION;  Surgeon: Larey Dresser, MD;  Location: [facility] ENDOSCOPY;  Service: Cardiovascular;  Laterality: N/A;  . CARDIOVERSION N/A 04/12/2018   Procedure: CARDIOVERSION;  Surgeon: Thayer Headings, MD;  Location: [facility] ENDOSCOPY;  Service: Cardiovascular;  Laterality: N/A;  . CARDIOVERSION N/A 07/26/2018   Procedure: CARDIOVERSION;  Surgeon: Larey Dresser, MD;  Location: [facility] ENDOSCOPY;  Service: Cardiovascular;  Laterality: N/A;  . CARDIOVERSION N/A 10/09/2018   Procedure:  CARDIOVERSION;  Surgeon: Sueanne Margarita, MD;  Location: [facility] ENDOSCOPY;  Service: Cardiovascular;  Laterality: N/A;  . COLONOSCOPY  07-03-2002  . [place]  . FRACTURE SURGERY    . GROIN DISSECTION Left 1992   [place]   SARCOMA SURGERY  . INGUINAL HERNIA REPAIR Right 1950s?  . INSERT / REPLACE / REMOVE PACEMAKER  02/20/2020  . INTESTINAL BYPASS  1976   GASTRIC FOR OBESITY  . JOINT REPLACEMENT    . PACEMAKER IMPLANT N/A 02/20/2020   Procedure: PACEMAKER IMPLANT;  Surgeon: Evans Lance, MD;  Location: [place] CV LAB;  Service: Cardiovascular;  Laterality: N/A;  . PATELLA FRACTURE SURGERY Left ~ 1995   "broke it twice; only had OR once"  . PROSTATE BIOPSY  06/2014  . RADIOACTIVE SEED IMPLANT N/A 10/17/2014   Procedure: RADIOACTIVE SEED IMPLANT    ;  Surgeon: Ailene Rud, MD;  Location: [facility];  Service: Urology;  Laterality: N/A;   68 SEEDS IMPLANTED   . TEE WITHOUT CARDIOVERSION N/A 10/28/2017   Procedure: TRANSESOPHAGEAL ECHOCARDIOGRAM (TEE);  Surgeon: Larey Dresser, MD;  Location: [facility] ENDOSCOPY;  Service: Cardiovascular;  Laterality: N/A;  . TONSILLECTOMY  1950s  . TOTAL KNEE ARTHROPLASTY Right 12/17/2014   Procedure: TOTAL KNEE ARTHROPLASTY;  Surgeon: Melrose Nakayama, MD;  Location: [place];  Service: Orthopedics;  Laterality: Right;  . TRANSTHORACIC ECHOCARDIOGRAM  08-08-2003   moderate LVH/  ef 55-65%/  mild MR/  moderate LAE/  trivial TR/  trivial pericardial effusion posterior to the heart  . [place]  . VIDEO BRONCHOSCOPY Bilateral 09/21/2016   Procedure: VIDEO BRONCHOSCOPY WITHOUT FLUORO;  Surgeon: Collene Gobble, MD;  Location: [place];  Service: Cardiopulmonary;  Laterality: Bilateral;     Current Outpatient Medications  Medication Sig Dispense Refill  . acetaminophen (TYLENOL) 500 MG tablet Take 1,000 mg by mouth every 6 (six) hours as needed for moderate pain or headache.    [name] apixaban (ELIQUIS) 5  MG TABS tablet Take 1 tablet (5 mg total) by mouth 2 (two) times daily. Resume with Sunday evening dose. 180 tablet 1  . bumetanide (BUMEX) 2 MG tablet TAKE 1 TABLET BY MOUTH DAILY AND AN EXTRA TABLET FOR WEIGHT GAIN OF 2 POUNDSDAILY OR 5 POUNDS WEEKLY 180 tablet 1  . buPROPion (WELLBUTRIN XL) 300 MG 24 hr tablet Take 300 mg by mouth daily.     [name]  Cholecalciferol (VITAMIN D3) 125 MCG (5000 UT) TABS Take 5,000 Units by mouth daily.     . clotrimazole (LOTRIMIN) 1 % cream Apply 1 application topically daily as needed (itching).     . cyanocobalamin (,VITAMIN B-12,) 1000 MCG/ML injection Inject 1,000 mcg into the muscle every 30 (thirty) days.    . digoxin (LANOXIN) 0.125 MG tablet Take 1 tablet (0.125 mg total) by mouth daily. 90 tablet 3  . escitalopram (LEXAPRO) 10 MG tablet Take 10 mg by mouth daily.    . magnesium oxide (MAG-OX) 400 MG tablet TAKE 2 TABLETS EACH MORNING AND 1 TABLETEACH EVENING. 270 tablet 3  . metoprolol tartrate (LOPRESSOR) 25 MG tablet Take 25 mg by mouth 2 (two) times daily. Take with 50 mg for a total of 75 mg    . metoprolol tartrate (LOPRESSOR) 50 MG tablet Take 50 mg by mouth 2 (two) times daily.    . Multiple Vitamin (MULTIVITAMIN WITH MINERALS) TABS tablet Take 1 tablet by mouth daily.    . Multiple Vitamins-Minerals (PRESERVISION AREDS 2 PO) Take 1 tablet by mouth 2 (two) times a day.    [name] MYRBETRIQ 50 MG TB24 tablet Take 50 mg by mouth daily.    Eric Mayer (SYSTANE ULTRA) 0.4-0.3 % SOLN Place 1 drop into both eyes daily as needed (for dry eyes).     . potassium chloride SA (KLOR-CON) 20 MEQ tablet Take 1 tablet (20 mEq total) by mouth 2 (two) times daily. (Patient taking differently: Take 20 mEq by mouth daily. ) 180 tablet 3  . sacubitril-valsartan (ENTRESTO) 24-26 MG Take 1 tablet by mouth 2 (two) times daily. 60 tablet 5  . tamsulosin (FLOMAX) 0.4 MG CAPS capsule Take 0.8 mg by mouth at bedtime.     . Tiotropium Bromide Monohydrate (SPIRIVA  RESPIMAT) 2.5 MCG/ACT AERS Inhale 2 puffs into the lungs daily.     No current facility-administered medications for this visit.    Allergies:   Ace inhibitors and Xarelto [rivaroxaban]    Social History:  The patient  reports that he has never smoked. He has never used smokeless tobacco. He reports that he does not drink alcohol and does not use drugs.   Family History:  The patient's family history includes Atrial fibrillation in his father; Cancer in his sister; Liver disease in his mother; Renal Disease in his mother; Stroke in his mother.    ROS:  Please see the history of present illness.   Otherwise, review of systems are positive for none.   All other systems are reviewed and negative.    PHYSICAL EXAM: BP (!) 91/50   Pulse 71   Ht 5\' 8"  (1.727 m)   Wt 279 lb (126.6 kg)   BMI 42.42 kg/m  GENERAL: Well-appearing.  No acute distress. HEENT: Pupils equal round.  Oral mucosa unremarkable NECK:  No jugular venous distention, no visible thyromegaly EXT:  1+ LE edema, no cyanosis no clubbing SKIN:  No rashes no nodules NEURO:  Speech fluent.  Cranial nerves grossly intact.  Moves all 4 extremities freely PSYCH:  Cognitively intact, oriented to person place and time  EKG:  EKG is not ordered today.   04/22/16: Sinus rhythm rate 67 bpm.  RSR' in V1. 05/20/16: Sinus rhythm.  Rate 75 bpm.  PACs.  06/29/17: Sinus rhythm.  Rate 83 bpm.  IVCD.  10/25/17: Atrial fibrillation.  Rate 115 bpm.  LAFB.   12/20/17: Sinus rhythm.  Sinus arrhythmia.  First-degree AV block.  Ventricular  rate 68 bpm.  LAFB. 04/06/18: Atypical atrial flutter rate 106 bpm.   07/19/18: Atrial fibrillation.  Rate 91 bpm.  LAFB.  Poor R wave progression.   Lexiscan Myoview 04/21/18:  The left ventricular ejection fraction is normal (55-65%).  Nuclear stress EF: 60%.  There was no ST segment deviation noted during stress.  The study is normal.  This is a low risk study.   Soft tissue attenuation of anterior wall  No ischemia or infarction Estimated EF 60%  Echo 12/11/15: Study Conclusions  - Left ventricle: The cavity size was normal. There was severe   focal basal hypertrophy of the septum. Systolic function was   normal. The estimated ejection fraction was in the range of 60%   to 65%. Wall motion was normal; there were no regional wall   motion abnormalities. Features are consistent with a pseudonormal   left ventricular filling pattern, with concomitant abnormal   relaxation and increased filling pressure (grade 2 diastolic   dysfunction). - Aortic valve: Trileaflet; mildly thickened, mildly calcified   leaflets. There was trivial regurgitation. - Aorta: Aortic root dimension: 41 mm (ED). - Ascending aorta: The ascending aorta was mildly dilated. - Left atrium: The atrium was moderately dilated.   Anterior-posterior dimension: 55 mm. Volume/bsa, ES, (1-plane   Simpson&'s, A2C): 40.2 ml/m^2.  Echo 07/08/15: Study Conclusions  - Left ventricle: The cavity size was normal. Wall thickness was increased in a pattern of moderate LVH. Systolic function was normal. The estimated ejection fraction was in the range of 60% to 65%. The study is not technically sufficient to allow evaluation of LV diastolic function. - Aortic valve: Trileaflet. Sclerosis without stenosis. There was mild regurgitation. - Mitral valve: Mildly thickened leaflets . There was mild regurgitation. - Left atrium: Moderately dilated at 43 ml/m2. - Right atrium: The atrium was mildly dilated. - Tricuspid valve: There was mild regurgitation. - Pulmonary arteries: PA peak pressure: 31 mm Hg (S). - Inferior vena cava: The vessel was normal in size. The respirophasic diameter changes were in the normal range (= 50%), consistent with normal central venous pressure.  Impressions:  - LVEF 60-65%, moderate LVH, normal wall motion, aortic valve sclerosis with mild AI, MR and TR, RVSP 31 mmHg., moderate  LAE, mild RAE.   Recent Labs: 12/17/2019: ALT 17; TSH 3.163 02/12/2020: Hemoglobin 10.5; Platelets 219 04/22/2020: BNP 151.1 06/03/2020: BUN 36; Creatinine, Ser 1.35; NT-Pro BNP 948; Potassium 5.4; Sodium 145    Lipid Panel    Component Value Date/Time   CHOL 167 05/09/2018 0913   TRIG 164 (H) 05/09/2018 0913   HDL 46 05/09/2018 0913   CHOLHDL 3.6 05/09/2018 0913   LDLCALC 88 05/09/2018 0913      Wt Readings from Last 3 Encounters:  06/09/20 279 lb (126.6 kg)  06/05/20 282 lb (127.9 kg)  06/03/20 286 lb (129.7 kg)     Other studies Reviewed: Additional studies/ records that were reviewed today include: n/a  ASSESSMENT AND PLAN:  # Atrial fibrillation/flutter: # PVCs: # s/p AV node ablation: Doing much better after ablation, though the AV node was not fully ablated.  Digoxin seems to be helping for rate control.  Continue Eliquis and metoprolol.  He has a digoxin level pending with Dr. Lovena Le.  This patients CHA2DS2-VASc Score and unadjusted Ischemic Stroke Rate (% per year) is equal to 4.8 % stroke rate/year from a score of 4  Above score calculated as 1 point each if present [CHF, HTN, DM, Vascular=MI/PAD/Aortic Plaque, Age if  65-74, or Male] Above score calculated as 2 points each if present [Age > 75, or Stroke/TIA/TE]  # Atypical Chest pain: Resolved.  Lexiscan Myoview was negative for ischemia 04/2018.  No chest pain recently.  # Hypertension:  BP well controlled on metoprolol and Entresto.  It has been low but he is asymptomatic.  Will continue to monitor.  # Acute on chronic diastolic heart failure:  Mr. Kleinfelter has struggled with volume overload.  He has severe LVH and was referred for PYP that was negative 05/2020.  Continue Bumex 2mg  daily.  Can take an extra dose if weight increases by 2lb in a day or 5lb in a week.  He responded well to metolazone but this has been held now that he is on [place].  He will see Advanced HF at the end of November.   Current  medicines are reviewed at length with the patient today.  The patient does not have concerns regarding medicines.  The following changes have been made: none  Labs/ tests ordered today include:  No orders of the defined types were placed in this encounter.    Disposition:   FU with Dr. Oval Linsey in 3 months.   COVID-19 Education: The signs and symptoms of COVID-19 were discussed with the patient and how to seek care for testing (follow up with PCP or arrange E-visit).  The importance of social distancing was discussed today.  Time:   Today, I have spent 22 minutes with the patient with telehealth technology discussing the above problems.   Signed, [name], MD  06/09/2020 9:32 AM    [facility]

## 2020-06-24 ENCOUNTER — Other Ambulatory Visit: Payer: Self-pay

## 2020-06-24 ENCOUNTER — Other Ambulatory Visit: Payer: Medicare Other

## 2020-06-24 DIAGNOSIS — Z950 Presence of cardiac pacemaker: Secondary | ICD-10-CM

## 2020-06-24 DIAGNOSIS — I4819 Other persistent atrial fibrillation: Secondary | ICD-10-CM

## 2020-06-24 DIAGNOSIS — I442 Atrioventricular block, complete: Secondary | ICD-10-CM

## 2020-06-25 LAB — DIGOXIN LEVEL: Digoxin, Serum: 0.7 ng/mL (ref 0.5–0.9)

## 2020-07-08 ENCOUNTER — Other Ambulatory Visit: Payer: Self-pay | Admitting: Nurse Practitioner

## 2020-07-12 NOTE — Progress Notes (Signed)
ADVANCED HF CLINIC CONSULT NOTE  Referring Physician: Dr. Oval Linsey Primary Care: [name], MD Primary Cardiologist: [name], MD  HPI:  Eric Mayer is a 79 y/o male with morbid obesity s/p gastric bypass 1980s, permanent AF s/p AVN ablation in 5/20 (unable to get CHB) and CRT, HTN, OSA on CPAP, prostate CA and diastolic HF. Referred by Dr. Oval Linsey for further evaluation of his HF and ongoing dyspnea.   Cath 2004 normal coronaries. Myoview 9/19. EF 60% no scar/ischemia  Saw Dr. Lovena Le 10/21 - device interrogation showed only 8% pacing. However 90% of HRs < 100bpm.   PFTs 08/20/16 revealed severe obstruction and likely moderate restriction.  There is also moderate diffusion defect.  He saw Dr. Lamonte Sakai and underwent bronchoscopy on 09/21/16 which showed no intrathoracic obstruction or tracheal abnormality.  Most recent echo 04/2020 revealed EF 60 to 65% with severe LVH.  The septum was 2.5 cm in the posterior wall 2.1 cm.  PYP 10/21 negative  He is currently on Entresto 24/26 bid, bumex 2mg  daily and prn metolazone.   He is here with his wife. Says fluid has been pretty labile but recently it has been much more stable. Remains very dyspneic even with minimal activity. Says it has been going on for over a year. Has good days and bad days. Wearing CPAP every night. Has not used metolazone in over a month. Sleeps on his side. No orthopnea or PND. Periodic CP but better lately. HR rates typically 70-75 on his AppleWatch. Hard to walk with left leg weakness. Lives at [place].    Review of Systems: [y] = yes, [ ]  = no   General: Weight gain [ ] ; Weight loss [ ] ; Anorexia Blue.Reese ]; Fatigue [ y]; Fever [ ] ; Chills [ ] ; Weakness Blue.Reese ]  Cardiac: Chest pain/pressure [ ] ; Resting SOB [ ] ; Exertional SOB [ y]; Orthopnea [ ] ; Pedal Edema Blue.Reese ]; Palpitations [ ] ; Syncope [ ] ; Presyncope [ ] ; Paroxysmal nocturnal dyspnea[ ]   Pulmonary: Cough [ ] ; Wheezing[ ] ; Hemoptysis[ ] ;  Sputum [ ] ; Snoring [ y]  GI: Vomiting[ ] ; Dysphagia[ ] ; Melena[ ] ; Hematochezia [ ] ; Heartburn[ ] ; Abdominal pain [ ] ; Constipation [ ] ; Diarrhea [ ] ; BRBPR [ ]   GU: Hematuria[ ] ; Dysuria [ ] ; Nocturia[ ]   Vascular: Pain in legs with walking [ ] ; Pain in feet with lying flat [ ] ; Non-healing sores [ ] ; Stroke [ ] ; TIA [ ] ; Slurred speech [ ] ;  Neuro: Headaches[ ] ; Vertigo[ ] ; Seizures[ ] ; Paresthesias[ ] ;Blurred vision [ ] ; Diplopia [ ] ; Vision changes [ ]   Ortho/Skin: Arthritis [ y]; Joint pain Blue.Reese ]; Muscle pain [ ] ; Joint swelling [ ] ; Back Pain Blue.Reese ]; Rash [ ]   Psych: Depression[ y]; Anxiety[ ]   Heme: Bleeding problems [ ] ; Clotting disorders [ ] ; Anemia [ y]  Endocrine: Diabetes [ ] ; Thyroid dysfunction[ ]    Past Medical History:  Diagnosis Date  . Anemia 1980s X 1  . Arthritis    "hands, knees, hips, ankles" (07/31/2015)  . Atrial flutter ([place]) 07/31/2015  . CHF (congestive heart failure) ([place])   . Chronic diastolic heart failure ([place])    a. 11/2015: Echo w/ EF of 60-65%, no WMA, Grade 2 DD, trivial AR, ascending aorta mildly dilated.   . Depression   . History of cardiovascular stress test    a. 03/2015: Dobutamine Stress Echo with no evidence of ischemia.   [name] History of gastric bypass   .  History of peptic ulcer    1980's  . History of radiation therapy 1993   sarcoma of left groin, tx at [facility]  . History of sarcoma    1993  LEFT GOIN--  S/P SURGERY, RADIATION AND CHEMO IN [place]  . Hypertension   . Hypogonadism in male   . Incomplete right bundle branch block   . 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 Nocturia   . OSA on CPAP   . Persistent atrial fibrillation ([place])       . 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 Primary prostate adenocarcinoma ([place]) DX 07/08/14   Gleason 7,  stage T1c  . PVC's (premature ventricular contractions) 11/21/2015  . 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 Weakness of left leg    SECONDARY TO SURGICAL NERVE INJURY OF LEFT GOIN  . Wears glasses     Current Outpatient Medications  Medication Sig Dispense Refill  . acetaminophen (TYLENOL) 500 MG tablet Take 1,000 mg by mouth every 6 (six) hours as needed for moderate pain or headache.    [name] apixaban (ELIQUIS) 5 MG TABS tablet Take 1 tablet (5 mg total) by mouth 2 (two) times daily. Resume with Sunday evening dose. 180 tablet 1  . bumetanide (BUMEX) 2 MG tablet TAKE 1 TABLET BY MOUTH DAILY AND AN EXTRA TABLET FOR WEIGHT GAIN OF 2 POUNDSDAILY OR 5 POUNDS WEEKLY 180 tablet 1  . buPROPion (WELLBUTRIN XL) 300 MG 24 hr tablet Take 300 mg by mouth daily.     . Cholecalciferol (VITAMIN D3) 125 MCG (5000 UT) TABS Take 5,000 Units by mouth daily.     . clotrimazole (LOTRIMIN) 1 % cream Apply 1 application topically daily as needed (itching).     . cyanocobalamin (,VITAMIN B-12,) 1000 MCG/ML injection Inject 1,000 mcg into the muscle every 30 (thirty) days.    . digoxin (LANOXIN) 0.125 MG tablet Take 1 tablet (0.125 mg total) by mouth daily. 90 tablet 3  . escitalopram (LEXAPRO) 10 MG tablet Take 10 mg by mouth daily.    . magnesium oxide (MAG-OX) 400 MG tablet TAKE 2 TABLETS EACH MORNING AND 1 TABLETEACH EVENING. 270 tablet 3  . metoprolol tartrate (LOPRESSOR) 50 MG tablet Take 50 mg by mouth 2 (two) times daily.    . metoprolol tartrate (LOPRESSOR) 50 MG tablet Take 50 mg by mouth 2 (two) times daily. 75    . Multiple Vitamin (MULTIVITAMIN WITH MINERALS) TABS tablet Take 1 tablet by mouth daily.    . Multiple Vitamins-Minerals (PRESERVISION AREDS 2 PO) Take 1 tablet by mouth 2 (two) times a day.    [name] MYRBETRIQ 50 MG TB24 tablet Take 50 mg by mouth daily.    [name] Glycol-Propyl Glycol (SYSTANE ULTRA) 0.4-0.3 % SOLN Place 1 drop into both eyes daily as needed (for dry eyes).     . potassium chloride SA (KLOR-CON) 20 MEQ tablet Take 1 tablet (20 mEq total) by mouth 2 (two)  times daily. (Patient taking differently: Take 20 mEq by mouth daily. ) 180 tablet 3  . sacubitril-valsartan (ENTRESTO) 24-26 MG Take 1 tablet by mouth 2 (two) times daily. 60 tablet 5  . tamsulosin (FLOMAX) 0.4 MG CAPS capsule Take 0.8 mg by mouth at bedtime.      No current facility-administered medications for this encounter.    Allergies  Allergen Reactions  .  Ace Inhibitors Cough  . Xarelto [Rivaroxaban] Other (See Comments)    Joint pain      Social History   Socioeconomic History  . Marital status: Married    Spouse name: Not on file  . Number of children: Not on file  . Years of education: Not on file  . Highest education level: Not on file  Occupational History  . Not on file  Tobacco Use  . Smoking status: Never Smoker  . Smokeless tobacco: Never Used  Vaping Use  . Vaping Use: Never used  Substance and Sexual Activity  . Alcohol use: No  . Drug use: No  . Sexual activity: Not Currently  Other Topics Concern  . 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 Difficulty of Paying Living Expenses: Not on file  Food Insecurity:   . Worried About Charity fundraiser in the Last Year: Not on file  . Ran Out of Food in the Last Year: Not on file  Transportation Needs:   . Lack of Transportation (Medical): Not on file  . Lack of Transportation (Non-Medical): Not on file  Physical Activity:   . Days of Exercise per Week: Not on file  . Minutes of Exercise per Session: Not on file  Stress:   . Feeling of Stress : Not on file  Social Connections:   . Frequency of Communication with Friends and Family: Not on file  . Frequency of Social Gatherings with Friends and Family: Not on file  . Attends Religious Services: Not on file  . Active Member of Clubs or Organizations: Not on file  . Attends Archivist  Meetings: Not on file  . Marital Status: Not on file  Intimate Partner Violence:   . Fear of Current or Ex-Partner: Not on file  . Emotionally Abused: Not on file  . Physically Abused: Not on file  . Sexually Abused: Not on file      Family History  Problem Relation Age of Onset  . Liver disease Mother   . Renal Disease Mother   . Stroke Mother   . Atrial fibrillation Father   . Cancer Sister     Vitals:   07/14/20 1441  BP: 108/60  Pulse: 76  SpO2: 97%  Weight: 127.3 kg (280 lb 9.6 oz)   Wt Readings from Last 3 Encounters:  07/14/20 127.3 kg (280 lb 9.6 oz)  06/09/20 126.6 kg (279 lb)  06/05/20 127.9 kg (282 lb)     PHYSICAL EXAM: General:  Obese male sitting in WC No respiratory difficulty HEENT: normal Neck: supple. JVP 5-6 Carotids 2+ bilat; no bruits. No lymphadenopathy or thryomegaly appreciated. Cor: PMI nondisplaced. Regular rate & rhythm. No rubs, gallops or murmurs. Lungs: clear Abdomen: obese soft, nontender, nondistended. No hepatosplenomegaly. No bruits or masses. Good bowel sounds. Extremities: no cyanosis, clubbing, rash, no edema on R. Left leg mild edema, chronically enlarged Neuro: alert & oriented x 3, cranial nerves grossly intact. moves all 4 extremities w/o difficulty. Affect pleasant.    ASSESSMENT & PLAN:  1. Chronic dyspnea - suspect this is multifactorial (HF, AF, obesity, deconditioning, restrictive lung physiology) - currently volume status looks quite good and doubt HF is major driver here. Suspect mostly due to his weight and deconditioning - we discussed options of 1. No  change 2. Try to be more active (walking 5-26mins 2-3x/day) and reassess in 2-3 months 3. RHC 4. CPX testing - his wife feels he is too deconditioned to walk on TM or ride bike currently - he has decided to try and be more active and reasses in 2-3 months  2. Chronic diastolic HF - Echo 0/37 EF 60 to 65% with severe LVH.  The septum was 2.5 cm in the posterior  wall 2.1 cm.RV ok - PYP 10/21 negative - Volume status looks good on current regimen - will add Jardiance based on recent [place] preserved data. May need to cut lasix back to 40 daily   3. Permanent AF -  permanent AF s/p AVN ablation in 5/20 (unable to get CHB) and CRT, - followed by Dr. Lovena Le - rates improved on digoxin - On Eliquis. No bleeding  4. OSA on CPAP - compliant with CPAP  5. Restrictive lung disease - PFTs 5/21: FEV1 1.66 (59%) FVC 2.28 (59%) DLCO 69%  5. Morbid obesity - s/p gastric bypass - Body mass index is 42.67 kg/m.  - would likely benefit significantly from weight loss  Glori Bickers, MD  3:05 PM

## 2020-07-14 ENCOUNTER — Encounter (HOSPITAL_COMMUNITY): Payer: Self-pay | Admitting: Internal Medicine

## 2020-07-14 ENCOUNTER — Ambulatory Visit (HOSPITAL_COMMUNITY)
Admission: RE | Admit: 2020-07-14 | Discharge: 2020-07-14 | Disposition: A | Discharge status: Home / Self Care | Payer: Medicare Other | Source: Ambulatory Visit | Attending: Internal Medicine | Admitting: Internal Medicine

## 2020-07-14 ENCOUNTER — Other Ambulatory Visit: Payer: Self-pay

## 2020-07-14 ENCOUNTER — Telehealth (HOSPITAL_COMMUNITY): Payer: Self-pay | Admitting: Pharmacy Technician

## 2020-07-14 VITALS — BP 108/60 | HR 76 | Wt 280.6 lb

## 2020-07-14 DIAGNOSIS — J984 Other disorders of lung: Secondary | ICD-10-CM | POA: Diagnosis not present

## 2020-07-14 DIAGNOSIS — R0609 Other forms of dyspnea: Secondary | ICD-10-CM | POA: Diagnosis present

## 2020-07-14 DIAGNOSIS — Z79899 Other long term (current) drug therapy: Secondary | ICD-10-CM | POA: Diagnosis not present

## 2020-07-14 DIAGNOSIS — Z9884 Bariatric surgery status: Secondary | ICD-10-CM | POA: Insufficient documentation

## 2020-07-14 DIAGNOSIS — Z950 Presence of cardiac pacemaker: Secondary | ICD-10-CM | POA: Diagnosis not present

## 2020-07-14 DIAGNOSIS — G4733 Obstructive sleep apnea (adult) (pediatric): Secondary | ICD-10-CM | POA: Diagnosis not present

## 2020-07-14 DIAGNOSIS — Z7901 Long term (current) use of anticoagulants: Secondary | ICD-10-CM | POA: Insufficient documentation

## 2020-07-14 DIAGNOSIS — Z6841 Body Mass Index (BMI) 40.0 and over, adult: Secondary | ICD-10-CM | POA: Diagnosis not present

## 2020-07-14 DIAGNOSIS — I5032 Chronic diastolic (congestive) heart failure: Secondary | ICD-10-CM | POA: Diagnosis not present

## 2020-07-14 DIAGNOSIS — I4821 Permanent atrial fibrillation: Secondary | ICD-10-CM | POA: Diagnosis not present

## 2020-07-14 DIAGNOSIS — R0602 Shortness of breath: Secondary | ICD-10-CM

## 2020-07-14 DIAGNOSIS — I110 Hypertensive heart disease with heart failure: Secondary | ICD-10-CM | POA: Diagnosis not present

## 2020-07-14 DIAGNOSIS — I4819 Other persistent atrial fibrillation: Secondary | ICD-10-CM | POA: Diagnosis not present

## 2020-07-14 HISTORY — DX: Heart failure, unspecified: I50.9

## 2020-07-14 MED ORDER — EMPAGLIFLOZIN 10 MG PO TABS
10.0000 mg | ORAL_TABLET | Freq: Every day | ORAL | 6 refills | Status: DC
Start: 2020-07-14 — End: 2020-09-11

## 2020-07-14 NOTE — Telephone Encounter (Signed)
Patient was started on Jardiance in clinic today. Current 30 day co-pay is $47.  Called and left message to ensure patient can afford co-pay.  Charlann Boxer, CPhT

## 2020-07-14 NOTE — Patient Instructions (Signed)
Start Jardiance 10mg  (1 tab) daily  Your physician recommends that you schedule a follow-up appointment in: 61months  Your provider has prescribed Jardiance for you. Please be aware the most common side effect of this medication is urinary tract infections and yeast infections. Please practice good hygiene and keep this area clean and dry to help prevent this. If you do begin to have symptoms of these infections, such as difficulty urinating or painful urination,  please let us know.  If you have any questions or concerns before your next appointment please send [place] a message through [place] or call our office at [phone].    TO LEAVE A MESSAGE FOR THE NURSE SELECT OPTION 2, PLEASE LEAVE A MESSAGE INCLUDING: . YOUR NAME . DATE OF BIRTH . CALL BACK NUMBER . REASON FOR CALL**this is important as we prioritize the call backs  [place] AS LONG AS YOU CALL BEFORE 4:00 PM  At the [facility], you and your health needs are our priority. As part of our continuing mission to provide you with exceptional heart care, we have created designated Provider Care Teams. These Care Teams include your primary Cardiologist (physician) and Advanced Practice Providers (APPs- Physician Assistants and Nurse Practitioners) who all work together to provide you with the care you need, when you need it.   You may see any of the following providers on your designated Care Team at your next follow up: [name] Dr Glori Bickers . Dr [name] . Darrick Grinder, NP . Lyda Jester, PA . Audry Riles, PharmD   Please be sure to bring in all your medications bottles to every appointment.

## 2020-08-06 ENCOUNTER — Encounter (HOSPITAL_COMMUNITY): Payer: Self-pay

## 2020-08-11 ENCOUNTER — Other Ambulatory Visit: Payer: Self-pay

## 2020-08-11 ENCOUNTER — Ambulatory Visit (HOSPITAL_COMMUNITY)
Admission: RE | Admit: 2020-08-11 | Discharge: 2020-08-11 | Disposition: A | Discharge status: Home / Self Care | Payer: Medicare Other | Source: Ambulatory Visit | Attending: Internal Medicine | Admitting: Internal Medicine

## 2020-08-11 DIAGNOSIS — I5042 Chronic combined systolic (congestive) and diastolic (congestive) heart failure: Secondary | ICD-10-CM

## 2020-08-11 LAB — BASIC METABOLIC PANEL
Anion gap: 6 (ref 5–15)
BUN: 31 mg/dL — ABNORMAL HIGH (ref 8–23)
CO2: 19 mmol/L — ABNORMAL LOW (ref 22–32)
Calcium: 8.6 mg/dL — ABNORMAL LOW (ref 8.9–10.3)
Chloride: 117 mmol/L — ABNORMAL HIGH (ref 98–111)
Creatinine, Ser: 1.61 mg/dL — ABNORMAL HIGH (ref 0.61–1.24)
GFR, Estimated: 44 mL/min — ABNORMAL LOW (ref >60)
Glucose, Bld: 90 mg/dL (ref 70–99)
Potassium: 5 mmol/L (ref 3.5–5.1)
Sodium: 142 mmol/L (ref 135–145)

## 2020-08-21 ENCOUNTER — Ambulatory Visit (INDEPENDENT_AMBULATORY_CARE_PROVIDER_SITE_OTHER): Payer: Medicare Other

## 2020-08-21 DIAGNOSIS — I442 Atrioventricular block, complete: Secondary | ICD-10-CM | POA: Diagnosis not present

## 2020-08-21 LAB — CUP PACEART REMOTE DEVICE CHECK
Battery Remaining Longevity: 174 mo
Battery Voltage: 3.19 V
Brady Statistic AP VP Percent: 11.39 %
Brady Statistic AP VS Percent: 0.22 %
Brady Statistic AS VP Percent: 11.04 %
Brady Statistic AS VS Percent: 77.35 %
Brady Statistic RA Percent Paced: 14.05 %
Brady Statistic RV Percent Paced: 22.54 %
Date Time Interrogation Session: 20220105213003
Implantable Lead Implant Date: 20210707
Implantable Lead Implant Date: 20210707
Implantable Lead Location: 753859
Implantable Lead Location: 753860
Implantable Lead Model: 3830
Implantable Lead Model: 5076
Implantable Pulse Generator Implant Date: 20210707
Lead Channel Impedance Value: 285 Ohm
Lead Channel Impedance Value: 399 Ohm
Lead Channel Impedance Value: 399 Ohm
Lead Channel Impedance Value: 551 Ohm
Lead Channel Pacing Threshold Amplitude: 0.625 V
Lead Channel Pacing Threshold Pulse Width: 0.4 ms
Lead Channel Sensing Intrinsic Amplitude: 1 mV
Lead Channel Sensing Intrinsic Amplitude: 1 mV
Lead Channel Sensing Intrinsic Amplitude: 21.625 mV
Lead Channel Sensing Intrinsic Amplitude: 21.625 mV
Lead Channel Setting Pacing Amplitude: 2 V
Lead Channel Setting Pacing Amplitude: 2 V
Lead Channel Setting Pacing Pulse Width: 0.4 ms
Lead Channel Setting Sensing Sensitivity: 1.2 mV

## 2020-08-30 ENCOUNTER — Observation Stay (HOSPITAL_COMMUNITY): Payer: Medicare Other

## 2020-08-30 ENCOUNTER — Emergency Department (HOSPITAL_COMMUNITY): Payer: Medicare Other

## 2020-08-30 ENCOUNTER — Other Ambulatory Visit: Payer: Self-pay

## 2020-08-30 ENCOUNTER — Inpatient Hospital Stay (HOSPITAL_COMMUNITY)
Admission: EM | Admit: 2020-08-30 | Discharge: 2020-09-05 | DRG: 871 | Disposition: A | Discharge status: Home / Self Care | Payer: Medicare Other | Attending: Internal Medicine | Admitting: Internal Medicine

## 2020-08-30 ENCOUNTER — Encounter (HOSPITAL_COMMUNITY): Payer: Self-pay | Admitting: Emergency Medicine

## 2020-08-30 DIAGNOSIS — I5033 Acute on chronic diastolic (congestive) heart failure: Secondary | ICD-10-CM | POA: Diagnosis not present

## 2020-08-30 DIAGNOSIS — E872 Acidosis, unspecified: Secondary | ICD-10-CM | POA: Diagnosis present

## 2020-08-30 DIAGNOSIS — I10 Essential (primary) hypertension: Secondary | ICD-10-CM | POA: Diagnosis not present

## 2020-08-30 DIAGNOSIS — K649 Unspecified hemorrhoids: Secondary | ICD-10-CM | POA: Diagnosis not present

## 2020-08-30 DIAGNOSIS — I4819 Other persistent atrial fibrillation: Secondary | ICD-10-CM | POA: Diagnosis not present

## 2020-08-30 DIAGNOSIS — A419 Sepsis, unspecified organism: Secondary | ICD-10-CM | POA: Diagnosis not present

## 2020-08-30 DIAGNOSIS — D6832 Hemorrhagic disorder due to extrinsic circulating anticoagulants: Secondary | ICD-10-CM | POA: Diagnosis not present

## 2020-08-30 DIAGNOSIS — E875 Hyperkalemia: Secondary | ICD-10-CM | POA: Diagnosis not present

## 2020-08-30 DIAGNOSIS — N179 Acute kidney failure, unspecified: Secondary | ICD-10-CM | POA: Diagnosis present

## 2020-08-30 DIAGNOSIS — Z950 Presence of cardiac pacemaker: Secondary | ICD-10-CM

## 2020-08-30 DIAGNOSIS — E869 Volume depletion, unspecified: Secondary | ICD-10-CM | POA: Diagnosis present

## 2020-08-30 DIAGNOSIS — Z743 Need for continuous supervision: Secondary | ICD-10-CM | POA: Diagnosis not present

## 2020-08-30 DIAGNOSIS — J984 Other disorders of lung: Secondary | ICD-10-CM | POA: Diagnosis present

## 2020-08-30 DIAGNOSIS — K819 Cholecystitis, unspecified: Secondary | ICD-10-CM | POA: Diagnosis not present

## 2020-08-30 DIAGNOSIS — F32A Depression, unspecified: Secondary | ICD-10-CM | POA: Diagnosis present

## 2020-08-30 DIAGNOSIS — D631 Anemia in chronic kidney disease: Secondary | ICD-10-CM | POA: Diagnosis present

## 2020-08-30 DIAGNOSIS — R404 Transient alteration of awareness: Secondary | ICD-10-CM | POA: Diagnosis not present

## 2020-08-30 DIAGNOSIS — I5032 Chronic diastolic (congestive) heart failure: Secondary | ICD-10-CM | POA: Diagnosis not present

## 2020-08-30 DIAGNOSIS — Z9884 Bariatric surgery status: Secondary | ICD-10-CM

## 2020-08-30 DIAGNOSIS — I442 Atrioventricular block, complete: Secondary | ICD-10-CM | POA: Diagnosis present

## 2020-08-30 DIAGNOSIS — Z85831 Personal history of malignant neoplasm of soft tissue: Secondary | ICD-10-CM

## 2020-08-30 DIAGNOSIS — R778 Other specified abnormalities of plasma proteins: Secondary | ICD-10-CM | POA: Diagnosis not present

## 2020-08-30 DIAGNOSIS — A4150 Gram-negative sepsis, unspecified: Principal | ICD-10-CM | POA: Diagnosis present

## 2020-08-30 DIAGNOSIS — R319 Hematuria, unspecified: Secondary | ICD-10-CM | POA: Diagnosis not present

## 2020-08-30 DIAGNOSIS — Z841 Family history of disorders of kidney and ureter: Secondary | ICD-10-CM

## 2020-08-30 DIAGNOSIS — I130 Hypertensive heart and chronic kidney disease with heart failure and stage 1 through stage 4 chronic kidney disease, or unspecified chronic kidney disease: Secondary | ICD-10-CM | POA: Diagnosis not present

## 2020-08-30 DIAGNOSIS — I959 Hypotension, unspecified: Secondary | ICD-10-CM | POA: Diagnosis not present

## 2020-08-30 DIAGNOSIS — Z888 Allergy status to other drugs, medicaments and biological substances status: Secondary | ICD-10-CM | POA: Diagnosis not present

## 2020-08-30 DIAGNOSIS — Z79899 Other long term (current) drug therapy: Secondary | ICD-10-CM

## 2020-08-30 DIAGNOSIS — G4733 Obstructive sleep apnea (adult) (pediatric): Secondary | ICD-10-CM | POA: Diagnosis present

## 2020-08-30 DIAGNOSIS — N1831 Chronic kidney disease, stage 3a: Secondary | ICD-10-CM | POA: Diagnosis present

## 2020-08-30 DIAGNOSIS — Z8711 Personal history of peptic ulcer disease: Secondary | ICD-10-CM | POA: Diagnosis not present

## 2020-08-30 DIAGNOSIS — R079 Chest pain, unspecified: Secondary | ICD-10-CM | POA: Diagnosis not present

## 2020-08-30 DIAGNOSIS — I499 Cardiac arrhythmia, unspecified: Secondary | ICD-10-CM | POA: Diagnosis not present

## 2020-08-30 DIAGNOSIS — I4821 Permanent atrial fibrillation: Secondary | ICD-10-CM | POA: Diagnosis present

## 2020-08-30 DIAGNOSIS — M159 Polyosteoarthritis, unspecified: Secondary | ICD-10-CM | POA: Diagnosis present

## 2020-08-30 DIAGNOSIS — K828 Other specified diseases of gallbladder: Secondary | ICD-10-CM | POA: Diagnosis not present

## 2020-08-30 DIAGNOSIS — Z8546 Personal history of malignant neoplasm of prostate: Secondary | ICD-10-CM

## 2020-08-30 DIAGNOSIS — R0600 Dyspnea, unspecified: Secondary | ICD-10-CM | POA: Diagnosis not present

## 2020-08-30 DIAGNOSIS — N390 Urinary tract infection, site not specified: Secondary | ICD-10-CM | POA: Diagnosis present

## 2020-08-30 DIAGNOSIS — Z7901 Long term (current) use of anticoagulants: Secondary | ICD-10-CM

## 2020-08-30 DIAGNOSIS — Z823 Family history of stroke: Secondary | ICD-10-CM

## 2020-08-30 DIAGNOSIS — R6889 Other general symptoms and signs: Secondary | ICD-10-CM | POA: Diagnosis not present

## 2020-08-30 DIAGNOSIS — Z20822 Contact with and (suspected) exposure to covid-19: Secondary | ICD-10-CM | POA: Diagnosis not present

## 2020-08-30 DIAGNOSIS — K8020 Calculus of gallbladder without cholecystitis without obstruction: Secondary | ICD-10-CM | POA: Diagnosis not present

## 2020-08-30 DIAGNOSIS — I517 Cardiomegaly: Secondary | ICD-10-CM | POA: Diagnosis not present

## 2020-08-30 DIAGNOSIS — R0789 Other chest pain: Secondary | ICD-10-CM | POA: Diagnosis present

## 2020-08-30 DIAGNOSIS — K4020 Bilateral inguinal hernia, without obstruction or gangrene, not specified as recurrent: Secondary | ICD-10-CM | POA: Diagnosis not present

## 2020-08-30 LAB — CBC
HCT: 36.7 % — ABNORMAL LOW (ref 39.0–52.0)
Hemoglobin: 10.9 g/dL — ABNORMAL LOW (ref 13.0–17.0)
MCH: 28 pg (ref 26.0–34.0)
MCHC: 29.7 g/dL — ABNORMAL LOW (ref 30.0–36.0)
MCV: 94.3 fL (ref 80.0–100.0)
Platelets: 189 10*3/uL (ref 150–400)
RBC: 3.89 MIL/uL — ABNORMAL LOW (ref 4.22–5.81)
RDW: 15.8 % — ABNORMAL HIGH (ref 11.5–15.5)
WBC: 14.4 10*3/uL — ABNORMAL HIGH (ref 4.0–10.5)
nRBC: 0 % (ref 0.0–0.2)

## 2020-08-30 LAB — PROTIME-INR
INR: 1.3 — ABNORMAL HIGH (ref 0.8–1.2)
Prothrombin Time: 16 seconds — ABNORMAL HIGH (ref 11.4–15.2)

## 2020-08-30 LAB — URINALYSIS, MICROSCOPIC (REFLEX): Squamous Epithelial / HPF: NONE SEEN (ref 0–5)

## 2020-08-30 LAB — RENAL FUNCTION PANEL
Albumin: 2.7 g/dL — ABNORMAL LOW (ref 3.5–5.0)
Anion gap: 7 (ref 5–15)
BUN: 28 mg/dL — ABNORMAL HIGH (ref 8–23)
CO2: 16 mmol/L — ABNORMAL LOW (ref 22–32)
Calcium: 8 mg/dL — ABNORMAL LOW (ref 8.9–10.3)
Chloride: 112 mmol/L — ABNORMAL HIGH (ref 98–111)
Creatinine, Ser: 1.6 mg/dL — ABNORMAL HIGH (ref 0.61–1.24)
GFR, Estimated: 44 mL/min — ABNORMAL LOW (ref >60)
Glucose, Bld: 98 mg/dL (ref 70–99)
Phosphorus: 2.7 mg/dL (ref 2.5–4.6)
Potassium: 5.3 mmol/L — ABNORMAL HIGH (ref 3.5–5.1)
Sodium: 135 mmol/L (ref 135–145)

## 2020-08-30 LAB — COMPREHENSIVE METABOLIC PANEL
ALT: 36 U/L (ref 0–44)
AST: 23 U/L (ref 15–41)
Albumin: 3.2 g/dL — ABNORMAL LOW (ref 3.5–5.0)
Alkaline Phosphatase: 123 U/L (ref 38–126)
Anion gap: 6 (ref 5–15)
BUN: 24 mg/dL — ABNORMAL HIGH (ref 8–23)
CO2: 19 mmol/L — ABNORMAL LOW (ref 22–32)
Calcium: 8.8 mg/dL — ABNORMAL LOW (ref 8.9–10.3)
Chloride: 113 mmol/L — ABNORMAL HIGH (ref 98–111)
Creatinine, Ser: 1.43 mg/dL — ABNORMAL HIGH (ref 0.61–1.24)
GFR, Estimated: 50 mL/min — ABNORMAL LOW (ref >60)
Glucose, Bld: 108 mg/dL — ABNORMAL HIGH (ref 70–99)
Potassium: 5.8 mmol/L — ABNORMAL HIGH (ref 3.5–5.1)
Sodium: 138 mmol/L (ref 135–145)
Total Bilirubin: 0.8 mg/dL (ref 0.3–1.2)
Total Protein: 5.8 g/dL — ABNORMAL LOW (ref 6.5–8.1)

## 2020-08-30 LAB — RESP PANEL BY RT-PCR (FLU A&B, COVID) ARPGX2
Influenza A by PCR: NEGATIVE
Influenza B by PCR: NEGATIVE
SARS Coronavirus 2 by RT PCR: NEGATIVE

## 2020-08-30 LAB — URINALYSIS, ROUTINE W REFLEX MICROSCOPIC
Bilirubin Urine: NEGATIVE
Glucose, UA: NEGATIVE mg/dL
Ketones, ur: NEGATIVE mg/dL
Nitrite: POSITIVE — AB
Protein, ur: 30 mg/dL — AB
Specific Gravity, Urine: 1.025 (ref 1.005–1.030)
pH: 5.5 (ref 5.0–8.0)

## 2020-08-30 LAB — LACTIC ACID, PLASMA
Lactic Acid, Venous: 1.5 mmol/L (ref 0.5–1.9)
Lactic Acid, Venous: 3.4 mmol/L (ref 0.5–1.9)
Lactic Acid, Venous: 1.4 mmol/L (ref 0.5–1.9)

## 2020-08-30 LAB — TROPONIN I (HIGH SENSITIVITY)
Troponin I (High Sensitivity): 20 ng/L — ABNORMAL HIGH (ref <18)
Troponin I (High Sensitivity): 21 ng/L — ABNORMAL HIGH (ref <18)

## 2020-08-30 LAB — PROCALCITONIN: Procalcitonin: 0.99 ng/mL

## 2020-08-30 LAB — C-REACTIVE PROTEIN: CRP: 3.5 mg/dL — ABNORMAL HIGH (ref <1.0)

## 2020-08-30 MED ORDER — SACUBITRIL-VALSARTAN 24-26 MG PO TABS: 1.0000 | ORAL_TABLET | Freq: Two times a day (BID) | ORAL | Status: DC

## 2020-08-30 MED ORDER — METOPROLOL TARTRATE 50 MG PO TABS
75.0000 mg | ORAL_TABLET | Freq: Two times a day (BID) | ORAL | Status: DC
  Administered 2020-08-31: 75 mg via ORAL
  Filled 2020-08-30 (×2): qty 1

## 2020-08-30 MED ORDER — LACTATED RINGERS IV BOLUS (SEPSIS)
500.0000 mL | Freq: Once | INTRAVENOUS | Status: AC
  Administered 2020-08-30: 500 mL via INTRAVENOUS

## 2020-08-30 MED ORDER — ESCITALOPRAM OXALATE 10 MG PO TABS
10.0000 mg | ORAL_TABLET | Freq: Every day | ORAL | Status: DC
Start: 2020-08-31 — End: 2020-09-05
  Administered 2020-08-31 – 2020-09-05 (×6): 10 mg via ORAL
  Filled 2020-08-30 (×6): qty 1

## 2020-08-30 MED ORDER — ONDANSETRON HCL 4 MG PO TABS: 4.0000 mg | ORAL_TABLET | Freq: Four times a day (QID) | ORAL | Status: DC | PRN

## 2020-08-30 MED ORDER — MIRABEGRON ER 50 MG PO TB24
50.0000 mg | ORAL_TABLET | Freq: Every day | ORAL | Status: DC
  Administered 2020-08-31 – 2020-09-05 (×6): 50 mg via ORAL
  Filled 2020-08-30 (×7): qty 1

## 2020-08-30 MED ORDER — APIXABAN 5 MG PO TABS
5.0000 mg | ORAL_TABLET | Freq: Two times a day (BID) | ORAL | Status: DC
  Administered 2020-08-30 – 2020-09-03 (×8): 5 mg via ORAL
  Filled 2020-08-30 (×8): qty 1

## 2020-08-30 MED ORDER — LACTATED RINGERS IV SOLN: INTRAVENOUS | Status: DC

## 2020-08-30 MED ORDER — IBUPROFEN 800 MG PO TABS
800.0000 mg | ORAL_TABLET | Freq: Once | ORAL | Status: AC
  Administered 2020-08-30: 800 mg via ORAL
  Filled 2020-08-30 (×2): qty 1

## 2020-08-30 MED ORDER — ACETAMINOPHEN 325 MG PO TABS
650.0000 mg | ORAL_TABLET | Freq: Four times a day (QID) | ORAL | Status: DC | PRN
  Administered 2020-08-31 – 2020-09-03 (×2): 650 mg via ORAL
  Filled 2020-08-30 (×2): qty 2

## 2020-08-30 MED ORDER — LACTATED RINGERS IV BOLUS (SEPSIS)
1000.0000 mL | Freq: Once | INTRAVENOUS | Status: AC
  Administered 2020-08-30: 1000 mL via INTRAVENOUS

## 2020-08-30 MED ORDER — ACETAMINOPHEN 325 MG PO TABS
650.0000 mg | ORAL_TABLET | Freq: Once | ORAL | Status: AC | PRN
  Administered 2020-08-30: 650 mg via ORAL
  Filled 2020-08-30: qty 2

## 2020-08-30 MED ORDER — IPRATROPIUM-ALBUTEROL 0.5-2.5 (3) MG/3ML IN SOLN: 3.0000 mL | RESPIRATORY_TRACT | Status: DC | PRN

## 2020-08-30 MED ORDER — LACTATED RINGERS IV BOLUS (SEPSIS)
1000.0000 mL | Freq: Once | INTRAVENOUS | Status: DC
  Administered 2020-08-30: 1000 mL via INTRAVENOUS

## 2020-08-30 MED ORDER — POLYETHYLENE GLYCOL 3350 17 G PO PACK: 17.0000 g | PACK | Freq: Every day | ORAL | Status: DC | PRN

## 2020-08-30 MED ORDER — ONDANSETRON HCL 4 MG/2ML IJ SOLN: 4.0000 mg | Freq: Four times a day (QID) | INTRAMUSCULAR | Status: DC | PRN

## 2020-08-30 MED ORDER — DIGOXIN 125 MCG PO TABS
0.1250 mg | ORAL_TABLET | Freq: Every day | ORAL | Status: DC
  Administered 2020-08-31 – 2020-09-05 (×6): 0.125 mg via ORAL
  Filled 2020-08-30 (×6): qty 1

## 2020-08-30 MED ORDER — LACTATED RINGERS IV SOLN: INTRAVENOUS | Status: AC

## 2020-08-30 MED ORDER — PIPERACILLIN-TAZOBACTAM 3.375 G IVPB 30 MIN
3.3750 g | Freq: Once | INTRAVENOUS | Status: AC
  Administered 2020-08-30: 3.375 g via INTRAVENOUS
  Filled 2020-08-30: qty 50

## 2020-08-30 MED ORDER — IBUPROFEN 600 MG PO TABS: 800.0000 mg | ORAL_TABLET | Freq: Once | ORAL | Status: DC

## 2020-08-30 MED ORDER — BUPROPION HCL ER (XL) 150 MG PO TB24
300.0000 mg | ORAL_TABLET | Freq: Every day | ORAL | Status: DC
  Administered 2020-08-31 – 2020-09-05 (×6): 300 mg via ORAL
  Filled 2020-08-30 (×6): qty 2

## 2020-08-30 MED ORDER — SODIUM CHLORIDE 0.9 % IV SOLN
1.0000 g | INTRAVENOUS | Status: DC
  Administered 2020-08-30: 1 g via INTRAVENOUS
  Filled 2020-08-30: qty 10

## 2020-08-30 MED ORDER — TAMSULOSIN HCL 0.4 MG PO CAPS
0.8000 mg | ORAL_CAPSULE | Freq: Every day | ORAL | Status: DC
Start: 2020-08-30 — End: 2020-09-05
  Administered 2020-08-30 – 2020-09-04 (×6): 0.8 mg via ORAL
  Filled 2020-08-30 (×6): qty 2

## 2020-08-30 MED ORDER — PIPERACILLIN-TAZOBACTAM 3.375 G IVPB
3.3750 g | Freq: Three times a day (TID) | INTRAVENOUS | Status: DC
  Administered 2020-08-31 – 2020-09-05 (×15): 3.375 g via INTRAVENOUS
  Filled 2020-08-30 (×16): qty 50

## 2020-08-30 MED ORDER — ACETAMINOPHEN 650 MG RE SUPP: 650.0000 mg | Freq: Four times a day (QID) | RECTAL | Status: DC | PRN

## 2020-08-30 NOTE — ED Provider Notes (Signed)
[facility] EMERGENCY DEPARTMENT Provider Note   CSN: [id number] Arrival date & time: 08/30/20  1403     History Chief Complaint  Patient presents with  . Chest Pain    Eric Mayer is a 80 y.o. male.  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  He presents today, he has been taking his Eliquis, he states that yesterday he was his normal self other than being short of breath which he has been recently.  There is some element of his shortness of breath which is chronic secondary to his heart failure but today he felt chest discomfort, he felt like he was weak enough to pass out and very fatigued which is unusual for him and his shortness of breath was worsened.  He has received the COVID vaccination as well as the booster, he has not been coughing, he has not had diarrhea, he has had some blood in his urine and a feeling of dysuria occasionally.  He was told by the urology office to drink less water.  Because of low blood pressures over the last couple of weeks he talk to his cardiologist and they told him to drink more water, he does not know what to do.  His blood pressures have been ranging between 90 and [id number] systolic when he measures them at home over the last 3 weeks.  He was noted to have a temperature of 102.5 on arrival  Past Medical History:  Diagnosis Date  . Anemia 1980s X 1  . Arthritis    "hands, knees, hips, ankles" (07/31/2015)  . Atrial flutter ([place]) 07/31/2015  . CHF (congestive heart failure) ([place])   . Chronic diastolic heart failure ([place])    a. 11/2015: Echo w/ EF of 60-65%, no WMA, Grade 2 DD, trivial AR, ascending aorta mildly dilated.   . Depression   . History of cardiovascular  stress test    a. 03/2015: Dobutamine Stress Echo with no evidence of ischemia.   [name] History of gastric bypass   . History of peptic ulcer    1980's  . History of radiation therapy 1993   sarcoma of left groin, tx at [facility]  . History of sarcoma    1993  LEFT GOIN--  S/P SURGERY, RADIATION AND CHEMO IN [place]  . Hypertension   . Hypogonadism in male   . Incomplete right bundle branch block   . 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 Nocturia   . OSA on CPAP   . Persistent atrial fibrillation ([place])       . 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 Primary prostate adenocarcinoma ([place]) DX 07/08/14   Gleason 7,  stage T1c  . PVC's (premature ventricular contractions) 11/21/2015  . 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 Weakness of left leg    SECONDARY TO SURGICAL NERVE INJURY OF LEFT GOIN  . Wears glasses     Patient Active Problem List   Diagnosis Date Noted  . SIRS (systemic inflammatory response syndrome) ([place]) 08/30/2020  . Complete heart block ([place]) 06/03/2020  . Pacemaker  06/03/2020  . Atrial fibrillation with rapid ventricular response ([place]) 02/20/2020  . Persistent atrial fibrillation ([place]) 06/08/2018  . Allergic rhinitis 10/15/2016  . 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 Acute on chronic diastolic congestive heart failure ([place]) 07/21/2016  . Atrial fibrillation ([place]) 02/13/2016  . PVC's (premature ventricular contractions) 11/21/2015  . Chronic anticoagulation 08/04/2015  . Essential hypertension 08/04/2015  . Obesity 08/04/2015  . Sleep apnea-on C-Pap 08/04/2015  . Acute on chronic diastolic heart failure ([place]) 07/31/2015  . Atrial flutter, paroxysmal ([place]) 07/31/2015  . Primary osteoarthritis of right knee 12/17/2014  . Malignant neoplasm of prostate ([place]) 08/20/2014    Past Surgical History:  Procedure Laterality Date  . ATRIAL FIBRILLATION ABLATION N/A  06/08/2018   Procedure: ATRIAL FIBRILLATION ABLATION;  Surgeon: Thompson Grayer, MD;  Location: [facility] CV LAB;  Service: Cardiovascular;  Laterality: N/A;  . ATRIAL FIBRILLATION ABLATION N/A 12/20/2018   Procedure: ATRIAL FIBRILLATION ABLATION;  Surgeon: Thompson Grayer, MD;  Location: [facility] CV LAB;  Service: Cardiovascular;  Laterality: N/A;  . AV NODE ABLATION  02/20/2020  . AV NODE ABLATION N/A 02/20/2020   Procedure: AV NODE ABLATION;  Surgeon: Evans Lance, MD;  Location: [facility] LAB;  Service: Cardiovascular;  Laterality: N/A;  . CARDIAC CATHETERIZATION  08-12-2003  dr Tressia Miners turner   Normal coronary arteries, normal wall motion, no sig. abnormalities  . CARDIOVERSION N/A 08/04/2015   Procedure: CARDIOVERSION;  Surgeon: [name], MD;  Location: [facility];  Service: Cardiovascular;  Laterality: N/A;  . CARDIOVERSION N/A 10/28/2017   Procedure: CARDIOVERSION;  Surgeon: Larey Dresser, MD;  Location: [facility] ENDOSCOPY;  Service: Cardiovascular;  Laterality: N/A;  . CARDIOVERSION N/A 04/12/2018   Procedure: CARDIOVERSION;  Surgeon: Thayer Headings, MD;  Location: [facility] ENDOSCOPY;  Service: Cardiovascular;  Laterality: N/A;  . CARDIOVERSION N/A 07/26/2018   Procedure: CARDIOVERSION;  Surgeon: Larey Dresser, MD;  Location: [facility] ENDOSCOPY;  Service: Cardiovascular;  Laterality: N/A;  . CARDIOVERSION N/A 10/09/2018   Procedure: CARDIOVERSION;  Surgeon: Sueanne Margarita, MD;  Location: [facility] ENDOSCOPY;  Service: Cardiovascular;  Laterality: N/A;  . COLONOSCOPY  07-03-2002  . [place]  . FRACTURE SURGERY    . GROIN DISSECTION Left 1992   [place]   SARCOMA SURGERY  . INGUINAL HERNIA REPAIR Right 1950s?  . INSERT / REPLACE / REMOVE PACEMAKER  02/20/2020  . INTESTINAL BYPASS  1976   GASTRIC FOR OBESITY  . JOINT REPLACEMENT    . PACEMAKER IMPLANT N/A 02/20/2020   Procedure: PACEMAKER IMPLANT;  Surgeon: Evans Lance, MD;  Location: [facility] CV LAB;   Service: Cardiovascular;  Laterality: N/A;  . PATELLA FRACTURE SURGERY Left ~ 1995   "broke it twice; only had OR once"  . PROSTATE BIOPSY  06/2014  . RADIOACTIVE SEED IMPLANT N/A 10/17/2014   Procedure: RADIOACTIVE SEED IMPLANT    ;  Surgeon: Ailene Rud, MD;  Location: [facility];  Service: Urology;  Laterality: N/A;   70 SEEDS IMPLANTED   . TEE WITHOUT CARDIOVERSION N/A 10/28/2017   Procedure: TRANSESOPHAGEAL ECHOCARDIOGRAM (TEE);  Surgeon: Larey Dresser, MD;  Location: [facility] ENDOSCOPY;  Service: Cardiovascular;  Laterality: N/A;  . TONSILLECTOMY  1950s  . TOTAL KNEE ARTHROPLASTY Right 12/17/2014   Procedure: TOTAL KNEE ARTHROPLASTY;  Surgeon: Melrose Nakayama, MD;  Location: [facility];  Service: Orthopedics;  Laterality: Right;  . TRANSTHORACIC ECHOCARDIOGRAM  08-08-2003   moderate LVH/  ef 55-65%/  mild MR/  moderate LAE/  trivial TR/  trivial pericardial effusion posterior to the heart  . [place]  . VIDEO BRONCHOSCOPY Bilateral 09/21/2016   Procedure: VIDEO BRONCHOSCOPY WITHOUT FLUORO;  Surgeon: Collene Gobble, MD;  Location: [facility];  Service: Cardiopulmonary;  Laterality: Bilateral;       Family History  Problem Relation Age of Onset  . Liver disease Mother   . Renal Disease Mother   . Stroke Mother   . Atrial fibrillation Father   . Cancer Sister     Social History   Tobacco Use  . Smoking status: Never Smoker  . Smokeless tobacco: Never Used  Vaping Use  . Vaping Use: Never used  Substance Use Topics  . Alcohol use: No  . Drug use: No    Home Medications Prior to Admission medications   Medication Sig Start Date End Date Taking? Authorizing Provider  acetaminophen (TYLENOL) 500 MG tablet Take 1,000 mg by mouth every 6 (six) hours as needed for moderate pain or headache.    [provider]  apixaban (ELIQUIS) 5 MG TABS tablet Take 1 tablet (5 mg total) by mouth 2 (two) times daily. Resume with Sunday evening dose.  02/24/20   Shirley Friar, PA-C  bumetanide (BUMEX) 2 MG tablet TAKE 1 TABLET BY MOUTH DAILY AND AN EXTRA TABLET FOR WEIGHT GAIN OF 2 POUNDSDAILY OR 5 POUNDS WEEKLY 02/04/20   [name], MD  buPROPion (WELLBUTRIN XL) 300 MG 24 hr tablet Take 300 mg by mouth daily.     [provider]  Cholecalciferol (VITAMIN D3) 125 MCG (5000 UT) TABS Take 5,000 Units by mouth daily.     [provider]  clotrimazole (LOTRIMIN) 1 % cream Apply 1 application topically daily as needed (itching).     [provider]  cyanocobalamin (,VITAMIN B-12,) 1000 MCG/ML injection Inject 1,000 mcg into the muscle every 30 (thirty) days.    [provider]  digoxin (LANOXIN) 0.125 MG tablet Take 1 tablet (0.125 mg total) by mouth daily. 06/03/20   Evans Lance, MD  empagliflozin (JARDIANCE) 10 MG TABS tablet Take 1 tablet (10 mg total) by mouth daily before breakfast. 07/14/20   [name], [name], MD  escitalopram (LEXAPRO) 10 MG tablet Take 10 mg by mouth daily.    [provider]  magnesium oxide (MAG-OX) 400 MG tablet TAKE 2 TABLETS EACH MORNING AND 1 TABLETEACH EVENING. 04/18/20   Allred, Jeneen Rinks, MD  metoprolol tartrate (LOPRESSOR) 50 MG tablet Take 50 mg by mouth 2 (two) times daily. 03/20/20   [provider]  metoprolol tartrate (LOPRESSOR) 50 MG tablet Take 50 mg by mouth 2 (two) times daily. 75    [provider]  Multiple Vitamin (MULTIVITAMIN WITH MINERALS) TABS tablet Take 1 tablet by mouth daily.    [provider]  Multiple Vitamins-Minerals (PRESERVISION AREDS 2 PO) Take 1 tablet by mouth 2 (two) times a day.    [provider]  MYRBETRIQ 50 MG TB24 tablet Take 50 mg by mouth daily. 05/29/20   [provider]  Polyethyl Glycol-Propyl Glycol (SYSTANE ULTRA) 0.4-0.3 % SOLN Place 1 drop into both eyes daily as needed (for dry eyes).     [provider]  potassium chloride SA (KLOR-CON) 20 MEQ tablet Take 1  tablet (20 mEq total) by mouth 2 (two) times daily. Patient taking differently: Take 20 mEq by mouth daily.  09/18/19   Sueanne Margarita, MD  sacubitril-valsartan (ENTRESTO) 24-26 MG Take 1 tablet by  mouth 2 (two) times daily. 05/07/20   [name], MD  tamsulosin (FLOMAX) 0.4 MG CAPS capsule Take 0.8 mg by mouth at bedtime.  11/04/16   [provider]    Allergies    Ace inhibitors and Xarelto [rivaroxaban]  Review of Systems   Review of Systems  All other systems reviewed and are negative.   Physical Exam Updated Vital Signs BP (!) 88/76   Pulse 74   Temp (!) 102.9 F (39.4 C) (Oral)   Resp (!) 21   SpO2 95%   Physical Exam Constitutional:      Comments: The patient is in no distress but is tachypneic and becomes more tachypneic when he tries to get himself from the chair to the gurney  HENT:     Head: Normocephalic and atraumatic.     Nose:     Comments: Slight rhinorrhea, the patient states that is chronic    Mouth/Throat:     Mouth: Mucous membranes are moist.  Eyes:     Extraocular Movements: Extraocular movements intact.     Pupils: Pupils are equal, round, and reactive to light.  Neck:     Vascular: No JVD.  Cardiovascular:     Rate and Rhythm: Normal rate. Rhythm irregular.     Comments: The patient has normal pulses at the radial artery Pulmonary:     Comments: Tachypneic, speaks in just shortened sentences, no accessory muscle use, lungs are clear Abdominal:     Comments: Soft nontender abdomen  Musculoskeletal:     Cervical back: Normal range of motion and neck supple.     Comments: The patient has bilateral pitting edema of the legs, they appear symmetrical, he does have a bit of stasis dermatitis of the left lower extremity compared to the right and states this is chronic  Lymphadenopathy:     Cervical: No cervical adenopathy.  Skin:    Comments: Stasis dermatitis on the left leg  Neurological:     Comments: Awake alert and following  commands, has significant difficulty getting from the chair to the stretcher secondary to weakness and shortness of breath     ED Results / Procedures / Treatments   Labs (all labs ordered are listed, but only abnormal results are displayed) Labs Reviewed  CBC - Abnormal; Notable for the following components:      Result Value   WBC 14.4 (*)    RBC 3.89 (*)    Hemoglobin 10.9 (*)    HCT 36.7 (*)    MCHC 29.7 (*)    RDW 15.8 (*)    All other components within normal limits  COMPREHENSIVE METABOLIC PANEL - Abnormal; Notable for the following components:   Potassium 5.8 (*)    Chloride 113 (*)    CO2 19 (*)    Glucose, Bld 108 (*)    BUN 24 (*)    Creatinine, Ser 1.43 (*)    Calcium 8.8 (*)    Total Protein 5.8 (*)    Albumin 3.2 (*)    GFR, Estimated 50 (*)    All other components within normal limits  LACTIC ACID, PLASMA - Abnormal; Notable for the following components:   Lactic Acid, Venous 3.4 (*)    All other components within normal limits  URINALYSIS, ROUTINE W REFLEX MICROSCOPIC - Abnormal; Notable for the following components:   Hgb urine dipstick MODERATE (*)    Protein, ur 30 (*)    Nitrite POSITIVE (*)    Leukocytes,Ua MODERATE (*)  All other components within normal limits  PROTIME-INR - Abnormal; Notable for the following components:   Prothrombin Time 16.0 (*)    INR 1.3 (*)    All other components within normal limits  URINALYSIS, MICROSCOPIC (REFLEX) - Abnormal; Notable for the following components:   Bacteria, UA FEW (*)    All other components within normal limits  TROPONIN I (HIGH SENSITIVITY) - Abnormal; Notable for the following components:   Troponin I (High Sensitivity) 20 (*)    All other components within normal limits  TROPONIN I (HIGH SENSITIVITY) - Abnormal; Notable for the following components:   Troponin I (High Sensitivity) 21 (*)    All other components within normal limits  RESP PANEL BY RT-PCR (FLU A&B, COVID) ARPGX2  CULTURE,  BLOOD (ROUTINE X 2)  CULTURE, BLOOD (ROUTINE X 2)  URINE CULTURE  LACTIC ACID, PLASMA    EKG EKG Interpretation  Date/Time:  Saturday August 30 2020 14:22:01 EST Ventricular Rate:  67 PR Interval:    QRS Duration: 90 QT Interval:  380 QTC Calculation: 401 R Axis:   -74 Text Interpretation: Sinus rhythm with 2nd degree A-V block ([place] I) with frequent AV dual-paced complexes Low voltage QRS Left anterior fascicular block Cannot rule out Anterior infarct , age undetermined Abnormal ECG Confirmed by Lennice Sites [phone]) on 08/30/2020 2:36:40 PM   Radiology DG Chest 2 View  Result Date: 08/30/2020 CLINICAL DATA:  Chest pain and dyspnea EXAM: CHEST - 2 VIEW COMPARISON:  02/21/2020 chest radiograph. FINDINGS: Stable configuration of 2 lead left subclavian pacemaker. Stable cardiomediastinal silhouette with mild cardiomegaly. No pneumothorax. No pleural effusion. Lungs appear clear, with no acute consolidative airspace disease and no pulmonary edema. IMPRESSION: Stable mild cardiomegaly without pulmonary edema. No active pulmonary disease. Electronically Signed   By: Ilona Sorrel M.D.   On: 08/30/2020 15:00    Procedures .Critical Care Performed by: Noemi Chapel, MD Authorized by: Noemi Chapel, MD   Critical care provider statement:    Critical care time (minutes):  35   Critical care time was exclusive of:  Separately billable procedures and treating other patients and teaching time   Critical care was necessary to treat or prevent imminent or life-threatening deterioration of the following conditions:  Sepsis   Critical care was time spent personally by me on the following activities:  Blood draw for specimens, development of treatment plan with patient or surrogate, discussions with consultants, evaluation of patient's response to treatment, examination of patient, obtaining history from patient or surrogate, ordering and performing treatments and interventions, ordering and  review of laboratory studies, ordering and review of radiographic studies, pulse oximetry, re-evaluation of patient's condition and review of old charts   (including critical care time)  Medications Ordered in ED Medications  lactated ringers infusion ( Intravenous New Bag/Given 08/30/20 1741)  cefTRIAXone (ROCEPHIN) 1 g in sodium chloride 0.9 % 100 mL IVPB (0 g Intravenous Stopped 08/30/20 1719)  lactated ringers bolus 1,000 mL (1,000 mLs Intravenous New Bag/Given 08/30/20 1719)    And  lactated ringers bolus 1,000 mL (1,000 mLs Intravenous New Bag/Given 08/30/20 1936)    And  lactated ringers bolus 1,000 mL (has no administration in time range)  ibuprofen (ADVIL) tablet 800 mg (has no administration in time range)  acetaminophen (TYLENOL) tablet 650 mg (650 mg Oral Given 08/30/20 1437)  lactated ringers bolus 500 mL (0 mLs Intravenous Stopped 08/30/20 1740)  ibuprofen (ADVIL) tablet 800 mg (800 mg Oral Given 08/30/20 1850)    ED  Course  I have reviewed the triage vital signs and the nursing notes.  Pertinent labs & imaging results that were available during my care of the patient were reviewed by me and considered in my medical decision making (see chart for details).  Clinical Course as of 08/30/20 1943  Sat Aug 30, 2020  [id number] Urinalysis pending, lactic acid was 3.4, he has had a couple of mean arterial pressures which were less than 60 thus he was given 30 cc/kg of IV fluids.  The patient is critically ill with what appears to be sepsis, no definite source though he has had some hematuria and I suspect this is urinary.  If not the patient will still need to be admitted given fever and leukocytosis [BM]    Clinical Course User Index [BM] Noemi Chapel, MD   MDM Rules/Calculators/A&P                          The patient's EKG is rather unremarkable, he has a pacemaker which appears to be working, he is febrile to 102.5 making his prehospital blood pressure in the 90s more concerning.   That being said he has been hypotensive for several weeks.  He does have a white blood cell count of 14,400 and with the presence of a white blood cell count and a fever it does raise concern for sepsis.  There is no obvious source at this point though he does have some hematuria and dysuria.  We will activate a code sepsis, check a urinalysis with a culture, add on a COVID swab and blood cultures, the patient is agreeable to the plan, his EKG and troponin do not suggest an ischemic cause of the chest pain that he had earlier today.  Of note he does not longer have any chest pain at this time  D/w Dr. Marlyce Huge who will admit,  Final Clinical Impression(s) / ED Diagnoses Final diagnoses:  Sepsis, due to unspecified organism, unspecified whether acute organ dysfunction present [facility])      Noemi Chapel, MD 08/30/20 1949

## 2020-08-30 NOTE — Progress Notes (Signed)
Pt admitted to 3e30 in NAD. BP low (80/40, see flowsheets), however consistent with previous readings and pt continually asymptomatic. Pt attached to monitor with Q1H BP checks. Pt oriented to unit and call bell. Falls precautions in place.

## 2020-08-30 NOTE — ED Notes (Signed)
Unsuccessful attempt to give report x 2

## 2020-08-30 NOTE — ED Notes (Signed)
Per MD, urine was at bedside to be collected. This NT marked UA and Urine Culture as complete, however when this NT arrived in pts room, pts visitor emptied urinal without notifying staff. UA collected, Urine Culture marked as complete however has not been collected. RN aware.

## 2020-08-30 NOTE — H&P (Signed)
History and Physical    Eric Mayer [id number] DOB: 1941-07-24 DOA: 08/30/2020  PCP: [name], MD  Patient coming from: Home via EMS   Chief Complaint:  Chief Complaint  Patient presents with  . Chest Pain     HPI:    80 year old male with past medical history of gastric bypass (1980's), prostate cancer (2016), intrinsic asthma  (PFT's 12/2019 comfirmed obstructive disease), diastolic congestive heart failure (Echo 04/2020 EF 60-65%), hypertension, obstructive sleep apnea, permanent atrial fibrillation/flutter (S/P ablation x 2, pacemaker placement 02/2020), chronic kidney disease stage IIIa who presents to [facility] emergency department via EMS with multiple complaints including dysuria, hematuria, chest discomfort and complaints of low blood pressure.  Patient explains that for approximate the past 4 weeks he has noticed that his daily blood pressures have been much lower than usual.  Review of notes in epic revealed that patient has been experiencing blood pressures as low as the [id number] systolic as early as [id number].  Has been associated with poor appetite and generalized weakness.  In the weeks that followed, patient's weakness and poor appetite persisted.  Symptoms persisted over the next several weeks.  Approximately 3 days ago the patient also began to develop lower abdominal pain, mild to moderate intensity, dull in quality.  This was associated with bouts of hematuria and dysuria.  Patient denies any associated fevers, nausea or vomiting.  Patient denies any sick contacts, recent travel or confirmed contacts with COVID-19 infection.    The morning of 1/15, the patient awoke from sleep with chest discomfort.  This chest discomfort was located in the left chest, dull in quality, mild in intensity.  Due to the constellation of symptoms noted above as well as the chest discomfort the patient experienced this morning, EMS was contacted and the patient was promptly brought  into [facility] emergency department for evaluation.    Upon evaluation in the emergency department patient's been found to be hypotensive.  Patient is also been found to exhibit multiple SIRS criteria including leukocytosis and fever.  Furthermore, urinalysis has been suggestive of a urinary tract infection.  Patient has also been found to have a lactic acidosis with a lactic acid of 3.4.  Patient was felt to be suffering from sepsis and has been bolused with 3.5 L of lactated Ringer solution.  Patient has been initiated on intravenous ceftriaxone.  Patient has been found to be COVID-negative.  Chest x-ray reveals no evidence of pneumonia.  The hospitalist group is now been called to assess the patient for admission to the hospital.  Review of Systems:   Review of Systems  Constitutional: Positive for malaise/fatigue.  Gastrointestinal: Positive for abdominal pain.  Genitourinary: Positive for dysuria and hematuria.  Neurological: Positive for weakness.  All other systems reviewed and are negative.   Past Medical History:  Diagnosis Date  . Anemia 1980s X 1  . Arthritis    "hands, knees, hips, ankles" (07/31/2015)  . Atrial flutter ([place]) 07/31/2015  . CHF (congestive heart failure) ([place])   . Chronic diastolic heart failure ([place])    a. 11/2015: Echo w/ EF of 60-65%, no WMA, Grade 2 DD, trivial AR, ascending aorta mildly dilated.   . Depression   . History of cardiovascular stress test    a. 03/2015: Dobutamine Stress Echo with no evidence of ischemia.   [name] History of gastric bypass   . History of peptic ulcer    1980's  . History of radiation therapy 1993  sarcoma of left groin, tx at [facility]  . History of sarcoma    1993  LEFT GOIN--  S/P SURGERY, RADIATION AND CHEMO IN [place]  . Hypertension   . Hypogonadism in male   . Incomplete right bundle branch block   . Nerve injury    SURGICAL NERVE INJURY S/P  LEFT GOIN REMOVAL SARCOMA 1992--  RESIDUAL WEAKNESS AND  NUMBNESS UPPER LEFT LEG  . Nocturia   . OSA on CPAP   . Persistent atrial fibrillation ([place])       . 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 Primary prostate adenocarcinoma ([place]) DX 07/08/14   Gleason 7,  stage T1c  . PVC's (premature ventricular contractions) 11/21/2015  . Sarcoma ([place]) ~ 1993/1994   "of groin"  . Weakness of left leg    SECONDARY TO SURGICAL NERVE INJURY OF LEFT GOIN  . Wears glasses     Past Surgical History:  Procedure Laterality Date  . ATRIAL FIBRILLATION ABLATION N/A 06/08/2018   Procedure: ATRIAL FIBRILLATION ABLATION;  Surgeon: Thompson Grayer, MD;  Location: [place] CV LAB;  Service: Cardiovascular;  Laterality: N/A;  . ATRIAL FIBRILLATION ABLATION N/A 12/20/2018   Procedure: ATRIAL FIBRILLATION ABLATION;  Surgeon: Thompson Grayer, MD;  Location: [place] CV LAB;  Service: Cardiovascular;  Laterality: N/A;  . AV NODE ABLATION  02/20/2020  . AV NODE ABLATION N/A 02/20/2020   Procedure: AV NODE ABLATION;  Surgeon: Evans Lance, MD;  Location: [place] CV LAB;  Service: Cardiovascular;  Laterality: N/A;  . CARDIAC CATHETERIZATION  08-12-2003  dr Tressia Miners turner   Normal coronary arteries, normal wall motion, no sig. abnormalities  . CARDIOVERSION N/A 08/04/2015   Procedure: CARDIOVERSION;  Surgeon: [name], MD;  Location: [place];  Service: Cardiovascular;  Laterality: N/A;  . CARDIOVERSION N/A 10/28/2017   Procedure: CARDIOVERSION;  Surgeon: Larey Dresser, MD;  Location: [facility] ENDOSCOPY;  Service: Cardiovascular;  Laterality: N/A;  . CARDIOVERSION N/A 04/12/2018   Procedure: CARDIOVERSION;  Surgeon: Thayer Headings, MD;  Location: [facility] ENDOSCOPY;  Service: Cardiovascular;  Laterality: N/A;  . CARDIOVERSION N/A 07/26/2018   Procedure: CARDIOVERSION;  Surgeon: Larey Dresser, MD;  Location: [facility] ENDOSCOPY;  Service: Cardiovascular;  Laterality: N/A;  . CARDIOVERSION N/A 10/09/2018   Procedure: CARDIOVERSION;  Surgeon: Sueanne Margarita,  MD;  Location: [facility] ENDOSCOPY;  Service: Cardiovascular;  Laterality: N/A;  . COLONOSCOPY  07-03-2002  . [place]  . FRACTURE SURGERY    . GROIN DISSECTION Left 1992   [place]   SARCOMA SURGERY  . INGUINAL HERNIA REPAIR Right 1950s?  . INSERT / REPLACE / REMOVE PACEMAKER  02/20/2020  . INTESTINAL BYPASS  1976   GASTRIC FOR OBESITY  . JOINT REPLACEMENT    . PACEMAKER IMPLANT N/A 02/20/2020   Procedure: PACEMAKER IMPLANT;  Surgeon: Evans Lance, MD;  Location: [place] CV LAB;  Service: Cardiovascular;  Laterality: N/A;  . PATELLA FRACTURE SURGERY Left ~ 1995   "broke it twice; only had OR once"  . PROSTATE BIOPSY  06/2014  . RADIOACTIVE SEED IMPLANT N/A 10/17/2014   Procedure: RADIOACTIVE SEED IMPLANT    ;  Surgeon: Ailene Rud, MD;  Location: [facility];  Service: Urology;  Laterality: N/A;   70 SEEDS IMPLANTED   . TEE WITHOUT CARDIOVERSION N/A 10/28/2017   Procedure: TRANSESOPHAGEAL ECHOCARDIOGRAM (TEE);  Surgeon: Larey Dresser, MD;  Location: [facility] ENDOSCOPY;  Service: Cardiovascular;  Laterality: N/A;  . TONSILLECTOMY  1950s  . TOTAL KNEE ARTHROPLASTY Right 12/17/2014   Procedure: TOTAL KNEE ARTHROPLASTY;  Surgeon: Melrose Nakayama, MD;  Location: [place];  Service: Orthopedics;  Laterality: Right;  . TRANSTHORACIC ECHOCARDIOGRAM  08-08-2003   moderate LVH/  ef 55-65%/  mild MR/  moderate LAE/  trivial TR/  trivial pericardial effusion posterior to the heart  . [place]  . VIDEO BRONCHOSCOPY Bilateral 09/21/2016   Procedure: VIDEO BRONCHOSCOPY WITHOUT FLUORO;  Surgeon: Collene Gobble, MD;  Location: [place];  Service: Cardiopulmonary;  Laterality: Bilateral;     reports that he has never smoked. He has never used smokeless tobacco. He reports that he does not drink alcohol and does not use drugs.  Allergies  Allergen Reactions  . Ace Inhibitors Cough  . Xarelto [Rivaroxaban] Other (See Comments)    Joint pain     Family History  Problem Relation Age of Onset  . Liver disease Mother   . Renal Disease Mother   . Stroke Mother   . Atrial fibrillation Father   . Cancer Sister      Prior to Admission medications   Medication Sig Start Date End Date Taking? Authorizing Provider  acetaminophen (TYLENOL) 500 MG tablet Take 1,000 mg by mouth every 6 (six) hours as needed for moderate pain or headache.    [provider]  apixaban (ELIQUIS) 5 MG TABS tablet Take 1 tablet (5 mg total) by mouth 2 (two) times daily. Resume with Sunday evening dose. 02/24/20   Shirley Friar, PA-C  bumetanide (BUMEX) 2 MG tablet TAKE 1 TABLET BY MOUTH DAILY AND AN EXTRA TABLET FOR WEIGHT GAIN OF 2 POUNDSDAILY OR 5 POUNDS WEEKLY 02/04/20   [name], MD  buPROPion (WELLBUTRIN XL) 300 MG 24 hr tablet Take 300 mg by mouth daily.     [provider]  Cholecalciferol (VITAMIN D3) 125 MCG (5000 UT) TABS Take 5,000 Units by mouth daily.     [provider]  clotrimazole (LOTRIMIN) 1 % cream Apply 1 application topically daily as needed (itching).     [provider]  cyanocobalamin (,VITAMIN B-12,) 1000 MCG/ML injection Inject 1,000 mcg into the muscle every 30 (thirty) days.    [provider]  digoxin (LANOXIN) 0.125 MG tablet Take 1 tablet (0.125 mg total) by mouth daily. 06/03/20   Evans Lance, MD  empagliflozin (JARDIANCE) 10 MG TABS tablet Take 1 tablet (10 mg total) by mouth daily before breakfast. 07/14/20   [name], [name], MD  escitalopram (LEXAPRO) 10 MG tablet Take 10 mg by mouth daily.    [provider]  magnesium oxide (MAG-OX) 400 MG tablet TAKE 2 TABLETS EACH MORNING AND 1 TABLETEACH EVENING. 04/18/20   Allred, Jeneen Rinks, MD  metoprolol tartrate (LOPRESSOR) 50 MG tablet Take 50 mg by mouth 2 (two) times daily. 03/20/20   [provider]  metoprolol tartrate (LOPRESSOR) 50 MG tablet Take 50 mg by mouth 2 (two) times daily. 75    [provider]  Multiple Vitamin (MULTIVITAMIN WITH MINERALS) TABS tablet Take 1 tablet by mouth daily.    [provider]  Multiple Vitamins-Minerals (PRESERVISION AREDS 2 PO) Take 1 tablet by mouth 2 (two) times a day.    [provider]  MYRBETRIQ 50 MG TB24 tablet Take 50 mg by mouth daily. 05/29/20   [provider]  Polyethyl Glycol-Propyl Glycol (SYSTANE ULTRA) 0.4-0.3 % SOLN Place 1 drop into both eyes daily as needed (for dry eyes).  [provider]  potassium chloride SA (KLOR-CON) 20 MEQ tablet Take 1 tablet (20 mEq total) by mouth 2 (two) times daily. Patient taking differently: Take 20 mEq by mouth daily.  09/18/19   Sueanne Margarita, MD  sacubitril-valsartan (ENTRESTO) 24-26 MG Take 1 tablet by mouth 2 (two) times daily. 05/07/20   [name], MD  tamsulosin (FLOMAX) 0.4 MG CAPS capsule Take 0.8 mg by mouth at bedtime.  11/04/16   [provider]    Physical Exam: Vitals:   08/30/20 1945 08/30/20 2000 08/30/20 2015 08/30/20 2016  BP: 109/72 (!) 105/38 (!) 89/43   Pulse: (!) 139 71 71   Resp: 16 20 16    Temp:    98.2 F (36.8 C)  TempSrc:      SpO2: 99% 98% 98%     Constitutional: Acute alert and oriented x3, no associated distress.   Skin: no rashes, no lesions, poor skin turgor noted. Eyes: Pupils are equally reactive to light.  No evidence of scleral icterus or conjunctival pallor.  ENMT: Moist mucous membranes noted.  Posterior pharynx clear of any exudate or lesions.   Neck: normal, supple, no masses, no thyromegaly.  No evidence of jugular venous distension.   Respiratory: Scattered rhonchi bilaterally, clear to auscultation bilaterally, no wheezing, no crackles. Normal respiratory effort. No accessory muscle use.  Cardiovascular: Regular rate and rhythm, no murmurs / rubs / gallops.  Significant bilateral lower extremity pitting edema that tracks up to the knees.  2+ pedal pulses. No carotid bruits.  Chest:    Nontender without crepitus or deformity.   Back:   Nontender without crepitus or deformity. Abdomen: Abdomen is protuberant but soft.  Notable lower abdominal tenderness.  No evidence of intra-abdominal masses.  Positive bowel sounds noted in all quadrants.   Musculoskeletal: No joint deformity upper and lower extremities. Good ROM, no contractures. Normal muscle tone.  Neurologic: CN 2-12 grossly intact. Sensation intact.  Patient moving all 4 extremities spontaneously.  Patient is following all commands.  Patient is responsive to verbal stimuli.   Psychiatric: Patient exhibits normal mood with appropriate affect.  Patient seems to possess insight as to their current situation.     Labs on Admission: I have personally reviewed following labs and imaging studies -   CBC: Recent Labs  Lab 08/30/20 1434  WBC 14.4*  HGB 10.9*  HCT 36.7*  MCV 94.3  PLT 99991111   Basic Metabolic Panel: Recent Labs  Lab 08/30/20 1434  NA 138  K 5.8*  CL 113*  CO2 19*  GLUCOSE 108*  BUN 24*  CREATININE 1.43*  CALCIUM 8.8*   GFR: CrCl cannot be calculated (Unknown ideal weight.). Liver Function Tests: Recent Labs  Lab 08/30/20 1434  AST 23  ALT 36  ALKPHOS 123  BILITOT 0.8  PROT 5.8*  ALBUMIN 3.2*   No results for input(s): LIPASE, AMYLASE in the last 168 hours. No results for input(s): AMMONIA in the last 168 hours. Coagulation Profile: Recent Labs  Lab 08/30/20 1425  INR 1.3*   Cardiac Enzymes: No results for input(s): CKTOTAL, CKMB, CKMBINDEX, TROPONINI in the last 168 hours. BNP (last 3 results) Recent Labs    06/03/20 1520  PROBNP 948*   HbA1C: No results for input(s): HGBA1C in the last 72 hours. CBG: No results for input(s): GLUCAP in the last 168 hours. Lipid Profile: No results for input(s): CHOL, HDL, LDLCALC, TRIG, CHOLHDL, LDLDIRECT in the last 72 hours. Thyroid Function Tests: No results for input(s): TSH,  T4TOTAL, FREET4, T3FREE, THYROIDAB in the last 72  hours. Anemia Panel: No results for input(s): VITAMINB12, FOLATE, FERRITIN, TIBC, IRON, RETICCTPCT in the last 72 hours. Urine analysis:    Component Value Date/Time   COLORURINE YELLOW 08/30/2020 1820   APPEARANCEUR CLEAR 08/30/2020 1820   LABSPEC 1.025 08/30/2020 1820   PHURINE 5.5 08/30/2020 1820   GLUCOSEU NEGATIVE 08/30/2020 1820   HGBUR MODERATE (A) 08/30/2020 1820   BILIRUBINUR NEGATIVE 08/30/2020 1820   KETONESUR NEGATIVE 08/30/2020 1820   PROTEINUR 30 (A) 08/30/2020 1820   UROBILINOGEN 0.2 12/05/2014 1052   NITRITE POSITIVE (A) 08/30/2020 1820   LEUKOCYTESUR MODERATE (A) 08/30/2020 1820    Radiological Exams on Admission - Personally Reviewed: DG Chest 2 View  Result Date: 08/30/2020 CLINICAL DATA:  Chest pain and dyspnea EXAM: CHEST - 2 VIEW COMPARISON:  02/21/2020 chest radiograph. FINDINGS: Stable configuration of 2 lead left subclavian pacemaker. Stable cardiomediastinal silhouette with mild cardiomegaly. No pneumothorax. No pleural effusion. Lungs appear clear, with no acute consolidative airspace disease and no pulmonary edema. IMPRESSION: Stable mild cardiomegaly without pulmonary edema. No active pulmonary disease. Electronically Signed   By: Ilona Sorrel M.D.   On: 08/30/2020 15:00    EKG: Personally reviewed.  Rhythm is paced rhythm with heart rate of 67 bpm.  No dynamic ST segment changes appreciated.  Assessment/Plan Principal Problem:   Sepsis due to gram-negative UTI [facility])   Patient is presenting with several day history of hematuria and dysuria as well as lower abdominal pain, weakness and a several week history of low blood pressure  Upon evaluation in the ED, patient has been found to exhibit multiple SIRS criteria including leukocytosis fever in addition to urinalysis suggestive of urinary tract infection in addition to evidence of lactic acidosis all concerning for sepsis.  Patient is already been provided a dose of intravenous ceftriaxone by the  emergency department staff in addition to 3.5 L of lactated Ringer solution.  Patient's blood pressures continue to be low although patient is mentating well and has urinated twice.  Due to tenuous hemodynamic stability we will switch antibiotics to intravenous Zosyn for now.  We will additionally obtain CT imaging of the abdomen and pelvis to ensure that there is no underlying evidence of abscess formation, complicated pyelonephritis or infected stone  Will attempt to transition patient over to a modest regimen of isotonic fluid infusion  Blood cultures obtained, urine cultures obtained  Patient is COVID-negative  If blood pressures continue to decrease will consult CCM  Temporarily holding home regimen of Entresto and diuretics  Active Problems:   Complicated UTI (urinary tract infection)   Please see assessment and plan above    Lactic acidosis   Patient exhibiting lactic acidosis of 3.4  This is secondary to underlying infection and volume depletion  Hydrating patient with intravenous isotonic fluids and treating underlying infection with intravenous antibiotics  Performing serial lactic acid levels to ensure downtrending and resolution    Essential hypertension   Currently holding home regimen of Entresto  Will resume modest regimen of metoprolol starting tomorrow but only with BP parameters -as long as BP improves    Persistent atrial fibrillation ([place])   Continuing home regimen of Eliquis  Currently rate controlled paced rhythm  We will attempt to resume metoprolol tomorrow with BP parameters as long as BP improves.  We will resume digoxin regimen tomorrow assuming hyperkalemia resolves    Chronic kidney disease, stage 3a ([place])  . Strict intake and output monitoring . Creatinine  near baseline . Minimizing nephrotoxic agents as much as possible . Serial chemistries to monitor renal function and electrolytes    Hyperkalemia   Hyperkalemia likely  secondary to volume depletion, slight decrease in renal function all in the setting of digoxin use  Monitoring patient on telemetry  No evidence of associated EKG changes  Mild hyperkalemia is likely to improve with aggressive intravenous volume resuscitation.  We will obtain repeat chemistry to monitor potassium, if hyperkalemia persists will initiate Lokelma    Elevated troponin level not due myocardial infarction   Slight elevation in troponin with flat trajectory of elevation  Possibly mild supply demand mismatch due to ongoing infection  Atypical mild chest discomfort the patient initially presented with has resolved spontaneously  No evidence of dynamic EKG changes  Monitoring patient on telemetry  Continuing to cycle cardiac enzymes   Chronic diastolic CHF (congestive heart failure) ([place])  Patient does exhibit bilateral lower extremity pitting edema although this has been ongoing for quite a while  Monitoring patient closely as I am concerned patient may develop significant volume overload after multiple boluses in the emergency department  Temporarily holding diuretics due to hypotension  Temporarily holding Entresto due to hypotension   Code Status:  Full code Family Communication: Wife is at bedside who has been updated on plan of care  Status is: Observation  The patient remains OBS appropriate and will d/c before 2 midnights.  Dispo: The patient is from: Home              Anticipated d/c is to: Home              Anticipated d/c date is: 2 days              Patient currently is not medically stable to d/c.        Vernelle Emerald MD [facility] Pager [phone]  If 7PM-7AM, please contact night-coverage [email or web address] Use universal [place] password for that web site. If you do not have the password, please call the hospital operator.  08/30/2020, 9:30 PM

## 2020-08-30 NOTE — ED Notes (Signed)
Admitting doctor at the bedside for the past 20 minutes

## 2020-08-30 NOTE — Progress Notes (Signed)
Elink Code Sepsis note:  LA collected at 21:34PM, awaiting results.   Tane Biegler Elink RN 2206 PM

## 2020-08-30 NOTE — Sepsis Progress Note (Signed)
Notified provider of need to order fluid bolus for MAP's < 65.[name]

## 2020-08-30 NOTE — ED Notes (Signed)
Report given to rn on 3e 

## 2020-08-30 NOTE — Progress Notes (Signed)
Elink Code Sepsis Completion note:  LA resulted at 2215 PM @ 1.4. down from 3.4. Pt is being admitted to Progressive unit for further monitoring.   Raelin Pixler Elink RN 2239 PM

## 2020-08-30 NOTE — Progress Notes (Signed)
Elink Code Sepsis note:  Spoke with ED RN about patient not having a repeat LA after the one taken around 1700. RN reported that the [place] will be collected.   Neriah Brott Elink RN 2108 PM

## 2020-08-30 NOTE — Sepsis Progress Note (Signed)
Sepsis protocol is being followed by eLink. 

## 2020-08-30 NOTE — ED Triage Notes (Signed)
Pt to triage via [facility] from [facility].  Reports L sided chest pain and neck pain that started this morning. Also reports SOB and feeling like he was going to pass out.  Denies pain at present.  BP 92/48 for EMS.

## 2020-08-30 NOTE — ED Notes (Signed)
Iv fluid down to 75 ml.hr per admitting dxctor

## 2020-08-30 NOTE — ED Notes (Signed)
The pt has multiple complaints for several days  Today he is having weakness chest pain intermittently especially over his pacemaker. Nausea  More tired than usual  With some dizziness  He has had the covid vaccines and the booster

## 2020-08-31 ENCOUNTER — Inpatient Hospital Stay (HOSPITAL_COMMUNITY): Payer: Medicare Other

## 2020-08-31 ENCOUNTER — Encounter (HOSPITAL_COMMUNITY): Payer: Self-pay | Admitting: Internal Medicine

## 2020-08-31 DIAGNOSIS — I442 Atrioventricular block, complete: Secondary | ICD-10-CM | POA: Diagnosis present

## 2020-08-31 DIAGNOSIS — Z888 Allergy status to other drugs, medicaments and biological substances status: Secondary | ICD-10-CM | POA: Diagnosis not present

## 2020-08-31 DIAGNOSIS — N390 Urinary tract infection, site not specified: Secondary | ICD-10-CM

## 2020-08-31 DIAGNOSIS — D6832 Hemorrhagic disorder due to extrinsic circulating anticoagulants: Secondary | ICD-10-CM | POA: Diagnosis present

## 2020-08-31 DIAGNOSIS — R778 Other specified abnormalities of plasma proteins: Secondary | ICD-10-CM | POA: Diagnosis present

## 2020-08-31 DIAGNOSIS — M159 Polyosteoarthritis, unspecified: Secondary | ICD-10-CM | POA: Diagnosis present

## 2020-08-31 DIAGNOSIS — N179 Acute kidney failure, unspecified: Secondary | ICD-10-CM | POA: Diagnosis present

## 2020-08-31 DIAGNOSIS — D631 Anemia in chronic kidney disease: Secondary | ICD-10-CM | POA: Diagnosis present

## 2020-08-31 DIAGNOSIS — F32A Depression, unspecified: Secondary | ICD-10-CM | POA: Diagnosis present

## 2020-08-31 DIAGNOSIS — Z9884 Bariatric surgery status: Secondary | ICD-10-CM | POA: Diagnosis not present

## 2020-08-31 DIAGNOSIS — E875 Hyperkalemia: Secondary | ICD-10-CM

## 2020-08-31 DIAGNOSIS — K819 Cholecystitis, unspecified: Secondary | ICD-10-CM | POA: Diagnosis not present

## 2020-08-31 DIAGNOSIS — I959 Hypotension, unspecified: Secondary | ICD-10-CM | POA: Diagnosis present

## 2020-08-31 DIAGNOSIS — I5032 Chronic diastolic (congestive) heart failure: Secondary | ICD-10-CM | POA: Diagnosis not present

## 2020-08-31 DIAGNOSIS — A419 Sepsis, unspecified organism: Secondary | ICD-10-CM | POA: Diagnosis present

## 2020-08-31 DIAGNOSIS — Z20822 Contact with and (suspected) exposure to covid-19: Secondary | ICD-10-CM | POA: Diagnosis present

## 2020-08-31 DIAGNOSIS — K649 Unspecified hemorrhoids: Secondary | ICD-10-CM | POA: Diagnosis present

## 2020-08-31 DIAGNOSIS — A4150 Gram-negative sepsis, unspecified: Secondary | ICD-10-CM | POA: Diagnosis present

## 2020-08-31 DIAGNOSIS — Z8711 Personal history of peptic ulcer disease: Secondary | ICD-10-CM | POA: Diagnosis not present

## 2020-08-31 DIAGNOSIS — I130 Hypertensive heart and chronic kidney disease with heart failure and stage 1 through stage 4 chronic kidney disease, or unspecified chronic kidney disease: Secondary | ICD-10-CM | POA: Diagnosis present

## 2020-08-31 DIAGNOSIS — Z950 Presence of cardiac pacemaker: Secondary | ICD-10-CM | POA: Diagnosis not present

## 2020-08-31 DIAGNOSIS — R319 Hematuria, unspecified: Secondary | ICD-10-CM | POA: Diagnosis present

## 2020-08-31 DIAGNOSIS — N1831 Chronic kidney disease, stage 3a: Secondary | ICD-10-CM | POA: Diagnosis present

## 2020-08-31 DIAGNOSIS — I4821 Permanent atrial fibrillation: Secondary | ICD-10-CM | POA: Diagnosis present

## 2020-08-31 DIAGNOSIS — E872 Acidosis: Secondary | ICD-10-CM | POA: Diagnosis present

## 2020-08-31 DIAGNOSIS — I5033 Acute on chronic diastolic (congestive) heart failure: Secondary | ICD-10-CM | POA: Diagnosis present

## 2020-08-31 LAB — COMPREHENSIVE METABOLIC PANEL
ALT: 36 U/L (ref 0–44)
AST: 23 U/L (ref 15–41)
Albumin: 2.6 g/dL — ABNORMAL LOW (ref 3.5–5.0)
Alkaline Phosphatase: 97 U/L (ref 38–126)
Anion gap: 7 (ref 5–15)
BUN: 30 mg/dL — ABNORMAL HIGH (ref 8–23)
CO2: 19 mmol/L — ABNORMAL LOW (ref 22–32)
Calcium: 8.2 mg/dL — ABNORMAL LOW (ref 8.9–10.3)
Chloride: 114 mmol/L — ABNORMAL HIGH (ref 98–111)
Creatinine, Ser: 1.92 mg/dL — ABNORMAL HIGH (ref 0.61–1.24)
GFR, Estimated: 35 mL/min — ABNORMAL LOW (ref >60)
Glucose, Bld: 113 mg/dL — ABNORMAL HIGH (ref 70–99)
Potassium: 5.2 mmol/L — ABNORMAL HIGH (ref 3.5–5.1)
Sodium: 140 mmol/L (ref 135–145)
Total Bilirubin: 0.8 mg/dL (ref 0.3–1.2)
Total Protein: 4.8 g/dL — ABNORMAL LOW (ref 6.5–8.1)

## 2020-08-31 LAB — CBC WITH DIFFERENTIAL/PLATELET
Abs Immature Granulocytes: 0.02 10*3/uL (ref 0.00–0.07)
Basophils Absolute: 0 10*3/uL (ref 0.0–0.1)
Basophils Relative: 0 %
Eosinophils Absolute: 0 10*3/uL (ref 0.0–0.5)
Eosinophils Relative: 1 %
HCT: 29.6 % — ABNORMAL LOW (ref 39.0–52.0)
Hemoglobin: 8.8 g/dL — ABNORMAL LOW (ref 13.0–17.0)
Immature Granulocytes: 0 %
Lymphocytes Relative: 14 %
Lymphs Abs: 0.9 10*3/uL (ref 0.7–4.0)
MCH: 28.2 pg (ref 26.0–34.0)
MCHC: 29.7 g/dL — ABNORMAL LOW (ref 30.0–36.0)
MCV: 94.9 fL (ref 80.0–100.0)
Monocytes Absolute: 0.5 10*3/uL (ref 0.1–1.0)
Monocytes Relative: 8 %
Neutro Abs: 4.7 10*3/uL (ref 1.7–7.7)
Neutrophils Relative %: 77 %
Platelets: 127 10*3/uL — ABNORMAL LOW (ref 150–400)
RBC: 3.12 MIL/uL — ABNORMAL LOW (ref 4.22–5.81)
RDW: 15.9 % — ABNORMAL HIGH (ref 11.5–15.5)
WBC: 6.1 10*3/uL (ref 4.0–10.5)
nRBC: 0 % (ref 0.0–0.2)

## 2020-08-31 LAB — APTT: aPTT: 39 seconds — ABNORMAL HIGH (ref 24–36)

## 2020-08-31 LAB — MAGNESIUM: Magnesium: 1.7 mg/dL (ref 1.7–2.4)

## 2020-08-31 LAB — PROTIME-INR
INR: 1.7 — ABNORMAL HIGH (ref 0.8–1.2)
Prothrombin Time: 19 seconds — ABNORMAL HIGH (ref 11.4–15.2)

## 2020-08-31 LAB — CORTISOL-AM, BLOOD: Cortisol - AM: 14.6 ug/dL (ref 6.7–22.6)

## 2020-08-31 LAB — TROPONIN I (HIGH SENSITIVITY): Troponin I (High Sensitivity): 28 ng/L — ABNORMAL HIGH (ref <18)

## 2020-08-31 LAB — PROCALCITONIN: Procalcitonin: 1.14 ng/mL

## 2020-08-31 MED ORDER — LACTATED RINGERS IV SOLN: INTRAVENOUS | Status: AC

## 2020-08-31 NOTE — Progress Notes (Addendum)
PROGRESS NOTE    Eric Mayer  [id number] DOB: 1940/10/07 DOA: 08/30/2020 PCP: [name], MD   Chief Complaint  Patient presents with  . Chest Pain    Brief Narrative: Patient was admitted 08/31/2019 with concerns for urosepsis but presented because of transient chest pain. 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 Abnormal lactic acid, hypotension, mild which actually had been going on for some time, abnormal urinalysis.  Treated with ceftriaxone changed to Zosyn, haziness around the gallbladder on CT ultrasound checked.  Also acute on chronic kidney injury.  Mild.  Troponins elevated not thought to be from MI.   Assessment & Plan:   Sepsis suspected due to gram-negative UTI Due to hematuria dysuria abnormal UA, ultrasound of gallbladder pending this could be a source as well.  Continue IV fluids at current rate.  Follow-up blood and urine cultures.  Allow diet once ultrasound returns.  Continue Zosyn.  Blood pressures are improved.   [place] neg for cholecystitis + GS and fatty liver If infectious process suspect urosepsis though question if he had overdiuresis at home leading to hypotension   Lactic acidosis Resolved with level of 1.4 late last night  CHF Symptomatic DOE question how much is from CHF versus hypotension/sepsis.  May need medication adjustment pending resolution of infection.  Blood pressures were low as outpatient.  Holding [place].  He has not exhibited signs  of significant volume overload i.e. pulmonary edema so far diuretics on hold  Hypertension Holding Entresto given hypotension  Hyperkalemia  Mild, continue to monitor IV hydration  Elevated troponin not due to myocardial infarction  Acute on chronic kidney injury with chronic kidney disease stage IIIa  Anticipate this responding to hydration, continue to hold diuretics and Entresto.  Persistent atrial fibrillation  Continue home regimen of Eliquis   Principal Problem:   Sepsis due to gram-negative UTI (HCC) Active Problems:   Essential hypertension   Chronic diastolic CHF (congestive heart failure) (HCC)   Persistent atrial fibrillation (HCC)   History of prostate cancer   Complicated UTI (urinary tract infection)   Chronic kidney disease, stage 3a (HCC)   Hyperkalemia   Elevated troponin level not due myocardial infarction   Lactic acidosis   Sepsis due to gram-negative urinary tract infection (HCC)      DVT prophylaxis: (Lovenox/Heparin/SCD's/anticoagulated/None (if comfort care)  On Eliquis for A. fib  Code Status: Full Family Communication: (Specify name, relationship & date discussed. NO "discussed with patient") Disposition:   Status is: Inpatient    Dispo: The patient is from: [facility] d/c is to: [facility] [employer]              Anticipated d/c date is: 09/03/2019              Patient currently is not medically stable to d/c.       Consultants:   None  Procedures: (Don't include imaging studies which can be auto populated. Include things that cannot be auto populated i.e. Echo, Carotid and venous dopplers, Foley, Bipap, HD, tubes/drains, wound  vac, central lines etc)  None so far  Antimicrobials: (specify start and planned stop date. Auto populated tables are space occupying and do not give end dates) 1 dose ceftriaxone then changed to Zosyn 3.375 g every 8   Subjective: Patient reports feeling somewhat stronger  but still has significant dyspnea on exertion and weakness upon ambulation.  States that his lower extremities are slightly more swollen than normal.  No chest discomfort.  Awaiting ultrasound.  Objective: Vitals:   08/31/20 0447 08/31/20 0800 08/31/20 1014 08/31/20 1200  BP:  (!) 98/38  (!) 97/50  Pulse:  73 62   Resp:  (!) 29  (!) 29  Temp:      TempSrc:      SpO2:  100%    Weight: 131.6 kg     Height:        Intake/Output Summary (Last 24 hours) at 08/31/2020 1318 Last data filed at 08/31/2020 1300 Gross per 24 hour  Intake 2960 ml  Output 250 ml  Net 2710 ml   Filed Weights   08/30/20 2147 08/31/20 0447  Weight: 131.1 kg 131.6 kg    Examination:  General exam: Appears calm and comfortable resting in the chair not tachypneic Respiratory system: Clear to auscultation. Respiratory effort normal.  Slight crackles at the base that clear with respiration Cardiovascular system: S1 & S2 heard, RRR. No JVD, murmurs, rubs, gallops or clicks.  2+ lower extremity edema pretibial left greater than right which is 1+. Gastrointestinal system: Abdomen is obese, soft and nontender. No organomegaly or masses felt. Normal bowel sounds heard. Central nervous system: Alert and oriented. No focal neurological deficits. Psychiatry: Judgement and insight appear normal. Mood & affect appropriate.     Data Reviewed: I have personally reviewed following labs and imaging studies  CBC: Recent Labs  Lab 08/30/20 1434 08/31/20 0255  WBC 14.4* 6.1  NEUTROABS  --  4.7  HGB 10.9* 8.8*  HCT 36.7* 29.6*  MCV 94.3 94.9  PLT 189 127*    Basic Metabolic Panel: Recent Labs  Lab 08/30/20 1434 08/30/20 2231 08/31/20 0255  NA 138 135 140  K 5.8* 5.3* 5.2*  CL 113* 112* 114*  CO2 19* 16* 19*  GLUCOSE 108* 98 113*  BUN 24* 28* 30*  CREATININE 1.43* 1.60* 1.92*  CALCIUM 8.8* 8.0* 8.2*  MG  --   --  1.7  PHOS  --  2.7  --     GFR: Estimated Creatinine Clearance: 43.9 mL/min (A) (by C-G  formula based on SCr of 1.92 mg/dL (H)).  Liver Function Tests: Recent Labs  Lab 08/30/20 1434 08/30/20 2231 08/31/20 0255  AST 23  --  23  ALT 36  --  36  ALKPHOS 123  --  97  BILITOT 0.8  --  0.8  PROT 5.8*  --  4.8*  ALBUMIN 3.2* 2.7* 2.6*      Recent Results (from the past 240 hour(s))  Culture, blood (Routine x 2)     Status: None (Preliminary result)   Collection Time: 08/30/20  2:35 PM   Specimen: BLOOD RIGHT ARM  Result Value Ref Range Status   Specimen Description BLOOD RIGHT ARM  Final   Special Requests   Final    BOTTLES DRAWN AEROBIC AND ANAEROBIC Blood Culture results may not be optimal due to an excessive volume of blood received in culture bottles   Culture   Final    NO GROWTH < 24 HOURS Performed at [facility],  [address] [place], [place] [address]    Report Status PENDING  Incomplete  Culture, blood (Routine x 2)     Status: None (Preliminary result)   Collection Time: 08/30/20  5:08 PM   Specimen: BLOOD RIGHT HAND  Result Value Ref Range Status   Specimen Description BLOOD RIGHT HAND  Final   Special Requests   Final    BOTTLES DRAWN AEROBIC AND ANAEROBIC Blood Culture results may not be optimal due to an inadequate volume of blood received in culture bottles   Culture   Final    NO GROWTH < 24 HOURS Performed at [facility], [place] [address] [place], [place] [address]    Report Status PENDING  Incomplete  Resp Panel by RT-PCR (Flu A&B, Covid) Nasopharyngeal Swab     Status: None   Collection Time: 08/30/20  5:22 PM   Specimen: Nasopharyngeal Swab; Nasopharyngeal(NP) swabs in vial transport medium  Result Value Ref Range Status   SARS Coronavirus 2 by RT PCR NEGATIVE NEGATIVE Final    Comment: (NOTE) SARS-CoV-2 target nucleic acids are NOT DETECTED.  The SARS-CoV-2 RNA is generally detectable in upper respiratory specimens during the acute phase of infection. The lowest concentration of SARS-CoV-2 viral copies this assay can  detect is 138 copies/mL. A negative result does not preclude SARS-Cov-2 infection and should not be used as the sole basis for treatment or other patient management decisions. A negative result may occur with  improper specimen collection/handling, submission of specimen other than nasopharyngeal swab, presence of viral mutation(s) within the areas targeted by this assay, and inadequate number of viral copies(<138 copies/mL). A negative result must be combined with clinical observations, patient history, and epidemiological information. The expected result is Negative.  Fact Sheet for Patients:  EntrepreneurPulse.com.au  Fact Sheet for Healthcare Providers:  IncredibleEmployment.be  This test is no t yet approved or cleared by the [place] FDA and  has been authorized for detection and/or diagnosis of SARS-CoV-2 by FDA under an Emergency Use Authorization (EUA). This EUA will remain  in effect (meaning this test can be used) for the duration of the COVID-19 declaration under Section 564(b)(1) of the Act, 21 U.S.C.section 360bbb-3(b)(1), unless the authorization is terminated  or revoked sooner.       Influenza A by PCR NEGATIVE NEGATIVE Final   Influenza B by PCR NEGATIVE NEGATIVE Final    Comment: (NOTE) The Xpert Xpress SARS-CoV-2/FLU/RSV plus assay is intended as an aid in the diagnosis of influenza from Nasopharyngeal swab specimens and should not be used as a sole basis for treatment. Nasal washings and aspirates are unacceptable for Xpert Xpress SARS-CoV-2/FLU/RSV testing.  Fact Sheet for Patients: EntrepreneurPulse.com.au  Fact Sheet for Healthcare Providers: IncredibleEmployment.be  This test is not yet approved or cleared by the [place] FDA and has been authorized for detection and/or diagnosis of SARS-CoV-2 by FDA under an Emergency Use Authorization (EUA). This EUA will remain in  effect (meaning this test can be used) for the duration of the COVID-19 declaration under Section 564(b)(1) of the Act, 21 U.S.C. section 360bbb-3(b)(1), unless the authorization is terminated or revoked.  Performed at [facility], [place] [address] [place], [place] [address]          Radiology Studies: CT ABDOMEN PELVIS WO CONTRAST  Result Date: 08/31/2020 CLINICAL DATA:  Sepsis due to a grand negative UTI. EXAM: CT ABDOMEN AND PELVIS WITHOUT CONTRAST TECHNIQUE: Multidetector CT imaging of the abdomen and pelvis was performed  following the standard protocol without IV contrast. COMPARISON:  October 21, 2017 FINDINGS: Lower chest: The lung bases are clear. The heart size is normal. Hepatobiliary: The liver is normal. There is cholelithiasis. There is mild haziness about the gallbladder.There is no biliary ductal dilation. Pancreas: Normal contours without ductal dilatation. No peripancreatic fluid collection. Spleen: Unremarkable. Adrenals/Urinary Tract: --Adrenal glands: Unremarkable. --Right kidney/ureter: There is a punctate nonobstructing stone. --Left kidney/ureter: No hydronephrosis or radiopaque kidney stones. --Urinary bladder: Unremarkable. Stomach/Bowel: --Stomach/Duodenum: No hiatal hernia or other gastric abnormality. Normal duodenal course and caliber. --Small bowel: Unremarkable. --Colon: There is an above average amount of stool in the colon. There is liquid stool in the transverse colon. --Appendix: Normal. Vascular/Lymphatic: Atherosclerotic calcification is present within the non-aneurysmal abdominal aorta, without hemodynamically significant stenosis. --No retroperitoneal lymphadenopathy. --No mesenteric lymphadenopathy. --No pelvic or inguinal lymphadenopathy. Reproductive: Brachytherapy beads are noted in the prostate gland. Other: There are fat containing bilateral inguinal hernias. There are stable postsurgical changes in the left inguinal region. Musculoskeletal. No acute  displaced fractures. IMPRESSION: 1. Cholelithiasis with mild haziness about the gallbladder. If there is concern for acute cholecystitis, recommend further evaluation with right upper quadrant ultrasound. 2. Nonobstructive right nephrolithiasis. 3. Large stool burden. Liquid stool in the transverse colon consistent with a possible underlying diarrheal illness. 4. Additional chronic findings as detailed above. Aortic Atherosclerosis (ICD10-I70.0). Electronically Signed   By: Constance Holster M.D.   On: 08/31/2020 00:06   DG Chest 2 View  Result Date: 08/30/2020 CLINICAL DATA:  Chest pain and dyspnea EXAM: CHEST - 2 VIEW COMPARISON:  02/21/2020 chest radiograph. FINDINGS: Stable configuration of 2 lead left subclavian pacemaker. Stable cardiomediastinal silhouette with mild cardiomegaly. No pneumothorax. No pleural effusion. Lungs appear clear, with no acute consolidative airspace disease and no pulmonary edema. IMPRESSION: Stable mild cardiomegaly without pulmonary edema. No active pulmonary disease. Electronically Signed   By: Ilona Sorrel M.D.   On: 08/30/2020 15:00   US Abdomen Limited RUQ (LIVER/GB)  Result Date: 08/31/2020 CLINICAL DATA:  Cholecystitis EXAM: ULTRASOUND ABDOMEN LIMITED RIGHT UPPER QUADRANT COMPARISON:  08/30/2020 CT abdomen/pelvis. FINDINGS: Gallbladder: A few layering shadowing gallstones in the gallbladder, largest 1.2 cm. No definite gallbladder wall thickening (gallbladder wall thickness 2.6 mm). No sonographic Murphy's sign. No pericholecystic fluid. Common bile duct: Diameter: 3 mm Liver: Diffusely echogenic liver parenchyma. No definite liver surface irregularity. No liver mass, noting decreased sensitivity in the setting of an echogenic liver. Portal vein is patent on color Doppler imaging with normal direction of blood flow towards the liver. Other: None. IMPRESSION: 1. Cholelithiasis.  No sonographic evidence of acute cholecystitis. 2. No biliary ductal dilatation. 3.  Echogenic liver, a nonspecific finding that can be due to hepatic steatosis and/or fibrosis. Recommend correlation with liver function tests. Consider outpatient hepatic elastography for further liver fibrosis risk stratification, as clinically warranted. Electronically Signed   By: Ilona Sorrel M.D.   On: 08/31/2020 09:25        Scheduled Meds: . apixaban  5 mg Oral BID  . buPROPion  300 mg Oral Daily  . digoxin  0.125 mg Oral Daily  . escitalopram  10 mg Oral Daily  . ibuprofen  800 mg Oral Once  . metoprolol tartrate  75 mg Oral BID  . mirabegron ER  50 mg Oral Daily  . tamsulosin  0.8 mg Oral QHS   Continuous Infusions: . lactated ringers 75 mL/hr at 08/31/20 1035  . piperacillin-tazobactam (ZOSYN)  IV 3.375 g (08/31/20 1216)  LOS: 0 days    Time spent:     Silvano Rusk, MD [facility]   To contact the attending provider between 7A-7P or the covering provider during after hours 7P-7A, please log into the web site [email or web address] and access using universal [place] password for that web site. If you do not have the password, please call the hospital operator.  08/31/2020, 1:18 PM

## 2020-08-31 NOTE — Progress Notes (Signed)
   08/31/20 0446  Assess: MEWS Score  BP (!) 87/39  Pulse Rate 69  ECG Heart Rate 69  Resp (!) 25  Level of Consciousness Alert  SpO2 98 %  O2 Device Room Air  Assess: MEWS Score  MEWS Temp 0  MEWS Systolic 1  MEWS Pulse 0  MEWS RR 1  MEWS LOC 0  MEWS Score 2  MEWS Score Color Yellow  Assess: if the MEWS score is Yellow or Red  Were vital signs taken at a resting state? Yes  Focused Assessment No change from prior assessment  Early Detection of Sepsis Score *See Row Information* Medium  MEWS guidelines implemented *See Row Information* Yes  Treat  MEWS Interventions Administered scheduled meds/treatments  Pain Scale 0-10  Pain Score 0  Take Vital Signs  Increase Vital Sign Frequency  Yellow: Q 2hr X 2 then Q 4hr X 2, if remains yellow, continue Q 4hrs  Escalate  MEWS: Escalate Yellow: discuss with charge nurse/RN and consider discussing with provider and RRT  Notify: Charge Nurse/RN  Name of Charge Nurse/RN Notified Jacquetta RN  Date Charge Nurse/RN Notified 08/31/20  Time Charge Nurse/RN Notified [phone]

## 2020-09-01 DIAGNOSIS — I5032 Chronic diastolic (congestive) heart failure: Secondary | ICD-10-CM | POA: Diagnosis not present

## 2020-09-01 DIAGNOSIS — A4150 Gram-negative sepsis, unspecified: Secondary | ICD-10-CM | POA: Diagnosis not present

## 2020-09-01 DIAGNOSIS — N1831 Chronic kidney disease, stage 3a: Secondary | ICD-10-CM | POA: Diagnosis not present

## 2020-09-01 DIAGNOSIS — K819 Cholecystitis, unspecified: Secondary | ICD-10-CM

## 2020-09-01 LAB — BASIC METABOLIC PANEL
Anion gap: 9 (ref 5–15)
BUN: 25 mg/dL — ABNORMAL HIGH (ref 8–23)
CO2: 18 mmol/L — ABNORMAL LOW (ref 22–32)
Calcium: 8.7 mg/dL — ABNORMAL LOW (ref 8.9–10.3)
Chloride: 111 mmol/L (ref 98–111)
Creatinine, Ser: 1.68 mg/dL — ABNORMAL HIGH (ref 0.61–1.24)
GFR, Estimated: 42 mL/min — ABNORMAL LOW (ref >60)
Glucose, Bld: 108 mg/dL — ABNORMAL HIGH (ref 70–99)
Potassium: 4.8 mmol/L (ref 3.5–5.1)
Sodium: 138 mmol/L (ref 135–145)

## 2020-09-01 LAB — PROCALCITONIN: Procalcitonin: 0.74 ng/mL

## 2020-09-01 MED ORDER — METOPROLOL TARTRATE 25 MG PO TABS
25.0000 mg | ORAL_TABLET | Freq: Two times a day (BID) | ORAL | Status: DC
  Administered 2020-09-01 – 2020-09-02 (×2): 25 mg via ORAL
  Filled 2020-09-01 (×4): qty 1

## 2020-09-01 MED ORDER — METOPROLOL TARTRATE 50 MG PO TABS
50.0000 mg | ORAL_TABLET | Freq: Two times a day (BID) | ORAL | Status: DC
  Administered 2020-09-01: 50 mg via ORAL
  Filled 2020-09-01: qty 1

## 2020-09-01 MED ORDER — FUROSEMIDE 10 MG/ML IJ SOLN
20.0000 mg | Freq: Every day | INTRAMUSCULAR | Status: DC
  Administered 2020-09-01 – 2020-09-02 (×2): 20 mg via INTRAVENOUS
  Filled 2020-09-01 (×2): qty 2

## 2020-09-01 NOTE — Plan of Care (Signed)
  Problem: Clinical Measurements: Goal: Respiratory complications will improve Outcome: Progressing   Problem: Activity: Goal: Risk for activity intolerance will decrease Outcome: Progressing   Problem: Coping: Goal: Level of anxiety will decrease Outcome: Progressing   

## 2020-09-01 NOTE — Progress Notes (Signed)
[facility]                                                                              Patient Demographics  Eric Mayer, is a 80 y.o. male, DOB - 1941/04/24, OX:[id number]  Admit date - 08/30/2020   Admitting Physician No admitting provider for patient encounter.  Outpatient Primary MD for the patient is [name], MD  Outpatient specialists:   LOS - 1  days   Medical records reviewed and are as summarized below:    Chief Complaint  Patient presents with  . Chest Pain       Brief summary   Patient was admitted 08/31/2019 with concerns for urosepsis but presented because of transient chest pain. 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 Abnormal lactic acid, hypotension, mild which actually had been going on for some time, abnormal urinalysis.  Treated with ceftriaxone changed to Zosyn, haziness around the gallbladder on CT ultrasound checked.  Also acute on chronic kidney injury.  Mild.  Troponins elevated not thought to be from MI.    Assessment & Plan    Principal Problem: Sepsis secondary to UTI, POA -Presented with hematuria and dysuria, lactic acidosis, hypotension, AKI -Blood cultures negative so far, urine culture apparently not sent from ED, UA was positive -Requested add on urine culture, unfortunately now patient has been on antibiotics is likely going to be negative -Currently on IV Zosyn, no prior urine cultures to compare with -CT  abdomen with concern of possible cholecystitis however abdominal ultrasound negative for acute cholecystitis. -Procalcitonin improving 0.74 <-1.1  Active Problems: Hematuria in the setting of anticoagulation and UTI -Currently hematuria improving, per patient 'pinkish' -States he had contacted [facility] prior to admission, will notify them -H&H stable on 1/16, recheck CBC today  Lactic acidosis Resolved  Acute on chronic diastolic CHF -2D echo 0000000 had shown EF of 60 to 65% -Follows Dr. Haroldine Laws outpatient, diuretics, Delene Loll has been held secondary to hypotension -Now 2+ pitting edema - will decrease beta-blocker, once BP stable, add low-dose Lasix IV -Strict I's and O's and daily weights   Hypertension BP currently soft, decrease beta-blocker   Mild acute on CKD stage IIIa -Baseline creatinine 1.2-1.3, presented with creatinine of 1.9 -Improving, 1.6    Persistent atrial fibrillation -Heart rate currently controlled on beta-blocker, digoxin -Continue eliquis  Obesity Estimated body mass index is 41.55 kg/m as calculated from the following:   Height as of this encounter: 5\' 10"  (1.778 m).   Weight as of this encounter: 131.4 kg.  Code Status: Full CODE STATUS DVT Prophylaxis: Eliquis Family Communication: Discussed all imaging results, lab results, explained to the patient   Disposition Plan:     Status is: Inpatient  Remains inpatient appropriate because:Inpatient level of care appropriate due to severity of illness   Dispo: The patient is  from: Home              Anticipated d/c is to: Home              Anticipated d/c date is: 2 days              Patient currently is not medically stable to d/c.  Awaiting culture results, acute on chronic CHF      Time Spent in minutes   35 minutes  Procedures:  None  Consultants:   None  Antimicrobials:   Anti-infectives (From admission, onward)   Start     Dose/Rate Route Frequency Ordered  Stop   08/31/20 0415  piperacillin-tazobactam (ZOSYN) IVPB 3.375 g       "Followed by" Linked Group Details   3.375 g 12.5 mL/hr over 240 Minutes Intravenous Every 8 hours 08/30/20 2145     08/30/20 2145  piperacillin-tazobactam (ZOSYN) IVPB 3.375 g       "Followed by" Linked Group Details   3.375 g 100 mL/hr over 30 Minutes Intravenous  Once 08/30/20 2145 08/30/20 2336   08/30/20 1600  cefTRIAXone (ROCEPHIN) 1 g in sodium chloride 0.9 % 100 mL IVPB  Status:  Discontinued        1 g 200 mL/hr over 30 Minutes Intravenous Every 24 hours 08/30/20 1556 08/30/20 2145          Medications  Scheduled Meds: . apixaban  5 mg Oral BID  . buPROPion  300 mg Oral Daily  . digoxin  0.125 mg Oral Daily  . escitalopram  10 mg Oral Daily  . ibuprofen  800 mg Oral Once  . metoprolol tartrate  50 mg Oral BID  . mirabegron ER  50 mg Oral Daily  . tamsulosin  0.8 mg Oral QHS   Continuous Infusions: . piperacillin-tazobactam (ZOSYN)  IV 3.375 g (09/01/20 0934)   PRN Meds:.acetaminophen **OR** acetaminophen, ipratropium-albuterol, ondansetron **OR** ondansetron (ZOFRAN) IV, polyethylene glycol      Subjective:   Eric Mayer was seen and examined today.  States hematuria is improving, pinkish in color.  BP still borderline, no acute chest pain.  Significant peripheral edema. Patient denies dizziness, chest pain, shortness of breath, abdominal pain, N/V/D/C. No acute events overnight.    Objective:   Vitals:   09/01/20 0700 09/01/20 0748 09/01/20 0900 09/01/20 1112  BP:  (!) 100/55 (!) 106/44 (!) 115/43  Pulse:      Resp: 19 20  20   Temp:      TempSrc:      SpO2:      Weight:      Height:        Intake/Output Summary (Last 24 hours) at 09/01/2020 1248 Last data filed at 09/01/2020 0200 Gross per 24 hour  Intake 1300.57 ml  Output 250 ml  Net 1050.57 ml     Wt Readings from Last 3 Encounters:  09/01/20 131.4 kg  07/14/20 127.3 kg  06/09/20 126.6 kg      Exam  General: Alert and oriented x 3, NAD  Cardiovascular: Irregularly irregular  Respiratory: Decreased breath sound at the bases  Gastrointestinal: Soft, nontender, nondistended, + bowel sounds  Ext: 2+  pedal edema bilaterally  Neuro: no new deficits  Musculoskeletal: No digital cyanosis, clubbing  Skin: No rashes  Psych: Normal affect and demeanor, alert and oriented x3    Data Reviewed:  I have personally reviewed following labs and imaging studies  Micro Results Recent Results (from the past 240 hour(s))  Culture,  blood (Routine x 2)     Status: None (Preliminary result)   Collection Time: 08/30/20  2:35 PM   Specimen: BLOOD RIGHT ARM  Result Value Ref Range Status   Specimen Description BLOOD RIGHT ARM  Final   Special Requests   Final    BOTTLES DRAWN AEROBIC AND ANAEROBIC Blood Culture results may not be optimal due to an excessive volume of blood received in culture bottles   Culture   Final    NO GROWTH 2 DAYS Performed at [facility], [place] [address] [place], [place] [address]    Report Status PENDING  Incomplete  Culture, blood (Routine x 2)     Status: None (Preliminary result)   Collection Time: 08/30/20  5:08 PM   Specimen: BLOOD RIGHT HAND  Result Value Ref Range Status   Specimen Description BLOOD RIGHT HAND  Final   Special Requests   Final    BOTTLES DRAWN AEROBIC AND ANAEROBIC Blood Culture results may not be optimal due to an inadequate volume of blood received in culture bottles   Culture   Final    NO GROWTH 2 DAYS Performed at [facility], [place] [address], [place], [place] [address]    Report Status PENDING  Incomplete  Resp Panel by RT-PCR (Flu A&B, Covid) Nasopharyngeal Swab     Status: None   Collection Time: 08/30/20  5:22 PM   Specimen: Nasopharyngeal Swab; Nasopharyngeal(NP) swabs in vial transport medium  Result Value Ref Range Status   SARS Coronavirus 2 by RT PCR NEGATIVE NEGATIVE Final    Comment:  (NOTE) SARS-CoV-2 target nucleic acids are NOT DETECTED.  The SARS-CoV-2 RNA is generally detectable in upper respiratory specimens during the acute phase of infection. The lowest concentration of SARS-CoV-2 viral copies this assay can detect is 138 copies/mL. A negative result does not preclude SARS-Cov-2 infection and should not be used as the sole basis for treatment or other patient management decisions. A negative result may occur with  improper specimen collection/handling, submission of specimen other than nasopharyngeal swab, presence of viral mutation(s) within the areas targeted by this assay, and inadequate number of viral copies(<138 copies/mL). A negative result must be combined with clinical observations, patient history, and epidemiological information. The expected result is Negative.  Fact Sheet for Patients:  EntrepreneurPulse.com.au  Fact Sheet for Healthcare Providers:  IncredibleEmployment.be  This test is no t yet approved or cleared by the [place] FDA and  has been authorized for detection and/or diagnosis of SARS-CoV-2 by FDA under an Emergency Use Authorization (EUA). This EUA will remain  in effect (meaning this test can be used) for the duration of the COVID-19 declaration under Section 564(b)(1) of the Act, 21 U.S.C.section 360bbb-3(b)(1), unless the authorization is terminated  or revoked sooner.       Influenza A by PCR NEGATIVE NEGATIVE Final   Influenza B by PCR NEGATIVE NEGATIVE Final    Comment: (NOTE) The Xpert Xpress SARS-CoV-2/FLU/RSV plus assay is intended as an aid in the diagnosis of influenza from Nasopharyngeal swab specimens and should not be used as a sole basis for treatment. Nasal washings and aspirates are unacceptable for Xpert Xpress SARS-CoV-2/FLU/RSV testing.  Fact Sheet for Patients: EntrepreneurPulse.com.au  Fact Sheet for Healthcare  Providers: IncredibleEmployment.be  This test is not yet approved or cleared by the [place] FDA and has been authorized for detection and/or diagnosis of SARS-CoV-2 by FDA under an Emergency Use Authorization (EUA). This EUA will remain in effect (  meaning this test can be used) for the duration of the COVID-19 declaration under Section 564(b)(1) of the Act, 21 U.S.C. section 360bbb-3(b)(1), unless the authorization is terminated or revoked.  Performed at [facility], [place] [address], [place], [place] [address]     Radiology Reports CT ABDOMEN PELVIS WO CONTRAST  Result Date: 08/31/2020 CLINICAL DATA:  Sepsis due to a grand negative UTI. EXAM: CT ABDOMEN AND PELVIS WITHOUT CONTRAST TECHNIQUE: Multidetector CT imaging of the abdomen and pelvis was performed following the standard protocol without IV contrast. COMPARISON:  October 21, 2017 FINDINGS: Lower chest: The lung bases are clear. The heart size is normal. Hepatobiliary: The liver is normal. There is cholelithiasis. There is mild haziness about the gallbladder.There is no biliary ductal dilation. Pancreas: Normal contours without ductal dilatation. No peripancreatic fluid collection. Spleen: Unremarkable. Adrenals/Urinary Tract: --Adrenal glands: Unremarkable. --Right kidney/ureter: There is a punctate nonobstructing stone. --Left kidney/ureter: No hydronephrosis or radiopaque kidney stones. --Urinary bladder: Unremarkable. Stomach/Bowel: --Stomach/Duodenum: No hiatal hernia or other gastric abnormality. Normal duodenal course and caliber. --Small bowel: Unremarkable. --Colon: There is an above average amount of stool in the colon. There is liquid stool in the transverse colon. --Appendix: Normal. Vascular/Lymphatic: Atherosclerotic calcification is present within the non-aneurysmal abdominal aorta, without hemodynamically significant stenosis. --No retroperitoneal lymphadenopathy. --No mesenteric lymphadenopathy.  --No pelvic or inguinal lymphadenopathy. Reproductive: Brachytherapy beads are noted in the prostate gland. Other: There are fat containing bilateral inguinal hernias. There are stable postsurgical changes in the left inguinal region. Musculoskeletal. No acute displaced fractures. IMPRESSION: 1. Cholelithiasis with mild haziness about the gallbladder. If there is concern for acute cholecystitis, recommend further evaluation with right upper quadrant ultrasound. 2. Nonobstructive right nephrolithiasis. 3. Large stool burden. Liquid stool in the transverse colon consistent with a possible underlying diarrheal illness. 4. Additional chronic findings as detailed above. Aortic Atherosclerosis (ICD10-I70.0). Electronically Signed   By: Constance Holster M.D.   On: 08/31/2020 00:06   DG Chest 2 View  Result Date: 08/30/2020 CLINICAL DATA:  Chest pain and dyspnea EXAM: CHEST - 2 VIEW COMPARISON:  02/21/2020 chest radiograph. FINDINGS: Stable configuration of 2 lead left subclavian pacemaker. Stable cardiomediastinal silhouette with mild cardiomegaly. No pneumothorax. No pleural effusion. Lungs appear clear, with no acute consolidative airspace disease and no pulmonary edema. IMPRESSION: Stable mild cardiomegaly without pulmonary edema. No active pulmonary disease. Electronically Signed   By: Ilona Sorrel M.D.   On: 08/30/2020 15:00   CUP PACEART REMOTE DEVICE CHECK  Result Date: 08/21/2020 Scheduled remote reviewed. Normal device function.  Known AF, on [place], programmed DDI.  There was one NSVT arrhythmia detected that was greater than 10 beats, sent to triage. Next remote 91 days. Kathy Breach, RN, CCDS, [employer]  US Abdomen Limited RUQ (LIVER/GB)  Result Date: 08/31/2020 CLINICAL DATA:  Cholecystitis EXAM: ULTRASOUND ABDOMEN LIMITED RIGHT UPPER QUADRANT COMPARISON:  08/30/2020 CT abdomen/pelvis. FINDINGS: Gallbladder: A few layering shadowing gallstones in the gallbladder, largest 1.2 cm. No  definite gallbladder wall thickening (gallbladder wall thickness 2.6 mm). No sonographic Murphy's sign. No pericholecystic fluid. Common bile duct: Diameter: 3 mm Liver: Diffusely echogenic liver parenchyma. No definite liver surface irregularity. No liver mass, noting decreased sensitivity in the setting of an echogenic liver. Portal vein is patent on color Doppler imaging with normal direction of blood flow towards the liver. Other: None. IMPRESSION: 1. Cholelithiasis.  No sonographic evidence of acute cholecystitis. 2. No biliary ductal dilatation. 3. Echogenic liver, a nonspecific finding that can be  due to hepatic steatosis and/or fibrosis. Recommend correlation with liver function tests. Consider outpatient hepatic elastography for further liver fibrosis risk stratification, as clinically warranted. Electronically Signed   By: Ilona Sorrel M.D.   On: 08/31/2020 09:25    Lab Data:  CBC: Recent Labs  Lab 08/30/20 1434 08/31/20 0255  WBC 14.4* 6.1  NEUTROABS  --  4.7  HGB 10.9* 8.8*  HCT 36.7* 29.6*  MCV 94.3 94.9  PLT 189 626*   Basic Metabolic Panel: Recent Labs  Lab 08/30/20 1434 08/30/20 2231 08/31/20 0255 09/01/20 0645  NA 138 135 140 138  K 5.8* 5.3* 5.2* 4.8  CL 113* 112* 114* 111  CO2 19* 16* 19* 18*  GLUCOSE 108* 98 113* 108*  BUN 24* 28* 30* 25*  CREATININE 1.43* 1.60* 1.92* 1.68*  CALCIUM 8.8* 8.0* 8.2* 8.7*  MG  --   --  1.7  --   PHOS  --  2.7  --   --    GFR: Estimated Creatinine Clearance: 50.2 mL/min (A) (by C-G formula based on SCr of 1.68 mg/dL (H)). Liver Function Tests: Recent Labs  Lab 08/30/20 1434 08/30/20 2231 08/31/20 0255  AST 23  --  23  ALT 36  --  36  ALKPHOS 123  --  97  BILITOT 0.8  --  0.8  PROT 5.8*  --  4.8*  ALBUMIN 3.2* 2.7* 2.6*   No results for input(s): LIPASE, AMYLASE in the last 168 hours. No results for input(s): AMMONIA in the last 168 hours. Coagulation Profile: Recent Labs  Lab 08/30/20 1425 08/31/20 0255  INR  1.3* 1.7*   Cardiac Enzymes: No results for input(s): CKTOTAL, CKMB, CKMBINDEX, TROPONINI in the last 168 hours. BNP (last 3 results) Recent Labs    06/03/20 1520  PROBNP 948*   HbA1C: No results for input(s): HGBA1C in the last 72 hours. CBG: No results for input(s): GLUCAP in the last 168 hours. Lipid Profile: No results for input(s): CHOL, HDL, LDLCALC, TRIG, CHOLHDL, LDLDIRECT in the last 72 hours. Thyroid Function Tests: No results for input(s): TSH, T4TOTAL, FREET4, T3FREE, THYROIDAB in the last 72 hours. Anemia Panel: No results for input(s): VITAMINB12, FOLATE, FERRITIN, TIBC, IRON, RETICCTPCT in the last 72 hours. Urine analysis:    Component Value Date/Time   COLORURINE YELLOW 08/30/2020 1820   APPEARANCEUR CLEAR 08/30/2020 1820   LABSPEC 1.025 08/30/2020 1820   PHURINE 5.5 08/30/2020 1820   GLUCOSEU NEGATIVE 08/30/2020 1820   HGBUR MODERATE (A) 08/30/2020 1820   BILIRUBINUR NEGATIVE 08/30/2020 1820   KETONESUR NEGATIVE 08/30/2020 1820   PROTEINUR 30 (A) 08/30/2020 1820   UROBILINOGEN 0.2 12/05/2014 1052   NITRITE POSITIVE (A) 08/30/2020 1820   LEUKOCYTESUR MODERATE (A) 08/30/2020 1820     Sheyna Pettibone M.D. [facility] 09/01/2020, 12:48 PM   Call night coverage person covering after 7pm

## 2020-09-02 DIAGNOSIS — I5033 Acute on chronic diastolic (congestive) heart failure: Secondary | ICD-10-CM

## 2020-09-02 DIAGNOSIS — K819 Cholecystitis, unspecified: Secondary | ICD-10-CM | POA: Diagnosis not present

## 2020-09-02 DIAGNOSIS — I4821 Permanent atrial fibrillation: Secondary | ICD-10-CM

## 2020-09-02 DIAGNOSIS — N1831 Chronic kidney disease, stage 3a: Secondary | ICD-10-CM | POA: Diagnosis not present

## 2020-09-02 DIAGNOSIS — I5032 Chronic diastolic (congestive) heart failure: Secondary | ICD-10-CM | POA: Diagnosis not present

## 2020-09-02 LAB — URINE CULTURE: Culture: NO GROWTH

## 2020-09-02 LAB — BASIC METABOLIC PANEL
Anion gap: 9 (ref 5–15)
BUN: 22 mg/dL (ref 8–23)
CO2: 18 mmol/L — ABNORMAL LOW (ref 22–32)
Calcium: 8.7 mg/dL — ABNORMAL LOW (ref 8.9–10.3)
Chloride: 113 mmol/L — ABNORMAL HIGH (ref 98–111)
Creatinine, Ser: 1.75 mg/dL — ABNORMAL HIGH (ref 0.61–1.24)
GFR, Estimated: 40 mL/min — ABNORMAL LOW (ref >60)
Glucose, Bld: 105 mg/dL — ABNORMAL HIGH (ref 70–99)
Potassium: 4.5 mmol/L (ref 3.5–5.1)
Sodium: 140 mmol/L (ref 135–145)

## 2020-09-02 LAB — CBC
HCT: 29.2 % — ABNORMAL LOW (ref 39.0–52.0)
Hemoglobin: 9.1 g/dL — ABNORMAL LOW (ref 13.0–17.0)
MCH: 29.2 pg (ref 26.0–34.0)
MCHC: 31.2 g/dL (ref 30.0–36.0)
MCV: 93.6 fL (ref 80.0–100.0)
Platelets: 154 10*3/uL (ref 150–400)
RBC: 3.12 MIL/uL — ABNORMAL LOW (ref 4.22–5.81)
RDW: 16.3 % — ABNORMAL HIGH (ref 11.5–15.5)
WBC: 4.9 10*3/uL (ref 4.0–10.5)
nRBC: 0 % (ref 0.0–0.2)

## 2020-09-02 MED ORDER — SODIUM CHLORIDE 0.9 % IV SOLN
INTRAVENOUS | Status: DC | PRN
  Administered 2020-09-02: 500 mL via INTRAVENOUS

## 2020-09-02 MED ORDER — FUROSEMIDE 10 MG/ML IJ SOLN
40.0000 mg | Freq: Two times a day (BID) | INTRAMUSCULAR | Status: AC
  Administered 2020-09-02 (×2): 40 mg via INTRAVENOUS
  Filled 2020-09-02 (×2): qty 4

## 2020-09-02 MED ORDER — POTASSIUM CHLORIDE CRYS ER 20 MEQ PO TBCR
20.0000 meq | EXTENDED_RELEASE_TABLET | Freq: Once | ORAL | Status: AC
  Administered 2020-09-02: 20 meq via ORAL
  Filled 2020-09-02: qty 1

## 2020-09-02 MED ORDER — GERHARDT'S BUTT CREAM
TOPICAL_CREAM | CUTANEOUS | Status: DC | PRN
  Filled 2020-09-02: qty 1

## 2020-09-02 NOTE — Consult Note (Addendum)
Advanced Heart Failure Team Consult Note   Primary Physician: [name], MD PCP-Cardiologist:  [name], MD  Reason for Consultation: Heart Failure   HPI:    Eric Mayer is seen today for evaluation of heart failure at the request of Dr Tana Coast.   Eric Mayer is a 80 year old with a history of morbid obesity s/p gastric bypass 1980s, permanent AF s/p AVN ablation in 5/20 (unable to get CHB) and CRT, HTN, OSA on CPAP, prostate CA and diastolic HF.   Saw Dr Haroldine Laws 07/14/20 for evaluation of dyspnea and diastolic heart failure. PYP was negative for TTR. Started on jardiance and bumex was held. Weight at that time was 280 pounds.   Weight at home has been 279-284 pounds. SBP 100s. Remains SOB with exeriton.   On 1/15 had chest discomfort. Called 911. Had fever, leukocytosis, lactic acidosis, and UTI. COVID negative. Given IVF and started on ceftriaxone. Weight has been trending up and has developed lower extremity edema. Given 20 mg IV lasix earlier this morning.   Cardiac Test:  Echo 04/2020 EF 60 to 65% with severe LVH. The septum was 2.5 cm in the posterior wall 2.1 cm.  PYP 05/2020 scan negative for TTR  Cath 2004- normal cors. Myoview 9/19. EF 60% no scar/ischemia   Review of Systems: [y] = yes, [ ]  = no   . General: Weight gain [ ] ; Weight loss [ ] ; Anorexia [ ] ; Fatigue [ Y]; Fever [ ] ; Chills [ ] ; Weakness [ Y]  . Cardiac: Chest pain/pressure [ ] ; Resting SOB [ ] ; Exertional SOB [Y]; Orthopnea [ ] ; Pedal Edema [Y ]; Palpitations [ ] ; Syncope [ ] ; Presyncope [ ] ; Paroxysmal nocturnal dyspnea[ ]   . Pulmonary: Cough [ ] ; Wheezing[ ] ; Hemoptysis[ ] ; Sputum [ ] ; Snoring [ ]   . GI: Vomiting[ ] ; Dysphagia[ ] ; Melena[ ] ; Hematochezia [ ] ; Heartburn[ ] ; Abdominal pain [ ] ; Constipation [ ] ; Diarrhea [ ] ; BRBPR [ ]   . GU: Hematuria[ ] ; Dysuria [Y ]; Nocturia[ ]   . Vascular: Pain in legs with walking [ ] ; Pain in feet with lying flat [ ] ; Non-healing sores [ ] ; Stroke  [ ] ; TIA [ ] ; Slurred speech [ ] ;  . Neuro: Headaches[ ] ; Vertigo[ ] ; Seizures[ ] ; Paresthesias[ ] ;Blurred vision [ ] ; Diplopia [ ] ; Vision changes [ ]   . Ortho/Skin: Arthritis [ ] ; Joint pain [ Y]; Muscle pain [ ] ; Joint swelling [ ] ; Back Pain [Y ]; Rash [ ]   . Psych: Depression[ ] ; Anxiety[ ]   . Heme: Bleeding problems [ ] ; Clotting disorders [ ] ; Anemia [ ]   . Endocrine: Diabetes [ ] ; Thyroid dysfunction[ ]   Home Medications Prior to Admission medications   Medication Sig Start Date End Date Taking? Authorizing Provider  acetaminophen (TYLENOL) 500 MG tablet Take 1,000 mg by mouth every 6 (six) hours as needed for headache (pain).   Yes [provider]  amoxicillin (AMOXIL) 500 MG capsule Take 2,000 mg by mouth See admin instructions. Take 4 capsules (2000 mg) by mouth one hour prior to dental appointments   Yes [provider]  apixaban (ELIQUIS) 5 MG TABS tablet Take 1 tablet (5 mg total) by mouth 2 (two) times daily. Resume with Sunday evening dose. 02/24/20  Yes Shirley Friar, PA-C  buPROPion (WELLBUTRIN XL) 300 MG 24 hr tablet Take 300 mg by mouth daily.    Yes [provider]  Cholecalciferol (VITAMIN D3) 125 MCG (5000 UT) TABS Take 5,000  Units by mouth daily.    Yes [provider]  clotrimazole (LOTRIMIN) 1 % cream Apply 1 application topically every 3 (three) days.   Yes [provider]  cyanocobalamin (,VITAMIN B-12,) 1000 MCG/ML injection Inject 1,000 mcg into the muscle every 30 (thirty) days.   Yes [provider]  digoxin (LANOXIN) 0.125 MG tablet Take 1 tablet (0.125 mg total) by mouth daily. 06/03/20  Yes Evans Lance, MD  empagliflozin (JARDIANCE) 10 MG TABS tablet Take 1 tablet (10 mg total) by mouth daily before breakfast. 07/14/20  Yes [name], [name], MD  escitalopram (LEXAPRO) 10 MG tablet Take 10 mg by mouth daily.   Yes [provider]  magnesium oxide (MAG-OX) 400 MG tablet TAKE 2 TABLETS  EACH MORNING AND 1 TABLETEACH EVENING. Patient taking differently: Take 400-800 mg by mouth See admin instructions. Take 2 tablets (800 mg) by mouth every morning and 1 tablet (400 mg) at night 04/18/20  Yes Allred, Jeneen Rinks, MD  metoprolol tartrate (LOPRESSOR) 25 MG tablet Take 25 mg by mouth See admin instructions. Take one tablet (25 mg) by mouth with a 50 mg tablet twice daily for a total dose of 75 mg   Yes [provider]  metoprolol tartrate (LOPRESSOR) 50 MG tablet Take 50 mg by mouth See admin instructions. Take one tablet (50mg ) by mouth with a 25 mg tablet twice daily for a total dose of 75 mg 03/20/20  Yes [provider]  mirabegron ER (MYRBETRIQ) 50 MG TB24 tablet Take 50 mg by mouth daily.   Yes [provider]  Multiple Vitamin (MULTIVITAMIN WITH MINERALS) TABS tablet Take 1 tablet by mouth daily.   Yes [provider]  Multiple Vitamins-Minerals (PRESERVISION AREDS 2 PO) Take 1 capsule by mouth 2 (two) times a day.   Yes [provider]  Polyethyl Glycol-Propyl Glycol (SYSTANE ULTRA) 0.4-0.3 % SOLN Place 1 drop into both eyes daily as needed (for dry eyes).    Yes [provider]  potassium chloride SA (KLOR-CON) 20 MEQ tablet Take 1 tablet (20 mEq total) by mouth 2 (two) times daily. Patient taking differently: Take 20 mEq by mouth daily. 09/18/19  Yes Turner, Eber Hong, MD  sacubitril-valsartan (ENTRESTO) 24-26 MG Take 1 tablet by mouth 2 (two) times daily. 05/07/20  Yes [name], MD  tamsulosin (FLOMAX) 0.4 MG CAPS capsule Take 0.8 mg by mouth at bedtime.  11/04/16  Yes [provider]    Past Medical History: Past Medical History:  Diagnosis Date  . Anemia 1980s X 1  . Arthritis    "hands, knees, hips, ankles" (07/31/2015)  . Atrial flutter ([place]) 07/31/2015  . CHF (congestive heart failure) ([place])   . Chronic diastolic heart failure ([place])    a. 11/2015: Echo w/ EF of 60-65%, no WMA, Grade 2 DD, trivial AR, ascending  aorta mildly dilated.   . Depression   . History of cardiovascular stress test    a. 03/2015: Dobutamine Stress Echo with no evidence of ischemia.   [name] History of gastric bypass   . History of peptic ulcer    1980's  . History of radiation therapy 1993   sarcoma of left groin, tx [facility]  . History of sarcoma    1993  LEFT GOIN--  S/P SURGERY, RADIATION AND CHEMO IN [place]  . Hypertension   . Hypogonadism in male   . Incomplete right bundle branch block   . Nerve injury    SURGICAL NERVE INJURY  S/P  LEFT GOIN REMOVAL SARCOMA 1992--  RESIDUAL WEAKNESS AND NUMBNESS UPPER LEFT LEG  . Nocturia   . OSA on CPAP   . Persistent atrial fibrillation ([place])       . 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 Primary prostate adenocarcinoma ([place]) DX 07/08/14   Gleason 7,  stage T1c  . PVC's (premature ventricular contractions) 11/21/2015  . Sarcoma ([place]) ~ 1993/1994   "of groin"  . Weakness of left leg    SECONDARY TO SURGICAL NERVE INJURY OF LEFT GOIN  . Wears glasses     Past Surgical History: Past Surgical History:  Procedure Laterality Date  . ATRIAL FIBRILLATION ABLATION N/A 06/08/2018   Procedure: ATRIAL FIBRILLATION ABLATION;  Surgeon: Thompson Grayer, MD;  Location: [place] [facility];  Service: Cardiovascular;  Laterality: N/A;  . ATRIAL FIBRILLATION ABLATION N/A 12/20/2018   Procedure: ATRIAL FIBRILLATION ABLATION;  Surgeon: Thompson Grayer, MD;  Location: [place] [facility];  Service: Cardiovascular;  Laterality: N/A;  . AV NODE ABLATION  02/20/2020  . AV NODE ABLATION N/A 02/20/2020   Procedure: AV NODE ABLATION;  Surgeon: Evans Lance, MD;  Location: [place] [facility];  Service: Cardiovascular;  Laterality: N/A;  . CARDIAC CATHETERIZATION  08-12-2003  dr Tressia Miners turner   Normal coronary arteries, normal wall motion, no sig. abnormalities  . CARDIOVERSION N/A 08/04/2015   Procedure: CARDIOVERSION;  Surgeon: [name], MD;  Location: [place];  Service:  Cardiovascular;  Laterality: N/A;  . CARDIOVERSION N/A 10/28/2017   Procedure: CARDIOVERSION;  Surgeon: Larey Dresser, MD;  Location: [facility] ENDOSCOPY;  Service: Cardiovascular;  Laterality: N/A;  . CARDIOVERSION N/A 04/12/2018   Procedure: CARDIOVERSION;  Surgeon: Thayer Headings, MD;  Location: [facility] ENDOSCOPY;  Service: Cardiovascular;  Laterality: N/A;  . CARDIOVERSION N/A 07/26/2018   Procedure: CARDIOVERSION;  Surgeon: Larey Dresser, MD;  Location: [facility] ENDOSCOPY;  Service: Cardiovascular;  Laterality: N/A;  . CARDIOVERSION N/A 10/09/2018   Procedure: CARDIOVERSION;  Surgeon: Sueanne Margarita, MD;  Location: [facility] ENDOSCOPY;  Service: Cardiovascular;  Laterality: N/A;  . COLONOSCOPY  07-03-2002  . [place]  . FRACTURE SURGERY    . GROIN DISSECTION Left 1992   [place]   SARCOMA SURGERY  . INGUINAL HERNIA REPAIR Right 1950s?  . INSERT / REPLACE / REMOVE PACEMAKER  02/20/2020  . INTESTINAL BYPASS  1976   GASTRIC FOR OBESITY  . JOINT REPLACEMENT    . PACEMAKER IMPLANT N/A 02/20/2020   Procedure: PACEMAKER IMPLANT;  Surgeon: Evans Lance, MD;  Location: [place] [facility];  Service: Cardiovascular;  Laterality: N/A;  . PATELLA FRACTURE SURGERY Left ~ 1995   "broke it twice; only had OR once"  . PROSTATE BIOPSY  06/2014  . RADIOACTIVE SEED IMPLANT N/A 10/17/2014   Procedure: RADIOACTIVE SEED IMPLANT    ;  Surgeon: Ailene Rud, MD;  Location: [facility];  Service: Urology;  Laterality: N/A;   70 SEEDS IMPLANTED   . TEE WITHOUT CARDIOVERSION N/A 10/28/2017   Procedure: TRANSESOPHAGEAL ECHOCARDIOGRAM (TEE);  Surgeon: Larey Dresser, MD;  Location: [facility] ENDOSCOPY;  Service: Cardiovascular;  Laterality: N/A;  . TONSILLECTOMY  1950s  . TOTAL KNEE ARTHROPLASTY Right 12/17/2014   Procedure: TOTAL KNEE ARTHROPLASTY;  Surgeon: Melrose Nakayama, MD;  Location: [place];  Service: Orthopedics;  Laterality: Right;  . TRANSTHORACIC ECHOCARDIOGRAM  08-08-2003    moderate LVH/  ef 55-65%/  mild Eric/  moderate LAE/  trivial TR/  trivial pericardial effusion posterior to the heart  . [place]  . VIDEO BRONCHOSCOPY Bilateral 09/21/2016   Procedure: VIDEO BRONCHOSCOPY WITHOUT FLUORO;  Surgeon: Collene Gobble, MD;  Location: [place];  Service: Cardiopulmonary;  Laterality: Bilateral;    Family History: Family History  Problem Relation Age of Onset  . Liver disease Mother   . Renal Disease Mother   . Stroke Mother   . Atrial fibrillation Father   . Cancer Sister     Social History: Social History   Socioeconomic History  . Marital status: Married    Spouse name: Not on file  . Number of children: Not on file  . Years of education: Not on file  . Highest education level: Not on file  Occupational History  . Not on file  Tobacco Use  . Smoking status: Never Smoker  . Smokeless tobacco: Never Used  Vaping Use  . Vaping Use: Never used  Substance and Sexual Activity  . Alcohol use: No  . Drug use: No  . Sexual activity: Not Currently  Other Topics Concern  . 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 Ace Inhibitors Cough  . Xarelto [Rivaroxaban] Other (See Comments)    Joint pain    Objective:    Vital Signs:   Temp:  [97.7 F (36.5 C)-98.7 F (37.1 C)] 97.7 F (36.5 C) (01/18 0446) Pulse Rate:  [72-73] 73 (01/18 0446) Resp:  [18-20] 20 (01/18 0446) BP: (98-115)/(35-48) 110/35 (01/18 0446) SpO2:  [97 %-99 %] 98 % (01/18 0446) Weight:  [129.5 kg] 129.5 kg (01/18 0441) Last BM Date: 09/01/20  Weight change: Filed Weights   08/31/20 0447  09/01/20 0533 09/02/20 0441  Weight: 131.6 kg 131.4 kg 129.5 kg    Intake/Output:   Intake/Output Summary (Last 24 hours) at 09/02/2020 1005 Last data filed at 09/02/2020 0858 Gross per 24 hour  Intake 1437.62 ml  Output 1175 ml  Net 262.62 ml      Physical Exam    General:  Sitting in the chair.  No resp difficulty HEENT: normal Neck: supple. JVP 11-12 . Carotids 2+ bilat; no bruits. No lymphadenopathy or thyromegaly appreciated. Cor: PMI nondisplaced. Irregular rate & rhythm. No rubs, gallops or murmurs. Lungs: clear on room air.  Abdomen: soft, nontender, nondistended. No hepatosplenomegaly. No bruits or masses. Good bowel sounds. Extremities: no cyanosis, clubbing, rash, R and LLE 1+ edema Neuro: alert & orientedx3, cranial nerves grossly intact. moves all 4 extremities w/o difficulty. Affect pleasant   Telemetry   A fib 70s   EKG      Labs   Basic Metabolic Panel: Recent Labs  Lab 08/30/20 1434 08/30/20 2231 08/31/20 0255 09/01/20 0645 09/02/20 0440  NA 138 135 140 138 140  K 5.8* 5.3* 5.2* 4.8 4.5  CL 113* 112* 114* 111 113*  CO2 19* 16* 19* 18* 18*  GLUCOSE 108* 98 113* 108* 105*  BUN 24* 28* 30* 25* 22  CREATININE 1.43* 1.60* 1.92* 1.68* 1.75*  CALCIUM 8.8* 8.0* 8.2* 8.7* 8.7*  MG  --   --  1.7  --   --   PHOS  --  2.7  --   --   --     Liver Function Tests: Recent Labs  Lab 08/30/20 1434 08/30/20 2231 08/31/20 0255  AST 23  --  23  ALT 36  --  36  ALKPHOS 123  --  97  BILITOT 0.8  --  0.8  PROT 5.8*  --  4.8*  ALBUMIN 3.2* 2.7* 2.6*   No results for input(s): LIPASE, AMYLASE in the last 168 hours. No results for input(s): AMMONIA in the last 168 hours.  CBC: Recent Labs  Lab 08/30/20 1434 08/31/20 0255 09/02/20 0440  WBC 14.4* 6.1 4.9  NEUTROABS  --  4.7  --   HGB 10.9* 8.8* 9.1*  HCT 36.7* 29.6* 29.2*  MCV 94.3 94.9 93.6  PLT 189 127* 154    Cardiac Enzymes: No results for input(s): CKTOTAL, CKMB, CKMBINDEX, TROPONINI in  the last 168 hours.  BNP: BNP (last 3 results) Recent Labs    04/22/20 1451  BNP 151.1*    ProBNP (last 3 results) Recent Labs    06/03/20 1520  PROBNP 948*     CBG: No results for input(s): GLUCAP in the last 168 hours.  Coagulation Studies: Recent Labs    08/30/20 1425 08/31/20 0255  LABPROT 16.0* 19.0*  INR 1.3* 1.7*     Imaging    No results found.   Medications:     Current Medications: . apixaban  5 mg Oral BID  . buPROPion  300 mg Oral Daily  . digoxin  0.125 mg Oral Daily  . escitalopram  10 mg Oral Daily  . furosemide  20 mg Intravenous Daily  . metoprolol tartrate  25 mg Oral BID  . mirabegron ER  50 mg Oral Daily  . tamsulosin  0.8 mg Oral QHS     Infusions: . piperacillin-tazobactam (ZOSYN)  IV 3.375 g (09/01/20 2302)      Assessment/Plan   1. Urosepsis, lactic acidosis.  -Lactic acid 3.4>1.4 . Given fluid resuscitation.  -Urine culture pending -On zosyn.   2. A/C Diastolic Heart Failure  Echo EF 60 to 65% with severe LVH. The septum was 2.5 cm in the posterior wall 2.1 cm.RV ok - PYP 10/21 negative  Volume status mildly elevated. Given 20 mg IV lasix this morning. Needs more diuresis. Give another 40 mg IV lasix and anticipate switching to torsemide.  -Plan to keep off jardiance with UTI.  Renal function improving.   4. AKI  - Creatinine baseline 1.2-1.3  -Creatinine on admit 1.4  -Todays creatinine 1.75.   5. Permanent A fib  -permanent AF s/p AVN ablation in 5/20 (unable to get CHB) and CRT, -Rate controlled. On metoprolol + dig. Has been on dig long term. Check Dig level.  -Continue eliquis 5 mg twice a day.   6. OSA  -Uses CPAP   7. Restrictive lung disease -PFTs 5/21: FEV1 1.66 (59%) FVC 2.28 (59%) DLCO 69%  8. Morbid Obesity  -s/p gastric bypass Body mass index is 40.95 kg/m.   Length of Stay: 2  Darrick Grinder, NP  09/02/2020, 10:05 AM  Advanced Heart Failure Team Pager [phone] (M-F; 7a - 4p)  Please  contact [facility] for night-coverage after hours (4p -7a ) and weekends on [email or web address]  Patient seen with NP, agree with the above note.   Patient has history of diastolic CHF (negative workup for cardiac amyloidosis), CKD stage 3, permanent atrial fibrillation s/p AV  nodal ablation with MDT DDD PPM (failed to obtain complete heart block so still requiring nodal blockade).    He was admitted with urosepsis, hypotension/elevated PCT/elevated lactate.  Blood cultures negative, urine cultures still pending.   He has not been on a loop diuretic at home, stopped Bumex a couple months ago.  He has been on Jardiance.  Jardiance and Entresto on hold with urosepsis/low BP.  He feels more short of breath today and has increased leg swelling.   Creatinine up to 1.9 initially, now 1.75.   General: NAD Neck: JVP 10 cm with HJR, no thyromegaly or thyroid nodule.  Lungs: Decreased at bases.  CV: Nondisplaced PMI.  Heart regular S1/S2, no S3/S4, no murmur.  1+ edema to knees.   Abdomen: Soft, nontender, no hepatosplenomegaly, no distention.  Skin: Intact without lesions or rashes.  Neurologic: Alert and oriented x 3.  Psych: Normal affect. Extremities: No clubbing or cyanosis.  HEENT: Normal.   1. Urosepsis: Presentation consistent with urosepsis, UTI by UA and elevated PCT/lactate, but still waiting for urine culture.  - Zosyn per primary service.  2. Acute on chronic diastolic CHF: Last echo in 9/21 with EF 60-65%, severe LVH, normal RV.  Workup negative for amyloidosis in the past.  With resuscitation from urosepsis, he is now volume overloaded on exam and short of breath.  Creatinine up to 1.9, now down to 1.75.  - He got Lasix 20 mg IV this morning, will start 40 mg IV bid now and follow response.  - Leave off Jardiance with this UTI.  - Will need to start torsemide eventually.  - BP remains soft (SBP 90s-100s), will leave off Entresto.  3. AKI on CKD3: Renal function improving.   4. Atrial  fibrillation: Permanent.  Has had AV nodal ablation with MDT PPM but did not get CHB so still needs rate control.  - He has been on digoxin long-term for rate control.  Can continue for now but check level.  - Continue metoprolol 25 mg bid.  Can eventually increase dose to home dose if BP stabilizes further.  - Continue Eliquis.   [name] 09/02/2020 11:49 AM

## 2020-09-02 NOTE — Evaluation (Signed)
Physical Therapy Evaluation Patient Details Name: Eric Mayer MRN: [id number] DOB: 06/21/1941 Today's Date: 09/02/2020   History of Present Illness  Patient is a 80 y/o male who presents with dysuria, hematuria, chest discomfort and complaints of low blood pressure. Found to have urosepsis, lactic acidosis, acute on chronic diastolic heart failure and acute on chronic kidney injury. PMH includes HTN, incomplete RBBB, sarcoma left groin, depression, prostate ca, OSA, permanent A-fib (S/P ablation x 2, pacemaker placement 02/2020), CKD stage IIIa, CHF.  Clinical Impression  Patient presents with generalized weakness, impaired sensation, dyspnea on exertion, decreased cardiovascular endurance and impaired mobility s/p above. Pt lives at home with spouse and uses RW vs [facility] for ambulation and electric scooter for community ambulation. Today, pt tolerated bed mobility, transfers and gait training with Min A-supervision for safety and use of RW. Noted to have 3/4 DOE with ambulation but Sp02 remained in high 90s. Pt with appropriate BP response to activity. Encouraged walking to bathroom with nursing and increasing activity while in the hospital. Will follow acutely to maximize independence and mobility prior to return home.    Follow Up Recommendations No PT follow up;Supervision - Intermittent    Equipment Recommendations  None recommended by PT    Recommendations for Other Services       Precautions / Restrictions Precautions Precautions: Other (comment) Precaution Comments: soft BP Restrictions Weight Bearing Restrictions: No      Mobility  Bed Mobility Overal bed mobility: Needs Assistance Bed Mobility: Supine to Sit     Supine to sit: HOB elevated;Supervision     General bed mobility comments: Supervision for safety, use of rail.    Transfers Overall transfer level: Needs assistance Equipment used: Rolling walker (2 wheeled) Transfers: Sit to/from Stand Sit to Stand: Min  assist         General transfer comment: Min A to power to standing from low bed with increased effort and 2 attempts (normally uses lift chair). Transferred to chair post ambulation.  Ambulation/Gait Ambulation/Gait assistance: Supervision Gait Distance (Feet): 125 Feet Assistive device: Rolling walker (2 wheeled) Gait Pattern/deviations: Step-through pattern;Decreased stride length;Trunk flexed Gait velocity: good speed Gait velocity interpretation: 1.31 - 2.62 ft/sec, indicative of limited community ambulator General Gait Details: Steady gait with RW for support; cues for RW proximity. 3/4 DOE. Sp02 100% on RA.  Stairs            Wheelchair Mobility    Modified Rankin (Stroke Patients Only)       Balance Overall balance assessment: Needs assistance Sitting-balance support: Feet supported;No upper extremity supported Sitting balance-Leahy Scale: Good     Standing balance support: During functional activity Standing balance-Leahy Scale: Poor Standing balance comment: Requires UE support for standing.                             Pertinent Vitals/Pain Pain Assessment: Faces Faces Pain Scale: Hurts a little bit Pain Location: bottom Pain Descriptors / Indicators: Sore Pain Intervention(s): Monitored during session;Repositioned    Home Living Family/patient expects to be discharged to:: Private residence Living Arrangements: Spouse/significant other Available Help at Discharge: Family;Available 24 hours/day Type of Home: Apartment Home Access: Level entry     Home Layout: One level Home Equipment: Walker - 2 wheels;Walker - 4 wheels;Other (comment);Shower seat;Toilet riser;Bedside commode Additional Comments: electric scooter for community ambulation    Prior Function Level of Independence: Independent with assistive device(s)  Comments: Uses SPC vs rollator for ambulation. DOes not do any IADLs. Wife drives most the time.     Hand  Dominance   Dominant Hand: Right    Extremity/Trunk Assessment   Upper Extremity Assessment Upper Extremity Assessment: Defer to OT evaluation    Lower Extremity Assessment Lower Extremity Assessment: Generalized weakness (but functional) RLE Sensation: decreased light touch (foot) LLE Sensation: decreased light touch (foot)       Communication   Communication: No difficulties  Cognition Arousal/Alertness: Awake/alert Behavior During Therapy: WFL for tasks assessed/performed Overall Cognitive Status: Within Functional Limits for tasks assessed                                        General Comments General comments (skin integrity, edema, etc.): Sp02 remained >95% on RA, BP stable and increased appropriately post ambulation. Wife present during session.    Exercises     Assessment/Plan    PT Assessment Patient needs continued PT services  PT Problem List Decreased strength;Decreased mobility;Cardiopulmonary status limiting activity;Decreased activity tolerance;Impaired sensation       PT Treatment Interventions Therapeutic exercise;Gait training;Functional mobility training;Therapeutic activities;Patient/family education;Balance training    PT Goals (Current goals can be found in the Care Plan section)  Acute Rehab PT Goals Patient Stated Goal: to get better and go home PT Goal Formulation: With patient Time For Goal Achievement: 09/16/20 Potential to Achieve Goals: Good    Frequency Min 3X/week   Barriers to discharge        Co-evaluation               AM-PAC PT "6 Clicks" Mobility  Outcome Measure Help needed turning from your back to your side while in a flat bed without using bedrails?: A Little Help needed moving from lying on your back to sitting on the side of a flat bed without using bedrails?: A Little Help needed moving to and from a bed to a chair (including a wheelchair)?: A Little Help needed standing up from a chair using  your arms (e.g., wheelchair or bedside chair)?: A Little Help needed to walk in hospital room?: None Help needed climbing 3-5 steps with a railing? : A Little 6 Click Score: 19    End of Session Equipment Utilized During Treatment: Gait belt Activity Tolerance: Patient tolerated treatment well Patient left: in chair;with call bell/phone within reach;with chair alarm set;with family/visitor present Nurse Communication: Mobility status PT Visit Diagnosis: Other abnormalities of gait and mobility (R26.89);Other (comment) (DOE)    Time: 9326-7124 PT Time Calculation (min) (ACUTE ONLY): 26 min   Charges:   PT Evaluation $PT Eval Moderate Complexity: 1 Mod PT Treatments $Gait Training: 8-22 mins        Marisa Severin, PT, DPT Acute Rehabilitation Services Pager [phone] Office [place] 09/02/2020, 2:48 PM

## 2020-09-02 NOTE — Progress Notes (Signed)
[facility]                                                                              Patient Demographics  Eric Mayer, is a 80 y.o. male, DOB - 08/20/1940, OX:[id number]  Admit date - 08/30/2020   Admitting Physician No admitting provider for patient encounter.  Outpatient Primary MD for the patient is [name], MD  Outpatient specialists:   LOS - 2  days   Medical records reviewed and are as summarized below:    Chief Complaint  Patient presents with  . Chest Pain       Brief summary   Patient was admitted 08/31/2019 with concerns for urosepsis but presented because of transient chest pain. 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 Abnormal lactic acid, hypotension, mild which actually had been going on for some time, abnormal urinalysis.  Treated with ceftriaxone changed to Zosyn, haziness around the gallbladder on CT ultrasound checked.  Also acute on chronic kidney injury.  Mild.  Troponins elevated not thought to be from MI.  1/17: BP has been soft, decrease metoprolol and started on Lasix 20 mg IV secondary to fluid overload 1/18: CHF/cardiology consulted    Assessment & Plan    Principal Problem: Sepsis secondary to UTI, POA -Presented with hematuria and dysuria, lactic acidosis, hypotension, AKI -Blood cultures negative so far, urine culture apparently not sent from ED, UA was positive -No prior urine cultures to compare with -CT  abdomen with concern of possible cholecystitis however abdominal ultrasound negative for acute cholecystitis. -Procalcitonin improving 0.74 <-1.1.  Continue IV Zosyn -Discontinue Jardiance with UTIs  Active Problems: Hematuria in the setting of anticoagulation and UTI -Per patient hematuria improving, getting clear now -H&H stable, 9.1  Lactic acidosis Resolved  Acute on chronic diastolic CHF -2D echo 0000000 had shown EF of 60 to 65% -Follows Dr. Haroldine Laws outpatient, diuretics, Delene Loll had been held secondary to hypotension -Now 2+ pitting edema, dyspnea on exertion, orthopnea secondary to fluid overload.  Difficult to do aggressive IV diuresis due to soft BP.  Decrease beta-blocker to 25 mg twice daily, -Requested cardiology consult, appreciate recommendations, increased Lasix to 40 mg IV twice daily   Hypertension BP still soft, decreased metoprolol to 25 mg twice daily  (at home on 75 mg twice daily)   Mild acute on CKD stage IIIa -Baseline creatinine 1.2-1.3, presented with creatinine of 1.9 -Creatinine 1.7 today, follow closely with diuresis   Persistent atrial fibrillation -Heart rate currently controlled, continue digoxin, beta-blocker -Continue eliquis  Obesity Estimated body mass index is 40.95 kg/m as calculated from the following:   Height as of this encounter: 5\' 10"  (1.778 m).   Weight as of this encounter: 129.5 kg.  Code Status: Full CODE STATUS DVT Prophylaxis: Eliquis Family Communication: Discussed all imaging results, lab results, explained to the  patient.  Offered to call his wife, patient stated that he will discuss the plan with her.  Disposition Plan:     Status is: Inpatient  Remains inpatient appropriate because:Inpatient level of care appropriate due to severity of illness   Dispo: The patient is from: Home              Anticipated d/c is to: Home              Anticipated d/c date is: 2 days              Patient currently is not  medically stable to d/c.  Has significant fluid overload, cardiology consulted    Time Spent in minutes   35 minutes  Procedures:  None  Consultants:   Cardiology  Antimicrobials:   Anti-infectives (From admission, onward)   Start     Dose/Rate Route Frequency Ordered Stop   08/31/20 0415  piperacillin-tazobactam (ZOSYN) IVPB 3.375 g       "Followed by" Linked Group Details   3.375 g 12.5 mL/hr over 240 Minutes Intravenous Every 8 hours 08/30/20 2145     08/30/20 2145  piperacillin-tazobactam (ZOSYN) IVPB 3.375 g       "Followed by" Linked Group Details   3.375 g 100 mL/hr over 30 Minutes Intravenous  Once 08/30/20 2145 08/30/20 2336   08/30/20 1600  cefTRIAXone (ROCEPHIN) 1 g in sodium chloride 0.9 % 100 mL IVPB  Status:  Discontinued        1 g 200 mL/hr over 30 Minutes Intravenous Every 24 hours 08/30/20 1556 08/30/20 2145         Medications  Scheduled Meds: . apixaban  5 mg Oral BID  . buPROPion  300 mg Oral Daily  . digoxin  0.125 mg Oral Daily  . escitalopram  10 mg Oral Daily  . furosemide  40 mg Intravenous BID  . metoprolol tartrate  25 mg Oral BID  . mirabegron ER  50 mg Oral Daily  . tamsulosin  0.8 mg Oral QHS   Continuous Infusions: . sodium chloride 500 mL (09/02/20 1102)  . piperacillin-tazobactam (ZOSYN)  IV 3.375 g (09/02/20 1102)   PRN Meds:.sodium chloride, acetaminophen **OR** acetaminophen, ipratropium-albuterol, ondansetron **OR** ondansetron (ZOFRAN) IV, polyethylene glycol      Subjective:   Eric Mayer was seen and examined today.  States hematuria has improved however has significant volume overload with lower extremity edema and shortness of breath.  No acute chest pain, fevers or chills.  BP still soft.  No acute events overnight.  Objective:   Vitals:   09/02/20 0441 09/02/20 0446 09/02/20 1014 09/02/20 1200  BP:  (!) 110/35 (!) 99/49 (!) 99/48  Pulse:  73 72   Resp:  20 20   Temp:  97.7 F (36.5 C)    TempSrc:  Oral     SpO2:  98% 97%   Weight: 129.5 kg     Height:        Intake/Output Summary (Last 24 hours) at 09/02/2020 1231 Last data filed at 09/02/2020 1210 Gross per 24 hour  Intake 1677.62 ml  Output 1675 ml  Net 2.62 ml     Wt Readings from Last 3 Encounters:  09/02/20 129.5 kg  07/14/20 127.3 kg  06/09/20 126.6 kg    Physical Exam  General: Alert and oriented x 3, NAD  Cardiovascular: S1 S2 clear, JVD+  Respiratory: Diminished breath sounds at the bases  Gastrointestinal: Soft, nontender, nondistended, NBS  Ext: 2+  pedal edema bilaterally  Neuro: no new deficits  Musculoskeletal: No cyanosis, clubbing  Skin: No rashes  Psych: Normal affect and demeanor, alert and oriented x3     Data Reviewed:  I have personally reviewed following labs and imaging studies  Micro Results Recent Results (from the past 240 hour(s))  Culture, blood (Routine x 2)     Status: None (Preliminary result)   Collection Time: 08/30/20  2:35 PM   Specimen: BLOOD RIGHT ARM  Result Value Ref Range Status   Specimen Description BLOOD RIGHT ARM  Final   Special Requests   Final    BOTTLES DRAWN AEROBIC AND ANAEROBIC Blood Culture results may not be optimal due to an excessive volume of blood received in culture bottles   Culture   Final    NO GROWTH 3 DAYS Performed at [facility], [place] [address] [place], [place] [address]    Report Status PENDING  Incomplete  Culture, blood (Routine x 2)     Status: None (Preliminary result)   Collection Time: 08/30/20  5:08 PM   Specimen: BLOOD RIGHT HAND  Result Value Ref Range Status   Specimen Description BLOOD RIGHT HAND  Final   Special Requests   Final    BOTTLES DRAWN AEROBIC AND ANAEROBIC Blood Culture results may not be optimal due to an inadequate volume of blood received in culture bottles   Culture   Final    NO GROWTH 3 DAYS Performed at [facility], [place] [address] [place], [place] [address]    Report Status PENDING   Incomplete  Resp Panel by RT-PCR (Flu A&B, Covid) Nasopharyngeal Swab     Status: None   Collection Time: 08/30/20  5:22 PM   Specimen: Nasopharyngeal Swab; Nasopharyngeal(NP) swabs in vial transport medium  Result Value Ref Range Status   SARS Coronavirus 2 by RT PCR NEGATIVE NEGATIVE Final    Comment: (NOTE) SARS-CoV-2 target nucleic acids are NOT DETECTED.  The SARS-CoV-2 RNA is generally detectable in upper respiratory specimens during the acute phase of infection. The lowest concentration of SARS-CoV-2 viral copies this assay can detect is 138 copies/mL. A negative result does not preclude SARS-Cov-2 infection and should not be used as the sole basis for treatment or other patient management decisions. A negative result may occur with  improper specimen collection/handling, submission of specimen other than nasopharyngeal swab, presence of viral mutation(s) within the areas targeted by this assay, and inadequate number of viral copies(<138 copies/mL). A negative result must be combined with clinical observations, patient history, and epidemiological information. The expected result is Negative.  Fact Sheet for Patients:  EntrepreneurPulse.com.au  Fact Sheet for Healthcare Providers:  IncredibleEmployment.be  This test is no t yet approved or cleared by the [place] FDA and  has been authorized for detection and/or diagnosis of SARS-CoV-2 by FDA under an Emergency Use Authorization (EUA). This EUA will remain  in effect (meaning this test can be used) for the duration of the COVID-19 declaration under Section 564(b)(1) of the Act, 21 U.S.C.section 360bbb-3(b)(1), unless the authorization is terminated  or revoked sooner.       Influenza A by PCR NEGATIVE NEGATIVE Final   Influenza B by PCR NEGATIVE NEGATIVE Final    Comment: (NOTE) The Xpert Xpress SARS-CoV-2/FLU/RSV plus assay is intended as an aid in the diagnosis of influenza  from Nasopharyngeal swab specimens and should not be used as a sole basis for treatment. Nasal washings and aspirates are unacceptable for  Xpert Xpress SARS-CoV-2/FLU/RSV testing.  Fact Sheet for Patients: EntrepreneurPulse.com.au  Fact Sheet for Healthcare Providers: IncredibleEmployment.be  This test is not yet approved or cleared by the [place] FDA and has been authorized for detection and/or diagnosis of SARS-CoV-2 by FDA under an Emergency Use Authorization (EUA). This EUA will remain in effect (meaning this test can be used) for the duration of the COVID-19 declaration under Section 564(b)(1) of the Act, 21 U.S.C. section 360bbb-3(b)(1), unless the authorization is terminated or revoked.  Performed at [facility], [place] [address], [place], [place] [address]     Radiology Reports CT ABDOMEN PELVIS WO CONTRAST  Result Date: 08/31/2020 CLINICAL DATA:  Sepsis due to a grand negative UTI. EXAM: CT ABDOMEN AND PELVIS WITHOUT CONTRAST TECHNIQUE: Multidetector CT imaging of the abdomen and pelvis was performed following the standard protocol without IV contrast. COMPARISON:  October 21, 2017 FINDINGS: Lower chest: The lung bases are clear. The heart size is normal. Hepatobiliary: The liver is normal. There is cholelithiasis. There is mild haziness about the gallbladder.There is no biliary ductal dilation. Pancreas: Normal contours without ductal dilatation. No peripancreatic fluid collection. Spleen: Unremarkable. Adrenals/Urinary Tract: --Adrenal glands: Unremarkable. --Right kidney/ureter: There is a punctate nonobstructing stone. --Left kidney/ureter: No hydronephrosis or radiopaque kidney stones. --Urinary bladder: Unremarkable. Stomach/Bowel: --Stomach/Duodenum: No hiatal hernia or other gastric abnormality. Normal duodenal course and caliber. --Small bowel: Unremarkable. --Colon: There is an above average amount of stool in the colon. There  is liquid stool in the transverse colon. --Appendix: Normal. Vascular/Lymphatic: Atherosclerotic calcification is present within the non-aneurysmal abdominal aorta, without hemodynamically significant stenosis. --No retroperitoneal lymphadenopathy. --No mesenteric lymphadenopathy. --No pelvic or inguinal lymphadenopathy. Reproductive: Brachytherapy beads are noted in the prostate gland. Other: There are fat containing bilateral inguinal hernias. There are stable postsurgical changes in the left inguinal region. Musculoskeletal. No acute displaced fractures. IMPRESSION: 1. Cholelithiasis with mild haziness about the gallbladder. If there is concern for acute cholecystitis, recommend further evaluation with right upper quadrant ultrasound. 2. Nonobstructive right nephrolithiasis. 3. Large stool burden. Liquid stool in the transverse colon consistent with a possible underlying diarrheal illness. 4. Additional chronic findings as detailed above. Aortic Atherosclerosis (ICD10-I70.0). Electronically Signed   By: Constance Holster M.D.   On: 08/31/2020 00:06   DG Chest 2 View  Result Date: 08/30/2020 CLINICAL DATA:  Chest pain and dyspnea EXAM: CHEST - 2 VIEW COMPARISON:  02/21/2020 chest radiograph. FINDINGS: Stable configuration of 2 lead left subclavian pacemaker. Stable cardiomediastinal silhouette with mild cardiomegaly. No pneumothorax. No pleural effusion. Lungs appear clear, with no acute consolidative airspace disease and no pulmonary edema. IMPRESSION: Stable mild cardiomegaly without pulmonary edema. No active pulmonary disease. Electronically Signed   By: Ilona Sorrel M.D.   On: 08/30/2020 15:00   CUP PACEART REMOTE DEVICE CHECK  Result Date: 08/21/2020 Scheduled remote reviewed. Normal device function.  Known AF, on [place], programmed DDI.  There was one NSVT arrhythmia detected that was greater than 10 beats, sent to triage. Next remote 91 days. Kathy Breach, RN, CCDS, [employer]  US Abdomen  Limited RUQ (LIVER/GB)  Result Date: 08/31/2020 CLINICAL DATA:  Cholecystitis EXAM: ULTRASOUND ABDOMEN LIMITED RIGHT UPPER QUADRANT COMPARISON:  08/30/2020 CT abdomen/pelvis. FINDINGS: Gallbladder: A few layering shadowing gallstones in the gallbladder, largest 1.2 cm. No definite gallbladder wall thickening (gallbladder wall thickness 2.6 mm). No sonographic Murphy's sign. No pericholecystic fluid. Common bile duct: Diameter: 3 mm Liver: Diffusely echogenic liver parenchyma. No definite liver surface irregularity. No  liver mass, noting decreased sensitivity in the setting of an echogenic liver. Portal vein is patent on color Doppler imaging with normal direction of blood flow towards the liver. Other: None. IMPRESSION: 1. Cholelithiasis.  No sonographic evidence of acute cholecystitis. 2. No biliary ductal dilatation. 3. Echogenic liver, a nonspecific finding that can be due to hepatic steatosis and/or fibrosis. Recommend correlation with liver function tests. Consider outpatient hepatic elastography for further liver fibrosis risk stratification, as clinically warranted. Electronically Signed   By: Ilona Sorrel M.D.   On: 08/31/2020 09:25    Lab Data:  CBC: Recent Labs  Lab 08/30/20 1434 08/31/20 0255 09/02/20 0440  WBC 14.4* 6.1 4.9  NEUTROABS  --  4.7  --   HGB 10.9* 8.8* 9.1*  HCT 36.7* 29.6* 29.2*  MCV 94.3 94.9 93.6  PLT 189 127* 846   Basic Metabolic Panel: Recent Labs  Lab 08/30/20 1434 08/30/20 2231 08/31/20 0255 09/01/20 0645 09/02/20 0440  NA 138 135 140 138 140  K 5.8* 5.3* 5.2* 4.8 4.5  CL 113* 112* 114* 111 113*  CO2 19* 16* 19* 18* 18*  GLUCOSE 108* 98 113* 108* 105*  BUN 24* 28* 30* 25* 22  CREATININE 1.43* 1.60* 1.92* 1.68* 1.75*  CALCIUM 8.8* 8.0* 8.2* 8.7* 8.7*  MG  --   --  1.7  --   --   PHOS  --  2.7  --   --   --    GFR: Estimated Creatinine Clearance: 47.8 mL/min (A) (by C-G formula based on SCr of 1.75 mg/dL (H)). Liver Function Tests: Recent Labs   Lab 08/30/20 1434 08/30/20 2231 08/31/20 0255  AST 23  --  23  ALT 36  --  36  ALKPHOS 123  --  97  BILITOT 0.8  --  0.8  PROT 5.8*  --  4.8*  ALBUMIN 3.2* 2.7* 2.6*   No results for input(s): LIPASE, AMYLASE in the last 168 hours. No results for input(s): AMMONIA in the last 168 hours. Coagulation Profile: Recent Labs  Lab 08/30/20 1425 08/31/20 0255  INR 1.3* 1.7*   Cardiac Enzymes: No results for input(s): CKTOTAL, CKMB, CKMBINDEX, TROPONINI in the last 168 hours. BNP (last 3 results) Recent Labs    06/03/20 1520  PROBNP 948*   HbA1C: No results for input(s): HGBA1C in the last 72 hours. CBG: No results for input(s): GLUCAP in the last 168 hours. Lipid Profile: No results for input(s): CHOL, HDL, LDLCALC, TRIG, CHOLHDL, LDLDIRECT in the last 72 hours. Thyroid Function Tests: No results for input(s): TSH, T4TOTAL, FREET4, T3FREE, THYROIDAB in the last 72 hours. Anemia Panel: No results for input(s): VITAMINB12, FOLATE, FERRITIN, TIBC, IRON, RETICCTPCT in the last 72 hours. Urine analysis:    Component Value Date/Time   COLORURINE YELLOW 08/30/2020 1820   APPEARANCEUR CLEAR 08/30/2020 1820   LABSPEC 1.025 08/30/2020 1820   PHURINE 5.5 08/30/2020 1820   GLUCOSEU NEGATIVE 08/30/2020 1820   HGBUR MODERATE (A) 08/30/2020 1820   BILIRUBINUR NEGATIVE 08/30/2020 1820   KETONESUR NEGATIVE 08/30/2020 1820   PROTEINUR 30 (A) 08/30/2020 1820   UROBILINOGEN 0.2 12/05/2014 1052   NITRITE POSITIVE (A) 08/30/2020 1820   LEUKOCYTESUR MODERATE (A) 08/30/2020 1820     Laray Rivkin M.D. [facility] 09/02/2020, 12:31 PM   Call night coverage person covering after 7pm

## 2020-09-03 DIAGNOSIS — N1831 Chronic kidney disease, stage 3a: Secondary | ICD-10-CM | POA: Diagnosis not present

## 2020-09-03 DIAGNOSIS — I5032 Chronic diastolic (congestive) heart failure: Secondary | ICD-10-CM | POA: Diagnosis not present

## 2020-09-03 DIAGNOSIS — K819 Cholecystitis, unspecified: Secondary | ICD-10-CM | POA: Diagnosis not present

## 2020-09-03 LAB — CBC
HCT: 29.3 % — ABNORMAL LOW (ref 39.0–52.0)
Hemoglobin: 8.8 g/dL — ABNORMAL LOW (ref 13.0–17.0)
MCH: 28.2 pg (ref 26.0–34.0)
MCHC: 30 g/dL (ref 30.0–36.0)
MCV: 93.9 fL (ref 80.0–100.0)
Platelets: 140 10*3/uL — ABNORMAL LOW (ref 150–400)
RBC: 3.12 MIL/uL — ABNORMAL LOW (ref 4.22–5.81)
RDW: 16.2 % — ABNORMAL HIGH (ref 11.5–15.5)
WBC: 7.5 10*3/uL (ref 4.0–10.5)
nRBC: 0 % (ref 0.0–0.2)

## 2020-09-03 LAB — BASIC METABOLIC PANEL
Anion gap: 9 (ref 5–15)
BUN: 20 mg/dL (ref 8–23)
CO2: 17 mmol/L — ABNORMAL LOW (ref 22–32)
Calcium: 8.3 mg/dL — ABNORMAL LOW (ref 8.9–10.3)
Chloride: 114 mmol/L — ABNORMAL HIGH (ref 98–111)
Creatinine, Ser: 1.98 mg/dL — ABNORMAL HIGH (ref 0.61–1.24)
GFR, Estimated: 34 mL/min — ABNORMAL LOW (ref >60)
Glucose, Bld: 98 mg/dL (ref 70–99)
Potassium: 3.9 mmol/L (ref 3.5–5.1)
Sodium: 140 mmol/L (ref 135–145)

## 2020-09-03 LAB — DIGOXIN LEVEL: Digoxin Level: 0.5 ng/mL — ABNORMAL LOW (ref 0.8–2.0)

## 2020-09-03 LAB — MAGNESIUM: Magnesium: 1.5 mg/dL — ABNORMAL LOW (ref 1.7–2.4)

## 2020-09-03 MED ORDER — SODIUM CHLORIDE 0.9 % IV SOLN: INTRAVENOUS | Status: DC

## 2020-09-03 MED ORDER — SODIUM CHLORIDE 0.9% FLUSH
3.0000 mL | Freq: Two times a day (BID) | INTRAVENOUS | Status: DC
  Administered 2020-09-04 (×2): 3 mL via INTRAVENOUS

## 2020-09-03 MED ORDER — MAGNESIUM SULFATE 4 GM/100ML IV SOLN
4.0000 g | Freq: Once | INTRAVENOUS | Status: AC
  Administered 2020-09-03: 4 g via INTRAVENOUS
  Filled 2020-09-03: qty 100

## 2020-09-03 MED ORDER — SODIUM CHLORIDE 0.9% FLUSH: 3.0000 mL | INTRAVENOUS | Status: DC | PRN

## 2020-09-03 MED ORDER — SODIUM CHLORIDE 0.9 % IV SOLN: 250.0000 mL | INTRAVENOUS | Status: DC | PRN

## 2020-09-03 MED ORDER — FUROSEMIDE 10 MG/ML IJ SOLN
40.0000 mg | Freq: Two times a day (BID) | INTRAMUSCULAR | Status: DC
  Administered 2020-09-03 – 2020-09-04 (×3): 40 mg via INTRAVENOUS
  Filled 2020-09-03 (×3): qty 4

## 2020-09-03 MED ORDER — ASPIRIN 81 MG PO CHEW
81.0000 mg | CHEWABLE_TABLET | ORAL | Status: AC
  Administered 2020-09-04: 81 mg via ORAL
  Filled 2020-09-03: qty 1

## 2020-09-03 NOTE — Progress Notes (Signed)
Orthopedic Tech Progress Note Patient Details:  CULLEN LAHAIE 02-23-1941 [id number]  Ortho Devices Type of Ortho Device: Louretta Parma boot Ortho Device/Splint Location: bi-lateral Ortho Device/Splint Interventions: Ordered,Application,Adjustment   Post Interventions Patient Tolerated: Well Instructions Provided: Care of device,Adjustment of device   Karolee Stamps 09/03/2020, 8:15 PM

## 2020-09-03 NOTE — Progress Notes (Addendum)
Advanced Heart Failure Rounding Note  PCP-Cardiologist: [name], MD   Subjective:    Yesterday diuresed with IV lasix. Brisk diuresis noted. Weight down another 6 pounds.   Feels ok but he wasn't able to walk as far. SOB with exertion.   Objective:   Weight Range: 126.8 kg Body mass index is 40.12 kg/m.   Vital Signs:   Temp:  [97.8 F (36.6 C)-98 F (36.7 C)] 97.8 F (36.6 C) (01/19 0319) Pulse Rate:  [72-73] 72 (01/19 0319) Resp:  [16-20] 20 (01/19 0319) BP: (95-107)/(44-50) 95/45 (01/19 0319) SpO2:  [97 %-99 %] 99 % (01/19 0315) Weight:  [126.8 kg] 126.8 kg (01/19 0315) Last BM Date: 09/02/20  Weight change: Filed Weights   09/01/20 0533 09/02/20 0441 09/03/20 0315  Weight: 131.4 kg 129.5 kg 126.8 kg    Intake/Output:   Intake/Output Summary (Last 24 hours) at 09/03/2020 1107 Last data filed at 09/03/2020 1052 Gross per 24 hour  Intake 1320 ml  Output 2850 ml  Net -1530 ml      Physical Exam    General:   No resp difficulty HEENT: Normal Neck: Supple. JVP 7-8 . Carotids 2+ bilat; no bruits. No lymphadenopathy or thyromegaly appreciated. Cor: PMI nondisplaced. Irregular rate & rhythm. No rubs, gallops or murmurs. Lungs: Clear Abdomen: Soft, nontender, nondistended. No hepatosplenomegaly. No bruits or masses. Good bowel sounds. Extremities: No cyanosis, clubbing, rash, R and LLE 1 + edema Neuro: Alert & orientedx3, cranial nerves grossly intact. moves all 4 extremities w/o difficulty. Affect pleasant   Telemetry   A fib 70-80s   EKG    N/a   Labs    CBC Recent Labs    09/02/20 0440 09/03/20 0447  WBC 4.9 7.5  HGB 9.1* 8.8*  HCT 29.2* 29.3*  MCV 93.6 93.9  PLT 154 623*   Basic Metabolic Panel Recent Labs    09/02/20 0440 09/03/20 0447  NA 140 140  K 4.5 3.9  CL 113* 114*  CO2 18* 17*  GLUCOSE 105* 98  BUN 22 20  CREATININE 1.75* 1.98*  CALCIUM 8.7* 8.3*  MG  --  1.5*   Liver Function Tests No results for  input(s): AST, ALT, ALKPHOS, BILITOT, PROT, ALBUMIN in the last 72 hours. No results for input(s): LIPASE, AMYLASE in the last 72 hours. Cardiac Enzymes No results for input(s): CKTOTAL, CKMB, CKMBINDEX, TROPONINI in the last 72 hours.  BNP: BNP (last 3 results) Recent Labs    04/22/20 1451  BNP 151.1*    ProBNP (last 3 results) Recent Labs    06/03/20 1520  PROBNP 948*     D-Dimer No results for input(s): DDIMER in the last 72 hours. Hemoglobin A1C No results for input(s): HGBA1C in the last 72 hours. Fasting Lipid Panel No results for input(s): CHOL, HDL, LDLCALC, TRIG, CHOLHDL, LDLDIRECT in the last 72 hours. Thyroid Function Tests No results for input(s): TSH, T4TOTAL, T3FREE, THYROIDAB in the last 72 hours.  Invalid input(s): FREET3  Other results:   Imaging     No results found.   Medications:     Scheduled Medications: . apixaban  5 mg Oral BID  . buPROPion  300 mg Oral Daily  . digoxin  0.125 mg Oral Daily  . escitalopram  10 mg Oral Daily  . metoprolol tartrate  25 mg Oral BID  . mirabegron ER  50 mg Oral Daily  . tamsulosin  0.8 mg Oral QHS     Infusions: .  sodium chloride 500 mL (09/02/20 1102)  . piperacillin-tazobactam (ZOSYN)  IV 3.375 g (09/03/20 0909)     PRN Medications:  sodium chloride, acetaminophen **OR** acetaminophen, Gerhardt's butt cream, ipratropium-albuterol, ondansetron **OR** ondansetron (ZOFRAN) IV, polyethylene glycol     Assessment/Plan   1. Urosepsis, lactic acidosis.  -Lactic acid 3.4>1.4 . Given fluid resuscitation.  -Urine culture pending -On zosyn.   2. A/C Diastolic Heart Failure  Echo EF 60 to 65% with severe LVH. The septum was 2.5 cm in the posterior wall 2.1 cm.RV ok - PYP 10/21 negative  -Yesterday diuresed with IV lasix with good response. Creatinine trending up.  - Need RHC to further assess. Will set up for tomorrow.  -Plan to keep off jardiance with UTI.   4. AKI  - Creatinine  baseline 1.2-1.3  -Creatinine on admit 1.4  -Todays creatinine trending up 1.75>1.98   - Check BMET   5. Permanent A fib  -permanent AF s/p AVN ablation in 5/20 (unable to get CHB) and CRT, -Rate controlled. On metoprolol + dig. BP has been soft. May need to cut back metoprolol.  - Has been on dig long term per EP. Dig level 0.5 .  -Continue eliquis 5 mg twice a day.   6. OSA  -Uses CPAP   7. Restrictive lung disease -PFTs 5/21: FEV1 1.66 (59%) FVC 2.28 (59%) DLCO 69%  8. Morbid Obesity  -s/p gastric bypass Body mass index is 40.95 kg/m.  RHC tomorrow. NPO after midnight.   Length of Stay: 3  Amy Clegg, NP  09/03/2020, 11:07 AM  Advanced Heart Failure Team Pager [phone] (M-F; 7a - 4p)  Please contact [facility] for night-coverage after hours (4p -7a ) and weekends on [email or web address]  Patient seen and examined with the above-signed Advanced Practice Provider and/or Housestaff. I personally reviewed laboratory data, imaging studies and relevant notes. I independently examined the patient and formulated the important aspects of the plan. I have edited the note to reflect any of my changes or salient points. I have personally discussed the plan with the patient and/or family.  Is diuresing well but creatinine starting to bump. Feels more SOB.   General: Sitting up in bed  No resp difficulty HEENT: normal Neck: supple. JVP hard to see. Appears up. . Carotids 2+ bilat; no bruits. No lymphadenopathy or thryomegaly appreciated. Cor: PMI nondisplaced. Regular rate & rhythm. No rubs, gallops or murmurs. Lungs: clear Abdomen: soft, nontender, nondistended. No hepatosplenomegaly. No bruits or masses. Good bowel sounds. Extremities: no cyanosis, clubbing, rash, 2+ edema Neuro: alert & orientedx3, cranial nerves grossly intact. moves all 4 extremities w/o difficulty. Affect pleasant  Volume status appears to be still elevated but has significant LE edema and worsening SOB. Will  need RHC to further assess volume status.   Glori Bickers, MD  12:39 PM

## 2020-09-03 NOTE — Progress Notes (Signed)
[facility]                                                                              Patient Demographics  Eric Mayer, is a 80 y.o. male, DOB - October 14, 1940, OX:[id number]  Admit date - 08/30/2020   Admitting Physician No admitting provider for patient encounter.  Outpatient Primary MD for the patient is [name], MD  Outpatient specialists:   LOS - 3  days   Medical records reviewed and are as summarized below:    Chief Complaint  Patient presents with  . Chest Pain       Brief summary   Patient was admitted 08/31/2019 with concerns for urosepsis but presented because of transient chest pain. 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 Abnormal lactic acid, hypotension, mild which actually had been going on for some time, abnormal urinalysis.  Treated with ceftriaxone changed to Zosyn, haziness around the gallbladder on CT ultrasound checked.  Also acute on chronic kidney injury.  Mild.  Troponins elevated not thought to be from MI.  1/17: BP has been soft, decrease metoprolol and started on Lasix 20 mg IV secondary to fluid overload 1/18: CHF/cardiology consulted    Assessment & Plan    Principal Problem: Sepsis secondary to UTI, POA -Presented with hematuria and dysuria, lactic acidosis, hypotension, AKI -urine culture apparently not sent from ED, UA was positive -No prior urine cultures to compare with.  Urine culture from 1/17 negative, blood  cultures negative so far -CT abdomen with concern of possible cholecystitis however abdominal ultrasound negative for acute cholecystitis. -Procalcitonin improving 0.74 <-1.1.  Continue IV Zosyn -Discontinue Jardiance with UTIs  Active Problems: Hematuria in the setting of anticoagulation and UTI -Per patient hematuria improving, getting clear now -H&H stable  Lactic acidosis Resolved  Acute on chronic diastolic CHF -2D echo 0000000 had shown EF of 60 to 65% -Follows Dr. Haroldine Laws outpatient, diuretics, Delene Loll had been held secondary to hypotension -Now 2+ pitting edema, dyspnea on exertion, orthopnea secondary to fluid overload.  Difficult to do aggressive IV diuresis due to soft BP.  Decrease beta-blocker to 25 mg twice daily, -Cardiology following, agree with decreasing beta-blocker and patient received IV Lasix diuresis, weight down from 289lbs on admission to 279 today -Continue strict I's and O's and daily weights, follow recommendation regarding adjustment of meds and Lasix   Hypertension BP still in low 90s, metoprolol decreased to 25 mg twice daily  (at home on 75 mg twice daily)   Mild acute on CKD stage IIIa -Baseline creatinine 1.2-1.3, presented with creatinine of 1.9 -Creatinine worsened to 1.9 today, follow with diuresis   Persistent atrial fibrillation -Heart rate currently controlled, continue digoxin, beta-blocker -Continue eliquis  Obesity Estimated body mass index is 40.12 kg/m as calculated from the following:   Height as of this encounter: 5'  10" (1.778 m).   Weight as of this encounter: 126.8 kg.  Code Status: Full CODE STATUS DVT Prophylaxis: Eliquis Family Communication: Discussed all imaging results, lab results, explained to the patient.  Offered to call his wife, patient stated that he will discuss the plan with her.  Disposition Plan:     Status is: Inpatient  Remains inpatient appropriate because:Inpatient level of care appropriate  due to severity of illness   Dispo: The patient is from: Home              Anticipated d/c is to: Home              Anticipated d/c date is: 2 days              Patient currently is not medically stable to d/c.  Has significant fluid overload, cardiology consulted    Time Spent in minutes   35 minutes  Procedures:  None  Consultants:   Cardiology  Antimicrobials:   Anti-infectives (From admission, onward)   Start     Dose/Rate Route Frequency Ordered Stop   08/31/20 0415  piperacillin-tazobactam (ZOSYN) IVPB 3.375 g       "Followed by" Linked Group Details   3.375 g 12.5 mL/hr over 240 Minutes Intravenous Every 8 hours 08/30/20 2145     08/30/20 2145  piperacillin-tazobactam (ZOSYN) IVPB 3.375 g       "Followed by" Linked Group Details   3.375 g 100 mL/hr over 30 Minutes Intravenous  Once 08/30/20 2145 08/30/20 2336   08/30/20 1600  cefTRIAXone (ROCEPHIN) 1 g in sodium chloride 0.9 % 100 mL IVPB  Status:  Discontinued        1 g 200 mL/hr over 30 Minutes Intravenous Every 24 hours 08/30/20 1556 08/30/20 2145         Medications  Scheduled Meds: . apixaban  5 mg Oral BID  . buPROPion  300 mg Oral Daily  . digoxin  0.125 mg Oral Daily  . escitalopram  10 mg Oral Daily  . metoprolol tartrate  25 mg Oral BID  . mirabegron ER  50 mg Oral Daily  . tamsulosin  0.8 mg Oral QHS   Continuous Infusions: . sodium chloride 500 mL (09/02/20 1102)  . piperacillin-tazobactam (ZOSYN)  IV 3.375 g (09/03/20 0909)   PRN Meds:.sodium chloride, acetaminophen **OR** acetaminophen, Gerhardt's butt cream, ipratropium-albuterol, ondansetron **OR** ondansetron (ZOFRAN) IV, polyethylene glycol      Subjective:   Eric Mayer was seen and examined today.  States still feels winded on walking to the bathroom and back.  Hematuria has improved, swelling in the legs is improving.  BP remains soft in 90s, no fevers or chills, chest pain.   Objective:   Vitals:   09/02/20 2150  09/03/20 0005 09/03/20 0315 09/03/20 0319  BP: (!) 104/46 (!) 95/44 (!) 95/45 (!) 95/45  Pulse: 73 72 72 72  Resp: 18 16 20 20   Temp: 98 F (36.7 C) 98 F (36.7 C) 97.8 F (36.6 C) 97.8 F (36.6 C)  TempSrc: Oral Oral Oral   SpO2: 98% 98% 99%   Weight:   126.8 kg   Height:        Intake/Output Summary (Last 24 hours) at 09/03/2020 1049 Last data filed at 09/03/2020 0317 Gross per 24 hour  Intake 1200 ml  Output 2600 ml  Net -1400 ml     Wt Readings from Last 3 Encounters:  09/03/20 126.8 kg  07/14/20 127.3 kg  06/09/20 126.6 kg  Physical Exam  General: Alert and oriented x 3, NAD  Cardiovascular: S1 S2 clear, RRR.  2+ pedal edema b/l  Respiratory: Diminished breath sound at the bases, no wheezing  Gastrointestinal: Soft, nontender, nondistended, NBS  Ext: 2+ pedal edema bilaterally  Neuro: no new deficits  Musculoskeletal: No cyanosis, clubbing  Skin: No rashes  Psych: Normal affect and demeanor, alert and oriented x3       Data Reviewed:  I have personally reviewed following labs and imaging studies  Micro Results Recent Results (from the past 240 hour(s))  Culture, blood (Routine x 2)     Status: None (Preliminary result)   Collection Time: 08/30/20  2:35 PM   Specimen: BLOOD RIGHT ARM  Result Value Ref Range Status   Specimen Description BLOOD RIGHT ARM  Final   Special Requests   Final    BOTTLES DRAWN AEROBIC AND ANAEROBIC Blood Culture results may not be optimal due to an excessive volume of blood received in culture bottles   Culture   Final    NO GROWTH 4 DAYS Performed at [facility], [address], [place], [place] [address]    Report Status PENDING  Incomplete  Culture, blood (Routine x 2)     Status: None (Preliminary result)   Collection Time: 08/30/20  5:08 PM   Specimen: BLOOD RIGHT HAND  Result Value Ref Range Status   Specimen Description BLOOD RIGHT HAND  Final   Special Requests   Final    BOTTLES DRAWN AEROBIC AND  ANAEROBIC Blood Culture results may not be optimal due to an inadequate volume of blood received in culture bottles   Culture   Final    NO GROWTH 4 DAYS Performed at [facility], [place] [address] [place], [place] [address]    Report Status PENDING  Incomplete  Resp Panel by RT-PCR (Flu A&B, Covid) Nasopharyngeal Swab     Status: None   Collection Time: 08/30/20  5:22 PM   Specimen: Nasopharyngeal Swab; Nasopharyngeal(NP) swabs in vial transport medium  Result Value Ref Range Status   SARS Coronavirus 2 by RT PCR NEGATIVE NEGATIVE Final    Comment: (NOTE) SARS-CoV-2 target nucleic acids are NOT DETECTED.  The SARS-CoV-2 RNA is generally detectable in upper respiratory specimens during the acute phase of infection. The lowest concentration of SARS-CoV-2 viral copies this assay can detect is 138 copies/mL. A negative result does not preclude SARS-Cov-2 infection and should not be used as the sole basis for treatment or other patient management decisions. A negative result may occur with  improper specimen collection/handling, submission of specimen other than nasopharyngeal swab, presence of viral mutation(s) within the areas targeted by this assay, and inadequate number of viral copies(<138 copies/mL). A negative result must be combined with clinical observations, patient history, and epidemiological information. The expected result is Negative.  Fact Sheet for Patients:  EntrepreneurPulse.com.au  Fact Sheet for Healthcare Providers:  IncredibleEmployment.be  This test is no t yet approved or cleared by the [place] FDA and  has been authorized for detection and/or diagnosis of SARS-CoV-2 by FDA under an Emergency Use Authorization (EUA). This EUA will remain  in effect (meaning this test can be used) for the duration of the COVID-19 declaration under Section 564(b)(1) of the Act, 21 U.S.C.section 360bbb-3(b)(1), unless the  authorization is terminated  or revoked sooner.       Influenza A by PCR NEGATIVE NEGATIVE Final   Influenza B by PCR NEGATIVE NEGATIVE Final  Comment: (NOTE) The Xpert Xpress SARS-CoV-2/FLU/RSV plus assay is intended as an aid in the diagnosis of influenza from Nasopharyngeal swab specimens and should not be used as a sole basis for treatment. Nasal washings and aspirates are unacceptable for Xpert Xpress SARS-CoV-2/FLU/RSV testing.  Fact Sheet for Patients: BloggerCourse.com  Fact Sheet for Healthcare Providers: SeriousBroker.it  This test is not yet approved or cleared by the [place] FDA and has been authorized for detection and/or diagnosis of SARS-CoV-2 by FDA under an Emergency Use Authorization (EUA). This EUA will remain in effect (meaning this test can be used) for the duration of the COVID-19 declaration under Section 564(b)(1) of the Act, 21 U.S.C. section 360bbb-3(b)(1), unless the authorization is terminated or revoked.  Performed at [facility], [address] [place], [place] [address]   Culture, Urine     Status: None   Collection Time: 09/01/20 10:43 AM   Specimen: Urine, Clean Catch  Result Value Ref Range Status   Specimen Description URINE, CLEAN CATCH  Final   Special Requests Zosyn  Final   Culture   Final    NO GROWTH Performed at [facility], [address] [place], [place] [address]    Report Status 09/02/2020 FINAL  Final    Radiology Reports CT ABDOMEN PELVIS WO CONTRAST  Result Date: 08/31/2020 CLINICAL DATA:  Sepsis due to a grand negative UTI. EXAM: CT ABDOMEN AND PELVIS WITHOUT CONTRAST TECHNIQUE: Multidetector CT imaging of the abdomen and pelvis was performed following the standard protocol without IV contrast. COMPARISON:  October 21, 2017 FINDINGS: Lower chest: The lung bases are clear. The heart size is normal. Hepatobiliary: The liver is normal. There is  cholelithiasis. There is mild haziness about the gallbladder.There is no biliary ductal dilation. Pancreas: Normal contours without ductal dilatation. No peripancreatic fluid collection. Spleen: Unremarkable. Adrenals/Urinary Tract: --Adrenal glands: Unremarkable. --Right kidney/ureter: There is a punctate nonobstructing stone. --Left kidney/ureter: No hydronephrosis or radiopaque kidney stones. --Urinary bladder: Unremarkable. Stomach/Bowel: --Stomach/Duodenum: No hiatal hernia or other gastric abnormality. Normal duodenal course and caliber. --Small bowel: Unremarkable. --Colon: There is an above average amount of stool in the colon. There is liquid stool in the transverse colon. --Appendix: Normal. Vascular/Lymphatic: Atherosclerotic calcification is present within the non-aneurysmal abdominal aorta, without hemodynamically significant stenosis. --No retroperitoneal lymphadenopathy. --No mesenteric lymphadenopathy. --No pelvic or inguinal lymphadenopathy. Reproductive: Brachytherapy beads are noted in the prostate gland. Other: There are fat containing bilateral inguinal hernias. There are stable postsurgical changes in the left inguinal region. Musculoskeletal. No acute displaced fractures. IMPRESSION: 1. Cholelithiasis with mild haziness about the gallbladder. If there is concern for acute cholecystitis, recommend further evaluation with right upper quadrant ultrasound. 2. Nonobstructive right nephrolithiasis. 3. Large stool burden. Liquid stool in the transverse colon consistent with a possible underlying diarrheal illness. 4. Additional chronic findings as detailed above. Aortic Atherosclerosis (ICD10-I70.0). Electronically Signed   By: Katherine Mantle M.D.   On: 08/31/2020 00:06   DG Chest 2 View  Result Date: 08/30/2020 CLINICAL DATA:  Chest pain and dyspnea EXAM: CHEST - 2 VIEW COMPARISON:  02/21/2020 chest radiograph. FINDINGS: Stable configuration of 2 lead left subclavian pacemaker. Stable  cardiomediastinal silhouette with mild cardiomegaly. No pneumothorax. No pleural effusion. Lungs appear clear, with no acute consolidative airspace disease and no pulmonary edema. IMPRESSION: Stable mild cardiomegaly without pulmonary edema. No active pulmonary disease. Electronically Signed   By: Delbert Phenix M.D.   On: 08/30/2020 15:00   CUP PACEART REMOTE DEVICE CHECK  Result Date: 08/21/2020 Scheduled remote reviewed. Normal device function.  Known AF, on [place], programmed DDI.  There was one NSVT arrhythmia detected that was greater than 10 beats, sent to triage. Next remote 91 days. Kathy Breach, RN, CCDS, [employer]  US Abdomen Limited RUQ (LIVER/GB)  Result Date: 08/31/2020 CLINICAL DATA:  Cholecystitis EXAM: ULTRASOUND ABDOMEN LIMITED RIGHT UPPER QUADRANT COMPARISON:  08/30/2020 CT abdomen/pelvis. FINDINGS: Gallbladder: A few layering shadowing gallstones in the gallbladder, largest 1.2 cm. No definite gallbladder wall thickening (gallbladder wall thickness 2.6 mm). No sonographic Murphy's sign. No pericholecystic fluid. Common bile duct: Diameter: 3 mm Liver: Diffusely echogenic liver parenchyma. No definite liver surface irregularity. No liver mass, noting decreased sensitivity in the setting of an echogenic liver. Portal vein is patent on color Doppler imaging with normal direction of blood flow towards the liver. Other: None. IMPRESSION: 1. Cholelithiasis.  No sonographic evidence of acute cholecystitis. 2. No biliary ductal dilatation. 3. Echogenic liver, a nonspecific finding that can be due to hepatic steatosis and/or fibrosis. Recommend correlation with liver function tests. Consider outpatient hepatic elastography for further liver fibrosis risk stratification, as clinically warranted. Electronically Signed   By: Ilona Sorrel M.D.   On: 08/31/2020 09:25    Lab Data:  CBC: Recent Labs  Lab 08/30/20 1434 08/31/20 0255 09/02/20 0440 09/03/20 0447  WBC 14.4* 6.1 4.9 7.5   NEUTROABS  --  4.7  --   --   HGB 10.9* 8.8* 9.1* 8.8*  HCT 36.7* 29.6* 29.2* 29.3*  MCV 94.3 94.9 93.6 93.9  PLT 189 127* 154 [id number]*   Basic Metabolic Panel: Recent Labs  Lab 08/30/20 2231 08/31/20 0255 09/01/20 0645 09/02/20 0440 09/03/20 0447  NA 135 140 138 140 140  K 5.3* 5.2* 4.8 4.5 3.9  CL 112* 114* 111 113* 114*  CO2 16* 19* 18* 18* 17*  GLUCOSE 98 113* 108* 105* 98  BUN 28* 30* 25* 22 20  CREATININE 1.60* 1.92* 1.68* 1.75* 1.98*  CALCIUM 8.0* 8.2* 8.7* 8.7* 8.3*  MG  --  1.7  --   --  1.5*  PHOS 2.7  --   --   --   --    GFR: Estimated Creatinine Clearance: 41.8 mL/min (A) (by C-G formula based on SCr of 1.98 mg/dL (H)). Liver Function Tests: Recent Labs  Lab 08/30/20 1434 08/30/20 2231 08/31/20 0255  AST 23  --  23  ALT 36  --  36  ALKPHOS 123  --  97  BILITOT 0.8  --  0.8  PROT 5.8*  --  4.8*  ALBUMIN 3.2* 2.7* 2.6*   No results for input(s): LIPASE, AMYLASE in the last 168 hours. No results for input(s): AMMONIA in the last 168 hours. Coagulation Profile: Recent Labs  Lab 08/30/20 1425 08/31/20 0255  INR 1.3* 1.7*   Cardiac Enzymes: No results for input(s): CKTOTAL, CKMB, CKMBINDEX, TROPONINI in the last 168 hours. BNP (last 3 results) Recent Labs    06/03/20 1520  PROBNP 948*   HbA1C: No results for input(s): HGBA1C in the last 72 hours. CBG: No results for input(s): GLUCAP in the last 168 hours. Lipid Profile: No results for input(s): CHOL, HDL, LDLCALC, TRIG, CHOLHDL, LDLDIRECT in the last 72 hours. Thyroid Function Tests: No results for input(s): TSH, T4TOTAL, FREET4, T3FREE, THYROIDAB in the last 72 hours. Anemia Panel: No results for input(s): VITAMINB12, FOLATE, FERRITIN, TIBC, IRON, RETICCTPCT in the last 72 hours. Urine analysis:    Component Value Date/Time  COLORURINE YELLOW 08/30/2020 [place] 08/30/2020 1820   LABSPEC 1.025 08/30/2020 1820   PHURINE 5.5 08/30/2020 1820   GLUCOSEU NEGATIVE 08/30/2020  1820   HGBUR MODERATE (A) 08/30/2020 1820   BILIRUBINUR NEGATIVE 08/30/2020 1820   KETONESUR NEGATIVE 08/30/2020 1820   PROTEINUR 30 (A) 08/30/2020 1820   UROBILINOGEN 0.2 12/05/2014 1052   NITRITE POSITIVE (A) 08/30/2020 1820   LEUKOCYTESUR MODERATE (A) 08/30/2020 1820     Paulo Keimig M.D. [facility] 09/03/2020, 10:49 AM   Call night coverage person covering after 7pm

## 2020-09-03 NOTE — H&P (View-Only) (Signed)
Advanced Heart Failure Rounding Note  PCP-Cardiologist: [name], MD   Subjective:    Yesterday diuresed with IV lasix. Brisk diuresis noted. Weight down another 6 pounds.   Feels ok but he wasn't able to walk as far. SOB with exertion.   Objective:   Weight Range: 126.8 kg Body mass index is 40.12 kg/m.   Vital Signs:   Temp:  [97.8 F (36.6 C)-98 F (36.7 C)] 97.8 F (36.6 C) (01/19 0319) Pulse Rate:  [72-73] 72 (01/19 0319) Resp:  [16-20] 20 (01/19 0319) BP: (95-107)/(44-50) 95/45 (01/19 0319) SpO2:  [97 %-99 %] 99 % (01/19 0315) Weight:  [126.8 kg] 126.8 kg (01/19 0315) Last BM Date: 09/02/20  Weight change: Filed Weights   09/01/20 0533 09/02/20 0441 09/03/20 0315  Weight: 131.4 kg 129.5 kg 126.8 kg    Intake/Output:   Intake/Output Summary (Last 24 hours) at 09/03/2020 1107 Last data filed at 09/03/2020 1052 Gross per 24 hour  Intake 1320 ml  Output 2850 ml  Net -1530 ml      Physical Exam    General:   No resp difficulty HEENT: Normal Neck: Supple. JVP 7-8 . Carotids 2+ bilat; no bruits. No lymphadenopathy or thyromegaly appreciated. Cor: PMI nondisplaced. Irregular rate & rhythm. No rubs, gallops or murmurs. Lungs: Clear Abdomen: Soft, nontender, nondistended. No hepatosplenomegaly. No bruits or masses. Good bowel sounds. Extremities: No cyanosis, clubbing, rash, R and LLE 1 + edema Neuro: Alert & orientedx3, cranial nerves grossly intact. moves all 4 extremities w/o difficulty. Affect pleasant   Telemetry   A fib 70-80s   EKG    N/a   Labs    CBC Recent Labs    09/02/20 0440 09/03/20 0447  WBC 4.9 7.5  HGB 9.1* 8.8*  HCT 29.2* 29.3*  MCV 93.6 93.9  PLT 154 623*   Basic Metabolic Panel Recent Labs    09/02/20 0440 09/03/20 0447  NA 140 140  K 4.5 3.9  CL 113* 114*  CO2 18* 17*  GLUCOSE 105* 98  BUN 22 20  CREATININE 1.75* 1.98*  CALCIUM 8.7* 8.3*  MG  --  1.5*   Liver Function Tests No results for  input(s): AST, ALT, ALKPHOS, BILITOT, PROT, ALBUMIN in the last 72 hours. No results for input(s): LIPASE, AMYLASE in the last 72 hours. Cardiac Enzymes No results for input(s): CKTOTAL, CKMB, CKMBINDEX, TROPONINI in the last 72 hours.  BNP: BNP (last 3 results) Recent Labs    04/22/20 1451  BNP 151.1*    ProBNP (last 3 results) Recent Labs    06/03/20 1520  PROBNP 948*     D-Dimer No results for input(s): DDIMER in the last 72 hours. Hemoglobin A1C No results for input(s): HGBA1C in the last 72 hours. Fasting Lipid Panel No results for input(s): CHOL, HDL, LDLCALC, TRIG, CHOLHDL, LDLDIRECT in the last 72 hours. Thyroid Function Tests No results for input(s): TSH, T4TOTAL, T3FREE, THYROIDAB in the last 72 hours.  Invalid input(s): FREET3  Other results:   Imaging     No results found.   Medications:     Scheduled Medications: . apixaban  5 mg Oral BID  . buPROPion  300 mg Oral Daily  . digoxin  0.125 mg Oral Daily  . escitalopram  10 mg Oral Daily  . metoprolol tartrate  25 mg Oral BID  . mirabegron ER  50 mg Oral Daily  . tamsulosin  0.8 mg Oral QHS     Infusions: .  sodium chloride 500 mL (09/02/20 1102)  . piperacillin-tazobactam (ZOSYN)  IV 3.375 g (09/03/20 0909)     PRN Medications:  sodium chloride, acetaminophen **OR** acetaminophen, Gerhardt's butt cream, ipratropium-albuterol, ondansetron **OR** ondansetron (ZOFRAN) IV, polyethylene glycol     Assessment/Plan   1. Urosepsis, lactic acidosis.  -Lactic acid 3.4>1.4 . Given fluid resuscitation.  -Urine culture pending -On zosyn.   2. A/C Diastolic Heart Failure  Echo EF 60 to 65% with severe LVH. The septum was 2.5 cm in the posterior wall 2.1 cm.RV ok - PYP 10/21 negative  -Yesterday diuresed with IV lasix with good response. Creatinine trending up.  - Need RHC to further assess. Will set up for tomorrow.  -Plan to keep off jardiance with UTI.   4. AKI  - Creatinine  baseline 1.2-1.3  -Creatinine on admit 1.4  -Todays creatinine trending up 1.75>1.98   - Check BMET   5. Permanent A fib  -permanent AF s/p AVN ablation in 5/20 (unable to get CHB) and CRT, -Rate controlled. On metoprolol + dig. BP has been soft. May need to cut back metoprolol.  - Has been on dig long term per EP. Dig level 0.5 .  -Continue eliquis 5 mg twice a day.   6. OSA  -Uses CPAP   7. Restrictive lung disease -PFTs 5/21: FEV1 1.66 (59%) FVC 2.28 (59%) DLCO 69%  8. Morbid Obesity  -s/p gastric bypass Body mass index is 40.95 kg/m.  RHC tomorrow. NPO after midnight.   Length of Stay: 3  Amy Clegg, NP  09/03/2020, 11:07 AM  Advanced Heart Failure Team Pager [phone] (M-F; 7a - 4p)  Please contact [facility] for night-coverage after hours (4p -7a ) and weekends on [email or web address]  Patient seen and examined with the above-signed Advanced Practice Provider and/or Housestaff. I personally reviewed laboratory data, imaging studies and relevant notes. I independently examined the patient and formulated the important aspects of the plan. I have edited the note to reflect any of my changes or salient points. I have personally discussed the plan with the patient and/or family.  Is diuresing well but creatinine starting to bump. Feels more SOB.   General: Sitting up in bed  No resp difficulty HEENT: normal Neck: supple. JVP hard to see. Appears up. . Carotids 2+ bilat; no bruits. No lymphadenopathy or thryomegaly appreciated. Cor: PMI nondisplaced. Regular rate & rhythm. No rubs, gallops or murmurs. Lungs: clear Abdomen: soft, nontender, nondistended. No hepatosplenomegaly. No bruits or masses. Good bowel sounds. Extremities: no cyanosis, clubbing, rash, 2+ edema Neuro: alert & orientedx3, cranial nerves grossly intact. moves all 4 extremities w/o difficulty. Affect pleasant  Volume status appears to be still elevated but has significant LE edema and worsening SOB. Will  need RHC to further assess volume status.   Jeremian Whitby, MD  12:39 PM    

## 2020-09-03 NOTE — Progress Notes (Signed)
  Mobility Specialist Criteria Algorithm Info.  Mobility Team: HOB elevated: Activity: Ambulated in hall Range of motion: Active; All extremities Level of assistance: Standby assist, set-up cues, supervision of patient - no hands on (Min A (LE's) sit<supine) Assistive device: Front wheel walker Minutes sitting in chair:  Minutes stood: 5 minutes Minutes ambulated: 5 minutes Distance ambulated (ft): 80 ft Mobility response: Tolerated well (SOB + Back pain) Bed Position: Semi-fowlers  Patient agreed to participate in mobility. Stood and ambulated in hallway 80 feet (40 +40) w/RW at supervision requiring a standing rest break x1. Distance limited secondary to fatigue and exertional dyspnea. Had no complaints other than lower back pain which he described is chronic and ongoing. SOB subsided after approximately 2 minutes of seated rest once he returned to room. Tolerated mobility well but required min A to get LLE in bed sit<supine otherwise patient can do for himself. He is now lying in bed with all needs met and call bell in reach.    09/03/2020 11:49 AM

## 2020-09-04 ENCOUNTER — Encounter (HOSPITAL_COMMUNITY)
Admission: EM | Disposition: A | Discharge status: Home / Self Care | Payer: Self-pay | Source: Home / Self Care | Attending: Internal Medicine

## 2020-09-04 ENCOUNTER — Encounter (HOSPITAL_COMMUNITY): Payer: Self-pay | Admitting: Internal Medicine

## 2020-09-04 ENCOUNTER — Encounter: Payer: Medicare Other | Admitting: Internal Medicine

## 2020-09-04 ENCOUNTER — Other Ambulatory Visit: Payer: Self-pay

## 2020-09-04 DIAGNOSIS — I5032 Chronic diastolic (congestive) heart failure: Secondary | ICD-10-CM | POA: Diagnosis not present

## 2020-09-04 DIAGNOSIS — N1831 Chronic kidney disease, stage 3a: Secondary | ICD-10-CM | POA: Diagnosis not present

## 2020-09-04 DIAGNOSIS — K819 Cholecystitis, unspecified: Secondary | ICD-10-CM | POA: Diagnosis not present

## 2020-09-04 HISTORY — PX: RIGHT HEART CATH: CATH118263

## 2020-09-04 LAB — POCT I-STAT EG7
Acid-base deficit: 8 mmol/L — ABNORMAL HIGH (ref 0.0–2.0)
Acid-base deficit: 8 mmol/L — ABNORMAL HIGH (ref 0.0–2.0)
Acid-base deficit: 9 mmol/L — ABNORMAL HIGH (ref 0.0–2.0)
Bicarbonate: 16.3 mmol/L — ABNORMAL LOW (ref 20.0–28.0)
Bicarbonate: 17.7 mmol/L — ABNORMAL LOW (ref 20.0–28.0)
Bicarbonate: 17.9 mmol/L — ABNORMAL LOW (ref 20.0–28.0)
Calcium, Ion: 1.05 mmol/L — ABNORMAL LOW (ref 1.15–1.40)
Calcium, Ion: 1.22 mmol/L (ref 1.15–1.40)
Calcium, Ion: 1.27 mmol/L (ref 1.15–1.40)
HCT: 24 % — ABNORMAL LOW (ref 39.0–52.0)
HCT: 27 % — ABNORMAL LOW (ref 39.0–52.0)
HCT: 27 % — ABNORMAL LOW (ref 39.0–52.0)
Hemoglobin: 8.2 g/dL — ABNORMAL LOW (ref 13.0–17.0)
Hemoglobin: 9.2 g/dL — ABNORMAL LOW (ref 13.0–17.0)
Hemoglobin: 9.2 g/dL — ABNORMAL LOW (ref 13.0–17.0)
O2 Saturation: 61 %
O2 Saturation: 61 %
O2 Saturation: 62 %
Potassium: 3.1 mmol/L — ABNORMAL LOW (ref 3.5–5.1)
Potassium: 3.4 mmol/L — ABNORMAL LOW (ref 3.5–5.1)
Potassium: 3.4 mmol/L — ABNORMAL LOW (ref 3.5–5.1)
Sodium: 144 mmol/L (ref 135–145)
Sodium: 145 mmol/L (ref 135–145)
Sodium: 148 mmol/L — ABNORMAL HIGH (ref 135–145)
TCO2: 17 mmol/L — ABNORMAL LOW (ref 22–32)
TCO2: 19 mmol/L — ABNORMAL LOW (ref 22–32)
TCO2: 19 mmol/L — ABNORMAL LOW (ref 22–32)
pCO2, Ven: 31.7 mmHg — ABNORMAL LOW (ref 44.0–60.0)
pCO2, Ven: 34.9 mmHg — ABNORMAL LOW (ref 44.0–60.0)
pCO2, Ven: 36.7 mmHg — ABNORMAL LOW (ref 44.0–60.0)
pH, Ven: 7.296 (ref 7.250–7.430)
pH, Ven: 7.314 (ref 7.250–7.430)
pH, Ven: 7.318 (ref 7.250–7.430)
pO2, Ven: 34 mmHg (ref 32.0–45.0)
pO2, Ven: 34 mmHg (ref 32.0–45.0)
pO2, Ven: 35 mmHg (ref 32.0–45.0)

## 2020-09-04 LAB — CBC
HCT: 29.7 % — ABNORMAL LOW (ref 39.0–52.0)
Hemoglobin: 8.9 g/dL — ABNORMAL LOW (ref 13.0–17.0)
MCH: 28.2 pg (ref 26.0–34.0)
MCHC: 30 g/dL (ref 30.0–36.0)
MCV: 94 fL (ref 80.0–100.0)
Platelets: 159 10*3/uL (ref 150–400)
RBC: 3.16 MIL/uL — ABNORMAL LOW (ref 4.22–5.81)
RDW: 16.1 % — ABNORMAL HIGH (ref 11.5–15.5)
WBC: 8.1 10*3/uL (ref 4.0–10.5)
nRBC: 0 % (ref 0.0–0.2)

## 2020-09-04 LAB — CULTURE, BLOOD (ROUTINE X 2)
Culture: NO GROWTH
Culture: NO GROWTH

## 2020-09-04 LAB — MAGNESIUM: Magnesium: 1.9 mg/dL (ref 1.7–2.4)

## 2020-09-04 SURGERY — RIGHT HEART CATH
Anesthesia: LOCAL

## 2020-09-04 MED ORDER — LABETALOL HCL 5 MG/ML IV SOLN: 10.0000 mg | INTRAVENOUS | Status: AC | PRN

## 2020-09-04 MED ORDER — LIDOCAINE HCL (PF) 1 % IJ SOLN
INTRAMUSCULAR | Status: DC | PRN
  Administered 2020-09-04: 4 mL

## 2020-09-04 MED ORDER — SODIUM CHLORIDE 0.9% FLUSH: 3.0000 mL | INTRAVENOUS | Status: DC | PRN

## 2020-09-04 MED ORDER — WITCH HAZEL-GLYCERIN EX PADS
MEDICATED_PAD | CUTANEOUS | Status: DC | PRN
  Filled 2020-09-04: qty 100

## 2020-09-04 MED ORDER — SODIUM CHLORIDE 0.9 % IV SOLN: 250.0000 mL | INTRAVENOUS | Status: DC | PRN

## 2020-09-04 MED ORDER — HEPARIN (PORCINE) IN NACL 1000-0.9 UT/500ML-% IV SOLN
INTRAVENOUS | Status: DC | PRN
  Administered 2020-09-04: 500 mL

## 2020-09-04 MED ORDER — ONDANSETRON HCL 4 MG/2ML IJ SOLN: 4.0000 mg | Freq: Four times a day (QID) | INTRAMUSCULAR | Status: DC | PRN

## 2020-09-04 MED ORDER — APIXABAN 5 MG PO TABS
5.0000 mg | ORAL_TABLET | Freq: Two times a day (BID) | ORAL | Status: DC
  Administered 2020-09-04 – 2020-09-05 (×3): 5 mg via ORAL
  Filled 2020-09-04 (×2): qty 1

## 2020-09-04 MED ORDER — ACETAMINOPHEN 325 MG PO TABS: 650.0000 mg | ORAL_TABLET | ORAL | Status: DC | PRN

## 2020-09-04 MED ORDER — MAGNESIUM SULFATE 50 % IJ SOLN
3.0000 g | Freq: Once | INTRAVENOUS | Status: AC
  Administered 2020-09-04: 3 g via INTRAVENOUS
  Filled 2020-09-04: qty 6

## 2020-09-04 MED ORDER — HYDROCORTISONE ACETATE 25 MG RE SUPP: 25.0000 mg | Freq: Two times a day (BID) | RECTAL | Status: DC

## 2020-09-04 MED ORDER — HYDRALAZINE HCL 20 MG/ML IJ SOLN: 10.0000 mg | INTRAMUSCULAR | Status: AC | PRN

## 2020-09-04 MED ORDER — SODIUM CHLORIDE 0.9% FLUSH
3.0000 mL | Freq: Two times a day (BID) | INTRAVENOUS | Status: DC
  Administered 2020-09-04 (×2): 3 mL via INTRAVENOUS

## 2020-09-04 MED ORDER — POTASSIUM CHLORIDE CRYS ER 20 MEQ PO TBCR
40.0000 meq | EXTENDED_RELEASE_TABLET | ORAL | Status: AC
  Administered 2020-09-04 (×2): 40 meq via ORAL
  Filled 2020-09-04 (×2): qty 2

## 2020-09-04 SURGICAL SUPPLY — 11 items
CATH SWAN GANZ 7F STRAIGHT (CATHETERS) ×1 IMPLANT
GLIDESHEATH SLENDER 7FR .021G (SHEATH) ×1 IMPLANT
GUIDEWIRE .025 260CM (WIRE) ×1 IMPLANT
MAT PREVALON FULL STRYKER (MISCELLANEOUS) ×1 IMPLANT
PACK CARDIAC CATHETERIZATION (CUSTOM PROCEDURE TRAY) ×2 IMPLANT
PROTECTION STATION PRESSURIZED (MISCELLANEOUS) ×2
STATION PROTECTION PRESSURIZED (MISCELLANEOUS) IMPLANT
TRANSDUCER W/STOPCOCK (MISCELLANEOUS) ×2 IMPLANT
TUBING ART PRESS 72  MALE/FEM (TUBING) ×2
TUBING ART PRESS 72 MALE/FEM (TUBING) IMPLANT
WIRE EMERALD 3MM-J .025X260CM (WIRE) ×1 IMPLANT

## 2020-09-04 NOTE — Progress Notes (Signed)
Remote pacemaker transmission.   

## 2020-09-04 NOTE — Progress Notes (Signed)
Pt does not c/o any pain. His appetite and oral intake is adequate. Pt had his heart cath done which was uneventful. Pt's needs are met and safety measures are maintained. Pt is a possible discharge tomorrow.

## 2020-09-04 NOTE — Progress Notes (Signed)
PT Cancellation Note  Patient Details Name: Eric Mayer MRN: [id number] DOB: 07/03/1941   Cancelled Treatment:    Reason Eval/Treat Not Completed: Medical issues which prohibited therapy.  Has been in heart cath and on bedrest, reattempt at another time.   [name] 09/04/2020, 11:00 AM   Mee Hives, PT MS Acute Rehab Dept. Number: [place] and [place]

## 2020-09-04 NOTE — Progress Notes (Signed)
[facility]                                                                              Patient Demographics  Eric Mayer, is a 80 y.o. male, DOB - 02/18/1941, OX:[id number]  Admit date - 08/30/2020   Admitting Physician Prophet Renwick Krystal Eaton, MD  Outpatient Primary MD for the patient is [name], MD  Outpatient specialists:   LOS - 4  days   Medical records reviewed and are as summarized below:    Chief Complaint  Patient presents with  . Chest Pain       Brief summary   Patient was admitted 08/31/2019 with concerns for urosepsis but presented because of transient chest pain. 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 Abnormal lactic acid, hypotension, mild which actually had been going on for some time, abnormal urinalysis.  Treated with ceftriaxone changed to Zosyn, haziness around the gallbladder on CT ultrasound checked.  Also acute on chronic kidney injury.  Mild.  Troponins elevated not thought to be from MI.  1/17: BP has been soft, decrease metoprolol and started on Lasix 20 mg IV secondary to fluid overload 1/18: CHF/cardiology consulted 1/20: Right heart cath    Assessment & Plan    Principal Problem: Sepsis secondary to UTI, POA -Presented with hematuria and dysuria, lactic acidosis, hypotension, AKI -urine culture apparently not sent from ED, UA was positive -No prior urine cultures to compare with.  Urine culture from 1/17 negative, blood  cultures negative so far -CT abdomen with concern of possible cholecystitis however abdominal ultrasound negative for acute cholecystitis. -Procalcitonin improving 0.74 <-1.1.  Continue IV Zosyn -Discontinue Jardiance with UTIs  Active Problems: Hematuria in the setting of anticoagulation and UTI -Per patient, hematuria cleared -H&H stable  Lactic acidosis Resolved  Acute on chronic diastolic CHF -2D echo 0000000 had shown EF of 60 to 65% -Follows Dr. Haroldine Laws outpatient, diuretics, Delene Loll had been held secondary to hypotension -Now 2+ pitting edema, dyspnea on exertion, orthopnea secondary to fluid overload.  Difficult to do aggressive IV diuresis due to soft BP.  Decrease beta-blocker to 25 mg twice daily, -Cardiology following, agree with decreasing beta-blocker and patient received IV Lasix diuresis, weight down from 289lbs on admission to 279 today -Right heart cath today, I's and O's reviewed, 398 cc, good output, weight down to 274lbs -Feeling dyspneic at the time of my examination this morning    Hypertension BP still borderline   Mild acute on CKD stage IIIa -Baseline creatinine 1.2-1.3, presented with creatinine of 1.9 -Creatinine worsened to 1.9 today, follow with diuresis   Persistent atrial fibrillation -Heart rate currently controlled, continue digoxin, beta-blocker -Continue eliquis   Hemorrhoids -Had a small bright red blood on the wipe this morning, visualized in the toilet.  Has a history of hemorrhoids.  No melena. -H&H stable,  placed on Anusol suppository, rectal cream and Tucks pads  Obesity Estimated body mass index is 39.41 kg/m as calculated from the following:   Height as of this encounter: 5\' 10"  (1.778 m).   Weight as of this encounter: 124.6 kg.  Code Status: Full CODE STATUS DVT Prophylaxis: Eliquis Family Communication: Discussed all imaging results, lab results, explained to the patient.    Disposition Plan:     Status is:  Inpatient  Remains inpatient appropriate because:Inpatient level of care appropriate due to severity of illness   Dispo: The patient is from: Home              Anticipated d/c is to: Home              Anticipated d/c date is: 2 days              Patient currently is not medically stable to d/c.  Has significant fluid overload, cardiology following, right heart cath today.  Hopefully DC home tomorrow if no further episodes of bleeding and able to ambulate without dizziness and severe dyspnea on exertion    Time Spent in minutes   35 minutes  Procedures:  Right heart cath  Consultants:   Cardiology  Antimicrobials:   Anti-infectives (From admission, onward)   Start     Dose/Rate Route Frequency Ordered Stop   08/31/20 0415  piperacillin-tazobactam (ZOSYN) IVPB 3.375 g       "Followed by" Linked Group Details   3.375 g 12.5 mL/hr over 240 Minutes Intravenous Every 8 hours 08/30/20 2145     08/30/20 2145  piperacillin-tazobactam (ZOSYN) IVPB 3.375 g       "Followed by" Linked Group Details   3.375 g 100 mL/hr over 30 Minutes Intravenous  Once 08/30/20 2145 08/30/20 2336   08/30/20 1600  cefTRIAXone (ROCEPHIN) 1 g in sodium chloride 0.9 % 100 mL IVPB  Status:  Discontinued        1 g 200 mL/hr over 30 Minutes Intravenous Every 24 hours 08/30/20 1556 08/30/20 2145         Medications  Scheduled Meds: . apixaban  5 mg Oral BID  . buPROPion  300 mg Oral Daily  . digoxin  0.125 mg Oral Daily  . escitalopram  10 mg Oral Daily  . hydrocortisone  25 mg Rectal BID  . mirabegron ER  50 mg Oral Daily  . sodium chloride flush  3 mL Intravenous Q12H  . sodium chloride flush  3 mL Intravenous Q12H  . tamsulosin  0.8 mg Oral QHS   Continuous Infusions: . sodium chloride 500 mL (09/02/20 1102)  . sodium chloride    . magnesium sulfate LVP 250-500 ml 3 g (09/04/20 1157)  . piperacillin-tazobactam (ZOSYN)  IV 3.375 g (09/04/20 0203)   PRN Meds:.sodium chloride, sodium chloride,  acetaminophen **OR** acetaminophen, acetaminophen, Gerhardt's butt cream, hydrALAZINE, ipratropium-albuterol, labetalol, ondansetron **OR** ondansetron (ZOFRAN) IV, ondansetron (ZOFRAN) IV, polyethylene glycol, sodium chloride flush, witch hazel-glycerin      Subjective:   Eric Mayer was seen and examined today.  Small bright red blood on the wipes this morning.  Feeling winded on ambulation.  Hematuria improved.  Still has swelling in the legs.  No chest pain.  Objective:   Vitals:   09/04/20 0934 09/04/20 1130 09/04/20 1149 09/04/20 1414  BP:  (!) 108/46  (!) 105/44  Pulse:  73  73  Resp:  (!) 28  (!) 32  Temp:   97.9 F (36.6 C) 97.8 F (  36.6 C)  TempSrc:   Oral Oral  SpO2: 97% 99%  94%  Weight:      Height:        Intake/Output Summary (Last 24 hours) at 09/04/2020 1504 Last data filed at 09/04/2020 1149 Gross per 24 hour  Intake 540 ml  Output 2575 ml  Net -2035 ml     Wt Readings from Last 3 Encounters:  09/04/20 124.6 kg  07/14/20 127.3 kg  06/09/20 126.6 kg    Physical Exam  General: Alert and oriented x 3, NAD  Cardiovascular: S1 S2 clear, RRR.  2+ pedal edema b/l  Respiratory: Diminished breath sounds throughout no wheezing  Gastrointestinal: Soft, nontender, nondistended, NBS  Ext: 2+ pedal edema bilaterally  Neuro: no new deficits  Musculoskeletal: No cyanosis, clubbing  Skin: No rashes  Psych: Normal affect and demeanor, alert and oriented x3       Data Reviewed:  I have personally reviewed following labs and imaging studies  Micro Results Recent Results (from the past 240 hour(s))  Culture, blood (Routine x 2)     Status: None   Collection Time: 08/30/20  2:35 PM   Specimen: BLOOD RIGHT ARM  Result Value Ref Range Status   Specimen Description BLOOD RIGHT ARM  Final   Special Requests   Final    BOTTLES DRAWN AEROBIC AND ANAEROBIC Blood Culture results may not be optimal due to an excessive volume of blood received in culture  bottles   Culture   Final    NO GROWTH 5 DAYS Performed at [facility], [place] [address], [place], [place] [address]    Report Status 09/04/2020 FINAL  Final  Culture, blood (Routine x 2)     Status: None   Collection Time: 08/30/20  5:08 PM   Specimen: BLOOD RIGHT HAND  Result Value Ref Range Status   Specimen Description BLOOD RIGHT HAND  Final   Special Requests   Final    BOTTLES DRAWN AEROBIC AND ANAEROBIC Blood Culture results may not be optimal due to an inadequate volume of blood received in culture bottles   Culture   Final    NO GROWTH 5 DAYS Performed at [facility], [place] [address] [place], [place] [address]    Report Status 09/04/2020 FINAL  Final  Resp Panel by RT-PCR (Flu A&B, Covid) Nasopharyngeal Swab     Status: None   Collection Time: 08/30/20  5:22 PM   Specimen: Nasopharyngeal Swab; Nasopharyngeal(NP) swabs in vial transport medium  Result Value Ref Range Status   SARS Coronavirus 2 by RT PCR NEGATIVE NEGATIVE Final    Comment: (NOTE) SARS-CoV-2 target nucleic acids are NOT DETECTED.  The SARS-CoV-2 RNA is generally detectable in upper respiratory specimens during the acute phase of infection. The lowest concentration of SARS-CoV-2 viral copies this assay can detect is 138 copies/mL. A negative result does not preclude SARS-Cov-2 infection and should not be used as the sole basis for treatment or other patient management decisions. A negative result may occur with  improper specimen collection/handling, submission of specimen other than nasopharyngeal swab, presence of viral mutation(s) within the areas targeted by this assay, and inadequate number of viral copies(<138 copies/mL). A negative result must be combined with clinical observations, patient history, and epidemiological information. The expected result is Negative.  Fact Sheet for Patients:  EntrepreneurPulse.com.au  Fact Sheet for Healthcare Providers:   IncredibleEmployment.be  This test is no t yet approved or cleared by the [place] FDA  and  has been authorized for detection and/or diagnosis of SARS-CoV-2 by FDA under an Emergency Use Authorization (EUA). This EUA will remain  in effect (meaning this test can be used) for the duration of the COVID-19 declaration under Section 564(b)(1) of the Act, 21 U.S.C.section 360bbb-3(b)(1), unless the authorization is terminated  or revoked sooner.       Influenza A by PCR NEGATIVE NEGATIVE Final   Influenza B by PCR NEGATIVE NEGATIVE Final    Comment: (NOTE) The Xpert Xpress SARS-CoV-2/FLU/RSV plus assay is intended as an aid in the diagnosis of influenza from Nasopharyngeal swab specimens and should not be used as a sole basis for treatment. Nasal washings and aspirates are unacceptable for Xpert Xpress SARS-CoV-2/FLU/RSV testing.  Fact Sheet for Patients: EntrepreneurPulse.com.au  Fact Sheet for Healthcare Providers: IncredibleEmployment.be  This test is not yet approved or cleared by the [place] FDA and has been authorized for detection and/or diagnosis of SARS-CoV-2 by FDA under an Emergency Use Authorization (EUA). This EUA will remain in effect (meaning this test can be used) for the duration of the COVID-19 declaration under Section 564(b)(1) of the Act, 21 U.S.C. section 360bbb-3(b)(1), unless the authorization is terminated or revoked.  Performed at [facility], [place] [address], [place], [place] [address]   Culture, Urine     Status: None   Collection Time: 09/01/20 10:43 AM   Specimen: Urine, Clean Catch  Result Value Ref Range Status   Specimen Description URINE, CLEAN CATCH  Final   Special Requests Zosyn  Final   Culture   Final    NO GROWTH Performed at [facility], [place] [address], [place], [place] [address]    Report Status 09/02/2020 FINAL  Final    Radiology Reports CT ABDOMEN  PELVIS WO CONTRAST  Result Date: 08/31/2020 CLINICAL DATA:  Sepsis due to a grand negative UTI. EXAM: CT ABDOMEN AND PELVIS WITHOUT CONTRAST TECHNIQUE: Multidetector CT imaging of the abdomen and pelvis was performed following the standard protocol without IV contrast. COMPARISON:  October 21, 2017 FINDINGS: Lower chest: The lung bases are clear. The heart size is normal. Hepatobiliary: The liver is normal. There is cholelithiasis. There is mild haziness about the gallbladder.There is no biliary ductal dilation. Pancreas: Normal contours without ductal dilatation. No peripancreatic fluid collection. Spleen: Unremarkable. Adrenals/Urinary Tract: --Adrenal glands: Unremarkable. --Right kidney/ureter: There is a punctate nonobstructing stone. --Left kidney/ureter: No hydronephrosis or radiopaque kidney stones. --Urinary bladder: Unremarkable. Stomach/Bowel: --Stomach/Duodenum: No hiatal hernia or other gastric abnormality. Normal duodenal course and caliber. --Small bowel: Unremarkable. --Colon: There is an above average amount of stool in the colon. There is liquid stool in the transverse colon. --Appendix: Normal. Vascular/Lymphatic: Atherosclerotic calcification is present within the non-aneurysmal abdominal aorta, without hemodynamically significant stenosis. --No retroperitoneal lymphadenopathy. --No mesenteric lymphadenopathy. --No pelvic or inguinal lymphadenopathy. Reproductive: Brachytherapy beads are noted in the prostate gland. Other: There are fat containing bilateral inguinal hernias. There are stable postsurgical changes in the left inguinal region. Musculoskeletal. No acute displaced fractures. IMPRESSION: 1. Cholelithiasis with mild haziness about the gallbladder. If there is concern for acute cholecystitis, recommend further evaluation with right upper quadrant ultrasound. 2. Nonobstructive right nephrolithiasis. 3. Large stool burden. Liquid stool in the transverse colon consistent with a possible  underlying diarrheal illness. 4. Additional chronic findings as detailed above. Aortic Atherosclerosis (ICD10-I70.0). Electronically Signed   By: Constance Holster M.D.   On: 08/31/2020 00:06   DG Chest 2 View  Result Date: 08/30/2020 CLINICAL  DATA:  Chest pain and dyspnea EXAM: CHEST - 2 VIEW COMPARISON:  02/21/2020 chest radiograph. FINDINGS: Stable configuration of 2 lead left subclavian pacemaker. Stable cardiomediastinal silhouette with mild cardiomegaly. No pneumothorax. No pleural effusion. Lungs appear clear, with no acute consolidative airspace disease and no pulmonary edema. IMPRESSION: Stable mild cardiomegaly without pulmonary edema. No active pulmonary disease. Electronically Signed   By: Ilona Sorrel M.D.   On: 08/30/2020 15:00   CARDIAC CATHETERIZATION  Result Date: 09/04/2020 Findings: RA = 3 RV = 38/5 PA = 41/8 (24) PCW = 15 (v = 26) Fick cardiac output/index = 7.6/3.1 Thermo CO/CI = 8.6/3.6 PVR = 1.0 WU FA sat = 97% PA sat = 61%, 62% SVC sat = 61% Assessment: 1. Well controlled volume status with moderate v-waves in PCW tracing suggestive of mitral regurgitation vs diastolic dysfunction 2. High cardiac output with no evidence of intracardiac shunting Plan/Discussion: Fluid status optimized. Suspect venous insufficiency playing a role in residual LE edema. Glori Bickers, MD 10:19 AM  CUP PACEART REMOTE DEVICE CHECK  Result Date: 08/21/2020 Scheduled remote reviewed. Normal device function.  Known AF, on [place], programmed DDI.  There was one NSVT arrhythmia detected that was greater than 10 beats, sent to triage. Next remote 91 days. Kathy Breach, RN, CCDS, [employer]  US Abdomen Limited RUQ (LIVER/GB)  Result Date: 08/31/2020 CLINICAL DATA:  Cholecystitis EXAM: ULTRASOUND ABDOMEN LIMITED RIGHT UPPER QUADRANT COMPARISON:  08/30/2020 CT abdomen/pelvis. FINDINGS: Gallbladder: A few layering shadowing gallstones in the gallbladder, largest 1.2 cm. No definite gallbladder wall  thickening (gallbladder wall thickness 2.6 mm). No sonographic Murphy's sign. No pericholecystic fluid. Common bile duct: Diameter: 3 mm Liver: Diffusely echogenic liver parenchyma. No definite liver surface irregularity. No liver mass, noting decreased sensitivity in the setting of an echogenic liver. Portal vein is patent on color Doppler imaging with normal direction of blood flow towards the liver. Other: None. IMPRESSION: 1. Cholelithiasis.  No sonographic evidence of acute cholecystitis. 2. No biliary ductal dilatation. 3. Echogenic liver, a nonspecific finding that can be due to hepatic steatosis and/or fibrosis. Recommend correlation with liver function tests. Consider outpatient hepatic elastography for further liver fibrosis risk stratification, as clinically warranted. Electronically Signed   By: Ilona Sorrel M.D.   On: 08/31/2020 09:25    Lab Data:  CBC: Recent Labs  Lab 08/30/20 1434 08/31/20 0255 09/02/20 0440 09/03/20 0447 09/04/20 0523  WBC 14.4* 6.1 4.9 7.5 8.1  NEUTROABS  --  4.7  --   --   --   HGB 10.9* 8.8* 9.1* 8.8* 8.9*  HCT 36.7* 29.6* 29.2* 29.3* 29.7*  MCV 94.3 94.9 93.6 93.9 94.0  PLT 189 127* 154 140* [id number]   Basic Metabolic Panel: Recent Labs  Lab 08/30/20 2231 08/31/20 0255 09/01/20 0645 09/02/20 0440 09/03/20 0447 09/04/20 0523  NA 135 140 138 140 140  --   K 5.3* 5.2* 4.8 4.5 3.9  --   CL 112* 114* 111 113* 114*  --   CO2 16* 19* 18* 18* 17*  --   GLUCOSE 98 113* 108* 105* 98  --   BUN 28* 30* 25* 22 20  --   CREATININE 1.60* 1.92* 1.68* 1.75* 1.98*  --   CALCIUM 8.0* 8.2* 8.7* 8.7* 8.3*  --   MG  --  1.7  --   --  1.5* 1.9  PHOS 2.7  --   --   --   --   --    GFR: Estimated  Creatinine Clearance: 41.4 mL/min (A) (by C-G formula based on SCr of 1.98 mg/dL (H)). Liver Function Tests: Recent Labs  Lab 08/30/20 1434 08/30/20 2231 08/31/20 0255  AST 23  --  23  ALT 36  --  36  ALKPHOS 123  --  97  BILITOT 0.8  --  0.8  PROT 5.8*  --  4.8*   ALBUMIN 3.2* 2.7* 2.6*   No results for input(s): LIPASE, AMYLASE in the last 168 hours. No results for input(s): AMMONIA in the last 168 hours. Coagulation Profile: Recent Labs  Lab 08/30/20 1425 08/31/20 0255  INR 1.3* 1.7*   Cardiac Enzymes: No results for input(s): CKTOTAL, CKMB, CKMBINDEX, TROPONINI in the last 168 hours. BNP (last 3 results) Recent Labs    06/03/20 1520  PROBNP 948*   HbA1C: No results for input(s): HGBA1C in the last 72 hours. CBG: No results for input(s): GLUCAP in the last 168 hours. Lipid Profile: No results for input(s): CHOL, HDL, LDLCALC, TRIG, CHOLHDL, LDLDIRECT in the last 72 hours. Thyroid Function Tests: No results for input(s): TSH, T4TOTAL, FREET4, T3FREE, THYROIDAB in the last 72 hours. Anemia Panel: No results for input(s): VITAMINB12, FOLATE, FERRITIN, TIBC, IRON, RETICCTPCT in the last 72 hours. Urine analysis:    Component Value Date/Time   COLORURINE YELLOW 08/30/2020 1820   APPEARANCEUR CLEAR 08/30/2020 1820   LABSPEC 1.025 08/30/2020 1820   PHURINE 5.5 08/30/2020 1820   GLUCOSEU NEGATIVE 08/30/2020 1820   HGBUR MODERATE (A) 08/30/2020 1820   BILIRUBINUR NEGATIVE 08/30/2020 1820   KETONESUR NEGATIVE 08/30/2020 1820   PROTEINUR 30 (A) 08/30/2020 1820   UROBILINOGEN 0.2 12/05/2014 1052   NITRITE POSITIVE (A) 08/30/2020 1820   LEUKOCYTESUR MODERATE (A) 08/30/2020 1820     Arlethia Basso M.D. [facility] 09/04/2020, 3:04 PM   Call night coverage person covering after 7pm

## 2020-09-04 NOTE — Interval H&P Note (Signed)
History and Physical Interval Note:  09/04/2020 9:47 AM  Eric Mayer  has presented today for surgery, with the diagnosis of heart failure.  The various methods of treatment have been discussed with the patient and family. After consideration of risks, benefits and other options for treatment, the patient has consented to  Procedure(s): RIGHT HEART CATH (N/A) as a surgical intervention.  The patient's history has been reviewed, patient examined, no change in status, stable for surgery.  I have reviewed the patient's chart and labs.  Questions were answered to the patient's satisfaction.     [name]

## 2020-09-04 NOTE — Progress Notes (Signed)
PT Cancellation Note  Patient Details Name: Eric Mayer MRN: [id number] DOB: 04/12/1941   Cancelled Treatment:    Reason Eval/Treat Not Completed: Fatigue/lethargy limiting ability to participate.  Still very t ired from his catheterization this am and asking to wait to tomorrow for therapy mobility or even ex.  Follow up at another time.   [name] 09/04/2020, 4:13 PM   Mee Hives, PT MS Acute Rehab Dept. Number: [place] and [place]

## 2020-09-04 NOTE — Progress Notes (Addendum)
Advanced Heart Failure Rounding Note  PCP-Cardiologist: [name], MD   Subjective:    -2.6L in UOP yesterday w/ IV Lasix. Wt not accurate. Will ask RN to re-weigh  Scr has been trending up. F/u BMP pending.   He denies resting dyspnea, continues w/ increased exertional dyspnea.   Scheduled for RHC this morning.     Objective:   Weight Range: 126.8 kg Body mass index is 40.12 kg/m.   Vital Signs:   Temp:  [97.8 F (36.6 C)-98.8 F (37.1 C)] 98.3 F (36.8 C) (01/20 0532) Pulse Rate:  [68-75] 70 (01/20 0532) Resp:  [15-22] 20 (01/20 0532) BP: (87-130)/(42-51) 130/51 (01/20 0532) SpO2:  [95 %-98 %] 96 % (01/20 0532) Last BM Date: 09/02/20  Weight change: Filed Weights   09/01/20 0533 09/02/20 0441 09/03/20 0315  Weight: 131.4 kg 129.5 kg 126.8 kg    Intake/Output:   Intake/Output Summary (Last 24 hours) at 09/04/2020 0806 Last data filed at 09/04/2020 0203 Gross per 24 hour  Intake 1080 ml  Output 2600 ml  Net -1520 ml      Physical Exam    PHYSICAL EXAM: General:  Well appearing obese male. No respiratory difficulty HEENT: normal Neck: supple. JVD 10 cm. Carotids 2+ bilat; no bruits. No lymphadenopathy or thyromegaly appreciated. Cor: PMI nondisplaced. Regular rate & rhythm. No rubs, gallops or murmurs. Lungs: clear Abdomen: obese, soft, nontender, nondistended. No hepatosplenomegaly. No bruits or masses. Good bowel sounds. Extremities: no cyanosis, clubbing, rash, 1+ bilateral LEE + bilateral unna boots  Neuro: alert & oriented x 3, cranial nerves grossly intact. moves all 4 extremities w/o difficulty. Affect pleasant.   Telemetry   A fib 80s   EKG    N/a   Labs    CBC Recent Labs    09/03/20 0447 09/04/20 0523  WBC 7.5 8.1  HGB 8.8* 8.9*  HCT 29.3* 29.7*  MCV 93.9 94.0  PLT 140* 914   Basic Metabolic Panel Recent Labs    09/02/20 0440 09/03/20 0447 09/04/20 0523  NA 140 140  --   K 4.5 3.9  --   CL 113* 114*  --    CO2 18* 17*  --   GLUCOSE 105* 98  --   BUN 22 20  --   CREATININE 1.75* 1.98*  --   CALCIUM 8.7* 8.3*  --   MG  --  1.5* 1.9   Liver Function Tests No results for input(s): AST, ALT, ALKPHOS, BILITOT, PROT, ALBUMIN in the last 72 hours. No results for input(s): LIPASE, AMYLASE in the last 72 hours. Cardiac Enzymes No results for input(s): CKTOTAL, CKMB, CKMBINDEX, TROPONINI in the last 72 hours.  BNP: BNP (last 3 results) Recent Labs    04/22/20 1451  BNP 151.1*    ProBNP (last 3 results) Recent Labs    06/03/20 1520  PROBNP 948*     D-Dimer No results for input(s): DDIMER in the last 72 hours. Hemoglobin A1C No results for input(s): HGBA1C in the last 72 hours. Fasting Lipid Panel No results for input(s): CHOL, HDL, LDLCALC, TRIG, CHOLHDL, LDLDIRECT in the last 72 hours. Thyroid Function Tests No results for input(s): TSH, T4TOTAL, T3FREE, THYROIDAB in the last 72 hours.  Invalid input(s): FREET3  Other results:   Imaging    No results found.   Medications:     Scheduled Medications: . buPROPion  300 mg Oral Daily  . digoxin  0.125 mg Oral Daily  . escitalopram  10 mg Oral Daily  . furosemide  40 mg Intravenous BID  . hydrocortisone  25 mg Rectal BID  . mirabegron ER  50 mg Oral Daily  . sodium chloride flush  3 mL Intravenous Q12H  . tamsulosin  0.8 mg Oral QHS    Infusions: . sodium chloride 500 mL (09/02/20 1102)  . sodium chloride    . sodium chloride 10 mL/hr at 09/04/20 0548  . magnesium sulfate LVP 250-500 ml    . piperacillin-tazobactam (ZOSYN)  IV 3.375 g (09/04/20 0203)    PRN Medications: sodium chloride, sodium chloride, acetaminophen **OR** acetaminophen, Gerhardt's butt cream, ipratropium-albuterol, ondansetron **OR** ondansetron (ZOFRAN) IV, polyethylene glycol, sodium chloride flush, witch hazel-glycerin   Assessment/Plan   1. Urosepsis, lactic acidosis.  -Lactic acid 3.4>1.4 . Given fluid resuscitation.  -Urine  culture no growth  -On zosyn.   2. A/C Diastolic Heart Failure  - Echo EF 60 to 65% with severe LVH. The septum was 2.5 cm in the posterior wall 2.1 cm. RV ok - PYP 10/21 negative  - diuresing but w/ worsening exertional dyspnea + rising SCr. Plan RHC today  -Plan to keep off jardiance with UTI.   4. AKI  - Creatinine baseline 1.2-1.3  - Creatinine on admit 1.4>>1.75>>1.98>>pending  - ? Cardiorenal syndrome. RHC today   5. Permanent A fib  -permanent AF s/p AVN ablation in 5/20 (unable to get CHB) and CRT, -Rate controlled.  -continue metoprolol + dig.  - Has been on dig long term per EP. Dig level 0.5 .  -Continue eliquis 5 mg twice a day.   6. OSA  -Uses CPAP   7. Restrictive lung disease -PFTs 5/21: FEV1 1.66 (59%) FVC 2.28 (59%) DLCO 69%  8. Morbid Obesity  -s/p gastric bypass Body mass index is 40.95 kg/m.   Length of Stay: [address], PA-C  09/04/2020, 8:06 AM  Advanced Heart Failure Team Pager [phone] (M-F; 7a - 4p)  Please contact [facility] for night-coverage after hours (4p -7a ) and weekends on [email or web address]   Patient seen and examined with the above-signed Advanced Practice Provider and/or Housestaff. I personally reviewed laboratory data, imaging studies and relevant notes. I independently examined the patient and formulated the important aspects of the plan. I have edited the note to reflect any of my changes or salient points. I have personally discussed the plan with the patient and/or family.  Continues to complain of SOB and LE edema.   RHC today with normal volume status and output.   RA = 3 RV = 38/5 PA = 41/8 (24) PCW = 15 (v = 26) Fick cardiac output/index = 7.6/3.1 Thermo CO/CI = 8.6/3.6 PVR = 1.0 WU FA sat = 97% PA sat = 61%, 62% SVC sat = 61%  General:  Well appearing. No resp difficulty HEENT: normal Neck: supple. no JVD. Carotids 2+ bilat; no bruits. No lymphadenopathy or thryomegaly appreciated. Cor: PMI  nondisplaced. Irregular rate & rhythm. No rubs, gallops or murmurs. Lungs: clear Abdomen: soft, nontender, nondistended. No hepatosplenomegaly. No bruits or masses. Good bowel sounds. Extremities: no cyanosis, clubbing, rash, 1-2+ edema L>R Neuro: alert & orientedx3, cranial nerves grossly intact. moves all 4 extremities w/o difficulty. Affect pleasant  Volume status looks good. Can go home today if renal function stable. Would recommend compression socks. Can f/u in HF Clinic.    Would resume home meds:  Entresto 24/26 bid Lopressor 50 bid Bumex 2mg  daily Apixaban 5 bid Jardiance (if he can tolerate)  Can stop digoxin.   F/u HF Clinic.   Glori Bickers, MD  10:27 AM

## 2020-09-05 DIAGNOSIS — I5032 Chronic diastolic (congestive) heart failure: Secondary | ICD-10-CM | POA: Diagnosis not present

## 2020-09-05 DIAGNOSIS — N390 Urinary tract infection, site not specified: Secondary | ICD-10-CM | POA: Diagnosis not present

## 2020-09-05 DIAGNOSIS — A4150 Gram-negative sepsis, unspecified: Secondary | ICD-10-CM | POA: Diagnosis not present

## 2020-09-05 DIAGNOSIS — N1831 Chronic kidney disease, stage 3a: Secondary | ICD-10-CM | POA: Diagnosis not present

## 2020-09-05 LAB — CBC
HCT: 29.4 % — ABNORMAL LOW (ref 39.0–52.0)
Hemoglobin: 8.9 g/dL — ABNORMAL LOW (ref 13.0–17.0)
MCH: 28.1 pg (ref 26.0–34.0)
MCHC: 30.3 g/dL (ref 30.0–36.0)
MCV: 92.7 fL (ref 80.0–100.0)
Platelets: 163 10*3/uL (ref 150–400)
RBC: 3.17 MIL/uL — ABNORMAL LOW (ref 4.22–5.81)
RDW: 15.9 % — ABNORMAL HIGH (ref 11.5–15.5)
WBC: 8.1 10*3/uL (ref 4.0–10.5)
nRBC: 0 % (ref 0.0–0.2)

## 2020-09-05 LAB — BASIC METABOLIC PANEL
Anion gap: 10 (ref 5–15)
BUN: 16 mg/dL (ref 8–23)
CO2: 18 mmol/L — ABNORMAL LOW (ref 22–32)
Calcium: 8.5 mg/dL — ABNORMAL LOW (ref 8.9–10.3)
Chloride: 113 mmol/L — ABNORMAL HIGH (ref 98–111)
Creatinine, Ser: 1.9 mg/dL — ABNORMAL HIGH (ref 0.61–1.24)
GFR, Estimated: 36 mL/min — ABNORMAL LOW (ref >60)
Glucose, Bld: 112 mg/dL — ABNORMAL HIGH (ref 70–99)
Potassium: 3.5 mmol/L (ref 3.5–5.1)
Sodium: 141 mmol/L (ref 135–145)

## 2020-09-05 MED ORDER — ENTRESTO 24-26 MG PO TABS: 1.0000 | ORAL_TABLET | Freq: Two times a day (BID) | ORAL | 5 refills | Status: DC

## 2020-09-05 MED ORDER — METOPROLOL TARTRATE 50 MG PO TABS: 50.0000 mg | ORAL_TABLET | Freq: Two times a day (BID) | ORAL | 3 refills | Status: DC

## 2020-09-05 MED ORDER — POTASSIUM CHLORIDE CRYS ER 20 MEQ PO TBCR: 20.0000 meq | EXTENDED_RELEASE_TABLET | Freq: Every day | ORAL | Status: DC

## 2020-09-05 MED ORDER — BUMETANIDE 2 MG PO TABS: 2.0000 mg | ORAL_TABLET | Freq: Every day | ORAL | 4 refills | Status: DC

## 2020-09-05 MED ORDER — METOPROLOL TARTRATE 50 MG PO TABS
50.0000 mg | ORAL_TABLET | Freq: Two times a day (BID) | ORAL | Status: DC
  Administered 2020-09-05: 50 mg via ORAL
  Filled 2020-09-05: qty 1

## 2020-09-05 MED ORDER — WITCH HAZEL-GLYCERIN EX PADS: MEDICATED_PAD | CUTANEOUS | 12 refills | Status: AC | PRN

## 2020-09-05 NOTE — Progress Notes (Signed)
Physical Therapy Treatment Patient Details Name: Eric Mayer MRN: [id number] DOB: [birth year] Today's Date: 09/05/2020    History of Present Illness 80 y/o male who presents with dysuria, hematuria, chest discomfort and complaints of low blood pressure. Found to have urosepsis, lactic acidosis, acute on chronic diastolic heart failure and acute on chronic kidney injury.  Heart cath on 1/20, now reassessing PT.  PMH includes HTN, incomplete RBBB, sarcoma left groin, depression, prostate ca, OSA, permanent A-fib (S/P ablation x 2, pacemaker placement 02/2020), CKD stage IIIa, CHF.    PT Comments    Pt was seen for post cath mobility and noted HR was up to 168 at the highest with pt sitting to rest after 70'.  He returned to his room and had the same reaction with pt trying to minimize conversation and speed.  Sats were 97-100% throughout.  Recovery of HR to baseline requires about 2 minutes.  Follow up with him upgraded to HHPT for management of his HR and to instruct him with safe limits to build endurance.  Follow acutely as stay permits for endurance and strengthening to LE's.   Follow Up Recommendations  Home health PT;Supervision for mobility/OOB     Equipment Recommendations  None recommended by PT    Recommendations for Other Services       Precautions / Restrictions Precautions Precautions: Fall Precaution Comments: soft BP Restrictions Weight Bearing Restrictions: No    Mobility  Bed Mobility               General bed mobility comments: up in chair when PT arrived  Transfers Overall transfer level: Needs assistance Equipment used: Rolling walker (2 wheeled);1 person hand held assist Transfers: Sit to/from Stand Sit to Stand: Supervision            Ambulation/Gait Ambulation/Gait assistance: Supervision Gait Distance (Feet): 140 Feet (70 x 2) Assistive device: Rolling walker (2 wheeled) Gait Pattern/deviations: Step-through pattern;Decreased stride  length;Trunk flexed Gait velocity: controlled with indoor pace Gait velocity interpretation: <1.31 ft/sec, indicative of household Metallurgist Rankin (Stroke Patients Only)       Balance Overall balance assessment: Needs assistance Sitting-balance support: Feet supported Sitting balance-Leahy Scale: Good     Standing balance support: Bilateral upper extremity supported;During functional activity Standing balance-Leahy Scale: Fair Standing balance comment: Requires UE support for standing.                            Cognition Arousal/Alertness: Awake/alert Behavior During Therapy: WFL for tasks assessed/performed Overall Cognitive Status: Within Functional Limits for tasks assessed                                        Exercises      General Comments General comments (skin integrity, edema, etc.): HR is controlled at baseline, walk pre-HR is 80, after 77' is 168 with sitting rest given to return to baseline      Pertinent Vitals/Pain Pain Assessment: No/denies pain Faces Pain Scale: Hurts a little bit Pain Location: low back Pain Descriptors / Indicators: Sore Pain Intervention(s): Monitored during session    Home Living Family/patient expects to be discharged to:: Private residence Living Arrangements: Spouse/significant other Available Help at Discharge: Family;Available 24 hours/day Type of  Home: Apartment Home Access: Level entry   Home Layout: One level Home Equipment: [place] - 2 wheels;Walker - 4 wheels;Other (comment);Shower seat;Toilet riser;Bedside commode;Electric scooter      Prior Function Level of Independence: Independent with assistive device(s)          PT Goals (current goals can now be found in the care plan section) Acute Rehab PT Goals Patient Stated Goal: to get better and go home    Frequency    Min 3X/week      PT Plan Discharge plan  needs to be updated    Co-evaluation              AM-PAC PT "6 Clicks" Mobility   Outcome Measure  Help needed turning from your back to your side while in a flat bed without using bedrails?: None Help needed moving from lying on your back to sitting on the side of a flat bed without using bedrails?: A Little Help needed moving to and from a bed to a chair (including a wheelchair)?: A Little Help needed standing up from a chair using your arms (e.g., wheelchair or bedside chair)?: A Little Help needed to walk in hospital room?: A Little Help needed climbing 3-5 steps with a railing? : A Lot 6 Click Score: 18    End of Session Equipment Utilized During Treatment: Gait belt Activity Tolerance: Patient tolerated treatment well;Treatment limited secondary to medical complications (Comment) Patient left: in chair;with call bell/phone within reach;with chair alarm set;with family/visitor present Nurse Communication: Other (comment) (HR with mobility) PT Visit Diagnosis: Other abnormalities of gait and mobility (R26.89);Other (comment)     Time: 7628-3151 PT Time Calculation (min) (ACUTE ONLY): 29 min  Charges:  $Gait Training: 8-22 mins $Therapeutic Activity: 8-22 mins                 Eric Mayer 09/05/2020, 12:46 PM

## 2020-09-05 NOTE — Care Management Important Message (Signed)
Important Message  Patient Details  Name: Eric Mayer MRN: [id number] Date of Birth: Jun 22, 1941   Medicare Important Message Given:  Yes     Shelda Altes 09/05/2020, 10:01 AM

## 2020-09-05 NOTE — Evaluation (Signed)
Occupational Therapy Evaluation Patient Details Name: Eric Mayer MRN: [id number] DOB: [birth year] Today's Date: 09/05/2020    History of Present Illness Patient is a 80 y/o male who presents with dysuria, hematuria, chest discomfort and complaints of low blood pressure. Found to have urosepsis, lactic acidosis, acute on chronic diastolic heart failure and acute on chronic kidney injury. PMH includes HTN, incomplete RBBB, sarcoma left groin, depression, prostate ca, OSA, permanent A-fib (S/P ablation x 2, pacemaker placement 02/2020), CKD stage IIIa, CHF.   Clinical Impression   Patient admitted for the above diagnosis and procedure.  He is close to his baseline for ADL and functional mobility/toileting.  His primary deficits are declines to activity tolerance, unsteadiness with RW, and generalized weakness.  Patient would benefit from [facility] PT to improve the deficits noted above.      Follow Up Recommendations  No OT follow up .  HH PT would be beneficial.     Equipment Recommendations  None recommended by OT    Recommendations for Other Services       Precautions / Restrictions Precautions Precautions: Fall Restrictions Weight Bearing Restrictions: No      Mobility Bed Mobility               General bed mobility comments: In recliner upon entering    Transfers   Equipment used: Rolling walker (2 wheeled) Transfers: Sit to/from Stand Sit to Stand: Supervision              Balance           Standing balance support: Bilateral upper extremity supported Standing balance-Leahy Scale: Poor Standing balance comment: Requires UE support for standing.                           ADL either performed or assessed with clinical judgement   ADL Overall ADL's : Needs assistance/impaired Eating/Feeding: Independent;Sitting   Grooming: Wash/dry hands;Standing;Supervision/safety               Lower Body Dressing: Minimal assistance;Sit to/from  stand Lower Body Dressing Details (indicate cue type and reason): compression stockings. Toilet Transfer: Supervision/safety;Ambulation;RW           Functional mobility during ADLs: Supervision/safety;Rolling walker       Vision         Perception     Praxis      Pertinent Vitals/Pain Pain Assessment: Faces Faces Pain Scale: Hurts a little bit Pain Location: low back Pain Descriptors / Indicators: Sore Pain Intervention(s): Monitored during session     Hand Dominance Right   Extremity/Trunk Assessment Upper Extremity Assessment Upper Extremity Assessment: Generalized weakness   Lower Extremity Assessment Lower Extremity Assessment: Defer to PT evaluation       Communication Communication Communication: No difficulties   Cognition Arousal/Alertness: Awake/alert Behavior During Therapy: WFL for tasks assessed/performed Overall Cognitive Status: Within Functional Limits for tasks assessed                                     General Comments   HR to 160 with mobility.      Exercises     Shoulder Instructions      Home Living Family/patient expects to be discharged to:: Private residence Living Arrangements: Spouse/significant other Available Help at Discharge: Family;Available 24 hours/day Type of Home: Apartment Home Access: Level entry     Home Layout:  One level     Bathroom Shower/Tub: Occupational psychologist: Handicapped height     Home Equipment: Environmental consultant - 2 wheels;Walker - 4 wheels;Other (comment);Shower seat;Toilet riser;Bedside commode;Electric scooter          Prior Functioning/Environment Level of Independence: Independent with assistive device(s)                 OT Problem List: Decreased activity tolerance;Impaired balance (sitting and/or standing)      OT Treatment/Interventions:      OT Goals(Current goals can be found in the care plan section) Acute Rehab OT Goals Patient Stated Goal: to get  better and go home OT Goal Formulation: With patient Time For Goal Achievement: 09/05/20 Potential to Achieve Goals: Good  OT Frequency:     Barriers to D/C:  none          Co-evaluation              AM-PAC OT "6 Clicks" Daily Activity     Outcome Measure Help from another person eating meals?: None Help from another person taking care of personal grooming?: None Help from another person toileting, which includes using toliet, bedpan, or urinal?: A Little Help from another person bathing (including washing, rinsing, drying)?: A Little Help from another person to put on and taking off regular upper body clothing?: A Little Help from another person to put on and taking off regular lower body clothing?: A Little 6 Click Score: 20   End of Session Equipment Utilized During Treatment: Gait belt;Rolling walker Nurse Communication: Mobility status  Activity Tolerance: Patient tolerated treatment well Patient left: in chair;with call bell/phone within reach  OT Visit Diagnosis: Unsteadiness on feet (R26.81)                Time: 3846-6599 OT Time Calculation (min): 23 min Charges:  OT General Charges $OT Visit: 1 Visit OT Evaluation $OT Eval Moderate Complexity: 1 Mod OT Treatments $Self Care/Home Management : 8-22 mins  09/05/2020  Rich, OTR/L  Acute Rehabilitation Services  Office:  [phone]   Metta Clines 09/05/2020, 9:30 AM

## 2020-09-05 NOTE — Discharge Summary (Signed)
Physician Discharge Summary   Patient ID: Eric Mayer MRN: [id number] DOB/AGE: 1941/03/06 80 y.o.  Admit date: 08/30/2020 Discharge date: 09/05/2020  Primary Care Physician:  [name], MD   Recommendations for Outpatient Follow-up:  1. Follow up with PCP in 1-2 weeks 2. Please obtain BMP/CBC in one week  Home Health: Awaiting repeat PT eval Equipment/Devices:   Discharge Condition: stable  CODE STATUS: FULL Diet recommendation: Heart healthy diet   Discharge Diagnoses:    . Sepsis due to UTI ([place]) resolved . Hematuria secondary to UTI (urinary tract infection), resolved . Essential hypertension . Mild acute on chronic kidney disease, stage 3a ([place]) . Acute on chronic diastolic CHF (congestive heart failure) ([place]) . Persistent atrial fibrillation ([place]) . Hyperkalemia . Elevated troponin level not due myocardial infarction . Lactic acidosis . Hemorrhoids ([place]) Obesity  Consults: Cardiology, Dr. Haroldine Laws    Allergies:   Allergies  Allergen Reactions  . Ace Inhibitors Cough  . Xarelto [Rivaroxaban] Other (See Comments)    Joint pain     DISCHARGE MEDICATIONS: Allergies as of 09/05/2020      Reactions   Ace Inhibitors Cough   Xarelto [rivaroxaban] Other (See Comments)   Joint pain      Medication List    STOP taking these medications   digoxin 0.125 MG tablet Commonly known as: LANOXIN     TAKE these medications   acetaminophen 500 MG tablet Commonly known as: TYLENOL Take 1,000 mg by mouth every 6 (six) hours as needed for headache (pain).   amoxicillin 500 MG capsule Commonly known as: AMOXIL Take 2,000 mg by mouth See admin instructions. Take 4 capsules (2000 mg) by mouth one hour prior to dental appointments   apixaban 5 MG Tabs tablet Commonly known as: Eliquis Take 1 tablet (5 mg total) by mouth 2 (two) times daily. Resume with Sunday evening dose.   bumetanide 2 MG tablet Commonly known as: Bumex Take 1 tablet (2 mg total) by  mouth daily.   buPROPion 300 MG 24 hr tablet Commonly known as: WELLBUTRIN XL Take 300 mg by mouth daily.   clotrimazole 1 % cream Commonly known as: LOTRIMIN Apply 1 application topically every 3 (three) days.   cyanocobalamin 1000 MCG/ML injection Commonly known as: (VITAMIN B-12) Inject 1,000 mcg into the muscle every 30 (thirty) days.   empagliflozin 10 MG Tabs tablet Commonly known as: Jardiance Take 1 tablet (10 mg total) by mouth daily before breakfast.   Entresto 24-26 MG Generic drug: sacubitril-valsartan Take 1 tablet by mouth 2 (two) times daily. Start taking on: September 08, 2020 What changed: These instructions start on September 08, 2020. If you are unsure what to do until then, ask your doctor or other care provider.   escitalopram 10 MG tablet Commonly known as: LEXAPRO Take 10 mg by mouth daily.   magnesium oxide 400 MG tablet Commonly known as: MAG-OX TAKE 2 TABLETS EACH MORNING AND 1 TABLETEACH EVENING. What changed: See the new instructions.   metoprolol tartrate 50 MG tablet Commonly known as: LOPRESSOR Take 1 tablet (50 mg total) by mouth 2 (two) times daily. What changed:   when to take this  additional instructions  Another medication with the same name was removed. Continue taking this medication, and follow the directions you see here.   mirabegron ER 50 MG Tb24 tablet Commonly known as: MYRBETRIQ Take 50 mg by mouth daily.   multivitamin with minerals Tabs tablet Take 1 tablet by mouth daily.   potassium chloride  SA 20 MEQ tablet Commonly known as: KLOR-CON Take 1 tablet (20 mEq total) by mouth daily.   PRESERVISION AREDS 2 PO Take 1 capsule by mouth 2 (two) times a day.   Systane Ultra 0.4-0.3 % Soln Generic drug: Polyethyl Glycol-Propyl Glycol Place 1 drop into both eyes daily as needed (for dry eyes).   tamsulosin 0.4 MG Caps capsule Commonly known as: FLOMAX Take 0.8 mg by mouth at bedtime.   Vitamin D3 125 MCG (5000 UT)  Tabs Take 5,000 Units by mouth daily.   witch hazel-glycerin pad Commonly known as: TUCKS Apply topically as needed for itching. For external hemorrhoids. Also available OTC.        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 Treated with ceftriaxone changed to Zosyn, haziness around the gallbladder on CT ultrasound checked. Also acute on chronic kidney injury. Mild. Troponins elevated not thought to be from MI.  Patient was admitted for further work-up.  Hospital Course:   Sepsis secondary to UTI, POA -Presented with hematuria and dysuria, lactic acidosis, hypotension, AKI -urine culture apparently not sent from ED, UA was positive -No prior urine cultures to compare with.  Urine culture from 1/17 negative, blood cultures negative so far -CT abdomen with concern of possible cholecystitis however abdominal ultrasound negative for acute cholecystitis. -Patient was placed on IV Zosyn, has completed course. Currently no fevers or leukocytosis, hematuria.   Hematuria in the setting of anticoagulation and UTI -Per patient, hematuria cleared -H&H stable  Lactic acidosis Resolved  Acute on  chronic diastolic CHF -2D echo 0000000 had shown EF of 60 to 65% -Follows Dr. Haroldine Laws outpatient, diuretics, Delene Loll had been held secondary to hypotension -Patient was noted to have pitting edema with dyspnea on exertion, orthopnea secondary to fluid overload. Difficult to do aggressive IV diuresis due to soft BP. Beta-blocker was decreased to 25 mg twice daily, then held to allow IV Lasix diuresis. -Patient underwent right heart cath which showed well-controlled volume status with moderate V waves in PCW tracing suggestive of mitral regurgitation versus diastolic dysfunction, high cardiac output. Per cardiology fluid status is now optimized, suspect venous insufficiency playing a role in residual LE edema. Cardiology recommended Bumex 2 mg daily Recommended TED hoses.  Hypertension BP improving   Mild acute on CKD stage IIIa -Baseline creatinine 1.2-1.3, presented with creatinine of 1.9 -Creatinine improved to 1.6, then trended up to 1.9, likely due to hypotension and diuresis.   Persistent atrial fibrillation -Heart rate currently controlled, continue digoxin, beta-blocker -Continue eliquis   Hemorrhoids -Had a small bright red blood on the wipe on 1/20, visualized in the toilet.  Has a history of hemorrhoids.  No melena. -H&H stable, placed on Anusol suppository, rectal cream and Tucks pads  Obesity Estimated body mass index is 39.41 kg/m as calculated from the following:   Height as of this encounter: 5\' 10"  (1.778 m).   Weight as of this encounter: 124.6  kg. Counseled on diet and weight control  Day of Discharge S: *No acute complaints, wants to go home as soon. No acute chest pain or shortness of breath. No fevers  BP (!) 115/47   Pulse 78   Temp 98.4 F (36.9 C) (Oral)   Resp 18   Ht 5\' 10"  (1.778 m)   Wt 125.4 kg   SpO2 100%   BMI 39.66 kg/m   Physical Exam: General: Alert and awake oriented x3 not in any acute distress. HEENT: anicteric sclera,  pupils reactive to light and accommodation CVS: S1-S2 clear no murmur rubs or gallops Chest: clear to auscultation bilaterally, no wheezing rales or rhonchi Abdomen: soft nontender, nondistended, normal bowel sounds Extremities: 1-2+  edema noted bilaterally Neuro: Cranial nerves II-XII intact, no focal neurological deficits    Get Medicines reviewed and adjusted: Please take all your medications with you for your next visit with your Primary MD  Please request your Primary MD to go over all hospital tests and procedure/radiological results at the follow up. Please ask your Primary MD to get all Hospital records sent to his/her office.  If you experience worsening of your admission symptoms, develop shortness of breath, life threatening emergency, suicidal or homicidal thoughts you must seek medical attention immediately by calling 911 or calling your MD immediately  if symptoms less severe.  You must read complete instructions/literature along with all the possible adverse reactions/side effects for all the Medicines you take and that have been prescribed to you. Take any new Medicines after you have completely understood and accept all the possible adverse reactions/side effects.   Do not drive when taking pain medications.   Do not take more than prescribed Pain, Sleep and Anxiety Medications  Special Instructions: If you have smoked or chewed Tobacco  in the last 2 yrs please stop smoking, stop any regular Alcohol  and or any Recreational drug use.  Wear Seat belts while driving.  Please note  You were cared for by a hospitalist during your hospital stay. Once you are discharged, your primary care physician will handle any further medical issues. Please note that NO REFILLS for any discharge medications will be authorized once you are discharged, as it is imperative that you return to your primary care physician (or establish a relationship with a primary care physician if you do not  have one) for your aftercare needs so that they can reassess your need for medications and monitor your lab values.   The results of significant diagnostics from this hospitalization (including imaging, microbiology, ancillary and laboratory) are listed below for reference.      Procedures/Studies:  CT ABDOMEN PELVIS WO CONTRAST  Result Date: 08/31/2020 CLINICAL DATA:  Sepsis due to a grand negative UTI. EXAM: CT ABDOMEN AND PELVIS WITHOUT CONTRAST TECHNIQUE: Multidetector CT imaging of the abdomen and pelvis was performed following the standard protocol without IV contrast. COMPARISON:  October 21, 2017 FINDINGS: Lower chest: The lung bases are clear. The heart size is normal. Hepatobiliary: The liver is normal. There is cholelithiasis. There is mild haziness about the gallbladder.There is no biliary ductal dilation. Pancreas: Normal contours without ductal dilatation. No peripancreatic fluid collection. Spleen: Unremarkable. Adrenals/Urinary Tract: --Adrenal glands: Unremarkable. --Right kidney/ureter: There is a punctate nonobstructing stone. --Left kidney/ureter: No hydronephrosis or radiopaque kidney stones. --Urinary bladder: Unremarkable. Stomach/Bowel: --Stomach/Duodenum: No hiatal hernia or other gastric abnormality. Normal duodenal course and caliber. --Small bowel: Unremarkable. --Colon: There is an above average amount of stool in the colon.  There is liquid stool in the transverse colon. --Appendix: Normal. Vascular/Lymphatic: Atherosclerotic calcification is present within the non-aneurysmal abdominal aorta, without hemodynamically significant stenosis. --No retroperitoneal lymphadenopathy. --No mesenteric lymphadenopathy. --No pelvic or inguinal lymphadenopathy. Reproductive: Brachytherapy beads are noted in the prostate gland. Other: There are fat containing bilateral inguinal hernias. There are stable postsurgical changes in the left inguinal region. Musculoskeletal. No acute displaced  fractures. IMPRESSION: 1. Cholelithiasis with mild haziness about the gallbladder. If there is concern for acute cholecystitis, recommend further evaluation with right upper quadrant ultrasound. 2. Nonobstructive right nephrolithiasis. 3. Large stool burden. Liquid stool in the transverse colon consistent with a possible underlying diarrheal illness. 4. Additional chronic findings as detailed above. Aortic Atherosclerosis (ICD10-I70.0). Electronically Signed   By: Constance Holster M.D.   On: 08/31/2020 00:06   DG Chest 2 View  Result Date: 08/30/2020 CLINICAL DATA:  Chest pain and dyspnea EXAM: CHEST - 2 VIEW COMPARISON:  02/21/2020 chest radiograph. FINDINGS: Stable configuration of 2 lead left subclavian pacemaker. Stable cardiomediastinal silhouette with mild cardiomegaly. No pneumothorax. No pleural effusion. Lungs appear clear, with no acute consolidative airspace disease and no pulmonary edema. IMPRESSION: Stable mild cardiomegaly without pulmonary edema. No active pulmonary disease. Electronically Signed   By: Ilona Sorrel M.D.   On: 08/30/2020 15:00   CARDIAC CATHETERIZATION  Result Date: 09/04/2020 Findings: RA = 3 RV = 38/5 PA = 41/8 (24) PCW = 15 (v = 26) Fick cardiac output/index = 7.6/3.1 Thermo CO/CI = 8.6/3.6 PVR = 1.0 WU FA sat = 97% PA sat = 61%, 62% SVC sat = 61% Assessment: 1. Well controlled volume status with moderate v-waves in PCW tracing suggestive of mitral regurgitation vs diastolic dysfunction 2. High cardiac output with no evidence of intracardiac shunting Plan/Discussion: Fluid status optimized. Suspect venous insufficiency playing a role in residual LE edema. Glori Bickers, MD 10:19 AM  CUP PACEART REMOTE DEVICE CHECK  Result Date: 08/21/2020 Scheduled remote reviewed. Normal device function.  Known AF, on [place], programmed DDI.  There was one NSVT arrhythmia detected that was greater than 10 beats, sent to triage. Next remote 91 days. Kathy Breach, RN, CCDS, CV Remote  [employer]  US Abdomen Limited RUQ (LIVER/GB)  Result Date: 08/31/2020 CLINICAL DATA:  Cholecystitis EXAM: ULTRASOUND ABDOMEN LIMITED RIGHT UPPER QUADRANT COMPARISON:  08/30/2020 CT abdomen/pelvis. FINDINGS: Gallbladder: A few layering shadowing gallstones in the gallbladder, largest 1.2 cm. No definite gallbladder wall thickening (gallbladder wall thickness 2.6 mm). No sonographic Murphy's sign. No pericholecystic fluid. Common bile duct: Diameter: 3 mm Liver: Diffusely echogenic liver parenchyma. No definite liver surface irregularity. No liver mass, noting decreased sensitivity in the setting of an echogenic liver. Portal vein is patent on color Doppler imaging with normal direction of blood flow towards the liver. Other: None. IMPRESSION: 1. Cholelithiasis.  No sonographic evidence of acute cholecystitis. 2. No biliary ductal dilatation. 3. Echogenic liver, a nonspecific finding that can be due to hepatic steatosis and/or fibrosis. Recommend correlation with liver function tests. Consider outpatient hepatic elastography for further liver fibrosis risk stratification, as clinically warranted. Electronically Signed   By: Ilona Sorrel M.D.   On: 08/31/2020 09:25       LAB RESULTS: Basic Metabolic Panel: Recent Labs  Lab 08/30/20 2231 08/31/20 0255 09/03/20 0447 09/04/20 0523 09/04/20 0959 09/04/20 1008 09/05/20 0318  NA 135   < > 140  --    < > 144 141  K 5.3*   < > 3.9  --    < >  3.4* 3.5  CL 112*   < > 114*  --   --   --  113*  CO2 16*   < > 17*  --   --   --  18*  GLUCOSE 98   < > 98  --   --   --  112*  BUN 28*   < > 20  --   --   --  16  CREATININE 1.60*   < > 1.98*  --   --   --  1.90*  CALCIUM 8.0*   < > 8.3*  --   --   --  8.5*  MG  --    < > 1.5* 1.9  --   --   --   PHOS 2.7  --   --   --   --   --   --    < > = values in this interval not displayed.   Liver Function Tests: Recent Labs  Lab 08/30/20 1434 08/30/20 2231 08/31/20 0255  AST 23  --  23  ALT 36  --  36   ALKPHOS 123  --  97  BILITOT 0.8  --  0.8  PROT 5.8*  --  4.8*  ALBUMIN 3.2* 2.7* 2.6*   No results for input(s): LIPASE, AMYLASE in the last 168 hours. No results for input(s): AMMONIA in the last 168 hours. CBC: Recent Labs  Lab 08/31/20 0255 09/02/20 0440 09/04/20 0523 09/04/20 0959 09/04/20 1008 09/05/20 0318  WBC 6.1   < > 8.1  --   --  8.1  NEUTROABS 4.7  --   --   --   --   --   HGB 8.8*   < > 8.9*   < > 9.2* 8.9*  HCT 29.6*   < > 29.7*   < > 27.0* 29.4*  MCV 94.9   < > 94.0  --   --  92.7  PLT 127*   < > 159  --   --  163   < > = values in this interval not displayed.   Cardiac Enzymes: No results for input(s): CKTOTAL, CKMB, CKMBINDEX, TROPONINI in the last 168 hours. BNP: Invalid input(s): POCBNP CBG: No results for input(s): GLUCAP in the last 168 hours.     Disposition and Follow-up: Discharge Instructions    (HEART FAILURE PATIENTS) Call MD:  Anytime you have any of the following symptoms: 1) 3 pound weight gain in 24 hours or 5 pounds in 1 week 2) shortness of breath, with or without a dry hacking cough 3) swelling in the hands, feet or stomach 4) if you have to sleep on extra pillows at night in order to breathe.   Complete by: As directed    Diet - low sodium heart healthy   Complete by: As directed    Discharge instructions   Complete by: As directed    Please resume Entresto if BP remains stable, SBP >100   Increase activity slowly   Complete by: As directed        DISPOSITION: Home   DISCHARGE FOLLOW-UP  Follow-up Information    [name], [name], MD. Go on 10/01/2020.   Specialty: Cardiology Why: @9 :20am Parking Garage Code [id number] Contact information: [address] [place] [address] [phone]        [name], MD. Go on 09/15/2020.   Specialty: Internal Medicine Why: @12 :45pm Contact information: [address] [place] [address] [place] [address]  [phone]                Time coordinating  discharge:  35 minutes  Signed:   Estill Cotta M.D. [facility] 09/05/2020, 11:09 AM

## 2020-09-05 NOTE — Plan of Care (Signed)
  Problem: Education: Goal: Knowledge of General Education information will improve Description: Including pain rating scale, medication(s)/side effects and non-pharmacologic comfort measures Outcome: Adequate for Discharge   Problem: Health Behavior/Discharge Planning: Goal: Ability to manage health-related needs will improve Outcome: Adequate for Discharge   Problem: Clinical Measurements: Goal: Ability to maintain clinical measurements within normal limits will improve Outcome: Adequate for Discharge Goal: Will remain free from infection Outcome: Adequate for Discharge Goal: Diagnostic test results will improve Outcome: Adequate for Discharge Goal: Respiratory complications will improve Outcome: Adequate for Discharge Goal: Cardiovascular complication will be avoided Outcome: Adequate for Discharge   Problem: Activity: Goal: Risk for activity intolerance will decrease Outcome: Adequate for Discharge   Problem: Nutrition: Goal: Adequate nutrition will be maintained Outcome: Adequate for Discharge   Problem: Coping: Goal: Level of anxiety will decrease Outcome: Adequate for Discharge   Problem: Elimination: Goal: Will not experience complications related to bowel motility Outcome: Adequate for Discharge Goal: Will not experience complications related to urinary retention Outcome: Adequate for Discharge   Problem: Pain Managment: Goal: General experience of comfort will improve Outcome: Adequate for Discharge   Problem: Safety: Goal: Ability to remain free from injury will improve Outcome: Adequate for Discharge   Problem: Skin Integrity: Goal: Risk for impaired skin integrity will decrease Outcome: Adequate for Discharge   Problem: Fluid Volume: Goal: Hemodynamic stability will improve Outcome: Adequate for Discharge   Problem: Clinical Measurements: Goal: Diagnostic test results will improve Outcome: Adequate for Discharge Goal: Signs and symptoms of  infection will decrease Outcome: Adequate for Discharge   Problem: Respiratory: Goal: Ability to maintain adequate ventilation will improve Outcome: Adequate for Discharge   Problem: Education: Goal: Ability to demonstrate management of disease process will improve Outcome: Adequate for Discharge Goal: Ability to verbalize understanding of medication therapies will improve Outcome: Adequate for Discharge Goal: Individualized Educational Video(s) Outcome: Adequate for Discharge   Problem: Activity: Goal: Capacity to carry out activities will improve Outcome: Adequate for Discharge   Problem: Cardiac: Goal: Ability to achieve and maintain adequate cardiopulmonary perfusion will improve Outcome: Adequate for Discharge   Problem: Education: Goal: Understanding of CV disease, CV risk reduction, and recovery process will improve Outcome: Adequate for Discharge Goal: Individualized Educational Video(s) Outcome: Adequate for Discharge   Problem: Activity: Goal: Ability to return to baseline activity level will improve Outcome: Adequate for Discharge   Problem: Cardiovascular: Goal: Ability to achieve and maintain adequate cardiovascular perfusion will improve Outcome: Adequate for Discharge Goal: Vascular access site(s) Level 0-1 will be maintained Outcome: Adequate for Discharge   Problem: Health Behavior/Discharge Planning: Goal: Ability to safely manage health-related needs after discharge will improve Outcome: Adequate for Discharge

## 2020-09-05 NOTE — Progress Notes (Signed)
D/C instructions given and reviewed. No questions asked but encouraged to call with any concerns. Tele removed, tolerated well. Waiting on IV antibiotic administration.

## 2020-09-05 NOTE — Plan of Care (Signed)
  Problem: Education: Goal: Knowledge of General Education information will improve Description Including pain rating scale, medication(s)/side effects and non-pharmacologic comfort measures Outcome: Progressing   

## 2020-09-09 ENCOUNTER — Other Ambulatory Visit: Payer: Self-pay | Admitting: Internal Medicine

## 2020-09-09 ENCOUNTER — Ambulatory Visit: Payer: Medicare Other | Admitting: Cardiovascular Disease

## 2020-09-09 DIAGNOSIS — D510 Vitamin B12 deficiency anemia due to intrinsic factor deficiency: Secondary | ICD-10-CM | POA: Diagnosis not present

## 2020-09-09 DIAGNOSIS — J45909 Unspecified asthma, uncomplicated: Secondary | ICD-10-CM | POA: Diagnosis not present

## 2020-09-09 DIAGNOSIS — M179 Osteoarthritis of knee, unspecified: Secondary | ICD-10-CM | POA: Diagnosis not present

## 2020-09-09 DIAGNOSIS — I5030 Unspecified diastolic (congestive) heart failure: Secondary | ICD-10-CM | POA: Diagnosis not present

## 2020-09-09 DIAGNOSIS — I480 Paroxysmal atrial fibrillation: Secondary | ICD-10-CM | POA: Diagnosis not present

## 2020-09-09 DIAGNOSIS — I482 Chronic atrial fibrillation, unspecified: Secondary | ICD-10-CM | POA: Diagnosis not present

## 2020-09-09 DIAGNOSIS — I10 Essential (primary) hypertension: Secondary | ICD-10-CM | POA: Diagnosis not present

## 2020-09-09 DIAGNOSIS — M1612 Unilateral primary osteoarthritis, left hip: Secondary | ICD-10-CM | POA: Diagnosis not present

## 2020-09-09 NOTE — Telephone Encounter (Signed)
This is Dr. Keya Paha's pt.  °

## 2020-09-11 ENCOUNTER — Other Ambulatory Visit (HOSPITAL_COMMUNITY): Payer: Self-pay | Admitting: Internal Medicine

## 2020-09-11 ENCOUNTER — Telehealth (HOSPITAL_COMMUNITY): Payer: Self-pay | Admitting: Pharmacist

## 2020-09-11 ENCOUNTER — Other Ambulatory Visit: Payer: Self-pay | Admitting: Cardiovascular Disease

## 2020-09-11 NOTE — Telephone Encounter (Signed)
Patient Advocate Encounter   Received notification from [facility] that prior authorization for Eliquis is required.   PA submitted on CoverMyMeds Key [id number] Status is pending   Will continue to follow.   Audry Riles, PharmD, BCPS, BCCP, CPP Heart Failure Clinic Pharmacist [phone]

## 2020-09-12 NOTE — Telephone Encounter (Signed)
Advanced Heart Failure Patient Advocate Encounter  Prior Authorization for Eliquis has been approved.     Effective dates: 09/11/20 through 09/11/21  Audry Riles, PharmD, BCPS, BCCP, CPP Heart Failure Clinic Pharmacist [phone]

## 2020-09-15 DIAGNOSIS — N390 Urinary tract infection, site not specified: Secondary | ICD-10-CM | POA: Diagnosis not present

## 2020-09-15 DIAGNOSIS — I5030 Unspecified diastolic (congestive) heart failure: Secondary | ICD-10-CM | POA: Diagnosis not present

## 2020-09-15 IMAGING — DX DG CHEST 2V
2 series · 2 of 2 positions shown · non-contrast
Comparison: Chest x-ray 11/01/2018.

CLINICAL DATA: 78-year-old male with history of dyspnea.

EXAM:
CHEST - 2 VIEW

[chest pa]
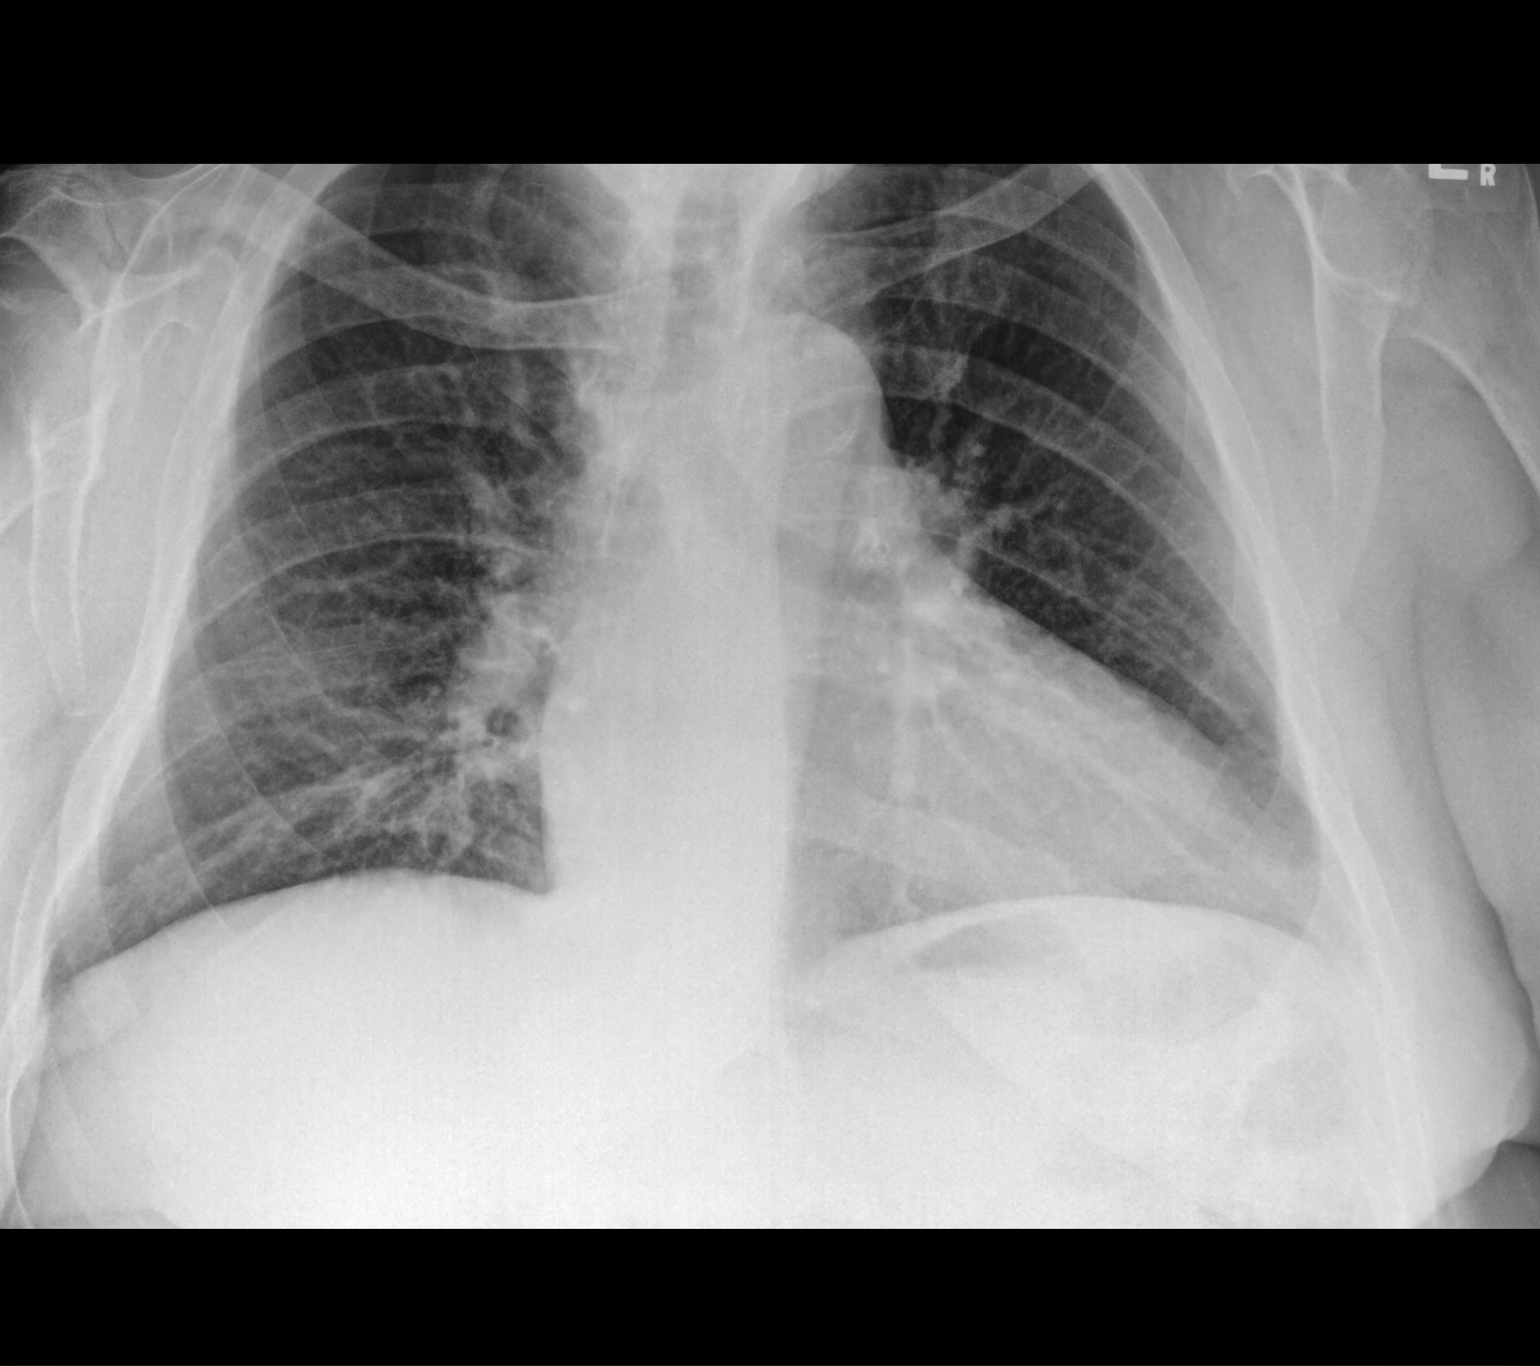

[chest lat]
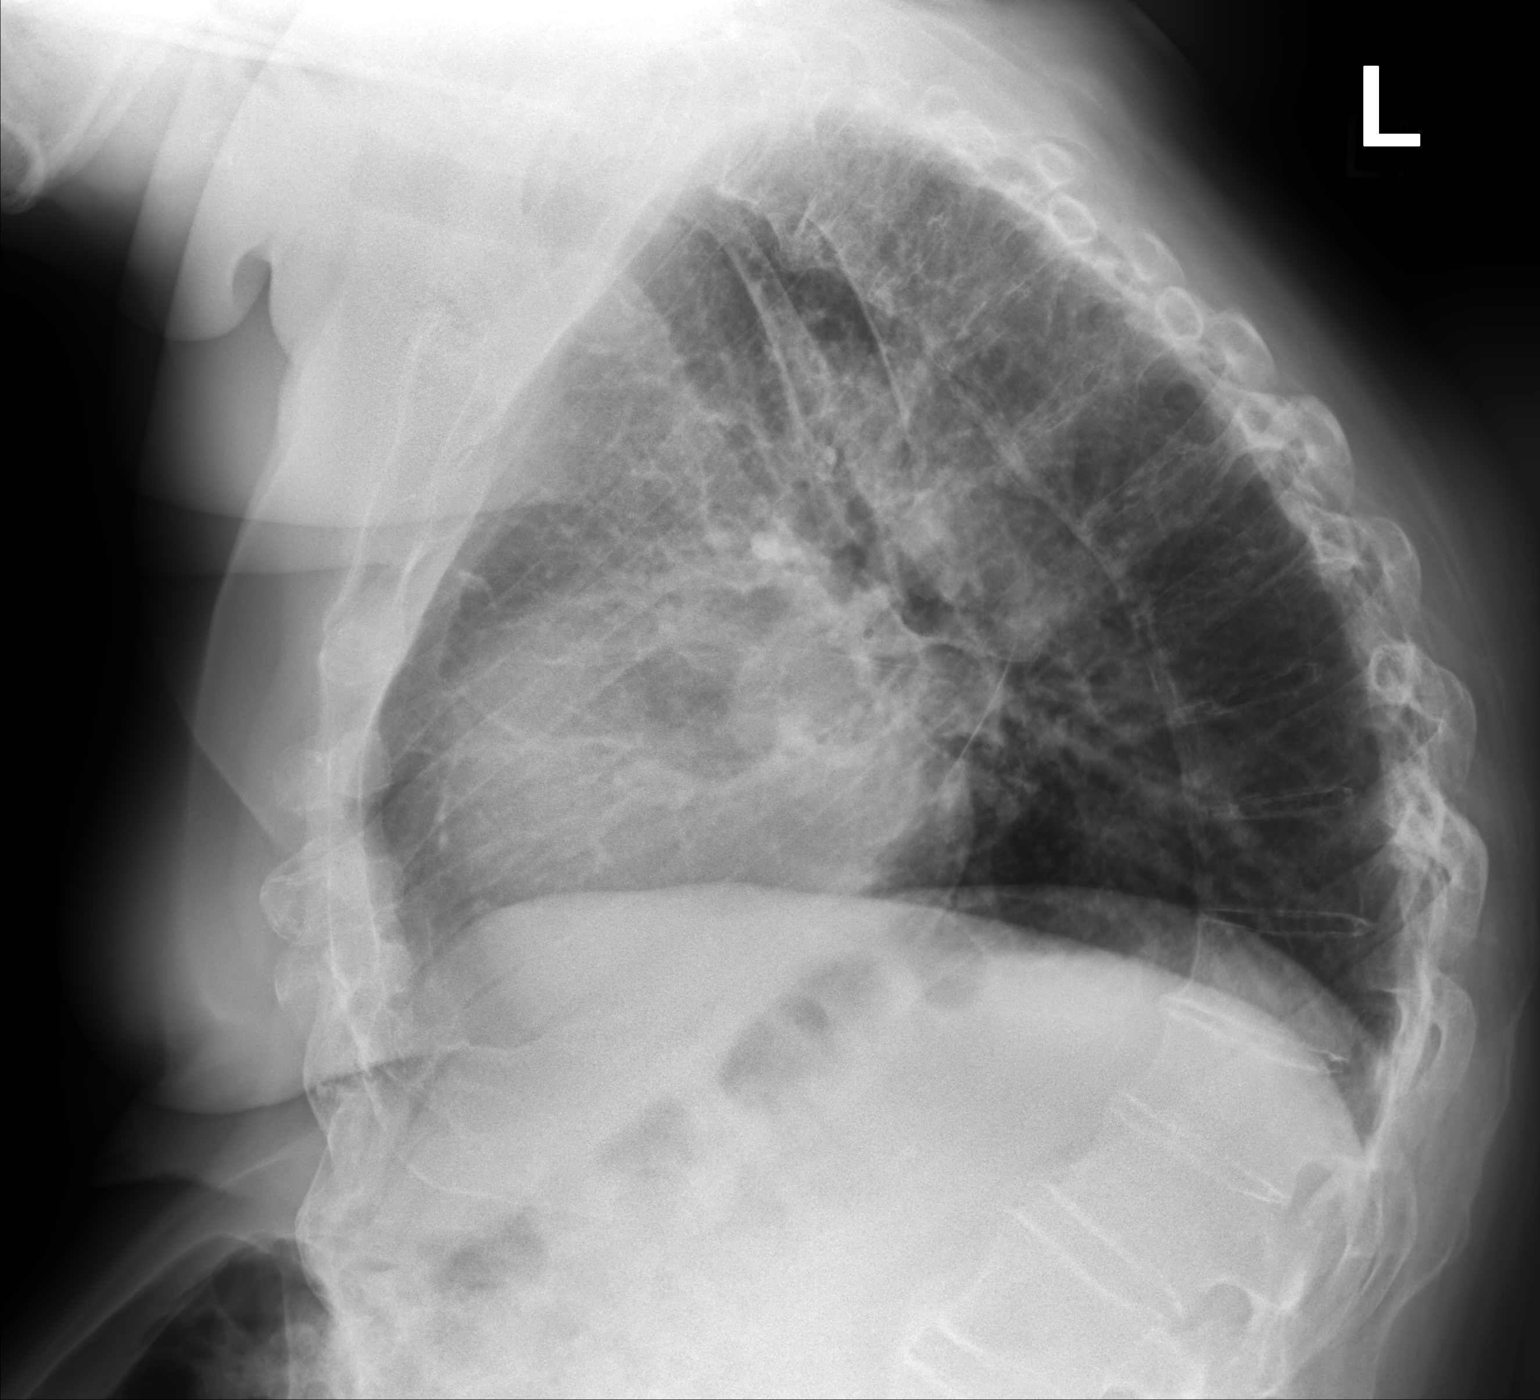

[2 of 2 positions shown; findings below may reference images not displayed]

FINDINGS: Lung volumes are normal. No consolidative airspace disease. No
pleural effusions. No pneumothorax. No pulmonary nodule or mass
noted. Pulmonary vasculature and the cardiomediastinal silhouette
are within normal limits. Atherosclerosis in the thoracic aorta.
IMPRESSION: 1.  No radiographic evidence of acute cardiopulmonary disease.
2. Aortic atherosclerosis.

## 2020-09-17 ENCOUNTER — Telehealth (HOSPITAL_COMMUNITY): Payer: Self-pay | Admitting: *Deleted

## 2020-09-17 NOTE — Telephone Encounter (Addendum)
Amber with [facility] heart failure program left VM requesting bp parameters and when she should notify the office if pts bp is too low. Amber spoke with pt this AM and pts bp was 97/45 but patient was asymptomatic. BP reading a couple of hours after taking medications. Amber also asks if we would like to make any medication changes.  [place] [address]  Routed to [place]

## 2020-09-17 NOTE — Telephone Encounter (Signed)
Let us know if SBP < 90

## 2020-09-17 NOTE — Telephone Encounter (Signed)
Left detailed VM on confidential VM for Amber,RN w/[employer].

## 2020-09-18 ENCOUNTER — Other Ambulatory Visit: Payer: Self-pay | Admitting: Internal Medicine

## 2020-09-18 NOTE — Telephone Encounter (Signed)
This is Dr. Bailey's pt.  °

## 2020-09-21 ENCOUNTER — Encounter (HOSPITAL_COMMUNITY): Payer: Self-pay

## 2020-09-22 ENCOUNTER — Other Ambulatory Visit (HOSPITAL_COMMUNITY): Payer: Self-pay | Admitting: *Deleted

## 2020-09-22 ENCOUNTER — Encounter (HOSPITAL_COMMUNITY): Payer: Self-pay

## 2020-09-22 MED ORDER — METOPROLOL TARTRATE 50 MG PO TABS: 25.0000 mg | ORAL_TABLET | Freq: Two times a day (BID) | ORAL | 0 refills | Status: DC

## 2020-09-22 NOTE — Telephone Encounter (Signed)
What meds is he taking.   If still on Entresto would stop entresto and cut metoprolol in half.

## 2020-09-30 NOTE — Progress Notes (Signed)
ADVANCED HF CLINIC NOTE  Referring Physician: Dr. Oval Linsey Primary Care: [name], MD Primary Cardiologist: [name], MD  HPI:  Mr. Demeyer is a 80 y/o male with morbid obesity s/p gastric bypass 1980s, permanent AF s/p AVN ablation in 5/20 (unable to get CHB) and CRT, HTN, OSA on CPAP, prostate CA and diastolic HF. Referred by Dr. Oval Linsey for further evaluation of his HF and ongoing dyspnea.   Cath 2004 normal coronaries. Myoview 9/19. EF 60% no scar/ischemia  Saw Dr. Lovena Le 10/21 - device interrogation showed only 8% pacing. However 90% of HRs < 100bpm.   PFTs 08/20/16 revealed severe obstruction and likely moderate restriction.  There is also moderate diffusion defect.  He saw Dr. Lamonte Sakai and underwent bronchoscopy on 09/21/16 which showed no intrathoracic obstruction or tracheal abnormality.  Most recent echo 04/2020 revealed EF 60 to 65% with severe LVH.  The septum was 2.5 cm in the posterior wall 2.1 cm.  PYP 10/21 negative  He was admitted in 1/21 with urosepsis and LE edema. Sent home on Bumex 2mg  daily.  D/c weight 125.4 kg. Feeling much better. Feels fluid is much improved   RHC 09/04/20  RA = 3 RV = 38/5 PA = 41/8 (24) PCW = 15 (v = 26) Fick cardiac output/index = 7.6/3.1 Thermo CO/CI = 8.6/3.6 PVR = 1.0 WU FA sat = 97% PA sat = 61%, 62% SVC sat = 61%  Here for f/u. Since d/c has called the office several times with low BP. Entresto stopped and metoprolol cut back. Feels fluid is much better. Taking bumex 2mg  daily. Still SOB with mild exertion. No CP.   HR rates typically 70-75 on his AppleWatch. Hard to walk with left leg weakness. Lives at [place].    Past Medical History:  Diagnosis Date  . Anemia 1980s X 1  . Arthritis    "hands, knees, hips, ankles" (07/31/2015)  . Atrial flutter ([place]) 07/31/2015  . CHF (congestive heart failure) ([place])   . Chronic diastolic heart failure ([place])    a. 11/2015: Echo w/ EF of 60-65%, no  WMA, Grade 2 DD, trivial AR, ascending aorta mildly dilated.   . Depression   . History of cardiovascular stress test    a. 03/2015: Dobutamine Stress Echo with no evidence of ischemia.   [name] History of gastric bypass   . History of peptic ulcer    1980's  . History of radiation therapy 1993   sarcoma of left groin, tx at [facility]  . History of sarcoma    1993  LEFT GOIN--  S/P SURGERY, RADIATION AND CHEMO IN [place]  . Hypertension   . Hypogonadism in male   . Incomplete right bundle branch block   . 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 Nocturia   . OSA on CPAP   . Persistent atrial fibrillation ([place])       . 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 Primary prostate adenocarcinoma ([place]) DX 07/08/14   Gleason 7,  stage T1c  . PVC's (premature ventricular contractions) 11/21/2015  . 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 Weakness of left leg    SECONDARY TO SURGICAL NERVE INJURY OF LEFT GOIN  . Wears glasses     Current Outpatient Medications  Medication Sig Dispense Refill  . acetaminophen (TYLENOL) 500 MG tablet Take 1,000 mg by  mouth every 6 (six) hours as needed for headache (pain).    [name] amoxicillin (AMOXIL) 500 MG capsule Take 2,000 mg by mouth See admin instructions. Take 4 capsules (2000 mg) by mouth one hour prior to dental appointments    . apixaban (ELIQUIS) 5 MG TABS tablet Take 1 tablet (5 mg total) by mouth 2 (two) times daily. Resume with Sunday evening dose. 180 tablet 1  . bumetanide (BUMEX) 2 MG tablet Take 1 tablet (2 mg total) by mouth daily. 30 tablet 4  . buPROPion (WELLBUTRIN XL) 300 MG 24 hr tablet Take 300 mg by mouth daily.     . Cholecalciferol (VITAMIN D3) 125 MCG (5000 UT) TABS Take 5,000 Units by mouth daily.     . clotrimazole (LOTRIMIN) 1 % cream Apply 1 application topically every 3 (three) days.    . cyanocobalamin (,VITAMIN B-12,) 1000 MCG/ML  injection Inject 1,000 mcg into the muscle every 30 (thirty) days.    [name] escitalopram (LEXAPRO) 10 MG tablet Take 10 mg by mouth daily.    [name] JARDIANCE 10 MG TABS tablet TAKE ONE TABLET BY MOUTH DAILY 86 tablet 4  . magnesium oxide (MAG-OX) 400 MG tablet TAKE 2 TABLETS EACH MORNING AND 1 TABLETEACH EVENING. 270 tablet 3  . metoprolol tartrate (LOPRESSOR) 50 MG tablet Take 0.5 tablets (25 mg total) by mouth 2 (two) times daily. 180 tablet 0  . mirabegron ER (MYRBETRIQ) 50 MG TB24 tablet Take 50 mg by mouth daily.    . Multiple Vitamin (MULTIVITAMIN WITH MINERALS) TABS tablet Take 1 tablet by mouth daily.    . Multiple Vitamins-Minerals (PRESERVISION AREDS 2 PO) Take 1 capsule by mouth 2 (two) times a day.    [name] Glycol-Propyl Glycol (SYSTANE ULTRA) 0.4-0.3 % SOLN Place 1 drop into both eyes daily as needed (for dry eyes).     . potassium chloride SA (KLOR-CON) 20 MEQ tablet Take 1 tablet (20 mEq total) by mouth daily.    . tamsulosin (FLOMAX) 0.4 MG CAPS capsule Take 0.8 mg by mouth at bedtime.     [name] witch hazel-glycerin (TUCKS) pad Apply topically as needed for itching. For external hemorrhoids. Also available OTC. 40 each 12   No current facility-administered medications for this encounter.    Allergies  Allergen Reactions  . Ace Inhibitors Cough  . Xarelto [Rivaroxaban] Other (See Comments)    Joint pain      Social History   Socioeconomic History  . Marital status: Married    Spouse name: Not on file  . Number of children: Not on file  . Years of education: Not on file  . Highest education level: Not on file  Occupational History  . Not on file  Tobacco Use  . Smoking status: Never Smoker  . Smokeless tobacco: Never Used  Vaping Use  . Vaping Use: Never used  Substance and Sexual Activity  . Alcohol use: No  . Drug use: No  . Sexual activity: Not Currently  Other Topics Concern  . 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 Liver disease Mother   . Renal Disease Mother   . Stroke Mother   . Atrial fibrillation Father   . Cancer Sister     Vitals:   10/01/20 0931  BP: 110/60  Pulse: 75  SpO2: 97%  Weight: 126 kg (277 lb 12.8 oz)   Wt Readings from Last 3 Encounters:  10/01/20 126 kg (277 lb 12.8 oz)  09/05/20 125.4 kg (276 lb 6.4 oz)  07/14/20 127.3 kg (280 lb 9.6 oz)     PHYSICAL EXAM: General:  Well appearing. No resp difficulty HEENT: normal Neck: supple. no JVD. Carotids 2+ bilat; no bruits. No lymphadenopathy or thryomegaly appreciated. Cor: PMI nondisplaced. Irregular rate & rhythm. No rubs, gallops or murmurs. Lungs: clear Abdomen: soft, nontender, nondistended. No hepatosplenomegaly. No bruits or masses. Good bowel sounds. Extremities: no cyanosis, clubbing, rash, edema Neuro: alert & orientedx3, cranial nerves grossly intact. moves all 4 extremities w/o difficulty. Affect pleasant   ASSESSMENT & PLAN:  1. Chronic dyspnea - suspect this is multifactorial (HF, AF, obesity, deconditioning, restrictive lung physiology) - currently volume status looks quite good. Suspect mostly due to his weight and deconditioning at this point.  - [place] 1/22 confirmed relatively normal filling pressures but did have elevated v-waves in PCWP tracing. Could consider exercise testing with invasive monitoring - will try structured exercise program for 4-8 weeks with 2 exercise 10-23mins each for 4day/week with some weights and stretching. If no better will do CPX +/- invasive exercise testing  2. Chronic diastolic HF - Echo 1/02 EF 60  to 65% with severe LVH.  The septum was 2.5 cm in the posterior wall 2.1 cm.RV ok - PYP 10/21 negative - Volume status looks good on current regimen - RHC 1/22 confirmed relatively normal filling pressures but did have elevated v-waves in PCWP tracing.  - On Bumex 2mg  daily - Continue Jardiance 10 - Continue metoprolol 25 bid - Entresto stopped due to low BP- Can consider Cardiomems as needed if we continue to struggle with fluid balance - Labs today  3. Permanent AF -  permanent AF s/p AVN ablation in 5/20 (unable to get CHB) and CRT, - followed by Dr. Lovena Le - rates improved on digoxin and metoprolol - On Eliquis. No bleeding  4. OSA on CPAP - compliant with CPAP  5. Restrictive lung disease - PFTs 5/21: FEV1 1.66 (59%) FVC 2.28 (59%) DLCO 69%  5. Morbid obesity - s/p gastric bypass - Body mass index is 39.86 kg/m.  - would likely benefit significantly from weight loss  Glori Bickers, MD  10:04 AM

## 2020-10-01 ENCOUNTER — Ambulatory Visit (HOSPITAL_COMMUNITY)
Admission: RE | Admit: 2020-10-01 | Discharge: 2020-10-01 | Disposition: A | Discharge status: Home / Self Care | Payer: Medicare Other | Source: Ambulatory Visit | Attending: Internal Medicine | Admitting: Internal Medicine

## 2020-10-01 ENCOUNTER — Encounter (HOSPITAL_COMMUNITY): Payer: Self-pay | Admitting: Internal Medicine

## 2020-10-01 VITALS — BP 110/60 | HR 75 | Wt 277.8 lb

## 2020-10-01 DIAGNOSIS — Z7901 Long term (current) use of anticoagulants: Secondary | ICD-10-CM | POA: Insufficient documentation

## 2020-10-01 DIAGNOSIS — J984 Other disorders of lung: Secondary | ICD-10-CM | POA: Insufficient documentation

## 2020-10-01 DIAGNOSIS — Z8249 Family history of ischemic heart disease and other diseases of the circulatory system: Secondary | ICD-10-CM | POA: Diagnosis not present

## 2020-10-01 DIAGNOSIS — Z888 Allergy status to other drugs, medicaments and biological substances status: Secondary | ICD-10-CM | POA: Diagnosis not present

## 2020-10-01 DIAGNOSIS — I110 Hypertensive heart disease with heart failure: Secondary | ICD-10-CM | POA: Insufficient documentation

## 2020-10-01 DIAGNOSIS — Z8546 Personal history of malignant neoplasm of prostate: Secondary | ICD-10-CM | POA: Insufficient documentation

## 2020-10-01 DIAGNOSIS — Z950 Presence of cardiac pacemaker: Secondary | ICD-10-CM | POA: Diagnosis not present

## 2020-10-01 DIAGNOSIS — Z79899 Other long term (current) drug therapy: Secondary | ICD-10-CM | POA: Insufficient documentation

## 2020-10-01 DIAGNOSIS — I5032 Chronic diastolic (congestive) heart failure: Secondary | ICD-10-CM | POA: Insufficient documentation

## 2020-10-01 DIAGNOSIS — I4821 Permanent atrial fibrillation: Secondary | ICD-10-CM | POA: Diagnosis not present

## 2020-10-01 DIAGNOSIS — R531 Weakness: Secondary | ICD-10-CM | POA: Insufficient documentation

## 2020-10-01 DIAGNOSIS — I4819 Other persistent atrial fibrillation: Secondary | ICD-10-CM | POA: Diagnosis not present

## 2020-10-01 DIAGNOSIS — Z9884 Bariatric surgery status: Secondary | ICD-10-CM | POA: Insufficient documentation

## 2020-10-01 DIAGNOSIS — R0609 Other forms of dyspnea: Secondary | ICD-10-CM | POA: Diagnosis not present

## 2020-10-01 DIAGNOSIS — G4733 Obstructive sleep apnea (adult) (pediatric): Secondary | ICD-10-CM | POA: Insufficient documentation

## 2020-10-01 DIAGNOSIS — Z6839 Body mass index (BMI) 39.0-39.9, adult: Secondary | ICD-10-CM | POA: Insufficient documentation

## 2020-10-01 LAB — BASIC METABOLIC PANEL
Anion gap: 8 (ref 5–15)
BUN: 39 mg/dL — ABNORMAL HIGH (ref 8–23)
CO2: 25 mmol/L (ref 22–32)
Calcium: 8.9 mg/dL (ref 8.9–10.3)
Chloride: 107 mmol/L (ref 98–111)
Creatinine, Ser: 1.52 mg/dL — ABNORMAL HIGH (ref 0.61–1.24)
GFR, Estimated: 47 mL/min — ABNORMAL LOW (ref >60)
Glucose, Bld: 113 mg/dL — ABNORMAL HIGH (ref 70–99)
Potassium: 4.8 mmol/L (ref 3.5–5.1)
Sodium: 140 mmol/L (ref 135–145)

## 2020-10-01 LAB — BRAIN NATRIURETIC PEPTIDE: B Natriuretic Peptide: 456.8 pg/mL — ABNORMAL HIGH (ref 0.0–100.0)

## 2020-10-01 NOTE — Patient Instructions (Signed)
Labs done today, your results will be available in MyChart, we will contact you for abnormal readings.  Your physician recommends that you schedule a follow-up appointment in: 2 months  If you have any questions or concerns before your next appointment please send us a message through mychart or call our office at [phone].    TO LEAVE A MESSAGE FOR THE NURSE SELECT OPTION 2, PLEASE LEAVE A MESSAGE INCLUDING: . YOUR NAME . DATE OF BIRTH . CALL BACK NUMBER . REASON FOR CALL**this is important as we prioritize the call backs  YOU WILL RECEIVE A CALL BACK THE SAME DAY AS LONG AS YOU CALL BEFORE 4:00 PM  

## 2020-10-01 NOTE — Addendum Note (Signed)
Encounter addended by: Malena Edman, RN on: 10/01/2020 10:11 AM  Actions taken: Visit diagnoses modified, Order list changed, Diagnosis association updated, Charge Capture section accepted, Clinical Note Signed

## 2020-10-02 ENCOUNTER — Encounter (HOSPITAL_COMMUNITY): Payer: Self-pay

## 2020-10-09 DIAGNOSIS — M1612 Unilateral primary osteoarthritis, left hip: Secondary | ICD-10-CM | POA: Diagnosis not present

## 2020-10-09 DIAGNOSIS — I5030 Unspecified diastolic (congestive) heart failure: Secondary | ICD-10-CM | POA: Diagnosis not present

## 2020-10-09 DIAGNOSIS — I10 Essential (primary) hypertension: Secondary | ICD-10-CM | POA: Diagnosis not present

## 2020-10-09 DIAGNOSIS — J45909 Unspecified asthma, uncomplicated: Secondary | ICD-10-CM | POA: Diagnosis not present

## 2020-10-09 DIAGNOSIS — M179 Osteoarthritis of knee, unspecified: Secondary | ICD-10-CM | POA: Diagnosis not present

## 2020-10-09 DIAGNOSIS — M459 Ankylosing spondylitis of unspecified sites in spine: Secondary | ICD-10-CM | POA: Diagnosis not present

## 2020-10-09 DIAGNOSIS — I480 Paroxysmal atrial fibrillation: Secondary | ICD-10-CM | POA: Diagnosis not present

## 2020-10-13 ENCOUNTER — Encounter (HOSPITAL_COMMUNITY): Payer: Medicare Other | Admitting: Internal Medicine

## 2020-10-16 ENCOUNTER — Encounter (HOSPITAL_COMMUNITY): Payer: Self-pay

## 2020-10-16 NOTE — Telephone Encounter (Signed)
Agree can do it for 2 days

## 2020-11-05 DIAGNOSIS — I10 Essential (primary) hypertension: Secondary | ICD-10-CM | POA: Diagnosis not present

## 2020-11-05 DIAGNOSIS — J45909 Unspecified asthma, uncomplicated: Secondary | ICD-10-CM | POA: Diagnosis not present

## 2020-11-05 DIAGNOSIS — I480 Paroxysmal atrial fibrillation: Secondary | ICD-10-CM | POA: Diagnosis not present

## 2020-11-05 DIAGNOSIS — D510 Vitamin B12 deficiency anemia due to intrinsic factor deficiency: Secondary | ICD-10-CM | POA: Diagnosis not present

## 2020-11-05 DIAGNOSIS — I5030 Unspecified diastolic (congestive) heart failure: Secondary | ICD-10-CM | POA: Diagnosis not present

## 2020-11-06 ENCOUNTER — Encounter (HOSPITAL_COMMUNITY): Payer: Self-pay

## 2020-11-06 ENCOUNTER — Telehealth (HOSPITAL_COMMUNITY): Payer: Self-pay | Admitting: *Deleted

## 2020-11-06 NOTE — Telephone Encounter (Signed)
Pt contacted and nurse visit scheduled.   [name], Eric Pascal, MD  You 28 minutes ago (1:58 PM)     Can you bring him in for ECG and labs (BMET, CBC, TSH)   Message text    You routed conversation to [name], Eric Pascal, MD 1 hour ago (1:24 PM)   [name] "Dow"  [name], Eric Pascal, MD 5 hours ago (9:21 AM)      I'm a little concerned about my pulse. I have an app that monitors my heart. And it seems to be indicating that my pulse is getting much faster. On Thursday, March 10 my resting rate was 72 BPM an average for the day was resting rate was 98% high, resting was only 2% and there was no elevated BPM. On Thursday, March 17 resting rate was 81%, higher resting 10%, elevated 9%. Today my elevated is 55%, higher resting is 23% and resting is 23% with my average at 96 BPM; this has gone on since this last Saturday when my average bpm went from 71 average on Friday to 86 on Saturday to 96 today, Thursday. I don't know whether I should be concerned. Please advise.

## 2020-11-07 ENCOUNTER — Other Ambulatory Visit: Payer: Self-pay

## 2020-11-07 ENCOUNTER — Encounter (HOSPITAL_COMMUNITY): Payer: Self-pay

## 2020-11-07 ENCOUNTER — Ambulatory Visit (HOSPITAL_COMMUNITY)
Admission: RE | Admit: 2020-11-07 | Discharge: 2020-11-07 | Disposition: A | Discharge status: Home / Self Care | Payer: Medicare Other | Source: Ambulatory Visit | Attending: Internal Medicine | Admitting: Internal Medicine

## 2020-11-07 VITALS — BP 127/62 | HR 109

## 2020-11-07 DIAGNOSIS — I5032 Chronic diastolic (congestive) heart failure: Secondary | ICD-10-CM | POA: Diagnosis not present

## 2020-11-07 DIAGNOSIS — I4891 Unspecified atrial fibrillation: Secondary | ICD-10-CM

## 2020-11-07 LAB — BASIC METABOLIC PANEL
Anion gap: 6 (ref 5–15)
BUN: 36 mg/dL — ABNORMAL HIGH (ref 8–23)
CO2: 28 mmol/L (ref 22–32)
Calcium: 8.9 mg/dL (ref 8.9–10.3)
Chloride: 108 mmol/L (ref 98–111)
Creatinine, Ser: 1.4 mg/dL — ABNORMAL HIGH (ref 0.61–1.24)
GFR, Estimated: 52 mL/min — ABNORMAL LOW (ref >60)
Glucose, Bld: 110 mg/dL — ABNORMAL HIGH (ref 70–99)
Potassium: 3.7 mmol/L (ref 3.5–5.1)
Sodium: 142 mmol/L (ref 135–145)

## 2020-11-07 LAB — CBC
HCT: 30.2 % — ABNORMAL LOW (ref 39.0–52.0)
Hemoglobin: 8.3 g/dL — ABNORMAL LOW (ref 13.0–17.0)
MCH: 23.2 pg — ABNORMAL LOW (ref 26.0–34.0)
MCHC: 27.5 g/dL — ABNORMAL LOW (ref 30.0–36.0)
MCV: 84.6 fL (ref 80.0–100.0)
Platelets: 189 10*3/uL (ref 150–400)
RBC: 3.57 MIL/uL — ABNORMAL LOW (ref 4.22–5.81)
RDW: 15.9 % — ABNORMAL HIGH (ref 11.5–15.5)
WBC: 7.1 10*3/uL (ref 4.0–10.5)
nRBC: 0 % (ref 0.0–0.2)

## 2020-11-07 LAB — TSH: TSH: 2.664 u[IU]/mL (ref 0.350–4.500)

## 2020-11-07 MED ORDER — METOPROLOL TARTRATE 50 MG PO TABS: 50.0000 mg | ORAL_TABLET | Freq: Two times a day (BID) | ORAL | 0 refills | Status: DC

## 2020-11-07 NOTE — Progress Notes (Signed)
Patient seen in clinic for nurse visit today for ekg and lab work. Patient tracks his heart rate through an app on his phone and noticed that it has gradually increased over the past few weeks. Patients average heart rate now in the 90's. Per Adline Potter have patient increase his metoprolol to 50mg  bid patient is currently taking 25mg  bid and make sure patient has a follow up with Dr.Taylor. Pt aware and verbalized understanding.

## 2020-11-13 DIAGNOSIS — L821 Other seborrheic keratosis: Secondary | ICD-10-CM | POA: Diagnosis not present

## 2020-11-13 DIAGNOSIS — D1801 Hemangioma of skin and subcutaneous tissue: Secondary | ICD-10-CM | POA: Diagnosis not present

## 2020-11-13 DIAGNOSIS — L570 Actinic keratosis: Secondary | ICD-10-CM | POA: Diagnosis not present

## 2020-11-13 DIAGNOSIS — D2371 Other benign neoplasm of skin of right lower limb, including hip: Secondary | ICD-10-CM | POA: Diagnosis not present

## 2020-11-13 DIAGNOSIS — Z85828 Personal history of other malignant neoplasm of skin: Secondary | ICD-10-CM | POA: Diagnosis not present

## 2020-11-13 DIAGNOSIS — L814 Other melanin hyperpigmentation: Secondary | ICD-10-CM | POA: Diagnosis not present

## 2020-11-13 DIAGNOSIS — D225 Melanocytic nevi of trunk: Secondary | ICD-10-CM | POA: Diagnosis not present

## 2020-11-20 ENCOUNTER — Ambulatory Visit (INDEPENDENT_AMBULATORY_CARE_PROVIDER_SITE_OTHER): Payer: Medicare Other

## 2020-11-20 DIAGNOSIS — I442 Atrioventricular block, complete: Secondary | ICD-10-CM

## 2020-11-20 LAB — CUP PACEART REMOTE DEVICE CHECK
Battery Remaining Longevity: 170 mo
Battery Voltage: 3.16 V
Brady Statistic AP VP Percent: 18.53 %
Brady Statistic AP VS Percent: 0.35 %
Brady Statistic AS VP Percent: 5.97 %
Brady Statistic AS VS Percent: 75.18 %
Brady Statistic RA Percent Paced: 17.85 %
Brady Statistic RV Percent Paced: 22.86 %
Date Time Interrogation Session: 20220407055526
Implantable Lead Implant Date: 20210707
Implantable Lead Implant Date: 20210707
Implantable Lead Location: 753859
Implantable Lead Location: 753860
Implantable Lead Model: 3830
Implantable Lead Model: 5076
Implantable Pulse Generator Implant Date: 20210707
Lead Channel Impedance Value: 304 Ohm
Lead Channel Impedance Value: 380 Ohm
Lead Channel Impedance Value: 418 Ohm
Lead Channel Impedance Value: 570 Ohm
Lead Channel Pacing Threshold Amplitude: 0.75 V
Lead Channel Pacing Threshold Pulse Width: 0.4 ms
Lead Channel Sensing Intrinsic Amplitude: 1.625 mV
Lead Channel Sensing Intrinsic Amplitude: 1.625 mV
Lead Channel Sensing Intrinsic Amplitude: 24.75 mV
Lead Channel Sensing Intrinsic Amplitude: 24.75 mV
Lead Channel Setting Pacing Amplitude: 2 V
Lead Channel Setting Pacing Amplitude: 2 V
Lead Channel Setting Pacing Pulse Width: 0.4 ms
Lead Channel Setting Sensing Sensitivity: 1.2 mV

## 2020-11-26 ENCOUNTER — Other Ambulatory Visit: Payer: Self-pay | Admitting: Internal Medicine

## 2020-11-26 DIAGNOSIS — I10 Essential (primary) hypertension: Secondary | ICD-10-CM | POA: Diagnosis not present

## 2020-11-26 DIAGNOSIS — J45909 Unspecified asthma, uncomplicated: Secondary | ICD-10-CM | POA: Diagnosis not present

## 2020-11-26 DIAGNOSIS — I5030 Unspecified diastolic (congestive) heart failure: Secondary | ICD-10-CM | POA: Diagnosis not present

## 2020-11-26 DIAGNOSIS — I480 Paroxysmal atrial fibrillation: Secondary | ICD-10-CM | POA: Diagnosis not present

## 2020-11-26 DIAGNOSIS — M459 Ankylosing spondylitis of unspecified sites in spine: Secondary | ICD-10-CM | POA: Diagnosis not present

## 2020-11-26 DIAGNOSIS — D510 Vitamin B12 deficiency anemia due to intrinsic factor deficiency: Secondary | ICD-10-CM | POA: Diagnosis not present

## 2020-11-26 DIAGNOSIS — M1612 Unilateral primary osteoarthritis, left hip: Secondary | ICD-10-CM | POA: Diagnosis not present

## 2020-11-26 DIAGNOSIS — M179 Osteoarthritis of knee, unspecified: Secondary | ICD-10-CM | POA: Diagnosis not present

## 2020-11-26 DIAGNOSIS — I482 Chronic atrial fibrillation, unspecified: Secondary | ICD-10-CM | POA: Diagnosis not present

## 2020-11-27 ENCOUNTER — Encounter: Payer: Self-pay | Admitting: Internal Medicine

## 2020-11-27 ENCOUNTER — Ambulatory Visit (INDEPENDENT_AMBULATORY_CARE_PROVIDER_SITE_OTHER): Payer: Medicare Other | Admitting: Internal Medicine

## 2020-11-27 ENCOUNTER — Other Ambulatory Visit: Payer: Self-pay

## 2020-11-27 VITALS — BP 120/58 | HR 97 | Ht 70.0 in | Wt 266.8 lb

## 2020-11-27 DIAGNOSIS — Z950 Presence of cardiac pacemaker: Secondary | ICD-10-CM | POA: Diagnosis not present

## 2020-11-27 DIAGNOSIS — I442 Atrioventricular block, complete: Secondary | ICD-10-CM | POA: Diagnosis not present

## 2020-11-27 DIAGNOSIS — I4819 Other persistent atrial fibrillation: Secondary | ICD-10-CM | POA: Diagnosis not present

## 2020-11-27 NOTE — Patient Instructions (Addendum)
Medication Instructions:  Your physician recommends that you continue on your current medications as directed. Please refer to the Current Medication list given to you today.  Labwork: None ordered.  Testing/Procedures: Your physician has recommended that you have a Cardioversion (DCCV). Electrical Cardioversion uses a jolt of electricity to your heart either through paddles or wired patches attached to your chest. This is a controlled, usually prescheduled, procedure. Defibrillation is done under light anesthesia in the hospital, and you usually go home the day of the procedure. This is done to get your heart back into a normal rhythm. You are not awake for the procedure. Please see the instruction sheet given to you today.   You are scheduled for a cardioversion on December 09, 2020 at 8:00 am  Follow-Up:  Your physician wants you to follow-up in: 4 weeks with Tommye Standard, PA-C or Legrand Como "[place]" [place], [place]  Remote monitoring is used to monitor your Pacemaker from home. This monitoring reduces the number of office visits required to check your device to one time per year. It allows [place] to keep an eye on the functioning of your device to ensure it is working properly. You are scheduled for a device check from home on 02/19/2021. You may send your transmission at any time that day. If you have a wireless device, the transmission will be sent automatically. After your physician reviews your transmission, you will receive a postcard with your next transmission date.   CARDIOVERSION INSTRUCTIONS;  COVID SCREENING INFORMATION: You are scheduled for your drive-thru COVID screening on December 08, 2020 at 8:45 am. Pre-Procedural COVID-19 Testing Site [phone] [place], [place] [address] You will need to go home after your screening and quarantine until your procedure.   You are scheduled for a Cardioversion on December 09, 2020 with Dr. Gardiner Rhyme.   Please arrive at the [facility] (Main Entrance A)  at [facility]: [address] [place], [place] [address] at 8:00 am.  Your procedure will start at 9:30 am.  DIET: Nothing to eat or drink after midnight except a sip of water with medications (see medication instructions below)  Medication Instructions: You may take your normal morning medications EXCEPT:  bumex and Jardiance.  Labs: your lab work will be done at the hospital prior to your procedure - you will need to arrive 1  hours ahead of your procedure  You must have a responsible person to drive you home and stay in the waiting area during your procedure. Failure to do so could result in cancellation.  Bring your insurance cards.  *Special Note: Every effort is made to have your procedure done on time. Occasionally there are emergencies that occur at the hospital that may cause delays. Please be patient if a delay does occur.

## 2020-11-27 NOTE — Progress Notes (Signed)
HPI Mr. Bodine returns today for followup. He has a h/o obesity and persistent atrial flutter/ fib. He has undergone AV node ablation and DDD PM insertion with an atrial lead in the RA and a left bundle area lead. We went to the left and right side to try and slow him down but could not create CHB. He has recently had worsening VR.  He has not had syncope. He notes that over the past several weeks his HR has been increased. A review of his old transmissions suggests that he had been in rhythm but review demonstrated that he was conducting 2:1 and every other P wave was blanked.  Allergies  Allergen Reactions  . Ace Inhibitors Cough  . Xarelto [Rivaroxaban] Other (See Comments)    Joint pain     Current Outpatient Medications  Medication Sig Dispense Refill  . acetaminophen (TYLENOL) 500 MG tablet Take 1,000 mg by mouth every 6 (six) hours as needed for headache (pain).    [name] amoxicillin (AMOXIL) 500 MG capsule Take 2,000 mg by mouth See admin instructions. Take 4 capsules (2000 mg) by mouth one hour prior to dental appointments    . apixaban (ELIQUIS) 5 MG TABS tablet Take 1 tablet (5 mg total) by mouth 2 (two) times daily. Resume with Sunday evening dose. 180 tablet 1  . bumetanide (BUMEX) 2 MG tablet Take 1 tablet (2 mg total) by mouth daily. 30 tablet 4  . buPROPion (WELLBUTRIN XL) 300 MG 24 hr tablet Take 300 mg by mouth daily.     . Cholecalciferol (VITAMIN D3) 125 MCG (5000 UT) TABS Take 5,000 Units by mouth daily.     . clotrimazole (LOTRIMIN) 1 % cream Apply 1 application topically every 3 (three) days.    . cyanocobalamin (,VITAMIN B-12,) 1000 MCG/ML injection Inject 1,000 mcg into the muscle every 30 (thirty) days.    [name] escitalopram (LEXAPRO) 10 MG tablet Take 10 mg by mouth daily.    [name] JARDIANCE 10 MG TABS tablet TAKE ONE TABLET BY MOUTH DAILY 86 tablet 4  . magnesium oxide (MAG-OX) 400 MG tablet TAKE 2 TABLETS EACH MORNING AND 1 TABLETEACH EVENING. 270 tablet 3  .  metoprolol tartrate (LOPRESSOR) 50 MG tablet Take 1 tablet (50 mg total) by mouth 2 (two) times daily. 180 tablet 0  . mirabegron ER (MYRBETRIQ) 50 MG TB24 tablet Take 50 mg by mouth daily.    . Multiple Vitamin (MULTIVITAMIN WITH MINERALS) TABS tablet Take 1 tablet by mouth daily.    . Multiple Vitamins-Minerals (PRESERVISION AREDS 2 PO) Take 1 capsule by mouth 2 (two) times a day.    [name] Glycol-Propyl Glycol (SYSTANE ULTRA) 0.4-0.3 % SOLN Place 1 drop into both eyes daily as needed (for dry eyes).     . potassium chloride SA (KLOR-CON) 20 MEQ tablet Take 1 tablet (20 mEq total) by mouth daily.    . tamsulosin (FLOMAX) 0.4 MG CAPS capsule Take 0.8 mg by mouth at bedtime.     [name] witch hazel-glycerin (TUCKS) pad Apply topically as needed for itching. For external hemorrhoids. Also available OTC. 40 each 12   No current facility-administered medications for this visit.     Past Medical History:  Diagnosis Date  . Anemia 1980s X 1  . Arthritis    "hands, knees, hips, ankles" (07/31/2015)  . Atrial flutter ([place]) 07/31/2015  . CHF (congestive heart failure) ([place])   . Chronic diastolic heart failure ([place])    a. 11/2015:  Echo w/ EF of 60-65%, no WMA, Grade 2 DD, trivial AR, ascending aorta mildly dilated.   . Depression   . History of cardiovascular stress test    a. 03/2015: Dobutamine Stress Echo with no evidence of ischemia.   [name] History of gastric bypass   . History of peptic ulcer    1980's  . History of radiation therapy 1993   sarcoma of left groin, tx at [facility]  . History of sarcoma    1993  LEFT GOIN--  S/P SURGERY, RADIATION AND CHEMO IN [place]  . Hypertension   . Hypogonadism in male   . Incomplete right bundle branch block   . 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 Nocturia   . OSA on CPAP   . Persistent atrial fibrillation ([place])       . Presence of permanent cardiac  pacemaker 02/20/2020  . Primary prostate adenocarcinoma ([place]) DX 07/08/14   Gleason 7,  stage T1c  . PVC's (premature ventricular contractions) 11/21/2015  . 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 Weakness of left leg    SECONDARY TO SURGICAL NERVE INJURY OF LEFT GOIN  . Wears glasses     ROS:   All systems reviewed and negative except as noted in the HPI.   Past Surgical History:  Procedure Laterality Date  . ATRIAL FIBRILLATION ABLATION N/A 06/08/2018   Procedure: ATRIAL FIBRILLATION ABLATION;  Surgeon: Thompson Grayer, MD;  Location: [place] [facility];  Service: Cardiovascular;  Laterality: N/A;  . ATRIAL FIBRILLATION ABLATION N/A 12/20/2018   Procedure: ATRIAL FIBRILLATION ABLATION;  Surgeon: Thompson Grayer, MD;  Location: [place] [facility];  Service: Cardiovascular;  Laterality: N/A;  . AV NODE ABLATION  02/20/2020  . AV NODE ABLATION N/A 02/20/2020   Procedure: AV NODE ABLATION;  Surgeon: Evans Lance, MD;  Location: [place] [facility];  Service: Cardiovascular;  Laterality: N/A;  . CARDIAC CATHETERIZATION  08-12-2003  dr Tressia Miners turner   Normal coronary arteries, normal wall motion, no sig. abnormalities  . CARDIOVERSION N/A 08/04/2015   Procedure: CARDIOVERSION;  Surgeon: [name], MD;  Location: [place];  Service: Cardiovascular;  Laterality: N/A;  . CARDIOVERSION N/A 10/28/2017   Procedure: CARDIOVERSION;  Surgeon: Larey Dresser, MD;  Location: [facility] ENDOSCOPY;  Service: Cardiovascular;  Laterality: N/A;  . CARDIOVERSION N/A 04/12/2018   Procedure: CARDIOVERSION;  Surgeon: Thayer Headings, MD;  Location: [facility] ENDOSCOPY;  Service: Cardiovascular;  Laterality: N/A;  . CARDIOVERSION N/A 07/26/2018   Procedure: CARDIOVERSION;  Surgeon: Larey Dresser, MD;  Location: [facility] ENDOSCOPY;  Service: Cardiovascular;  Laterality: N/A;  . CARDIOVERSION N/A 10/09/2018   Procedure: CARDIOVERSION;  Surgeon: Sueanne Margarita, MD;  Location: [facility] ENDOSCOPY;  Service: Cardiovascular;   Laterality: N/A;  . COLONOSCOPY  07-03-2002  . [place]  . FRACTURE SURGERY    . GROIN DISSECTION Left 1992   [place]   SARCOMA SURGERY  . INGUINAL HERNIA REPAIR Right 1950s?  . INSERT / REPLACE / REMOVE PACEMAKER  02/20/2020  . INTESTINAL BYPASS  1976   GASTRIC FOR OBESITY  . JOINT REPLACEMENT    . PACEMAKER IMPLANT N/A 02/20/2020   Procedure: PACEMAKER IMPLANT;  Surgeon: Evans Lance, MD;  Location: [place] [facility];  Service: Cardiovascular;  Laterality: N/A;  . PATELLA FRACTURE SURGERY Left ~ 1995   "broke it  twice; only had OR once"  . PROSTATE BIOPSY  06/2014  . RADIOACTIVE SEED IMPLANT N/A 10/17/2014   Procedure: RADIOACTIVE SEED IMPLANT    ;  Surgeon: Ailene Rud, MD;  Location: [facility];  Service: Urology;  Laterality: N/A;   68 SEEDS IMPLANTED   . RIGHT HEART CATH N/A 09/04/2020   Procedure: RIGHT HEART CATH;  Surgeon: Jolaine Artist, MD;  Location: [place] [facility];  Service: Cardiovascular;  Laterality: N/A;  . TEE WITHOUT CARDIOVERSION N/A 10/28/2017   Procedure: TRANSESOPHAGEAL ECHOCARDIOGRAM (TEE);  Surgeon: Larey Dresser, MD;  Location: [facility] ENDOSCOPY;  Service: Cardiovascular;  Laterality: N/A;  . TONSILLECTOMY  1950s  . TOTAL KNEE ARTHROPLASTY Right 12/17/2014   Procedure: TOTAL KNEE ARTHROPLASTY;  Surgeon: Melrose Nakayama, MD;  Location: [place];  Service: Orthopedics;  Laterality: Right;  . TRANSTHORACIC ECHOCARDIOGRAM  08-08-2003   moderate LVH/  ef 55-65%/  mild MR/  moderate LAE/  trivial TR/  trivial pericardial effusion posterior to the heart  . [place]  . VIDEO BRONCHOSCOPY Bilateral 09/21/2016   Procedure: VIDEO BRONCHOSCOPY WITHOUT FLUORO;  Surgeon: Collene Gobble, MD;  Location: [place];  Service: Cardiopulmonary;  Laterality: Bilateral;     Family History  Problem Relation Age of Onset  . Liver disease Mother   . Renal Disease Mother   . Stroke Mother   . Atrial  fibrillation Father   . Cancer Sister      Social History   Socioeconomic History  . Marital status: Married    Spouse name: Not on file  . Number of children: Not on file  . Years of education: Not on file  . Highest education level: Not on file  Occupational History  . Not on file  Tobacco Use  . Smoking status: Never Smoker  . Smokeless tobacco: Never Used  Vaping Use  . Vaping Use: Never used  Substance and Sexual Activity  . Alcohol use: No  . Drug use: No  . Sexual activity: Not Currently  Other Topics Concern  . 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[employer], broadcasting/film/video and art   Social Determinants of Health   Financial Resource Strain: Not on file  Food Insecurity: Not on file  Transportation Needs: Not on file  Physical Activity: Not on file  Stress: Not on file  Social Connections: Not on file  Intimate Partner Violence: Not on file     BP (!) 120/58   Pulse 97   Ht 5\' 10"  (1.778 m)   Wt 266 lb 12.8 oz (121 kg)   SpO2 94%   BMI 38.28 kg/m   Physical Exam:  obese appearing NAD HEENT: Unremarkable Neck:  No JVD, no thyromegally Lymphatics:  No adenopathy Back:  No CVA tenderness Lungs:  Clear with no wheezes HEART:  IRegular tachy rhythm, no murmurs, no rubs, no clicks Abd:  soft, positive bowel sounds, no organomegally, no rebound, no guarding Ext:  2 plus pulses, no edema, no cyanosis, no clubbing Skin:  No rashes no nodules Neuro:  CN II through XII intact, motor grossly intact  DEVICE  Normal device function.  See PaceArt for details.   Assess/Plan: 1. Atrial fib/flutter with a CVR and RVR - I initially thought that the patient was in rhythm and had been out of rhythm for a month. However, review of his PM interrogation confirms that  he has been out of rhythm with 2:1 conduction and some 1:1 AV conduction and the atrial beats blanked. He needs to undergo repeat AV node  ablation rather than DCCV.  2. Obesity - he will be encouraged to lose weight. 3. PPM - his medtronic DDD PM is working normally.  Carleene Overlie Sabriel Borromeo,MD

## 2020-12-01 ENCOUNTER — Other Ambulatory Visit: Payer: Self-pay | Admitting: Student

## 2020-12-01 ENCOUNTER — Telehealth: Payer: Self-pay

## 2020-12-01 DIAGNOSIS — I4819 Other persistent atrial fibrillation: Secondary | ICD-10-CM

## 2020-12-01 NOTE — Telephone Encounter (Signed)
Outreach made to Pt.  Pt requests potential dates be sent via [place].  Dates sent.

## 2020-12-02 NOTE — Progress Notes (Signed)
ADVANCED HF CLINIC NOTE  Referring Physician: Dr. Oval Linsey Primary Care: [name], MD Primary Cardiologist: [name], MD  HPI:  Eric Mayer is a 80 y/o male with morbid obesity s/p gastric bypass 1980s, permanent AF s/p AVN ablation in 5/20 (unable to get CHB) and CRT, HTN, OSA on CPAP, prostate CA and diastolic HF. Referred by Dr. Oval Linsey for further evaluation of his HF and ongoing dyspnea.   Cath 2004 normal coronaries. Myoview 9/19. EF 60% no scar/ischemia  Saw Dr. Lovena Le 10/21 - device interrogation showed only 8% pacing. However 90% of HRs < 100bpm.   PFTs 08/20/16 revealed severe obstruction and likely moderate restriction.  There is also moderate diffusion defect.  He saw Dr. Lamonte Sakai and underwent bronchoscopy on 09/21/16 which showed no intrathoracic obstruction or tracheal abnormality.  Most recent echo 04/2020 revealed EF 60 to 65% with severe LVH.  The septum was 2.5 cm in the posterior wall 2.1 cm.  PYP 10/21 negative  He was admitted in 1/21 with urosepsis and LE edema. Sent home on Bumex 2mg  daily.  D/c weight 125.4 kg. Feeling much better. Feels fluid is much improved   RHC 09/04/20  RA = 3 RV = 38/5 PA = 41/8 (24) PCW = 15 (v = 26) Fick cardiac output/index = 7.6/3.1 Thermo CO/CI = 8.6/3.6 PVR = 1.0 WU FA sat = 97% PA sat = 61%, 62% SVC sat = 61%  Here for f/u. Says he is doing pretty well. Has a foot pedal bike that he uses for 15 min 3x/week. Taking bumex 2mg  daily. Mild LE edema. Taking metolazone when weight up more than 5 pounds - about every 2 weeks. Hard to walk with left leg weakness. Lives at [place]. Taking BP daily SBP 115-125. (Much better after Entresto stopped)    Past Medical History:  Diagnosis Date  . Anemia 1980s X 1  . Arthritis    "hands, knees, hips, ankles" (07/31/2015)  . Atrial flutter ([place]) 07/31/2015  . CHF (congestive heart failure) ([place])   . Chronic diastolic heart failure ([place])    a.  11/2015: Echo w/ EF of 60-65%, no WMA, Grade 2 DD, trivial AR, ascending aorta mildly dilated.   . Depression   . History of cardiovascular stress test    a. 03/2015: Dobutamine Stress Echo with no evidence of ischemia.   [name] History of gastric bypass   . History of peptic ulcer    1980's  . History of radiation therapy 1993   sarcoma of left groin, tx at [facility]  . History of sarcoma    1993  LEFT GOIN--  S/P SURGERY, RADIATION AND CHEMO IN [place]  . Hypertension   . Hypogonadism in male   . Incomplete right bundle branch block   . 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 Nocturia   . OSA on CPAP   . Persistent atrial fibrillation ([place])       . 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 Primary prostate adenocarcinoma ([place]) DX 07/08/14   Gleason 7,  stage T1c  . PVC's (premature ventricular contractions) 11/21/2015  . 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 Weakness of left leg    SECONDARY TO SURGICAL NERVE INJURY OF LEFT GOIN  . Wears glasses     Current Outpatient Medications  Medication Sig Dispense Refill  . acetaminophen (TYLENOL)  500 MG tablet Take 1,000 mg by mouth every 6 (six) hours as needed for headache (pain).    [name] amoxicillin (AMOXIL) 500 MG capsule Take 2,000 mg by mouth See admin instructions. Take 4 capsules (2000 mg) by mouth one hour prior to dental appointments    . bumetanide (BUMEX) 2 MG tablet Take 1 tablet (2 mg total) by mouth daily. 30 tablet 4  . buPROPion (WELLBUTRIN XL) 300 MG 24 hr tablet Take 300 mg by mouth daily.     . Cholecalciferol (VITAMIN D3) 125 MCG (5000 UT) TABS Take 5,000 Units by mouth daily.     . clotrimazole (LOTRIMIN) 1 % cream Apply 1 application topically every 3 (three) days.    . cyanocobalamin (,VITAMIN B-12,) 1000 MCG/ML injection Inject 1,000 mcg into the muscle every 30 (thirty) days.    [name] ELIQUIS 5 MG TABS tablet TAKE ONE  TABLET BY MOUTH TWICE DAILY 180 tablet 0  . escitalopram (LEXAPRO) 10 MG tablet Take 10 mg by mouth daily.    [name] JARDIANCE 10 MG TABS tablet TAKE ONE TABLET BY MOUTH DAILY 86 tablet 4  . magnesium oxide (MAG-OX) 400 MG tablet Take 2 tablets (800 mg total) by mouth every morning AND 1 tablet (400 mg total) every evening. 90 tablet 11  . metoprolol tartrate (LOPRESSOR) 50 MG tablet Take 1 tablet (50 mg total) by mouth 2 (two) times daily. 180 tablet 0  . mirabegron ER (MYRBETRIQ) 50 MG TB24 tablet Take 50 mg by mouth daily.    . Multiple Vitamin (MULTIVITAMIN WITH MINERALS) TABS tablet Take 1 tablet by mouth daily.    . Multiple Vitamins-Minerals (PRESERVISION AREDS 2 PO) Take 1 capsule by mouth 2 (two) times a day.    [name] Glycol-Propyl Glycol (SYSTANE ULTRA) 0.4-0.3 % SOLN Place 1 drop into both eyes daily as needed (for dry eyes).     . potassium chloride SA (KLOR-CON) 20 MEQ tablet Take 1 tablet (20 mEq total) by mouth daily.    . tamsulosin (FLOMAX) 0.4 MG CAPS capsule Take 0.8 mg by mouth at bedtime.     [name] witch hazel-glycerin (TUCKS) pad Apply topically as needed for itching. For external hemorrhoids. Also available OTC. 40 each 12   No current facility-administered medications for this encounter.    Allergies  Allergen Reactions  . Ace Inhibitors Cough  . Xarelto [Rivaroxaban] Other (See Comments)    Joint pain      Social History   Socioeconomic History  . Marital status: Married    Spouse name: Not on file  . Number of children: Not on file  . Years of education: Not on file  . Highest education level: Not on file  Occupational History  . Not on file  Tobacco Use  . Smoking status: Never Smoker  . Smokeless tobacco: Never Used  Vaping Use  . Vaping Use: Never used  Substance and Sexual Activity  . Alcohol use: No  . Drug use: No  . Sexual activity: Not Currently  Other Topics Concern  . 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 Liver disease Mother   . Renal Disease Mother   . Stroke Mother   . Atrial fibrillation Father   . Cancer Sister     Vitals:   12/03/20 1437  BP: 124/70  Pulse: 81  SpO2: 98%  Weight: 126.1 kg (278 lb)   Wt Readings from Last 3 Encounters:  12/03/20 126.1 kg (278 lb)  11/27/20 121 kg (266 lb 12.8 oz)  10/01/20 126 kg (277 lb 12.8 oz)     PHYSICAL EXAM: General:  Sitting in chair No resp difficulty HEENT: normal Neck: supple. JVP 8. Carotids 2+ bilat; no bruits. No lymphadenopathy or thryomegaly appreciated. Cor: PMI nondisplaced. Irregular rate & rhythm. No rubs, gallops or murmurs. Lungs: clear Abdomen: obese soft, nontender, nondistended. No hepatosplenomegaly. No bruits or masses. Good bowel sounds. Extremities: no cyanosis, clubbing, rash, 1-2+ edema L>R Neuro: alert & orientedx3, cranial nerves grossly intact. moves all 4 extremities w/o difficulty. Left leg weak Affect pleasant   ASSESSMENT & PLAN:  1. Chronic dyspnea - suspect this is multifactorial (HF, AF, obesity, deconditioning, restrictive lung physiology) - currently volume status mildly elevated. Management as below.  - RHC 1/22 confirmed relatively normal filling pressures but did have elevated v-waves in PCWP tracing. Could consider exercise testing with invasive monitoring - Overall improving. Urged him to increase exercise regimen  2. Chronic diastolic HF - Echo 9/14 EF 60 to 65% with severe LVH.  The septum was 2.5 cm in the posterior wall 2.1 cm.RV ok - PYP 10/21 negative - Volume status improved but up today in  setting of high fluid intake. Continue prn metolazone  - RHC 1/22 confirmed relatively normal filling pressures but did have elevated v-waves in PCWP tracing.  - On Bumex 2mg  daily - Continue Jardiance 10 - Continue metoprolol 25 bid - Entresto stopped due to low BP- Can consider Cardiomems as needed if we continue to struggle with fluid balance - Labs today  3. Permanent AF -  permanent AF s/p AVN ablation in 5/20 (unable to get CHB) and CRT, - followed by Dr. Lovena Le - rates improved on metoprolol. Rates 70-90 on Apple Watch - He has seen Dr. Lovena Le and is schedule for repeat AVN ablation in early May in the setting of periods 1:1 AV conduction  - On Eliquis. No bleeding.   4. OSA on CPAP - compliant with CPAP  5. Restrictive lung disease - PFTs 5/21: FEV1 1.66 (59%) FVC 2.28 (59%) DLCO 69%  5. Morbid obesity - s/p gastric bypass - Body mass index is 39.89 kg/m.  - would likely benefit significantly from weight loss  Glori Bickers, MD  3:09 PM

## 2020-12-03 ENCOUNTER — Encounter (HOSPITAL_COMMUNITY): Payer: Self-pay | Admitting: Internal Medicine

## 2020-12-03 ENCOUNTER — Ambulatory Visit (HOSPITAL_COMMUNITY)
Admission: RE | Admit: 2020-12-03 | Discharge: 2020-12-03 | Disposition: A | Discharge status: Home / Self Care | Payer: Medicare Other | Source: Ambulatory Visit | Attending: Internal Medicine | Admitting: Internal Medicine

## 2020-12-03 ENCOUNTER — Other Ambulatory Visit: Payer: Self-pay

## 2020-12-03 VITALS — BP 124/70 | HR 81 | Wt 278.0 lb

## 2020-12-03 DIAGNOSIS — I4821 Permanent atrial fibrillation: Secondary | ICD-10-CM | POA: Insufficient documentation

## 2020-12-03 DIAGNOSIS — I5032 Chronic diastolic (congestive) heart failure: Secondary | ICD-10-CM | POA: Diagnosis not present

## 2020-12-03 DIAGNOSIS — Z79899 Other long term (current) drug therapy: Secondary | ICD-10-CM | POA: Diagnosis not present

## 2020-12-03 DIAGNOSIS — Z9989 Dependence on other enabling machines and devices: Secondary | ICD-10-CM | POA: Insufficient documentation

## 2020-12-03 DIAGNOSIS — Z888 Allergy status to other drugs, medicaments and biological substances status: Secondary | ICD-10-CM | POA: Insufficient documentation

## 2020-12-03 DIAGNOSIS — I4819 Other persistent atrial fibrillation: Secondary | ICD-10-CM | POA: Diagnosis not present

## 2020-12-03 DIAGNOSIS — Z6839 Body mass index (BMI) 39.0-39.9, adult: Secondary | ICD-10-CM | POA: Insufficient documentation

## 2020-12-03 DIAGNOSIS — Z9884 Bariatric surgery status: Secondary | ICD-10-CM | POA: Insufficient documentation

## 2020-12-03 DIAGNOSIS — J984 Other disorders of lung: Secondary | ICD-10-CM | POA: Diagnosis not present

## 2020-12-03 DIAGNOSIS — G4733 Obstructive sleep apnea (adult) (pediatric): Secondary | ICD-10-CM | POA: Diagnosis not present

## 2020-12-03 DIAGNOSIS — R0609 Other forms of dyspnea: Secondary | ICD-10-CM | POA: Insufficient documentation

## 2020-12-03 DIAGNOSIS — Z7182 Exercise counseling: Secondary | ICD-10-CM | POA: Diagnosis not present

## 2020-12-03 DIAGNOSIS — Z7901 Long term (current) use of anticoagulants: Secondary | ICD-10-CM | POA: Diagnosis not present

## 2020-12-03 DIAGNOSIS — I110 Hypertensive heart disease with heart failure: Secondary | ICD-10-CM | POA: Insufficient documentation

## 2020-12-03 LAB — BASIC METABOLIC PANEL
Anion gap: 8 (ref 5–15)
BUN: 29 mg/dL — ABNORMAL HIGH (ref 8–23)
CO2: 26 mmol/L (ref 22–32)
Calcium: 8.9 mg/dL (ref 8.9–10.3)
Chloride: 106 mmol/L (ref 98–111)
Creatinine, Ser: 1.26 mg/dL — ABNORMAL HIGH (ref 0.61–1.24)
GFR, Estimated: 59 mL/min — ABNORMAL LOW (ref >60)
Glucose, Bld: 100 mg/dL — ABNORMAL HIGH (ref 70–99)
Potassium: 4.6 mmol/L (ref 3.5–5.1)
Sodium: 140 mmol/L (ref 135–145)

## 2020-12-03 LAB — BRAIN NATRIURETIC PEPTIDE: B Natriuretic Peptide: 382.1 pg/mL — ABNORMAL HIGH (ref 0.0–100.0)

## 2020-12-03 NOTE — Progress Notes (Signed)
Remote pacemaker transmission.   

## 2020-12-03 NOTE — Patient Instructions (Signed)
Labs done today, your results will be available in MyChart, we will contact you for abnormal readings.  Please call our office in September to schedule your follow up appointment  If you have any questions or concerns before your next appointment please send [place] a message through [place] or call our office at [phone].    TO LEAVE A MESSAGE FOR THE NURSE SELECT OPTION 2, PLEASE LEAVE A MESSAGE INCLUDING: . YOUR NAME . DATE OF BIRTH . CALL BACK NUMBER . REASON FOR CALL**this is important as we prioritize the call backs  [place] AS LONG AS YOU CALL BEFORE 4:00 PM  At the [facility], you and your health needs are our priority. As part of our continuing mission to provide you with exceptional heart care, we have created designated Provider Care Teams. These Care Teams include your primary Cardiologist (physician) and Advanced Practice Providers (APPs- Physician Assistants and Nurse Practitioners) who all work together to provide you with the care you need, when you need it.   You may see any of the following providers on your designated Care Team at your next follow up: [name] Dr Glori Bickers . Dr [name] . Dr Vickki Muff . Darrick Grinder, NP . Lyda Jester, [place] . Audry Riles, PharmD   Please be sure to bring in all your medications bottles to every appointment.

## 2020-12-03 NOTE — Addendum Note (Signed)
Encounter addended by: Scarlette Calico, RN on: 12/03/2020 3:38 PM  Actions taken: Order list changed, Diagnosis association updated, Clinical Note Signed, Charge Capture section accepted

## 2020-12-05 NOTE — Telephone Encounter (Signed)
Pt would like AV node ablation May 16 at 11:30  Will complete follow up

## 2020-12-08 ENCOUNTER — Other Ambulatory Visit (HOSPITAL_COMMUNITY): Payer: BC Managed Care – PPO

## 2020-12-08 NOTE — Telephone Encounter (Signed)
Pt scheduled for lab work, covid test.  Materials engineer sent via [employer].  Work up complete.

## 2020-12-09 ENCOUNTER — Ambulatory Visit (HOSPITAL_COMMUNITY): Admit: 2020-12-09 | Payer: BC Managed Care – PPO | Admitting: Cardiology

## 2020-12-09 ENCOUNTER — Encounter (HOSPITAL_COMMUNITY): Payer: Self-pay

## 2020-12-09 SURGERY — CARDIOVERSION
Anesthesia: General

## 2020-12-23 ENCOUNTER — Telehealth: Payer: Self-pay | Admitting: Internal Medicine

## 2020-12-23 ENCOUNTER — Other Ambulatory Visit: Payer: Self-pay | Admitting: Cardiology

## 2020-12-23 DIAGNOSIS — I10 Essential (primary) hypertension: Secondary | ICD-10-CM | POA: Diagnosis not present

## 2020-12-23 DIAGNOSIS — J45909 Unspecified asthma, uncomplicated: Secondary | ICD-10-CM | POA: Diagnosis not present

## 2020-12-23 DIAGNOSIS — I5030 Unspecified diastolic (congestive) heart failure: Secondary | ICD-10-CM | POA: Diagnosis not present

## 2020-12-23 DIAGNOSIS — D510 Vitamin B12 deficiency anemia due to intrinsic factor deficiency: Secondary | ICD-10-CM | POA: Diagnosis not present

## 2020-12-23 DIAGNOSIS — I482 Chronic atrial fibrillation, unspecified: Secondary | ICD-10-CM | POA: Diagnosis not present

## 2020-12-23 DIAGNOSIS — I480 Paroxysmal atrial fibrillation: Secondary | ICD-10-CM | POA: Diagnosis not present

## 2020-12-23 MED ORDER — BUMETANIDE 2 MG PO TABS
2.0000 mg | ORAL_TABLET | Freq: Every day | ORAL | 4 refills | Status: DC
Start: 2020-12-23 — End: 2021-05-12

## 2020-12-23 NOTE — Telephone Encounter (Signed)
pharmacist is asking for a new prescription be sent in to their office for the bumetanide (BUMEX) 2 MG tablet

## 2020-12-23 NOTE — Telephone Encounter (Signed)
This is a CHF pt, Dr. [name] 

## 2020-12-25 ENCOUNTER — Other Ambulatory Visit: Payer: Self-pay | Admitting: Cardiology

## 2020-12-25 MED ORDER — POTASSIUM CHLORIDE CRYS ER 20 MEQ PO TBCR: 20.0000 meq | EXTENDED_RELEASE_TABLET | Freq: Every day | ORAL | 3 refills | Status: AC

## 2020-12-26 ENCOUNTER — Other Ambulatory Visit: Payer: Self-pay

## 2020-12-26 ENCOUNTER — Other Ambulatory Visit: Payer: Medicare Other | Admitting: *Deleted

## 2020-12-26 ENCOUNTER — Other Ambulatory Visit (HOSPITAL_COMMUNITY)
Admission: RE | Admit: 2020-12-26 | Discharge: 2020-12-26 | Disposition: A | Discharge status: Home / Self Care | Payer: Medicare Other | Source: Ambulatory Visit | Attending: Internal Medicine | Admitting: Internal Medicine

## 2020-12-26 DIAGNOSIS — Z01812 Encounter for preprocedural laboratory examination: Secondary | ICD-10-CM | POA: Insufficient documentation

## 2020-12-26 DIAGNOSIS — I4819 Other persistent atrial fibrillation: Secondary | ICD-10-CM

## 2020-12-26 DIAGNOSIS — Z20822 Contact with and (suspected) exposure to covid-19: Secondary | ICD-10-CM | POA: Insufficient documentation

## 2020-12-26 LAB — CBC WITH DIFFERENTIAL/PLATELET
Basophils Absolute: 0 10*3/uL (ref 0.0–0.2)
Basos: 1 %
EOS (ABSOLUTE): 0.1 10*3/uL (ref 0.0–0.4)
Eos: 2 %
Hematocrit: 30.2 % — ABNORMAL LOW (ref 37.5–51.0)
Hemoglobin: 8.2 g/dL — ABNORMAL LOW (ref 13.0–17.7)
Immature Grans (Abs): 0 10*3/uL (ref 0.0–0.1)
Immature Granulocytes: 0 %
Lymphocytes Absolute: 1.8 10*3/uL (ref 0.7–3.1)
Lymphs: 26 %
MCH: 19.8 pg — ABNORMAL LOW (ref 26.6–33.0)
MCHC: 27.2 g/dL — ABNORMAL LOW (ref 31.5–35.7)
MCV: 73 fL — ABNORMAL LOW (ref 79–97)
Monocytes Absolute: 0.5 10*3/uL (ref 0.1–0.9)
Monocytes: 7 %
Neutrophils Absolute: 4.6 10*3/uL (ref 1.4–7.0)
Neutrophils: 64 %
Platelets: 194 10*3/uL (ref 150–450)
RBC: 4.14 x10E6/uL (ref 4.14–5.80)
RDW: 17.2 % — ABNORMAL HIGH (ref 11.6–15.4)
WBC: 7.1 10*3/uL (ref 3.4–10.8)

## 2020-12-26 LAB — SARS CORONAVIRUS 2 (TAT 6-24 HRS): SARS Coronavirus 2: NEGATIVE

## 2020-12-29 ENCOUNTER — Ambulatory Visit (HOSPITAL_COMMUNITY)
Admission: RE | Admit: 2020-12-29 | Discharge: 2020-12-29 | Disposition: A | Discharge status: Home / Self Care | Payer: Medicare Other | Attending: Internal Medicine | Admitting: Internal Medicine

## 2020-12-29 ENCOUNTER — Other Ambulatory Visit: Payer: Self-pay

## 2020-12-29 ENCOUNTER — Encounter (HOSPITAL_COMMUNITY)
Admission: RE | Disposition: A | Discharge status: Home / Self Care | Payer: BC Managed Care – PPO | Source: Home / Self Care | Attending: Internal Medicine

## 2020-12-29 DIAGNOSIS — Z7901 Long term (current) use of anticoagulants: Secondary | ICD-10-CM | POA: Diagnosis not present

## 2020-12-29 DIAGNOSIS — Z950 Presence of cardiac pacemaker: Secondary | ICD-10-CM | POA: Diagnosis not present

## 2020-12-29 DIAGNOSIS — Z6838 Body mass index (BMI) 38.0-38.9, adult: Secondary | ICD-10-CM | POA: Insufficient documentation

## 2020-12-29 DIAGNOSIS — E669 Obesity, unspecified: Secondary | ICD-10-CM | POA: Insufficient documentation

## 2020-12-29 DIAGNOSIS — I4892 Unspecified atrial flutter: Secondary | ICD-10-CM | POA: Diagnosis not present

## 2020-12-29 DIAGNOSIS — Z7984 Long term (current) use of oral hypoglycemic drugs: Secondary | ICD-10-CM | POA: Diagnosis not present

## 2020-12-29 DIAGNOSIS — I4819 Other persistent atrial fibrillation: Secondary | ICD-10-CM | POA: Diagnosis not present

## 2020-12-29 DIAGNOSIS — I4891 Unspecified atrial fibrillation: Secondary | ICD-10-CM | POA: Insufficient documentation

## 2020-12-29 DIAGNOSIS — Z79899 Other long term (current) drug therapy: Secondary | ICD-10-CM | POA: Insufficient documentation

## 2020-12-29 HISTORY — PX: AV NODE ABLATION: EP1193

## 2020-12-29 LAB — GLUCOSE, CAPILLARY: Glucose-Capillary: 119 mg/dL — ABNORMAL HIGH (ref 70–99)

## 2020-12-29 SURGERY — AV NODE ABLATION

## 2020-12-29 MED ORDER — ACETAMINOPHEN 325 MG PO TABS
650.0000 mg | ORAL_TABLET | ORAL | Status: DC | PRN
  Filled 2020-12-29: qty 2

## 2020-12-29 MED ORDER — HEPARIN (PORCINE) IN NACL 2-0.9 UNITS/ML
INTRAMUSCULAR | Status: AC | PRN
  Administered 2020-12-29: 500 mL

## 2020-12-29 MED ORDER — MIDAZOLAM HCL 5 MG/5ML IJ SOLN
INTRAMUSCULAR | Status: AC
  Filled 2020-12-29: qty 5

## 2020-12-29 MED ORDER — SODIUM CHLORIDE 0.9% FLUSH: 3.0000 mL | Freq: Two times a day (BID) | INTRAVENOUS | Status: DC

## 2020-12-29 MED ORDER — BUPIVACAINE HCL (PF) 0.25 % IJ SOLN
INTRAMUSCULAR | Status: DC | PRN
  Administered 2020-12-29: 30 mL

## 2020-12-29 MED ORDER — MIDAZOLAM HCL 5 MG/5ML IJ SOLN
INTRAMUSCULAR | Status: DC | PRN
  Administered 2020-12-29: 1 mg via INTRAVENOUS
  Administered 2020-12-29: 2 mg via INTRAVENOUS

## 2020-12-29 MED ORDER — FENTANYL CITRATE (PF) 100 MCG/2ML IJ SOLN
INTRAMUSCULAR | Status: AC
  Filled 2020-12-29: qty 2

## 2020-12-29 MED ORDER — SODIUM CHLORIDE 0.9 % IV SOLN: 250.0000 mL | INTRAVENOUS | Status: DC | PRN

## 2020-12-29 MED ORDER — FENTANYL CITRATE (PF) 100 MCG/2ML IJ SOLN
INTRAMUSCULAR | Status: DC | PRN
  Administered 2020-12-29: 25 ug via INTRAVENOUS
  Administered 2020-12-29: 12.5 ug via INTRAVENOUS

## 2020-12-29 MED ORDER — HEPARIN (PORCINE) IN NACL 1000-0.9 UT/500ML-% IV SOLN
INTRAVENOUS | Status: AC
  Filled 2020-12-29: qty 500

## 2020-12-29 MED ORDER — SODIUM CHLORIDE 0.9 % IV SOLN: INTRAVENOUS | Status: DC

## 2020-12-29 MED ORDER — ONDANSETRON HCL 4 MG/2ML IJ SOLN: 4.0000 mg | Freq: Four times a day (QID) | INTRAMUSCULAR | Status: DC | PRN

## 2020-12-29 MED ORDER — BUPIVACAINE HCL (PF) 0.25 % IJ SOLN
INTRAMUSCULAR | Status: AC
  Filled 2020-12-29: qty 30

## 2020-12-29 MED ORDER — SODIUM CHLORIDE 0.9% FLUSH: 3.0000 mL | INTRAVENOUS | Status: DC | PRN

## 2020-12-29 SURGICAL SUPPLY — 7 items
BAG SNAP BAND KOVER 36X36 (MISCELLANEOUS) ×1 IMPLANT
CATH CELSIUS THERMO F CV 7FR (ABLATOR) ×1 IMPLANT
PACK EP LATEX FREE (CUSTOM PROCEDURE TRAY) ×2
PACK EP LF (CUSTOM PROCEDURE TRAY) ×1 IMPLANT
PAD PRO RADIOLUCENT 2001M-C (PAD) ×2 IMPLANT
SHEATH PINNACLE 8F 10CM (SHEATH) ×1 IMPLANT
SHIELD RADPAD SCOOP 12X17 (MISCELLANEOUS) ×1 IMPLANT

## 2020-12-29 NOTE — Progress Notes (Signed)
Discharge instructions reviewed with pt and his wife. Both voice understanding. 

## 2020-12-29 NOTE — Progress Notes (Signed)
Site area: rt groin fv sheath pulled and pressure held by Caren Griffins Site Prior to Removal:  Level 0 Pressure Applied For: 20 minutes Manual:   yes Patient Status During Pull:  stable Post Pull Site:  Level 0 Post Pull Instructions Given:  yes Post Pull Pulses Present:  Dressing Applied:  Gauze and tegaderm Bedrest begins @ 1320 Comments:

## 2020-12-29 NOTE — Discharge Instructions (Signed)

## 2020-12-29 NOTE — H&P (Signed)
    HPI Mr. Vanwey returns today for followup. He has a h/o obesity and persistent atrial flutter/ fib. He has undergone AV node ablation and DDD PM insertion with an atrial lead in the RA and a left bundle area lead. We went to the left and right side to try and slow him down but could not create CHB. He has recently had worsening VR.  He has not had syncope. He notes that over the past several weeks his HR has been increased. A review of his old transmissions suggests that he had been in rhythm but review demonstrated that he was conducting 2:1 and every other P wave was blanked.  Allergies  Allergen Reactions  . Ace Inhibitors Cough  . Xarelto [Rivaroxaban] Other (See Comments)    Joint pain     Current Outpatient Medications  Medication Sig Dispense Refill  . acetaminophen (TYLENOL) 500 MG tablet Take 1,000 mg by mouth every 6 (six) hours as needed for headache (pain).    . amoxicillin (AMOXIL) 500 MG capsule Take 2,000 mg by mouth See admin instructions. Take 4 capsules (2000 mg) by mouth one hour prior to dental appointments    . apixaban (ELIQUIS) 5 MG TABS tablet Take 1 tablet (5 mg total) by mouth 2 (two) times daily. Resume with Sunday evening dose. 180 tablet 1  . bumetanide (BUMEX) 2 MG tablet Take 1 tablet (2 mg total) by mouth daily. 30 tablet 4  . buPROPion (WELLBUTRIN XL) 300 MG 24 hr tablet Take 300 mg by mouth daily.     . Cholecalciferol (VITAMIN D3) 125 MCG (5000 UT) TABS Take 5,000 Units by mouth daily.     . clotrimazole (LOTRIMIN) 1 % cream Apply 1 application topically every 3 (three) days.    . cyanocobalamin (,VITAMIN B-12,) 1000 MCG/ML injection Inject 1,000 mcg into the muscle every 30 (thirty) days.    . escitalopram (LEXAPRO) 10 MG tablet Take 10 mg by mouth daily.    . JARDIANCE 10 MG TABS tablet TAKE ONE TABLET BY MOUTH DAILY 86 tablet 4  . magnesium oxide (MAG-OX) 400 MG tablet TAKE 2 TABLETS EACH MORNING AND 1 TABLETEACH EVENING. 270 tablet 3  .  metoprolol tartrate (LOPRESSOR) 50 MG tablet Take 1 tablet (50 mg total) by mouth 2 (two) times daily. 180 tablet 0  . mirabegron ER (MYRBETRIQ) 50 MG TB24 tablet Take 50 mg by mouth daily.    . Multiple Vitamin (MULTIVITAMIN WITH MINERALS) TABS tablet Take 1 tablet by mouth daily.    . Multiple Vitamins-Minerals (PRESERVISION AREDS 2 PO) Take 1 capsule by mouth 2 (two) times a day.    . Polyethyl Glycol-Propyl Glycol (SYSTANE ULTRA) 0.4-0.3 % SOLN Place 1 drop into both eyes daily as needed (for dry eyes).     . potassium chloride SA (KLOR-CON) 20 MEQ tablet Take 1 tablet (20 mEq total) by mouth daily.    . tamsulosin (FLOMAX) 0.4 MG CAPS capsule Take 0.8 mg by mouth at bedtime.     . witch hazel-glycerin (TUCKS) pad Apply topically as needed for itching. For external hemorrhoids. Also available OTC. 40 each 12   No current facility-administered medications for this visit.     Past Medical History:  Diagnosis Date  . Anemia 1980s X 1  . Arthritis    "hands, knees, hips, ankles" (07/31/2015)  . Atrial flutter (HCC) 07/31/2015  . CHF (congestive heart failure) (HCC)   . Chronic diastolic heart failure (HCC)    a. 11/2015:   Echo w/ EF of 60-65%, no WMA, Grade 2 DD, trivial AR, ascending aorta mildly dilated.   . Depression   . History of cardiovascular stress test    a. 03/2015: Dobutamine Stress Echo with no evidence of ischemia.   . History of gastric bypass   . History of peptic ulcer    1980's  . History of radiation therapy 1993   sarcoma of left groin, tx at [facility]  . History of sarcoma    1993  LEFT GOIN--  S/P SURGERY, RADIATION AND CHEMO IN [place]  . Hypertension   . Hypogonadism in male   . Incomplete right bundle branch block   . 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 Nocturia   . OSA on CPAP   . Persistent atrial fibrillation (HCC)       . Presence of permanent cardiac  pacemaker 02/20/2020  . Primary prostate adenocarcinoma (HCC) DX 07/08/14   Gleason 7,  stage T1c  . PVC's (premature ventricular contractions) 11/21/2015  . Sarcoma (HCC) ~ 1993/1994   "of groin"  . Weakness of left leg    SECONDARY TO SURGICAL NERVE INJURY OF LEFT GOIN  . Wears glasses     ROS:   All systems reviewed and negative except as noted in the HPI.   Past Surgical History:  Procedure Laterality Date  . ATRIAL FIBRILLATION ABLATION N/A 06/08/2018   Procedure: ATRIAL FIBRILLATION ABLATION;  Surgeon: Allred, James, MD;  [facility];  Service: Cardiovascular;  Laterality: N/A;  . ATRIAL FIBRILLATION ABLATION N/A 12/20/2018   Procedure: ATRIAL FIBRILLATION ABLATION;  Surgeon: Allred, James, MD;  [facility];  Service: Cardiovascular;  Laterality: N/A;  . AV NODE ABLATION  02/20/2020  . AV NODE ABLATION N/A 02/20/2020   Procedure: AV NODE ABLATION;  Surgeon: Korinne Greenstein W, MD;  [facility];  Service: Cardiovascular;  Laterality: N/A;  . CARDIAC CATHETERIZATION  08-12-2003  dr traci turner   Normal coronary arteries, normal wall motion, no sig. abnormalities  . CARDIOVERSION N/A 08/04/2015   Procedure: CARDIOVERSION;  Surgeon: Tiffany DuBois, MD;  [facility];  Service: Cardiovascular;  Laterality: N/A;  . CARDIOVERSION N/A 10/28/2017   Procedure: CARDIOVERSION;  Surgeon: McLean, Dalton S, MD;  [facility];  Service: Cardiovascular;  Laterality: N/A;  . CARDIOVERSION N/A 04/12/2018   Procedure: CARDIOVERSION;  Surgeon: Nahser, Philip J, MD;  [facility];  Service: Cardiovascular;  Laterality: N/A;  . CARDIOVERSION N/A 07/26/2018   Procedure: CARDIOVERSION;  Surgeon: McLean, Dalton S, MD;  [facility];  Service: Cardiovascular;  Laterality: N/A;  . CARDIOVERSION N/A 10/09/2018   Procedure: CARDIOVERSION;  Surgeon: Turner, Traci R, MD;  [facility];  Service: Cardiovascular;   Laterality: N/A;  . COLONOSCOPY  07-03-2002  . FOREARM FRACTURE SURGERY  1986  . FRACTURE SURGERY    . GROIN DISSECTION Left 1992   [place]   SARCOMA SURGERY  . INGUINAL HERNIA REPAIR Right 1950s?  . INSERT / REPLACE / REMOVE PACEMAKER  02/20/2020  . INTESTINAL BYPASS  1976   GASTRIC FOR OBESITY  . JOINT REPLACEMENT    . PACEMAKER IMPLANT N/A 02/20/2020   Procedure: PACEMAKER IMPLANT;  Surgeon: Leniyah Martell W, MD;  [facility];  Service: Cardiovascular;  Laterality: N/A;  . PATELLA FRACTURE SURGERY Left ~ 1995   "broke it   twice; only had OR once"  . PROSTATE BIOPSY  06/2014  . RADIOACTIVE SEED IMPLANT N/A 10/17/2014   Procedure: RADIOACTIVE SEED IMPLANT    ;  Surgeon: Sigmund I Tannenbaum, MD;  Location: [facility];  Service: Urology;  Laterality: N/A;   68 SEEDS IMPLANTED   . RIGHT HEART CATH N/A 09/04/2020   Procedure: RIGHT HEART CATH;  Surgeon: [name], Daniel R, MD;  [facility];  Service: Cardiovascular;  Laterality: N/A;  . TEE WITHOUT CARDIOVERSION N/A 10/28/2017   Procedure: TRANSESOPHAGEAL ECHOCARDIOGRAM (TEE);  Surgeon: McLean, Dalton S, MD;  [facility];  Service: Cardiovascular;  Laterality: N/A;  . TONSILLECTOMY  1950s  . TOTAL KNEE ARTHROPLASTY Right 12/17/2014   Procedure: TOTAL KNEE ARTHROPLASTY;  Surgeon: Peter Dalldorf, MD;  [facility];  Service: Orthopedics;  Laterality: Right;  . TRANSTHORACIC ECHOCARDIOGRAM  08-08-2003   moderate LVH/  ef 55-65%/  mild MR/  moderate LAE/  trivial TR/  trivial pericardial effusion posterior to the heart  . VENTRAL HERNIA REPAIR  1977  . VIDEO BRONCHOSCOPY Bilateral 09/21/2016   Procedure: VIDEO BRONCHOSCOPY WITHOUT FLUORO;  Surgeon: Robert S Byrum, MD;  [facility];  Service: Cardiopulmonary;  Laterality: Bilateral;     Family History  Problem Relation Age of Onset  . Liver disease Mother   . Renal Disease Mother   . Stroke Mother   . Atrial  fibrillation Father   . Cancer Sister      Social History   Socioeconomic History  . Marital status: Married    Spouse name: Not on file  . Number of children: Not on file  . Years of education: Not on file  . Highest education level: Not on file  Occupational History  . Not on file  Tobacco Use  . Smoking status: Never Smoker  . Smokeless tobacco: Never Used  Vaping Use  . Vaping Use: Never used  Substance and Sexual Activity  . Alcohol use: No  . Drug use: No  . Sexual activity: Not Currently  Other Topics Concern  . 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   Social Determinants of Health   Financial Resource Strain: Not on file  Food Insecurity: Not on file  Transportation Needs: Not on file  Physical Activity: Not on file  Stress: Not on file  Social Connections: Not on file  Intimate Partner Violence: Not on file     BP (!) 120/58   Pulse 97   Ht 5' 10" (1.778 m)   Wt 266 lb 12.8 oz (121 kg)   SpO2 94%   BMI 38.28 kg/m   Physical Exam:  obese appearing NAD HEENT: Unremarkable Neck:  No JVD, no thyromegally Lymphatics:  No adenopathy Back:  No CVA tenderness Lungs:  Clear with no wheezes HEART:  IRegular tachy rhythm, no murmurs, no rubs, no clicks Abd:  soft, positive bowel sounds, no organomegally, no rebound, no guarding Ext:  2 plus pulses, no edema, no cyanosis, no clubbing Skin:  No rashes no nodules Neuro:  CN II through XII intact, motor grossly intact  DEVICE  Normal device function.  See PaceArt for details.   Assess/Plan: 1. Atrial fib/flutter with a CVR and RVR - I initially thought that the patient was in rhythm and had been out of rhythm for a month. However, review of his PM interrogation confirms that   he has been out of rhythm with 2:1 conduction and some 1:1 AV conduction and the atrial beats blanked. He needs to undergo repeat AV node  ablation rather than DCCV.  2. Obesity - he will be encouraged to lose weight. 3. PPM - his medtronic DDD PM is working normally.  Satoshi Kalas,MD 

## 2020-12-30 ENCOUNTER — Encounter (HOSPITAL_COMMUNITY): Payer: Self-pay | Admitting: Internal Medicine

## 2021-01-14 ENCOUNTER — Other Ambulatory Visit: Payer: Self-pay | Admitting: Internal Medicine

## 2021-01-20 ENCOUNTER — Other Ambulatory Visit: Payer: Self-pay

## 2021-01-20 ENCOUNTER — Ambulatory Visit (INDEPENDENT_AMBULATORY_CARE_PROVIDER_SITE_OTHER): Payer: Medicare Other | Admitting: Student

## 2021-01-20 DIAGNOSIS — Z950 Presence of cardiac pacemaker: Secondary | ICD-10-CM

## 2021-01-20 DIAGNOSIS — I442 Atrioventricular block, complete: Secondary | ICD-10-CM

## 2021-01-20 DIAGNOSIS — I4819 Other persistent atrial fibrillation: Secondary | ICD-10-CM

## 2021-01-20 LAB — CUP PACEART INCLINIC DEVICE CHECK
Date Time Interrogation Session: 20220607103526
Implantable Lead Implant Date: 20210707
Implantable Lead Implant Date: 20210707
Implantable Lead Location: 753859
Implantable Lead Location: 753860
Implantable Lead Model: 3830
Implantable Lead Model: 5076
Implantable Pulse Generator Implant Date: 20210707

## 2021-01-20 NOTE — Progress Notes (Signed)
Pacemaker check in clinic. Normal device function. Thresholds, sensing, impedances consistent with previous measurements. Device programmed to maximize longevity by switching off all atrial sensing now s/p AV nodal ablation permanent Atrial fib. Pt VP dependent today. Device programmed at appropriate safety margins. Histogram distribution appropriate for current programming. LRL changed to 70 and Rate response turned on (Now VVIR). Estimated longevity 14 years. Patient enrolled in remote follow-up/TTM's with [employer]. Patient education completed.  Device parameters did not transfer. See attached report.   Keep f/u in 2 weeks with Dr. Lovena Le.

## 2021-02-02 DIAGNOSIS — D510 Vitamin B12 deficiency anemia due to intrinsic factor deficiency: Secondary | ICD-10-CM | POA: Diagnosis not present

## 2021-02-02 DIAGNOSIS — I5030 Unspecified diastolic (congestive) heart failure: Secondary | ICD-10-CM | POA: Diagnosis not present

## 2021-02-02 DIAGNOSIS — J45909 Unspecified asthma, uncomplicated: Secondary | ICD-10-CM | POA: Diagnosis not present

## 2021-02-02 DIAGNOSIS — I480 Paroxysmal atrial fibrillation: Secondary | ICD-10-CM | POA: Diagnosis not present

## 2021-02-02 DIAGNOSIS — I10 Essential (primary) hypertension: Secondary | ICD-10-CM | POA: Diagnosis not present

## 2021-02-04 ENCOUNTER — Other Ambulatory Visit: Payer: Self-pay

## 2021-02-04 ENCOUNTER — Encounter: Payer: Self-pay | Admitting: Internal Medicine

## 2021-02-04 ENCOUNTER — Ambulatory Visit (INDEPENDENT_AMBULATORY_CARE_PROVIDER_SITE_OTHER): Payer: Medicare Other | Admitting: Internal Medicine

## 2021-02-04 VITALS — BP 130/60 | HR 92 | Ht 70.0 in | Wt 269.0 lb

## 2021-02-04 DIAGNOSIS — I442 Atrioventricular block, complete: Secondary | ICD-10-CM | POA: Diagnosis not present

## 2021-02-04 DIAGNOSIS — I4819 Other persistent atrial fibrillation: Secondary | ICD-10-CM

## 2021-02-04 DIAGNOSIS — Z950 Presence of cardiac pacemaker: Secondary | ICD-10-CM | POA: Diagnosis not present

## 2021-02-04 NOTE — Patient Instructions (Signed)
Medication Instructions:  Your physician recommends that you continue on your current medications as directed. Please refer to the Current Medication list given to you today.  Labwork: None ordered.  Testing/Procedures: None ordered.  Follow-Up: Your physician wants you to follow-up in: one year with Cristopher Peru, MD or one of the following Advanced Practice Providers on your designated Care Team:   Chanetta Marshall, NP Tommye Standard, PA-C Legrand Como "Jonni Sanger" [place], [place]  Remote monitoring is used to monitor your Pacemaker from home. This monitoring reduces the number of office visits required to check your device to one time per year. It allows [place] to keep an eye on the functioning of your device to ensure it is working properly. You are scheduled for a device check from home on 02/19/2021. You may send your transmission at any time that day. If you have a wireless device, the transmission will be sent automatically. After your physician reviews your transmission, you will receive a postcard with your next transmission date.  Any Other Special Instructions Will Be Listed Below (If Applicable).  If you need a refill on your cardiac medications before your next appointment, please call your pharmacy.

## 2021-02-04 NOTE — Progress Notes (Signed)
HPI [name] returns today for followup. He has a h/o obesity and persistent atrial flutter/ fib. He has undergone AV node ablation and DDD PM insertion with an atrial lead in the RA and a left bundle area lead. He underwent repeat AV node ablation several weeks ago. He feels well. He has lost weight and trying to lose more. He has not had palpitations. Allergies  Allergen Reactions   Ace Inhibitors Cough   Xarelto [Rivaroxaban] Other (See Comments)    Joint pain     Current Outpatient Medications  Medication Sig Dispense Refill   acetaminophen (TYLENOL) 500 MG tablet Take 1,000 mg by mouth every 6 (six) hours as needed for headache (pain).     amoxicillin (AMOXIL) 500 MG capsule Take 2,000 mg by mouth See admin instructions. Take 4 capsules (2000 mg) by mouth one hour prior to dental appointments     bumetanide (BUMEX) 2 MG tablet Take 1 tablet (2 mg total) by mouth daily. 30 tablet 4   buPROPion (WELLBUTRIN XL) 300 MG 24 hr tablet Take 300 mg by mouth in the morning.     Cholecalciferol (VITAMIN D3) 125 MCG (5000 UT) TABS Take 5,000 Units by mouth in the morning.     clotrimazole (LOTRIMIN) 1 % cream Apply 1 application topically daily as needed (skin rash).     cyanocobalamin (,VITAMIN B-12,) 1000 MCG/ML injection Inject 1,000 mcg into the muscle every 30 (thirty) days.     ELIQUIS 5 MG TABS tablet TAKE ONE TABLET BY MOUTH TWICE DAILY 180 tablet 0   escitalopram (LEXAPRO) 10 MG tablet Take 10 mg by mouth in the morning.     JARDIANCE 10 MG TABS tablet TAKE ONE TABLET BY MOUTH DAILY 86 tablet 4   magnesium oxide (MAG-OX) 400 (241.3 Mg) MG tablet Take 400-800 mg by mouth See admin instructions. Take 2 tablets (800 mg) by mouth in the morning & take 1 tablet (400 mg) by mouth in the evening.     metoprolol tartrate (LOPRESSOR) 50 MG tablet TAKE ONE TABLET BY MOUTH AT BREAKFAST AND AT BEDTIME 180 tablet 0   Multiple Vitamins-Minerals (PRESERVISION AREDS 2 PO) Take 1 capsule by  mouth 2 (two) times a day.     Polyethyl Glycol-Propyl Glycol (SYSTANE ULTRA) 0.4-0.3 % SOLN Place 1 drop into both eyes 3 (three) times daily as needed (dry/irritated eyes).     potassium chloride SA (KLOR-CON) 20 MEQ tablet Take 1 tablet (20 mEq total) by mouth daily. 90 tablet 3   tamsulosin (FLOMAX) 0.4 MG CAPS capsule Take 0.8 mg by mouth at bedtime.      witch hazel-glycerin (TUCKS) pad Apply topically as needed for itching. For external hemorrhoids. Also available OTC. 40 each 12   solifenacin (VESICARE) 10 MG tablet Take 10 mg by mouth daily.     No current facility-administered medications for this visit.     Past Medical History:  Diagnosis Date   Anemia 1980s X 1   Arthritis    "hands, knees, hips, ankles" (07/31/2015)   Atrial flutter ([place]) 07/31/2015   CHF (congestive heart failure) (HCC)    Chronic diastolic heart failure ([place])    a. 11/2015: Echo w/ EF of 60-65%, no WMA, Grade 2 DD, trivial AR, ascending aorta mildly dilated.    Depression    History of cardiovascular stress test    a. 03/2015: Dobutamine Stress Echo with no evidence of ischemia.    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   Past Surgical History:  Procedure Laterality Date   ATRIAL FIBRILLATION ABLATION N/A 06/08/2018   Procedure: ATRIAL FIBRILLATION ABLATION;  Surgeon: Thompson Grayer, MD;  Location: [place] [facility];  Service: Cardiovascular;  Laterality: N/A;   ATRIAL FIBRILLATION ABLATION N/A 12/20/2018   Procedure: ATRIAL FIBRILLATION ABLATION;  Surgeon: Thompson Grayer, MD;  Location: [place] [facility];  Service: Cardiovascular;  Laterality: N/A;   AV NODE ABLATION  02/20/2020   AV NODE ABLATION N/A 02/20/2020   Procedure: AV NODE ABLATION;  Surgeon: Evans Lance, MD;  Location: [place] [facility];  Service: Cardiovascular;  Laterality: N/A;   AV NODE ABLATION N/A 12/29/2020   Procedure: AV NODE ABLATION;  Surgeon: Evans Lance, MD;  Location: [place] [facility];  Service: Cardiovascular;  Laterality: N/A;   CARDIAC CATHETERIZATION  08-12-2003  dr Tressia Miners turner   Normal coronary arteries, normal wall motion, no sig. abnormalities   CARDIOVERSION N/A 08/04/2015   Procedure: CARDIOVERSION;  Surgeon: [name], MD;  Location: [place];  Service: Cardiovascular;  Laterality: N/A;   CARDIOVERSION N/A 10/28/2017   Procedure: CARDIOVERSION;  Surgeon: Larey Dresser, MD;  Location: [facility] ENDOSCOPY;  Service: Cardiovascular;  Laterality: N/A;   CARDIOVERSION N/A 04/12/2018   Procedure: CARDIOVERSION;  Surgeon: Thayer Headings, MD;  Location: [facility] ENDOSCOPY;  Service: Cardiovascular;  Laterality: N/A;   CARDIOVERSION N/A 07/26/2018   Procedure: CARDIOVERSION;  Surgeon: Larey Dresser, MD;  Location: [facility] ENDOSCOPY;  Service: Cardiovascular;  Laterality: N/A;   CARDIOVERSION N/A 10/09/2018   Procedure: CARDIOVERSION;  Surgeon: Sueanne Margarita, MD;  Location: [facility] ENDOSCOPY;  Service: Cardiovascular;  Laterality: N/A;   COLONOSCOPY  07-03-2002   FOREARM FRACTURE SURGERY  1986   FRACTURE SURGERY     GROIN DISSECTION Left 1992    [place]AP[place] Right 1950s?   INSERT / REPLACE / REMOVE PACEMAKER  02/20/2020   INTESTINAL BYPASS  1976   GASTRIC FOR OBESITY   JOINT REPLACEMENT     PACEMAKER IMPLANT N/A 02/20/2020   Procedure: PACEMAKER IMPLANT;  Surgeon: Evans Lance, MD;  Location: [place] [facility];  Service: Cardiovascular;  Laterality: N/A;   PATELLA FRACTURE SURGERY Left ~ 1995   "broke it twice; only had OR once"   PROSTATE BIOPSY  06/2014   RADIOACTIVE SEED IMPLANT N/A 10/17/2014  Procedure: RADIOACTIVE SEED IMPLANT    ;  Surgeon: Ailene Rud, MD;  [facility];  Service: Urology;  Laterality: N/A;   [place] CATH N/A 09/04/2020   Procedure: RIGHT HEART CATH;  Surgeon: Jolaine Artist, MD;  Location: [place] [facility];  Service: Cardiovascular;  Laterality: N/A;   TEE WITHOUT CARDIOVERSION N/A 10/28/2017   Procedure: TRANSESOPHAGEAL ECHOCARDIOGRAM (TEE);  Surgeon: Larey Dresser, MD;  Location: [facility] ENDOSCOPY;  Service: Cardiovascular;  Laterality: N/A;   TONSILLECTOMY  1950s   TOTAL KNEE ARTHROPLASTY Right 12/17/2014   Procedure: TOTAL KNEE ARTHROPLASTY;  Surgeon: Melrose Nakayama, MD;  Location: [place];  Service: Orthopedics;  Laterality: Right;   TRANSTHORACIC ECHOCARDIOGRAM  08-08-2003   moderate LVH/  ef 55-65%/  mild MR/  moderate LAE/  trivial TR/  trivial pericardial effusion posterior to the heart   VENTRAL HERNIA REPAIR  1977   VIDEO BRONCHOSCOPY Bilateral 09/21/2016   Procedure: VIDEO BRONCHOSCOPY WITHOUT FLUORO;  Surgeon: Collene Gobble, MD;  Location: [place];  Service: Cardiopulmonary;  Laterality: Bilateral;     Family History  Problem Relation Age of Onset   Liver disease Mother    Renal Disease Mother    Stroke Mother    Atrial fibrillation Father    Cancer Sister      Social History   Socioeconomic History   Marital status: Married    Spouse name: Not on file   Number of  children: Not on file   Years of education: Not on file   Highest education level: Not on file  Occupational History   Not on file  Tobacco Use   Smoking status: Never   Smokeless tobacco: Never  Vaping Use   Vaping Use: Never used  Substance and Sexual Activity   Alcohol use: No   Drug use: No   Sexual activity: Not Currently  Other Topics Concern   Not on file  Social History Narrative   Lives in [place]   Retired Pharmacist, 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   Social Determinants of Radio broadcast assistant Strain: Not on file  Food Insecurity: Not on file  Transportation Needs: Not on file  Physical Activity: Not on file  Stress: Not on file  Social Connections: Not on file  Intimate Partner Violence: Not on file     BP 130/60   Pulse 92   Ht 5\' 10"  (1.778 m)   Wt 269 lb (122 kg)   SpO2 95%   BMI 38.60 kg/m   Physical Exam:  Well appearing NAD HEENT: Unremarkable Neck:  No JVD, no thyromegally Lymphatics:  No adenopathy Back:  No CVA tenderness Lungs:  Clear with no wheezes HEART:  Regular rate rhythm, no murmurs, no rubs, no clicks Abd:  soft, positive bowel sounds, no organomegally, no rebound, no guarding Ext:  2 plus pulses, no edema, no cyanosis, no clubbing Skin:  No rashes no nodules Neuro:  CN II through XII intact, motor grossly intact  EKG - atrial fib with ventricular pacing  DEVICE  Normal device function.  See PaceArt for details.   Assess/Plan:  Atrial fibrillation - his vr is now controlled. He will continue his current meds s/p AV node ablation. HTN - his bp is minimally elevated. He is encouraged to avoid salty foods. Insomnia - I asked him to try and stay awake a little later  Obesity - I encouraged the  patient to lose weight.  Carleene Overlie Archer Vise,MD

## 2021-02-09 ENCOUNTER — Other Ambulatory Visit: Payer: Self-pay | Admitting: Internal Medicine

## 2021-02-11 ENCOUNTER — Other Ambulatory Visit: Payer: Self-pay | Admitting: Internal Medicine

## 2021-02-19 ENCOUNTER — Ambulatory Visit (INDEPENDENT_AMBULATORY_CARE_PROVIDER_SITE_OTHER): Payer: Medicare Other

## 2021-02-19 DIAGNOSIS — I482 Chronic atrial fibrillation, unspecified: Secondary | ICD-10-CM | POA: Diagnosis not present

## 2021-02-19 DIAGNOSIS — D510 Vitamin B12 deficiency anemia due to intrinsic factor deficiency: Secondary | ICD-10-CM | POA: Diagnosis not present

## 2021-02-19 DIAGNOSIS — M1612 Unilateral primary osteoarthritis, left hip: Secondary | ICD-10-CM | POA: Diagnosis not present

## 2021-02-19 DIAGNOSIS — I10 Essential (primary) hypertension: Secondary | ICD-10-CM | POA: Diagnosis not present

## 2021-02-19 DIAGNOSIS — I442 Atrioventricular block, complete: Secondary | ICD-10-CM

## 2021-02-19 DIAGNOSIS — J45909 Unspecified asthma, uncomplicated: Secondary | ICD-10-CM | POA: Diagnosis not present

## 2021-02-19 DIAGNOSIS — I5030 Unspecified diastolic (congestive) heart failure: Secondary | ICD-10-CM | POA: Diagnosis not present

## 2021-02-19 DIAGNOSIS — M459 Ankylosing spondylitis of unspecified sites in spine: Secondary | ICD-10-CM | POA: Diagnosis not present

## 2021-02-19 DIAGNOSIS — I480 Paroxysmal atrial fibrillation: Secondary | ICD-10-CM | POA: Diagnosis not present

## 2021-02-19 LAB — CUP PACEART REMOTE DEVICE CHECK
Battery Remaining Longevity: 165 mo
Battery Voltage: 3.1 V
Brady Statistic AP VP Percent: 0 %
Brady Statistic AP VS Percent: 0 %
Brady Statistic AS VP Percent: 96.5 %
Brady Statistic AS VS Percent: 3.5 %
Brady Statistic RA Percent Paced: 0 %
Brady Statistic RV Percent Paced: 96.5 %
Date Time Interrogation Session: 20220707051032
Implantable Lead Implant Date: 20210707
Implantable Lead Implant Date: 20210707
Implantable Lead Location: 753859
Implantable Lead Location: 753860
Implantable Lead Model: 3830
Implantable Lead Model: 5076
Implantable Pulse Generator Implant Date: 20210707
Lead Channel Impedance Value: 304 Ohm
Lead Channel Impedance Value: 380 Ohm
Lead Channel Impedance Value: 399 Ohm
Lead Channel Impedance Value: 551 Ohm
Lead Channel Pacing Threshold Amplitude: 0.625 V
Lead Channel Pacing Threshold Pulse Width: 0.4 ms
Lead Channel Sensing Intrinsic Amplitude: 1.5 mV
Lead Channel Sensing Intrinsic Amplitude: 1.5 mV
Lead Channel Sensing Intrinsic Amplitude: 28.375 mV
Lead Channel Sensing Intrinsic Amplitude: 28.375 mV
Lead Channel Setting Pacing Amplitude: 2 V
Lead Channel Setting Pacing Pulse Width: 0.4 ms
Lead Channel Setting Sensing Sensitivity: 1.2 mV

## 2021-03-12 NOTE — Progress Notes (Signed)
Remote pacemaker transmission.   

## 2021-03-27 DIAGNOSIS — M179 Osteoarthritis of knee, unspecified: Secondary | ICD-10-CM | POA: Diagnosis not present

## 2021-03-27 DIAGNOSIS — I480 Paroxysmal atrial fibrillation: Secondary | ICD-10-CM | POA: Diagnosis not present

## 2021-03-27 DIAGNOSIS — M1612 Unilateral primary osteoarthritis, left hip: Secondary | ICD-10-CM | POA: Diagnosis not present

## 2021-03-27 DIAGNOSIS — I10 Essential (primary) hypertension: Secondary | ICD-10-CM | POA: Diagnosis not present

## 2021-03-27 DIAGNOSIS — I5030 Unspecified diastolic (congestive) heart failure: Secondary | ICD-10-CM | POA: Diagnosis not present

## 2021-03-27 DIAGNOSIS — D510 Vitamin B12 deficiency anemia due to intrinsic factor deficiency: Secondary | ICD-10-CM | POA: Diagnosis not present

## 2021-03-27 DIAGNOSIS — I482 Chronic atrial fibrillation, unspecified: Secondary | ICD-10-CM | POA: Diagnosis not present

## 2021-03-27 DIAGNOSIS — J45909 Unspecified asthma, uncomplicated: Secondary | ICD-10-CM | POA: Diagnosis not present

## 2021-03-27 DIAGNOSIS — M459 Ankylosing spondylitis of unspecified sites in spine: Secondary | ICD-10-CM | POA: Diagnosis not present

## 2021-04-16 ENCOUNTER — Other Ambulatory Visit: Payer: Self-pay | Admitting: Internal Medicine

## 2021-05-07 DIAGNOSIS — Z23 Encounter for immunization: Secondary | ICD-10-CM | POA: Diagnosis not present

## 2021-05-07 DIAGNOSIS — R5381 Other malaise: Secondary | ICD-10-CM | POA: Diagnosis not present

## 2021-05-11 ENCOUNTER — Other Ambulatory Visit: Payer: Self-pay | Admitting: Internal Medicine

## 2021-05-12 ENCOUNTER — Telehealth (HOSPITAL_COMMUNITY): Payer: Self-pay | Admitting: *Deleted

## 2021-05-12 DIAGNOSIS — I5032 Chronic diastolic (congestive) heart failure: Secondary | ICD-10-CM

## 2021-05-12 NOTE — Telephone Encounter (Signed)
Spoke with patient about need for follow up appt. He reports that he has been increasingly short of breath over the last month, and he has lost "a significant amount of weight" (wt today 245 lb- previously 269 lb.) He says he is able to ambulate short distances, but is very short of breath afterwards and has to rest. He is taking all medications as prescribed.   Discussed the above with Dr Haroldine Laws. Will bring patient in for labs this week, and follow up appt with Dr Haroldine Laws in the next few weeks.   Lab appt 05/14/21 at 10:00 for CBC, BMET, BNP. Orders placed.  Appt with Dr Haroldine Laws 06/03/21 at 3:20 pm  Pt aware of the above plan. Instructed to call HF clinic if he needs to be seen sooner. He verbalized understanding.   Eric Monte RN [place] Coordinator  Office: [phone]  24/7 Pager: [phone]

## 2021-05-13 ENCOUNTER — Encounter (HOSPITAL_COMMUNITY): Payer: Self-pay

## 2021-05-14 ENCOUNTER — Encounter (HOSPITAL_COMMUNITY): Payer: Self-pay

## 2021-05-14 ENCOUNTER — Ambulatory Visit (HOSPITAL_COMMUNITY)
Admission: RE | Admit: 2021-05-14 | Discharge: 2021-05-14 | Disposition: A | Discharge status: Home / Self Care | Payer: Medicare Other | Source: Ambulatory Visit | Attending: Internal Medicine | Admitting: Internal Medicine

## 2021-05-14 ENCOUNTER — Other Ambulatory Visit: Payer: Self-pay

## 2021-05-14 DIAGNOSIS — M1612 Unilateral primary osteoarthritis, left hip: Secondary | ICD-10-CM | POA: Diagnosis not present

## 2021-05-14 DIAGNOSIS — D510 Vitamin B12 deficiency anemia due to intrinsic factor deficiency: Secondary | ICD-10-CM | POA: Diagnosis not present

## 2021-05-14 DIAGNOSIS — I480 Paroxysmal atrial fibrillation: Secondary | ICD-10-CM | POA: Diagnosis not present

## 2021-05-14 DIAGNOSIS — J45909 Unspecified asthma, uncomplicated: Secondary | ICD-10-CM | POA: Diagnosis not present

## 2021-05-14 DIAGNOSIS — I5030 Unspecified diastolic (congestive) heart failure: Secondary | ICD-10-CM | POA: Diagnosis not present

## 2021-05-14 DIAGNOSIS — I10 Essential (primary) hypertension: Secondary | ICD-10-CM | POA: Diagnosis not present

## 2021-05-14 DIAGNOSIS — I5032 Chronic diastolic (congestive) heart failure: Secondary | ICD-10-CM | POA: Insufficient documentation

## 2021-05-14 DIAGNOSIS — I482 Chronic atrial fibrillation, unspecified: Secondary | ICD-10-CM | POA: Diagnosis not present

## 2021-05-14 DIAGNOSIS — N1831 Chronic kidney disease, stage 3a: Secondary | ICD-10-CM | POA: Diagnosis not present

## 2021-05-14 LAB — BASIC METABOLIC PANEL
Anion gap: 10 (ref 5–15)
BUN: 33 mg/dL — ABNORMAL HIGH (ref 8–23)
CO2: 27 mmol/L (ref 22–32)
Calcium: 8.9 mg/dL (ref 8.9–10.3)
Chloride: 104 mmol/L (ref 98–111)
Creatinine, Ser: 1.46 mg/dL — ABNORMAL HIGH (ref 0.61–1.24)
GFR, Estimated: 49 mL/min — ABNORMAL LOW (ref >60)
Glucose, Bld: 111 mg/dL — ABNORMAL HIGH (ref 70–99)
Potassium: 4.6 mmol/L (ref 3.5–5.1)
Sodium: 141 mmol/L (ref 135–145)

## 2021-05-14 LAB — CBC
HCT: 28.6 % — ABNORMAL LOW (ref 39.0–52.0)
Hemoglobin: 7.3 g/dL — ABNORMAL LOW (ref 13.0–17.0)
MCH: 17.6 pg — ABNORMAL LOW (ref 26.0–34.0)
MCHC: 25.5 g/dL — ABNORMAL LOW (ref 30.0–36.0)
MCV: 68.9 fL — ABNORMAL LOW (ref 80.0–100.0)
Platelets: 157 10*3/uL (ref 150–400)
RBC: 4.15 MIL/uL — ABNORMAL LOW (ref 4.22–5.81)
RDW: 21.2 % — ABNORMAL HIGH (ref 11.5–15.5)
WBC: 5.1 10*3/uL (ref 4.0–10.5)
nRBC: 0 % (ref 0.0–0.2)

## 2021-05-14 LAB — BRAIN NATRIURETIC PEPTIDE: B Natriuretic Peptide: 597.4 pg/mL — ABNORMAL HIGH (ref 0.0–100.0)

## 2021-05-18 DIAGNOSIS — I5030 Unspecified diastolic (congestive) heart failure: Secondary | ICD-10-CM | POA: Diagnosis not present

## 2021-05-18 DIAGNOSIS — D649 Anemia, unspecified: Secondary | ICD-10-CM | POA: Diagnosis not present

## 2021-05-18 DIAGNOSIS — N1831 Chronic kidney disease, stage 3a: Secondary | ICD-10-CM | POA: Diagnosis not present

## 2021-05-21 ENCOUNTER — Ambulatory Visit (INDEPENDENT_AMBULATORY_CARE_PROVIDER_SITE_OTHER): Payer: Medicare Other

## 2021-05-21 DIAGNOSIS — I4819 Other persistent atrial fibrillation: Secondary | ICD-10-CM | POA: Diagnosis not present

## 2021-05-21 LAB — CUP PACEART REMOTE DEVICE CHECK
Battery Remaining Longevity: 158 mo
Battery Voltage: 3.07 V
Brady Statistic AP VP Percent: 0 %
Brady Statistic AP VS Percent: 0 %
Brady Statistic AS VP Percent: 96.91 %
Brady Statistic AS VS Percent: 3.09 %
Brady Statistic RA Percent Paced: 0 %
Brady Statistic RV Percent Paced: 96.91 %
Date Time Interrogation Session: 20221006015718
Implantable Lead Implant Date: 20210707
Implantable Lead Implant Date: 20210707
Implantable Lead Location: 753859
Implantable Lead Location: 753860
Implantable Lead Model: 3830
Implantable Lead Model: 5076
Implantable Pulse Generator Implant Date: 20210707
Lead Channel Impedance Value: 304 Ohm
Lead Channel Impedance Value: 380 Ohm
Lead Channel Impedance Value: 380 Ohm
Lead Channel Impedance Value: 532 Ohm
Lead Channel Pacing Threshold Amplitude: 0.625 V
Lead Channel Pacing Threshold Pulse Width: 0.4 ms
Lead Channel Sensing Intrinsic Amplitude: 1.5 mV
Lead Channel Sensing Intrinsic Amplitude: 1.5 mV
Lead Channel Sensing Intrinsic Amplitude: 20.5 mV
Lead Channel Sensing Intrinsic Amplitude: 20.5 mV
Lead Channel Setting Pacing Amplitude: 2 V
Lead Channel Setting Pacing Pulse Width: 0.4 ms
Lead Channel Setting Sensing Sensitivity: 1.2 mV

## 2021-05-29 ENCOUNTER — Other Ambulatory Visit (HOSPITAL_COMMUNITY): Payer: Self-pay | Admitting: *Deleted

## 2021-05-29 ENCOUNTER — Encounter (HOSPITAL_COMMUNITY): Payer: Self-pay

## 2021-05-29 NOTE — Progress Notes (Signed)
Remote pacemaker transmission.   

## 2021-06-01 ENCOUNTER — Encounter (HOSPITAL_COMMUNITY)
Admission: RE | Admit: 2021-06-01 | Discharge: 2021-06-01 | Disposition: A | Discharge status: Home / Self Care | Payer: Medicare Other | Source: Ambulatory Visit | Attending: Internal Medicine | Admitting: Internal Medicine

## 2021-06-01 ENCOUNTER — Other Ambulatory Visit: Payer: Self-pay

## 2021-06-01 DIAGNOSIS — D509 Iron deficiency anemia, unspecified: Secondary | ICD-10-CM | POA: Insufficient documentation

## 2021-06-01 DIAGNOSIS — I509 Heart failure, unspecified: Secondary | ICD-10-CM | POA: Insufficient documentation

## 2021-06-01 MED ORDER — SODIUM CHLORIDE 0.9 % IV SOLN
510.0000 mg | INTRAVENOUS | Status: DC
  Administered 2021-06-01: 510 mg via INTRAVENOUS
  Filled 2021-06-01: qty 510

## 2021-06-03 ENCOUNTER — Ambulatory Visit (HOSPITAL_COMMUNITY)
Admission: RE | Admit: 2021-06-03 | Discharge: 2021-06-03 | Disposition: A | Discharge status: Home / Self Care | Payer: Medicare Other | Source: Ambulatory Visit | Attending: Internal Medicine | Admitting: Internal Medicine

## 2021-06-03 ENCOUNTER — Other Ambulatory Visit: Payer: Self-pay

## 2021-06-03 VITALS — BP 116/64 | HR 79 | Wt 243.1 lb

## 2021-06-03 DIAGNOSIS — G4733 Obstructive sleep apnea (adult) (pediatric): Secondary | ICD-10-CM | POA: Diagnosis not present

## 2021-06-03 DIAGNOSIS — D500 Iron deficiency anemia secondary to blood loss (chronic): Secondary | ICD-10-CM

## 2021-06-03 DIAGNOSIS — J984 Other disorders of lung: Secondary | ICD-10-CM | POA: Insufficient documentation

## 2021-06-03 DIAGNOSIS — Z79899 Other long term (current) drug therapy: Secondary | ICD-10-CM | POA: Insufficient documentation

## 2021-06-03 DIAGNOSIS — I5032 Chronic diastolic (congestive) heart failure: Secondary | ICD-10-CM

## 2021-06-03 DIAGNOSIS — I4821 Permanent atrial fibrillation: Secondary | ICD-10-CM | POA: Insufficient documentation

## 2021-06-03 DIAGNOSIS — I4819 Other persistent atrial fibrillation: Secondary | ICD-10-CM

## 2021-06-03 DIAGNOSIS — Z6834 Body mass index (BMI) 34.0-34.9, adult: Secondary | ICD-10-CM | POA: Insufficient documentation

## 2021-06-03 DIAGNOSIS — Z950 Presence of cardiac pacemaker: Secondary | ICD-10-CM | POA: Insufficient documentation

## 2021-06-03 DIAGNOSIS — I110 Hypertensive heart disease with heart failure: Secondary | ICD-10-CM | POA: Insufficient documentation

## 2021-06-03 DIAGNOSIS — Z9884 Bariatric surgery status: Secondary | ICD-10-CM | POA: Insufficient documentation

## 2021-06-03 DIAGNOSIS — Z7984 Long term (current) use of oral hypoglycemic drugs: Secondary | ICD-10-CM | POA: Diagnosis not present

## 2021-06-03 DIAGNOSIS — Z7901 Long term (current) use of anticoagulants: Secondary | ICD-10-CM | POA: Diagnosis not present

## 2021-06-03 DIAGNOSIS — Z923 Personal history of irradiation: Secondary | ICD-10-CM | POA: Diagnosis not present

## 2021-06-03 DIAGNOSIS — R0609 Other forms of dyspnea: Secondary | ICD-10-CM | POA: Insufficient documentation

## 2021-06-03 DIAGNOSIS — I442 Atrioventricular block, complete: Secondary | ICD-10-CM

## 2021-06-03 DIAGNOSIS — Z8546 Personal history of malignant neoplasm of prostate: Secondary | ICD-10-CM | POA: Insufficient documentation

## 2021-06-03 DIAGNOSIS — Z9989 Dependence on other enabling machines and devices: Secondary | ICD-10-CM | POA: Insufficient documentation

## 2021-06-03 DIAGNOSIS — D509 Iron deficiency anemia, unspecified: Secondary | ICD-10-CM | POA: Diagnosis not present

## 2021-06-03 NOTE — Patient Instructions (Signed)
Your physician recommends that you schedule a follow-up appointment in: 9 months (July 2023), **PLEASE CALL OUR OFFICE IN MAY TO SCHEDULE THIS APPOINTMENT  If you have any questions or concerns before your next appointment please send [place] a message through [place] or call our office at [phone].    TO LEAVE A MESSAGE FOR THE NURSE SELECT OPTION 2, PLEASE LEAVE A MESSAGE INCLUDING: YOUR NAME DATE OF BIRTH CALL BACK NUMBER REASON FOR CALL**this is important as we prioritize the call backs  YOU WILL RECEIVE A CALL BACK THE SAME DAY AS LONG AS YOU CALL BEFORE 4:00 PM  At the [facility], you and your health needs are our priority. As part of our continuing mission to provide you with exceptional heart care, we have created designated Provider Care Teams. These Care Teams include your primary Cardiologist (physician) and Advanced Practice Providers (APPs- Physician Assistants and Nurse Practitioners) who all work together to provide you with the care you need, when you need it.   You may see any of the following providers on your designated Care Team at your next follow up: Dr Glori Bickers Dr [name] Dr Patrice Paradise, NP Lyda Jester, [place] Ginnie Smart Audry Riles, PharmD   Please be sure to bring in all your medications bottles to every appointment.

## 2021-06-03 NOTE — Progress Notes (Signed)
ADVANCED HF CLINIC NOTE  Referring Physician: Dr. Oval Linsey Primary Care: [name], MD Primary Cardiologist: [name], MD  HPI:  Mr. Staup is a 80 y/o male with morbid obesity s/p gastric bypass 1980s, permanent AF s/p AVN ablation in 5/20 (unable to get CHB) and CRT, HTN, OSA on CPAP, prostate CA and diastolic HF. Referred by Dr. Oval Linsey for further evaluation of his HF and ongoing dyspnea.   Cath 2004 normal coronaries. Myoview 9/19. EF 60% no scar/ischemia  Saw Dr. Lovena Le 10/21 - device interrogation showed only 8% pacing. However 90% of HRs < 100bpm.   PFTs 08/20/16 revealed severe obstruction and likely moderate restriction.  There is also moderate diffusion defect.  He saw Dr. Lamonte Sakai and underwent bronchoscopy on 09/21/16 which showed no intrathoracic obstruction or tracheal abnormality.  Echo 04/2020 EF 60-65% severe LVH.  The septum was 2.5 cm in the posterior wall 2.1 cm.  PYP 10/21 negative  RHC 09/04/20  RA = 3 RV = 38/5 PA = 41/8 (24) PCW = 15 (v = 26) Fick cardiac output/index = 7.6/3.1 Thermo CO/CI = 8.6/3.6 PVR = 1.0 WU FA sat = 97% PA sat = 61%, 62% SVC sat = 61%  Here for f/u. Says fluid is well controlled. Very fatigued. Found to have severe iron deficiency anemia and now receiving IV iron. Not using his foot-pedal bike too much lately due to fatigue. No CP or edema. Going to see PT at [facility]. Denies melena but has heavy hemorrhoidal bleeding. Hs visit with Dr. Laurann Montana tomorrow for lab also has appt to see Dr. Watt Climes in GI. Weight down 20 pounds.    Past Medical History:  Diagnosis Date   Anemia 1980s X 1   Arthritis    "hands, knees, hips, ankles" (07/31/2015)   Atrial flutter ([place]) 07/31/2015   CHF (congestive heart failure) (HCC)    Chronic diastolic heart failure ([place])    a. 11/2015: Echo w/ EF of 60-65%, no WMA, Grade 2 DD, trivial AR, ascending aorta mildly dilated.    Depression    History of cardiovascular stress test    a.  03/2015: Dobutamine Stress Echo with no evidence of ischemia.    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     amoxicillin (AMOXIL) 500 MG capsule Take 2,000 mg by  mouth See admin instructions. Take 4 capsules (2000 mg) by mouth one hour prior to dental appointments     bumetanide (BUMEX) 2 MG tablet TAKE ONE TABLET BY MOUTH EVERY MORNING 30 tablet 6   Cholecalciferol (VITAMIN D3) 125 MCG (5000 UT) TABS Take 5,000 Units by mouth in the morning.     clotrimazole (LOTRIMIN) 1 % cream Apply 1 application topically daily as needed (skin rash).     cyanocobalamin (,VITAMIN B-12,) 1000 MCG/ML injection Inject 1,000 mcg into the muscle every 30 (thirty) days.     ELIQUIS 5 MG TABS tablet TAKE ONE TABLET BY MOUTH AT BREAKFAST AND AT BEDTIME 180 tablet 3   escitalopram (LEXAPRO) 20 MG tablet Take 20 mg by mouth daily.     JARDIANCE 10 MG TABS tablet TAKE ONE TABLET BY MOUTH DAILY 86 tablet 4   magnesium oxide (MAG-OX) 400 (241.3 Mg) MG tablet Take  400-800 mg by mouth See admin instructions. Take 2 tablets (800 mg) by mouth in the morning & take 1 tablet (400 mg) by mouth in the evening.     metoprolol tartrate (LOPRESSOR) 50 MG tablet TAKE ONE TABLET BY MOUTH AT BREAKFAST AND AT BEDTIME 180 tablet 2   Multiple Vitamins-Minerals (PRESERVISION AREDS 2 PO) Take 1 capsule by mouth 2 (two) times a day.     Polyethyl Glycol-Propyl Glycol (SYSTANE ULTRA) 0.4-0.3 % SOLN Place 1 drop into both eyes 3 (three) times daily as needed (dry/irritated eyes).     potassium chloride SA (KLOR-CON) 20 MEQ tablet Take 1 tablet (20 mEq total) by mouth daily. 90 tablet 3   solifenacin (VESICARE) 10 MG tablet Take 10 mg by mouth daily.     tamsulosin (FLOMAX) 0.4 MG CAPS capsule Take 0.8 mg by mouth at bedtime.      witch hazel-glycerin (TUCKS) pad Apply topically as needed for itching. For external hemorrhoids. Also available OTC. 40 each 12   No current facility-administered medications for this encounter.    Allergies  Allergen Reactions   Ace Inhibitors Cough   Xarelto [Rivaroxaban] Other (See Comments)    Joint pain      Social History   Socioeconomic History   Marital status: Married    Spouse name: Not on file   Number of children: Not on file   Years of education: Not on file   Highest education level: Not on file  Occupational History   Not on file  Tobacco Use   Smoking status: Never   Smokeless tobacco: Never  Vaping Use   Vaping Use: Never used  Substance and Sexual Activity   Alcohol use: No   Drug use: No   Sexual activity: Not Currently  Other Topics Concern   Not on file  Social History Narrative   Lives in [place]   Retired Pharmacist, 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   Social Determinants of Health   Financial Resource Strain: Not on file  Food Insecurity: Not on file  Transportation Needs: Not on file  Physical Activity: Not on file  Stress: Not on file  Social Connections: Not on file   Intimate Partner Violence: Not on file      Family History  Problem Relation Age of Onset   Liver disease Mother    Renal Disease Mother    Stroke Mother    Atrial fibrillation Father    Cancer Sister     Vitals:   06/03/21 1533  BP: 116/64  Pulse: 79  SpO2: 95%  Weight: 110.3 kg (243 lb 2 oz)   Wt Readings from Last 3 Encounters:  06/03/21 110.3 kg (243 lb 2 oz)  06/01/21 109.8 kg (242 lb)  02/04/21 122 kg (269 lb)     PHYSICAL EXAM: General:  Well appearing. No resp difficulty HEENT: normal pale sclera Neck: supple. no JVD. Carotids 2+ bilat; no bruits. No lymphadenopathy or thryomegaly appreciated. Cor: PMI nondisplaced. Regular rate & rhythm. No rubs, gallops or murmurs. Lungs: clear Abdomen: obese soft, nontender, nondistended. No hepatosplenomegaly. No bruits or masses. Good bowel sounds. Extremities: no cyanosis, clubbing, rash, tr edema Neuro: alert & orientedx3, cranial nerves grossly intact. moves all 4 extremities w/o difficulty. Affect pleasant   ASSESSMENT & PLAN:  1. Chronic dyspnea - suspect this is multifactorial (HF, AF, obesity, deconditioning, restrictive lung physiology) - RHC 1/22 confirmed relatively normal filling pressures but did have elevated v-waves in PCWP tracing. Could consider exercise testing with invasive monitoring - Overall improving. Volume status well controlled.  - Fe-deficient anemia main issue currently  2. Chronic diastolic HF - Echo 7/97 EF 60 to 65% with severe LVH.  The septum was 2.5 cm in the posterior wall 2.1 cm.RV ok - PYP 10/21 negative - Volume status looks good today  - RHC 1/22 confirmed relatively normal filling pressures but did have elevated v-waves in PCWP tracing.  - On Bumex 2mg  daily - Continue Jardiance 10 - Continue metoprolol 25 bid - Entresto stopped due to low BP - No change today  3. Permanent AF -  permanent AF s/p AVN ablation in 5/20 (unable to get CHB) and CRT, - followed by Dr. Lovena Le.  Underwent repeat AVN ablation in 5/22 which was successful  - On Eliquis. Having heavy hemorrhoidal bleeding. May need to hold for a bit  4. OSA on CPAP - compliant with CPAP  5. Restrictive lung disease - PFTs 5/21: FEV1 1.66 (59%) FVC 2.28 (59%) DLCO 69%  6. Morbid obesity - s/p gastric bypass - Body mass index is 34.88 kg/m.  - has lost > 20 pounds recently. Great job!  7. Fe-deficient anemia - recent hgb 7.3 - ? Due to hemorrhoidal bleeding - managed by PCP and has visit scheduled with GI  Glori Bickers, MD  3:51 PM

## 2021-06-04 ENCOUNTER — Encounter (HOSPITAL_COMMUNITY)
Admission: RE | Admit: 2021-06-04 | Discharge: 2021-06-04 | Disposition: A | Discharge status: Home / Self Care | Payer: Medicare Other | Source: Ambulatory Visit | Attending: Internal Medicine | Admitting: Internal Medicine

## 2021-06-04 DIAGNOSIS — D500 Iron deficiency anemia secondary to blood loss (chronic): Secondary | ICD-10-CM | POA: Diagnosis not present

## 2021-06-04 DIAGNOSIS — I5030 Unspecified diastolic (congestive) heart failure: Secondary | ICD-10-CM | POA: Diagnosis not present

## 2021-06-04 DIAGNOSIS — Z0000 Encounter for general adult medical examination without abnormal findings: Secondary | ICD-10-CM | POA: Diagnosis not present

## 2021-06-04 DIAGNOSIS — D6869 Other thrombophilia: Secondary | ICD-10-CM | POA: Diagnosis not present

## 2021-06-04 DIAGNOSIS — N1831 Chronic kidney disease, stage 3a: Secondary | ICD-10-CM | POA: Diagnosis not present

## 2021-06-04 DIAGNOSIS — Z86010 Personal history of colonic polyps: Secondary | ICD-10-CM | POA: Diagnosis not present

## 2021-06-04 DIAGNOSIS — I10 Essential (primary) hypertension: Secondary | ICD-10-CM | POA: Diagnosis not present

## 2021-06-04 DIAGNOSIS — D509 Iron deficiency anemia, unspecified: Secondary | ICD-10-CM | POA: Diagnosis not present

## 2021-06-04 DIAGNOSIS — Z1389 Encounter for screening for other disorder: Secondary | ICD-10-CM | POA: Diagnosis not present

## 2021-06-04 DIAGNOSIS — M459 Ankylosing spondylitis of unspecified sites in spine: Secondary | ICD-10-CM | POA: Diagnosis not present

## 2021-06-04 DIAGNOSIS — G4733 Obstructive sleep apnea (adult) (pediatric): Secondary | ICD-10-CM | POA: Diagnosis not present

## 2021-06-04 DIAGNOSIS — I482 Chronic atrial fibrillation, unspecified: Secondary | ICD-10-CM | POA: Diagnosis not present

## 2021-06-04 DIAGNOSIS — I509 Heart failure, unspecified: Secondary | ICD-10-CM | POA: Diagnosis not present

## 2021-06-04 MED ORDER — SODIUM CHLORIDE 0.9 % IV SOLN
510.0000 mg | INTRAVENOUS | Status: DC
  Administered 2021-06-04: 510 mg via INTRAVENOUS
  Filled 2021-06-04: qty 510

## 2021-06-04 MED ORDER — SODIUM CHLORIDE 0.9 % IV SOLN
510.0000 mg | INTRAVENOUS | Status: DC
  Filled 2021-06-04: qty 17

## 2021-06-12 DIAGNOSIS — D500 Iron deficiency anemia secondary to blood loss (chronic): Secondary | ICD-10-CM | POA: Diagnosis not present

## 2021-06-12 DIAGNOSIS — M459 Ankylosing spondylitis of unspecified sites in spine: Secondary | ICD-10-CM | POA: Diagnosis not present

## 2021-06-12 DIAGNOSIS — J45909 Unspecified asthma, uncomplicated: Secondary | ICD-10-CM | POA: Diagnosis not present

## 2021-06-12 DIAGNOSIS — I482 Chronic atrial fibrillation, unspecified: Secondary | ICD-10-CM | POA: Diagnosis not present

## 2021-06-12 DIAGNOSIS — N1831 Chronic kidney disease, stage 3a: Secondary | ICD-10-CM | POA: Diagnosis not present

## 2021-06-12 DIAGNOSIS — I5030 Unspecified diastolic (congestive) heart failure: Secondary | ICD-10-CM | POA: Diagnosis not present

## 2021-06-12 DIAGNOSIS — I480 Paroxysmal atrial fibrillation: Secondary | ICD-10-CM | POA: Diagnosis not present

## 2021-06-12 DIAGNOSIS — I10 Essential (primary) hypertension: Secondary | ICD-10-CM | POA: Diagnosis not present

## 2021-06-19 DIAGNOSIS — D500 Iron deficiency anemia secondary to blood loss (chronic): Secondary | ICD-10-CM | POA: Diagnosis not present

## 2021-06-30 ENCOUNTER — Telehealth (HOSPITAL_COMMUNITY): Payer: Self-pay | Admitting: *Deleted

## 2021-06-30 NOTE — Telephone Encounter (Signed)
Received fax from [facility], pt needs clearance for endoscopy and to hold Eliquis prior, procedure sch for 11/16  Per Dr Haroldine Laws: ok to hold eliquis today and tomorrow, restart 11/17  Note faxed back to them at [phone]

## 2021-07-01 DIAGNOSIS — K2950 Unspecified chronic gastritis without bleeding: Secondary | ICD-10-CM | POA: Diagnosis not present

## 2021-07-01 DIAGNOSIS — K2289 Other specified disease of esophagus: Secondary | ICD-10-CM | POA: Diagnosis not present

## 2021-07-01 DIAGNOSIS — K449 Diaphragmatic hernia without obstruction or gangrene: Secondary | ICD-10-CM | POA: Diagnosis not present

## 2021-07-01 DIAGNOSIS — D509 Iron deficiency anemia, unspecified: Secondary | ICD-10-CM | POA: Diagnosis not present

## 2021-07-03 ENCOUNTER — Other Ambulatory Visit: Payer: Self-pay | Admitting: Gastroenterology

## 2021-07-14 DIAGNOSIS — I480 Paroxysmal atrial fibrillation: Secondary | ICD-10-CM | POA: Diagnosis not present

## 2021-07-14 DIAGNOSIS — I10 Essential (primary) hypertension: Secondary | ICD-10-CM | POA: Diagnosis not present

## 2021-07-14 DIAGNOSIS — I482 Chronic atrial fibrillation, unspecified: Secondary | ICD-10-CM | POA: Diagnosis not present

## 2021-07-14 DIAGNOSIS — J45909 Unspecified asthma, uncomplicated: Secondary | ICD-10-CM | POA: Diagnosis not present

## 2021-07-14 DIAGNOSIS — N1831 Chronic kidney disease, stage 3a: Secondary | ICD-10-CM | POA: Diagnosis not present

## 2021-07-14 DIAGNOSIS — D500 Iron deficiency anemia secondary to blood loss (chronic): Secondary | ICD-10-CM | POA: Diagnosis not present

## 2021-07-16 ENCOUNTER — Encounter (HOSPITAL_COMMUNITY): Payer: Self-pay | Admitting: Gastroenterology

## 2021-07-28 ENCOUNTER — Encounter (HOSPITAL_COMMUNITY)
Admission: RE | Disposition: A | Discharge status: Home / Self Care | Payer: Self-pay | Source: Home / Self Care | Attending: Gastroenterology

## 2021-07-28 ENCOUNTER — Encounter (HOSPITAL_COMMUNITY): Payer: Self-pay | Admitting: Gastroenterology

## 2021-07-28 ENCOUNTER — Ambulatory Visit (HOSPITAL_COMMUNITY)
Admission: RE | Admit: 2021-07-28 | Discharge: 2021-07-28 | Disposition: A | Discharge status: Home / Self Care | Payer: Medicare Other | Attending: Gastroenterology | Admitting: Gastroenterology

## 2021-07-28 ENCOUNTER — Ambulatory Visit (HOSPITAL_COMMUNITY): Payer: Medicare Other | Admitting: Anesthesiology

## 2021-07-28 ENCOUNTER — Other Ambulatory Visit: Payer: Self-pay

## 2021-07-28 DIAGNOSIS — D500 Iron deficiency anemia secondary to blood loss (chronic): Secondary | ICD-10-CM | POA: Insufficient documentation

## 2021-07-28 DIAGNOSIS — Z980 Intestinal bypass and anastomosis status: Secondary | ICD-10-CM | POA: Insufficient documentation

## 2021-07-28 DIAGNOSIS — K921 Melena: Secondary | ICD-10-CM | POA: Diagnosis not present

## 2021-07-28 DIAGNOSIS — K648 Other hemorrhoids: Secondary | ICD-10-CM | POA: Insufficient documentation

## 2021-07-28 DIAGNOSIS — D124 Benign neoplasm of descending colon: Secondary | ICD-10-CM | POA: Diagnosis not present

## 2021-07-28 DIAGNOSIS — K635 Polyp of colon: Secondary | ICD-10-CM | POA: Diagnosis not present

## 2021-07-28 DIAGNOSIS — D122 Benign neoplasm of ascending colon: Secondary | ICD-10-CM | POA: Insufficient documentation

## 2021-07-28 DIAGNOSIS — Z1211 Encounter for screening for malignant neoplasm of colon: Secondary | ICD-10-CM | POA: Diagnosis not present

## 2021-07-28 DIAGNOSIS — D120 Benign neoplasm of cecum: Secondary | ICD-10-CM | POA: Insufficient documentation

## 2021-07-28 DIAGNOSIS — D490 Neoplasm of unspecified behavior of digestive system: Secondary | ICD-10-CM | POA: Diagnosis not present

## 2021-07-28 DIAGNOSIS — D123 Benign neoplasm of transverse colon: Secondary | ICD-10-CM | POA: Diagnosis not present

## 2021-07-28 DIAGNOSIS — Z86010 Personal history of colonic polyps: Secondary | ICD-10-CM | POA: Diagnosis not present

## 2021-07-28 HISTORY — PX: BIOPSY: SHX5522

## 2021-07-28 HISTORY — PX: POLYPECTOMY: SHX5525

## 2021-07-28 HISTORY — PX: COLONOSCOPY WITH PROPOFOL: SHX5780

## 2021-07-28 SURGERY — COLONOSCOPY WITH PROPOFOL
Anesthesia: Monitor Anesthesia Care

## 2021-07-28 MED ORDER — PROPOFOL 10 MG/ML IV BOLUS
INTRAVENOUS | Status: DC | PRN
  Administered 2021-07-28 (×3): 20 mg via INTRAVENOUS

## 2021-07-28 MED ORDER — PROPOFOL 500 MG/50ML IV EMUL
INTRAVENOUS | Status: AC
  Filled 2021-07-28: qty 50

## 2021-07-28 MED ORDER — SODIUM CHLORIDE 0.9 % IV SOLN: INTRAVENOUS | Status: DC

## 2021-07-28 MED ORDER — PHENYLEPHRINE 40 MCG/ML (10ML) SYRINGE FOR IV PUSH (FOR BLOOD PRESSURE SUPPORT)
PREFILLED_SYRINGE | INTRAVENOUS | Status: DC | PRN
  Administered 2021-07-28: 120 ug via INTRAVENOUS
  Administered 2021-07-28 (×2): 80 ug via INTRAVENOUS
  Administered 2021-07-28: 120 ug via INTRAVENOUS
  Administered 2021-07-28 (×3): 80 ug via INTRAVENOUS

## 2021-07-28 MED ORDER — PROPOFOL 10 MG/ML IV BOLUS
INTRAVENOUS | Status: AC
  Filled 2021-07-28: qty 20

## 2021-07-28 MED ORDER — LIDOCAINE 2% (20 MG/ML) 5 ML SYRINGE
INTRAMUSCULAR | Status: DC | PRN
  Administered 2021-07-28: 100 mg via INTRAVENOUS

## 2021-07-28 MED ORDER — LACTATED RINGERS IV SOLN: INTRAVENOUS | Status: DC

## 2021-07-28 MED ORDER — PROPOFOL 500 MG/50ML IV EMUL
INTRAVENOUS | Status: DC | PRN
  Administered 2021-07-28: 100 ug/kg/min via INTRAVENOUS

## 2021-07-28 SURGICAL SUPPLY — 22 items

## 2021-07-28 NOTE — Discharge Instructions (Addendum)
YOU HAD AN ENDOSCOPIC PROCEDURE TODAY: Refer to the procedure report and other information in the discharge instructions given to you for any specific questions about what was found during the examination. If this information does not answer your questions, please call [facility] office at [phone] to clarify.   YOU SHOULD EXPECT: Some feelings of bloating in the abdomen. Passage of more gas than usual. Walking can help get rid of the air that was put into your GI tract during the procedure and reduce the bloating. If you had a lower endoscopy (such as a colonoscopy or flexible sigmoidoscopy) you may notice spotting of blood in your stool or on the toilet paper. Some abdominal soreness may be present for a day or two, also.  DIET: Your first meal following the procedure should be a light meal and then it is ok to progress to your normal diet. A half-sandwich or bowl of soup is an example of a good first meal. Heavy or fried foods are harder to digest and may make you feel nauseous or bloated. Drink plenty of fluids but you should avoid alcoholic beverages for 24 hours. If you had a esophageal dilation, please see attached instructions for diet.    ACTIVITY: Your care partner should take you home directly after the procedure. You should plan to take it easy, moving slowly for the rest of the day. You can resume normal activity the day after the procedure however YOU SHOULD NOT DRIVE, use power tools, machinery or perform tasks that involve climbing or major physical exertion for 24 hours (because of the sedation medicines used during the test).   SYMPTOMS TO REPORT IMMEDIATELY: A gastroenterologist can be reached at any hour. Please call [phone]  for any of the following symptoms:  . Following lower endoscopy (colonoscopy, flexible sigmoidoscopy) Excessive amounts of blood in the stool  Significant tenderness, worsening of abdominal pains  Swelling of the abdomen that is new, acute  Fever of 100  or higher  . Following upper endoscopy (EGD, EUS, ERCP, esophageal dilation) Vomiting of blood or coffee ground material  New, significant abdominal pain  New, significant chest pain or pain under the shoulder blades  Painful or persistently difficult swallowing  New shortness of breath  Black, tarry-looking or red, bloody stools  FOLLOW UP:  If any biopsies were taken you will be contacted by phone or by letter within the next 1-3 weeks. Call [phone]  if you have not heard about the biopsies in 3 weeks.  Please also call with any specific questions about appointments or follow up tests. YOU HAD AN ENDOSCOPIC PROCEDURE TODAY: Refer to the procedure report and other information in the discharge instructions given to you for any specific questions about what was found during the examination. If this information does not answer your questions, please call [facility] office at [phone] to clarify.   YOU SHOULD EXPECT: Some feelings of bloating in the abdomen. Passage of more gas than usual. Walking can help get rid of the air that was put into your GI tract during the procedure and reduce the bloating. If you had a lower endoscopy (such as a colonoscopy or flexible sigmoidoscopy) you may notice spotting of blood in your stool or on the toilet paper. Some abdominal soreness may be present for a day or two, also.  DIET: Your first meal following the procedure should be a light meal and then it is ok to progress to your normal diet. A half-sandwich or bowl of soup   example of a good first meal. Heavy or fried foods are harder to digest and may make you feel nauseous or bloated. Drink plenty of fluids but you should avoid alcoholic beverages for 24 hours. If you had a esophageal dilation, please see attached instructions for diet.    ACTIVITY: Your care partner should take you home directly after the procedure. You should plan to take it easy, moving slowly for the rest of the day. You can resume  normal activity the day after the procedure however YOU SHOULD NOT DRIVE, use power tools, machinery or perform tasks that involve climbing or major physical exertion for 24 hours (because of the sedation medicines used during the test).   SYMPTOMS TO REPORT IMMEDIATELY: A gastroenterologist can be reached at any hour. Please call [phone]  for any of the following symptoms:  Following lower endoscopy (colonoscopy, flexible sigmoidoscopy) Excessive amounts of blood in the stool  Significant tenderness, worsening of abdominal pains  Swelling of the abdomen that is new, acute  Fever of 100 or higher  Following upper endoscopy (EGD, EUS, ERCP, esophageal dilation) Vomiting of blood or coffee ground material  New, significant abdominal pain  New, significant chest pain or pain under the shoulder blades  Painful or persistently difficult swallowing  New shortness of breath  Black, tarry-looking or red, bloody stools  FOLLOW UP: Call if question or problem otherwise call for biopsy report in 1 week and will probably need surgical consultation cardiology consultation and CT scan just to be sure If any biopsies were taken you will be contacted by phone or by letter within the next 1-3 weeks. Call [phone]  if you have not heard about the biopsies in 3 weeks.  Please also call with any specific questions about appointments or follow up tests.  And call your cardiologist tomorrow and ask if you can stay off your Eliquis for 5 more days

## 2021-07-28 NOTE — Op Note (Signed)
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 Estimated Blood Loss:     Estimated blood loss was minimal. Procedure:                Pre-Anesthesia Assessment:                           - Prior to the procedure, a History and Physical                            was performed, and patient medications and                            allergies were reviewed. The patient's tolerance of                            previous anesthesia was also reviewed. The risks                            and benefits of the procedure and the sedation                            options and risks were discussed with the patient.                            All questions were answered, and informed consent                            was obtained. Prior Anticoagulants: The patient has  taken Eliquis (apixaban), last dose was 3 days                            prior to procedure. ASA Grade Assessment: III - A                            patient with severe systemic disease. After                            reviewing the risks and benefits, the patient was                            deemed in satisfactory condition to undergo the                             procedure.                           After obtaining informed consent, the colonoscope                            was passed under direct vision. Throughout the                            procedure, the patient's blood pressure, pulse, and                            oxygen saturations were monitored continuously. The                            PCF-HQ190L ([id number]) Olympus colonoscope was                            introduced through the anus and advanced to the the                            cecum, identified by appendiceal orifice and                            ileocecal valve. The ileocecal valve, appendiceal                            orifice, and rectum were photographed. The                            colonoscopy was technically difficult and complex                            due to abnormal anatomy, restricted mobility of the                            colon, a redundant colon, significant looping and a  tortuous colon. Successful completion of the                            procedure was aided by applying abdominal pressure.                            The patient tolerated the procedure well. The                            quality of the bowel preparation was adequate. Scope In: 12:23:32 PM Scope Out: 1:26:56 PM Scope Withdrawal Time: 0 hours 48 minutes 33 seconds  Total Procedure Duration: 1 hour 3 minutes 24 seconds  Findings:      External and internal hemorrhoids were found during retroflexion, during       perianal exam and during digital exam. The hemorrhoids were small.      Anal papilla(e) were hypertrophied.      Nine pedunculated and semi-sessile and semi-pedunculated polyps were       found in the descending colon, transverse colon and ascending colon. The       polyps were medium in size. These polyps were removed with a hot snare.       Resection and retrieval were complete.      Four semi-sessile polyps were found in the descending colon,  transverse       colon and ascending colon. The polyps were small in size. These polyps       were removed with a cold snare. Resection and retrieval were complete.      There was evidence of a prior end-to-end ileo-colonic anastomosis in the       mid transverse colon. This was patent and was characterized by healthy       appearing mucosa. The anastomosis was traversed.      The ileal surgical anastomosis appeared normal.      A polypoid non-obstructing medium-sized mass was found in the cecum. The       mass was partially circumferential (involving one-third of the lumen       circumference). In addition, its diameter measured three mm. Oozing was       present. Biopsies were taken with a cold forceps for histology.      The exam was otherwise without abnormality. Impression:               - External and internal hemorrhoids.                           - Anal papilla(e) were hypertrophied.                           - Nine medium polyps in the descending colon, in                            the transverse colon and in the ascending colon,                            removed with a hot snare. Resected and retrieved.                           -  Four small polyps in the descending colon, in the                            transverse colon and in the ascending colon,                            removed with a cold snare. Resected and retrieved.                           - Patent end-to-end ileo-colonic anastomosis,                            characterized by healthy appearing mucosa.                           - The examined portion of the ileum was normal.                           - Rule out malignancy, tumor in the cecum. Biopsied.                           - The examination was otherwise normal. Moderate Sedation:      Not Applicable - Patient had care per Anesthesia. Recommendation:           - Soft diet today.                           - Continue present medications.                            - Resume Eliquis (apixaban) at prior dose in 5 days                            if okay with cardiology.                           - Await pathology results.                           - Repeat colonoscopy date to be determined after                            pending pathology results are reviewed for                            surveillance based on pathology results.                           - Return to GI office PRN.                           - Telephone GI clinic for pathology results in 1                            week.                           -  Telephone GI clinic if symptomatic PRN.                           - Perform a CT scan (computed tomography) of                            abdomen with contrast and pelvis with contrast if                            BUN and creatinine okay at appointment to be                            scheduled.                           - Refer to a colo-rectal surgeon at appointment to                            be scheduled. Procedure Code(s):        --- Professional ---                           [phone], Colonoscopy, flexible; with removal of                            tumor(s), polyp(s), or other lesion(s) by snare                            technique                           45380, 7, Colonoscopy, flexible; with biopsy,                            single or multiple Diagnosis Code(s):        --- Professional ---                           K63.5, Polyp of colon                           Z98.0, Intestinal bypass and anastomosis status                           K64.8, Other hemorrhoids                           D49.0, 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 All rights reserved. The codes documented in this report are preliminary and upon coder review may  be revised to meet current compliance requirements. [name], MD 07/28/2021 1:49:39 PM This report has  been signed electronically. Number of Addenda: 0

## 2021-07-28 NOTE — Transfer of Care (Signed)
Immediate Anesthesia Transfer of Care Note  Patient: Eric Mayer  Procedure(s) Performed: COLONOSCOPY WITH PROPOFOL POLYPECTOMY BIOPSY  Patient Location: Endoscopy Unit  Anesthesia Type:MAC  Level of Consciousness: drowsy  Airway & Oxygen Therapy: Patient Spontanous Breathing and Patient connected to face mask oxygen  Post-op Assessment: Report given to RN and Post -op Vital signs reviewed and stable  Post vital signs: Reviewed and stable  Last Vitals:  Vitals Value Taken Time  BP    Temp    Pulse 69 07/28/21 1333  Resp 16 07/28/21 1333  SpO2 100 % 07/28/21 1333  Vitals shown include unvalidated device data.  Last Pain:  Vitals:   07/28/21 1053  TempSrc: Oral  PainSc: 0-No pain         Complications: No notable events documented.

## 2021-07-28 NOTE — Anesthesia Preprocedure Evaluation (Addendum)
Anesthesia Evaluation  Patient identified by MRN, date of birth, ID band Patient awake    Reviewed: Allergy & Precautions, NPO status , Patient's Chart, lab work & pertinent test results  Airway Mallampati: I  TM Distance: >3 FB Neck ROM: Full    Dental no notable dental hx. (+) Teeth Intact, Dental Advisory Given   Pulmonary asthma , sleep apnea and Continuous Positive Airway Pressure Ventilation ,    Pulmonary exam normal breath sounds clear to auscultation       Cardiovascular hypertension, Pt. on medications +CHF  Normal cardiovascular exam+ dysrhythmias Atrial Fibrillation + pacemaker  Rhythm:Regular Rate:Normal  TTE 2021 1. Left ventricular ejection fraction, by estimation, is 60 to 65%. The  left ventricle has normal function. The left ventricle has no regional  wall motion abnormalities. There is severe eccentric left ventricular  hypertrophy. Left ventricular diastolic  function could not be evaluated.  2. Right ventricular systolic function is normal. The right ventricular  size is normal.  3. Left atrial size was moderately dilated.  4. The mitral valve is normal in structure. Mild mitral valve  regurgitation.  5. The aortic valve is tricuspid. There is mild calcification of the  aortic valve. There is mild thickening of the aortic valve. Aortic valve  regurgitation is trivial. No aortic stenosis is present.  6. Aortic dilatation noted. There is mild dilatation of the ascending  aorta, measuring 41 mm.   Stress Test 2019 Soft tissue attenuation of anterior wall No ischemia or infarction Estimated EF 60%   Neuro/Psych negative neurological ROS  negative psych ROS   GI/Hepatic negative GI ROS, Neg liver ROS,   Endo/Other    Renal/GU Renal InsufficiencyRenal disease  negative genitourinary   Musculoskeletal  (+) Arthritis ,   Abdominal   Peds  Hematology  (+) Blood dyscrasia (on eliquis), anemia  ,   Anesthesia Other Findings   Reproductive/Obstetrics                            Anesthesia Physical Anesthesia Plan  ASA: 3  Anesthesia Plan: MAC   Post-op Pain Management:    Induction: Intravenous  PONV Risk Score and Plan: 1 and Propofol infusion and Treatment may vary due to age or medical condition  Airway Management Planned: Natural Airway  Additional Equipment:   Intra-op Plan:   Post-operative Plan:   Informed Consent: I have reviewed the patients History and Physical, chart, labs and discussed the procedure including the risks, benefits and alternatives for the proposed anesthesia with the patient or authorized representative who has indicated his/her understanding and acceptance.     Dental advisory given  Plan Discussed with: CRNA  Anesthesia Plan Comments:         Anesthesia Quick Evaluation

## 2021-07-28 NOTE — Anesthesia Postprocedure Evaluation (Signed)
Anesthesia Post Note  Patient: Eric Mayer  Procedure(s) Performed: COLONOSCOPY WITH PROPOFOL POLYPECTOMY BIOPSY     Patient location during evaluation: PACU Anesthesia Type: MAC Level of consciousness: awake and alert Pain management: pain level controlled Vital Signs Assessment: post-procedure vital signs reviewed and stable Respiratory status: spontaneous breathing, nonlabored ventilation, respiratory function stable and patient connected to nasal cannula oxygen Cardiovascular status: stable and blood pressure returned to baseline Postop Assessment: no apparent nausea or vomiting Anesthetic complications: no   No notable events documented.  Last Vitals:  Vitals:   07/28/21 1341 07/28/21 1351  BP: (!) 132/59 137/62  Pulse: 71 74  Resp: 19 (!) 23  Temp:    SpO2: 98% 96%    Last Pain:  Vitals:   07/28/21 1351  TempSrc:   PainSc: 0-No pain                 Labria Wos L Kamarion Zagami

## 2021-07-28 NOTE — Progress Notes (Signed)
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

## 2021-07-28 NOTE — Anesthesia Procedure Notes (Signed)
Date/Time: 07/28/2021 12:14 PM Performed by: Sharlette Dense, CRNA Oxygen Delivery Method: Simple face mask

## 2021-07-29 ENCOUNTER — Encounter (HOSPITAL_COMMUNITY): Payer: Self-pay | Admitting: Gastroenterology

## 2021-07-29 LAB — SURGICAL PATHOLOGY

## 2021-08-13 DIAGNOSIS — I482 Chronic atrial fibrillation, unspecified: Secondary | ICD-10-CM | POA: Diagnosis not present

## 2021-08-13 DIAGNOSIS — J45909 Unspecified asthma, uncomplicated: Secondary | ICD-10-CM | POA: Diagnosis not present

## 2021-08-13 DIAGNOSIS — D509 Iron deficiency anemia, unspecified: Secondary | ICD-10-CM | POA: Diagnosis not present

## 2021-08-13 DIAGNOSIS — I5030 Unspecified diastolic (congestive) heart failure: Secondary | ICD-10-CM | POA: Diagnosis not present

## 2021-08-13 DIAGNOSIS — I10 Essential (primary) hypertension: Secondary | ICD-10-CM | POA: Diagnosis not present

## 2021-08-13 DIAGNOSIS — N1831 Chronic kidney disease, stage 3a: Secondary | ICD-10-CM | POA: Diagnosis not present

## 2021-08-18 DIAGNOSIS — R0602 Shortness of breath: Secondary | ICD-10-CM | POA: Diagnosis not present

## 2021-08-18 DIAGNOSIS — R109 Unspecified abdominal pain: Secondary | ICD-10-CM | POA: Diagnosis not present

## 2021-08-18 DIAGNOSIS — D500 Iron deficiency anemia secondary to blood loss (chronic): Secondary | ICD-10-CM | POA: Diagnosis not present

## 2021-08-18 DIAGNOSIS — K649 Unspecified hemorrhoids: Secondary | ICD-10-CM | POA: Diagnosis not present

## 2021-08-20 ENCOUNTER — Ambulatory Visit (INDEPENDENT_AMBULATORY_CARE_PROVIDER_SITE_OTHER): Payer: Medicare Other

## 2021-08-20 DIAGNOSIS — I442 Atrioventricular block, complete: Secondary | ICD-10-CM

## 2021-08-20 LAB — CUP PACEART REMOTE DEVICE CHECK
Battery Remaining Longevity: 152 mo
Battery Voltage: 3.05 V
Brady Statistic AP VP Percent: 0 %
Brady Statistic AP VS Percent: 0 %
Brady Statistic AS VP Percent: 99.54 %
Brady Statistic AS VS Percent: 0.46 %
Brady Statistic RA Percent Paced: 0 %
Brady Statistic RV Percent Paced: 99.54 %
Date Time Interrogation Session: 20230104232919
Implantable Lead Implant Date: 20210707
Implantable Lead Implant Date: 20210707
Implantable Lead Location: 753859
Implantable Lead Location: 753860
Implantable Lead Model: 3830
Implantable Lead Model: 5076
Implantable Pulse Generator Implant Date: 20210707
Lead Channel Impedance Value: 304 Ohm
Lead Channel Impedance Value: 380 Ohm
Lead Channel Impedance Value: 380 Ohm
Lead Channel Impedance Value: 532 Ohm
Lead Channel Pacing Threshold Amplitude: 0.625 V
Lead Channel Pacing Threshold Pulse Width: 0.4 ms
Lead Channel Sensing Intrinsic Amplitude: 1.5 mV
Lead Channel Sensing Intrinsic Amplitude: 1.5 mV
Lead Channel Sensing Intrinsic Amplitude: 23.5 mV
Lead Channel Sensing Intrinsic Amplitude: 23.5 mV
Lead Channel Setting Pacing Amplitude: 2 V
Lead Channel Setting Pacing Pulse Width: 0.4 ms
Lead Channel Setting Sensing Sensitivity: 1.2 mV

## 2021-08-21 DIAGNOSIS — K635 Polyp of colon: Secondary | ICD-10-CM | POA: Diagnosis not present

## 2021-08-21 DIAGNOSIS — D500 Iron deficiency anemia secondary to blood loss (chronic): Secondary | ICD-10-CM | POA: Diagnosis not present

## 2021-08-21 DIAGNOSIS — Z980 Intestinal bypass and anastomosis status: Secondary | ICD-10-CM | POA: Diagnosis not present

## 2021-08-25 ENCOUNTER — Other Ambulatory Visit: Payer: Self-pay | Admitting: Gastroenterology

## 2021-08-31 NOTE — Progress Notes (Signed)
Remote pacemaker transmission.   

## 2021-09-08 ENCOUNTER — Encounter (HOSPITAL_COMMUNITY): Payer: Self-pay | Admitting: Gastroenterology

## 2021-09-14 DIAGNOSIS — N1831 Chronic kidney disease, stage 3a: Secondary | ICD-10-CM | POA: Diagnosis not present

## 2021-09-14 DIAGNOSIS — D510 Vitamin B12 deficiency anemia due to intrinsic factor deficiency: Secondary | ICD-10-CM | POA: Diagnosis not present

## 2021-09-14 DIAGNOSIS — M459 Ankylosing spondylitis of unspecified sites in spine: Secondary | ICD-10-CM | POA: Diagnosis not present

## 2021-09-14 DIAGNOSIS — I5030 Unspecified diastolic (congestive) heart failure: Secondary | ICD-10-CM | POA: Diagnosis not present

## 2021-09-14 DIAGNOSIS — I10 Essential (primary) hypertension: Secondary | ICD-10-CM | POA: Diagnosis not present

## 2021-09-14 DIAGNOSIS — I482 Chronic atrial fibrillation, unspecified: Secondary | ICD-10-CM | POA: Diagnosis not present

## 2021-09-14 DIAGNOSIS — M1612 Unilateral primary osteoarthritis, left hip: Secondary | ICD-10-CM | POA: Diagnosis not present

## 2021-09-14 DIAGNOSIS — J45909 Unspecified asthma, uncomplicated: Secondary | ICD-10-CM | POA: Diagnosis not present

## 2021-09-14 DIAGNOSIS — M179 Osteoarthritis of knee, unspecified: Secondary | ICD-10-CM | POA: Diagnosis not present

## 2021-09-15 ENCOUNTER — Telehealth (HOSPITAL_COMMUNITY): Payer: Self-pay | Admitting: *Deleted

## 2021-09-15 NOTE — Telephone Encounter (Signed)
Received fax from [facility], pt needs clearance for colonoscopy and to hold Eliquis prior  Per Dr Haroldine Laws: "can stop for 2-3 days, restart asap"  Note faxed back to [facility] gi at [phone]

## 2021-09-15 NOTE — Anesthesia Preprocedure Evaluation (Addendum)
Anesthesia Evaluation  Patient identified by MRN, date of birth, ID band Patient awake    Reviewed: Allergy & Precautions, NPO status , Patient's Chart, lab work & pertinent test results  Airway Mallampati: II  TM Distance: >3 FB Neck ROM: Full    Dental no notable dental hx. (+) Teeth Intact, Dental Advisory Given   Pulmonary asthma , sleep apnea and Continuous Positive Airway Pressure Ventilation ,    Pulmonary exam normal breath sounds clear to auscultation       Cardiovascular hypertension, Pt. on medications +CHF  Normal cardiovascular exam+ dysrhythmias Atrial Fibrillation + pacemaker  Rhythm:Regular Rate:Normal  Cardiac amyloid 04/2020 echo 1. Left ventricular ejection fraction, by estimation, is 60 to 65%. The  left ventricle has normal function. The left ventricle has no regional  wall motion abnormalities. There is severe eccentric left ventricular  hypertrophy. Left ventricular diastolic  function could not be evaluated.  2. Right ventricular systolic function is normal. The right ventricular  size is normal.  3. Left atrial size was moderately dilated.  4. The mitral valve is normal in structure. Mild mitral valve  regurgitation.  5. The aortic valve is tricuspid. There is mild calcification of the  aortic valve. There is mild thickening of the aortic valve. Aortic valve  regurgitation is trivial. No aortic stenosis is present.  6. Aortic dilatation noted. There is mild dilatation of the ascending  aorta, measuring 41 mm.    Neuro/Psych PSYCHIATRIC DISORDERS Depression    GI/Hepatic   Endo/Other    Renal/GU Renal InsufficiencyRenal disease     Musculoskeletal  (+) Arthritis ,   Abdominal   Peds  Hematology  (+) Blood dyscrasia, ,   Anesthesia Other Findings All: Xarelto, Ace inhibitors  Reproductive/Obstetrics                            Anesthesia Physical Anesthesia  Plan  ASA: 3  Anesthesia Plan: MAC   Post-op Pain Management:    Induction:   PONV Risk Score and Plan: Treatment may vary due to age or medical condition  Airway Management Planned: Natural Airway and Nasal Cannula  Additional Equipment:   Intra-op Plan:   Post-operative Plan:   Informed Consent: I have reviewed the patients History and Physical, chart, labs and discussed the procedure including the risks, benefits and alternatives for the proposed anesthesia with the patient or authorized representative who has indicated his/her understanding and acceptance.     Dental advisory given  Plan Discussed with:   Anesthesia Plan Comments: (Hx of colon polyps)      Anesthesia Quick Evaluation

## 2021-09-16 ENCOUNTER — Ambulatory Visit (HOSPITAL_COMMUNITY): Payer: Medicare Other | Admitting: Anesthesiology

## 2021-09-16 ENCOUNTER — Encounter (HOSPITAL_COMMUNITY): Payer: Self-pay | Admitting: Gastroenterology

## 2021-09-16 ENCOUNTER — Encounter (HOSPITAL_COMMUNITY)
Admission: RE | Disposition: A | Discharge status: Home / Self Care | Payer: Self-pay | Source: Home / Self Care | Attending: Gastroenterology

## 2021-09-16 ENCOUNTER — Other Ambulatory Visit: Payer: Self-pay

## 2021-09-16 ENCOUNTER — Ambulatory Visit (HOSPITAL_COMMUNITY)
Admission: RE | Admit: 2021-09-16 | Discharge: 2021-09-16 | Disposition: A | Discharge status: Home / Self Care | Payer: Medicare Other | Attending: Gastroenterology | Admitting: Gastroenterology

## 2021-09-16 DIAGNOSIS — G4730 Sleep apnea, unspecified: Secondary | ICD-10-CM | POA: Insufficient documentation

## 2021-09-16 DIAGNOSIS — Z9884 Bariatric surgery status: Secondary | ICD-10-CM | POA: Diagnosis not present

## 2021-09-16 DIAGNOSIS — K644 Residual hemorrhoidal skin tags: Secondary | ICD-10-CM | POA: Insufficient documentation

## 2021-09-16 DIAGNOSIS — I700 Atherosclerosis of aorta: Secondary | ICD-10-CM | POA: Insufficient documentation

## 2021-09-16 DIAGNOSIS — I110 Hypertensive heart disease with heart failure: Secondary | ICD-10-CM | POA: Insufficient documentation

## 2021-09-16 DIAGNOSIS — Z09 Encounter for follow-up examination after completed treatment for conditions other than malignant neoplasm: Secondary | ICD-10-CM | POA: Insufficient documentation

## 2021-09-16 DIAGNOSIS — K648 Other hemorrhoids: Secondary | ICD-10-CM | POA: Diagnosis not present

## 2021-09-16 DIAGNOSIS — Z7901 Long term (current) use of anticoagulants: Secondary | ICD-10-CM | POA: Diagnosis not present

## 2021-09-16 DIAGNOSIS — Z980 Intestinal bypass and anastomosis status: Secondary | ICD-10-CM | POA: Diagnosis not present

## 2021-09-16 DIAGNOSIS — D124 Benign neoplasm of descending colon: Secondary | ICD-10-CM | POA: Insufficient documentation

## 2021-09-16 DIAGNOSIS — F32A Depression, unspecified: Secondary | ICD-10-CM | POA: Insufficient documentation

## 2021-09-16 DIAGNOSIS — I509 Heart failure, unspecified: Secondary | ICD-10-CM | POA: Insufficient documentation

## 2021-09-16 DIAGNOSIS — J45909 Unspecified asthma, uncomplicated: Secondary | ICD-10-CM | POA: Diagnosis not present

## 2021-09-16 DIAGNOSIS — K635 Polyp of colon: Secondary | ICD-10-CM | POA: Diagnosis not present

## 2021-09-16 DIAGNOSIS — Z86010 Personal history of colonic polyps: Secondary | ICD-10-CM | POA: Insufficient documentation

## 2021-09-16 HISTORY — PX: COLONOSCOPY WITH PROPOFOL: SHX5780

## 2021-09-16 HISTORY — PX: POLYPECTOMY: SHX5525

## 2021-09-16 SURGERY — COLONOSCOPY WITH PROPOFOL
Anesthesia: Monitor Anesthesia Care

## 2021-09-16 MED ORDER — PHENYLEPHRINE HCL (PRESSORS) 10 MG/ML IV SOLN
INTRAVENOUS | Status: DC | PRN
  Administered 2021-09-16: 80 ug via INTRAVENOUS

## 2021-09-16 MED ORDER — SODIUM CHLORIDE 0.9 % IV SOLN: INTRAVENOUS | Status: DC

## 2021-09-16 MED ORDER — PROPOFOL 10 MG/ML IV BOLUS
INTRAVENOUS | Status: DC | PRN
  Administered 2021-09-16: 20 mg via INTRAVENOUS

## 2021-09-16 MED ORDER — LACTATED RINGERS IV SOLN
INTRAVENOUS | Status: DC | PRN
Start: 2021-09-16 — End: 2021-09-16

## 2021-09-16 MED ORDER — LACTATED RINGERS IV SOLN: INTRAVENOUS | Status: DC

## 2021-09-16 MED ORDER — PROPOFOL 500 MG/50ML IV EMUL
INTRAVENOUS | Status: DC | PRN
  Administered 2021-09-16: 100 ug/kg/min via INTRAVENOUS

## 2021-09-16 SURGICAL SUPPLY — 22 items

## 2021-09-16 NOTE — Anesthesia Postprocedure Evaluation (Signed)
Anesthesia Post Note  Patient: Eric Mayer  Procedure(s) Performed: COLONOSCOPY WITH PROPOFOL POLYPECTOMY     Patient location during evaluation: Endoscopy Anesthesia Type: MAC Level of consciousness: awake and alert Pain management: pain level controlled Vital Signs Assessment: post-procedure vital signs reviewed and stable Respiratory status: spontaneous breathing, nonlabored ventilation, respiratory function stable and patient connected to nasal cannula oxygen Cardiovascular status: blood pressure returned to baseline and stable Postop Assessment: no apparent nausea or vomiting Anesthetic complications: no   No notable events documented.  Last Vitals:  Vitals:   09/16/21 1230 09/16/21 1234  BP: (!) 149/74 (!) 150/74  Pulse: 70 69  Resp: (!) 25 (!) 23  Temp:    SpO2: 92% 92%    Last Pain:  Vitals:   09/16/21 1234  TempSrc:   PainSc: 0-No pain                 Barnet Glasgow

## 2021-09-16 NOTE — Progress Notes (Signed)
Brentley Landfair Trice 11:21 AM  Subjective: Patient doing well without any problems since we saw him in the office and we rediscussed the procedure and he stopped his Eliquis on Sunday  Objective: Vital signs stable afebrile exam please see preassessment evaluation no acute distress  Assessment: Colon polyp with high-grade dysplasia want to make sure no residual prior to surgical options on the right side of his colon  Plan: Okay to proceed with colonoscopy with anesthesia assistance  [facility] E  office [phone] After 5PM or if no answer call [phone]

## 2021-09-16 NOTE — Anesthesia Procedure Notes (Signed)
Procedure Name: MAC Date/Time: 09/16/2021 11:22 AM Performed by: Niel Hummer, CRNA Pre-anesthesia Checklist: Patient identified, Emergency Drugs available, Suction available and Patient being monitored Oxygen Delivery Method: Simple face mask

## 2021-09-16 NOTE — Op Note (Signed)
[facility] Patient Name: Eric Mayer Procedure Date: 09/16/2021 MRN: [id number] Attending MD: [name] , MD Date of Birth: Aug 03, 1941 CSN: [id number] Age: 81 Admit Type: Inpatient Procedure:                Colonoscopy Indications:              High risk colon cancer surveillance: Personal                            history of colonic polyps 1 with high-grade                            dysplasia want to repeat to make sure no residual Providers:                [name], MD, Carmie End, RN, Tyna Jaksch                            Technician Referring MD:              Medicines:                Propofol per Anesthesia Complications:            No immediate complications. Estimated Blood Loss:     Estimated blood loss: none. Procedure:                Pre-Anesthesia Assessment:                           - Prior to the procedure, a History and Physical                            was performed, and patient medications and                            allergies were reviewed. The patient's tolerance of                            previous anesthesia was also reviewed. The risks                            and benefits of the procedure and the sedation                            options and risks were discussed with the patient.                            All questions were answered, and informed consent                            was obtained. [Anticoagulant Agents] [JQBH Prior to                            [place]. [ASA Grade]. After reviewing the risks  and benefits, the patient was deemed in                            satisfactory condition to undergo the procedure.                           - Prior to the procedure, a History and Physical                            was performed, and patient medications and                            allergies were reviewed. The patient's tolerance of                            previous anesthesia was  also reviewed. The risks                            and benefits of the procedure and the sedation                            options and risks were discussed with the patient.                            All questions were answered, and informed consent                            was obtained. Prior Anticoagulants: The patient has                            taken Eliquis (apixaban), last dose was 3 days                            prior to procedure. ASA Grade Assessment: III - A                            patient with severe systemic disease. After                            reviewing the risks and benefits, the patient was                            deemed in satisfactory condition to undergo the                            procedure.                           - Prior to the procedure, a History and Physical                            was performed, and patient medications and  allergies were reviewed. The patient's tolerance of                            previous anesthesia was also reviewed. The risks                            and benefits of the procedure and the sedation                            options and risks were discussed with the patient.                            All questions were answered, and informed consent                            was obtained. Prior Anticoagulants: The patient has                            taken Eliquis (apixaban), last dose was 3 days                            prior to procedure. ASA Grade Assessment: III - A                            patient with severe systemic disease. After                            reviewing the risks and benefits, the patient was                            deemed in satisfactory condition to undergo the                            procedure.                           After obtaining informed consent, the colonoscope                            was passed under direct vision. Throughout the                             procedure, the patient's blood pressure, pulse, and                            oxygen saturations were monitored continuously. The                            CF-HQ190L ([id number]) Olympus colonoscope was                            introduced through the anus and advanced to the the  ileocolonic anastomosis. And anastomosis were                            photographed. The colonoscopy was somewhat                            difficult due to fair bowel prep, a redundant                            colon, the patient's body habitus and the patient's                            inability to hold air. Successful completion of the                            procedure was aided by applying abdominal pressure.                            The patient tolerated the procedure well. The                            quality of the bowel preparation was fair. Lots of                            washing and suctioning was done for adequate                            visualization Scope In: 11:30:25 AM Scope Out: 12:03:05 PM Scope Withdrawal Time: 0 hours 19 minutes 16 seconds  Total Procedure Duration: 0 hours 32 minutes 40 seconds  Findings:      External and internal hemorrhoids were found during retroflexion, during       perianal exam and during digital exam. The hemorrhoids were medium-sized.      Two semi-sessile polyps were found in the descending colon. The polyps       were small in size. These polyps were removed with a hot snare.       Resection and retrieval were complete.      There was evidence of a prior end-to-side ileo-colonic anastomosis in       the distal transverse colon. This was patent and was characterized by       healthy appearing mucosa. The anastomosis was traversed short ways down       both lumen and our goal today was not to reach the cecum since I believe       that needs to be removed surgically for the difficult to remove polyp        based on its size location and difficulty with the procedure and I have       shown the pictures to Dr. Rush Landmark who agrees.      The exam was otherwise without abnormality. Impression:               - Preparation of the colon was fair.                           - External and internal hemorrhoids.                           -  Two small polyps in the descending colon, removed                            with a hot snare. Resected and retrieved.                           - Patent end-to-side ileo-colonic anastomosis,                            characterized by healthy appearing mucosa.                           - The examination was otherwise normal. Moderate Sedation:      Not Applicable - Patient had care per Anesthesia. Recommendation:           - Patient has a contact number available for                            emergencies. The signs and symptoms of potential                            delayed complications were discussed with the                            patient. Return to normal activities tomorrow.                            Written discharge instructions were provided to the                            patient.                           - Soft diet today.                           - Continue present medications.                           - Await pathology results.                           - Repeat colonoscopy in 1 year for surveillance                            based on pathology results.                           - Return to GI office PRN.                           - Telephone GI clinic for pathology results in 1                            week. Let me know if you do not hear by then from  the surgeon                           - Telephone GI clinic if symptomatic PRN.                           - Resume Eliquis (apixaban) at prior dose in 2                            days. If doing well without signs of bleeding Procedure Code(s):        ---  Professional ---                           [id number], Colonoscopy, flexible; with removal of                            tumor(s), polyp(s), or other lesion(s) by snare                            technique Diagnosis Code(s):        --- Professional ---                           Z86.010, Personal history of colonic polyps                           K63.5, Polyp of colon                           Z98.0, Intestinal bypass and anastomosis status CPT copyright 2019 [employer]. All rights reserved. The codes documented in this report are preliminary and upon coder review may  be revised to meet current compliance requirements. [name], MD 09/16/2021 12:35:38 PM This report has been signed electronically. Number of Addenda: 0

## 2021-09-16 NOTE — Transfer of Care (Signed)
Immediate Anesthesia Transfer of Care Note  Patient: Eric Mayer  Procedure(s) Performed: COLONOSCOPY WITH PROPOFOL POLYPECTOMY  Patient Location: PACU  Anesthesia Type:MAC  Level of Consciousness: awake, alert , oriented and patient cooperative  Airway & Oxygen Therapy: Patient Spontanous Breathing and Patient connected to face mask oxygen  Post-op Assessment: Report given to RN and Post -op Vital signs reviewed and stable  Post vital signs: Reviewed and stable  Last Vitals:  Vitals Value Taken Time  BP 136/55 09/16/21 1213  Temp    Pulse 75 09/16/21 1213  Resp 24 09/16/21 1213  SpO2 96 % 09/16/21 1213    Last Pain:  Vitals:   09/16/21 1032  TempSrc: Oral         Complications: No notable events documented.

## 2021-09-16 NOTE — Discharge Instructions (Addendum)
Soft solids today slowly advance as tolerated and if doing well in 2 days may resume your Eliquis and call if question or problem otherwise call in 1 week for pathology and let me know if you have not heard from the surgeon by that time  YOU HAD AN ENDOSCOPIC PROCEDURE TODAY: Refer to the procedure report and other information in the discharge instructions given to you for any specific questions about what was found during the examination. If this information does not answer your questions, please call [facility] office at [phone] to clarify.   YOU SHOULD EXPECT: Some feelings of bloating in the abdomen. Passage of more gas than usual. Walking can help get rid of the air that was put into your GI tract during the procedure and reduce the bloating. If you had a lower endoscopy (such as a colonoscopy or flexible sigmoidoscopy) you may notice spotting of blood in your stool or on the toilet paper. Some abdominal soreness may be present for a day or two, also.  DIET: See above MD note regarding soft diet. Drink plenty of fluids but you should avoid alcoholic beverages for 24 hours.   ACTIVITY: Your care partner should take you home directly after the procedure. You should plan to take it easy, moving slowly for the rest of the day. You can resume normal activity the day after the procedure however YOU SHOULD NOT DRIVE, use power tools, machinery or perform tasks that involve climbing or major physical exertion for 24 hours (because of the sedation medicines used during the test).   SYMPTOMS TO REPORT IMMEDIATELY: A gastroenterologist can be reached at any hour. Please call [phone]  for any of the following symptoms:  Following lower endoscopy (colonoscopy, flexible sigmoidoscopy) Excessive amounts of blood in the stool  Significant tenderness, worsening of abdominal pains  Swelling of the abdomen that is new, acute  Fever of 100 or higher   FOLLOW UP:  If any biopsies were taken you will be  contacted by phone or by letter within the next 1-3 weeks. Call [phone]  if you have not heard about the biopsies in 3 weeks.  Please also call with any specific questions about appointments or follow up tests.

## 2021-09-17 ENCOUNTER — Encounter (HOSPITAL_COMMUNITY): Payer: Self-pay | Admitting: Gastroenterology

## 2021-09-17 ENCOUNTER — Other Ambulatory Visit (HOSPITAL_COMMUNITY): Payer: Self-pay | Admitting: Internal Medicine

## 2021-09-17 LAB — SURGICAL PATHOLOGY

## 2021-09-18 ENCOUNTER — Ambulatory Visit
Admission: RE | Admit: 2021-09-18 | Discharge: 2021-09-18 | Disposition: A | Discharge status: Home / Self Care | Payer: Medicare Other | Source: Ambulatory Visit | Attending: Internal Medicine | Admitting: Internal Medicine

## 2021-09-18 ENCOUNTER — Other Ambulatory Visit: Payer: Self-pay | Admitting: Internal Medicine

## 2021-09-18 DIAGNOSIS — M810 Age-related osteoporosis without current pathological fracture: Secondary | ICD-10-CM | POA: Diagnosis not present

## 2021-09-18 DIAGNOSIS — R0601 Orthopnea: Secondary | ICD-10-CM | POA: Diagnosis not present

## 2021-09-18 DIAGNOSIS — R634 Abnormal weight loss: Secondary | ICD-10-CM | POA: Diagnosis not present

## 2021-09-18 DIAGNOSIS — M8580 Other specified disorders of bone density and structure, unspecified site: Secondary | ICD-10-CM

## 2021-09-18 DIAGNOSIS — R531 Weakness: Secondary | ICD-10-CM | POA: Diagnosis not present

## 2021-09-18 DIAGNOSIS — D649 Anemia, unspecified: Secondary | ICD-10-CM | POA: Diagnosis not present

## 2021-09-18 DIAGNOSIS — J90 Pleural effusion, not elsewhere classified: Secondary | ICD-10-CM | POA: Diagnosis not present

## 2021-09-18 DIAGNOSIS — I517 Cardiomegaly: Secondary | ICD-10-CM | POA: Diagnosis not present

## 2021-09-29 ENCOUNTER — Encounter (HOSPITAL_COMMUNITY): Payer: Self-pay | Admitting: Internal Medicine

## 2021-09-29 ENCOUNTER — Other Ambulatory Visit (HOSPITAL_COMMUNITY): Payer: Self-pay

## 2021-09-29 DIAGNOSIS — I5032 Chronic diastolic (congestive) heart failure: Secondary | ICD-10-CM

## 2021-10-06 DIAGNOSIS — R2681 Unsteadiness on feet: Secondary | ICD-10-CM | POA: Diagnosis not present

## 2021-10-06 DIAGNOSIS — M6281 Muscle weakness (generalized): Secondary | ICD-10-CM | POA: Diagnosis not present

## 2021-10-07 ENCOUNTER — Ambulatory Visit (HOSPITAL_COMMUNITY)
Admission: RE | Admit: 2021-10-07 | Discharge: 2021-10-07 | Disposition: A | Discharge status: Home / Self Care | Payer: Medicare Other | Source: Ambulatory Visit | Attending: Internal Medicine | Admitting: Internal Medicine

## 2021-10-07 ENCOUNTER — Other Ambulatory Visit: Payer: Self-pay

## 2021-10-07 DIAGNOSIS — I5032 Chronic diastolic (congestive) heart failure: Secondary | ICD-10-CM | POA: Diagnosis not present

## 2021-10-07 LAB — BASIC METABOLIC PANEL
Anion gap: 9 (ref 5–15)
BUN: 31 mg/dL — ABNORMAL HIGH (ref 8–23)
CO2: 32 mmol/L (ref 22–32)
Calcium: 8.8 mg/dL — ABNORMAL LOW (ref 8.9–10.3)
Chloride: 98 mmol/L (ref 98–111)
Creatinine, Ser: 1.4 mg/dL — ABNORMAL HIGH (ref 0.61–1.24)
GFR, Estimated: 51 mL/min — ABNORMAL LOW (ref >60)
Glucose, Bld: 99 mg/dL (ref 70–99)
Potassium: 4.1 mmol/L (ref 3.5–5.1)
Sodium: 139 mmol/L (ref 135–145)

## 2021-10-07 LAB — MAGNESIUM: Magnesium: 2.5 mg/dL — ABNORMAL HIGH (ref 1.7–2.4)

## 2021-10-07 LAB — BRAIN NATRIURETIC PEPTIDE: B Natriuretic Peptide: 663.9 pg/mL — ABNORMAL HIGH (ref 0.0–100.0)

## 2021-10-09 DIAGNOSIS — M6281 Muscle weakness (generalized): Secondary | ICD-10-CM | POA: Diagnosis not present

## 2021-10-09 DIAGNOSIS — R2681 Unsteadiness on feet: Secondary | ICD-10-CM | POA: Diagnosis not present

## 2021-10-12 DIAGNOSIS — K5900 Constipation, unspecified: Secondary | ICD-10-CM | POA: Diagnosis not present

## 2021-10-12 DIAGNOSIS — I5030 Unspecified diastolic (congestive) heart failure: Secondary | ICD-10-CM | POA: Diagnosis not present

## 2021-10-12 DIAGNOSIS — R42 Dizziness and giddiness: Secondary | ICD-10-CM | POA: Diagnosis not present

## 2021-10-12 DIAGNOSIS — R634 Abnormal weight loss: Secondary | ICD-10-CM | POA: Diagnosis not present

## 2021-10-13 DIAGNOSIS — R2681 Unsteadiness on feet: Secondary | ICD-10-CM | POA: Diagnosis not present

## 2021-10-13 DIAGNOSIS — I10 Essential (primary) hypertension: Secondary | ICD-10-CM | POA: Diagnosis not present

## 2021-10-13 DIAGNOSIS — I482 Chronic atrial fibrillation, unspecified: Secondary | ICD-10-CM | POA: Diagnosis not present

## 2021-10-13 DIAGNOSIS — I5030 Unspecified diastolic (congestive) heart failure: Secondary | ICD-10-CM | POA: Diagnosis not present

## 2021-10-13 DIAGNOSIS — M6281 Muscle weakness (generalized): Secondary | ICD-10-CM | POA: Diagnosis not present

## 2021-10-20 ENCOUNTER — Ambulatory Visit: Payer: Self-pay | Admitting: Surgery

## 2021-10-20 DIAGNOSIS — K5900 Constipation, unspecified: Secondary | ICD-10-CM | POA: Insufficient documentation

## 2021-10-20 DIAGNOSIS — M459 Ankylosing spondylitis of unspecified sites in spine: Secondary | ICD-10-CM | POA: Insufficient documentation

## 2021-10-20 DIAGNOSIS — Z9884 Bariatric surgery status: Secondary | ICD-10-CM | POA: Diagnosis not present

## 2021-10-20 DIAGNOSIS — R140 Abdominal distension (gaseous): Secondary | ICD-10-CM | POA: Diagnosis not present

## 2021-10-20 DIAGNOSIS — K8020 Calculus of gallbladder without cholecystitis without obstruction: Secondary | ICD-10-CM | POA: Insufficient documentation

## 2021-10-20 DIAGNOSIS — K8010 Calculus of gallbladder with chronic cholecystitis without obstruction: Secondary | ICD-10-CM | POA: Diagnosis not present

## 2021-10-20 DIAGNOSIS — Z860100 Personal history of colon polyps, unspecified: Secondary | ICD-10-CM | POA: Insufficient documentation

## 2021-10-20 DIAGNOSIS — D500 Iron deficiency anemia secondary to blood loss (chronic): Secondary | ICD-10-CM | POA: Insufficient documentation

## 2021-10-20 DIAGNOSIS — E291 Testicular hypofunction: Secondary | ICD-10-CM | POA: Insufficient documentation

## 2021-10-20 DIAGNOSIS — F3342 Major depressive disorder, recurrent, in full remission: Secondary | ICD-10-CM | POA: Insufficient documentation

## 2021-10-20 DIAGNOSIS — Z993 Dependence on wheelchair: Secondary | ICD-10-CM | POA: Diagnosis not present

## 2021-10-20 DIAGNOSIS — Z7901 Long term (current) use of anticoagulants: Secondary | ICD-10-CM | POA: Diagnosis not present

## 2021-10-20 DIAGNOSIS — N1831 Chronic kidney disease, stage 3a: Secondary | ICD-10-CM | POA: Diagnosis not present

## 2021-10-20 DIAGNOSIS — K760 Fatty (change of) liver, not elsewhere classified: Secondary | ICD-10-CM | POA: Insufficient documentation

## 2021-10-20 DIAGNOSIS — I482 Chronic atrial fibrillation, unspecified: Secondary | ICD-10-CM | POA: Diagnosis not present

## 2021-10-20 DIAGNOSIS — K635 Polyp of colon: Secondary | ICD-10-CM | POA: Insufficient documentation

## 2021-10-20 DIAGNOSIS — K2950 Unspecified chronic gastritis without bleeding: Secondary | ICD-10-CM | POA: Insufficient documentation

## 2021-10-20 DIAGNOSIS — R739 Hyperglycemia, unspecified: Secondary | ICD-10-CM

## 2021-10-20 DIAGNOSIS — G4733 Obstructive sleep apnea (adult) (pediatric): Secondary | ICD-10-CM | POA: Diagnosis not present

## 2021-10-20 DIAGNOSIS — D509 Iron deficiency anemia, unspecified: Secondary | ICD-10-CM | POA: Insufficient documentation

## 2021-10-20 DIAGNOSIS — N400 Enlarged prostate without lower urinary tract symptoms: Secondary | ICD-10-CM | POA: Insufficient documentation

## 2021-10-20 DIAGNOSIS — D510 Vitamin B12 deficiency anemia due to intrinsic factor deficiency: Secondary | ICD-10-CM | POA: Insufficient documentation

## 2021-10-20 DIAGNOSIS — K5909 Other constipation: Secondary | ICD-10-CM | POA: Diagnosis not present

## 2021-10-20 DIAGNOSIS — K6389 Other specified diseases of intestine: Secondary | ICD-10-CM | POA: Diagnosis not present

## 2021-10-21 ENCOUNTER — Telehealth: Payer: Self-pay | Admitting: *Deleted

## 2021-10-21 ENCOUNTER — Ambulatory Visit (INDEPENDENT_AMBULATORY_CARE_PROVIDER_SITE_OTHER): Payer: Medicare Other | Admitting: Physician Assistant

## 2021-10-21 ENCOUNTER — Encounter: Payer: Self-pay | Admitting: Internal Medicine

## 2021-10-21 DIAGNOSIS — I2510 Atherosclerotic heart disease of native coronary artery without angina pectoris: Secondary | ICD-10-CM | POA: Diagnosis not present

## 2021-10-21 DIAGNOSIS — Z01810 Encounter for preprocedural cardiovascular examination: Secondary | ICD-10-CM | POA: Diagnosis not present

## 2021-10-21 NOTE — Telephone Encounter (Signed)
Pt has been scheduled for telephone visit, today, 10/21/21. ? ?Medications reconciled. ? ? ?  ?Patient Consent for Virtual Visit  ? ? ?   ? ?Eric Mayer has provided verbal consent on 10/21/2021 for a virtual visit (video or telephone). ? ? ?CONSENT FOR VIRTUAL VISIT FOR:  Eric Mayer  ?By participating in this virtual visit I agree to the following: ? ?I hereby voluntarily request, consent and authorize [facility] and its employed or contracted physicians, physician assistants, nurse practitioners or other licensed health care professionals (the Practitioner), to provide me with telemedicine health care services (the ?Services") as deemed necessary by the treating Practitioner. I acknowledge and consent to receive the Services by the Practitioner via telemedicine. I understand that the telemedicine visit will involve communicating with the Practitioner through live audiovisual communication technology and the disclosure of certain medical information by electronic transmission. I acknowledge that I have been given the opportunity to request an in-person assessment or other available alternative prior to the telemedicine visit and am voluntarily participating in the telemedicine visit. ? ?I understand that I have the right to withhold or withdraw my consent to the use of telemedicine in the course of my care at any time, without affecting my right to future care or treatment, and that the Practitioner or I may terminate the telemedicine visit at any time. I understand that I have the right to inspect all information obtained and/or recorded in the course of the telemedicine visit and may receive copies of available information for a reasonable fee.  I understand that some of the potential risks of receiving the Services via telemedicine include:  ?Delay or interruption in medical evaluation due to technological equipment failure or disruption; ?Information transmitted may not be sufficient (e.g. poor  resolution of images) to allow for appropriate medical decision making by the Practitioner; and/or  ?In rare instances, security protocols could fail, causing a breach of personal health information. ? ?Furthermore, I acknowledge that it is my responsibility to provide information about my medical history, conditions and care that is complete and accurate to the best of my ability. I acknowledge that Practitioner's advice, recommendations, and/or decision may be based on factors not within their control, such as incomplete or inaccurate data provided by me or distortions of diagnostic images or specimens that may result from electronic transmissions. I understand that the practice of medicine is not an exact science and that Practitioner makes no warranties or guarantees regarding treatment outcomes. I acknowledge that a copy of this consent can be made available to me via my patient portal ([facility]), or I can request a printed copy by calling the office of [facility].   ? ?I understand that my insurance will be billed for this visit.  ? ?I have read or had this consent read to me. ?I understand the contents of this consent, which adequately explains the benefits and risks of the Services being provided via telemedicine.  ?I have been provided ample opportunity to ask questions regarding this consent and the Services and have had my questions answered to my satisfaction. ?I give my informed consent for the services to be provided through the use of telemedicine in my medical care ? ? ? ?

## 2021-10-21 NOTE — Progress Notes (Signed)
Virtual Visit via Telephone Note   This visit type was conducted due to national recommendations for restrictions regarding the COVID-19 Pandemic (e.g. social distancing) in an effort to limit this patient's exposure and mitigate transmission in our community.  Due to his co-morbid illnesses, this patient is at least at moderate risk for complications without adequate follow up.  This format is felt to be most appropriate for this patient at this time.  The patient did not have access to video technology/had technical difficulties with video requiring transitioning to audio format only (telephone).  All issues noted in this document were discussed and addressed.  No physical exam could be performed with this format.  Please refer to the patient's chart for his  consent to telehealth for [facility]. Evaluation Performed:  Preoperative cardiovascular risk assessment  This visit type was conducted due to national recommendations for restrictions regarding the COVID-19 Pandemic (e.g. social distancing).  This format is felt to be most appropriate for this patient at this time.  All issues noted in this document were discussed and addressed.  No physical exam was performed (except for noted visual exam findings with Video Visits).  Please refer to the patient's chart (MyChart message for video visits and phone note for telephone visits) for the patient's consent to telehealth for [facility]. _____________   Date:  10/21/2021   Patient ID:  Eric Mayer, DOB 02-26-1941, MRN [id number] Patient Location:  Home Provider location:   Office  Primary Care Provider:  [name], MD Primary Cardiologist:  [name], MD EP: Dr. Lovena Le CHF: Dr. Haroldine Laws  Chief Complaint    81 y.o. y/o male with a h/o of persistent atrial flutter/ fib s/p AV node ablation and DDD PM insertion, HTN, OSA on CPAP, prostate CA, IDA, diastolic HF, chronic dyspnea, restrictive lung disease and hx of gastric  bypass in 1980s, who is pending [place] , and presents today for telephonic preoperative cardiovascular risk assessment.  Past Medical History    Past Medical History:  Diagnosis Date   Anemia 1980s X 1   Arthritis    "hands, knees, hips, ankles" (07/31/2015)   Atrial flutter ([place]) 07/31/2015   CHF (congestive heart failure) (HCC)    Chronic diastolic heart failure ([place])    a. 11/2015: Echo w/ EF of 60-65%, no WMA, Grade 2 DD, trivial AR, ascending aorta mildly dilated.    Depression    History of cardiovascular stress test    a. 03/2015: Dobutamine Stress Echo with no evidence of ischemia.    History of gastric bypass    History of peptic ulcer    1980's   History of radiation therapy 1993   sarcoma of left groin, tx at [facility]   History of sarcoma    1993  LEFT GOIN--  S/P SURGERY, RADIATION AND CHEMO IN [place]   Hypertension    Hypogonadism in male    Incomplete right bundle branch block    Nerve injury    SURGICAL NERVE INJURY S/P  LEFT GOIN REMOVAL SARCOMA 1992--  RESIDUAL WEAKNESS AND NUMBNESS UPPER LEFT LEG   Nocturia    OSA on CPAP    Persistent atrial fibrillation ([place])        Presence of permanent cardiac pacemaker 02/20/2020   Primary prostate adenocarcinoma ([place]) DX 07/08/14   Gleason 7,  stage T1c   PVC's (premature ventricular contractions) 11/21/2015   Sarcoma ([place]) ~ 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   "of groin"   Weakness of left  leg    SECONDARY TO SURGICAL NERVE INJURY OF LEFT GOIN   Wears glasses    Past Surgical History:  Procedure Laterality Date   ATRIAL FIBRILLATION ABLATION N/A 06/08/2018   Procedure: ATRIAL FIBRILLATION ABLATION;  Surgeon: Thompson Grayer, MD;  Location: [place] [facility];  Service: Cardiovascular;  Laterality: N/A;   ATRIAL FIBRILLATION ABLATION N/A 12/20/2018   Procedure: ATRIAL FIBRILLATION ABLATION;  Surgeon: Thompson Grayer, MD;  Location: [place] [facility];  Service: Cardiovascular;  Laterality: N/A;   AV NODE  ABLATION  02/20/2020   AV NODE ABLATION N/A 02/20/2020   Procedure: AV NODE ABLATION;  Surgeon: Evans Lance, MD;  Location: [place] [facility];  Service: Cardiovascular;  Laterality: N/A;   AV NODE ABLATION N/A 12/29/2020   Procedure: AV NODE ABLATION;  Surgeon: Evans Lance, MD;  Location: [place] [facility];  Service: Cardiovascular;  Laterality: N/A;   BIOPSY  07/28/2021   Procedure: BIOPSY;  Surgeon: [name], MD;  Location: [facility];  Service: Endoscopy;;   CARDIAC CATHETERIZATION  08-12-2003  dr Tressia Miners turner   Normal coronary arteries, normal wall motion, no sig. abnormalities   CARDIOVERSION N/A 08/04/2015   Procedure: CARDIOVERSION;  Surgeon: [name], MD;  Location: [place];  Service: Cardiovascular;  Laterality: N/A;   CARDIOVERSION N/A 10/28/2017   Procedure: CARDIOVERSION;  Surgeon: Larey Dresser, MD;  Location: [facility] ENDOSCOPY;  Service: Cardiovascular;  Laterality: N/A;   CARDIOVERSION N/A 04/12/2018   Procedure: CARDIOVERSION;  Surgeon: Thayer Headings, MD;  Location: [facility] ENDOSCOPY;  Service: Cardiovascular;  Laterality: N/A;   CARDIOVERSION N/A 07/26/2018   Procedure: CARDIOVERSION;  Surgeon: Larey Dresser, MD;  Location: [facility] ENDOSCOPY;  Service: Cardiovascular;  Laterality: N/A;   CARDIOVERSION N/A 10/09/2018   Procedure: CARDIOVERSION;  Surgeon: Sueanne Margarita, MD;  Location: [place];  Service: Cardiovascular;  Laterality: N/A;   COLONOSCOPY  07-03-2002   COLONOSCOPY WITH PROPOFOL N/A 07/28/2021   Procedure: COLONOSCOPY WITH PROPOFOL;  Surgeon: [name], MD;  Location: [facility];  Service: Endoscopy;  Laterality: N/A;   COLONOSCOPY WITH PROPOFOL N/A 09/16/2021   Procedure: COLONOSCOPY WITH PROPOFOL;  Surgeon: [name], MD;  Location: [facility];  Service: Endoscopy;  Laterality: N/A;   FOREARM FRACTURE SURGERY  1986   FRACTURE SURGERY     GROIN DISSECTION Left 1992   [place]   SARCOMA SURGERY   INGUINAL HERNIA REPAIR Right 1950s?    INSERT / REPLACE / REMOVE PACEMAKER  02/20/2020   INTESTINAL BYPASS  1976   GASTRIC FOR OBESITY   JOINT REPLACEMENT     PACEMAKER IMPLANT N/A 02/20/2020   Procedure: PACEMAKER IMPLANT;  Surgeon: Evans Lance, MD;  Location: [place] [facility];  Service: Cardiovascular;  Laterality: N/A;   PATELLA FRACTURE SURGERY Left ~ 1995   "broke it twice; only had OR once"   POLYPECTOMY  07/28/2021   Procedure: POLYPECTOMY;  Surgeon: [name], MD;  Location: [facility];  Service: Endoscopy;;   POLYPECTOMY  09/16/2021   Procedure: POLYPECTOMY;  Surgeon: [name], MD;  Location: [facility];  Service: Endoscopy;;   PROSTATE BIOPSY  06/2014   RADIOACTIVE SEED IMPLANT N/A 10/17/2014   Procedure: RADIOACTIVE SEED IMPLANT    ;  Surgeon: Ailene Rud, MD;  Location: [facility];  Service: Urology;  Laterality: N/A;   [facility]    RIGHT HEART CATH N/A 09/04/2020   Procedure: RIGHT HEART CATH;  Surgeon: Jolaine Artist, MD;  Location: [place] [facility];  Service: Cardiovascular;  Laterality: N/A;   TEE WITHOUT CARDIOVERSION N/A 10/28/2017   Procedure: TRANSESOPHAGEAL ECHOCARDIOGRAM (TEE);  Surgeon: Larey Dresser, MD;  Location: [facility] ENDOSCOPY;  Service: Cardiovascular;  Laterality: N/A;   TONSILLECTOMY  1950s   TOTAL KNEE ARTHROPLASTY Right 12/17/2014   Procedure: TOTAL KNEE ARTHROPLASTY;  Surgeon: Melrose Nakayama, MD;  Location: [place];  Service: Orthopedics;  Laterality: Right;   TRANSTHORACIC ECHOCARDIOGRAM  08-08-2003   moderate LVH/  ef 55-65%/  mild MR/  moderate LAE/  trivial TR/  trivial pericardial effusion posterior to the heart   VENTRAL HERNIA REPAIR  1977   VIDEO BRONCHOSCOPY Bilateral 09/21/2016   Procedure: VIDEO BRONCHOSCOPY WITHOUT FLUORO;  Surgeon: Collene Gobble, MD;  Location: [place];  Service: Cardiopulmonary;  Laterality: Bilateral;    Allergies  Allergies  Allergen Reactions   Ace Inhibitors Cough   Xarelto [Rivaroxaban] Other (See  Comments)    Joint pain    History of Present Illness    Eric Mayer is a 81 y.o. male who presents via audio/video conferencing for a telehealth visit today.  Pt was last seen in cardiology clinic on 06/03/21, by Dr. Haroldine Laws.  At that time Eric Mayer was doing well.  he is now pending above surgery.  Since his last visit, he well. He uses walker for ambulation. He is doing PT 2 days/week without any cardiac symptoms. His chronic dyspnea is stable. Denies palpitations, dizziness, LE edema or syncope. Compliant with medications.   Home Medications    Prior to Admission medications   Medication Sig Start Date End Date Taking? Authorizing Provider  acetaminophen (TYLENOL) 500 MG tablet Take 1,000 mg by mouth every 6 (six) hours as needed for headache (pain).    [provider]  amoxicillin (AMOXIL) 500 MG capsule Take 2,000 mg by mouth See admin instructions. Take 4 capsules (2000 mg) by mouth one hour prior to dental appointments    [provider]  bumetanide (BUMEX) 2 MG tablet TAKE ONE TABLET BY MOUTH EVERY MORNING 05/12/21   [name], [name], MD  Cholecalciferol (VITAMIN D3) 125 MCG (5000 UT) TABS Take 5,000 Units by mouth in the morning.    [provider]  clotrimazole (LOTRIMIN) 1 % cream Apply 1 application topically daily as needed (after shower).    [provider]  cyanocobalamin (,VITAMIN B-12,) 1000 MCG/ML injection Inject 1,000 mcg into the muscle every 30 (thirty) days.    [provider]  DOCUSATE CALCIUM PO Take 250 mg by mouth 2 (two) times daily.    [provider]  ELIQUIS 5 MG TABS tablet TAKE ONE TABLET BY MOUTH AT BREAKFAST AND AT BEDTIME 02/11/21   [name], [name], MD  escitalopram (LEXAPRO) 20 MG tablet Take 20 mg by mouth daily.    [provider]  ferrous sulfate 325 (65 FE) MG tablet Take 325 mg by mouth every other day.    [provider]  JARDIANCE 10 MG TABS tablet TAKE ONE  TABLET BY MOUTH EVERY MORNING 09/17/21   [name], [name], MD  magnesium oxide (MAG-OX) 400 (241.3 Mg) MG tablet Take 400-800 mg by mouth See admin instructions. Take 2 tablets (800 mg) by mouth in the morning & take 1 tablet (400 mg) by mouth in the evening. 11/30/20   [provider]  METOLAZONE PO Take by mouth daily as needed (Swelling).    [provider]  metoprolol tartrate (LOPRESSOR) 50 MG tablet TAKE  ONE TABLET BY MOUTH AT [facility] AND AT BEDTIME 04/17/21   Evans Lance, MD  Multiple Vitamins-Minerals (MULTIVITAMIN WITH MINERALS) tablet Take 1 tablet by mouth daily.    [provider]  Multiple Vitamins-Minerals (PRESERVISION AREDS 2 PO) Take 1 capsule by mouth 2 (two) times a day.    [provider]  pantoprazole (PROTONIX) 40 MG tablet Take 40 mg by mouth every morning. 06/22/21   [provider]  Polyethyl Glycol-Propyl Glycol (SYSTANE ULTRA) 0.4-0.3 % SOLN Place 1 drop into both eyes 3 (three) times daily as needed (dry/irritated eyes).    [provider]  potassium chloride SA (KLOR-CON) 20 MEQ tablet Take 1 tablet (20 mEq total) by mouth daily. 12/25/20   Evans Lance, MD  Pramox-PE-Glycerin-Petrolatum (HEMORRHOIDAL) 1-0.25-14.4-15 % CREA Place 1 application rectally daily as needed (Hemorrhoids).    [provider]  Probiotic Product (PROBIOTIC DAILY PO) Take 1 capsule by mouth daily.    [provider]  tamsulosin (FLOMAX) 0.4 MG CAPS capsule Take 0.8 mg by mouth at bedtime.  11/04/16   [provider]  witch hazel-glycerin (TUCKS) pad Apply topically as needed for itching. For external hemorrhoids. Also available OTC. Patient taking differently: Apply 1 application. topically as needed for itching. For external hemorrhoids. Also available OTC. 09/05/20   Rai, Vernelle Emerald, MD    Physical Exam    Vital Signs:  Eric Mayer does not have vital signs available for review today.  Given telephonic  nature of communication, physical exam is limited. AAOx3. NAD. Normal affect.  Speech and respirations are unlabored.  Accessory Clinical Findings    None  Assessment & Plan    1.  Preoperative Cardiovascular Risk Assessment: Given past medical history and time since last visit, based on ACC/AHA guidelines, Eric Mayer would be at acceptable risk for the planned procedure without further cardiovascular testing.   Per pharmacy " Per office protocol, patient can hold Eliquis for 3 days prior to procedure".  The patient was advised that if he develops new symptoms prior to surgery to contact our office to arrange for a follow-up visit, and he verbalized understanding.  I will route this recommendation to the requesting party via Epic fax function and remove from pre-op pool.  Please call with questions.   COVID-19 Education: The signs and symptoms of COVID-19 were discussed with the patient and how to seek care for testing (follow up with PCP or arrange E-visit).  The importance of social distancing was discussed today.  Patient Risk:   After full review of this patient's history and clinical status, I feel that he is at least moderate risk for cardiac complications at this time, thus necessitating a telehealth visit sooner than our first available in office visit.  Time:   Today, I have spent 8 minutes with the patient with telehealth technology discussing medical history, symptoms, and management plan.     [place], [place]  10/21/2021, 2:07 PM

## 2021-10-21 NOTE — Telephone Encounter (Signed)
Patient with diagnosis of A Fib on Eliquis for anticoagulation.   ? ?Procedure:  PROXIMAL HEMICOLECTOMY W/ CHOLESECTOMY  ?Date of procedure: TBD ? ? ?CHA2DS2-VASc Score = 4  ?This indicates a 4.8% annual risk of stroke. ?The patient's score is based upon: ?CHF History: 1 ?HTN History: 1 ?Diabetes History: 0 ?Stroke History: 0 ?Vascular Disease History: 0 ?Age Score: 2 ?Gender Score: 0 ? ?CrCl 57 mL/min using adjusted body weight ?Platelet count 157K ? ? ?Per office protocol, patient can hold Eliquis for 3 days prior to procedure.   ?

## 2021-10-21 NOTE — Telephone Encounter (Signed)
1st attempt to reach pt regarding surgical clearance and the need for a telephone appointment with Pre-Op APP. ?Left a message for pt to call back and ask for the preop team.  ?

## 2021-10-21 NOTE — Telephone Encounter (Signed)
Pt has been scheduled for telephone visit, today, 10/21/21 @ 2:20. ? ?

## 2021-10-21 NOTE — Telephone Encounter (Signed)
? ?  Pre-operative Risk Assessment  ?  ?Patient Name: Eric Mayer  ?DOB: March 08, 1941 ?MRN: [id number]  ? ?  ? ?Request for Surgical Clearance   ? ?Procedure:   PROXIMAL HEMICOLECTOMY W/ CHOLESECTOMY  ? ?Date of Surgery:  Clearance TBD                              ?   ?Surgeon:   ?Surgeon's Group or Practice Name:  [facility] ?Phone number:   [phone] ?Fax number:   [phone] ?  ?Type of Clearance Requested:   ?- Medical  ?- Pharmacy:  Hold Apixaban (Eliquis) NOT INDICATED HOW LONG TO HOLD  ?  ?Type of Anesthesia:  General  ?  ?Additional requests/questions:   ? ?Signed, ?Jeanann Lewandowsky   ?10/21/2021, 10:03 AM  ? ?

## 2021-10-21 NOTE — Telephone Encounter (Signed)
Primary [place], MD ? ?Chart reviewed as part of pre-operative protocol coverage. Because of Eric Mayer's past medical history and time since last visit, he will require a virtual visit/telephone call in order to better assess preoperative cardiovascular risk. ? ?Pre-op covering staff: ?- Please contact patient, obtain consent, and schedule appointment  ? ?This message will also be routed to pharmacy pool for input on holding anticoagulant agent as requested below so that this information is available at time of patient's appointment.  ? ? ?Emmaline Life, NP-C ? ?  ?10/21/2021, 10:21 AM ?[facility] ?[address], [address] ?Office [phone] Fax [phone] ? ?

## 2021-10-22 NOTE — Telephone Encounter (Signed)
Pt saw Robbie Lis, PAC yesterday for pre op clearance, see ov notes ?

## 2021-10-23 DIAGNOSIS — R2681 Unsteadiness on feet: Secondary | ICD-10-CM | POA: Diagnosis not present

## 2021-10-23 DIAGNOSIS — M6281 Muscle weakness (generalized): Secondary | ICD-10-CM | POA: Diagnosis not present

## 2021-10-26 ENCOUNTER — Encounter (HOSPITAL_COMMUNITY): Payer: Self-pay | Admitting: Internal Medicine

## 2021-10-27 DIAGNOSIS — M6281 Muscle weakness (generalized): Secondary | ICD-10-CM | POA: Diagnosis not present

## 2021-10-27 DIAGNOSIS — R2681 Unsteadiness on feet: Secondary | ICD-10-CM | POA: Diagnosis not present

## 2021-10-29 DIAGNOSIS — M6281 Muscle weakness (generalized): Secondary | ICD-10-CM | POA: Diagnosis not present

## 2021-10-29 DIAGNOSIS — R2681 Unsteadiness on feet: Secondary | ICD-10-CM | POA: Diagnosis not present

## 2021-11-02 ENCOUNTER — Other Ambulatory Visit: Payer: Self-pay

## 2021-11-02 ENCOUNTER — Emergency Department (HOSPITAL_COMMUNITY): Payer: Medicare Other

## 2021-11-02 ENCOUNTER — Observation Stay (HOSPITAL_COMMUNITY)
Admission: EM | Admit: 2021-11-02 | Discharge: 2021-11-06 | Disposition: A | Discharge status: Home / Self Care | Payer: Medicare Other | Attending: Internal Medicine | Admitting: Internal Medicine

## 2021-11-02 DIAGNOSIS — I130 Hypertensive heart and chronic kidney disease with heart failure and stage 1 through stage 4 chronic kidney disease, or unspecified chronic kidney disease: Secondary | ICD-10-CM | POA: Insufficient documentation

## 2021-11-02 DIAGNOSIS — Z950 Presence of cardiac pacemaker: Secondary | ICD-10-CM | POA: Diagnosis not present

## 2021-11-02 DIAGNOSIS — I4819 Other persistent atrial fibrillation: Secondary | ICD-10-CM | POA: Diagnosis present

## 2021-11-02 DIAGNOSIS — K625 Hemorrhage of anus and rectum: Principal | ICD-10-CM | POA: Insufficient documentation

## 2021-11-02 DIAGNOSIS — Z7984 Long term (current) use of oral hypoglycemic drugs: Secondary | ICD-10-CM | POA: Diagnosis not present

## 2021-11-02 DIAGNOSIS — K6389 Other specified diseases of intestine: Secondary | ICD-10-CM | POA: Diagnosis not present

## 2021-11-02 DIAGNOSIS — Z79899 Other long term (current) drug therapy: Secondary | ICD-10-CM | POA: Insufficient documentation

## 2021-11-02 DIAGNOSIS — G4730 Sleep apnea, unspecified: Secondary | ICD-10-CM | POA: Diagnosis present

## 2021-11-02 DIAGNOSIS — J90 Pleural effusion, not elsewhere classified: Secondary | ICD-10-CM | POA: Diagnosis not present

## 2021-11-02 DIAGNOSIS — J9811 Atelectasis: Secondary | ICD-10-CM | POA: Diagnosis not present

## 2021-11-02 DIAGNOSIS — D120 Benign neoplasm of cecum: Secondary | ICD-10-CM | POA: Diagnosis not present

## 2021-11-02 DIAGNOSIS — K635 Polyp of colon: Secondary | ICD-10-CM | POA: Diagnosis present

## 2021-11-02 DIAGNOSIS — R531 Weakness: Secondary | ICD-10-CM | POA: Diagnosis not present

## 2021-11-02 DIAGNOSIS — I10 Essential (primary) hypertension: Secondary | ICD-10-CM | POA: Diagnosis present

## 2021-11-02 DIAGNOSIS — I5032 Chronic diastolic (congestive) heart failure: Secondary | ICD-10-CM | POA: Diagnosis not present

## 2021-11-02 DIAGNOSIS — Z96651 Presence of right artificial knee joint: Secondary | ICD-10-CM | POA: Diagnosis not present

## 2021-11-02 DIAGNOSIS — Z20822 Contact with and (suspected) exposure to covid-19: Secondary | ICD-10-CM | POA: Diagnosis not present

## 2021-11-02 DIAGNOSIS — R42 Dizziness and giddiness: Secondary | ICD-10-CM | POA: Diagnosis not present

## 2021-11-02 DIAGNOSIS — K922 Gastrointestinal hemorrhage, unspecified: Secondary | ICD-10-CM

## 2021-11-02 DIAGNOSIS — R779 Abnormality of plasma protein, unspecified: Secondary | ICD-10-CM | POA: Diagnosis not present

## 2021-11-02 DIAGNOSIS — R778 Other specified abnormalities of plasma proteins: Secondary | ICD-10-CM | POA: Diagnosis present

## 2021-11-02 DIAGNOSIS — N1831 Chronic kidney disease, stage 3a: Secondary | ICD-10-CM | POA: Diagnosis not present

## 2021-11-02 DIAGNOSIS — Z7901 Long term (current) use of anticoagulants: Secondary | ICD-10-CM | POA: Diagnosis not present

## 2021-11-02 DIAGNOSIS — I482 Chronic atrial fibrillation, unspecified: Secondary | ICD-10-CM | POA: Diagnosis present

## 2021-11-02 DIAGNOSIS — Z9884 Bariatric surgery status: Secondary | ICD-10-CM

## 2021-11-02 DIAGNOSIS — I480 Paroxysmal atrial fibrillation: Secondary | ICD-10-CM | POA: Insufficient documentation

## 2021-11-02 DIAGNOSIS — D500 Iron deficiency anemia secondary to blood loss (chronic): Secondary | ICD-10-CM | POA: Diagnosis present

## 2021-11-02 DIAGNOSIS — Z743 Need for continuous supervision: Secondary | ICD-10-CM | POA: Diagnosis not present

## 2021-11-02 DIAGNOSIS — I517 Cardiomegaly: Secondary | ICD-10-CM | POA: Diagnosis not present

## 2021-11-02 DIAGNOSIS — K8020 Calculus of gallbladder without cholecystitis without obstruction: Secondary | ICD-10-CM | POA: Diagnosis present

## 2021-11-02 DIAGNOSIS — R58 Hemorrhage, not elsewhere classified: Secondary | ICD-10-CM | POA: Diagnosis not present

## 2021-11-02 LAB — CBC WITH DIFFERENTIAL/PLATELET
Abs Immature Granulocytes: 0.02 10*3/uL (ref 0.00–0.07)
Basophils Absolute: 0 10*3/uL (ref 0.0–0.1)
Basophils Relative: 1 %
Eosinophils Absolute: 0.1 10*3/uL (ref 0.0–0.5)
Eosinophils Relative: 2 %
HCT: 40.2 % (ref 39.0–52.0)
Hemoglobin: 12.4 g/dL — ABNORMAL LOW (ref 13.0–17.0)
Immature Granulocytes: 0 %
Lymphocytes Relative: 32 %
Lymphs Abs: 2.4 10*3/uL (ref 0.7–4.0)
MCH: 28.4 pg (ref 26.0–34.0)
MCHC: 30.8 g/dL (ref 30.0–36.0)
MCV: 92 fL (ref 80.0–100.0)
Monocytes Absolute: 0.5 10*3/uL (ref 0.1–1.0)
Monocytes Relative: 7 %
Neutro Abs: 4.5 10*3/uL (ref 1.7–7.7)
Neutrophils Relative %: 58 %
Platelets: 158 10*3/uL (ref 150–400)
RBC: 4.37 MIL/uL (ref 4.22–5.81)
RDW: 14.9 % (ref 11.5–15.5)
WBC: 7.6 10*3/uL (ref 4.0–10.5)
nRBC: 0 % (ref 0.0–0.2)

## 2021-11-02 LAB — COMPREHENSIVE METABOLIC PANEL
ALT: 11 U/L (ref 0–44)
AST: 15 U/L (ref 15–41)
Albumin: 2.9 g/dL — ABNORMAL LOW (ref 3.5–5.0)
Alkaline Phosphatase: 80 U/L (ref 38–126)
Anion gap: 7 (ref 5–15)
BUN: 37 mg/dL — ABNORMAL HIGH (ref 8–23)
CO2: 34 mmol/L — ABNORMAL HIGH (ref 22–32)
Calcium: 9 mg/dL (ref 8.9–10.3)
Chloride: 96 mmol/L — ABNORMAL LOW (ref 98–111)
Creatinine, Ser: 1.34 mg/dL — ABNORMAL HIGH (ref 0.61–1.24)
GFR, Estimated: 54 mL/min — ABNORMAL LOW (ref >60)
Glucose, Bld: 111 mg/dL — ABNORMAL HIGH (ref 70–99)
Potassium: 4.4 mmol/L (ref 3.5–5.1)
Sodium: 137 mmol/L (ref 135–145)
Total Bilirubin: 0.8 mg/dL (ref 0.3–1.2)
Total Protein: 5.1 g/dL — ABNORMAL LOW (ref 6.5–8.1)

## 2021-11-02 LAB — POC OCCULT BLOOD, ED: Fecal Occult Bld: POSITIVE — AB

## 2021-11-02 LAB — RESP PANEL BY RT-PCR (FLU A&B, COVID) ARPGX2
Influenza A by PCR: NEGATIVE
Influenza B by PCR: NEGATIVE
SARS Coronavirus 2 by RT PCR: NEGATIVE

## 2021-11-02 LAB — PROTIME-INR
INR: 1.3 — ABNORMAL HIGH (ref 0.8–1.2)
Prothrombin Time: 16.1 seconds — ABNORMAL HIGH (ref 11.4–15.2)

## 2021-11-02 LAB — LIPASE, BLOOD: Lipase: 46 U/L (ref 11–51)

## 2021-11-02 LAB — BRAIN NATRIURETIC PEPTIDE: B Natriuretic Peptide: 638.8 pg/mL — ABNORMAL HIGH (ref 0.0–100.0)

## 2021-11-02 LAB — TROPONIN I (HIGH SENSITIVITY)
Troponin I (High Sensitivity): 65 ng/L — ABNORMAL HIGH (ref <18)
Troponin I (High Sensitivity): 60 ng/L — ABNORMAL HIGH (ref <18)

## 2021-11-02 MED ORDER — INSULIN ASPART 100 UNIT/ML IJ SOLN: 0.0000 [IU] | INTRAMUSCULAR | Status: DC

## 2021-11-02 MED ORDER — ESCITALOPRAM OXALATE 20 MG PO TABS
20.0000 mg | ORAL_TABLET | Freq: Every day | ORAL | Status: DC
Start: 2021-11-03 — End: 2021-11-06
  Administered 2021-11-03 – 2021-11-06 (×4): 20 mg via ORAL
  Filled 2021-11-02 (×4): qty 1

## 2021-11-02 MED ORDER — TAMSULOSIN HCL 0.4 MG PO CAPS
0.8000 mg | ORAL_CAPSULE | Freq: Every day | ORAL | Status: DC
  Administered 2021-11-02 – 2021-11-05 (×4): 0.8 mg via ORAL
  Filled 2021-11-02 (×4): qty 2

## 2021-11-02 MED ORDER — ACETAMINOPHEN 325 MG PO TABS: 650.0000 mg | ORAL_TABLET | Freq: Four times a day (QID) | ORAL | Status: DC | PRN

## 2021-11-02 MED ORDER — PANTOPRAZOLE SODIUM 40 MG PO TBEC
40.0000 mg | DELAYED_RELEASE_TABLET | Freq: Every morning | ORAL | Status: DC
  Administered 2021-11-03 – 2021-11-06 (×4): 40 mg via ORAL
  Filled 2021-11-02 (×4): qty 1

## 2021-11-02 MED ORDER — METOPROLOL TARTRATE 50 MG PO TABS
50.0000 mg | ORAL_TABLET | Freq: Two times a day (BID) | ORAL | Status: DC
Start: 2021-11-03 — End: 2021-11-06
  Administered 2021-11-03 – 2021-11-06 (×2): 50 mg via ORAL
  Filled 2021-11-02 (×7): qty 1

## 2021-11-02 MED ORDER — ONDANSETRON HCL 4 MG/2ML IJ SOLN: 4.0000 mg | Freq: Four times a day (QID) | INTRAMUSCULAR | Status: DC | PRN

## 2021-11-02 MED ORDER — METOPROLOL TARTRATE 50 MG PO TABS: 50.0000 mg | ORAL_TABLET | Freq: Two times a day (BID) | ORAL | Status: DC

## 2021-11-02 MED ORDER — ONDANSETRON HCL 4 MG PO TABS
4.0000 mg | ORAL_TABLET | Freq: Four times a day (QID) | ORAL | Status: DC | PRN
Start: 2021-11-02 — End: 2021-11-06

## 2021-11-02 MED ORDER — POLYVINYL ALCOHOL 1.4 % OP SOLN
1.0000 [drp] | OPHTHALMIC | Status: DC | PRN
  Filled 2021-11-02: qty 15

## 2021-11-02 MED ORDER — ACETAMINOPHEN 650 MG RE SUPP: 650.0000 mg | Freq: Four times a day (QID) | RECTAL | Status: DC | PRN

## 2021-11-02 MED ORDER — EMPAGLIFLOZIN 10 MG PO TABS
10.0000 mg | ORAL_TABLET | Freq: Every morning | ORAL | Status: DC
  Administered 2021-11-03 – 2021-11-06 (×4): 10 mg via ORAL
  Filled 2021-11-02 (×4): qty 1

## 2021-11-02 MED ORDER — POLYETHYL GLYCOL-PROPYL GLYCOL 0.4-0.3 % OP SOLN
1.0000 [drp] | Freq: Three times a day (TID) | OPHTHALMIC | Status: DC | PRN
Start: 2021-11-02 — End: 2021-11-02

## 2021-11-02 MED ORDER — FERROUS SULFATE 325 (65 FE) MG PO TABS
325.0000 mg | ORAL_TABLET | ORAL | Status: DC
  Administered 2021-11-03 – 2021-11-05 (×2): 325 mg via ORAL
  Filled 2021-11-02 (×3): qty 1

## 2021-11-02 NOTE — Progress Notes (Signed)
Patient refused CPAP for tonight. RT instructed pt to have RT called if he changes his mind. RT will monitor as needed. 

## 2021-11-02 NOTE — ED Triage Notes (Signed)
Pt BIB [facility] after calling for significant GI bleeding with dripping blood from his rectum. Patient endorses lightheadedness and dizziness. Patient takes eliquis, hx of hemorrhoids. EMS reports VS [facility]. ?

## 2021-11-02 NOTE — Assessment & Plan Note (Signed)
Hold eliquis in setting of GIB ?

## 2021-11-02 NOTE — Assessment & Plan Note (Signed)
Chronic, stable, baseline. 

## 2021-11-02 NOTE — Assessment & Plan Note (Signed)
Cont CPAP

## 2021-11-02 NOTE — Assessment & Plan Note (Signed)
Cont rate control with metoprolol. ?Hold eliquis ?

## 2021-11-02 NOTE — ED Provider Notes (Signed)
?[facility] ?Provider Note ? ? ?CSN: [id number] ?Arrival date & time: 11/02/21  1454 ? ?  ? ?History ? ?Chief Complaint  ?Patient presents with  ? Rectal Bleeding  ? ? ?Eric Mayer is a 81 y.o. male. ? ?HPI ? 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  Patient states that he has intermittent rectal bleeding at baseline secondary to polyps.  He recently had a polypectomy back during his colonoscopy a little over a month ago.  He woke up this morning soaked in bright red blood with clots.  Had small amount of bleeding after this but currently no ongoing bleeding.  He states that he has acute on chronic abdominal pain that is unchanged.  Denies any fever.  Last dose of Eliquis was last night, he called his GI doctor who referred him to the ER and had them hold his morning Eliquis dose.  He is endorsing fatigue, positional lightheadedness and shortness of breath.  No significant swelling of his lower extremities. ? ?Home Medications ?Prior to Admission medications   ?Medication Sig Start Date End Date Taking? Authorizing Provider  ?acetaminophen (TYLENOL) 500 MG tablet Take 1,000 mg by mouth every 6 (six) hours as needed for headache (pain).    [provider]  ?amoxicillin (AMOXIL) 500 MG capsule Take 2,000 mg by mouth See admin instructions. Take 4 capsules (2000 mg) by mouth one hour prior to dental appointments    [provider]  ?bumetanide (BUMEX) 2 MG tablet TAKE ONE TABLET BY MOUTH EVERY MORNING 05/12/21   [name], [name], MD  ?Cholecalciferol (VITAMIN D3) 125 MCG (5000 UT) TABS Take 5,000 Units by mouth in the morning.    [provider]  ?clotrimazole (LOTRIMIN) 1 % cream Apply 1 application topically daily as needed (after shower).    [provider]  ?cyanocobalamin (,VITAMIN B-12,) 1000 MCG/ML injection Inject 1,000 mcg into the muscle every 30 (thirty) days.    [provider]  ?DOCUSATE CALCIUM PO Take 250 mg by mouth 2 (two) times daily.    [provider]  ?ELIQUIS 5 MG TABS tablet TAKE ONE TABLET BY MOUTH AT [facility] AND AT BEDTIME 02/11/21   [name], [name], MD  ?escitalopram (LEXAPRO) 20 MG tablet Take 20 mg by mouth daily.    [provider]  ?ferrous sulfate 325 (65 FE) MG tablet Take 325 mg by mouth every other day.    [provider]  ?JARDIANCE 10 MG TABS tablet TAKE ONE TABLET BY MOUTH EVERY MORNING 09/17/21   [name], [name], MD  ?magnesium oxide (MAG-OX) 400 (241.3 Mg) MG tablet Take 400-800 mg by mouth See admin instructions. Take 2 tablets (800 mg) by mouth in the morning & take 1 tablet (400 mg) by mouth in the evening. 11/30/20   [provider]  ?METOLAZONE PO Take by mouth daily as needed (Swelling).    [provider]  ?metoprolol tartrate (LOPRESSOR) 50 MG tablet TAKE ONE TABLET BY MOUTH AT [facility] AND AT BEDTIME 04/17/21   Evans Lance, MD  ?Multiple Vitamins-Minerals (MULTIVITAMIN WITH MINERALS) tablet Take 1 tablet by mouth daily.    [provider]  ?Multiple Vitamins-Minerals (PRESERVISION AREDS 2 PO) Take 1 capsule by mouth 2 (two) times a day.    [provider]  ?pantoprazole (PROTONIX) 40 MG tablet Take  40 mg by mouth every morning. 06/22/21   [provider]  ?Polyethyl Glycol-Propyl Glycol (SYSTANE ULTRA) 0.4-0.3 % SOLN Place 1 drop into both eyes 3 (three) times daily as needed (dry/irritated eyes).    [provider]  ?potassium chloride SA (KLOR-CON) 20 MEQ tablet Take 1 tablet (20 mEq total) by mouth daily. 12/25/20   Evans Lance, MD  ?Pramox-PE-Glycerin-Petrolatum (HEMORRHOIDAL) 1-0.25-14.4-15 % CREA Place 1 application rectally daily as needed (Hemorrhoids).    [provider]  ?Probiotic Product  (PROBIOTIC DAILY PO) Take 1 capsule by mouth daily.    [provider]  ?tamsulosin (FLOMAX) 0.4 MG CAPS capsule Take 0.8 mg by mouth at bedtime.  11/04/16   [provider]  ?witch hazel-glycerin (TUCKS) pad Apply topically as needed for itching. For external hemorrhoids. Also available OTC. ?Patient taking differently: Apply 1 application. topically as needed for itching. For external hemorrhoids. Also available OTC. 09/05/20   Rai, Vernelle Emerald, MD  ?   ? ?Allergies    ?Ace inhibitors and Xarelto [rivaroxaban]   ? ?Review of Systems   ?Review of Systems  ?Constitutional:  Positive for fatigue. Negative for fever.  ?Respiratory:  Positive for shortness of breath.   ?Cardiovascular:  Negative for chest pain.  ?Gastrointestinal:  Positive for anal bleeding. Negative for abdominal pain, diarrhea and vomiting.  ?Skin:  Negative for rash.  ?Neurological:  Positive for light-headedness. Negative for headaches.  ? ?Physical Exam ?Updated Vital Signs ?BP 106/77   Pulse 75   Temp 98.2 ?F (36.8 ?C) (Oral)   Resp 10   SpO2 98%  ?Physical Exam ?Vitals and nursing note reviewed.  ?Constitutional:   ?   General: He is not in acute distress. ?   Appearance: Normal appearance. He is not diaphoretic.  ?HENT:  ?   Head: Normocephalic.  ?   Mouth/Throat:  ?   Mouth: Mucous membranes are moist.  ?Cardiovascular:  ?   Rate and Rhythm: Normal rate.  ?Pulmonary:  ?   Effort: Pulmonary effort is normal. No respiratory distress.  ?Abdominal:  ?   Palpations: Abdomen is soft.  ?   Tenderness: There is no abdominal tenderness. There is no guarding.  ?Genitourinary: ?   Comments: Dried red/maroon blood around the rectum ?Skin: ?   General: Skin is warm.  ?Neurological:  ?   Mental Status: He is alert and oriented to person, place, and time. Mental status is at baseline.  ?Psychiatric:     ?   Mood and Affect: Mood normal.  ? ? ?ED Results / Procedures / Treatments   ?Labs ?(all labs ordered are listed, but only abnormal  results are displayed) ?Labs Reviewed  ?RESP PANEL BY RT-PCR (FLU A&B, COVID) ARPGX2  ?CBC WITH DIFFERENTIAL/PLATELET  ?COMPREHENSIVE METABOLIC PANEL  ?LIPASE, BLOOD  ?PROTIME-INR  ?BRAIN NATRIURETIC PEPTIDE  ?POC OCCULT BLOOD, ED  ?TROPONIN I (HIGH SENSITIVITY)  ? ? ?EKG ?EKG Interpretation ? ?Date/Time:  Monday November 02 2021 15:52:46 EDT ?Ventricular Rate:  79 ?PR Interval:  62 ?QRS Duration: 118 ?QT Interval:  438 ?QTC Calculation: 503 ?R Axis:   -40 ?Text Interpretation: Sinus rhythm Ventricular premature complex Short PR interval Incomplete right bundle branch block LVH with IVCD, LAD and secondary repol abnrm Inferior infarct, old Prolonged QT interval T wave inversion I,? aVL Confirmed by Lavenia Atlas [phone]) on 11/02/2021 3:58:13 PM ? ?Radiology ?DG Chest Port 1 View ? ?Result Date: 11/02/2021 ?CLINICAL DATA:  Shortness of breath.  GI bleed.  Dizziness. EXAM:  PORTABLE CHEST 1 VIEW COMPARISON:  09/18/2018 FINDINGS: Left-sided pacemaker in place with leads in the right atrium and ventricle. Lung volumes are low. Cardiomegaly is similar. Unchanged mediastinal contours. There are small bilateral pleural effusions that are not significantly changed from prior exam. Trace fluid in the right minor fissure. Bibasilar atelectasis, right greater than left. No pulmonary edema. No focal opacity in the upper lung zones. No pneumothorax. No acute osseous abnormalities are seen. IMPRESSION: Low lung volumes with cardiomegaly and small bilateral pleural effusions. Overall findings are not significantly changed from prior exam allowing for differences in positioning. Electronically Signed   By: Keith Rake M.D.   On: 11/02/2021 16:15   ? ?Procedures ?Procedures  ? ? ?Medications Ordered in ED ?Medications - No data to display ? ?ED Course/ Medical Decision Making/ A&P ?  ?                        ?Medical Decision Making ?Amount and/or Complexity of Data Reviewed ?Labs: ordered. ?Radiology: ordered. ? ? ?81 year old male  presents to the emergency department for concern of rectal bleeding in the setting of anticoagulation.  Vitals are stable on arrival, there is dried blood noted at the rectum.  Also endorsing symptoms of anemia/CHF.

## 2021-11-02 NOTE — Assessment & Plan Note (Signed)
Holding diuretics in setting of GIB. ?Med rec pending. ?

## 2021-11-02 NOTE — ED Notes (Signed)
Admitting and Pharmacy at bedside. ?

## 2021-11-02 NOTE — ED Provider Notes (Signed)
?  Physical Exam  ?BP 100/66 (BP Location: Right Arm)   Pulse 68   Temp 98.1 ?F (36.7 ?C) (Oral)   Resp 18   SpO2 96%  ? ?Physical Exam ?Vitals and nursing note reviewed.  ?Constitutional:   ?   General: He is not in acute distress. ?   Appearance: He is well-developed.  ?HENT:  ?   Head: Normocephalic and atraumatic.  ?Eyes:  ?   Conjunctiva/sclera: Conjunctivae normal.  ?Cardiovascular:  ?   Rate and Rhythm: Normal rate and regular rhythm.  ?   Heart sounds: No murmur heard. ?Pulmonary:  ?   Effort: Pulmonary effort is normal. No respiratory distress.  ?   Breath sounds: Normal breath sounds.  ?Abdominal:  ?   Palpations: Abdomen is soft.  ?   Tenderness: There is no abdominal tenderness.  ?Musculoskeletal:     ?   General: No swelling.  ?   Cervical back: Neck supple.  ?Skin: ?   General: Skin is warm and dry.  ?   Capillary Refill: Capillary refill takes less than 2 seconds.  ?   Coloration: Skin is pale.  ?Neurological:  ?   Mental Status: He is alert.  ?Psychiatric:     ?   Mood and Affect: Mood normal.  ? ? ?Procedures  ?Procedures ? ?ED Course / MDM  ?  ?Medical Decision Making ?Amount and/or Complexity of Data Reviewed ?Labs: ordered. ?Radiology: ordered. ? ?Risk ?Decision regarding hospitalization. ? ? ?Patient received in handoff.  History of cecal mass, recent polypectomy.  Woke up in a pool of rectal bleeding this morning.  Last dose of Eliquis last night.  Labs with a hemoglobin of 12.4.  Patient does appear to be symptomatic from an anemia standpoint.  Spoke with [facility] who recommends overnight observation with medicine and they will evaluate the patient tonight or tomorrow morning. ? ? ? ? ?  ?Teressa Lower, MD ?11/02/21 2211 ? ?

## 2021-11-02 NOTE — Assessment & Plan Note (Signed)
H/o same, chronic trop elevation it looks like. ?

## 2021-11-02 NOTE — Assessment & Plan Note (Signed)
Currently scheduled for hemicolectomy in May. ?May want to see if gen surg can move that up if we think this is bleeding source. ?

## 2021-11-02 NOTE — Assessment & Plan Note (Addendum)
BRBPR likely due to bleeding polyps / cecal mass. ?1. Hold eliquis ?2. H/H Q6H ?3. EDP contacting GI - [facility] will see in AM ?4. See discussion of cecal mass below - I guess question is, can this be moved up if we think that's where bleeding coming from? ?1. May want to get gen surg consult in AM. ?2. Ill send a page to Dr. Johney Maine who happens to be on call for [facility] this evening. ?

## 2021-11-02 NOTE — Assessment & Plan Note (Addendum)
Hold diuretics in setting of GIB. ?Cont jardiance ?Cont lopressor ?

## 2021-11-02 NOTE — H&P (Signed)
?History and Physical  ? ? ?Patient: Eric Mayer PYP:[id number] DOB: 1941/03/26 ?DOA: 11/02/2021 ?DOS: the patient was seen and examined on 11/02/2021 ?PCP: [name], MD  ?Patient coming from: Home ? ?Chief Complaint:  ?Chief Complaint  ?Patient presents with  ? Rectal Bleeding  ? ?HPI: Eric Mayer is a 81 y.o. male with medical history significant of dCHF, A.Fib on eliquis, CKD 3a. ? ?Pt presents to ED for rectal bleeding. ? ?Intermittent rectal bleeding at baseline due to polyps. ? ?Polypectomy at colonoscopy just over 1 month ago. ? ?Has large cecal mass / polyp that is scheduled for hemicolectomy in May. ? ?He woke up this morning soaked in bright red blood with clots.  Had small amount of bleeding after this but currently no ongoing bleeding.  He states that he has acute on chronic abdominal pain that is unchanged.  Denies any fever.  Last dose of Eliquis was last night, he called his GI doctor who referred him to the ER and had them hold his morning Eliquis dose.  ? ?Has fatigue, positional lightheadedness and shortness of breath.  No significant swelling of his lower extremities. ?  ?Review of Systems: As mentioned in the history of present illness. All other systems reviewed and are negative. ?Past Medical History:  ?Diagnosis Date  ? Anemia 1980s X 1  ? Arthritis   ? "hands, knees, hips, ankles" (07/31/2015)  ? Atrial flutter ([place]) 07/31/2015  ? CHF (congestive heart failure) ([place])   ? Chronic diastolic heart failure ([place])   ? a. 11/2015: Echo w/ EF of 60-65%, no WMA, Grade 2 DD, trivial AR, ascending aorta mildly dilated.   ? Depression   ? History of cardiovascular stress test   ? a. 03/2015: Dobutamine Stress Echo with no evidence of ischemia.   ? History of gastric bypass   ? History of peptic ulcer   ? 1980's  ? History of radiation therapy 1993  ? sarcoma of left groin, tx at [facility]  ? History of sarcoma   ? 1993  LEFT GOIN--  S/P SURGERY, RADIATION AND CHEMO IN [place]  ? Hypertension    ? Hypogonadism in male   ? Incomplete right bundle branch block   ? Nerve injury   ? 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 Nocturia   ? OSA on CPAP   ? Persistent atrial fibrillation ([place])   ?    ? 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 Primary prostate adenocarcinoma ([place]) DX 07/08/14  ? Gleason 7,  stage T1c  ? PVC's (premature ventricular contractions) 11/21/2015  ? 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 "of groin"  ? Weakness of left leg   ? SECONDARY TO SURGICAL NERVE INJURY OF LEFT GOIN  ? Wears glasses   ? ?Past Surgical History:  ?Procedure Laterality Date  ? ATRIAL FIBRILLATION ABLATION N/A 06/08/2018  ? Procedure: ATRIAL FIBRILLATION ABLATION;  Surgeon: Thompson Grayer, MD;  Location: [place] [facility];  Service: Cardiovascular;  Laterality: N/A;  ? ATRIAL FIBRILLATION ABLATION N/A 12/20/2018  ? Procedure: ATRIAL FIBRILLATION ABLATION;  Surgeon: Thompson Grayer, MD;  Location: [place] [facility];  Service: Cardiovascular;  Laterality: N/A;  ? AV NODE ABLATION  02/20/2020  ? AV NODE ABLATION N/A 02/20/2020  ? Procedure: AV NODE ABLATION;  Surgeon: Evans Lance, MD;  Location: [place] [facility];  Service: Cardiovascular;  Laterality: N/A;  ? AV  NODE ABLATION N/A 12/29/2020  ? Procedure: AV NODE ABLATION;  Surgeon: Evans Lance, MD;  Location: [place] [facility];  Service: Cardiovascular;  Laterality: N/A;  ? BIOPSY  07/28/2021  ? Procedure: BIOPSY;  Surgeon: [name], MD;  Location: [facility];  Service: Endoscopy;;  ? CARDIAC CATHETERIZATION  08-12-2003  dr Tressia Miners turner  ? Normal coronary arteries, normal wall motion, no sig. abnormalities  ? CARDIOVERSION N/A 08/04/2015  ? Procedure: CARDIOVERSION;  Surgeon: [name], MD;  Location: [place];  Service: Cardiovascular;  Laterality: N/A;  ? CARDIOVERSION N/A 10/28/2017  ? Procedure: CARDIOVERSION;  Surgeon: Larey Dresser, MD;  Location: [facility] ENDOSCOPY;  Service:  Cardiovascular;  Laterality: N/A;  ? CARDIOVERSION N/A 04/12/2018  ? Procedure: CARDIOVERSION;  Surgeon: Thayer Headings, MD;  Location: [place];  Service: Cardiovascular;  Laterality: N/A;  ? CARDIOVERSION N/A 07/26/2018  ? Procedure: CARDIOVERSION;  Surgeon: Larey Dresser, MD;  Location: [facility] ENDOSCOPY;  Service: Cardiovascular;  Laterality: N/A;  ? CARDIOVERSION N/A 10/09/2018  ? Procedure: CARDIOVERSION;  Surgeon: Sueanne Margarita, MD;  Location: [facility] ENDOSCOPY;  Service: Cardiovascular;  Laterality: N/A;  ? COLONOSCOPY  07-03-2002  ? COLONOSCOPY WITH PROPOFOL N/A 07/28/2021  ? Procedure: COLONOSCOPY WITH PROPOFOL;  Surgeon: [name], MD;  Location: [facility];  Service: Endoscopy;  Laterality: N/A;  ? COLONOSCOPY WITH PROPOFOL N/A 09/16/2021  ? Procedure: COLONOSCOPY WITH PROPOFOL;  Surgeon: [name], MD;  Location: [facility];  Service: Endoscopy;  Laterality: N/A;  ? [place]  ? FRACTURE SURGERY    ? [place]  ? SARCOMA SURGERY  ? INGUINAL HERNIA REPAIR Right 1950s?  ? INSERT / REPLACE / REMOVE PACEMAKER  02/20/2020  ? INTESTINAL BYPASS  1976  ? GASTRIC FOR OBESITY  ? JOINT REPLACEMENT    ? PACEMAKER IMPLANT N/A 02/20/2020  ? Procedure: PACEMAKER IMPLANT;  Surgeon: Evans Lance, MD;  Location: [place] [facility];  Service: Cardiovascular;  Laterality: N/A;  ? PATELLA FRACTURE SURGERY Left ~ 1995  ? "broke it twice; only had OR once"  ? POLYPECTOMY  07/28/2021  ? Procedure: POLYPECTOMY;  Surgeon: [name], MD;  Location: [facility];  Service: Endoscopy;;  ? POLYPECTOMY  09/16/2021  ? Procedure: POLYPECTOMY;  Surgeon: [name], MD;  Location: [facility];  Service: Endoscopy;;  ? PROSTATE BIOPSY  06/2014  ? RADIOACTIVE SEED IMPLANT N/A 10/17/2014  ? Procedure: RADIOACTIVE SEED IMPLANT    ;  Surgeon: Ailene Rud, MD;  Location[facility];  Service: Urology;  Laterality: N/A;   68 SEEDS IMPLANTED ?  ? RIGHT HEART CATH  N/A 09/04/2020  ? Procedure: RIGHT HEART CATH;  Surgeon: Jolaine Artist, MD;  Location: [place] [facility];  Service: Cardiovascular;  Laterality: N/A;  ? TEE WITHOUT CARDIOVERSION N/A 10/28/2017  ? Procedure: TRANSESOPHAGEAL ECHOCARDIOGRAM (TEE);  Surgeon: Larey Dresser, MD;  Location: [facility] ENDOSCOPY;  Service: Cardiovascular;  Laterality: N/A;  ? TONSILLECTOMY  1950s  ? TOTAL KNEE ARTHROPLASTY Right 12/17/2014  ? Procedure: TOTAL KNEE ARTHROPLASTY;  Surgeon: Melrose Nakayama, MD;  Location: [place];  Service: Orthopedics;  Laterality: Right;  ? TRANSTHORACIC ECHOCARDIOGRAM  08-08-2003  ? moderate LVH/  ef 55-65%/  mild MR/  moderate LAE/  trivial TR/  trivial pericardial effusion posterior to the heart  ? [place]  ? VIDEO BRONCHOSCOPY Bilateral 09/21/2016  ? Procedure: VIDEO BRONCHOSCOPY WITHOUT FLUORO;  Surgeon: Rose Fillers  Lamonte Sakai, MD;  Location: [place];  Service: Cardiopulmonary;  Laterality: Bilateral;  ? ?Social History:  reports that he has never smoked. He has never used smokeless tobacco. He reports that he does not drink alcohol and does not use drugs. ? ?Allergies  ?Allergen Reactions  ? Ace Inhibitors Cough  ? Xarelto [Rivaroxaban] Other (See Comments)  ?  Joint pain  ? ? ?Family History  ?Problem Relation Age of Onset  ? Liver disease Mother   ? Renal Disease Mother   ? Stroke Mother   ? Atrial fibrillation Father   ? Cancer Sister   ? ? ?Prior to Admission medications   ?Medication Sig Start Date End Date Taking? Authorizing Provider  ?acetaminophen (TYLENOL) 500 MG tablet Take 1,000 mg by mouth every 6 (six) hours as needed for headache (pain).    [provider]  ?amoxicillin (AMOXIL) 500 MG capsule Take 2,000 mg by mouth See admin instructions. Take 4 capsules (2000 mg) by mouth one hour prior to dental appointments    [provider]  ?bumetanide (BUMEX) 2 MG tablet TAKE ONE TABLET BY MOUTH EVERY MORNING 05/12/21   [name], [name], MD  ?Cholecalciferol  (VITAMIN D3) 125 MCG (5000 UT) TABS Take 5,000 Units by mouth in the morning.    [provider]  ?clotrimazole (LOTRIMIN) 1 % cream Apply 1 application topically daily as needed (after shower).    Pro

## 2021-11-03 DIAGNOSIS — K922 Gastrointestinal hemorrhage, unspecified: Secondary | ICD-10-CM | POA: Diagnosis not present

## 2021-11-03 DIAGNOSIS — K625 Hemorrhage of anus and rectum: Secondary | ICD-10-CM | POA: Diagnosis not present

## 2021-11-03 LAB — HEMOGLOBIN AND HEMATOCRIT, BLOOD
HCT: 35.8 % — ABNORMAL LOW (ref 39.0–52.0)
HCT: 36.5 % — ABNORMAL LOW (ref 39.0–52.0)
HCT: 36.8 % — ABNORMAL LOW (ref 39.0–52.0)
HCT: 38 % — ABNORMAL LOW (ref 39.0–52.0)
Hemoglobin: 10.9 g/dL — ABNORMAL LOW (ref 13.0–17.0)
Hemoglobin: 11.1 g/dL — ABNORMAL LOW (ref 13.0–17.0)
Hemoglobin: 11.5 g/dL — ABNORMAL LOW (ref 13.0–17.0)
Hemoglobin: 11.7 g/dL — ABNORMAL LOW (ref 13.0–17.0)

## 2021-11-03 LAB — RETICULOCYTES
Immature Retic Fract: 20.3 % — ABNORMAL HIGH (ref 2.3–15.9)
RBC.: 4.08 MIL/uL — ABNORMAL LOW (ref 4.22–5.81)
Retic Count, Absolute: 76.3 10*3/uL (ref 19.0–186.0)
Retic Ct Pct: 1.9 % (ref 0.4–3.1)

## 2021-11-03 LAB — VITAMIN B12: Vitamin B-12: 475 pg/mL (ref 180–914)

## 2021-11-03 LAB — BASIC METABOLIC PANEL
Anion gap: 7 (ref 5–15)
BUN: 35 mg/dL — ABNORMAL HIGH (ref 8–23)
CO2: 35 mmol/L — ABNORMAL HIGH (ref 22–32)
Calcium: 8.9 mg/dL (ref 8.9–10.3)
Chloride: 95 mmol/L — ABNORMAL LOW (ref 98–111)
Creatinine, Ser: 1.41 mg/dL — ABNORMAL HIGH (ref 0.61–1.24)
GFR, Estimated: 51 mL/min — ABNORMAL LOW (ref >60)
Glucose, Bld: 103 mg/dL — ABNORMAL HIGH (ref 70–99)
Potassium: 4.1 mmol/L (ref 3.5–5.1)
Sodium: 137 mmol/L (ref 135–145)

## 2021-11-03 LAB — IRON AND TIBC
Iron: 22 ug/dL — ABNORMAL LOW (ref 45–182)
Saturation Ratios: 7 % — ABNORMAL LOW (ref 17.9–39.5)
TIBC: 307 ug/dL (ref 250–450)
UIBC: 285 ug/dL

## 2021-11-03 LAB — FERRITIN: Ferritin: 16 ng/mL — ABNORMAL LOW (ref 24–336)

## 2021-11-03 LAB — TROPONIN I (HIGH SENSITIVITY): Troponin I (High Sensitivity): 64 ng/L — ABNORMAL HIGH (ref <18)

## 2021-11-03 LAB — FOLATE: Folate: 16 ng/mL (ref >5.9)

## 2021-11-03 MED ORDER — SODIUM CHLORIDE 0.9 % IV SOLN
125.0000 mg | Freq: Once | INTRAVENOUS | Status: AC
  Administered 2021-11-03: 125 mg via INTRAVENOUS
  Filled 2021-11-03 (×2): qty 10

## 2021-11-03 NOTE — Progress Notes (Signed)
Mobility Specialist Progress Note: ? ? 11/03/21 1300  ?Mobility  ?Activity Ambulated with assistance to bathroom  ?Level of Assistance Moderate assist, patient does 50-74%  ?Assistive Device Front wheel walker  ?Distance Ambulated (ft) 20 ft  ?Activity Response Tolerated well  ?$Mobility charge 1 Mobility  ? ?Pt received in BR. Pt ModA+2 to stand from toilet then supervision to get to bed. MinA to stand EOB to get brief on. Left EOB with call bell in reach and all needs met. ? ?Eric Mayer ?Mobility Specialist ?Primary Phone [phone] ? ?

## 2021-11-03 NOTE — Progress Notes (Signed)
Note: ?Portions of this report may have been transcribed using voice recognition software. Every effort was made to ensure accuracy; however, inadvertent computerized transcription errors may be present.   Any transcriptional errors that result from this process are unintentional.   ? ? ? ?  ?     ? ?Eric Mayer  ?[birth year] ?[id number] ? ?Patient Care Team: ?[name], MD as PCP - General (Internal Medicine) ?[name], MD as PCP - Cardiology (Cardiology) ?[name], [name], MD as PCP - Advanced Heart Failure (Cardiology) ?[name], NP as Nurse Practitioner (Nurse Practitioner) ?[name], MD as Consulting Physician (Gastroenterology) ?[name], MD as Consulting Physician (General Surgery) ? ?This patient is a 81 y.o.male who is chronically anticoagulated for numerous cardiac issues including atrial fibrillation and found to have a cecal mass according to Dr. Watt Climes with [facility] gastroenterology.  History of some type of ileocolonic intestinal bypass in the 1970s.  Atypical anatomy.  Was working on cardiac clearance and planning robotic resection in early May.  ? ?Looks like he was considered to not to be a severe risk from a cardiology standpoint who just saw him last week. ? ?Patient awoke with rectal bleeding and was concerned.  Went to the emergency department.  Looks like he is not have any more persistent rectal bleeding.  Last dose of Eliquis 3/19 p.m.  His hemoglobin is actually markedly improved from when I first met him when his hemoglobin was in the sevens.  His hemoglobin went from 12-11 on admission and rehydration.  Most likely they need to hold his Eliquis if cardiology agrees.  No easy answers here but already had some significant iron deficiency anemia from this.  Not a good idea to operate when he is fully anticoagulated & anemic.  I will see if we can move his case up but unfortunately operative times are limited. ? ?Patient Active Problem List  ? Diagnosis Date Noted   ? Mass of cecum 11/02/2021  ?  Priority: Medium   ? History of gastrointestinal tract bypass 10/20/2021  ?  Priority: Medium   ? BRBPR ([place] red blood per rectum) 11/02/2021  ? Ankylosing spondylitis ([place]) 10/20/2021  ? Benign prostatic hyperplasia 10/20/2021  ? Chronic gastritis 10/20/2021  ? Constipation 10/20/2021  ? Gallstones 10/20/2021  ? Chronic atrial fibrillation ([place]) 10/20/2021  ? Polyp of colon 10/20/2021  ? Steatosis of liver 10/20/2021  ? 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 Pernicious anemia 10/20/2021  ? Personal history of colonic polyps 10/20/2021  ? Iron deficiency anemia 10/20/2021  ? 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 Sepsis due to gram-negative urinary tract infection ([place]) 08/31/2020  ? Sepsis due to gram-negative UTI ([place]) 08/30/2020  ? History of malignant neoplasm of prostate 08/30/2020  ? Complicated UTI (urinary tract infection) 08/30/2020  ? Chronic kidney disease, stage 3a ([place]) 08/30/2020  ? Hyperkalemia 08/30/2020  ? Elevated troponin level not due myocardial infarction 08/30/2020  ? Lactic acidosis 08/30/2020  ? 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 Pacemaker 06/03/2020  ? Atrial fibrillation with rapid ventricular response ([place]) 02/20/2020  ? Persistent atrial fibrillation ([place]) 06/08/2018  ? Allergic rhinitis 10/15/2016  ? Intrinsic asthma 09/16/2016  ? Chronic diastolic CHF (congestive heart failure) ([place]) 07/21/2016  ? PVC's (premature ventricular contractions) 11/21/2015  ? Chronic anticoagulation 08/04/2015  ? Essential hypertension 08/04/2015  ? Obesity 08/04/2015  ? Sleep apnea-on C-Pap 08/04/2015  ? Acute on chronic diastolic  heart failure ([place]) 07/31/2015  ? Primary osteoarthritis of right knee 12/17/2014  ? Malignant neoplasm of prostate ([place]) 08/20/2014  ? ? ?Past Medical History:  ?Diagnosis Date  ? Anemia 1980s X 1  ? Arthritis   ? "hands, knees, hips, ankles" (07/31/2015)  ? Atrial  flutter ([place]) 07/31/2015  ? CHF (congestive heart failure) ([place])   ? Chronic diastolic heart failure ([place])   ? a. 11/2015: Echo w/ EF of 60-65%, no WMA, Grade 2 DD, trivial AR, ascending aorta mildly dilated.   ? Depression   ? History of cardiovascular stress test   ? a. 03/2015: Dobutamine Stress Echo with no evidence of ischemia.   ? History of gastric bypass   ? History of peptic ulcer   ? 1980's  ? History of radiation therapy 1993  ? sarcoma of left groin, tx at [facility]  ? History of sarcoma   ? 1993  LEFT GOIN--  S/P SURGERY, RADIATION AND CHEMO IN [place]  ? Hypertension   ? Hypogonadism in male   ? Incomplete right bundle branch block   ? Nerve injury   ? 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 Nocturia   ? OSA on CPAP   ? Persistent atrial fibrillation ([place])   ?    ? 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 Primary prostate adenocarcinoma ([place]) DX 07/08/14  ? Gleason 7,  stage T1c  ? PVC's (premature ventricular contractions) 11/21/2015  ? 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 "of groin"  ? Weakness of left leg   ? SECONDARY TO SURGICAL NERVE INJURY OF LEFT GOIN  ? Wears glasses   ? ? ?Past Surgical History:  ?Procedure Laterality Date  ? ATRIAL FIBRILLATION ABLATION N/A 06/08/2018  ? Procedure: ATRIAL FIBRILLATION ABLATION;  Surgeon: Thompson Grayer, MD;  Location: [place] [facility];  Service: Cardiovascular;  Laterality: N/A;  ? ATRIAL FIBRILLATION ABLATION N/A 12/20/2018  ? Procedure: ATRIAL FIBRILLATION ABLATION;  Surgeon: Thompson Grayer, MD;  Location: [place] [facility];  Service: Cardiovascular;  Laterality: N/A;  ? AV NODE ABLATION  02/20/2020  ? AV NODE ABLATION N/A 02/20/2020  ? Procedure: AV NODE ABLATION;  Surgeon: Evans Lance, MD;  Location: [place] [facility];  Service: Cardiovascular;  Laterality: N/A;  ? AV NODE ABLATION N/A 12/29/2020  ? Procedure: AV NODE ABLATION;  Surgeon: Evans Lance, MD;  Location: [place]  CV LAB;  Service: Cardiovascular;  Laterality: N/A;  ? BIOPSY  07/28/2021  ? Procedure: BIOPSY;  Surgeon: [name], MD;  Location: [facility] ENDOSCOPY;  Service: Endoscopy;;  ? CARDIAC CATHETERIZATION  08-12-2003  dr Tressia Miners turner  ? Normal coronary arteries, normal wall motion, no sig. abnormalities  ? CARDIOVERSION N/A 08/04/2015  ? Procedure: CARDIOVERSION;  Surgeon: [name], MD;  Location: [place];  Service: Cardiovascular;  Laterality: N/A;  ? CARDIOVERSION N/A 10/28/2017  ? Procedure: CARDIOVERSION;  Surgeon: Larey Dresser, MD;  Location: [facility] ENDOSCOPY;  Service: Cardiovascular;  Laterality: N/A;  ? CARDIOVERSION N/A 04/12/2018  ? Procedure: CARDIOVERSION;  Surgeon: Thayer Headings, MD;  Location: [place];  Service: Cardiovascular;  Laterality: N/A;  ? CARDIOVERSION N/A 07/26/2018  ? Procedure: CARDIOVERSION;  Surgeon: Larey Dresser, MD;  Location: [facility] ENDOSCOPY;  Service: Cardiovascular;  Laterality: N/A;  ? CARDIOVERSION N/A 10/09/2018  ? Procedure: CARDIOVERSION;  Surgeon: Sueanne Margarita, MD;  Location: [place];  Service: Cardiovascular;  Laterality:  N/A;  ? COLONOSCOPY  07-03-2002  ? COLONOSCOPY WITH PROPOFOL N/A 07/28/2021  ? Procedure: COLONOSCOPY WITH PROPOFOL;  Surgeon: [name], MD;  Location: [facility] ENDOSCOPY;  Service: Endoscopy;  Laterality: N/A;  ? COLONOSCOPY WITH PROPOFOL N/A 09/16/2021  ? Procedure: COLONOSCOPY WITH PROPOFOL;  Surgeon: [name], MD;  Location: [facility] ENDOSCOPY;  Service: Endoscopy;  Laterality: N/A;  ? [place]  ? FRACTURE SURGERY    ? [place]  ? SARCOMA SURGERY  ? INGUINAL HERNIA REPAIR Right 1950s?  ? INSERT / REPLACE / REMOVE PACEMAKER  02/20/2020  ? INTESTINAL BYPASS  1976  ? GASTRIC FOR OBESITY  ? JOINT REPLACEMENT    ? PACEMAKER IMPLANT N/A 02/20/2020  ? Procedure: PACEMAKER IMPLANT;  Surgeon: Evans Lance, MD;  Location: [place] [facility];  Service: Cardiovascular;  Laterality: N/A;  ? PATELLA  FRACTURE SURGERY Left ~ 1995  ? "broke it twice; only had OR once"  ? POLYPECTOMY  07/28/2021  ? Procedure: POLYPECTOMY;  Surgeon: [name], MD;  Location: [facility];  Service: Endoscopy;;  ? [facility]

## 2021-11-03 NOTE — Progress Notes (Addendum)
Anemia panel confirms low iron and ferritin.  No folate or B12 abnormalities.  I think he would benefit from more aggressive iron supplementation = IV iron replacement.  Usually do IV dextran but due to supply issues we will use IV ferrous gluconate that had been recommended by pharmacy in the past.  Less risk of leak & other problems if anemia corrected. ? ?Suspect patient needs to be off Eliquis until surgery given recurrent bleeds BUT I DEFER TO PRIMARY SERVICE IN CONJUNCTION WITH CARDIOLOGY given stroke/MI risks off anticoagulation  ? ?D/w Dr Michail Sermon covering for [facility] GI.  We both agree that bleeding is due to being fully anticoagulated & should continue to wane / stabilize >72hrs off Eliquis.   ? ?Patient not in hemorrhagic shock, so hold off on any aggressive surgical intervention at this time. Probable OK to back off on Hgb checks from q6h to q12h.  ? ?OK to adv diet from my standpoint ? ? Trying to see if schedulers can move surgical time up sooner into April.  ? ?Adin Hector, MD, FACS, MASCRS ?Esophageal, Gastrointestinal & Colorectal Surgery ?Robotic and Minimally Invasive Surgery ? ?[facility] ?[facility] [place] [address] ?[place], [place] [address] ?[phone] Fax ?[phone] Main ? ?CONTACT INFORMATION: ? ?Weekday (9AM-5PM): Call [facility] main office at [phone] ? ?Weeknight (5PM-9AM) or Weekend/Holiday: Check [email or web address] (password " [email or web address]") for General Surgery [facility] coverage ? ?(Please, do not use SecureChat as it is not reliable communication to operating surgeons for immediate patient care) ?  ? ? ?

## 2021-11-03 NOTE — Progress Notes (Signed)
Patient refused CPAP for tonight. RT instructed pt to have RT called if he changes his mind. RT will monitor as needed. 

## 2021-11-03 NOTE — Progress Notes (Signed)
?PROGRESS NOTE ? ?Eric Mayer  DEY:[id number] DOB: April 26, 1941 DOA: 11/02/2021 ?PCP: [name], MD  ?Outpatient Specialists:  ? ?Brief Narrative:  ?81 year old Caucasian male with history of bleeding polyps/cecal mass, awaiting hemicolectomy by the surgical team.  We will do general surgery is planned for Dec 17, 2021.  Surgical team has been consulted to see if they can move this surgery date forward.  Eliquis is currently on hold.  Patient has history of chronic GI bleed/rectal bleed.  Rectal bleed became worse 2 days prior to presentation.  Other medical history includes diastolic congestive heart failure, atrial fibrillation on Eliquis and chronic kidney disease stage IIIa.   ? ?11/03/2021: Patient seen alongside patient's wife.  Patient remains relatively stable.  No shortness of breath or chest pain.  No other constitutional symptoms reported.  Last hemoglobin checked was 10.9. ? ? ?Assessment & Plan: ?  ?Principal Problem: ?  BRBPR (bright red blood per rectum) ?Active Problems: ?  Mass of cecum ?  Chronic anticoagulation ?  Essential hypertension ?  Chronic diastolic CHF (congestive heart failure) ([place]) ?  Persistent atrial fibrillation ([place]) ?  Chronic kidney disease, stage 3a ([place]) ?  Sleep apnea-on C-Pap ?  Elevated troponin level not due myocardial infarction ?  History of gastrointestinal tract bypass ? ? ?BRBPR (bright red blood per rectum) ?-BRBPR likely due to bleeding polyps / cecal mass. ?-Eliquis is on hold. ?-Continue to monitor H/H. ?-Surgical team has been consulted.  Input is highly appreciated. ?-I discussed with GI team, Dr. Wilford Corner.  After extensive discussion, it was deduced that patient will mainly need surgical input for hemicolectomy.  GI input is appreciated. ?-No significant bleeding noted since this morning.   ? ?Mass of cecum ?Currently scheduled for hemicolectomy in May. ?Surgical team is trying to see if they can bring the date for work. ? ?Chronic kidney disease,  stage 3a ([place]) ?Chronic, stable, baseline. ?  ?Persistent atrial fibrillation ([place]) ?Cont rate control with metoprolol. ?Eliquis is on hold.   ?  ?Chronic diastolic CHF (congestive heart failure) ([place]) ?Hold diuretics in setting of GIB. ?Cont jardiance ?Cont lopressor ?Compensated. ?  ?Essential hypertension ?Holding diuretics in setting of GIB. ?Blood pressures controlled. ?  ?Chronic anticoagulation ?Hold eliquis in setting of GIB ?  ?Elevated troponin level not due myocardial infarction ?H/o same, chronic trop elevation it looks like. ?  ?Sleep apnea-on C-Pap ?Cont CPAP ? ? ?DVT prophylaxis: SCD ?Code Status: Full code. ?Family Communication: Wife ?Disposition Plan: Home eventually ? ? ?Consultants:  ?Surgery. ?GI ? ?Procedures:  ?None ? ?Antimicrobials:  ?None ? ? ?Subjective: ?No further GI bleed reported. ?No chest pain. ?No shortness of breath. ? ?Objective: ?Vitals:  ? 11/03/21 0141 11/03/21 0533 11/03/21 0755 11/03/21 1640  ?BP: (!) 115/48 (!) 101/41 (!) 102/50 100/61  ?Pulse: 71 72 74 72  ?Resp: '17 16 18 18  '$ ?Temp: 98.2 ?F (36.8 ?C) 97.8 ?F (36.6 ?C) 97.7 ?F (36.5 ?C) 98 ?F (36.7 ?C)  ?TempSrc: Oral Oral Oral Oral  ?SpO2: 95% 95% 96% 95%  ? ? ?Intake/Output Summary (Last 24 hours) at 11/03/2021 1648 ?Last data filed at 11/03/2021 0930 ?Gross per 24 hour  ?Intake 600 ml  ?Output 400 ml  ?Net 200 ml  ? ?There were no vitals filed for this visit. ? ?Examination: ? ?General exam: Appears calm and comfortable. ?Respiratory system: Clear to auscultation.  ?Cardiovascular system: S1 & S2  ?Gastrointestinal system: Abdomen is obese, soft and nontender.   ?Central nervous system: Alert  and oriented.  Patient moves all extremities.   ? ?Data Reviewed: I have personally reviewed following labs and imaging studies ? ?CBC: ?Recent Labs  ?Lab 11/02/21 ?1607 11/02/21 ?2339 11/03/21 ?4431 11/03/21 ?1151  ?WBC 7.6  --   --   --   ?NEUTROABS 4.5  --   --   --   ?HGB 12.4* 11.7* 11.5* 10.9*  ?HCT 40.2 38.0* 36.8* 35.8*  ?MCV  92.0  --   --   --   ?PLT 158  --   --   --   ? ?Basic Metabolic Panel: ?Recent Labs  ?Lab 11/02/21 ?1607 11/03/21 ?5400  ?NA 137 137  ?K 4.4 4.1  ?CL 96* 95*  ?CO2 34* 35*  ?GLUCOSE 111* 103*  ?BUN 37* 35*  ?CREATININE 1.34* 1.41*  ?CALCIUM 9.0 8.9  ? ?GFR: ?CrCl cannot be calculated (Unknown ideal weight.). ?Liver Function Tests: ?Recent Labs  ?Lab 11/02/21 ?1607  ?AST 15  ?ALT 11  ?ALKPHOS 80  ?BILITOT 0.8  ?PROT 5.1*  ?ALBUMIN 2.9*  ? ?Recent Labs  ?Lab 11/02/21 ?1607  ?LIPASE 46  ? ?No results for input(s): AMMONIA in the last 168 hours. ?Coagulation Profile: ?Recent Labs  ?Lab 11/02/21 ?1607  ?INR 1.3*  ? ?Cardiac Enzymes: ?No results for input(s): CKTOTAL, CKMB, CKMBINDEX, TROPONINI in the last 168 hours. ?BNP (last 3 results) ?No results for input(s): PROBNP in the last 8760 hours. ?HbA1C: ?No results for input(s): HGBA1C in the last 72 hours. ?CBG: ?No results for input(s): GLUCAP in the last 168 hours. ?Lipid Profile: ?No results for input(s): CHOL, HDL, LDLCALC, TRIG, CHOLHDL, LDLDIRECT in the last 72 hours. ?Thyroid Function Tests: ?No results for input(s): TSH, T4TOTAL, FREET4, T3FREE, THYROIDAB in the last 72 hours. ?Anemia Panel: ?Recent Labs  ?  11/03/21 ?0642 11/03/21 ?8676  ?[id number]  --  475  ?FOLATE  --  16.0  ?FERRITIN  --  16*  ?TIBC  --  307  ?IRON  --  22*  ?RETICCTPCT 1.9  --   ? ?Urine analysis: ?   ?Component Value Date/Time  ? [place] YELLOW 08/30/2020 1820  ? APPEARANCEUR CLEAR 08/30/2020 1820  ? LABSPEC 1.025 08/30/2020 1820  ? PHURINE 5.5 08/30/2020 1820  ? GLUCOSEU NEGATIVE 08/30/2020 1820  ? HGBUR MODERATE (A) 08/30/2020 1820  ? [place] NEGATIVE 08/30/2020 1820  ? [place] NEGATIVE 08/30/2020 1820  ? PROTEINUR 30 (A) 08/30/2020 1820  ? UROBILINOGEN 0.2 12/05/2014 1052  ? NITRITE POSITIVE (A) 08/30/2020 1820  ? LEUKOCYTESUR MODERATE (A) 08/30/2020 1820  ? ?Sepsis Labs: ?'@LABRCNTIP'$ (procalcitonin:4,lacticidven:4) ? ?) ?Recent Results (from the past 240 hour(s))  ?Resp Panel by  RT-PCR (Flu A&B, Covid) Nasopharyngeal Swab     Status: None  ? Collection Time: 11/02/21  4:07 PM  ? Specimen: Nasopharyngeal Swab; Nasopharyngeal(NP) swabs in vial transport medium  ?Result Value Ref Range Status  ? SARS Coronavirus 2 by RT PCR NEGATIVE NEGATIVE Final  ?  Comment: (NOTE) ?SARS-CoV-2 target nucleic acids are NOT DETECTED. ? ?The SARS-CoV-2 RNA is generally detectable in upper respiratory ?specimens during the acute phase of infection. The lowest ?concentration of SARS-CoV-2 viral copies this assay can detect is ?138 copies/mL. A negative result does not preclude SARS-Cov-2 ?infection and should not be used as the sole basis for treatment or ?other patient management decisions. A negative result may occur with  ?improper specimen collection/handling, submission of specimen other ?than nasopharyngeal swab, presence of viral mutation(s) within the ?areas targeted by this assay, and inadequate number of viral ?copies(<138  copies/mL). A negative result must be combined with ?clinical observations, patient history, and epidemiological ?information. The expected result is Negative. ? ?Fact Sheet for Patients:  ?EntrepreneurPulse.com.au ? ?Fact Sheet for Healthcare Providers:  ?IncredibleEmployment.be ? ?This test is no t yet approved or cleared by the [place] FDA and  ?has been authorized for detection and/or diagnosis of SARS-CoV-2 by ?FDA under an Emergency Use Authorization (EUA). This EUA will remain  ?in effect (meaning this test can be used) for the duration of the ?COVID-19 declaration under Section 564(b)(1) of the Act, 21 ?U.S.C.section 360bbb-3(b)(1), unless the authorization is terminated  ?or revoked sooner.  ? ? ?  ? Influenza A by PCR NEGATIVE NEGATIVE Final  ? Influenza B by PCR NEGATIVE NEGATIVE Final  ?  Comment: (NOTE) ?The Xpert Xpress SARS-CoV-2/FLU/RSV plus assay is intended as an aid ?in the diagnosis of influenza from Nasopharyngeal swab  specimens and ?should not be used as a sole basis for treatment. Nasal washings and ?aspirates are unacceptable for Xpert Xpress SARS-CoV-2/FLU/RSV ?testing. ? ?Fact Sheet for Patients: ?http://www.[place].com/

## 2021-11-04 ENCOUNTER — Encounter (HOSPITAL_COMMUNITY): Payer: Self-pay | Admitting: Internal Medicine

## 2021-11-04 DIAGNOSIS — K922 Gastrointestinal hemorrhage, unspecified: Secondary | ICD-10-CM

## 2021-11-04 DIAGNOSIS — K625 Hemorrhage of anus and rectum: Secondary | ICD-10-CM | POA: Diagnosis not present

## 2021-11-04 LAB — HEMOGLOBIN AND HEMATOCRIT, BLOOD
HCT: 36 % — ABNORMAL LOW (ref 39.0–52.0)
HCT: 36.9 % — ABNORMAL LOW (ref 39.0–52.0)
HCT: 37.2 % — ABNORMAL LOW (ref 39.0–52.0)
Hemoglobin: 11 g/dL — ABNORMAL LOW (ref 13.0–17.0)
Hemoglobin: 11.3 g/dL — ABNORMAL LOW (ref 13.0–17.0)
Hemoglobin: 11.4 g/dL — ABNORMAL LOW (ref 13.0–17.0)

## 2021-11-04 NOTE — TOC Progression Note (Signed)
Transition of Care (TOC) - Progression Note  ? ? ?Patient Details  ?Name: Eric Mayer ?MRN: [id number] ?Date of Birth: 28-Oct-1940 ? ?Transition of Care (TOC) CM/SW Contact  ?Marilu Favre, RN ?Phone Number: ?11/04/2021, 11:14 AM ? ?Clinical Narrative:    ? ? ?Transition of Care (TOC) Screening Note ? ? ?Patient Details  ?Name: Eric Mayer ?Date of Birth: 1940/10/10 ? ? ? ?Transition of Care Department [facility]) has reviewed patient and no TOC needs have been identified at this time. We will continue to monitor patient advancement through interdisciplinary progression rounds. If new patient transition needs arise, please place a TOC consult. ?  ? ?  ?  ? ?Expected Discharge Plan and Services ?  ?  ?  ?  ?  ?                ?  ?  ?  ?  ?  ?  ?  ?  ?  ?  ? ? ?Social Determinants of Health (SDOH) Interventions ?  ? ?Readmission Risk Interventions ?   ? View : No data to display.  ?  ?  ?  ? ? ?

## 2021-11-04 NOTE — Progress Notes (Signed)
Patient refused cpap 

## 2021-11-04 NOTE — Progress Notes (Signed)
?PROGRESS NOTE ? ? ? KERMAN PFOST  SWN:[id number] DOB: Nov 14, 1940 DOA: 11/02/2021 ?PCP: [name], MD  ? ?Brief Narrative: ?This 81 year old Caucasian male with history of bleeding polyps /cecal mass, awaiting hemicolectomy by the surgical team.  Surgery is planned for Dec 17, 2021.  Surgical team has been consulted to see if they can move this surgery date forward.  Eliquis is currently on hold.  Patient has history of chronic GI bleed/ rectal bleed.  Rectal bleed became worse 2 days prior to presentation.  Other medical history includes diastolic congestive heart failure, atrial fibrillation on Eliquis and chronic kidney disease stage IIIa.   ?Patient is admitted for bright red blood per rectum,  General surgery and GI is consulted. ?H&H remained stable. ?  ?Assessment & Plan: ?  ?Principal Problem: ?  BRBPR (bright red blood per rectum) ?Active Problems: ?  Mass of cecum ?  Chronic anticoagulation ?  Essential hypertension ?  Chronic diastolic CHF (congestive heart failure) ([place]) ?  Persistent atrial fibrillation ([place]) ?  Chronic kidney disease, stage 3a ([place]) ?  Sleep apnea-on C-Pap ?  Elevated troponin level not due myocardial infarction ?  Gallstones ?  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  Chronic atrial fibrillation ([facility]) ?  Polyp of colon ?  Anemia due to chronic blood loss ?  Acute on chronic lower GI bleeding ? ?Bright red blood per rectum: ?Likely due to bleeding polyps /cecal mass. ?Patient was scheduled outpatient for hemicolectomy on May 4. ?Eliquis is on hold. ?H&H remains stable. ?General surgical team consulted. bleeding is due to being fully anticoagulated and should stabilize if we discontinue eliquis. ?GI is consulted Dr. Michail Sermon, after extensive discussion, it was decided that patient will mainly need surgical input for hemicolectomy.  GI input is appreciated. ?No significant bleeding noted since this morning.   ? ?Cecal Mass ?Currently scheduled for hemicolectomy in May. ?Surgical  team is trying to see if they can bring the date to April. ?  ?Chronic kidney disease, stage 3a ([place]) ?Serum creatinine at baseline. ?Avoid nephrotoxic medications ?  ?Persistent atrial fibrillation ([place]) ?Heart rate well controlled.  Continue metoprolol ?Eliquis is on hold.   ?  ?Chronic diastolic CHF (congestive heart failure) ([place]) ?He appears euvolemic. ?Hold diuretics in the setting of GI bleeding. ?Cont Jardiance, lopressor ?  ?Essential hypertension ?Blood pressures controlled. ?  ?Chronic anticoagulation ?Hold eliquis in setting of GIB. ?  ?Elevated troponins. ?Likely demand ischemia in the setting of GI bleed. ?H/o same, chronic trop elevation it looks like. ?  ?Sleep apnea: ?Cont CPAP ? ? ? ?DVT prophylaxis: SCDs ?Code Status: Full code ?Family Communication: No family at bedside ?Disposition Plan:  ? ?Status is: Observation ?The patient remains OBS appropriate and will d/c before 2 midnights. ?  ?Admitted for bright red blood per rectum, GI and general surgery is consulted. ? ?Consultants:  ?General surgery ?GI ? ?Procedures: None ?Antimicrobials: None ? ?Subjective: ?Patient was seen and examined at bedside.  Overnight events noted. ?Patient reports bleeding has stopped since morning.  He denies any abdominal pain. ? ?Objective: ?Vitals:  ? 11/03/21 1936 11/04/21 0139 11/04/21 0919 11/04/21 1228  ?BP: 115/60 (!) 104/46 (!) 85/44 (!) 113/59  ?Pulse: 72 72 69 75  ?Resp: '18 17 18 17  '$ ?Temp: 97.6 ?F (36.4 ?C) 97.9 ?F (36.6 ?C) 98.4 ?F (36.9 ?C)   ?TempSrc: Oral  Oral   ?SpO2: 94% 94% 94% 95%  ? ? ?Intake/Output Summary (Last 24 hours) at 11/04/2021 1356 ?Last  data filed at 11/04/2021 0704 ?Gross per 24 hour  ?Intake 240 ml  ?Output 350 ml  ?Net -110 ml  ? ?There were no vitals filed for this visit. ? ?Examination: ? ?General exam: Appears comfortable, not in any acute distress.  Deconditioned ?Respiratory system: CTA bilaterally, no wheezing, no crackles, normal respiratory effort. ?Cardiovascular system: S1 &  S2 heard, no murmur, Irregular rhythm. ?Gastrointestinal system: Abdomen is soft, nontender, nondistended, BS +. ?Central nervous system: Alert and oriented x 3. No focal neurological deficits. ?Extremities: No edema, no cyanosis, no clubbing. ?Skin: No rashes, lesions or ulcers ?Psychiatry: Judgement and insight appear normal. Mood & affect appropriate.  ? ? ? ?Data Reviewed: I have personally reviewed following labs and imaging studies ? ?CBC: ?Recent Labs  ?Lab 11/02/21 ?1607 11/02/21 ?2339 11/03/21 ?3710 11/03/21 ?1151 11/03/21 ?1758 11/04/21 ?0101 11/04/21 ?0617  ?WBC 7.6  --   --   --   --   --   --   ?NEUTROABS 4.5  --   --   --   --   --   --   ?HGB 12.4*   < > 11.5* 10.9* 11.1* 11.0* 11.3*  ?HCT 40.2   < > 36.8* 35.8* 36.5* 36.0* 36.9*  ?MCV 92.0  --   --   --   --   --   --   ?PLT 158  --   --   --   --   --   --   ? < > = values in this interval not displayed.  ? ?Basic Metabolic Panel: ?Recent Labs  ?Lab 11/02/21 ?1607 11/03/21 ?6269  ?NA 137 137  ?K 4.4 4.1  ?CL 96* 95*  ?CO2 34* 35*  ?GLUCOSE 111* 103*  ?BUN 37* 35*  ?CREATININE 1.34* 1.41*  ?CALCIUM 9.0 8.9  ? ?GFR: ?CrCl cannot be calculated (Unknown ideal weight.). ?Liver Function Tests: ?Recent Labs  ?Lab 11/02/21 ?1607  ?AST 15  ?ALT 11  ?ALKPHOS 80  ?BILITOT 0.8  ?PROT 5.1*  ?ALBUMIN 2.9*  ? ?Recent Labs  ?Lab 11/02/21 ?1607  ?LIPASE 46  ? ?No results for input(s): AMMONIA in the last 168 hours. ?Coagulation Profile: ?Recent Labs  ?Lab 11/02/21 ?1607  ?INR 1.3*  ? ?Cardiac Enzymes: ?No results for input(s): CKTOTAL, CKMB, CKMBINDEX, TROPONINI in the last 168 hours. ?BNP (last 3 results) ?No results for input(s): PROBNP in the last 8760 hours. ?HbA1C: ?No results for input(s): HGBA1C in the last 72 hours. ?CBG: ?No results for input(s): GLUCAP in the last 168 hours. ?Lipid Profile: ?No results for input(s): CHOL, HDL, LDLCALC, TRIG, CHOLHDL, LDLDIRECT in the last 72 hours. ?Thyroid Function Tests: ?No results for input(s): TSH, T4TOTAL, FREET4,  T3FREE, THYROIDAB in the last 72 hours. ?Anemia Panel: ?Recent Labs  ?  11/03/21 ?0642 11/03/21 ?4854  ?[id number]  --  475  ?FOLATE  --  16.0  ?FERRITIN  --  16*  ?TIBC  --  307  ?IRON  --  22*  ?RETICCTPCT 1.9  --   ? ?Sepsis Labs: ?No results for input(s): PROCALCITON, LATICACIDVEN in the last 168 hours. ? ?Recent Results (from the past 240 hour(s))  ?Resp Panel by RT-PCR (Flu A&B, Covid) Nasopharyngeal Swab     Status: None  ? Collection Time: 11/02/21  4:07 PM  ? Specimen: Nasopharyngeal Swab; Nasopharyngeal(NP) swabs in vial transport medium  ?Result Value Ref Range Status  ? SARS Coronavirus 2 by RT PCR NEGATIVE NEGATIVE Final  ?  Comment: (NOTE) ?SARS-CoV-2 target nucleic acids  are NOT DETECTED. ? ?The SARS-CoV-2 RNA is generally detectable in upper respiratory ?specimens during the acute phase of infection. The lowest ?concentration of SARS-CoV-2 viral copies this assay can detect is ?138 copies/mL. A negative result does not preclude SARS-Cov-2 ?infection and should not be used as the sole basis for treatment or ?other patient management decisions. A negative result may occur with  ?improper specimen collection/handling, submission of specimen other ?than nasopharyngeal swab, presence of viral mutation(s) within the ?areas targeted by this assay, and inadequate number of viral ?copies(<138 copies/mL). A negative result must be combined with ?clinical observations, patient history, and epidemiological ?information. The expected result is Negative. ? ?Fact Sheet for Patients:  ?EntrepreneurPulse.com.au ? ?Fact Sheet for Healthcare Providers:  ?IncredibleEmployment.be ? ?This test is no t yet approved or cleared by the [place] FDA and  ?has been authorized for detection and/or diagnosis of SARS-CoV-2 by ?FDA under an Emergency Use Authorization (EUA). This EUA will remain  ?in effect (meaning this test can be used) for the duration of the ?COVID-19 declaration under  Section 564(b)(1) of the Act, 21 ?U.S.C.section 360bbb-3(b)(1), unless the authorization is terminated  ?or revoked sooner.  ? ? ?  ? Influenza A by PCR NEGATIVE NEGATIVE Final  ? Influenza B by PCR NEGATI

## 2021-11-04 NOTE — Plan of Care (Signed)

## 2021-11-04 NOTE — Progress Notes (Addendum)
Mobility Specialist Progress Note: ? ? 11/04/21 1012  ?Mobility  ?Activity Ambulated with assistance in hallway  ?Level of Assistance Contact guard assist, steadying assist  ?Assistive Device Front wheel walker  ?Distance Ambulated (ft) 60 ft  ?Activity Response Tolerated well  ?$Mobility charge 1 Mobility  ? ?Pt received in bed willing to participate in mobility. No complaints of pain. When arriving in room pt informed me of skin tear on arm, RN notified. Left in chair with call bell in reach and all needs met.  ? ?Eric Mayer ?Mobility Specialist ?Primary Phone [phone] ? ?

## 2021-11-05 DIAGNOSIS — Z01810 Encounter for preprocedural cardiovascular examination: Secondary | ICD-10-CM

## 2021-11-05 DIAGNOSIS — I4819 Other persistent atrial fibrillation: Secondary | ICD-10-CM | POA: Diagnosis not present

## 2021-11-05 DIAGNOSIS — K922 Gastrointestinal hemorrhage, unspecified: Secondary | ICD-10-CM | POA: Diagnosis not present

## 2021-11-05 DIAGNOSIS — I5032 Chronic diastolic (congestive) heart failure: Secondary | ICD-10-CM | POA: Diagnosis not present

## 2021-11-05 DIAGNOSIS — K625 Hemorrhage of anus and rectum: Secondary | ICD-10-CM | POA: Diagnosis not present

## 2021-11-05 LAB — PREALBUMIN: Prealbumin: 12.4 mg/dL — ABNORMAL LOW (ref 18–38)

## 2021-11-05 LAB — CBC
HCT: 38.3 % — ABNORMAL LOW (ref 39.0–52.0)
Hemoglobin: 11.6 g/dL — ABNORMAL LOW (ref 13.0–17.0)
MCH: 27.4 pg (ref 26.0–34.0)
MCHC: 30.3 g/dL (ref 30.0–36.0)
MCV: 90.5 fL (ref 80.0–100.0)
Platelets: 151 10*3/uL (ref 150–400)
RBC: 4.23 MIL/uL (ref 4.22–5.81)
RDW: 14.8 % (ref 11.5–15.5)
WBC: 6.7 10*3/uL (ref 4.0–10.5)
nRBC: 0 % (ref 0.0–0.2)

## 2021-11-05 LAB — BASIC METABOLIC PANEL
Anion gap: 6 (ref 5–15)
BUN: 23 mg/dL (ref 8–23)
CO2: 32 mmol/L (ref 22–32)
Calcium: 9.1 mg/dL (ref 8.9–10.3)
Chloride: 99 mmol/L (ref 98–111)
Creatinine, Ser: 1.23 mg/dL (ref 0.61–1.24)
GFR, Estimated: >60 mL/min (ref >60)
Glucose, Bld: 101 mg/dL — ABNORMAL HIGH (ref 70–99)
Potassium: 4.1 mmol/L (ref 3.5–5.1)
Sodium: 137 mmol/L (ref 135–145)

## 2021-11-05 LAB — HEMOGLOBIN AND HEMATOCRIT, BLOOD
HCT: 38.9 % — ABNORMAL LOW (ref 39.0–52.0)
Hemoglobin: 12.1 g/dL — ABNORMAL LOW (ref 13.0–17.0)

## 2021-11-05 LAB — PHOSPHORUS: Phosphorus: 3.4 mg/dL (ref 2.5–4.6)

## 2021-11-05 LAB — MAGNESIUM: Magnesium: 2.5 mg/dL — ABNORMAL HIGH (ref 1.7–2.4)

## 2021-11-05 MED ORDER — TAB-A-VITE/IRON PO TABS
1.0000 | ORAL_TABLET | Freq: Every day | ORAL | Status: DC
Start: 2021-11-05 — End: 2021-11-06
  Administered 2021-11-06: 1 via ORAL
  Filled 2021-11-05 (×2): qty 1

## 2021-11-05 MED ORDER — POLYETHYLENE GLYCOL 3350 17 G PO PACK
34.0000 g | PACK | Freq: Two times a day (BID) | ORAL | Status: DC
Start: 2021-11-05 — End: 2021-11-06
  Administered 2021-11-05 – 2021-11-06 (×3): 34 g via ORAL
  Filled 2021-11-05 (×4): qty 2

## 2021-11-05 NOTE — Discharge Instructions (Addendum)
SURGERY: POST OP INSTRUCTIONS ?(Surgery for small bowel obstruction, colon resection, etc) ? ? ?###################################################################### ? ?EAT ?Gradually transition to a high fiber diet with a fiber supplement over the next few days after discharge ? ?WALK ?Walk an hour a day.  Control your pain to do that.   ? ?CONTROL PAIN ?Control pain so that you can walk, sleep, tolerate sneezing/coughing, go up/down stairs. ? ?HAVE A BOWEL MOVEMENT DAILY ?Keep your bowels regular to avoid problems.  OK to try a laxative to override constipation.  OK to use an antidairrheal to slow down diarrhea.  Call if not better after 2 tries ? ?CALL IF YOU HAVE PROBLEMS/CONCERNS ?Call if you are still struggling despite following these instructions. ?Call if you have concerns not answered by these instructions ? ?###################################################################### ? ? ?DIET ?Follow a light diet the first few days at home.  Start with a bland diet such as soups, liquids, starchy foods, low fat foods, etc.  If you feel full, bloated, or constipated, stay on a ful liquid or pureed/blenderized diet for a few days until you feel better and no longer constipated. ?Be sure to drink plenty of fluids every day to avoid getting dehydrated (feeling dizzy, not urinating, etc.). ?Gradually add a fiber supplement to your diet over the next week.  Gradually get back to a regular solid diet.  Avoid fast food or heavy meals the first week as you are more likely to get nauseated. ?It is expected for your digestive tract to need a few months to get back to normal.  It is common for your bowel movements and stools to be irregular.  You will have occasional bloating and cramping that should eventually fade away.  Until you are eating solid food normally, off all pain medications, and back to regular activities; your bowels will not be normal. ?Focus on eating a low-fat, high fiber diet the rest of your life  (See Getting to [place], below). ? ?CARE of your INCISION or WOUND ?It is good for closed incision and even open wounds to be washed every day.  Shower every day.  Short baths are fine.  Wash the incisions and wounds clean with soap & water.    ? ?If you have a closed incision(s), wash the incision with soap & water every day.  You may leave closed incisions open to air if it is dry.   You may cover the incision with clean gauze & replace it after your daily shower for comfort. ? ?It is good for closed incisions and even open wounds to be washed every day.  Shower every day.  Short baths are fine.  Wash the incisions and wounds clean with soap & water.    ?You may leave closed incisions open to air if it is dry.   You may cover the incision with clean gauze & replace it after your daily shower for comfort. ? ?TEGADERM:  You have clear gauze band-aid dressings over your closed incision(s).  Remove the dressings 3 days after surgery. ? ? ?If you have an open wound with a wound vac, see wound vac care instructions. ? ? ? ? ?ACTIVITIES as tolerated ?Start light daily activities --- self-care, walking, climbing stairs-- beginning the day after surgery.  Gradually increase activities as tolerated.  Control your pain to be active.  Stop when you are tired.  Ideally, walk several times a day, eventually an hour a day.   ?Most people are back to most day-to-day activities in  a few weeks.  It takes 4-8 weeks to get back to unrestricted, intense activity. ?If you can walk 30 minutes without difficulty, it is safe to try more intense activity such as jogging, treadmill, bicycling, low-impact aerobics, swimming, etc. ?Save the most intensive and strenuous activity for last (Usually 4-8 weeks after surgery) such as sit-ups, heavy lifting, contact sports, etc.  Refrain from any intense heavy lifting or straining until you are off narcotics for pain control.  You will have off days, but things should improve  week-by-week. ?DO NOT PUSH THROUGH PAIN.  Let pain be your guide: If it hurts to do something, don't do it.  Pain is your body warning you to avoid that activity for another week until the pain goes down. ?You may drive when you are no longer taking narcotic prescription pain medication, you can comfortably wear a seatbelt, and you can safely make sudden turns/stops to protect yourself without hesitating due to pain. ?You may have sexual intercourse when it is comfortable. If it hurts to do something, stop. ? ?MEDICATIONS ?Take your usually prescribed home medications unless otherwise directed.   ?Blood thinners:  ?Usually you can restart any strong blood thinners after the second postoperative day.  It is OK to take aspirin right away.    ? If you are on strong blood thinners (warfarin/Coumadin, Plavix, Xerelto, Eliquis, Pradaxa, etc), discuss with your surgeon, medicine PCP, and/or cardiologist for instructions on when to restart the blood thinner & if blood monitoring is needed (PT/INR blood check, etc).   ? ? ?PAIN CONTROL ?Pain after surgery or related to activity is often due to strain/injury to muscle, tendon, nerves and/or incisions.  This pain is usually short-term and will improve in a few months.  ?To help speed the process of healing and to get back to regular activity more quickly, DO THE FOLLOWING THINGS TOGETHER: ?Increase activity gradually.  DO NOT PUSH THROUGH PAIN ?Use Ice and/or Heat ?Try Gentle Massage and/or Stretching ?Take over the counter pain medication ?Take Narcotic prescription pain medication for more severe pain ? ?Good pain control = faster recovery.  It is better to take more medicine to be more active than to stay in bed all day to avoid medications. ? Increase activity gradually ?Avoid heavy lifting at first, then increase to lifting as tolerated over the next 6 weeks. ?Do not ?push through? the pain.  Listen to your body and avoid positions and maneuvers than reproduce the pain.   Wait a few days before trying something more intense ?Walking an hour a day is encouraged to help your body recover faster and more safely.  Start slowly and stop when getting sore.  If you can walk 30 minutes without stopping or pain, you can try more intense activity (running, jogging, aerobics, cycling, swimming, treadmill, sex, sports, weightlifting, etc.) ?Remember: If it hurts to do it, then don?t do it! ?Use Ice and/or Heat ?You will have swelling and bruising around the incisions.  This will take several weeks to resolve. ?Ice packs or heating pads (6-8 times a day, 30-60 minutes at a time) will help sooth soreness & bruising. ?Some people prefer to use ice alone, heat alone, or alternate between ice & heat.  Experiment and see what works best for you.  Consider trying ice for the first few days to help decrease swelling and bruising; then, switch to heat to help relax sore spots and speed recovery. ?Shower every day.  Short baths are fine.  It feels good!  Keep the incisions and wounds clean with soap & water.   ?Try Gentle Massage and/or Stretching ?Massage at the area of pain many times a day ?Stop if you feel pain - do not overdo it ?Take over the counter pain medication ?This helps the muscle and nerve tissues become less irritable and calm down faster ?Choose ONE of the following over-the-counter anti-inflammatory medications: ?Acetaminophen '500mg'$  tabs (Tylenol) 1-2 pills with every meal and just before bedtime (avoid if you have liver problems or if you have acetaminophen in you narcotic prescription) ?Naproxen '220mg'$  tabs (ex. Aleve, Naprosyn) 1-2 pills twice a day (avoid if you have kidney, stomach, IBD, or bleeding problems) ?Ibuprofen '200mg'$  tabs (ex. Advil, Motrin) 3-4 pills with every meal and just before bedtime (avoid if you have kidney, stomach, IBD, or bleeding problems) ?Take with food/snack several times a day as directed for at least 2 weeks to help keep pain / soreness down & more  manageable. ?Take Narcotic prescription pain medication for more severe pain ?A prescription for strong pain control is often given to you upon discharge (for example: oxycodone/Percocet, hydrocodone/Norco/Vicodin, or tramad

## 2021-11-05 NOTE — Progress Notes (Signed)
Pt awake and alert. Pt on RA. PT refusing Cpap. No resp distress noted.  ?

## 2021-11-05 NOTE — Consult Note (Signed)
?Cardiology Consultation:  ? ?Patient ID: Eric Mayer ?MRN: [id number]; DOB: [birth year] ? ?Admit date: 11/02/2021 ?Date of Consult: 11/05/2021 ? ?PCP:  [name], MD ?  ?[facility] Providers ?Cardiologist:  [name], MD  ?Advanced Heart Failure:  Glori Bickers, MD     ? ? ?Patient Profile:  ? ?Eric Mayer is a 81 y.o. male who is being seen 11/05/2021 for the evaluation of Eliquis usage in the setting of GI bleed at the request of Dr. Dwyane Dee. ? ?History of Present Illness:  ? 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 Lovena Le with pacemaker dual-chamber insertion, hypertension, obstructive sleep apnea on CPAP, prostate cancer, diastolic heart failure with chronic dyspnea in part secondary to restrictive lung disease with history of gastric bypass in the 1980s. ? ?He was here awaiting hemicolectomy by surgical team.  Surgery has been planned for May 4.  They were consulted see if they can move the date earlier.  He had chronic rectal GI bleed.  General surgery has seen him and is scheduled him for hemicolectomy next Tuesday at [facility].  We were consulted to determine need for anticoagulation while waiting for hemicolectomy next week.  Once again he was admitted with Mayer red blood per rectum.  This was likely secondary to bleeding polyp/cecal mass.  His Eliquis is on hold.  H&H remains fairly stable.  He is concerned about potential stroke risks being off of anticoagulation.  We discussed at length.  Currently no chest pain no shortness of breath no significant edema.  Quite jovial. ? ? ?Cath in 2004 showed normal coronary arteries.  Myoview in 2019 showed EF of 60% with no ischemia. ? ?Echo in 2021 showed EF 65% with severe LVH septum was 2.5 cm posterior wall was 2.1 cm.  PYP scan looking for amyloidosis in October 2021 was negative. ? ?[place] 09/04/20 ?  ?RA  = 3 ?RV = 38/5 ?PA = 41/8 (24) ?PCW = 15 (v = 26) ?Fick cardiac output/index = 7.6/3.1 ?Thermo CO/CI = 8.6/3.6 ?PVR = 1.0 WU ?FA sat = 97% ?PA sat = 61%, 62% ?SVC sat = 61% ?  ?He has seen Dr. Haroldine Laws last in October 2022 in the advanced heart failure clinic.  He had severe iron deficiency anemia receiving IV iron in the past.  Weight loss.  Chronic dyspnea with chronic diastolic heart failure.  Had been on Bumex 2 mg a day Jardiance 10 mg a day and metoprolol 25 twice daily.  Delene Loll was stopped previously because of low BP. ? ? ? ?Past Medical History:  ?Diagnosis Date  ? Anemia 1980s X 1  ? Arthritis   ? "hands, knees, hips, ankles" (07/31/2015)  ? Atrial flutter ([place]) 07/31/2015  ? CHF (congestive heart failure) ([place])   ? Chronic diastolic heart failure ([place])   ? a. 11/2015: Echo w/ EF of 60-65%, no WMA, Grade 2 DD, trivial AR, ascending aorta mildly dilated.   ? Depression   ? History of cardiovascular stress test   ? a. 03/2015: Dobutamine Stress Echo with no evidence of ischemia.   ? History of gastric bypass   ? History of peptic ulcer   ? 1980's  ? History of radiation therapy 1993  ? sarcoma of left groin, tx at [facility]  ? History of sarcoma   ? 1993  LEFT GOIN--  S/P SURGERY, RADIATION AND CHEMO [place]  ? Hypertension   ? Hypogonadism in male   ? Incomplete right bundle branch block   ? Nerve injury   ? 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 Nocturia   ? OSA on CPAP   ? Persistent atrial fibrillation ([place])   ?    ? 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 Primary prostate adenocarcinoma ([place]) DX 07/08/14  ? Gleason 7,  stage T1c  ? PVC's (premature ventricular contractions) 11/21/2015  ? 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 "of groin"  ? Weakness of left leg   ? SECONDARY TO SURGICAL NERVE INJURY OF LEFT GOIN  ? Wears glasses   ? ? ?Past Surgical History:  ?Procedure Laterality Date  ? ATRIAL FIBRILLATION ABLATION N/A  06/08/2018  ? Procedure: ATRIAL FIBRILLATION ABLATION;  Surgeon: Thompson Grayer, MD;  Location: [place] [facility];  Service: Cardiovascular;  Laterality: N/A;  ? ATRIAL FIBRILLATION ABLATION N/A 12/20/2018  ? Procedure: ATRIAL FIBRILLATION ABLATION;  Surgeon: Thompson Grayer, MD;  Location: [place] [facility];  Service: Cardiovascular;  Laterality: N/A;  ? AV NODE ABLATION  02/20/2020  ? AV NODE ABLATION N/A 02/20/2020  ? Procedure: AV NODE ABLATION;  Surgeon: Evans Lance, MD;  Location: [place] [facility];  Service: Cardiovascular;  Laterality: N/A;  ? AV NODE ABLATION N/A 12/29/2020  ? Procedure: AV NODE ABLATION;  Surgeon: Evans Lance, MD;  Location: [place] [facility];  Service: Cardiovascular;  Laterality: N/A;  ? BIOPSY  07/28/2021  ? Procedure: BIOPSY;  Surgeon: [name], MD;  Location: [facility] ENDOSCOPY;  Service: Endoscopy;;  ? CARDIAC CATHETERIZATION  08-12-2003  dr Tressia Miners turner  ? Normal coronary arteries, normal wall motion, no sig. abnormalities  ? CARDIOVERSION N/A 08/04/2015  ? Procedure: CARDIOVERSION;  Surgeon: [name], MD;  Location: [place];  Service: Cardiovascular;  Laterality: N/A;  ? CARDIOVERSION N/A 10/28/2017  ? Procedure: CARDIOVERSION;  Surgeon: Larey Dresser, MD;  Location: [facility] ENDOSCOPY;  Service: Cardiovascular;  Laterality: N/A;  ? CARDIOVERSION N/A 04/12/2018  ? Procedure: CARDIOVERSION;  Surgeon: Thayer Headings, MD;  Location: [place];  Service: Cardiovascular;  Laterality: N/A;  ? CARDIOVERSION N/A 07/26/2018  ? Procedure: CARDIOVERSION;  Surgeon: Larey Dresser, MD;  Location: [facility] ENDOSCOPY;  Service: Cardiovascular;  Laterality: N/A;  ? CARDIOVERSION N/A 10/09/2018  ? Procedure: CARDIOVERSION;  Surgeon: Sueanne Margarita, MD;  Location: [facility] ENDOSCOPY;  Service: Cardiovascular;  Laterality: N/A;  ? COLONOSCOPY  07-03-2002  ? COLONOSCOPY WITH PROPOFOL N/A 07/28/2021  ? Procedure: COLONOSCOPY WITH PROPOFOL;  Surgeon: [name], MD;  Location: [facility] ENDOSCOPY;   Service: Endoscopy;  Laterality: N/A;  ? COLONOSCOPY WITH PROPOFOL N/A 09/16/2021  ? Procedure: COLONOSCOPY WITH PROPOFOL;  Surgeon: [name], MD;  Location: [facility] ENDOSCOPY;  Service: Endoscopy;  Laterality: N/A;  ? [place]  ? FRACTURE SURGERY    ? [place]  ? SARCOMA SURGERY  ? INGUINAL HERNIA REPAIR Right 1950s?  ? INSERT / REPLACE / REMOVE PACEMAKER  02/20/2020  ? INTESTINAL BYPASS  1976  ? GASTRIC FOR OBESITY  ? JOINT REPLACEMENT    ? PACEMAKER IMPLANT N/A 02/20/2020  ? Procedure: PACEMAKER IMPLANT;  Surgeon: Evans Lance, MD;  Location: [place] [facility];  Service: Cardiovascular;  Laterality: N/A;  ? PATELLA FRACTURE SURGERY Left ~ 1995  ? "broke it twice; only had  OR once"  ? POLYPECTOMY  07/28/2021  ? Procedure: POLYPECTOMY;  Surgeon: [name], MD;  Location: [facility] ENDOSCOPY;  Service: Endoscopy;;  ? POLYPECTOMY  09/16/2021  ? Procedure: POLYPECTOMY;  Surgeon: [name], MD;  Location: [facility];  Service: Endoscopy;;  ? PROSTATE BIOPSY  06/2014  ? RADIOACTIVE SEED IMPLANT N/A 10/17/2014  ? Procedure: RADIOACTIVE SEED IMPLANT    ;  Surgeon: Ailene Rud, MD;  Location: [facility];  Service: Urology;  Laterality: N/A;   68 SEEDS IMPLANTED ?  ? RIGHT HEART CATH N/A 09/04/2020  ? Procedure: RIGHT HEART CATH;  Surgeon: Jolaine Artist, MD;  Location: [place] [facility];  Service: Cardiovascular;  Laterality: N/A;  ? TEE WITHOUT CARDIOVERSION N/A 10/28/2017  ? Procedure: TRANSESOPHAGEAL ECHOCARDIOGRAM (TEE);  Surgeon: Larey Dresser, MD;  Location: [facility] ENDOSCOPY;  Service: Cardiovascular;  Laterality: N/A;  ? TONSILLECTOMY  1950s  ? TOTAL KNEE ARTHROPLASTY Right 12/17/2014  ? Procedure: TOTAL KNEE ARTHROPLASTY;  Surgeon: Melrose Nakayama, MD;  Location: [place];  Service: Orthopedics;  Laterality: Right;  ? TRANSTHORACIC ECHOCARDIOGRAM  08-08-2003  ? moderate LVH/  ef 55-65%/  mild MR/  moderate LAE/  trivial TR/  trivial pericardial  effusion posterior to the heart  ? [place]  ? VIDEO BRONCHOSCOPY Bilateral 09/21/2016  ? Procedure: VIDEO BRONCHOSCOPY WITHOUT FLUORO;  Surgeon: Collene Gobble, MD;  Location: [place];  Ser

## 2021-11-05 NOTE — Progress Notes (Addendum)
Mobility Specialist Progress Note: ? ? 11/05/21 1515  ?Mobility  ?Activity Ambulated with assistance in hallway  ?Level of Assistance Standby assist, set-up cues, supervision of patient - no hands on  ?Assistive Device Front wheel walker  ?Distance Ambulated (ft) 100 ft  ?Activity Response Tolerated well  ?$Mobility charge 1 Mobility  ? ?Pt received  in bed willing to participate in mobility. No complaints of pain.Required 1 seated break. Pt left in bed with call bell in reach and all needs met.  ? ?Eric Mayer ?Mobility Specialist ?Primary Phone [phone] ? ?

## 2021-11-05 NOTE — Progress Notes (Signed)
?PROGRESS NOTE ? ? ? LEDGER HEINDL  HUT:[id number] DOB: [birth year] DOA: 11/02/2021 ?PCP: [name], MD  ? ?Brief Narrative: ?This 81 year old Caucasian male with history of bleeding polyps /cecal mass, awaiting hemicolectomy by the surgical team.  Surgery is planned for Dec 17, 2021.  Surgical team has been consulted to see if they can move this surgery date earlier.  Eliquis is currently on hold.  Patient has history of chronic GI bleed / rectal bleed.  Rectal bleed became worse 2 days prior to presentation.  Other medical history includes diastolic congestive heart failure, atrial fibrillation on Eliquis and chronic kidney disease stage IIIa.   ?Patient is admitted for bright red blood per rectum,  General surgery and GI is consulted.  General surgery has scheduled him for hemicolectomy next Tuesday at [facility].  Cardiology is consulted to determine need for anticoagulation while waiting for hemicolectomy next week. ?H&H remained stable. ?  ?Assessment & Plan: ?  ?Principal Problem: ?  BRBPR (bright red blood per rectum) ?Active Problems: ?  Mass of cecum ?  Chronic anticoagulation ?  Essential hypertension ?  Chronic diastolic CHF (congestive heart failure) ([place]) ?  Persistent atrial fibrillation ([place]) ?  Chronic kidney disease, stage 3a ([place]) ?  Sleep apnea-on C-Pap ?  Elevated troponin level not due myocardial infarction ?  Gallstones ?  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  Chronic atrial fibrillation ([facility]) ?  Polyp of colon ?  Anemia due to chronic blood loss ?  Acute on chronic lower GI bleeding ? ?Bright red blood per rectum: ?Likely due to bleeding polyps /cecal mass. ?Patient was scheduled outpatient for hemicolectomy on May 4. ?Eliquis is on hold. ?H&H remains stable. ?General surgical team consulted. bleeding is due to being fully anticoagulated and should stabilize if we discontinue eliquis. ?GI is consulted Dr. Michail Sermon, after extensive discussion, it was decided that patient will mainly  need surgical input for hemicolectomy.   ?No significant bleeding noted since this morning.   ?Cardiology is consulted, to determine need for anticoagulation while waiting for hemicolectomy next week. ? ?Cecal Mass ?He was scheduled for hemicolectomy in May. ?Surgical team were successful to schedule him for hemicolectomy next Tuesday at [facility]. ?  ?Chronic kidney disease, stage 3a ([place]) ?Serum creatinine at baseline. ?Avoid nephrotoxic medications ?  ?Persistent atrial fibrillation ([place]) ?Heart rate well controlled.  Continue metoprolol ?Eliquis is on hold.  Cardiology consulted to decide whether patient needs Lovenox while waiting for hemicolectomy. ?  ?Chronic diastolic CHF (congestive heart failure) ([place]) ?He appears euvolemic. ?Hold diuretics in the setting of GI bleeding. ?Cont Jardiance, lopressor ?  ?Essential hypertension ?Blood pressures controlled. ?  ?Chronic anticoagulation ?Hold eliquis in setting of GIB. ?  ?Elevated troponins. ?Likely demand ischemia in the setting of GI bleed. ?H/o same, chronic trop elevation it looks like. ?  ?Sleep apnea: ?Cont CPAP ? ? ? ?DVT prophylaxis: SCDs ?Code Status: Full code ?Family Communication: No family at bedside ?Disposition Plan:  ? ?Status is: Observation ?The patient remains OBS appropriate and will d/c before 2 midnights. ?  ?Admitted for bright red blood per rectum, GI and general surgery is consulted.  Cardiology consulted to decide whether patient needs Lovenox while waiting for hemicolectomy. ?  ?Anticipated discharge home 11/06/2021 ? ?Consultants:  ?General surgery ?GI ? ?Procedures: None ?Antimicrobials: None ? ?Subjective: ?Patient was seen and examined at bedside.  Overnight events noted. ?Patient reports he has not had a bowel movement yet today.  Denies abdominal pain. ? ?  Objective: ?Vitals:  ? 11/04/21 1544 11/04/21 2035 11/05/21 0436 11/05/21 0817  ?BP: (!) 103/48 (!) 107/59 (!) 103/43 (!) 116/55  ?Pulse: 70 70 68 70  ?Resp: '16 17 17 17   '$ ?Temp: 97.8 ?F (36.6 ?C) 97.7 ?F (36.5 ?C) 97.7 ?F (36.5 ?C) 98 ?F (36.7 ?C)  ?TempSrc: Oral Oral Oral Oral  ?SpO2: 94% 95% 96% 90%  ? ? ?Intake/Output Summary (Last 24 hours) at 11/05/2021 1358 ?Last data filed at 11/05/2021 0439 ?Gross per 24 hour  ?Intake --  ?Output 350 ml  ?Net -350 ml  ? ?There were no vitals filed for this visit. ? ?Examination: ? ?General exam: Appears comfortable, not in any acute distress.  Deconditioned ?Respiratory system: CTA bilaterally, no wheezing, no crackles, normal respiratory effort. ?Cardiovascular system: S1 & S2 heard, no murmur, Irregular rhythm. ?Gastrointestinal system: Abdomen is soft, non tender, non distended, BS +. ?Central nervous system: Alert and oriented x 3. No focal neurological deficits. ?Extremities: No edema, no cyanosis, no clubbing. ?Skin: No rashes, lesions or ulcers ?Psychiatry: Judgement and insight appear normal. Mood & affect appropriate.  ? ? ? ?Data Reviewed: I have personally reviewed following labs and imaging studies ? ?CBC: ?Recent Labs  ?Lab 11/02/21 ?1607 11/02/21 ?2339 11/03/21 ?1758 11/04/21 ?0101 11/04/21 ?0617 11/04/21 ?1834 11/05/21 ?7564  ?WBC 7.6  --   --   --   --   --  6.7  ?NEUTROABS 4.5  --   --   --   --   --   --   ?HGB 12.4*   < > 11.1* 11.0* 11.3* 11.4* 11.6*  ?HCT 40.2   < > 36.5* 36.0* 36.9* 37.2* 38.3*  ?MCV 92.0  --   --   --   --   --  90.5  ?PLT 158  --   --   --   --   --  151  ? < > = values in this interval not displayed.  ? ?Basic Metabolic Panel: ?Recent Labs  ?Lab 11/02/21 ?1607 11/03/21 ?3329 11/05/21 ?5188  ?NA 137 137 137  ?K 4.4 4.1 4.1  ?CL 96* 95* 99  ?CO2 34* 35* 32  ?GLUCOSE 111* 103* 101*  ?BUN 37* 35* 23  ?CREATININE 1.34* 1.41* 1.23  ?CALCIUM 9.0 8.9 9.1  ?MG  --   --  2.5*  ?PHOS  --   --  3.4  ? ?GFR: ?CrCl cannot be calculated (Unknown ideal weight.). ?Liver Function Tests: ?Recent Labs  ?Lab 11/02/21 ?1607  ?AST 15  ?ALT 11  ?ALKPHOS 80  ?BILITOT 0.8  ?PROT 5.1*  ?ALBUMIN 2.9*  ? ?Recent Labs  ?Lab  11/02/21 ?1607  ?LIPASE 46  ? ?No results for input(s): AMMONIA in the last 168 hours. ?Coagulation Profile: ?Recent Labs  ?Lab 11/02/21 ?1607  ?INR 1.3*  ? ?Cardiac Enzymes: ?No results for input(s): CKTOTAL, CKMB, CKMBINDEX, TROPONINI in the last 168 hours. ?BNP (last 3 results) ?No results for input(s): PROBNP in the last 8760 hours. ?HbA1C: ?No results for input(s): HGBA1C in the last 72 hours. ?CBG: ?No results for input(s): GLUCAP in the last 168 hours. ?Lipid Profile: ?No results for input(s): CHOL, HDL, LDLCALC, TRIG, CHOLHDL, LDLDIRECT in the last 72 hours. ?Thyroid Function Tests: ?No results for input(s): TSH, T4TOTAL, FREET4, T3FREE, THYROIDAB in the last 72 hours. ?Anemia Panel: ?Recent Labs  ?  11/03/21 ?0642 11/03/21 ?4166  ?[id number]  --  475  ?FOLATE  --  16.0  ?FERRITIN  --  16*  ?TIBC  --  307  ?IRON  --  22*  ?RETICCTPCT 1.9  --   ? ?Sepsis Labs: ?No results for input(s): PROCALCITON, LATICACIDVEN in the last 168 hours. ? ?Recent Results (from the past 240 hour(s))  ?Resp Panel by RT-PCR (Flu A&B, Covid) Nasopharyngeal Swab     Status: None  ? Collection Time: 11/02/21  4:07 PM  ? Specimen: Nasopharyngeal Swab; Nasopharyngeal(NP) swabs in vial transport medium  ?Result Value Ref Range Status  ? SARS Coronavirus 2 by RT PCR NEGATIVE NEGATIVE Final  ?  Comment: (NOTE) ?SARS-CoV-2 target nucleic acids are NOT DETECTED. ? ?The SARS-CoV-2 RNA is generally detectable in upper respiratory ?specimens during the acute phase of infection. The lowest ?concentration of SARS-CoV-2 viral copies this assay can detect is ?138 copies/mL. A negative result does not preclude SARS-Cov-2 ?infection and should not be used as the sole basis for treatment or ?other patient management decisions. A negative result may occur with  ?improper specimen collection/handling, submission of specimen other ?than nasopharyngeal swab, presence of viral mutation(s) within the ?areas targeted by this assay, and inadequate number of  viral ?copies(<138 copies/mL). A negative result must be combined with ?clinical observations, patient history, and epidemiological ?information. The expected result is Negative. ? ?Fact Sheet for Patients:  ?https:/

## 2021-11-05 NOTE — Progress Notes (Signed)
Note: Portions of this report may have been transcribed using voice recognition software. Every effort was made to ensure accuracy; however, inadvertent computerized transcription errors may be present.   Any transcriptional errors that result from this process are unintentional.              Eric Mayer  1940/09/15 [id number]  Patient Care Team: Kirby Funk, MD as PCP - General (Internal Medicine) Chilton Si, MD as PCP - Cardiology (Cardiology) [name], Bevelyn Buckles, MD as PCP - Advanced Heart Failure (Cardiology) Newman Nip, NP as Nurse Practitioner (Nurse Practitioner) Vida Rigger, MD as Consulting Physician (Gastroenterology) Karie Soda, MD as Consulting Physician (General Surgery) Chilton Si, MD as Consulting Physician (Cardiology)    Patient's hemoglobin stable.  Should have received IV gluconate for iron deficiency anemia.  Somewhat malnourished but prealbumin not horrific.  Definitely recommend supplemental shakes and vitamins to help get nutritional status improved.  We have been able to move his case up to next Tuesday afternoon at [facility], 3/28.  Robotic proximal colectomy with takedown and revision of his jejunocolonic bypass en block.    OK to be on solid diet & discharge from my standpoint.  Multivitamin with supplemental shakes.  Would get on MiraLAX twice a day to help gradually clean the colon out since he usually requires a double prep for his colonoscopies given his severe constipation and atypical anatomy.    Switch to clears and double bowel prep on Monday.  Perhaps keep off Eliquis until surgery happens.  Defer to the idea for Lovenox bridge or any more aggressive anticoagulation in the interim to medicine and cardiology, although I suspect his GI bleeding this instance was due to him being fully anticoagulated and has resolved with him being off that.    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   Recurrent major depression in full remission (HCC) 10/20/2021   Pernicious anemia 10/20/2021   Personal history of colonic polyps 10/20/2021   Iron deficiency anemia 10/20/2021   Anemia due to chronic blood loss 10/20/2021   Sepsis due to gram-negative urinary tract infection (HCC) 08/31/2020   Sepsis due to gram-negative UTI (HCC) 08/30/2020   History of malignant neoplasm of prostate 08/30/2020   Complicated UTI (urinary tract infection) 08/30/2020   Chronic kidney disease, stage 3a (HCC) 08/30/2020   Hyperkalemia 08/30/2020   Elevated troponin level not due myocardial infarction 08/30/2020   Lactic acidosis 08/30/2020   Complete heart block (HCC) 06/03/2020   Pacemaker 06/03/2020   Atrial fibrillation with rapid ventricular response (HCC) 02/20/2020   Persistent atrial fibrillation (HCC) 06/08/2018   Allergic rhinitis 10/15/2016   Intrinsic asthma 09/16/2016   Chronic diastolic CHF (congestive heart failure) (HCC) 07/21/2016   PVC's (premature ventricular contractions) 11/21/2015   Chronic anticoagulation 08/04/2015   Essential hypertension 08/04/2015   Obesity 08/04/2015   Sleep apnea-on C-Pap 08/04/2015   Acute on chronic diastolic heart failure (HCC) 07/31/2015   Primary  osteoarthritis of right knee 12/17/2014   Malignant neoplasm of prostate (HCC) 08/20/2014    Past Medical History:  Diagnosis Date   Anemia 1980s X 1   Arthritis    "hands, knees, hips,  ankles" (07/31/2015)   Atrial flutter (HCC) 07/31/2015   CHF (congestive heart failure) (HCC)    Chronic diastolic heart failure (HCC)    a. 11/2015: Echo w/ EF of 60-65%, no WMA, Grade 2 DD, trivial AR, ascending aorta mildly dilated.    Depression    History of cardiovascular stress test    a. 03/2015: Dobutamine Stress Echo with no evidence of ischemia.    History of gastric bypass    History of peptic ulcer    1980's   History of radiation therapy 1993   sarcoma of left groin, tx at [facility]   History of sarcoma    1993  LEFT GOIN--  S/P SURGERY, RADIATION AND CHEMO IN [place]   Hypertension    Hypogonadism in male    Incomplete right bundle branch block    Nerve injury    SURGICAL NERVE INJURY S/P  LEFT GOIN REMOVAL SARCOMA 1992--  RESIDUAL WEAKNESS AND NUMBNESS UPPER LEFT LEG   Nocturia    OSA on CPAP    Persistent atrial fibrillation (HCC)        Presence of permanent cardiac pacemaker 02/20/2020   Primary prostate adenocarcinoma (HCC) DX 07/08/14   Gleason 7,  stage T1c   PVC's (premature ventricular contractions) 11/21/2015   Sarcoma (HCC) ~ 1993/1994   "of groin"   Weakness of left leg    SECONDARY TO SURGICAL NERVE INJURY OF LEFT GOIN   Wears glasses     Past Surgical History:  Procedure Laterality Date   ATRIAL FIBRILLATION ABLATION N/A 06/08/2018   Procedure: ATRIAL FIBRILLATION ABLATION;  Surgeon: Hillis Range, MD;  [facility]: [facility];  Service: Cardiovascular;  Laterality: N/A;   ATRIAL FIBRILLATION ABLATION N/A 12/20/2018   Procedure: ATRIAL FIBRILLATION ABLATION;  Surgeon: Hillis Range, MD;  Location: [facility];  Service: Cardiovascular;  Laterality: N/A;   AV NODE ABLATION  02/20/2020   AV NODE ABLATION N/A 02/20/2020   Procedure: AV NODE ABLATION;  Surgeon: Marinus Maw, MD;  Location: [facility];  Service: Cardiovascular;  Laterality: N/A;   AV NODE ABLATION N/A 12/29/2020   Procedure: AV NODE ABLATION;  Surgeon: Marinus Maw, MD;  Location: [facility];  Service: Cardiovascular;  Laterality: N/A;   BIOPSY  07/28/2021   Procedure: BIOPSY;  Surgeon: Vida Rigger, MD;  Location: [facility];  Service: Endoscopy;;   CARDIAC CATHETERIZATION  08-12-2003  dr Gloris [place] turner   Normal coronary arteries, normal wall motion, no sig. abnormalities   CARDIOVERSION N/A 08/04/2015   Procedure: CARDIOVERSION;  Surgeon: Chilton Si, MD;  Location: [facility] ENDOSCOPY;  Service: Cardiovascular;  Laterality: N/A;   CARDIOVERSION N/A 10/28/2017   Procedure: CARDIOVERSION;  Surgeon: Laurey Morale, MD;  Location: [facility] ENDOSCOPY;  Service: Cardiovascular;  Laterality: N/A;   CARDIOVERSION N/A 04/12/2018   Procedure: CARDIOVERSION;  Surgeon: Vesta Mixer, MD;  Location: [facility] ENDOSCOPY;  Service: Cardiovascular;  Laterality: N/A;   CARDIOVERSION N/A 07/26/2018   Procedure: CARDIOVERSION;  Surgeon: Laurey Morale, MD;  Location: [facility] ENDOSCOPY;  Service: Cardiovascular;  Laterality: N/A;   CARDIOVERSION N/A 10/09/2018   Procedure: CARDIOVERSION;  Surgeon: Quintella Reichert, MD;  Location: [facility] ENDOSCOPY;  Service: Cardiovascular;  Laterality: N/A;   COLONOSCOPY  07-03-2002  COLONOSCOPY WITH PROPOFOL N/A 07/28/2021   Procedure: COLONOSCOPY WITH PROPOFOL;  Surgeon: Vida Rigger, MD;  Location: [facility];  Service: Endoscopy;  Laterality: N/A;   COLONOSCOPY WITH PROPOFOL N/A 09/16/2021   Procedure: COLONOSCOPY WITH PROPOFOL;  Surgeon: Vida Rigger, MD;  Location: [facility];  Service: Endoscopy;  Laterality: N/A;   FOREARM FRACTURE SURGERY  1986   FRACTURE SURGERY     GROIN DISSECTION Left 1992   [place]   SARCOMA SURGERY   INGUINAL HERNIA REPAIR Right 1950s?   INSERT / REPLACE / REMOVE PACEMAKER  02/20/2020   INTESTINAL BYPASS  1976   GASTRIC FOR OBESITY   JOINT REPLACEMENT     PACEMAKER IMPLANT N/A 02/20/2020   Procedure: PACEMAKER IMPLANT;  Surgeon: Marinus Maw, MD;  [facility]: [facility];  Service:  Cardiovascular;  Laterality: N/A;   PATELLA FRACTURE SURGERY Left ~ 1995   "broke it twice; only had OR once"   POLYPECTOMY  07/28/2021   Procedure: POLYPECTOMY;  Surgeon: Vida Rigger, MD;  Location: [facility];  Service: Endoscopy;;   POLYPECTOMY  09/16/2021   Procedure: POLYPECTOMY;  Surgeon: Vida Rigger, MD;  Location: [facility];  Service: Endoscopy;;   PROSTATE BIOPSY  06/2014   RADIOACTIVE SEED IMPLANT N/A 10/17/2014   Procedure: RADIOACTIVE SEED IMPLANT    ;  Surgeon: Kathi Ludwig, MD;  Location: [facility];  Service: Urology;  Laterality: N/A;   3 SEEDS IMPLANTED    RIGHT HEART CATH N/A 09/04/2020   Procedure: RIGHT HEART CATH;  Surgeon: Dolores Patty, MD;  [facility]: [facility];  Service: Cardiovascular;  Laterality: N/A;   TEE WITHOUT CARDIOVERSION N/A 10/28/2017   Procedure: TRANSESOPHAGEAL ECHOCARDIOGRAM (TEE);  Surgeon: Laurey Morale, MD;  Location: [facility] ENDOSCOPY;  Service: Cardiovascular;  Laterality: N/A;   TONSILLECTOMY  1950s   TOTAL KNEE ARTHROPLASTY Right 12/17/2014   Procedure: TOTAL KNEE ARTHROPLASTY;  Surgeon: Marcene Corning, MD;  Location: [facility];  Service: Orthopedics;  Laterality: Right;   TRANSTHORACIC ECHOCARDIOGRAM  08-08-2003   moderate LVH/  ef 55-65%/  mild MR/  moderate LAE/  trivial TR/  trivial pericardial effusion posterior to the heart   VENTRAL HERNIA REPAIR  1977   VIDEO BRONCHOSCOPY Bilateral 09/21/2016   Procedure: VIDEO BRONCHOSCOPY WITHOUT FLUORO;  Surgeon: Leslye Peer, MD;  Location: [facility] ENDOSCOPY;  Service: Cardiopulmonary;  Laterality: Bilateral;    Social History   Socioeconomic History   Marital status: Married    Spouse name: Not on file   Number of children: Not on file   Years of education: Not on file   Highest education level: Not on file  Occupational History   Not on file  Tobacco Use   Smoking status: Never   Smokeless tobacco: Never  Vaping Use   Vaping Use: Never used  Substance and  Sexual Activity   Alcohol use: No   Drug use: No   Sexual activity: Not Currently  Other Topics Concern   Not on file  Social History Narrative   Lives in [place]   Retired Runner, broadcasting/film/video from [employer]   [employer] manager and art   Social Determinants of Health   Financial Resource Strain: Not on file  Food Insecurity: Not on file  Transportation Needs: Not on file  Physical Activity: Not on file  Stress: Not on file  Social Connections: Not on file  Intimate Partner Violence: Not on file    Family History  Problem Relation Age of  Onset   Liver disease Mother    Renal Disease Mother    Stroke Mother    Atrial fibrillation Father    Cancer Sister     Current Facility-Administered Medications  Medication Dose Route Frequency Provider Last Rate Last Admin   acetaminophen (TYLENOL) tablet 650 mg  650 mg Oral Q6H PRN Hillary Bow, DO       Or   acetaminophen (TYLENOL) suppository 650 mg  650 mg Rectal Q6H PRN Hillary Bow, DO       empagliflozin (JARDIANCE) tablet 10 mg  10 mg Oral q morning Lyda Perone M, DO   10 mg at 11/05/21 1014   escitalopram (LEXAPRO) tablet 20 mg  20 mg Oral Daily Lyda Perone M, DO   20 mg at 11/05/21 1014   ferrous sulfate tablet 325 mg  325 mg Oral Rondel Jumbo, DO   325 mg at 11/05/21 1014   metoprolol tartrate (LOPRESSOR) tablet 50 mg  50 mg Oral BID Hillary Bow, DO   50 mg at 11/03/21 2104   ondansetron (ZOFRAN) tablet 4 mg  4 mg Oral Q6H PRN Hillary Bow, DO       Or   ondansetron [facility]) injection 4 mg  4 mg Intravenous Q6H PRN Hillary Bow, DO       pantoprazole (PROTONIX) EC tablet 40 mg  40 mg Oral q morning Lyda Perone M, DO   40 mg at 11/05/21 1014   polyvinyl alcohol (LIQUIFILM TEARS) 1.4 % ophthalmic solution 1 drop  1 drop Both Eyes PRN Hillary Bow, DO       tamsulosin [facility]) capsule 0.8 mg  0.8 mg Oral QHS Lyda Perone M, DO   0.8 mg at 11/04/21 2047     Allergies  Allergen  Reactions   Ace Inhibitors Cough   Xarelto [Rivaroxaban] Other (See Comments)    Joint pain    BP (!) 116/55 (BP Location: Left Arm)   Pulse 70   Temp 98 F (36.7 C) (Oral)   Resp 17   SpO2 90%   DG Chest Port 1 View  Result Date: 11/02/2021 CLINICAL DATA:  Shortness of breath.  GI bleed.  Dizziness. EXAM: PORTABLE CHEST 1 VIEW COMPARISON:  09/18/2018 FINDINGS: Left-sided pacemaker in place with leads in the right atrium and ventricle. Lung volumes are low. Cardiomegaly is similar. Unchanged mediastinal contours. There are small bilateral pleural effusions that are not significantly changed from prior exam. Trace fluid in the right minor fissure. Bibasilar atelectasis, right greater than left. No pulmonary edema. No focal opacity in the upper lung zones. No pneumothorax. No acute osseous abnormalities are seen. IMPRESSION: Low lung volumes with cardiomegaly and small bilateral pleural effusions. Overall findings are not significantly changed from prior exam allowing for differences in positioning. Electronically Signed   By: Narda Rutherford M.D.   On: 11/02/2021 16:15

## 2021-11-06 ENCOUNTER — Encounter: Payer: Self-pay | Admitting: Internal Medicine

## 2021-11-06 DIAGNOSIS — K625 Hemorrhage of anus and rectum: Secondary | ICD-10-CM | POA: Diagnosis not present

## 2021-11-06 LAB — HEMOGLOBIN AND HEMATOCRIT, BLOOD
HCT: 35.8 % — ABNORMAL LOW (ref 39.0–52.0)
Hemoglobin: 10.9 g/dL — ABNORMAL LOW (ref 13.0–17.0)

## 2021-11-06 MED ORDER — POLYETHYLENE GLYCOL 3350 17 G PO PACK
34.0000 g | PACK | Freq: Two times a day (BID) | ORAL | 0 refills | Status: AC
Start: 2021-11-06 — End: 2021-11-10

## 2021-11-06 NOTE — Progress Notes (Signed)
Discharge instructions given with wife present at bedside. Both pt and wife verbalize understanding, including instruction per hospitalist and surgery team to continue to hold Eliquis at home until further instructed. ?Pt remains stable at baseline; transport via [place] with all belongings on self. ?

## 2021-11-06 NOTE — H&P (View-Only) (Signed)
? Eric Mayer ?[id number] ?09/15/1940 ? ?CARE TEAM: ? ?PCP: [name], MD ? ?Outpatient Care Team: Patient Care Team: ?[name], MD as PCP - General (Internal Medicine) ?[name], MD as PCP - Cardiology (Cardiology) ?[name], [name], MD as PCP - Advanced Heart Failure (Cardiology) ?[name], NP as Nurse Practitioner (Nurse Practitioner) ?[name], MD as Consulting Physician (Gastroenterology) ?[name], MD as Consulting Physician (General Surgery) ?[name], MD as Consulting Physician (Cardiology) ? ?Inpatient Treatment Team: Treatment Team: Attending Provider: [name], MD; Consulting Physician: [name], MD; Rounding Team: Lbcardiology, [name], MD; Mobility Specialist: [name], [name]; Registered Nurse: [name], [name], RN; Technician: [name], NT; Rounding Team: [name], MD; Utilization Review: [place], [name], RN ? ? ?Problem List:  ? ?Principal Problem: ?  BRBPR (bright red blood per rectum) ?Active Problems: ?  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  Mass of cecum ?  Chronic anticoagulation ?  Essential hypertension ?  Sleep apnea-on C-Pap ?  Chronic diastolic CHF (congestive heart failure) ([place]) ?  Persistent atrial fibrillation ([place]) ?  Chronic kidney disease, stage 3a ([place]) ?  Elevated troponin level not due myocardial infarction ?  Gallstones ?  Chronic atrial fibrillation (HCC) ?  Polyp of colon ?  Anemia due to chronic blood loss ?  Acute on chronic lower GI bleeding ? ? ?   * No surgery found * ? ? ? ? ? ?Assessment ? ?Lower GI bleeding most likely from large cecal polyp while fully anticoagulated resolved with holding anticoagulation ? ?(Hospital Stay = 0 days) ? ?Plan: ? ?-Plan to do robotic colectomy "right" proximal along with revision of jejunal colonic bypass anastomosis to simplify things.  And been able to move the case up until next Tuesday.  [place] ? ?Looks like cardiology feels  comfortable with holding full anticoagulation until surgery next week given his issues of recurrent GI bleeding and symptomatic anemia numerous times with resolution holding anticoagulation. ? ?Okay to do solid diet as tolerated.  Consider supplemental shakes and vitamins since he is somewhat malnourished and deconditioned. ? ?Safe to be discharged home from my standpoint.  Discussed the need for a 2-day double dose bowel prep given his significant constipation and need for double dose bowel prep for prior colonoscopies. ?-VTE prophylaxis- SCDs, etc ?-mobilize as tolerated to help recovery ? ? ? ? ? ? ? ?I reviewed nursing notes, Consultant cardiology notes, hospitalist notes, last 24 h vitals and pain scores, last 48 h intake and output, last 24 h labs and trends, and last 24 h imaging results. I have reviewed this patient's available data, including medical history, events of note, test results, etc as part of my evaluation.  A significant portion of that time was spent in counseling.  Care during the described time interval was provided by me. ? ?This care required moderate level of medical decision making.  11/06/2021 ? ? ? ?Subjective: ?(Chief complaint) ? ?Patient sitting up in bed smiling alert. ? ?Denies pain. ? ?In good spirits. ? ?Very appreciative of nursing and medicine care. ? ?Objective: ? ?Vital signs: ? ?Vitals:  ? 11/05/21 0817 11/05/21 1633 11/05/21 2039 11/06/21 0148  ?BP: (!) 116/55 (!) 118/55 (!) 110/54 (!) 131/56  ?Pulse: 70 69 73 68  ?Resp: '17 17 17 16  '$ ?Temp: 98 ?F (36.7 ?C) 98.2 ?F (36.8 ?C) 97.7 ?F (36.5 ?C) 97.7 ?F (36.5 ?C)  ?TempSrc: Oral   Oral  ?SpO2: 90% 91% 95% 91%  ? ? ?  Last BM Date : 11/04/21 ? ?Intake/Output  ? ?Yesterday: ? No intake/output data recorded. ?This shift: ? No intake/output data recorded. ? ?Bowel function: ? Flatus: YES ? BM:  YES ? Drain: (No drain) ? ? ?Physical Exam: ? ?General: Pt awake/alert in no acute distress ?Eyes: PERRL, normal EOM.  Sclera clear.  No  icterus ?Neuro: CN II-XII intact w/o focal sensory/motor deficits. ?Lymph: No head/neck/groin lymphadenopathy ?Psych:  No delerium/psychosis/paranoia.  Oriented x 4 ?HENT: Normocephalic, Mucus membranes moist.  No thrush ?Neck: Supple, No tracheal deviation.  No obvious thyromegaly ?Chest: No pain to chest wall compression.  Good respiratory excursion.  No audible wheezing ?CV:  Pulses intact.  Regular rhythm.  No major extremity edema ?MS: Normal AROM mjr joints.  No obvious deformity ? ?Abdomen: Soft.  Nondistended.  Mildly tender at incisions only.  No evidence of peritonitis.  No incarcerated hernias. ? ?Ext:   No deformity.  No mjr edema.  No cyanosis ?Skin: No petechiae / purpurea.  No major sores.  Warm and dry ? ? ? ?Results:  ? ?Cultures: ?Recent Results (from the past 720 hour(s))  ?Resp Panel by RT-PCR (Flu A&B, Covid) Nasopharyngeal Swab     Status: None  ? Collection Time: 11/02/21  4:07 PM  ? Specimen: Nasopharyngeal Swab; Nasopharyngeal(NP) swabs in vial transport medium  ?Result Value Ref Range Status  ? SARS Coronavirus 2 by RT PCR NEGATIVE NEGATIVE Final  ?  Comment: (NOTE) ?SARS-CoV-2 target nucleic acids are NOT DETECTED. ? ?The SARS-CoV-2 RNA is generally detectable in upper respiratory ?specimens during the acute phase of infection. The lowest ?concentration of SARS-CoV-2 viral copies this assay can detect is ?138 copies/mL. A negative result does not preclude SARS-Cov-2 ?infection and should not be used as the sole basis for treatment or ?other patient management decisions. A negative result may occur with  ?improper specimen collection/handling, submission of specimen other ?than nasopharyngeal swab, presence of viral mutation(s) within the ?areas targeted by this assay, and inadequate number of viral ?copies(<138 copies/mL). A negative result must be combined with ?clinical observations, patient history, and epidemiological ?information. The expected result is Negative. ? ?Fact Sheet for  Patients:  ?EntrepreneurPulse.com.au ? ?Fact Sheet for Healthcare Providers:  ?IncredibleEmployment.be ? ?This test is no t yet approved or cleared by the [place] FDA and  ?has been authorized for detection and/or diagnosis of SARS-CoV-2 by ?FDA under an Emergency Use Authorization (EUA). This EUA will remain  ?in effect (meaning this test can be used) for the duration of the ?COVID-19 declaration under Section 564(b)(1) of the Act, 21 ?U.S.C.section 360bbb-3(b)(1), unless the authorization is terminated  ?or revoked sooner.  ? ? ?  ? Influenza A by PCR NEGATIVE NEGATIVE Final  ? Influenza B by PCR NEGATIVE NEGATIVE Final  ?  Comment: (NOTE) ?The Xpert Xpress SARS-CoV-2/FLU/RSV plus assay is intended as an aid ?in the diagnosis of influenza from Nasopharyngeal swab specimens and ?should not be used as a sole basis for treatment. Nasal washings and ?aspirates are unacceptable for Xpert Xpress SARS-CoV-2/FLU/RSV ?testing. ? ?Fact Sheet for Patients: ?EntrepreneurPulse.com.au ? ?Fact Sheet for Healthcare Providers: ?IncredibleEmployment.be ? ?This test is not yet approved or cleared by the [place] FDA and ?has been authorized for detection and/or diagnosis of SARS-CoV-2 by ?FDA under an Emergency Use Authorization (EUA). This EUA will remain ?in effect (meaning this test can be used) for the duration of the ?COVID-19 declaration under Section 564(b)(1) of the Act, 21 U.S.C. ?section 360bbb-3(b)(1), unless the authorization is  terminated or ?revoked. ? ?Performed at [facility], [place] [address], [place], [place] ?[address] ?  ? ? ?Labs: ?Results for orders placed or performed during the hospital encounter of 11/02/21 (from the past 48 hour(s))  ?Hemoglobin and hematocrit, blood     Status: Abnormal  ? Collection Time: 11/04/21  6:34 PM  ?Result Value Ref Range  ? Hemoglobin 11.4 (L) 13.0 - 17.0 g/dL  ? HCT 37.2 (L) 39.0 - 52.0 %  ?   Comment: Performed at [facility], [place] [address], [place], [place] [address]  ?Prealbumin     Status: Abnormal  ? Collection Time: 11/05/21  5:38 AM  ?Result Value Ref Range  ? Prealbumin 12.4 (L) 18 - 38 m

## 2021-11-06 NOTE — Progress Notes (Signed)
? Eric Mayer ?[id number] ?06/30/1941 ? ?CARE TEAM: ? ?PCP: [name], MD ? ?Outpatient Care Team: Patient Care Team: ?[name], MD as PCP - General (Internal Medicine) ?[name], MD as PCP - Cardiology (Cardiology) ?[name], [name], MD as PCP - Advanced Heart Failure (Cardiology) ?[name], NP as Nurse Practitioner (Nurse Practitioner) ?[name], MD as Consulting Physician (Gastroenterology) ?[name], MD as Consulting Physician (General Surgery) ?[name], MD as Consulting Physician (Cardiology) ? ?Inpatient Treatment Team: Treatment Team: Attending Provider: [name], MD; Consulting Physician: [name], MD; Rounding Team: Lbcardiology, [name], MD; Mobility Specialist: [name], [name]; Registered Nurse: [name], [name], RN; Technician: [name], NT; Rounding Team: [name], MD; Utilization Review: [place], [name], RN ? ? ?Problem List:  ? ?Principal Problem: ?  BRBPR (bright red blood per rectum) ?Active Problems: ?  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  Mass of cecum ?  Chronic anticoagulation ?  Essential hypertension ?  Sleep apnea-on C-Pap ?  Chronic diastolic CHF (congestive heart failure) ([place]) ?  Persistent atrial fibrillation ([place]) ?  Chronic kidney disease, stage 3a ([place]) ?  Elevated troponin level not due myocardial infarction ?  Gallstones ?  Chronic atrial fibrillation ([facility]) ?  Polyp of colon ?  Anemia due to chronic blood loss ?  Acute on chronic lower GI bleeding ? ? ?   * No surgery found * ? ? ? ? ? ?Assessment ? ?Lower GI bleeding most likely from large cecal polyp while fully anticoagulated resolved with holding anticoagulation ? ?(Hospital Stay = 0 days) ? ?Plan: ? ?-Plan to do robotic colectomy "right" proximal along with revision of jejunal colonic bypass anastomosis to simplify things.  And been able to move the case up until next Tuesday.  [name] ? ?Looks like cardiology feels  comfortable with holding full anticoagulation until surgery next week given his issues of recurrent GI bleeding and symptomatic anemia numerous times with resolution holding anticoagulation. ? ?Okay to do solid diet as tolerated.  Consider supplemental shakes and vitamins since he is somewhat malnourished and deconditioned. ? ?Safe to be discharged home from my standpoint.  Discussed the need for a 2-day double dose bowel prep given his significant constipation and need for double dose bowel prep for prior colonoscopies. ?-VTE prophylaxis- SCDs, etc ?-mobilize as tolerated to help recovery ? ? ? ? ? ? ? ?I reviewed nursing notes, Consultant cardiology notes, hospitalist notes, last 24 h vitals and pain scores, last 48 h intake and output, last 24 h labs and trends, and last 24 h imaging results. I have reviewed this patient's available data, including medical history, events of note, test results, etc as part of my evaluation.  A significant portion of that time was spent in counseling.  Care during the described time interval was provided by me. ? ?This care required moderate level of medical decision making.  11/06/2021 ? ? ? ?Subjective: ?(Chief complaint) ? ?Patient sitting up in bed smiling alert. ? ?Denies pain. ? ?In good spirits. ? ?Very appreciative of nursing and medicine care. ? ?Objective: ? ?Vital signs: ? ?Vitals:  ? 11/05/21 0817 11/05/21 1633 11/05/21 2039 11/06/21 0148  ?BP: (!) 116/55 (!) 118/55 (!) 110/54 (!) 131/56  ?Pulse: 70 69 73 68  ?Resp: '17 17 17 16  '$ ?Temp: 98 ?F (36.7 ?C) 98.2 ?F (36.8 ?C) 97.7 ?F (36.5 ?C) 97.7 ?F (36.5 ?C)  ?TempSrc: Oral   Oral  ?SpO2: 90% 91% 95% 91%  ? ? ?  Last BM Date : 11/04/21 ? ?Intake/Output  ? ?Yesterday: ? No intake/output data recorded. ?This shift: ? No intake/output data recorded. ? ?Bowel function: ? Flatus: YES ? BM:  YES ? Drain: (No drain) ? ? ?Physical Exam: ? ?General: Pt awake/alert in no acute distress ?Eyes: PERRL, normal EOM.  Sclera clear.  No  icterus ?Neuro: CN II-XII intact w/o focal sensory/motor deficits. ?Lymph: No head/neck/groin lymphadenopathy ?Psych:  No delerium/psychosis/paranoia.  Oriented x 4 ?HENT: Normocephalic, Mucus membranes moist.  No thrush ?Neck: Supple, No tracheal deviation.  No obvious thyromegaly ?Chest: No pain to chest wall compression.  Good respiratory excursion.  No audible wheezing ?CV:  Pulses intact.  Regular rhythm.  No major extremity edema ?MS: Normal AROM mjr joints.  No obvious deformity ? ?Abdomen: Soft.  Nondistended.  Mildly tender at incisions only.  No evidence of peritonitis.  No incarcerated hernias. ? ?Ext:   No deformity.  No mjr edema.  No cyanosis ?Skin: No petechiae / purpurea.  No major sores.  Warm and dry ? ? ? ?Results:  ? ?Cultures: ?Recent Results (from the past 720 hour(s))  ?Resp Panel by RT-PCR (Flu A&B, Covid) Nasopharyngeal Swab     Status: None  ? Collection Time: 11/02/21  4:07 PM  ? Specimen: Nasopharyngeal Swab; Nasopharyngeal(NP) swabs in vial transport medium  ?Result Value Ref Range Status  ? SARS Coronavirus 2 by RT PCR NEGATIVE NEGATIVE Final  ?  Comment: (NOTE) ?SARS-CoV-2 target nucleic acids are NOT DETECTED. ? ?The SARS-CoV-2 RNA is generally detectable in upper respiratory ?specimens during the acute phase of infection. The lowest ?concentration of SARS-CoV-2 viral copies this assay can detect is ?138 copies/mL. A negative result does not preclude SARS-Cov-2 ?infection and should not be used as the sole basis for treatment or ?other patient management decisions. A negative result may occur with  ?improper specimen collection/handling, submission of specimen other ?than nasopharyngeal swab, presence of viral mutation(s) within the ?areas targeted by this assay, and inadequate number of viral ?copies(<138 copies/mL). A negative result must be combined with ?clinical observations, patient history, and epidemiological ?information. The expected result is Negative. ? ?Fact Sheet for  Patients:  ?EntrepreneurPulse.com.au ? ?Fact Sheet for Healthcare Providers:  ?IncredibleEmployment.be ? ?This test is no t yet approved or cleared by the [place] FDA and  ?has been authorized for detection and/or diagnosis of SARS-CoV-2 by ?FDA under an Emergency Use Authorization (EUA). This EUA will remain  ?in effect (meaning this test can be used) for the duration of the ?COVID-19 declaration under Section 564(b)(1) of the Act, 21 ?U.S.C.section 360bbb-3(b)(1), unless the authorization is terminated  ?or revoked sooner.  ? ? ?  ? Influenza A by PCR NEGATIVE NEGATIVE Final  ? Influenza B by PCR NEGATIVE NEGATIVE Final  ?  Comment: (NOTE) ?The Xpert Xpress SARS-CoV-2/FLU/RSV plus assay is intended as an aid ?in the diagnosis of influenza from Nasopharyngeal swab specimens and ?should not be used as a sole basis for treatment. Nasal washings and ?aspirates are unacceptable for Xpert Xpress SARS-CoV-2/FLU/RSV ?testing. ? ?Fact Sheet for Patients: ?EntrepreneurPulse.com.au ? ?Fact Sheet for Healthcare Providers: ?IncredibleEmployment.be ? ?This test is not yet approved or cleared by the [place] FDA and ?has been authorized for detection and/or diagnosis of SARS-CoV-2 by ?FDA under an Emergency Use Authorization (EUA). This EUA will remain ?in effect (meaning this test can be used) for the duration of the ?COVID-19 declaration under Section 564(b)(1) of the Act, 21 U.S.C. ?section 360bbb-3(b)(1), unless the authorization is  terminated or ?revoked. ? ?Performed at [facility], [place] [address], [place], [place] ?[address] ?  ? ? ?Labs: ?Results for orders placed or performed during the hospital encounter of 11/02/21 (from the past 48 hour(s))  ?Hemoglobin and hematocrit, blood     Status: Abnormal  ? Collection Time: 11/04/21  6:34 PM  ?Result Value Ref Range  ? Hemoglobin 11.4 (L) 13.0 - 17.0 g/dL  ? HCT 37.2 (L) 39.0 - 52.0 %  ?   Comment: Performed at [facility], [place] [address], [place], [place] [address]  ?Prealbumin     Status: Abnormal  ? Collection Time: 11/05/21  5:38 AM  ?Result Value Ref Range  ? Prealbumin 12.4 (L) 18 - 38 m

## 2021-11-06 NOTE — Discharge Summary (Signed)
Physician Discharge Summary  ?Eric Mayer HTD:[id number] DOB: 12-Jul-1941 DOA: 11/02/2021 ? ?PCP: [name], MD ? ?Admit date: 11/02/2021 ?Discharge date: 11/06/2021 ? ?Admitted From: Home ?Disposition: Home ? ?Recommendations for Outpatient Follow-up:  ?Follow up with PCP in 1-2 weeks ?Please obtain BMP/CBC in one week your next doctors visit.  ?Please schedule for surgery for robotic right colectomy and revision of jejunal colonic bypass anastomosis by general surgery team on upcoming Tuesday at [facility].  Patient has been given instructions by their service. ?I have ordered twice daily MiraLAX for next few days to help with his constipation. ?He has been advised to hold Eliquis until postoperatively ?N.p.o. past midnight on 10/12/2021. ? ? ?Discharge Condition: Stable ?CODE STATUS: Full code ?Diet recommendation: Heart healthy.  Advised to remain n.p.o. past midnight on 10/12/2021 ? ?Brief/Interim Summary: ?81 year old Caucasian male with history of bleeding polyps /cecal mass, awaiting hemicolectomy by the surgical team.  Surgery is planned for Dec 17, 2021.  Surgical team has been consulted to see if they can move this surgery date earlier.  Eliquis is currently on hold.  Patient has history of chronic GI bleed / rectal bleed.  Rectal bleed became worse 2 days prior to presentation.  Other medical history includes diastolic congestive heart failure, atrial fibrillation on Eliquis and chronic kidney disease stage IIIa.   ?Patient is admitted for bright red blood per rectum,  General surgery and GI is consulted.  General surgery has scheduled him for hemicolectomy next Tuesday at [facility].  Cardiology is consulted to determine need for anticoagulation while waiting for hemicolectomy next week. ?H&H remained stable.  Because patient is medically stable he has been discharged with recommendations as stated above for surgery early next week.  All the instructions have been given. ? ?During my  evaluation patient's wife was also present at bedside and all the questions of been answered by me. ? ? ?Assessment and Plan: ?* BRBPR (bright red blood per rectum) ?Secondary to polyp and cecal mass, plan for robotic surgery early next week at [facility].  He has been advised to remain n.p.o. past midnight on 10/12/2021.  Continue to hold Eliquis in the meantime. ? ?Mass of cecum ?Plans for surgery next week ? ?Chronic kidney disease, stage 3a ([place]) ?Chronic, stable, baseline. ? ?Persistent atrial fibrillation ([place]) ?Cont rate control with metoprolol. ?Hold eliquis until postoperatively ? ?Chronic diastolic CHF (congestive heart failure) ([place]) ?Resume home regimen. ? ?Essential hypertension ?Resume home regimen ? ?Chronic anticoagulation ?Hold eliquis in setting of GIB ? ?Elevated troponin level not due myocardial infarction ?H/o same, chronic trop elevation it looks like. ? ?Sleep apnea-on C-Pap ?Cont CPAP ? ? ? ? ?  ?There is no height or weight on file to calculate BMI. ? ?  ? ? ? ?Discharge Diagnoses:  ?Principal Problem: ?  BRBPR (bright red blood per rectum) ?Active Problems: ?  Mass of cecum ?  Chronic anticoagulation ?  Essential hypertension ?  Chronic diastolic CHF (congestive heart failure) ([place]) ?  Persistent atrial fibrillation ([place]) ?  Chronic kidney disease, stage 3a ([place]) ?  Sleep apnea-on C-Pap ?  Elevated troponin level not due myocardial infarction ?  Gallstones ?  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  Chronic atrial fibrillation ([facility]) ?  Polyp of colon ?  Anemia due to chronic blood loss ?  Acute on chronic lower GI bleeding ? ? ? ? ? ?Consultations: ?General surgery ?Cardiology ? ?Subjective: ?Feels well no complaints.  Wishes to go  home. ?Wife is present at bedside. ? ?Discharge Exam: ?Vitals:  ? 11/06/21 0148 11/06/21 0825  ?BP: (!) 131/56 (!) 114/50  ?Pulse: 68 70  ?Resp: 16 19  ?Temp: 97.7 ?F (36.5 ?C) 98.2 ?F (36.8 ?C)  ?SpO2: 91% 92%  ? ?Vitals:  ? 11/05/21 1633 11/05/21 2039  11/06/21 0148 11/06/21 0825  ?BP: (!) 118/55 (!) 110/54 (!) 131/56 (!) 114/50  ?Pulse: 69 73 68 70  ?Resp: '17 17 16 19  '$ ?Temp: 98.2 ?F (36.8 ?C) 97.7 ?F (36.5 ?C) 97.7 ?F (36.5 ?C) 98.2 ?F (36.8 ?C)  ?TempSrc:   Oral Oral  ?SpO2: 91% 95% 91% 92%  ? ? ?General: Pt is alert, awake, not in acute distress ?Cardiovascular: RRR, S1/S2 +, no rubs, no gallops ?Respiratory: CTA bilaterally, no wheezing, no rhonchi ?Abdominal: Soft, NT, ND, bowel sounds + ?Extremities: no edema, no cyanosis ? ?Discharge Instructions ? ? ?Allergies as of 11/06/2021   ? ?   Reactions  ? Ace Inhibitors Cough  ? Xarelto [rivaroxaban] Other (See Comments)  ? Joint pain  ? ?  ? ?  ?Medication List  ?  ? ?TAKE these medications   ? ?acetaminophen 500 MG tablet ?Commonly known as: TYLENOL ?Take 1,000 mg by mouth every 6 (six) hours as needed for headache (pain). ?  ?amoxicillin 500 MG capsule ?Commonly known as: AMOXIL ?Take 2,000 mg by mouth See admin instructions. Take 4 capsules (2000 mg) by mouth one hour prior to dental appointments ?  ?bumetanide 2 MG tablet ?Commonly known as: BUMEX ?TAKE ONE TABLET BY MOUTH EVERY MORNING ?  ?clotrimazole 1 % cream ?Commonly known as: LOTRIMIN ?Apply 1 application topically daily as needed (after shower). ?  ?cyanocobalamin 1000 MCG/ML injection ?Commonly known as: (VITAMIN B-12) ?Inject 1,000 mcg into the muscle every 30 (thirty) days. ?  ?DOCUSATE CALCIUM PO ?Take 250 mg by mouth 2 (two) times daily. ?  ?Eliquis 5 MG Tabs tablet ?Generic drug: apixaban ?TAKE ONE TABLET BY MOUTH AT BREAKFAST AND AT BEDTIME ?  ?escitalopram 20 MG tablet ?Commonly known as: LEXAPRO ?Take 20 mg by mouth daily. ?  ?ferrous sulfate 325 (65 FE) MG tablet ?Take 325 mg by mouth every other day. ?  ?Hemorrhoidal 1-0.25-14.4-15 % Crea ?Generic drug: Pramox-PE-Glycerin-Petrolatum ?Place 1 application rectally daily as needed (Hemorrhoids). ?  ?Jardiance 10 MG Tabs tablet ?Generic drug: empagliflozin ?TAKE ONE TABLET BY MOUTH EVERY  MORNING ?  ?magnesium oxide 400 (241.3 Mg) MG tablet ?Commonly known as: MAG-OX ?Take 400-800 mg by mouth See admin instructions. Take 2 tablets (800 mg) by mouth in the morning & take 1 tablet (400 mg) by mouth in the evening. ?  ?metolazone 5 MG tablet ?Commonly known as: ZAROXOLYN ?Take 5 mg by mouth daily as needed (fluid). ?  ?metoprolol tartrate 50 MG tablet ?Commonly known as: LOPRESSOR ?TAKE ONE TABLET BY MOUTH AT BREAKFAST AND AT BEDTIME ?  ?pantoprazole 40 MG tablet ?Commonly known as: PROTONIX ?Take 40 mg by mouth every morning. ?  ?polyethylene glycol 17 g packet ?Commonly known as: MIRALAX / GLYCOLAX ?Take 34 g by mouth 2 (two) times daily for 4 days. ?  ?potassium chloride SA 20 MEQ tablet ?Commonly known as: KLOR-CON M ?Take 1 tablet (20 mEq total) by mouth daily. ?  ?PRESERVISION AREDS 2 PO ?Take 1 capsule by mouth 2 (two) times a day. ?  ?multivitamin with minerals tablet ?Take 1 tablet by mouth daily. ?  ?Systane Ultra 0.4-0.3 % Soln ?Generic drug: Polyethyl Glycol-Propyl Glycol ?Place 1  drop into both eyes 3 (three) times daily as needed (dry/irritated eyes). ?  ?tamsulosin 0.4 MG Caps capsule ?Commonly known as: FLOMAX ?Take 0.8 mg by mouth at bedtime. ?  ?Vitamin D3 125 MCG (5000 UT) Tabs ?Take 5,000 Units by mouth in the morning. ?  ?witch hazel-glycerin pad ?Commonly known as: TUCKS ?Apply topically as needed for itching. For external hemorrhoids. Also available OTC. ?What changed: how much to take ?  ? ?  ? ? Follow-up Information   ? ? [name], MD Follow up in 1 week(s).   ?Specialty: Internal Medicine ?Contact information: ?[address] ?[address] ?[place] [address] ?[phone] ? ? ?  ?  ? ? [name], MD .   ?Specialty: Cardiology ?Contact information: ?[place] ?[address] ?[place] [address] ?[phone] ? ? ?  ?  ? ? [name], [name], MD .   ?Specialty: Cardiology ?Contact information: ?[address] ?[address] ?[place]  [address] ?[phone] ? ? ?  ?  ? ?  ?  ? ?  ? ?Allergies  ?Allergen Reactions  ? Ace Inhibitors Cough  ? Xarelto [Rivaroxaban] Other (See Comments)  ?  Joint pain  ? ? ?You were cared for by a hospitalist during your hospital stay

## 2021-11-06 NOTE — Progress Notes (Signed)
PERIOPERATIVE PRESCRIPTION FOR IMPLANTED CARDIAC DEVICE PROGRAMMING ? ?Patient Information: ?Name:  Eric Mayer  ?DOB:  [birth year]  ?MRN:  [id number]  ?  ?Planned Procedure:  Robotic colectomy and Lap Cholecystectomy  ?Surgeon:  Dr. [name]  ?Date of Procedure:  11/13/2021  ?Cautery will be used. Yes  ?Position during surgery:  Supine  ? ?Please send documentation back to:  ?[facility] (Fax # [phone])  ?Device Information: ? ?Clinic EP Physician:  Cristopher Peru, MD  ? ?Device Type:  Pacemaker ?Manufacturer and Phone #:  [employer]: [phone] ?Pacemaker Dependent?:  Yes.   ?Date of Last Device Check:  08/20/21 Normal Device Function?:  Yes.   ? ?Electrophysiologist's Recommendations: ? ?Have magnet available. ?Provide continuous ECG monitoring when magnet is used or reprogramming is to be performed.  ?Procedure may interfere with device function.  Magnet should be placed over device during procedure. ? ?Per Device Clinic Standing Orders, ?Wanda Plump, RN  ?12:21 PM 11/06/2021  ?

## 2021-11-06 NOTE — Plan of Care (Signed)
  Problem: Nutrition: Goal: Adequate nutrition will be maintained Outcome: Progressing   Problem: Pain Managment: Goal: General experience of comfort will improve Outcome: Progressing   Problem: Safety: Goal: Ability to remain free from injury will improve Outcome: Progressing   

## 2021-11-06 NOTE — Progress Notes (Addendum)
COVID swab appointment:   Yes x2 ?COVID Vaccine Completed:  08-28-19, 09-25-19 ?Date COVID Vaccine completed: ?Has received booster:  Yes x2 ?COVID vaccine manufacturer:   Moderna ? ?Date of COVID positive in last 90 days:  Negative test 11-02-21 in Epic ? ?PCP - Dr. Koleen Nimrod ?Cardiologist - [name], MD & Dr. Glori Bickers, MD ?Pulmonologist - Court Joy, MD ? ?Cardiac clearance in Epic dated 10-21-21 by B. Curly Shores, PA ? ?Chest x-ray - 11-02-21 Epic ?EKG - 11-03-21 Epic ?Stress Test - greater than 2 years Epic ?ECHO - 05-05-20 Epic ?Cardiac Cath - 09-04-20 Epic ?Pacemaker/ICD device last checked:  08-19-21 Epic ?Spinal Cord Stimulator: ?Cardiac CT - 2019 Epic ? ?Bowel Prep - Patient started his bowel prep on 11-08-21.  ? ?Sleep Study - Yes, +sleep apnea ?CPAP - Yes ? ?Fasting Blood Sugar - N/A ?Checks Blood Sugar _____ times a day ? ?Blood Thinner Instructions:  Eliquis.  Last dose 11-02-21 ?Aspirin Instructions: ?Last Dose: ? ?Activity level:   Can go up a flight of stairs and perform activities of daily living without stopping and without symptoms of chest pain.  Patient states that he has chronic shortness of breath at rest and with exertion, feels that he is at his baseline. ?  ?Anesthesia review:  HTN, CHF, Afib, complete heart block, OSA with CPAP, CKD, pacemaker ? ?Patient denies fever, cough and chest pain at PAT appointment (completed over the phone) ? ?Patient verbalized understanding of instructions that were given to them at the PAT appointment. Patient was also instructed that they will need to review over the PAT instructions again at home before surgery.  ?

## 2021-11-09 ENCOUNTER — Encounter (HOSPITAL_COMMUNITY): Payer: Self-pay | Admitting: Surgery

## 2021-11-09 ENCOUNTER — Other Ambulatory Visit: Payer: Self-pay

## 2021-11-09 NOTE — Progress Notes (Signed)
Anesthesia Chart Review ? ? Case: [id number] Date/Time: 10/29/2021 1200  ? Procedures:  ?    ROBOTIC COLECTOMY PROXIMAL HEMICOLECTOMY, PROBABLE REVISION OF ILEOCOLONIC ANASTOMISIS - 300 ?    XI ROBOTIC ASSISTED LAPAROSCOPIC CHOLECYSTECTOMY  ? Anesthesia type: General  ? Pre-op diagnosis: COLON POLPY UNRESECTABLE BY COLONOSCOPY WITH IRON DEFICIENCY ANEMIA, HISTORY OF PRIOR INTESTINAL BYPASS, GALLSTONES WITH CHRONIC CHOLECYSTITIS  ? Location: [facility] ROOM 05 / [facility]  ? Surgeons: [name], MD  ? ?  ? ? ?DISCUSSION:80 y.o. never smoker with h/o HTN, atrial flutter/atrial fibrillation (Eliquis currently on hold due to bleed), CHF, pacemaker in place, OSA on CPAP, gastric bypass in 1980s, unresectable colon polyp, cholecystitis scheduled for above procedure 10/20/2021 with Dr. [name].  ? ?Recent admission 3/20-3/24/2023 with GI bleed. Seen by cardiology during admission.  Per consult note, "Preop evaluation ?- From a cardiology perspective he may proceed with hemicolectomy with low to moderate overall risk.  He is euvolemic with regards to chronic diastolic heart failure. ?- I would continue to hold his Eliquis as his initial presentation was excessive bleeding.  His hemoglobin remained stable.  His risk of stroke from atrial fibrillation over the next several days prior to surgery is minimal when compared to the risk of harm from recurrent bleeding at this point.  Postsurgery, would resume Eliquis when felt comfortable from a surgical perspective.  Usually this is approximately 24 hours after surgery." ? ?Anticipate pt can proceed with planned procedure barring acute status change.   ?VS: Ht '5\' 10"'$  (1.778 m)   Wt 97.5 kg   BMI 30.85 kg/m?  ? ?PROVIDERS: ?[name], MD is PCP  ? ?Cardiologist - [name], MD & Dr. Glori Bickers, MD ? ?Pulmonologist - Court Joy, MD ? ?LABS: Labs reviewed: Acceptable for surgery. ?(all labs ordered are listed, but only abnormal results are displayed) ? ?Labs Reviewed -  No data to display ? ? ?IMAGES: ? ? ?EKG: ?11/03/2021 ?Rate 79 bpm  ? ? ?CV: ?Echo 05/05/2020 ? 1. Left ventricular ejection fraction, by estimation, is 60 to 65%. The  ?left ventricle has normal function. The left ventricle has no regional  ?wall motion abnormalities. There is severe eccentric left ventricular  ?hypertrophy. Left ventricular diastolic  ?function could not be evaluated.  ? 2. Right ventricular systolic function is normal. The right ventricular  ?size is normal.  ? 3. Left atrial size was moderately dilated.  ? 4. The mitral valve is normal in structure. Mild mitral valve  ?regurgitation.  ? 5. The aortic valve is tricuspid. There is mild calcification of the  ?aortic valve. There is mild thickening of the aortic valve. Aortic valve  ?regurgitation is trivial. No aortic stenosis is present.  ? 6. Aortic dilatation noted. There is mild dilatation of the ascending  ?aorta, measuring 41 mm.  ? ?Myocardial Perfusion 04/21/2018 ?The left ventricular ejection fraction is normal (55-65%). ?Nuclear stress EF: 60%. ?There was no ST segment deviation noted during stress. ?The study is normal. ?This is a low risk study. ?  ?Soft tissue attenuation of anterior wall No ischemia or infarction Estimated EF 60% ?Past Medical History:  ?Diagnosis Date  ? Anemia 1980s X 1  ? Arthritis   ? "hands, knees, hips, ankles" (07/31/2015)  ? Atrial flutter ([place]) 07/31/2015  ? CHF (congestive heart failure) ([place])   ? Chronic diastolic heart failure ([place])   ? a. 11/2015: Echo w/ EF of 60-65%, no WMA, Grade 2 DD, trivial AR, ascending aorta mildly dilated.   ?  Depression   ? Dyspnea   ? History of cardiovascular stress test   ? a. 03/2015: Dobutamine Stress Echo with no evidence of ischemia.   ? History of gastric bypass   ? History of peptic ulcer   ? 1980's  ? History of radiation therapy 1993  ? sarcoma of left groin, tx at [facility]  ? History of sarcoma   ? 1993  LEFT GOIN--  S/P SURGERY, RADIATION AND CHEMO IN [place]  ?  Hypertension   ? Hypogonadism in male   ? Incomplete right bundle branch block   ? Nerve injury   ? 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 Nocturia   ? OSA on CPAP   ? Persistent atrial fibrillation ([place])   ?    ? 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 Primary prostate adenocarcinoma ([place]) DX 07/08/14  ? Gleason 7,  stage T1c  ? PVC's (premature ventricular contractions) 11/21/2015  ? 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 "of groin"  ? Weakness of left leg   ? SECONDARY TO SURGICAL NERVE INJURY OF LEFT GOIN  ? Wears glasses   ? ? ?Past Surgical History:  ?Procedure Laterality Date  ? ATRIAL FIBRILLATION ABLATION N/A 06/08/2018  ? Procedure: ATRIAL FIBRILLATION ABLATION;  Surgeon: Thompson Grayer, MD;  Location: [place] [facility];  Service: Cardiovascular;  Laterality: N/A;  ? ATRIAL FIBRILLATION ABLATION N/A 12/20/2018  ? Procedure: ATRIAL FIBRILLATION ABLATION;  Surgeon: Thompson Grayer, MD;  Location: [place] [facility];  Service: Cardiovascular;  Laterality: N/A;  ? AV NODE ABLATION  02/20/2020  ? AV NODE ABLATION N/A 02/20/2020  ? Procedure: AV NODE ABLATION;  Surgeon: Evans Lance, MD;  Location: [place] [facility];  Service: Cardiovascular;  Laterality: N/A;  ? AV NODE ABLATION N/A 12/29/2020  ? Procedure: AV NODE ABLATION;  Surgeon: Evans Lance, MD;  Location: [place] [facility];  Service: Cardiovascular;  Laterality: N/A;  ? BIOPSY  07/28/2021  ? Procedure: BIOPSY;  Surgeon: [name], MD;  [facility];  Service: Endoscopy;;  ? CARDIAC CATHETERIZATION  08-12-2003  dr Tressia Miners turner  ? Normal coronary arteries, normal wall motion, no sig. abnormalities  ? CARDIOVERSION N/A 08/04/2015  ? Procedure: CARDIOVERSION;  Surgeon: [name], MD;  Location: [place];  Service: Cardiovascular;  Laterality: N/A;  ? CARDIOVERSION N/A 10/28/2017  ? Procedure: CARDIOVERSION;  Surgeon: Larey Dresser, MD;  Location: [facility]  ENDOSCOPY;  Service: Cardiovascular;  Laterality: N/A;  ? CARDIOVERSION N/A 04/12/2018  ? Procedure: CARDIOVERSION;  Surgeon: Thayer Headings, MD;  Location: [place];  Service: Cardiovascular;  Laterality: N/A;  ? CARDIOVERSION N/A 07/26/2018  ? Procedure: CARDIOVERSION;  Surgeon: Larey Dresser, MD;  Location: [facility] ENDOSCOPY;  Service: Cardiovascular;  Laterality: N/A;  ? CARDIOVERSION N/A 10/09/2018  ? Procedure: CARDIOVERSION;  Surgeon: Sueanne Margarita, MD;  Location: [facility] ENDOSCOPY;  Service: Cardiovascular;  Laterality: N/A;  ? COLONOSCOPY  07-03-2002  ? COLONOSCOPY WITH PROPOFOL N/A 07/28/2021  ? Procedure: COLONOSCOPY WITH PROPOFOL;  Surgeon: [name], MD;  [facility];  Service: Endoscopy;  Laterality: N/A;  ? COLONOSCOPY WITH PROPOFOL N/A 09/16/2021  ? Procedure: COLONOSCOPY WITH PROPOFOL;  Surgeon: [name], MD;  [facility];  Service: Endoscopy;  Laterality: N/A;  ? [place]  ? FRACTURE SURGERY    ? [place]  ?  SARCOMA SURGERY  ? INGUINAL HERNIA REPAIR Right 1950s?  ? INSERT / REPLACE / REMOVE PACEMAKER  02/20/2020  ? INTESTINAL BYPASS  1976  ? GASTRIC FOR OBESITY  ? JOINT REPLACEMENT    ? PACEMAKER IMPLANT N/A 02/20/2020  ? Procedure: PACEMAKER IMPLANT;  Surgeon: Evans Lance, MD;  Location: [place] [facility];  Service: Cardiovascular;  Laterality: N/A;  ? PATELLA FRACTURE SURGERY Left ~ 1995  ? "broke it twice; only had OR once"  ? POLYPECTOMY  07/28/2021  ? Procedure: POLYPECTOMY;  Surgeon: [name], MD;  [facility];  Service: Endoscopy;;  ? POLYPECTOMY  09/16/2021  ? Procedure: POLYPECTOMY;  Surgeon: [name], MD;  [facility];  Service: Endoscopy;;  ? PROSTATE BIOPSY  06/2014  ? RADIOACTIVE SEED IMPLANT N/A 10/17/2014  ? Procedure: RADIOACTIVE SEED IMPLANT    ;  Surgeon: Ailene Rud, MD;  Location: [facility];  Service: Urology;  Laterality: N/A;   68 SEEDS IMPLANTED ?   ? RIGHT HEART CATH N/A 09/04/2020  ? Procedure: RIGHT HEART CATH;  Surgeon: Jolaine Artist, MD;  Location: [place] [facility];  Service: Cardiovascular;  Laterality: N/A;  ? TEE WITHOUT CARDIOVERSION N/A 3/15/

## 2021-11-09 NOTE — Anesthesia Preprocedure Evaluation (Addendum)
Anesthesia Evaluation  ?Patient identified by MRN, date of birth, ID band ?Patient awake ? ? ? ?Reviewed: ?Allergy & Precautions, NPO status , Patient's Chart, lab work & pertinent test results, reviewed documented beta blocker date and time  ? ?Airway ?Mallampati: I ? ?TM Distance: >3 FB ?Neck ROM: Full ? ? ? Dental ?no notable dental hx. ?(+) Teeth Intact, Dental Advisory Given ?  ?Pulmonary ?shortness of breath and with exertion, asthma , sleep apnea and Continuous Positive Airway Pressure Ventilation ,  ?  ?Pulmonary exam normal ?breath sounds clear to auscultation ? ? ? ? ? ? Cardiovascular ?hypertension, Pt. on medications and Pt. on home beta blockers ?+CHF  ?Normal cardiovascular exam+ dysrhythmias Atrial Fibrillation + pacemaker  ?Rhythm:Regular Rate:Normal ? ? ?  ?Neuro/Psych ?PSYCHIATRIC DISORDERS Depression Weakness left leg due to nerve injury during resection of left groin sarcoma ?  ? GI/Hepatic ?Neg liver ROS, Cholelithiasis with cholecystitis ?Unresectable colon polyp ?Bowel adhesions ? ?Hx/o gastric bypass ?  ?Endo/Other  ? ? Renal/GU ?Renal InsufficiencyRenal disease  ? ?Hx/o Prostate Ca S/P Radioactive seed implant ? ?  ?Musculoskeletal ? ?(+) Arthritis , Ankylosing spondylitis ?Hx/o sarcoma left groin S/P resection and RT  ? Abdominal ?(+) + obese,   ?Peds ? Hematology ? ?(+) Blood dyscrasia, anemia , Eliquis therapy   ?Anesthesia Other Findings ? ? Reproductive/Obstetrics ? ?  ? ? ? ? ? ? ? ? ? ? ? ? ? ?  ?  ? ? ? ? ? ? ?Anesthesia Physical ?Anesthesia Plan ? ?ASA: 3 ? ?Anesthesia Plan: General  ? ?Post-op Pain Management: Dilaudid IV, Precedex and Ofirmev IV (intra-op)*  ? ?Induction: Intravenous ? ?PONV Risk Score and Plan: 4 or greater and Treatment may vary due to age or medical condition and Ondansetron ? ?Airway Management Planned: Oral ETT ? ?Additional Equipment: None ? ?Intra-op Plan:  ? ?Post-operative Plan:  ? ?Informed Consent: I have reviewed the  patients History and Physical, chart, labs and discussed the procedure including the risks, benefits and alternatives for the proposed anesthesia with the patient or authorized representative who has indicated his/her understanding and acceptance.  ? ? ? ?Dental advisory given ? ?Plan Discussed with: CRNA and Anesthesiologist ? ?Anesthesia Plan Comments: (See PAT note 11/09/2021, Konrad Felix Ward, PA-C)  ? ? ? ? ? ?Anesthesia Quick Evaluation ? ?

## 2021-11-10 ENCOUNTER — Other Ambulatory Visit: Payer: Self-pay

## 2021-11-10 ENCOUNTER — Inpatient Hospital Stay (HOSPITAL_COMMUNITY)
Admission: RE | Admit: 2021-11-10 | Discharge: 2021-12-14 | DRG: 329 | Disposition: E | Payer: Medicare Other | Attending: Surgery | Admitting: Surgery

## 2021-11-10 ENCOUNTER — Inpatient Hospital Stay (HOSPITAL_COMMUNITY): Payer: Medicare Other | Admitting: Physician Assistant

## 2021-11-10 ENCOUNTER — Encounter (HOSPITAL_COMMUNITY): Admission: RE | Disposition: E | Payer: Self-pay | Source: Home / Self Care | Attending: Surgery

## 2021-11-10 ENCOUNTER — Encounter (HOSPITAL_COMMUNITY): Payer: Self-pay | Admitting: Surgery

## 2021-11-10 DIAGNOSIS — I272 Pulmonary hypertension, unspecified: Secondary | ICD-10-CM | POA: Diagnosis not present

## 2021-11-10 DIAGNOSIS — J9601 Acute respiratory failure with hypoxia: Secondary | ICD-10-CM | POA: Diagnosis not present

## 2021-11-10 DIAGNOSIS — G4733 Obstructive sleep apnea (adult) (pediatric): Secondary | ICD-10-CM | POA: Diagnosis present

## 2021-11-10 DIAGNOSIS — Z950 Presence of cardiac pacemaker: Secondary | ICD-10-CM

## 2021-11-10 DIAGNOSIS — N1831 Chronic kidney disease, stage 3a: Secondary | ICD-10-CM | POA: Diagnosis present

## 2021-11-10 DIAGNOSIS — M419 Scoliosis, unspecified: Secondary | ICD-10-CM | POA: Diagnosis present

## 2021-11-10 DIAGNOSIS — Z8249 Family history of ischemic heart disease and other diseases of the circulatory system: Secondary | ICD-10-CM

## 2021-11-10 DIAGNOSIS — I442 Atrioventricular block, complete: Secondary | ICD-10-CM | POA: Diagnosis present

## 2021-11-10 DIAGNOSIS — Z8546 Personal history of malignant neoplasm of prostate: Secondary | ICD-10-CM

## 2021-11-10 DIAGNOSIS — Z79899 Other long term (current) drug therapy: Secondary | ICD-10-CM

## 2021-11-10 DIAGNOSIS — Z96651 Presence of right artificial knee joint: Secondary | ICD-10-CM | POA: Diagnosis present

## 2021-11-10 DIAGNOSIS — I4901 Ventricular fibrillation: Secondary | ICD-10-CM | POA: Diagnosis not present

## 2021-11-10 DIAGNOSIS — E278 Other specified disorders of adrenal gland: Secondary | ICD-10-CM | POA: Diagnosis not present

## 2021-11-10 DIAGNOSIS — Y838 Other surgical procedures as the cause of abnormal reaction of the patient, or of later complication, without mention of misadventure at the time of the procedure: Secondary | ICD-10-CM | POA: Diagnosis not present

## 2021-11-10 DIAGNOSIS — G9340 Encephalopathy, unspecified: Secondary | ICD-10-CM | POA: Diagnosis not present

## 2021-11-10 DIAGNOSIS — J9811 Atelectasis: Secondary | ICD-10-CM | POA: Diagnosis not present

## 2021-11-10 DIAGNOSIS — R490 Dysphonia: Secondary | ICD-10-CM | POA: Diagnosis not present

## 2021-11-10 DIAGNOSIS — K9189 Other postprocedural complications and disorders of digestive system: Secondary | ICD-10-CM | POA: Diagnosis not present

## 2021-11-10 DIAGNOSIS — K8010 Calculus of gallbladder with chronic cholecystitis without obstruction: Secondary | ICD-10-CM | POA: Diagnosis not present

## 2021-11-10 DIAGNOSIS — D120 Benign neoplasm of cecum: Secondary | ICD-10-CM | POA: Diagnosis not present

## 2021-11-10 DIAGNOSIS — I5033 Acute on chronic diastolic (congestive) heart failure: Secondary | ICD-10-CM | POA: Diagnosis not present

## 2021-11-10 DIAGNOSIS — Z515 Encounter for palliative care: Secondary | ICD-10-CM

## 2021-11-10 DIAGNOSIS — Z9884 Bariatric surgery status: Secondary | ICD-10-CM

## 2021-11-10 DIAGNOSIS — G4730 Sleep apnea, unspecified: Secondary | ICD-10-CM | POA: Diagnosis present

## 2021-11-10 DIAGNOSIS — K635 Polyp of colon: Secondary | ICD-10-CM | POA: Diagnosis not present

## 2021-11-10 DIAGNOSIS — J90 Pleural effusion, not elsewhere classified: Secondary | ICD-10-CM | POA: Diagnosis not present

## 2021-11-10 DIAGNOSIS — I4819 Other persistent atrial fibrillation: Secondary | ICD-10-CM | POA: Diagnosis not present

## 2021-11-10 DIAGNOSIS — R6521 Severe sepsis with septic shock: Secondary | ICD-10-CM | POA: Diagnosis not present

## 2021-11-10 DIAGNOSIS — Z66 Do not resuscitate: Secondary | ICD-10-CM | POA: Diagnosis present

## 2021-11-10 DIAGNOSIS — N179 Acute kidney failure, unspecified: Secondary | ICD-10-CM | POA: Diagnosis not present

## 2021-11-10 DIAGNOSIS — E871 Hypo-osmolality and hyponatremia: Secondary | ICD-10-CM | POA: Diagnosis present

## 2021-11-10 DIAGNOSIS — R1310 Dysphagia, unspecified: Secondary | ICD-10-CM | POA: Diagnosis present

## 2021-11-10 DIAGNOSIS — K5909 Other constipation: Secondary | ICD-10-CM | POA: Diagnosis present

## 2021-11-10 DIAGNOSIS — N400 Enlarged prostate without lower urinary tract symptoms: Secondary | ICD-10-CM | POA: Diagnosis present

## 2021-11-10 DIAGNOSIS — I110 Hypertensive heart disease with heart failure: Secondary | ICD-10-CM

## 2021-11-10 DIAGNOSIS — D500 Iron deficiency anemia secondary to blood loss (chronic): Secondary | ICD-10-CM | POA: Diagnosis not present

## 2021-11-10 DIAGNOSIS — J189 Pneumonia, unspecified organism: Secondary | ICD-10-CM | POA: Diagnosis not present

## 2021-11-10 DIAGNOSIS — Z85831 Personal history of malignant neoplasm of soft tissue: Secondary | ICD-10-CM

## 2021-11-10 DIAGNOSIS — F419 Anxiety disorder, unspecified: Secondary | ICD-10-CM | POA: Diagnosis present

## 2021-11-10 DIAGNOSIS — I4892 Unspecified atrial flutter: Secondary | ICD-10-CM | POA: Diagnosis present

## 2021-11-10 DIAGNOSIS — A419 Sepsis, unspecified organism: Secondary | ICD-10-CM | POA: Diagnosis not present

## 2021-11-10 DIAGNOSIS — I9589 Other hypotension: Secondary | ICD-10-CM | POA: Diagnosis not present

## 2021-11-10 DIAGNOSIS — Y95 Nosocomial condition: Secondary | ICD-10-CM | POA: Diagnosis not present

## 2021-11-10 DIAGNOSIS — R443 Hallucinations, unspecified: Secondary | ICD-10-CM | POA: Diagnosis not present

## 2021-11-10 DIAGNOSIS — R34 Anuria and oliguria: Secondary | ICD-10-CM | POA: Diagnosis not present

## 2021-11-10 DIAGNOSIS — K922 Gastrointestinal hemorrhage, unspecified: Secondary | ICD-10-CM | POA: Diagnosis not present

## 2021-11-10 DIAGNOSIS — Z841 Family history of disorders of kidney and ureter: Secondary | ICD-10-CM

## 2021-11-10 DIAGNOSIS — I517 Cardiomegaly: Secondary | ICD-10-CM | POA: Diagnosis not present

## 2021-11-10 DIAGNOSIS — R188 Other ascites: Secondary | ICD-10-CM | POA: Diagnosis not present

## 2021-11-10 DIAGNOSIS — I472 Ventricular tachycardia, unspecified: Secondary | ICD-10-CM | POA: Diagnosis not present

## 2021-11-10 DIAGNOSIS — I130 Hypertensive heart and chronic kidney disease with heart failure and stage 1 through stage 4 chronic kidney disease, or unspecified chronic kidney disease: Secondary | ICD-10-CM | POA: Diagnosis present

## 2021-11-10 DIAGNOSIS — K6389 Other specified diseases of intestine: Secondary | ICD-10-CM | POA: Diagnosis present

## 2021-11-10 DIAGNOSIS — G9341 Metabolic encephalopathy: Secondary | ICD-10-CM | POA: Diagnosis not present

## 2021-11-10 DIAGNOSIS — D509 Iron deficiency anemia, unspecified: Secondary | ICD-10-CM | POA: Diagnosis not present

## 2021-11-10 DIAGNOSIS — K567 Ileus, unspecified: Secondary | ICD-10-CM | POA: Diagnosis not present

## 2021-11-10 DIAGNOSIS — I700 Atherosclerosis of aorta: Secondary | ICD-10-CM | POA: Diagnosis not present

## 2021-11-10 DIAGNOSIS — E861 Hypovolemia: Secondary | ICD-10-CM | POA: Diagnosis not present

## 2021-11-10 DIAGNOSIS — K811 Chronic cholecystitis: Secondary | ICD-10-CM | POA: Diagnosis not present

## 2021-11-10 DIAGNOSIS — Z4682 Encounter for fitting and adjustment of non-vascular catheter: Secondary | ICD-10-CM | POA: Diagnosis not present

## 2021-11-10 DIAGNOSIS — R918 Other nonspecific abnormal finding of lung field: Secondary | ICD-10-CM | POA: Diagnosis not present

## 2021-11-10 DIAGNOSIS — N200 Calculus of kidney: Secondary | ICD-10-CM | POA: Diagnosis not present

## 2021-11-10 DIAGNOSIS — I5032 Chronic diastolic (congestive) heart failure: Secondary | ICD-10-CM | POA: Diagnosis not present

## 2021-11-10 DIAGNOSIS — I509 Heart failure, unspecified: Secondary | ICD-10-CM | POA: Diagnosis not present

## 2021-11-10 DIAGNOSIS — R0602 Shortness of breath: Secondary | ICD-10-CM | POA: Diagnosis not present

## 2021-11-10 DIAGNOSIS — Z20822 Contact with and (suspected) exposure to covid-19: Secondary | ICD-10-CM | POA: Diagnosis not present

## 2021-11-10 DIAGNOSIS — I10 Essential (primary) hypertension: Secondary | ICD-10-CM | POA: Diagnosis present

## 2021-11-10 DIAGNOSIS — Z01818 Encounter for other preprocedural examination: Principal | ICD-10-CM

## 2021-11-10 DIAGNOSIS — K388 Other specified diseases of appendix: Secondary | ICD-10-CM | POA: Diagnosis not present

## 2021-11-10 DIAGNOSIS — R739 Hyperglycemia, unspecified: Secondary | ICD-10-CM

## 2021-11-10 DIAGNOSIS — Z7901 Long term (current) use of anticoagulants: Secondary | ICD-10-CM

## 2021-11-10 DIAGNOSIS — Z923 Personal history of irradiation: Secondary | ICD-10-CM

## 2021-11-10 HISTORY — DX: Dyspnea, unspecified: R06.00

## 2021-11-10 LAB — TYPE AND SCREEN
ABO/RH(D): O POS
Antibody Screen: NEGATIVE

## 2021-11-10 LAB — HEMOGLOBIN A1C
Hgb A1c MFr Bld: 4.8 % (ref 4.8–5.6)
Mean Plasma Glucose: 91.06 mg/dL

## 2021-11-10 SURGERY — COLECTOMY, SIGMOID, ROBOT-ASSISTED
Anesthesia: General

## 2021-11-10 MED ORDER — GABAPENTIN 300 MG PO CAPS
300.0000 mg | ORAL_CAPSULE | ORAL | Status: AC
  Administered 2021-11-10: 300 mg via ORAL
  Filled 2021-11-10: qty 1

## 2021-11-10 MED ORDER — POLYETHYLENE GLYCOL 3350 17 GM/SCOOP PO POWD: 1.0000 | Freq: Once | ORAL | Status: DC

## 2021-11-10 MED ORDER — PROCHLORPERAZINE EDISYLATE 10 MG/2ML IJ SOLN: 5.0000 mg | Freq: Four times a day (QID) | INTRAMUSCULAR | Status: DC | PRN

## 2021-11-10 MED ORDER — BISACODYL 5 MG PO TBEC: 20.0000 mg | DELAYED_RELEASE_TABLET | Freq: Once | ORAL | Status: DC

## 2021-11-10 MED ORDER — ALBUTEROL SULFATE HFA 108 (90 BASE) MCG/ACT IN AERS
INHALATION_SPRAY | RESPIRATORY_TRACT | Status: AC
  Filled 2021-11-10: qty 6.7

## 2021-11-10 MED ORDER — ONDANSETRON HCL 4 MG/2ML IJ SOLN: 4.0000 mg | Freq: Once | INTRAMUSCULAR | Status: DC | PRN

## 2021-11-10 MED ORDER — OXYCODONE HCL 5 MG/5ML PO SOLN: 5.0000 mg | Freq: Once | ORAL | Status: DC | PRN

## 2021-11-10 MED ORDER — ENSURE SURGERY PO LIQD
237.0000 mL | Freq: Two times a day (BID) | ORAL | Status: DC
  Administered 2021-11-11 – 2021-11-12 (×3): 237 mL via ORAL
  Filled 2021-11-10 (×3): qty 237

## 2021-11-10 MED ORDER — NEOMYCIN SULFATE 500 MG PO TABS
1000.0000 mg | ORAL_TABLET | ORAL | Status: DC
  Filled 2021-11-10: qty 2

## 2021-11-10 MED ORDER — ORAL CARE MOUTH RINSE: 15.0000 mL | Freq: Once | OROMUCOSAL | Status: AC

## 2021-11-10 MED ORDER — PHENYLEPHRINE HCL (PRESSORS) 10 MG/ML IV SOLN
INTRAVENOUS | Status: AC
  Filled 2021-11-10: qty 1

## 2021-11-10 MED ORDER — PROPOFOL 10 MG/ML IV BOLUS
INTRAVENOUS | Status: AC
  Filled 2021-11-10: qty 20

## 2021-11-10 MED ORDER — ALBUMIN HUMAN 5 % IV SOLN
INTRAVENOUS | Status: AC
  Filled 2021-11-10: qty 250

## 2021-11-10 MED ORDER — ROCURONIUM BROMIDE 10 MG/ML (PF) SYRINGE
PREFILLED_SYRINGE | INTRAVENOUS | Status: AC
  Filled 2021-11-10: qty 10

## 2021-11-10 MED ORDER — METRONIDAZOLE 500 MG PO TABS
1000.0000 mg | ORAL_TABLET | ORAL | Status: DC
  Filled 2021-11-10: qty 2

## 2021-11-10 MED ORDER — PROCHLORPERAZINE MALEATE 10 MG PO TABS
10.0000 mg | ORAL_TABLET | Freq: Four times a day (QID) | ORAL | Status: DC | PRN
  Filled 2021-11-10: qty 1

## 2021-11-10 MED ORDER — SODIUM CHLORIDE 0.9 % IV SOLN
2.0000 g | INTRAVENOUS | Status: AC
  Administered 2021-11-10: 2 g via INTRAVENOUS
  Filled 2021-11-10: qty 2

## 2021-11-10 MED ORDER — CHLORHEXIDINE GLUCONATE 0.12 % MT SOLN
15.0000 mL | Freq: Once | OROMUCOSAL | Status: AC
  Administered 2021-11-10: 15 mL via OROMUCOSAL

## 2021-11-10 MED ORDER — HYDRALAZINE HCL 20 MG/ML IJ SOLN: 10.0000 mg | INTRAMUSCULAR | Status: DC | PRN

## 2021-11-10 MED ORDER — LACTATED RINGERS IV SOLN
INTRAVENOUS | Status: AC | PRN
  Administered 2021-11-10: 1000 mL

## 2021-11-10 MED ORDER — ALUM & MAG HYDROXIDE-SIMETH 200-200-20 MG/5ML PO SUSP
30.0000 mL | Freq: Four times a day (QID) | ORAL | Status: DC | PRN
  Administered 2021-11-13: 30 mL via ORAL
  Filled 2021-11-10 (×2): qty 30

## 2021-11-10 MED ORDER — HYDROMORPHONE HCL 1 MG/ML IJ SOLN: 0.5000 mg | INTRAMUSCULAR | Status: DC | PRN

## 2021-11-10 MED ORDER — LACTATED RINGERS IV SOLN: INTRAVENOUS | Status: DC | PRN

## 2021-11-10 MED ORDER — SODIUM CHLORIDE 0.9 % IV SOLN: 250.0000 mL | INTRAVENOUS | Status: DC | PRN

## 2021-11-10 MED ORDER — PROPOFOL 10 MG/ML IV BOLUS
INTRAVENOUS | Status: DC | PRN
  Administered 2021-11-10: 150 mg via INTRAVENOUS

## 2021-11-10 MED ORDER — ENSURE PRE-SURGERY PO LIQD
592.0000 mL | Freq: Once | ORAL | Status: DC
  Filled 2021-11-10: qty 592

## 2021-11-10 MED ORDER — TRAMADOL HCL 50 MG PO TABS
50.0000 mg | ORAL_TABLET | Freq: Four times a day (QID) | ORAL | Status: DC | PRN
  Filled 2021-11-10: qty 1

## 2021-11-10 MED ORDER — SODIUM CHLORIDE 0.9% FLUSH
3.0000 mL | Freq: Two times a day (BID) | INTRAVENOUS | Status: DC
  Administered 2021-11-11 – 2021-11-14 (×7): 3 mL via INTRAVENOUS

## 2021-11-10 MED ORDER — DEXAMETHASONE SODIUM PHOSPHATE 10 MG/ML IJ SOLN
INTRAMUSCULAR | Status: DC | PRN
  Administered 2021-11-10: 4 mg via INTRAVENOUS

## 2021-11-10 MED ORDER — SODIUM CHLORIDE 0.9 % IV SOLN
2.0000 g | Freq: Two times a day (BID) | INTRAVENOUS | Status: AC
  Administered 2021-11-10: 2 g via INTRAVENOUS
  Filled 2021-11-10: qty 2

## 2021-11-10 MED ORDER — FENTANYL CITRATE (PF) 100 MCG/2ML IJ SOLN
INTRAMUSCULAR | Status: AC
  Filled 2021-11-10: qty 2

## 2021-11-10 MED ORDER — INDOCYANINE GREEN 25 MG IV SOLR
INTRAVENOUS | Status: DC | PRN
  Administered 2021-11-10: 8.75 mg via INTRAVENOUS

## 2021-11-10 MED ORDER — ACETAMINOPHEN 500 MG PO TABS
1000.0000 mg | ORAL_TABLET | Freq: Four times a day (QID) | ORAL | Status: DC
  Administered 2021-11-10 – 2021-11-12 (×5): 1000 mg via ORAL
  Filled 2021-11-10 (×6): qty 2

## 2021-11-10 MED ORDER — METHOCARBAMOL 500 MG PO TABS
1000.0000 mg | ORAL_TABLET | Freq: Four times a day (QID) | ORAL | Status: DC | PRN
Start: 2021-11-10 — End: 2021-11-14
  Administered 2021-11-13: 1000 mg via ORAL
  Filled 2021-11-10 (×2): qty 2

## 2021-11-10 MED ORDER — ONDANSETRON HCL 4 MG/2ML IJ SOLN
INTRAMUSCULAR | Status: DC | PRN
  Administered 2021-11-10: 4 mg via INTRAVENOUS

## 2021-11-10 MED ORDER — DEXAMETHASONE SODIUM PHOSPHATE 10 MG/ML IJ SOLN
INTRAMUSCULAR | Status: AC
  Filled 2021-11-10: qty 1

## 2021-11-10 MED ORDER — ALVIMOPAN 12 MG PO CAPS
12.0000 mg | ORAL_CAPSULE | ORAL | Status: AC
  Administered 2021-11-10: 12 mg via ORAL
  Filled 2021-11-10: qty 1

## 2021-11-10 MED ORDER — PHENYLEPHRINE HCL-NACL 20-0.9 MG/250ML-% IV SOLN
INTRAVENOUS | Status: DC | PRN
  Administered 2021-11-10: 50 ug/min via INTRAVENOUS

## 2021-11-10 MED ORDER — METOPROLOL TARTRATE 5 MG/5ML IV SOLN: 5.0000 mg | Freq: Four times a day (QID) | INTRAVENOUS | Status: DC | PRN

## 2021-11-10 MED ORDER — ENOXAPARIN SODIUM 40 MG/0.4ML IJ SOSY
40.0000 mg | PREFILLED_SYRINGE | INTRAMUSCULAR | Status: DC
  Administered 2021-11-11 – 2021-11-12 (×2): 40 mg via SUBCUTANEOUS
  Filled 2021-11-10 (×2): qty 0.4

## 2021-11-10 MED ORDER — DIPHENHYDRAMINE HCL 50 MG/ML IJ SOLN: 12.5000 mg | Freq: Four times a day (QID) | INTRAMUSCULAR | Status: DC | PRN

## 2021-11-10 MED ORDER — OXYCODONE HCL 5 MG PO TABS: 5.0000 mg | ORAL_TABLET | Freq: Once | ORAL | Status: DC | PRN

## 2021-11-10 MED ORDER — ACETAMINOPHEN 500 MG PO TABS
1000.0000 mg | ORAL_TABLET | ORAL | Status: AC
Start: 2021-11-10 — End: 2021-11-10
  Administered 2021-11-10: 1000 mg via ORAL
  Filled 2021-11-10: qty 2

## 2021-11-10 MED ORDER — 0.9 % SODIUM CHLORIDE (POUR BTL) OPTIME
TOPICAL | Status: DC | PRN
  Administered 2021-11-10: 2000 mL

## 2021-11-10 MED ORDER — FENTANYL CITRATE (PF) 250 MCG/5ML IJ SOLN
INTRAMUSCULAR | Status: DC | PRN
  Administered 2021-11-10: 25 ug via INTRAVENOUS
  Administered 2021-11-10: 50 ug via INTRAVENOUS
  Administered 2021-11-10: 25 ug via INTRAVENOUS

## 2021-11-10 MED ORDER — BUPIVACAINE-EPINEPHRINE (PF) 0.25% -1:200000 IJ SOLN
INTRAMUSCULAR | Status: DC | PRN
  Administered 2021-11-10: 60 mL

## 2021-11-10 MED ORDER — ALBUMIN HUMAN 5 % IV SOLN: INTRAVENOUS | Status: DC | PRN

## 2021-11-10 MED ORDER — ALVIMOPAN 12 MG PO CAPS
12.0000 mg | ORAL_CAPSULE | Freq: Two times a day (BID) | ORAL | Status: DC
  Administered 2021-11-11 – 2021-11-13 (×5): 12 mg via ORAL
  Filled 2021-11-10 (×8): qty 1

## 2021-11-10 MED ORDER — LIP MEDEX EX OINT
1.0000 "application " | TOPICAL_OINTMENT | Freq: Two times a day (BID) | CUTANEOUS | Status: DC
  Administered 2021-11-10 – 2021-11-13 (×6): 1 via TOPICAL
  Filled 2021-11-10 (×2): qty 7

## 2021-11-10 MED ORDER — ONDANSETRON HCL 4 MG PO TABS
4.0000 mg | ORAL_TABLET | Freq: Four times a day (QID) | ORAL | Status: DC | PRN
  Administered 2021-11-12: 4 mg via ORAL
  Filled 2021-11-10: qty 1

## 2021-11-10 MED ORDER — PHENYLEPHRINE 40 MCG/ML (10ML) SYRINGE FOR IV PUSH (FOR BLOOD PRESSURE SUPPORT)
PREFILLED_SYRINGE | INTRAVENOUS | Status: DC | PRN
  Administered 2021-11-10 (×3): 80 ug via INTRAVENOUS

## 2021-11-10 MED ORDER — HYDROMORPHONE HCL 1 MG/ML IJ SOLN: 0.2500 mg | INTRAMUSCULAR | Status: DC | PRN

## 2021-11-10 MED ORDER — ONDANSETRON HCL 4 MG/2ML IJ SOLN
INTRAMUSCULAR | Status: AC
  Filled 2021-11-10: qty 2

## 2021-11-10 MED ORDER — SODIUM CHLORIDE 0.9% FLUSH: 3.0000 mL | INTRAVENOUS | Status: DC | PRN

## 2021-11-10 MED ORDER — DEXMEDETOMIDINE (PRECEDEX) IN NS 20 MCG/5ML (4 MCG/ML) IV SYRINGE
PREFILLED_SYRINGE | INTRAVENOUS | Status: AC
  Filled 2021-11-10: qty 5

## 2021-11-10 MED ORDER — SIMETHICONE 80 MG PO CHEW
40.0000 mg | CHEWABLE_TABLET | Freq: Four times a day (QID) | ORAL | Status: DC | PRN
  Administered 2021-11-12: 40 mg via ORAL
  Filled 2021-11-10: qty 1

## 2021-11-10 MED ORDER — LACTATED RINGERS IV BOLUS
1000.0000 mL | Freq: Three times a day (TID) | INTRAVENOUS | Status: AC | PRN
  Administered 2021-11-11: 1000 mL via INTRAVENOUS

## 2021-11-10 MED ORDER — STERILE WATER FOR INJECTION IJ SOLN
INTRAMUSCULAR | Status: AC
  Filled 2021-11-10: qty 20

## 2021-11-10 MED ORDER — DIPHENHYDRAMINE HCL 12.5 MG/5ML PO ELIX: 12.5000 mg | ORAL_SOLUTION | Freq: Four times a day (QID) | ORAL | Status: DC | PRN

## 2021-11-10 MED ORDER — MAGIC MOUTHWASH
15.0000 mL | Freq: Four times a day (QID) | ORAL | Status: DC | PRN
Start: 2021-11-10 — End: 2021-11-11
  Filled 2021-11-10: qty 15

## 2021-11-10 MED ORDER — BUPIVACAINE-EPINEPHRINE (PF) 0.25% -1:200000 IJ SOLN
INTRAMUSCULAR | Status: AC
  Filled 2021-11-10: qty 60

## 2021-11-10 MED ORDER — LACTATED RINGERS IV SOLN: INTRAVENOUS | Status: AC

## 2021-11-10 MED ORDER — BUPIVACAINE LIPOSOME 1.3 % IJ SUSP: 20.0000 mL | Freq: Once | INTRAMUSCULAR | Status: DC

## 2021-11-10 MED ORDER — ENSURE PRE-SURGERY PO LIQD
296.0000 mL | Freq: Once | ORAL | Status: DC
  Filled 2021-11-10: qty 296

## 2021-11-10 MED ORDER — ENOXAPARIN SODIUM 40 MG/0.4ML IJ SOSY
40.0000 mg | PREFILLED_SYRINGE | Freq: Once | INTRAMUSCULAR | Status: AC
  Administered 2021-11-10: 40 mg via SUBCUTANEOUS
  Filled 2021-11-10: qty 0.4

## 2021-11-10 MED ORDER — LACTATED RINGERS IV SOLN: INTRAVENOUS | Status: DC

## 2021-11-10 MED ORDER — BUPIVACAINE LIPOSOME 1.3 % IJ SUSP
INTRAMUSCULAR | Status: DC | PRN
  Administered 2021-11-10: 20 mL

## 2021-11-10 MED ORDER — MELATONIN 3 MG PO TABS: 3.0000 mg | ORAL_TABLET | Freq: Every evening | ORAL | Status: DC | PRN

## 2021-11-10 MED ORDER — BUPIVACAINE LIPOSOME 1.3 % IJ SUSP
INTRAMUSCULAR | Status: AC
  Filled 2021-11-10: qty 20

## 2021-11-10 MED ORDER — ROCURONIUM BROMIDE 10 MG/ML (PF) SYRINGE
PREFILLED_SYRINGE | INTRAVENOUS | Status: DC | PRN
  Administered 2021-11-10: 30 mg via INTRAVENOUS
  Administered 2021-11-10: 70 mg via INTRAVENOUS
  Administered 2021-11-10 (×2): 20 mg via INTRAVENOUS

## 2021-11-10 MED ORDER — CALCIUM POLYCARBOPHIL 625 MG PO TABS
625.0000 mg | ORAL_TABLET | Freq: Two times a day (BID) | ORAL | Status: DC
  Administered 2021-11-10 – 2021-11-12 (×5): 625 mg via ORAL
  Filled 2021-11-10 (×5): qty 1

## 2021-11-10 MED ORDER — SUGAMMADEX SODIUM 200 MG/2ML IV SOLN
INTRAVENOUS | Status: DC | PRN
  Administered 2021-11-10: 200 mg via INTRAVENOUS

## 2021-11-10 MED ORDER — ENALAPRILAT 1.25 MG/ML IV SOLN
0.6250 mg | Freq: Four times a day (QID) | INTRAVENOUS | Status: DC | PRN
  Filled 2021-11-10: qty 1

## 2021-11-10 MED ORDER — ONDANSETRON HCL 4 MG/2ML IJ SOLN
4.0000 mg | Freq: Four times a day (QID) | INTRAMUSCULAR | Status: DC | PRN
  Administered 2021-11-13: 4 mg via INTRAVENOUS
  Filled 2021-11-10: qty 2

## 2021-11-10 MED ORDER — LIDOCAINE 2% (20 MG/ML) 5 ML SYRINGE
INTRAMUSCULAR | Status: DC | PRN
  Administered 2021-11-10: 40 mg via INTRAVENOUS

## 2021-11-10 SURGICAL SUPPLY — 139 items
APL PRP STRL LF DISP 70% ISPRP (MISCELLANEOUS) ×1
APPLIER CLIP 5 13 M/L LIGAMAX5 (MISCELLANEOUS)
APPLIER CLIP ROT 10 11.4 M/L (STAPLE)
APR CLP MED LRG 11.4X10 (STAPLE)
APR CLP MED LRG 5 ANG JAW (MISCELLANEOUS)
BAG COUNTER SPONGE SURGICOUNT (BAG) ×1 IMPLANT
BAG RETRIEVAL 10 (BASKET)
BAG SPNG CNTER NS LX DISP (BAG)
BLADE EXTENDED COATED 6.5IN (ELECTRODE) IMPLANT
BLADE SURG SZ11 CARB STEEL (BLADE) ×2 IMPLANT
CANNULA REDUC XI 12-8 STAPL (CANNULA) ×2
CANNULA REDUCER 12-8 DVNC XI (CANNULA) IMPLANT
CELLS DAT CNTRL 66122 CELL SVR (MISCELLANEOUS) IMPLANT
CHLORAPREP W/TINT 26 (MISCELLANEOUS) ×2 IMPLANT
CLIP APPLIE 5 13 M/L LIGAMAX5 (MISCELLANEOUS) IMPLANT
CLIP APPLIE ROT 10 11.4 M/L (STAPLE) IMPLANT
CLIP LIGATING HEM O LOK PURPLE (MISCELLANEOUS) ×2 IMPLANT
CLIP LIGATING HEMO O LOK GREEN (MISCELLANEOUS) IMPLANT
COVER SURGICAL LIGHT HANDLE (MISCELLANEOUS) ×4 IMPLANT
COVER TIP SHEARS 8 DVNC (MISCELLANEOUS) ×1 IMPLANT
COVER TIP SHEARS 8MM DA VINCI (MISCELLANEOUS) ×2
DEFOGGER SCOPE WARMER CLEARIFY (MISCELLANEOUS) ×1 IMPLANT
DEVICE TROCAR PUNCTURE CLOSURE (ENDOMECHANICALS) IMPLANT
DRAIN CHANNEL 19F RND (DRAIN) IMPLANT
DRAPE ARM DVNC X/XI (DISPOSABLE) ×4 IMPLANT
DRAPE COLUMN DVNC XI (DISPOSABLE) ×1 IMPLANT
DRAPE DA VINCI XI ARM (DISPOSABLE) ×8
DRAPE DA VINCI XI COLUMN (DISPOSABLE) ×2
DRAPE SURG IRRIG POUCH 19X23 (DRAPES) ×2 IMPLANT
DRSG OPSITE POSTOP 4X10 (GAUZE/BANDAGES/DRESSINGS) IMPLANT
DRSG OPSITE POSTOP 4X6 (GAUZE/BANDAGES/DRESSINGS) IMPLANT
DRSG OPSITE POSTOP 4X8 (GAUZE/BANDAGES/DRESSINGS) IMPLANT
DRSG TEGADERM 2-3/8X2-3/4 SM (GAUZE/BANDAGES/DRESSINGS) ×5 IMPLANT
DRSG TEGADERM 4X4.75 (GAUZE/BANDAGES/DRESSINGS) IMPLANT
ELECT PENCIL ROCKER SW 15FT (MISCELLANEOUS) ×1 IMPLANT
ELECT REM PT RETURN 15FT ADLT (MISCELLANEOUS) ×2 IMPLANT
ENDOLOOP SUT PDS II  0 18 (SUTURE)
ENDOLOOP SUT PDS II 0 18 (SUTURE) IMPLANT
EVACUATOR SILICONE 100CC (DRAIN) IMPLANT
GAUZE 4X4 16PLY ~~LOC~~+RFID DBL (SPONGE) ×1 IMPLANT
GAUZE SPONGE 2X2 8PLY STRL LF (GAUZE/BANDAGES/DRESSINGS) ×1 IMPLANT
GLOVE SRG 8 PF TXTR STRL LF DI (GLOVE) ×2 IMPLANT
GLOVE SURG NEOPR MICRO LF SZ8 (GLOVE) ×6 IMPLANT
GLOVE SURG UNDER LTX SZ8 (GLOVE) ×6 IMPLANT
GLOVE SURG UNDER POLY LF SZ8 (GLOVE) ×4
GOWN STRL REUS W/ TWL XL LVL3 (GOWN DISPOSABLE) ×4 IMPLANT
GOWN STRL REUS W/TWL XL LVL3 (GOWN DISPOSABLE) ×8
GRASPER SUT TROCAR 14GX15 (MISCELLANEOUS) IMPLANT
HOLDER FOLEY CATH W/STRAP (MISCELLANEOUS) ×1 IMPLANT
IRRIG SUCT STRYKERFLOW 2 WTIP (MISCELLANEOUS) ×2
IRRIGATION SUCT STRKRFLW 2 WTP (MISCELLANEOUS) ×1 IMPLANT
KIT BASIN OR (CUSTOM PROCEDURE TRAY) ×2 IMPLANT
KIT PROCEDURE DA VINCI SI (MISCELLANEOUS)
KIT PROCEDURE DVNC SI (MISCELLANEOUS) IMPLANT
KIT SIGMOIDOSCOPE (SET/KITS/TRAYS/PACK) IMPLANT
KIT TURNOVER KIT A (KITS) ×1 IMPLANT
NDL INSUFFLATION 14GA 120MM (NEEDLE) ×1 IMPLANT
NEEDLE HYPO 22GX1.5 SAFETY (NEEDLE) ×2 IMPLANT
NEEDLE INSUFFLATION 14GA 120MM (NEEDLE) ×2 IMPLANT
PACK CARDIOVASCULAR III (CUSTOM PROCEDURE TRAY) ×2 IMPLANT
PACK COLON (CUSTOM PROCEDURE TRAY) ×2 IMPLANT
PAD POSITIONING PINK XL (MISCELLANEOUS) ×2 IMPLANT
PENCIL SMOKE EVACUATOR (MISCELLANEOUS) IMPLANT
PROTECTOR NERVE ULNAR (MISCELLANEOUS) ×3 IMPLANT
RELOAD STAPLE 45 3.5 BLU DVNC (STAPLE) IMPLANT
RELOAD STAPLE 45 4.3 GRN DVNC (STAPLE) IMPLANT
RELOAD STAPLE 60 2.5 WHT DVNC (STAPLE) IMPLANT
RELOAD STAPLE 60 3.5 BLU DVNC (STAPLE) IMPLANT
RELOAD STAPLE 60 4.3 GRN DVNC (STAPLE) IMPLANT
RELOAD STAPLER 2.5X60 WHT DVNC (STAPLE) ×1 IMPLANT
RELOAD STAPLER 3.5X45 BLU DVNC (STAPLE) IMPLANT
RELOAD STAPLER 3.5X60 BLU DVNC (STAPLE) ×4 IMPLANT
RELOAD STAPLER 4.3X45 GRN DVNC (STAPLE) IMPLANT
RELOAD STAPLER 4.3X60 GRN DVNC (STAPLE) IMPLANT
RETRACTOR WND ALEXIS 18 MED (MISCELLANEOUS) IMPLANT
RTRCTR WOUND ALEXIS 18CM MED (MISCELLANEOUS)
SCISSORS LAP 5X35 DISP (ENDOMECHANICALS) ×2 IMPLANT
SEAL CANN UNIV 5-8 DVNC XI (MISCELLANEOUS) ×4 IMPLANT
SEAL XI 5MM-8MM UNIVERSAL (MISCELLANEOUS) ×8
SEALER VESSEL DA VINCI XI (MISCELLANEOUS) ×2
SEALER VESSEL EXT DVNC XI (MISCELLANEOUS) ×1 IMPLANT
SOLUTION ELECTROLUBE (MISCELLANEOUS) ×2 IMPLANT
SPIKE FLUID TRANSFER (MISCELLANEOUS) ×1 IMPLANT
SPONGE GAUZE 2X2 STER 10/PKG (GAUZE/BANDAGES/DRESSINGS)
SPONGE T-LAP 18X36 ~~LOC~~+RFID STR (SPONGE) ×1 IMPLANT
STAPLER 45 DA VINCI SURE FORM (STAPLE)
STAPLER 45 SUREFORM DVNC (STAPLE) IMPLANT
STAPLER 60 DA VINCI SURE FORM (STAPLE) ×2
STAPLER 60 SUREFORM DVNC (STAPLE) IMPLANT
STAPLER CANNULA SEAL DVNC XI (STAPLE) ×1 IMPLANT
STAPLER CANNULA SEAL XI (STAPLE) ×2
STAPLER ECHELON POWER CIR 29 (STAPLE) IMPLANT
STAPLER ECHELON POWER CIR 31 (STAPLE) IMPLANT
STAPLER RELOAD 2.5X60 WHITE (STAPLE) ×2
STAPLER RELOAD 2.5X60 WHT DVNC (STAPLE) ×1
STAPLER RELOAD 3.5X45 BLU DVNC (STAPLE)
STAPLER RELOAD 3.5X45 BLUE (STAPLE)
STAPLER RELOAD 3.5X60 BLU DVNC (STAPLE) ×4
STAPLER RELOAD 3.5X60 BLUE (STAPLE) ×8
STAPLER RELOAD 4.3X45 GREEN (STAPLE)
STAPLER RELOAD 4.3X45 GRN DVNC (STAPLE)
STAPLER RELOAD 4.3X60 GREEN (STAPLE)
STAPLER RELOAD 4.3X60 GRN DVNC (STAPLE)
STAPLER VISISTAT 35W (STAPLE) IMPLANT
STOPCOCK 4 WAY LG BORE MALE ST (IV SETS) ×4 IMPLANT
SURGILUBE 2OZ TUBE FLIPTOP (MISCELLANEOUS) IMPLANT
SUT MNCRL AB 4-0 PS2 18 (SUTURE) ×2 IMPLANT
SUT PDS AB 1 CT1 27 (SUTURE) ×2 IMPLANT
SUT PROLENE 0 CT 2 (SUTURE) IMPLANT
SUT PROLENE 2 0 KS (SUTURE) ×2 IMPLANT
SUT PROLENE 2 0 SH DA (SUTURE) IMPLANT
SUT SILK 2 0 (SUTURE)
SUT SILK 2 0 SH CR/8 (SUTURE) IMPLANT
SUT SILK 2-0 18XBRD TIE 12 (SUTURE) IMPLANT
SUT SILK 3 0 (SUTURE)
SUT SILK 3 0 SH CR/8 (SUTURE) ×2 IMPLANT
SUT SILK 3-0 18XBRD TIE 12 (SUTURE) IMPLANT
SUT V-LOC BARB 180 2/0GR6 GS22 (SUTURE) ×8
SUT VIC AB 3-0 SH 18 (SUTURE) IMPLANT
SUT VIC AB 3-0 SH 27 (SUTURE)
SUT VIC AB 3-0 SH 27XBRD (SUTURE) IMPLANT
SUT VICRYL 0 UR6 27IN ABS (SUTURE) ×3 IMPLANT
SUT VLOC BARB 180 ABS3/0GR12 (SUTURE) ×2
SUTURE V-LC BRB 180 2/0GR6GS22 (SUTURE) IMPLANT
SUTURE VLOC BRB 180 ABS3/0GR12 (SUTURE) IMPLANT
SYR 10ML ECCENTRIC (SYRINGE) ×2 IMPLANT
SYR CONTROL 10ML LL (SYRINGE) ×2 IMPLANT
SYS BAG RETRIEVAL 10MM (BASKET)
SYS LAPSCP GELPORT 120MM (MISCELLANEOUS)
SYS WOUND ALEXIS 18CM MED (MISCELLANEOUS) ×2
SYSTEM BAG RETRIEVAL 10MM (BASKET) IMPLANT
SYSTEM LAPSCP GELPORT 120MM (MISCELLANEOUS) IMPLANT
SYSTEM WOUND ALEXIS 18CM MED (MISCELLANEOUS) ×1 IMPLANT
TOWEL OR 17X26 10 PK STRL BLUE (TOWEL DISPOSABLE) ×2 IMPLANT
TOWEL OR NON WOVEN STRL DISP B (DISPOSABLE) ×2 IMPLANT
TRAY FOLEY MTR SLVR 16FR STAT (SET/KITS/TRAYS/PACK) ×2 IMPLANT
TROCAR ADV FIXATION 5X100MM (TROCAR) ×2 IMPLANT
TUBING CONNECTING 10 (TUBING) ×3 IMPLANT
TUBING INSUFFLATION 10FT LAP (TUBING) ×2 IMPLANT

## 2021-11-10 NOTE — Progress Notes (Signed)
Pt is still drowsy and refused to get up and walk at this time. Will try again. ?

## 2021-11-10 NOTE — Op Note (Signed)
10/22/2021 ? ?4:53 PM ? ?PATIENT:  Eric Mayer  81 y.o. male ? ?Patient Care Team: ?[name], MD as PCP - General (Internal Medicine) ?[name], MD as PCP - Cardiology (Cardiology) ?[name], [name], MD as PCP - Advanced Heart Failure (Cardiology) ?[name], NP as Nurse Practitioner (Nurse Practitioner) ?[name], MD as Consulting Physician (Gastroenterology) ?[name], MD as Consulting Physician (General Surgery) ?[name], MD as Consulting Physician (Cardiology) ? ?PRE-OPERATIVE DIAGNOSIS:   ?COLON POLYP UNRESECTABLE BY COLONOSCOPY WITH GI BLEEDING & IRON DEFICIENCY ANEMIA, HISTORY OF PRIOR JEJUNOICOLONIC INTESTINAL BYPASS ?GALLSTONES WITH CHRONIC CHOLECYSTITIS ? ?POST-OPERATIVE DIAGNOSIS:   ?COLON POLYP UNRESECTABLE BY COLONOSCOPY WITH GI BLEEDING & IRON DEFICIENCY ANEMIA, HISTORY OF PRIOR JEJUNO-COLONIC INTESTINAL BYPASS ?GALLSTONES WITH CHRONIC CHOLECYSTITIS ? ?PROCEDURE:   ?-ROBOTIC PROXIMAL EXTENDED HEMICOLECTOMY ?-ROBOTIC RESECTION OF JEJUNO-COLONIC ANASTOMOSIS (EN BLOC TAKEDOWN OF OLD INTESTINAL BYPASS) ?-ROBOTIC CHOLECYSTECTOMY ?-SEROSAL REPAIR ?-INTRAOPERATIVE ASSESSMENT OF TISSUE VASCULAR PERFUSION USING ICG (indocyanine green) IMMUNOFLUORESCENCE ?-TRANSVERSUS ABDOMINIS PLANE (TAP) BLOCK - BILATERAL ? ?SURGEON:  Adin Hector, MD ? ?ASSISTANT: Ralene Ok, MD, FACS ?An experienced assistant was required given the standard of surgical care given the complexity of the case.  This assistant was needed for exposure, dissection, suction, tissue approximation, retraction, perception, etc. ? ?ANESTHESIA:    ? ?General ? ?Nerve block provided with liposomal bupivacaine (Experel) mixed with 0.25% bupivacaine as a Bilateral TAP block x 94m each side at the level of the transverse abdominis & preperitoneal spaces along the flank at the anterior axillary line, from subcostal ridge to iliac crest under laparoscopic guidance  ? ?Local field block at port sites &  extraction wound ? ?EBL:  Total I/O ?In: 2500 [I.V.:1500; IV Piggyback:1000] ?Out: 125 [Urine:75; Blood:50] ? ?Delay start of Pharmacological VTE agent (>24hrs) due to surgical blood loss or risk of bleeding:  no ? ?DRAINS: none  ? ?SPECIMEN: Terminal ileum to mid-distal transverse colon with en bloc old loop jejunal/mid transverse colon anastomosis ? ?DISPOSITION OF SPECIMEN:  PATHOLOGY ? ?COUNTS:  YES ? ?PLAN OF CARE: Admit to inpatient  ? ?PATIENT DISPOSITION:  PACU - hemodynamically stable. ? ?INDICATION:   ? ?Patient with a distant history of an open internal bypass for weight loss in the early 1970s.  Based on films looks consistent with a loop of proximal jejunum to mid transverse colon anastomosis.  History of polyps.  Challenging to do colonoscopy.  Requires full anticoagulation for cardiopulmonary issues.  Has had recurrent episodes of severe anemia requiring admissions and transfusions.  Large cecal polypoid mass noted not felt safe to be resected endoscopically.  I recommended segmental resection: ? ?The anatomy & physiology of the digestive tract was discussed.  The pathophysiology was discussed.  Natural history risks without surgery was discussed.   I worked to give an overview of the disease and the frequent need to have multispecialty involvement.  I feel the risks of no intervention will lead to serious problems that outweigh the operative risks; therefore, I recommended a partial colectomy to remove the pathology.  Laparoscopic & open techniques were discussed.   ?Risks such as bleeding, infection, abscess, leak, reoperation, possible ostomy, hernia, heart attack, death, and other risks were discussed.  I noted a good likelihood this will help address the problem.   Goals of post-operative recovery were discussed as well.  We will work to minimize complications.  An educational handout on the pathology was given as well.  Questions were answered.   ? ?The patient expresses understanding &  wishes  to proceed with surgery. ? ?OR FINDINGS:  ? ?Patient had very dense old adhesions of omentum, small intestine, colon to the anterior abdominal wall consistent with his prior open surgery. ? ?He had a side-to-side loop proximal jejunum to mid/distal transverse colon internal anastomosis.  This anastomosis was resected en bloc with the proximal colon to avoid multiple anastomoses. ? ?Patient had a bulky spherical mass in the cecum at area of suspicion consistent with bleeding polyp. ? ?Patient has a side-to-side jejuno-jejunostomy anastomosis where the former loop jejunal: Ostomy anastomosis was taken down.  Patient has an ileocolonic anastomosis (terminal ileum to mid/distal transverse colon)that rests in the left epigastric region  ? ?CASE DATA: ? ?Type of patient?: Elective [facility] Private Case ? ?Status of Case? Elective Scheduled ? ?Infection Present At Time Of Surgery (PATOS)?  NO ? ?DESCRIPTION:  ? ?Informed consent was confirmed.  The patient underwent general anaesthesia without difficulty.  The patient was positioned with arms tucked & secured appropriately.  VTE prevention in place.  The patient's abdomen was clipped, prepped, & draped in a sterile fashion.  Surgical timeout confirmed our plan. ? ?The patient was positioned in reverse Trendelenburg.  Abdominal entry was gained using Varees entry technique in the  left upper abdomen.  Entry was clean.  I induced carbon dioxide insufflation.  Camera inspection revealed no injury.  Patient did have moderately dense omental adhesions.  We took care to place ports rather laterally and inferiorly away from these adhesions.  I did laparoscopic cold scissors lyse adhesions to allow the port to be placed in an enough room to allow the robot to be docked.   ? ?Continued robotic lyse adhesions using vessel sealer to free all the adhesions of omentum small bowel and colon off the anterior abdominal wall.  I transected adhesions to the falciform ligament as well.  He had  very dense interloop adhesions.  Found the greater omentum and gradually started freeing it off the transverse colon to identify a side-to-side proximal jejunal to mid/distal transverse colon anastomosis.  Freed numerous interloop adhesions of small bowel to small bowel as well as small bowel and transverse colon.  That allowed the small bowel to better mobilize to better identify the ileocecal region.  And then ran the small bowel from the jejunal colonic anastomosis.  Proximal and very dilated and foreshortened.  Probably 3 feet to get to the ligament of Treitz.  Distal and rather decompressed and two thirds of the remaining small bowel.  Took care to free the jejunum off the mid transverse colon mesentery to help find a window between the small bowel to colon anastomosis for better anatomy.  I was able to find a window of colon distal to the jejunal colonic anastomosis.  Because his internal anastomosis was not quite functional and he had very distorted anatomy making colonoscopy difficult already and I do not want him to remain malnourished in recovery, I discussed with the patient about resecting and taking down the internal bypass.  It is a rare instance that he has lived this long to not have major complications from this since this type of internal weight loss procedure was abandoned decades in the home.  He agreed. ? ?I created a window in the junction between the mid and distal transverse colon distal to the jejunal colonic anastomosis and transected through some of the mesentery to get in the lesser sac.  I freed greater omentum off the transverse colon more proximally toward the hepatic flexure.  Came around towards the proximal paracolic gutter as well.  This allowed good mobility of the remaining colon. ? ?He had also had history of biliary colic with enlarged gallbladder with stones.  We discussed cholecystectomy and he was interested in that.  Since it was up to the hepatic flexure and decided  proceed with that.  I freed the omental and mesocolon and duodenal adhesions to the gallbladder which was quite thickened edematous and boggy.  Very dilated.  I mobilized the gallbladder laterally and medially and

## 2021-11-10 NOTE — Interval H&P Note (Signed)
History and Physical Interval Note: ? ?11/01/2021 ?11:29 AM ? ?Eric Mayer  has presented today for surgery, with the diagnosis of COLON POLPY UNRESECTABLE BY COLONOSCOPY WITH IRON DEFICIENCY ANEMIA, HISTORY OF PRIOR INTESTINAL BYPASS, GALLSTONES WITH CHRONIC CHOLECYSTITIS.  The various methods of treatment have been discussed with the patient and family. After consideration of risks, benefits and other options for treatment, the patient has consented to  Procedure(s) with comments: ?ROBOTIC COLECTOMY PROXIMAL HEMICOLECTOMY, PROBABLE REVISION OF ILEOCOLONIC ANASTOMISIS (N/A) - 300 ?XI ROBOTIC ASSISTED LAPAROSCOPIC CHOLECYSTECTOMY (N/A) as a surgical intervention.  The patient's history has been reviewed, patient examined, no change in status, stable for surgery.  I have reviewed the patient's chart and labs.  Questions were answered to the patient's satisfaction.   ? ?I have re-reviewed the the patient's records, history, medications, and allergies.  I have re-examined the patient.  I again discussed intraoperative plans and goals of post-operative recovery.  The patient agrees to proceed. ? ?Eric Mayer  ?August 18, 1940 ?[id number] ? ?Patient Care Team: ?[name], MD as PCP - General (Internal Medicine) ?[name], MD as PCP - Cardiology (Cardiology) ?[name], [name], MD as PCP - Advanced Heart Failure (Cardiology) ?[name], NP as Nurse Practitioner (Nurse Practitioner) ?[name], MD as Consulting Physician (Gastroenterology) ?[name], MD as Consulting Physician (General Surgery) ?[name], MD as Consulting Physician (Cardiology) ? ?Patient Active Problem List  ? Diagnosis Date Noted  ? Mass of cecum 11/02/2021  ?  Priority: High  ? 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  Priority: High  ? Acute on chronic lower GI bleeding 11/04/2021  ? BRBPR (Mayer red blood per rectum) 11/02/2021  ? Ankylosing spondylitis ([place]) 10/20/2021  ? Benign prostatic  hyperplasia 10/20/2021  ? Chronic gastritis 10/20/2021  ? Constipation 10/20/2021  ? Gallstones 10/20/2021  ? Chronic atrial fibrillation ([place]) 10/20/2021  ? Polyp of colon 10/20/2021  ? Steatosis of liver 10/20/2021  ? 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 Recurrent major depression in full remission ([place]) 10/20/2021  ? Pernicious anemia 10/20/2021  ? Personal history of colonic polyps 10/20/2021  ? Iron deficiency anemia 10/20/2021  ? 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 Sepsis due to gram-negative urinary tract infection ([place]) 08/31/2020  ? Sepsis due to gram-negative UTI ([place]) 08/30/2020  ? History of malignant neoplasm of prostate 08/30/2020  ? Complicated UTI (urinary tract infection) 08/30/2020  ? Chronic kidney disease, stage 3a ([place]) 08/30/2020  ? Hyperkalemia 08/30/2020  ? Elevated troponin level not due myocardial infarction 08/30/2020  ? Lactic acidosis 08/30/2020  ? Complete heart block ([place]) 06/03/2020  ? Pacemaker 06/03/2020  ? Atrial fibrillation with rapid ventricular response ([place]) 02/20/2020  ? Persistent atrial fibrillation ([place]) 06/08/2018  ? Allergic rhinitis 10/15/2016  ? Intrinsic asthma 09/16/2016  ? Chronic diastolic CHF (congestive heart failure) ([place]) 07/21/2016  ? PVC's (premature ventricular contractions) 11/21/2015  ? Chronic anticoagulation 08/04/2015  ? Essential hypertension 08/04/2015  ? Obesity 08/04/2015  ? Sleep apnea-on C-Pap 08/04/2015  ? Acute on chronic diastolic heart failure ([place]) 07/31/2015  ? Primary osteoarthritis of right knee 12/17/2014  ? Malignant neoplasm of prostate ([place]) 08/20/2014  ? ? ?Past Medical History:  ?Diagnosis Date  ? Anemia 1980s X 1  ? Arthritis   ? "hands, knees, hips, ankles" (07/31/2015)  ? Atrial flutter ([place]) 07/31/2015  ? CHF (congestive heart failure) ([place])   ? Chronic diastolic heart failure ([place])   ? a. 11/2015: Echo w/ EF of 60-65%, no  WMA, Grade 2 DD, trivial AR, ascending aorta mildly dilated.   ? Depression   ? Dyspnea   ?  History of cardiovascular stress test   ? a. 03/2015: Dobutamine Stress Echo with no evidence of ischemia.   ? History of gastric bypass   ? History of peptic ulcer   ? 1980's  ? History of radiation therapy 1993  ? sarcoma of left groin, tx at [facility]  ? History of sarcoma   ? 1993  LEFT GOIN--  S/P SURGERY, RADIATION AND CHEMO IN [place]  ? Hypertension   ? Hypogonadism in male   ? Incomplete right bundle branch block   ? Nerve injury   ? 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 Nocturia   ? OSA on CPAP   ? Persistent atrial fibrillation ([place])   ?    ? 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 Primary prostate adenocarcinoma ([place]) DX 07/08/14  ? Gleason 7,  stage T1c  ? PVC's (premature ventricular contractions) 11/21/2015  ? 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 "of groin"  ? Weakness of left leg   ? SECONDARY TO SURGICAL NERVE INJURY OF LEFT GOIN  ? Wears glasses   ? ? ?Past Surgical History:  ?Procedure Laterality Date  ? ATRIAL FIBRILLATION ABLATION N/A 06/08/2018  ? Procedure: ATRIAL FIBRILLATION ABLATION;  Surgeon: Thompson Grayer, MD;  Location: [facility];  Service: Cardiovascular;  Laterality: N/A;  ? ATRIAL FIBRILLATION ABLATION N/A 12/20/2018  ? Procedure: ATRIAL FIBRILLATION ABLATION;  Surgeon: Thompson Grayer, MD;  Location: [facility];  Service: Cardiovascular;  Laterality: N/A;  ? AV NODE ABLATION  02/20/2020  ? AV NODE ABLATION N/A 02/20/2020  ? Procedure: AV NODE ABLATION;  Surgeon: Evans Lance, MD;  Location: [facility];  Service: Cardiovascular;  Laterality: N/A;  ? AV NODE ABLATION N/A 12/29/2020  ? Procedure: AV NODE ABLATION;  Surgeon: Evans Lance, MD;  Location: [facility];  Service: Cardiovascular;  Laterality: N/A;  ? BIOPSY  07/28/2021  ? Procedure: BIOPSY;  Surgeon: [name], MD;  Location: [facility];  Service: Endoscopy;;  ? CARDIAC CATHETERIZATION  08-12-2003  dr Tressia Miners turner   ? Normal coronary arteries, normal wall motion, no sig. abnormalities  ? CARDIOVERSION N/A 08/04/2015  ? Procedure: CARDIOVERSION;  Surgeon: [name], MD;  Location: [facility];  Service: Cardiovascular;  Laterality: N/A;  ? CARDIOVERSION N/A 10/28/2017  ? Procedure: CARDIOVERSION;  Surgeon: Larey Dresser, MD;  Location: [facility] ENDOSCOPY;  Service: Cardiovascular;  Laterality: N/A;  ? CARDIOVERSION N/A 04/12/2018  ? Procedure: CARDIOVERSION;  Surgeon: Thayer Headings, MD;  Location: [facility];  Service: Cardiovascular;  Laterality: N/A;  ? CARDIOVERSION N/A 07/26/2018  ? Procedure: CARDIOVERSION;  Surgeon: Larey Dresser, MD;  Location: [facility] ENDOSCOPY;  Service: Cardiovascular;  Laterality: N/A;  ? CARDIOVERSION N/A 10/09/2018  ? Procedure: CARDIOVERSION;  Surgeon: Sueanne Margarita, MD;  Location: [facility] ENDOSCOPY;  Service: Cardiovascular;  Laterality: N/A;  ? COLONOSCOPY  07-03-2002  ? COLONOSCOPY WITH PROPOFOL N/A 07/28/2021  ? Procedure: COLONOSCOPY WITH PROPOFOL;  Surgeon: [name], MD;  Location: [facility];  Service: Endoscopy;  Laterality: N/A;  ? COLONOSCOPY WITH PROPOFOL N/A 09/16/2021  ? Procedure: COLONOSCOPY WITH PROPOFOL;  Surgeon: [name], MD;  Location: [facility];  Service: Endoscopy;  Laterality: N/A;  ? [place]  ? FRACTURE SURGERY    ?  [place]  ? SARCOMA SURGERY  ? INGUINAL HERNIA REPAIR Right 1950s?  ? INSERT / REPLACE / REMOVE PACEMAKER  02/20/2020  ? INTESTINAL BYPASS  1976  ? GASTRIC FOR OBESITY  ? JOINT REPLACEMENT    ? PACEMAKER IMPLANT N/A 02/20/2020  ? Procedure: PACEMAKER IMPLANT;  Surgeon: Evans Lance, MD;  Location: [place];  Service: Cardiovascular;  Laterality: N/A;  ? PATELLA FRACTURE SURGERY Left ~ 1995  ? "broke it twice; only had OR once"  ? POLYPECTOMY  07/28/2021  ? Procedure: POLYPECTOMY;  Surgeon: [name], MD;  Location: [facility];  Service: Endoscopy;;  ? POLYPECTOMY  09/16/2021  ?  Procedure: POLYPECTOMY;  Surgeon: [name], MD;  Location: [facility];  Service: Endoscopy;;  ? PROSTATE BIOPSY  06/2014  ? RADIOACTIVE SEED IMPLANT N/A 10/17/2014  ? Procedure: RADIOACTIVE SEED IMPLANT    ;  Surgeon

## 2021-11-10 NOTE — Anesthesia Procedure Notes (Signed)
Procedure Name: Intubation ?Date/Time: 10/20/2021 11:47 AM ?Performed by: Eben Burow, CRNA ?Pre-anesthesia Checklist: Patient identified, Emergency Drugs available, Suction available and Patient being monitored ?Patient Re-evaluated:Patient Re-evaluated prior to induction ?Oxygen Delivery Method: Circle system utilized ?Preoxygenation: Pre-oxygenation with 100% oxygen ?Induction Type: IV induction ?Ventilation: Mask ventilation without difficulty ?Laryngoscope Size: Glidescope and 4 ?Tube type: Oral ?Number of attempts: 1 ?Airway Equipment and Method: Stylet and Oral airway ?Placement Confirmation: ETT inserted through vocal cords under direct vision, positive ETCO2 and breath sounds checked- equal and bilateral ?Secured at: 23 cm ?Tube secured with: Tape ?Dental Injury: Teeth and Oropharynx as per pre-operative assessment  ?Comments: Glidescope x 1, Keona Sheffler view of vocal cords, easy intubation, +/= BBS, +EtCO2. ? ? ? ? ?

## 2021-11-10 NOTE — Transfer of Care (Signed)
Immediate Anesthesia Transfer of Care Note ? ?Patient: Eric Mayer ? ?Procedure(s) Performed: ROBOTIC COLECTOMY PROXIMAL HEMICOLECTOMY, PROBABLE REVISION OF ILEOCOLONIC ANASTOMISIS ?XI ROBOTIC ASSISTED LAPAROSCOPIC CHOLECYSTECTOMY with Firefly ? ?Patient Location: PACU ? ?Anesthesia Type:General ? ?Level of Consciousness: awake, alert  and oriented ? ?Airway & Oxygen Therapy: Patient Spontanous Breathing and Patient connected to face mask oxygen ? ?Post-op Assessment: Report given to RN, Post -op Vital signs reviewed and stable and Patient moving all extremities X 4 ? ?Post vital signs: Reviewed and stable ? ?Last Vitals:  ?Vitals Value Taken Time  ?BP 97/49 10/24/2021 1703  ?Temp    ?Pulse 70 10/28/2021 1706  ?Resp 23 10/29/2021 1706  ?SpO2 96 % 10/19/2021 1706  ?Vitals shown include unvalidated device data. ? ?Last Pain:  ?Vitals:  ? 10/19/2021 1032  ?TempSrc:   ?PainSc: 0-No pain  ?   ? ?  ? ?Complications: No notable events documented. ?

## 2021-11-11 LAB — BASIC METABOLIC PANEL
Anion gap: 7 (ref 5–15)
BUN: 27 mg/dL — ABNORMAL HIGH (ref 8–23)
CO2: 27 mmol/L (ref 22–32)
Calcium: 8.6 mg/dL — ABNORMAL LOW (ref 8.9–10.3)
Chloride: 103 mmol/L (ref 98–111)
Creatinine, Ser: 1.7 mg/dL — ABNORMAL HIGH (ref 0.61–1.24)
GFR, Estimated: 41 mL/min — ABNORMAL LOW (ref >60)
Glucose, Bld: 132 mg/dL — ABNORMAL HIGH (ref 70–99)
Potassium: 3.9 mmol/L (ref 3.5–5.1)
Sodium: 137 mmol/L (ref 135–145)

## 2021-11-11 LAB — CBC
HCT: 29.8 % — ABNORMAL LOW (ref 39.0–52.0)
Hemoglobin: 9 g/dL — ABNORMAL LOW (ref 13.0–17.0)
MCH: 27.8 pg (ref 26.0–34.0)
MCHC: 30.2 g/dL (ref 30.0–36.0)
MCV: 92 fL (ref 80.0–100.0)
Platelets: 133 10*3/uL — ABNORMAL LOW (ref 150–400)
RBC: 3.24 MIL/uL — ABNORMAL LOW (ref 4.22–5.81)
RDW: 15.5 % (ref 11.5–15.5)
WBC: 11.3 10*3/uL — ABNORMAL HIGH (ref 4.0–10.5)
nRBC: 0 % (ref 0.0–0.2)

## 2021-11-11 LAB — MAGNESIUM: Magnesium: 2.1 mg/dL (ref 1.7–2.4)

## 2021-11-11 LAB — HEMOGLOBIN AND HEMATOCRIT, BLOOD
HCT: 30.1 % — ABNORMAL LOW (ref 39.0–52.0)
Hemoglobin: 9.1 g/dL — ABNORMAL LOW (ref 13.0–17.0)

## 2021-11-11 MED ORDER — SODIUM CHLORIDE 0.9 % IV SOLN
125.0000 mg | Freq: Once | INTRAVENOUS | Status: AC
  Administered 2021-11-11: 125 mg via INTRAVENOUS
  Filled 2021-11-11: qty 10

## 2021-11-11 MED ORDER — EMPAGLIFLOZIN 10 MG PO TABS
10.0000 mg | ORAL_TABLET | Freq: Every morning | ORAL | Status: DC
  Filled 2021-11-11: qty 1

## 2021-11-11 MED ORDER — ADULT MULTIVITAMIN W/MINERALS CH
1.0000 | ORAL_TABLET | Freq: Every day | ORAL | Status: DC
  Administered 2021-11-12 – 2021-11-13 (×2): 1 via ORAL
  Filled 2021-11-11 (×2): qty 1

## 2021-11-11 MED ORDER — VITAMIN D 25 MCG (1000 UNIT) PO TABS
5000.0000 [IU] | ORAL_TABLET | Freq: Every day | ORAL | Status: DC
  Administered 2021-11-12 – 2021-11-13 (×2): 5000 [IU] via ORAL
  Filled 2021-11-11 (×2): qty 5

## 2021-11-11 MED ORDER — ALBUMIN HUMAN 5 % IV SOLN
25.0000 g | Freq: Once | INTRAVENOUS | Status: AC
  Administered 2021-11-11 (×2): 12.5 g via INTRAVENOUS
  Filled 2021-11-11: qty 500
  Filled 2021-11-11: qty 250

## 2021-11-11 MED ORDER — SODIUM CHLORIDE 0.9% FLUSH: 3.0000 mL | INTRAVENOUS | Status: DC | PRN

## 2021-11-11 MED ORDER — POTASSIUM CHLORIDE CRYS ER 20 MEQ PO TBCR
20.0000 meq | EXTENDED_RELEASE_TABLET | Freq: Every day | ORAL | Status: DC
  Administered 2021-11-11 – 2021-11-13 (×3): 20 meq via ORAL
  Filled 2021-11-11 (×4): qty 1

## 2021-11-11 MED ORDER — WITCH HAZEL-GLYCERIN EX PADS: 1.0000 "application " | MEDICATED_PAD | CUTANEOUS | Status: DC | PRN

## 2021-11-11 MED ORDER — LACTATED RINGERS IV BOLUS
1000.0000 mL | Freq: Once | INTRAVENOUS | Status: AC
  Administered 2021-11-11: 1000 mL via INTRAVENOUS

## 2021-11-11 MED ORDER — SODIUM CHLORIDE 0.9% FLUSH
3.0000 mL | Freq: Two times a day (BID) | INTRAVENOUS | Status: DC
Start: 2021-11-11 — End: 2021-11-11

## 2021-11-11 MED ORDER — SODIUM CHLORIDE 0.9 % IV SOLN: 250.0000 mL | INTRAVENOUS | Status: DC | PRN

## 2021-11-11 MED ORDER — ESCITALOPRAM OXALATE 20 MG PO TABS
20.0000 mg | ORAL_TABLET | Freq: Every day | ORAL | Status: DC
  Administered 2021-11-11 – 2021-11-13 (×3): 20 mg via ORAL
  Filled 2021-11-11 (×3): qty 1

## 2021-11-11 MED ORDER — MAGIC MOUTHWASH
15.0000 mL | Freq: Four times a day (QID) | ORAL | Status: AC
Start: 2021-11-11 — End: 2021-11-12
  Administered 2021-11-11 – 2021-11-12 (×8): 15 mL via ORAL
  Filled 2021-11-11 (×8): qty 15

## 2021-11-11 MED ORDER — PANTOPRAZOLE SODIUM 40 MG PO TBEC
40.0000 mg | DELAYED_RELEASE_TABLET | Freq: Every morning | ORAL | Status: DC
  Administered 2021-11-12 – 2021-11-13 (×2): 40 mg via ORAL
  Filled 2021-11-11 (×2): qty 1

## 2021-11-11 NOTE — Progress Notes (Addendum)
? ACEL NATZKE ?[id number] ?09/28/1940 ? ?CARE TEAM: ? ?PCP: [name], MD ? ?Outpatient Care Team: Patient Care Team: ?[name], MD as PCP - General (Internal Medicine) ?[name], MD as PCP - Cardiology (Cardiology) ?[name], [name], MD as PCP - Advanced Heart Failure (Cardiology) ?[name], NP as Nurse Practitioner (Nurse Practitioner) ?[name], MD as Consulting Physician (Gastroenterology) ?[name], MD as Consulting Physician (General Surgery) ?[name], MD as Consulting Physician (Cardiology) ? ?Inpatient Treatment Team: Treatment Team: Attending Provider: [name], MD; Occupational Therapist: Rosemary Holms, OT; Utilization Review: Fortino Sic, RN; Physical Therapist: Karlton Lemon, PT; Registered Nurse: Kai Levins, RN ? ? ?Problem List:  ? ?Principal Problem: ?  Mass of colon ? ? ?1 Day Post-Op  10/15/2021 ? ? ?POST-OPERATIVE DIAGNOSIS:   ?COLON POLYP UNRESECTABLE BY COLONOSCOPY WITH GI BLEEDING & IRON DEFICIENCY ANEMIA, HISTORY OF PRIOR JEJUNO-COLONIC INTESTINAL BYPASS ?GALLSTONES WITH CHRONIC CHOLECYSTITIS ?  ?PROCEDURE:   ?-ROBOTIC PROXIMAL EXTENDED HEMICOLECTOMY ?-ROBOTIC RESECTION OF JEJUNO-COLONIC ANASTOMOSIS (EN BLOC TAKEDOWN OF OLD INTESTINAL BYPASS) ?-ROBOTIC CHOLECYSTECTOMY ?-SEROSAL REPAIR ?-INTRAOPERATIVE ASSESSMENT OF TISSUE VASCULAR PERFUSION USING ICG (indocyanine green) IMMUNOFLUORESCENCE ?-TRANSVERSUS ABDOMINIS PLANE (TAP) BLOCK - BILATERAL ?  ?SURGEON:  Adin Hector, MD ? ?OR FINDINGS:  ?  ?Patient had very dense old adhesions of omentum, small intestine, colon to the anterior abdominal wall consistent with his prior open surgery. ? ?He had a side-to-side loop proximal jejunum to mid/distal transverse colon internal anastomosis.  This anastomosis was resected en bloc with the proximal colon to avoid multiple anastomoses. ? ?Patient had a bulky spherical mass in the cecum at area of suspicion consistent with bleeding  polyp. ? ?Patient has a side-to-side jejuno-jejunostomy anastomosis where the former loop jejunal: Ostomy anastomosis was taken down.  Patient has an ileocolonic anastomosis (terminal ileum to mid/distal transverse colon)that rests in the left epigastric region  ? ? ? ?Assessment ? ?Recovering ? ?(Hospital Stay = 1 days) ? ?Plan: ?-ERAS enhance recovery pathway ?-Palliate sore throat.  Management 4 times daily.  Hoarseness should improve ?-Dysphagia 1/full liquid diet for now.  Advance to solid diet once has bowel movements.  FiberCon bowel regimen. ?-Elevated creatinine in the setting of chronic kidney disease but nonoliguric.  Should resolve.  Follow creatinine and urine output.  Try and keep on the dry side but low threshold for IV fluid boluses for backup ?-Follow-up on pathology. ?-Some lightheadedness.  Check orthostatics.  Replace IV fluids boluses as needed. ?-VTE prophylaxis- SCDs, etc ?-mobilize as tolerated to help recovery.  Limited with his significant kyphoscoliosis.  Having therapies consult as I suspect at the very least he will need home health and possibly skilled nursing facility with rehab capacity.  We will see ? ?Disposition:  ?Disposition:  ?The patient is from: Home ? ?Anticipate discharge to:  Home with Home Health ? ?Anticipated Date of Discharge is:  April 2,2023 ?  ? ?Barriers to discharge:  Pending Clinical improvement (more likely than not) ? ?Patient currently is NOT MEDICALLY STABLE for discharge from the hospital from a surgery standpoint. ? ? ? ? ? ?I reviewed nursing notes, last 24 h vitals and pain scores, last 48 h intake and output, last 24 h labs and trends, and last 24 h imaging results. I have reviewed this patient's available data, including medical history, events of note, test results, etc as part of my evaluation.  A significant portion of that time was spent in counseling.  Care during the described  time interval was provided by me. ? ?This care required moderate level  of medical decision making.  11/11/2021 ? ? ? ?Subjective: ?(Chief complaint) ? ?Complains of hoarseness/sore throat. ? ?Felt a little lightheaded standing up. ? ?Tolerating liquids.  Diet advance. ? ?Denies much pain. ? ?Objective: ? ?Vital signs: ? ?Vitals:  ? 11/04/2021 2013 10/16/2021 2115 11/11/21 0206 11/11/21 0458  ?BP: 125/65 120/62 (!) 95/45 (!) 94/47  ?Pulse: 70 72 71 71  ?Resp: '18 18 18 18  '$ ?Temp: (!) 97.4 ?F (36.3 ?C) 97.7 ?F (36.5 ?C) 97.6 ?F (36.4 ?C) 97.7 ?F (36.5 ?C)  ?TempSrc: Oral Oral Oral Oral  ?SpO2: 99% 100% 94% 94%  ?Weight:      ?Height:      ? ? ?Last BM Date : 11/07/2021 ? ?Intake/Output  ? ?Yesterday: ? 03/28 0701 - 03/29 0700 ?In: 4570.8 [P.O.:600; I.V.:2870.8; IV Piggyback:1100] ?Out: 476 [Urine:825; Blood:50] ?This shift: ? No intake/output data recorded. ? ?Bowel function: ? Flatus: YES ? BM:  No ? Drain: (No drain) ? ? ?Physical Exam: ? ?General: Pt awake/alert in no acute distress ?Eyes: PERRL, normal EOM.  Sclera clear.  No icterus ?Neuro: CN II-XII intact w/o focal sensory/motor deficits. ?Lymph: No head/neck/groin lymphadenopathy ?Psych:  No delerium/psychosis/paranoia.  Oriented x 4 ?HENT: Normocephalic, Mucus membranes moist.  No thrush.  Initially has some hoarseness but can speak normally. ?Neck: Supple, No tracheal deviation.  No obvious thyromegaly ?Chest: No pain to chest wall compression.  Good respiratory excursion.  No audible wheezing ?CV:  Pulses intact.  Regular rhythm.  No major extremity edema ?MS: Significant kyphoscoliosis which is his baseline.  Normal AROM mjr joints.  No obvious deformity ? ?Abdomen: Soft.  Mildy distended.  Mildly tender at incisions only.  No evidence of peritonitis.  No incarcerated hernias. ? ?Ext:   No deformity.  No mjr edema.  No cyanosis ?Skin: No petechiae / purpurea.  No major sores.  Warm and dry ? ? ? ?Results:  ? ?Cultures: ?Recent Results (from the past 720 hour(s))  ?Resp Panel by RT-PCR (Flu A&B, Covid) Nasopharyngeal Swab     Status:  None  ? Collection Time: 11/02/21  4:07 PM  ? Specimen: Nasopharyngeal Swab; Nasopharyngeal(NP) swabs in vial transport medium  ?Result Value Ref Range Status  ? SARS Coronavirus 2 by RT PCR NEGATIVE NEGATIVE Final  ?  Comment: (NOTE) ?SARS-CoV-2 target nucleic acids are NOT DETECTED. ? ?The SARS-CoV-2 RNA is generally detectable in upper respiratory ?specimens during the acute phase of infection. The lowest ?concentration of SARS-CoV-2 viral copies this assay can detect is ?138 copies/mL. A negative result does not preclude SARS-Cov-2 ?infection and should not be used as the sole basis for treatment or ?other patient management decisions. A negative result may occur with  ?improper specimen collection/handling, submission of specimen other ?than nasopharyngeal swab, presence of viral mutation(s) within the ?areas targeted by this assay, and inadequate number of viral ?copies(<138 copies/mL). A negative result must be combined with ?clinical observations, patient history, and epidemiological ?information. The expected result is Negative. ? ?Fact Sheet for Patients:  ?EntrepreneurPulse.com.au ? ?Fact Sheet for Healthcare Providers:  ?IncredibleEmployment.be ? ?This test is no t yet approved or cleared by the [place] FDA and  ?has been authorized for detection and/or diagnosis of SARS-CoV-2 by ?FDA under an Emergency Use Authorization (EUA). This EUA will remain  ?in effect (meaning this test can be used) for the duration of the ?COVID-19 declaration under Section 564(b)(1) of the Act, 21 ?U.S.C.section  360bbb-3(b)(1), unless the authorization is terminated  ?or revoked sooner.  ? ? ?  ? Influenza A by PCR NEGATIVE NEGATIVE Final  ? Influenza B by PCR NEGATIVE NEGATIVE Final  ?  Comment: (NOTE) ?The Xpert Xpress SARS-CoV-2/FLU/RSV plus assay is intended as an aid ?in the diagnosis of influenza from Nasopharyngeal swab specimens and ?should not be used as a sole basis for  treatment. Nasal washings and ?aspirates are unacceptable for Xpert Xpress SARS-CoV-2/FLU/RSV ?testing. ? ?Fact Sheet for Patients: ?EntrepreneurPulse.com.au ? ?Fact Sheet for Healthcare Provi

## 2021-11-11 NOTE — Evaluation (Signed)
Occupational Therapy Evaluation ?Patient Details ?Name: Eric Mayer ?MRN: [id number] ?DOB: 01/17/1941 ?Today's Date: 11/11/2021 ? ? ?History of Present Illness Patient is a 81 year old male s/p hemicolectomy due to rectal bleeding and colon polyp unresectable by colonscopy. PMH: A-fib, chronic CHF, L LE weakness with hx falls, patella fractures  ? ?Clinical Impression ?  ?Patient lives at [place] with spouse. At baseline uses electric scooter for community mobility and rollator for household ambulation. Did not need assist with ADLs at baseline. Evaluation limited 2* low blood pressure semi-supine in bed. BP taken in both arms L arm 81/45, R arm 83/48 with patient reporting mild dizziness at rest. Encourage patient to perform ankle pumps and upper extremity "punch the ceiling" and notified RN of blood pressures. Will re-attempt mobility once medically stable. Currently unsure of D/C plan due to limited evaluation, will update as able.   ?   ? ?Recommendations for follow up therapy are one component of a multi-disciplinary discharge planning process, led by the attending physician.  Recommendations may be updated based on patient status, additional functional criteria and insurance authorization.  ? ?Follow Up Recommendations ? Home health OT (vs SNF pending progress with mobility)  ?  ?Assistance Recommended at Discharge Frequent or constant Supervision/Assistance  ?Patient can return home with the following A lot of help with walking and/or transfers;A lot of help with bathing/dressing/bathroom;Assistance with cooking/housework;Assist for transportation;Help with stairs or ramp for entrance ? ?  ?Functional Status Assessment ? Patient has had a recent decline in their functional status and demonstrates the ability to make significant improvements in function in a reasonable and predictable amount of time.  ?Equipment Recommendations ? None recommended by OT  ?  ?   ?Precautions /  Restrictions Precautions ?Precautions: Fall ?Precaution Comments: Patient reports chronic L LE weakness 2* injury in 90s ?Restrictions ?Weight Bearing Restrictions: No  ? ?  ? ?Mobility Bed Mobility ?  ?  ?  ?  ?  ?  ?  ?General bed mobility comments: deferred 2* low BP ?  ? ? ? ?  ?   ? ?ADL either performed or assessed with clinical judgement  ? ?ADL Overall ADL's : Needs assistance/impaired ?Eating/Feeding: Independent;Bed level ?  ?Grooming: Set up;Bed level ?  ?Upper Body Bathing: Set up;Bed level ?  ?Lower Body Bathing: Maximal assistance;Bed level ?  ?Upper Body Dressing : Set up;Bed level ?  ?Lower Body Dressing: Maximal assistance;Bed level ?  ?  ?Toilet Transfer Details (indicate cue type and reason): Deferred 2* low BP ?Toileting- Clothing Manipulation and Hygiene: Total assistance;Bed level ?  ?  ?  ?  ?General ADL Comments: Evaluation limited 2* low BP at semi-supine level with patient reporting mild dizziness at rest. RN made aware  ? ? ? ? ?Pertinent Vitals/Pain Pain Assessment ?Pain Assessment: No/denies pain  ? ? ? ?Hand Dominance Right ?  ?Extremity/Trunk Assessment Upper Extremity Assessment ?Upper Extremity Assessment: Overall WFL for tasks assessed ?  ?Lower Extremity Assessment ?Lower Extremity Assessment: Defer to PT evaluation ?  ?  ?  ?Communication Communication ?Communication: Other (comment) (low phonation) ?  ?Cognition Arousal/Alertness: Awake/alert ?Behavior During Therapy: [facility] for tasks assessed/performed ?Overall Cognitive Status: Within Functional Limits for tasks assessed ?  ?  ?  ?  ?  ?  ?  ?  ?  ?  ?  ?  ?  ?  ?  ?  ?  ?  ?  ?   ?   ?   ? ? ?  Home Living Family/patient expects to be discharged to:: Private residence ?Living Arrangements: Spouse/significant other ?Available Help at Discharge: Family;Friend(s) ?Type of Home: House ?Home Access: Level entry ?  ?  ?Home Layout: One level ?  ?  ?Bathroom Shower/Tub: Walk-in shower ?  ?Bathroom Toilet: Handicapped height ?  ?  ?Home  Equipment: Wheelchair - power;Rollator (4 wheels) ?  ?Additional Comments: Lives at [facility] ?  ? ?  ?Prior Functioning/Environment Prior Level of Function : Independent/Modified Independent ?  ?  ?  ?  ?  ?  ?Mobility Comments: Could ambulate household distances with rollator, used power scooter for community distance ?  ?  ? ?  ?  ?OT Problem List: Decreased activity tolerance;Obesity ?  ?   ?OT Treatment/Interventions: Self-care/ADL training;DME and/or AE instruction;Therapeutic activities;Patient/family education;Balance training  ?  ?OT Goals(Current goals can be found in the care plan section) Acute Rehab OT Goals ?Patient Stated Goal: Feel better ?OT Goal Formulation: With patient ?Time For Goal Achievement: 11/25/21 ?Potential to Achieve Goals: Good  ?OT Frequency: Min 2X/week ?  ? ?   ?AM-PAC OT "6 Clicks" Daily Activity     ?Outcome Measure Help from another person eating meals?: None ?Help from another person taking care of personal grooming?: A Little ?Help from another person toileting, which includes using toliet, bedpan, or urinal?: Total ?Help from another person bathing (including washing, rinsing, drying)?: A Lot ?Help from another person to put on and taking off regular upper body clothing?: A Little ?Help from another person to put on and taking off regular lower body clothing?: A Lot ?6 Click Score: 15 ?  ?End of Session Nurse Communication: Other (comment) (low BP) ? ?Activity Tolerance: Treatment limited secondary to medical complications (Comment) (low BP) ?Patient left: in bed;with call bell/phone within reach;with family/visitor present ? ?OT Visit Diagnosis: Other abnormalities of gait and mobility (R26.89)  ?              ?Time: 1037-1100 ?OT Time Calculation (min): 23 min ?Charges:  OT General Charges ?$OT Visit: 1 Visit ?OT Evaluation ?$OT Eval Low Complexity: 1 Low ? ?Delbert Phenix OT ?OT pager: [phone] ? ?Rosemary Holms ?11/11/2021, 12:05 PM ?

## 2021-11-11 NOTE — Progress Notes (Signed)
Pt had no urine output since foley removal at 0600, BP 70s/40s. Bolus given, came up to 80s/40s. Pt reports feeling dizzy with any movement, and drowsy. Gross notified and spoke over phone at 1420, see new bolus and foley order.  ? ?Pt notified this RN that he was experiencing hallucinations. Stated he is seeing objects in his bed and people in his room that are not there. Pt also has not had any UOP since foley placement. Pt also had a complaint of finding it hard to breathe. See flow sheet for vitals, O2 at 2L placed with pt at 95%. MD paged, this RN spoke with Donnie Mesa to notify. MD states to continue albumin infusion, no new orders. Pt resting comfortably now.  ?

## 2021-11-11 NOTE — TOC Initial Note (Signed)
Transition of Care (TOC) - Initial/Assessment Note  ? ? ?Patient Details  ?Name: Eric Mayer ?MRN: [id number] ?Date of Birth: 30-Jun-1941 ? ?Transition of Care (TOC) CM/SW Contact:    ?Tawanna Cooler, RN ?Phone Number: ?11/11/2021, 3:19 PM ? ?Clinical Narrative:                 ? ?Attempted to speak with patient, but he has been very dizzy, lethargic.  Wife was in the room, but she was sleeping, did not wake her up.  Per notes, patient and wife live at [facility].  He has an Transport planner for getting around the community, but is able to use a rollator inside.  Has longstanding mobility issues.  Has not been able to really work with PT due to his hypotension and dizziness, so could possibly need HH or a SNF.   ?TOC CM following for discharge needs. ? ? ?Expected Discharge Plan: [name] ?Barriers to Discharge: Continued Medical Work up ? ?Expected Discharge Plan and Services ?Expected Discharge Plan: [name] ?  ?   ?Living arrangements for the past 2 months: Eric Mayer ?                ?  ?Prior Living Arrangements/Services ?Living arrangements for the past 2 months: Eric Mayer ?Lives with:: Spouse ?Patient language and need for interpreter reviewed:: Yes ?Do you feel safe going back to the place where you live?: Yes      ?Need for Family Participation in Patient Care: Yes (Comment) ?Care giver support system in place?: Yes (comment) ?  ?Criminal Activity/Legal Involvement Pertinent to Current Situation/Hospitalization: No - Comment as needed ? ?Activities of Daily Living ?Home Assistive Devices/Equipment: None ?ADL Screening (condition at time of admission) ?Patient's cognitive ability adequate to safely complete daily activities?: Yes ?Is the patient deaf or have difficulty hearing?: No ?Does the patient have difficulty seeing, even when wearing glasses/contacts?: No ?Does the patient have difficulty concentrating, remembering, or making  decisions?: No ?Patient able to express need for assistance with ADLs?: Yes ?Does the patient have difficulty dressing or bathing?: No ?Independently performs ADLs?: Yes (appropriate for developmental age) ?Does the patient have difficulty walking or climbing stairs?: No ?Weakness of Legs: None ?Weakness of Arms/Hands: None ? ?Emotional Assessment ?Appearance:: Appears stated age ?Attitude/Demeanor/Rapport: Lethargic ?Affect (typically observed): Appropriate ?Orientation: : Oriented to Self, Oriented to Place, Oriented to  Time, Oriented to Situation ?Alcohol / Substance Use: Not Applicable ?Psych Involvement: No (comment) ? ?Admission diagnosis:  Mass of colon [K63.89] ?Patient Active Problem List  ? Diagnosis Date Noted  ? Mass of colon 10/30/2021  ? Acute on chronic lower GI bleeding 11/04/2021  ? Mass of cecum 11/02/2021  ? BRBPR (bright red blood per rectum) 11/02/2021  ? Ankylosing spondylitis ([place]) 10/20/2021  ? Benign prostatic hyperplasia 10/20/2021  ? Chronic gastritis 10/20/2021  ? Constipation 10/20/2021  ? Gallstones 10/20/2021  ? 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 Chronic atrial fibrillation ([place]) 10/20/2021  ? Polyp of colon 10/20/2021  ? Steatosis of liver 10/20/2021  ? 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 Pernicious anemia 10/20/2021  ? Personal history of colonic polyps 10/20/2021  ? Iron deficiency anemia 10/20/2021  ? 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 Sepsis due to gram-negative urinary tract infection ([place]) 08/31/2020  ? Sepsis due to gram-negative UTI ([place]) 08/30/2020  ? History of malignant neoplasm of prostate 08/30/2020  ?  Complicated UTI (urinary tract infection) 08/30/2020  ? Chronic kidney disease, stage 3a ([place]) 08/30/2020  ? Hyperkalemia 08/30/2020  ? Elevated troponin level not due myocardial infarction 08/30/2020  ? Lactic acidosis 08/30/2020  ? Complete heart block ([place]) 06/03/2020  ?  Pacemaker 06/03/2020  ? Atrial fibrillation with rapid ventricular response ([place]) 02/20/2020  ? Persistent atrial fibrillation ([place]) 06/08/2018  ? Allergic rhinitis 10/15/2016  ? Intrinsic asthma 09/16/2016  ? Chronic diastolic CHF (congestive heart failure) ([place]) 07/21/2016  ? PVC's (premature ventricular contractions) 11/21/2015  ? Chronic anticoagulation 08/04/2015  ? Essential hypertension 08/04/2015  ? Obesity 08/04/2015  ? Sleep apnea-on C-Pap 08/04/2015  ? Acute on chronic diastolic heart failure ([place]) 07/31/2015  ? Primary osteoarthritis of right knee 12/17/2014  ? Malignant neoplasm of prostate ([place]) 08/20/2014  ? ?PCP:  [name], MD ?Pharmacy:   ?[facility] - [place], [place] - [address] ?[address] ?[place] [address] ?Phone: [phone] Fax: [phone] ? ?Readmission Risk Interventions ? ?  11/11/2021  ?  3:14 PM  ?Readmission Risk Prevention Plan  ?Transportation Screening Complete  ?PCP or Specialist Appt within 3-5 Days Complete  ?[place] or Home Care Consult Complete  ?Social Work Consult for [place] Planning/Counseling Complete  ?Palliative Care Screening Complete  ?Medication Review Press photographer) Complete  ? ? ? ?

## 2021-11-11 NOTE — Progress Notes (Signed)
Concern with no voiding since Foley removed and blood pressure rather soft. ?Replace Foley for risk of urinary tension and help follow.  He had decent urine output the last 12 argues and his creatinine only mildly elevated already against any severe renal failure/dehydration. ? ?In general it is better from a cardiopulmonary standpoint as well as a colorectal standpoint to keep him on the dry side. ? ?I ordered an extra bolus of crystalloid.  Also added a bolus of albumin as well. ? ?Recheck of hemoglobin is stable reassuring.  We already received IV iron today for iron deficiency anemia. ? ?We will continue to hold his usual blood pressure medicines and follow.  He does have some congestive heart failure.  We will check BMP and labs in the morning.  If concerning, may involve medicine or cardiology to help manage.  We will see. ?

## 2021-11-11 NOTE — Progress Notes (Signed)
Pt sat up on the side of the bed. Assisted pt to get up. pt stood up in a few seconds but pt complained of dizziness.  ? ?Pt got back in bed. Will monitor pt. ? ? ?

## 2021-11-11 NOTE — Progress Notes (Signed)
PT Cancellation Note ? ?Patient Details ?Name: Eric Mayer ?MRN: [id number] ?DOB: 07-24-1941 ? ? ?Cancelled Treatment:    Reason Eval/Treat Not Completed: Medical issues which prohibited therapy ? ?Pt with low BP and symptomatic in supine (lightheaded, dizzy).  Pt currently receiving another bolus of fluid and RN reports to receive albumin later.  Will hold PT today as pt not stable for mobility.   ? ?Abran Richard, PT ?Acute Rehab Services ?Pager [phone] ?[facility] [phone] ? ?Mikael Spray Raffaele Derise ?11/11/2021, 4:53 PM ?

## 2021-11-11 NOTE — Progress Notes (Addendum)
Pt refused cpap tonight stating, "it gave me a headache last night".  Pt was advised that RT is available all night should he change his mind. ?

## 2021-11-11 NOTE — Anesthesia Postprocedure Evaluation (Signed)
Anesthesia Post Note ? ?Patient: Eric Mayer ? ?Procedure(s) Performed: ROBOTIC COLECTOMY PROXIMAL HEMICOLECTOMY, PROBABLE REVISION OF ILEOCOLONIC ANASTOMISIS ?XI ROBOTIC ASSISTED LAPAROSCOPIC CHOLECYSTECTOMY with Firefly ? ?  ? ?Patient location during evaluation: PACU ?Anesthesia Type: General ?Level of consciousness: awake and alert and oriented ?Pain management: pain level controlled ?Vital Signs Assessment: post-procedure vital signs reviewed and stable ?Respiratory status: spontaneous breathing, nonlabored ventilation, respiratory function stable and patient connected to nasal cannula oxygen ?Cardiovascular status: blood pressure returned to baseline and stable ?Postop Assessment: no apparent nausea or vomiting ?Anesthetic complications: no ? ? ?No notable events documented. ? ?  ?  ?  ?  ?  ?  ? ?Noel Henandez A. ? ? ? ? ?

## 2021-11-12 LAB — CBC
HCT: 29.5 % — ABNORMAL LOW (ref 39.0–52.0)
Hemoglobin: 9 g/dL — ABNORMAL LOW (ref 13.0–17.0)
MCH: 28.1 pg (ref 26.0–34.0)
MCHC: 30.5 g/dL (ref 30.0–36.0)
MCV: 92.2 fL (ref 80.0–100.0)
Platelets: 121 10*3/uL — ABNORMAL LOW (ref 150–400)
RBC: 3.2 MIL/uL — ABNORMAL LOW (ref 4.22–5.81)
RDW: 15.7 % — ABNORMAL HIGH (ref 11.5–15.5)
WBC: 10.9 10*3/uL — ABNORMAL HIGH (ref 4.0–10.5)
nRBC: 0 % (ref 0.0–0.2)

## 2021-11-12 LAB — MAGNESIUM: Magnesium: 2 mg/dL (ref 1.7–2.4)

## 2021-11-12 LAB — COMPREHENSIVE METABOLIC PANEL
ALT: 19 U/L (ref 0–44)
AST: 24 U/L (ref 15–41)
Albumin: 3 g/dL — ABNORMAL LOW (ref 3.5–5.0)
Alkaline Phosphatase: 58 U/L (ref 38–126)
Anion gap: 7 (ref 5–15)
BUN: 28 mg/dL — ABNORMAL HIGH (ref 8–23)
CO2: 28 mmol/L (ref 22–32)
Calcium: 8.8 mg/dL — ABNORMAL LOW (ref 8.9–10.3)
Chloride: 97 mmol/L — ABNORMAL LOW (ref 98–111)
Creatinine, Ser: 2.14 mg/dL — ABNORMAL HIGH (ref 0.61–1.24)
GFR, Estimated: 31 mL/min — ABNORMAL LOW (ref >60)
Glucose, Bld: 107 mg/dL — ABNORMAL HIGH (ref 70–99)
Potassium: 3.9 mmol/L (ref 3.5–5.1)
Sodium: 132 mmol/L — ABNORMAL LOW (ref 135–145)
Total Bilirubin: 0.3 mg/dL (ref 0.3–1.2)
Total Protein: 4.9 g/dL — ABNORMAL LOW (ref 6.5–8.1)

## 2021-11-12 LAB — BRAIN NATRIURETIC PEPTIDE: B Natriuretic Peptide: 653 pg/mL — ABNORMAL HIGH (ref 0.0–100.0)

## 2021-11-12 LAB — PREALBUMIN: Prealbumin: 5.4 mg/dL — ABNORMAL LOW (ref 18–38)

## 2021-11-12 LAB — PHOSPHORUS: Phosphorus: 4.6 mg/dL (ref 2.5–4.6)

## 2021-11-12 MED ORDER — APIXABAN 5 MG PO TABS
5.0000 mg | ORAL_TABLET | Freq: Two times a day (BID) | ORAL | Status: DC
  Administered 2021-11-12 – 2021-11-13 (×2): 5 mg via ORAL
  Filled 2021-11-12 (×2): qty 1

## 2021-11-12 MED ORDER — DEXTROSE 5 % IV SOLN
1000.0000 mg | Freq: Four times a day (QID) | INTRAVENOUS | Status: DC | PRN
Start: 2021-11-12 — End: 2021-11-15
  Filled 2021-11-12: qty 10

## 2021-11-12 MED ORDER — HYDROCODONE-ACETAMINOPHEN 5-325 MG PO TABS
1.0000 | ORAL_TABLET | ORAL | Status: DC | PRN
  Administered 2021-11-13 (×2): 1 via ORAL
  Filled 2021-11-12 (×2): qty 1

## 2021-11-12 MED ORDER — ACETAMINOPHEN 325 MG PO TABS
650.0000 mg | ORAL_TABLET | Freq: Four times a day (QID) | ORAL | Status: DC | PRN
Start: 2021-11-12 — End: 2021-11-14

## 2021-11-12 MED ORDER — LACTATED RINGERS IV BOLUS
1000.0000 mL | Freq: Once | INTRAVENOUS | Status: AC
Start: 2021-11-12 — End: 2021-11-12
  Administered 2021-11-12: 1000 mL via INTRAVENOUS

## 2021-11-12 MED ORDER — FENTANYL CITRATE PF 50 MCG/ML IJ SOSY: 12.5000 ug | PREFILLED_SYRINGE | INTRAMUSCULAR | Status: DC | PRN

## 2021-11-12 MED ORDER — ACETAMINOPHEN 500 MG PO TABS
500.0000 mg | ORAL_TABLET | Freq: Four times a day (QID) | ORAL | Status: DC
  Administered 2021-11-12 – 2021-11-13 (×5): 500 mg via ORAL
  Filled 2021-11-12 (×4): qty 1

## 2021-11-12 NOTE — TOC Progression Note (Signed)
Transition of Care (TOC) - Progression Note  ? ? ?Patient Details  ?Name: ADOLPH CLUTTER ?MRN: [id number] ?Date of Birth: [birth year] ? ?Transition of Care (TOC) CM/SW Contact  ?Dessa Phi, RN ?Phone Number: ?11/12/2021, 9:28 AM ? ?Clinical Narrative: Spoke to spouse Vickii Chafe about d/c plans-from [facility],has rollator,scooter-If can return back to [facility] that would be the preferred since they have all levels of care. Await PT recc.   ? ? ? ?Expected Discharge Plan: [place] ?Barriers to Discharge: Continued Medical Work up ? ?Expected Discharge Plan and Services ?Expected Discharge Plan: [place] ?  ?  ?  ?Living arrangements for the past 2 months: [place] ?                ?  ?  ?  ?  ?  ?  ?  ?  ?  ?  ? ? ?Social Determinants of Health (SDOH) Interventions ?  ? ?Readmission Risk Interventions ? ?  11/11/2021  ?  3:14 PM  ?Readmission Risk Prevention Plan  ?Transportation Screening Complete  ?PCP or Specialist Appt within 3-5 Days Complete  ?[place] or Home Care Consult Complete  ?Social Work Consult for [place] Planning/Counseling Complete  ?Palliative Care Screening Complete  ?Medication Review Press photographer) Complete  ? ? ?

## 2021-11-12 NOTE — Progress Notes (Signed)
Occupational Therapy Treatment ?Patient Details ?Name: SIMUEL STEBNER ?MRN: [id number] ?DOB: 21-Oct-1940 ?Today's Date: 11/12/2021 ? ? ?History of present illness Patient is a 81 year old male s/p hemicolectomy due to rectal bleeding and colon polyp unresectable by colonscopy. PMH: A-fib, chronic CHF, L LE weakness with hx falls, patella fractures ?  ?OT comments ? Patient with continued orthostatic hypotension this session. In standing patient 77/45 and reports dizziness, needing to sit back down once blood pressure reading complete. Patient was able to tolerate standing again with mod +2 to side step to head of bed. Made RN aware and patient in chair position in bed at end of session.   ? ?Recommendations for follow up therapy are one component of a multi-disciplinary discharge planning process, led by the attending physician.  Recommendations may be updated based on patient status, additional functional criteria and insurance authorization. ?   ?Follow Up Recommendations ? Skilled nursing-short term rehab (<3 hours/day)  ?  ?Assistance Recommended at Discharge Frequent or constant Supervision/Assistance  ?Patient can return home with the following ? A lot of help with walking and/or transfers;A lot of help with bathing/dressing/bathroom;Assistance with cooking/housework;Assist for transportation;Help with stairs or ramp for entrance ?  ?Equipment Recommendations ? None recommended by OT  ?  ?   ?Precautions / Restrictions Precautions ?Precautions: Fall ?Precaution Comments: Patient reports chronic L LE weakness 2* cancer in 90s, orthostatic hypotension ?Restrictions ?Weight Bearing Restrictions: No  ? ? ?  ? ?Mobility Bed Mobility ?Overal bed mobility: Needs Assistance ?Bed Mobility: Supine to Sit, Sit to Supine ?  ?  ?Supine to sit: Mod assist, HOB elevated ?Sit to supine: Mod assist, +2 for physical assistance, +2 for safety/equipment ?  ?General bed mobility comments: Patient needing assistance to upright trunk  to sitting and with L LE management to edge of bed. Patient needing assist to lift legs back onto bed and guide trunk to semi-supine ?  ? ? ?  ?  ?Balance Overall balance assessment: Needs assistance ?Sitting-balance support: Feet supported ?Sitting balance-Leahy Scale: Fair ?  ?  ?Standing balance support: Reliant on assistive device for balance ?Standing balance-Leahy Scale: Poor ?  ?  ?  ?  ?  ?  ?  ?  ?  ?  ?  ?  ?   ? ?ADL either performed or assessed with clinical judgement  ? ?ADL Overall ADL's : Needs assistance/impaired ?  ?  ?  ?  ?  ?  ?  ?  ?  ?  ?Lower Body Dressing: Total assistance;Bed level ?Lower Body Dressing Details (indicate cue type and reason): To don socks ?Toilet Transfer: Moderate assistance;+2 for physical assistance;+2 for safety/equipment;Rolling walker (2 wheels) ?Toilet Transfer Details (indicate cue type and reason): Bed height elevated, patient needing +2 assist to power up to standing from edge of bed. Positive orthostatic hypotension in standing therefore did not transfer to recliner chair. On second stand patient was able to take few side steps to head of bed ?  ?  ?  ?  ?Functional mobility during ADLs: Moderate assistance;+2 for physical assistance;+2 for safety/equipment;Rolling walker (2 wheels);Cueing for safety ?General ADL Comments: Session continues to be limited 2* orthostatic hypotension with RN made aware. Was able to stand twice at edge of bed today ?  ? ? ? ?Cognition Arousal/Alertness: Awake/alert ?Behavior During Therapy: [facility] for tasks assessed/performed ?Overall Cognitive Status: Within Functional Limits for tasks assessed ?  ?  ?  ?  ?  ?  ?  ?  ?  ?  ?  ?  ?  ?  ?  ?  ?  ?  ?  ?   ?   ?   ?  General Comments Please see vitals taken ~845  ? ? ?Pertinent Vitals/ Pain       Pain Assessment ?Pain Assessment: Faces ?Faces Pain Scale: Hurts little more ?Pain Location: L flank ?Pain Descriptors / Indicators: Grimacing, Sore ?Pain Intervention(s): Monitored during  session ? ?   ?   ? ?Frequency ? Min 2X/week  ? ? ? ? ?  ?Progress Toward Goals ? ?OT Goals(current goals can now be found in the care plan section) ? Progress towards OT goals: Progressing toward goals ? ?Acute Rehab OT Goals ?Patient Stated Goal: Feel better ?OT Goal Formulation: With patient ?Time For Goal Achievement: 11/25/21 ?Potential to Achieve Goals: Good ?ADL Goals ?Pt Will Perform Lower Body Dressing: with min guard assist;sit to/from stand;sitting/lateral leans ?Pt Will Transfer to Toilet: with min guard assist;ambulating;bedside commode (walker) ?Pt Will Perform Toileting - Clothing Manipulation and hygiene: with min guard assist;sit to/from stand;sitting/lateral leans ?Additional ADL Goal #1: Patient will tolerate 5 minutes standing balance in order to participate in self care tasks.  ?Plan Discharge plan needs to be updated   ? ?Co-evaluation ? ? ? PT/OT/SLP Co-Evaluation/Treatment: Yes ?Reason for Co-Treatment: To address functional/ADL transfers ?PT goals addressed during session: Mobility/safety with mobility ?OT goals addressed during session: ADL's and self-care ?  ? ?  ?AM-PAC OT "6 Clicks" Daily Activity     ?Outcome Measure ? ? Help from another person eating meals?: None ?Help from another person taking care of personal grooming?: A Little ?Help from another person toileting, which includes using toliet, bedpan, or urinal?: Total ?Help from another person bathing (including washing, rinsing, drying)?: A Lot ?Help from another person to put on and taking off regular upper body clothing?: A Little ?Help from another person to put on and taking off regular lower body clothing?: Total ?6 Click Score: 14 ? ?  ?End of Session Equipment Utilized During Treatment: Rolling walker (2 wheels) ? ?OT Visit Diagnosis: Other abnormalities of gait and mobility (R26.89) ?  ?Activity Tolerance Patient tolerated treatment well;Treatment limited secondary to medical complications (Comment) (Orthostatic  hypotension) ?  ?Patient Left in bed;with call bell/phone within reach ?  ?Nurse Communication Mobility status;Other (comment) (orthostatic hypotension) ?  ? ?   ? ?Time: 8882-8003 ?OT Time Calculation (min): 30 min ? ?Charges: OT General Charges ?$OT Visit: 1 Visit ?OT Treatments ?$Self Care/Home Management : 8-22 mins ? ?Delbert Phenix OT ?OT pager: [phone] ? ? ?Rosemary Holms ?11/12/2021, 11:14 AM ?

## 2021-11-12 NOTE — Progress Notes (Signed)
Physical Therapy Evaluation ?Patient Details ?Name: Eric Mayer ?MRN: [id number] ?DOB: May 28, 1941 ?Today's Date: 11/12/2021 ? ?History of Present Illness ? Patient is a 81 year old male s/p hemicolectomy due to rectal bleeding and colon polyp unresectable by colonscopy. PMH: A-fib, chronic CHF, L LE weakness with hx falls, patella fractures ? ?  ?Clinical Impression ? Eric Mayer is 81 y.o. male admitted with above HPI and diagnosis. Patient is currently limited by functional impairments below (see PT problem list). Patient lives with spouse at [facility] and is typically independent to mobilize with rollator at baseline around apartment. He reports using electric scoot for community mobility. Patient will benefit from continued skilled PT interventions to address impairments and progress independence with mobility, recommending ST rehab at SNF prior to returning home with assist from spouse and [facility] services for ongoing follow up. Acute PT will follow and progress as able.    ?   ? ?Recommendations for follow up therapy are one component of a multi-disciplinary discharge planning process, led by the attending physician.  Recommendations may be updated based on patient status, additional functional criteria and insurance authorization. ? ? ?PT Recommendation   ?Follow Up Recommendations Skilled nursing-short term rehab (<3 hours/day) Filed 11/12/2021 0900  ?Assistance recommended at discharge Frequent or constant Supervision/Assistance Filed 11/12/2021 0900  ?Functional Status Assessment Patient has had a recent decline in their functional status and demonstrates the ability to make significant improvements in function in a reasonable and predictable amount of time. Filed 11/12/2021 0900  ?PT equipment None recommended by PT Filed 11/12/2021 0900  ? ? 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 ?  ? ?Mobility ? Bed Mobility ?Overal bed mobility: Needs Assistance ?Bed Mobility: Supine to Sit, Sit to Supine ?  ?  ?Supine to sit: Mod assist, +2 for safety/equipment, HOB elevated ?Sit to supine: +2 for physical assistance, HOB elevated, +2 for safety/equipment, Mod assist ?  ?General bed mobility comments: (P) Pt required cues for use of bed rail and Mod assist to bring Lt LE off EOB and raise trunk. Mod+2 required to control lowering trunk and bringing Bil LE's back onto bed at EOS. ?  ? ?Transfers ?Overall transfer level: Needs assistance ?Equipment used: Rolling walker (2 wheels) ?Transfers: Sit to/from Stand ?Sit to Stand: From elevated surface, +2 safety/equipment, +2 physical assistance, Min assist, Mod assist ?  ?  ?  ?  ?  ?General transfer comment: (P) +2 Min-Mod assist to power up from EOB to RW. pt completed 2x and required greater assist on second stand due to fatigue. pt took small side steps after second stand to move towards EOB, assist provided to move walker latearlly with pt. ?  ? ?Ambulation/Gait ?  ?  ?  ?  ?  ?  ?  ?  ? ?Stairs ?  ?  ?  ?  ?  ? ?Wheelchair Mobility ?  ? ?Modified Rankin (Stroke Patients Only) ?  ? ?  ? ?Balance Overall balance assessment: Needs assistance ?Sitting-balance support: Feet supported ?Sitting balance-Leahy Scale: Good ?  ?  ?Standing balance support: Bilateral upper extremity supported, Reliant on assistive device for balance ?Standing balance-Leahy Scale: Poor ?  ?  ?  ?  ?  ?  ?  ?  ?  ?  ?  ?  ?   ? ? ? ?Pertinent Vitals/Pain Pain  Assessment ?Pain Assessment: Faces ?Faces Pain Scale: Hurts little more ?Pain Location: Lt flank pain ?Pain Descriptors / Indicators: Discomfort, Grimacing, Guarding ?Pain Intervention(s): Limited activity within patient's tolerance, Monitored during session  ? ? ? ? ? 11/12/21 0900  ?Home Living  ?Family/patient expects to be discharged to: Private residence  ?Living  Arrangements Spouse/significant other  ?Available Help at Discharge Family;Friend(s)  ?Type of Home Apartment  ?Home Access Level entry  ?Home Layout One level  ?Bathroom Shower/Tub Walk-in shower  ?Bathroom Toilet Handicapped height  ?Bathroom Accessibility Yes  ?Home Equipment Wheelchair - power;Rollator (4 wheels);Electric scooter;Shower seat  ?Additional Comments Lives at [facility]  ?Prior Function  ?Prior Level of Function  Independent/Modified Independent  ?Mobility Comments Could ambulate household distances with rollator, used power scooter for community distance  ? ? ?Hand Dominance  ? ? 11/12/21 0900  ?Upper Extremity Assessment  ?Upper Extremity Assessment Defer to OT evaluation  ?Lower Extremity Assessment  ?Lower Extremity Assessment Generalized weakness;LLE deficits/detail  ?LLE Deficits / Details pt reports "leg is dead". 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 He can wiggle his toes but strength is grossly limited to 2/5 and he requires assist to initiate large isolated movements of Lt LE.  ?LLE Sensation decreased light touch ?(decreased sensation since cancer surgery in 90's)  ? ?   ? ?  ?   ?Communication  ?    ?Cognition Arousal/Alertness: Awake/alert ?Behavior During Therapy: [facility] for tasks assessed/performed ?Overall Cognitive Status: Within Functional Limits for tasks assessed ?  ?  ?  ?  ?  ?  ?  ?  ?  ?  ?  ?  ?  ?  ?  ?  ?  ?  ?  ? ?  ?General Comments General comments (skin integrity, edema, etc.): Please see vitals taken ~845 ? ?  ?Exercises    ? ? ? 11/12/21 0900  ?PT - End of Session  ?Equipment Utilized During Treatment Gait belt;Oxygen  ?Activity Tolerance Patient tolerated treatment well  ?Patient left in bed;with call bell/phone within reach;with bed alarm set ?(chair position)  ?Nurse Communication Mobility status  ?PT Assessment  ?PT Recommendation/Assessment Patient needs continued PT services  ?PT Visit  Diagnosis Muscle weakness (generalized) (M62.81);Difficulty in walking, not elsewhere classified (R26.2);Unsteadiness on feet (R26.81)  ?PT Problem List Decreased strength;Decreased activity tolerance;Decreased balance;Decreased mobility;Decreased knowledge of use of DME;Decreased knowledge of precautions;Pain;Obesity  ?PT Plan  ?PT Frequency (ACUTE ONLY) 7X/week  ?PT Treatment/Interventions (ACUTE ONLY) DME instruction;Gait training;Stair training;Functional mobility training;Therapeutic activities;Therapeutic exercise;Balance training;Patient/family education  ?AM-PAC PT "6 Clicks" Mobility Outcome Measure (Version 2)  ?Help needed turning from your back to your side while in a flat bed without using bedrails? 2  ?Help needed moving from lying on your back to sitting on the side of a flat bed without using bedrails? 2  ?Help needed moving to and from a bed to a chair (including a wheelchair)? 2  ?Help needed standing up from a chair using your arms (e.g., wheelchair or bedside chair)? 2  ?Help needed to walk in hospital room? 2  ?Help needed climbing 3-5 steps with a railing?  1  ?6 Click Score 11  ?Consider Recommendation of Discharge To: CIR/SNF/LTACH  ?Progressive Mobility  ?What is the highest level of mobility based on the progressive mobility assessment? Level 3 (Stands with assist) - Balance while standing  and cannot march in place  ?Activity Stood at bedside  ?  PT Recommendation  ?Follow Up Recommendations Skilled nursing-short term rehab (<3 hours/day)  ?Assistance recommended at discharge Frequent or constant Supervision/Assistance  ?Functional Status Assessment Patient has had a recent decline in their functional status and demonstrates the ability to make significant improvements in function in a reasonable and predictable amount of time.  ?PT equipment None recommended by PT  ?Individuals Consulted  ?Consulted and Agree with Results and Recommendations Patient  ? ? ? ?Co-evaluation   ?Reason for  Co-Treatment: To address functional/ADL transfers ?PT goals addressed during session: Mobility/safety with mobility ?OT goals addressed during session: ADL's and self-care ?  ? ? ?  ? ?  ?PT Start Time (ACUTE ONLY) [phone]  ?PT

## 2021-11-12 NOTE — Progress Notes (Addendum)
? Eric Mayer ?[id number] ?05-29-1941 ? ?CARE TEAM: ? ?PCP: [name], MD ? ?Outpatient Care Team: Patient Care Team: ?[name], MD as PCP - General (Internal Medicine) ?[name], MD as PCP - Cardiology (Cardiology) ?[name], [name], MD as PCP - Advanced Heart Failure (Cardiology) ?[name], NP as Nurse Practitioner (Nurse Practitioner) ?[name], MD as Consulting Physician (Gastroenterology) ?[name], MD as Consulting Physician (General Surgery) ?[name], MD as Consulting Physician (Cardiology) ? ?Inpatient Treatment Team: Treatment Team: Attending Provider: [name], MD; Registered Nurse: Corinna Lines, RN; Technician: Samara Snide, NT; Occupational Therapist: Rosemary Holms, OT; Charge Nurse: Kai Levins, RN; Physical Therapist: Jacques Navy, PT; Technician: Jeralyn Ruths, NT ? ? ?Problem List:  ? ?Principal Problem: ?  Mass of colon ? ? ?2 Days Post-Op  10/22/2021 ? ? ?POST-OPERATIVE DIAGNOSIS:   ?COLON POLYP UNRESECTABLE BY COLONOSCOPY WITH GI BLEEDING & IRON DEFICIENCY ANEMIA, HISTORY OF PRIOR JEJUNO-COLONIC INTESTINAL BYPASS ?GALLSTONES WITH CHRONIC CHOLECYSTITIS ?  ?PROCEDURE:   ?-ROBOTIC PROXIMAL EXTENDED HEMICOLECTOMY ?-ROBOTIC RESECTION OF JEJUNO-COLONIC ANASTOMOSIS (EN BLOC TAKEDOWN OF OLD INTESTINAL BYPASS) ?-ROBOTIC CHOLECYSTECTOMY ?-SEROSAL REPAIR ?-INTRAOPERATIVE ASSESSMENT OF TISSUE VASCULAR PERFUSION USING ICG (indocyanine green) IMMUNOFLUORESCENCE ?-TRANSVERSUS ABDOMINIS PLANE (TAP) BLOCK - BILATERAL ?  ?SURGEON:  Adin Hector, MD ? ?OR FINDINGS:  ?  ?Patient had very dense old adhesions of omentum, small intestine, colon to the anterior abdominal wall consistent with his prior open surgery. ? ?He had a side-to-side loop proximal jejunum to mid/distal transverse colon internal anastomosis.  This anastomosis was resected en bloc with the proximal colon to avoid multiple anastomoses. ? ?Patient had a bulky  spherical mass in the cecum at area of suspicion consistent with bleeding polyp. ? ?Patient has a side-to-side jejuno-jejunostomy anastomosis where the former loop jejunal: Ostomy anastomosis was taken down.  Patient has an ileocolonic anastomosis (terminal ileum to mid/distal transverse colon)that rests in the left epigastric region  ? ? ? ?Assessment ? ?Recovering ? ?(Hospital Stay = 2 days) ? ?Plan: ?-ERAS enhance recovery pathway ? ?-Patient with concerns of hallucinations.  Sounds like he was falling asleep and intermittently daydreaming.  Not an issue right now.  Have changed his narcotics around.  He has no significant anemia hypoxia.  He is completely oriented with no focal deficits.  Should resolve itself with mobility and better activity.  If worsening or persistent, will ask internal medicine to help follow. ? ?-Palliate sore throat.  Magic mouthwash QID x 3 d - better.  Hoarseness should improve ? ?-Dysphagia 1/full liquid diet for now.  Advance to solid diet once has bowel movements.  FiberCon bowel regimen. ? ?-Elevated creatinine in the setting of chronic kidney disease but nonoliguric.  Should resolve.  Creatinine above 2 but should stabilize.  If does not improve or worsens we will have medicine get involved.  Otherwise continue to keep on the dry side with as needed boluses.  BNP elevated but he appears to be chronically that way.  Try and hold off on diuretics for now and follow.  Follow placed for better urinary monitoring just in case.  follow creatinine and urine output.  Try and keep on the dry side but low threshold for IV fluid boluses for backup ? ?-Follow-up on pathology. ? ?-VTE prophylaxis- SCDs, etc ? ?Hgb stable - restart Eliquis & follow ? ?-mobilize as tolerated to help recovery.  Limited with his significant kyphoscoliosis.  Having therapies consult as I suspect at the very least  he will need home health and possibly skilled nursing facility with rehab capacity.  We will  see ? ?Disposition:  ?Disposition:  ?The patient is from: Home ? ?Anticipate discharge to:  [facility] (SNF) ? ?Anticipated Date of Discharge is:  April 3,2023 ?  ? ?Barriers to discharge:  Pending Clinical improvement (more likely than not) ? ?Patient currently is NOT MEDICALLY STABLE for discharge from the hospital from a surgery standpoint. ? ? ? ? ? ?I reviewed nursing notes, last 24 h vitals and pain scores, last 48 h intake and output, last 24 h labs and trends, and last 24 h imaging results. I have reviewed this patient's available data, including medical history, events of note, test results, etc as part of my evaluation.  A significant portion of that time was spent in counseling.  Care during the described time interval was provided by me. ? ?This care required moderate level of medical decision making.  11/12/2021 ? ? ? ?Subjective: ?(Chief complaint) ? ?Felt like he was waiting dreaming and somewhat hallucinating.  Got better and no issues now. ? ?Completely oriented. ? ?Denies any nausea or vomiting.  Wants to try some solid food. ? ?Still feels tired and wants to try and get up again. ? ?Objective: ? ?Vital signs: ? ?Vitals:  ? 11/11/21 1920 11/11/21 2315 11/12/21 0151 11/12/21 0500  ?BP: (!) 100/47 (!) 102/54 (!) 106/52 (!) 114/52  ?Pulse: 70 73 79 70  ?Resp: '16 16 16 16  '$ ?Temp: 97.8 ?F (36.6 ?C) 97.7 ?F (36.5 ?C) 98.4 ?F (36.9 ?C) 98 ?F (36.7 ?C)  ?TempSrc: Oral Oral Oral Oral  ?SpO2: 97% 97% 96% 97%  ?Weight:    106 kg  ?Height:      ? ? ?Last BM Date : 11/12/2021 ? ?Intake/Output  ? ?Yesterday: ? 03/29 0701 - 03/30 0700 ?In: 1816.7 [P.O.:810; I.V.:3; IV Piggyback:1003.7] ?Out: 250 [Urine:250] ?This shift: ? No intake/output data recorded. ? ?Bowel function: ? Flatus: YES ? BM:  No ? Drain: (No drain) ? ? ?Physical Exam: ? ?General: Pt awake/alert in no acute distress.  Calm and interactive.  Does not appear confused or delirious or tangential. ?Eyes: PERRL, normal EOM.  Sclera clear.  No  icterus ?Neuro: CN II-XII intact w/o focal sensory/motor deficits. ?Lymph: No head/neck/groin lymphadenopathy ?Psych:  No delerium/psychosis/paranoia.  Oriented x 4 ?HENT: Normocephalic, Mucus membranes moist.  No thrush.  Much less hoarseness & can speak normally. ?Neck: Supple, No tracheal deviation.  No obvious thyromegaly ?Chest: No pain to chest wall compression.  Good respiratory excursion.  No audible wheezing ?CV:  Pulses intact.  Regular rhythm.  No major extremity edema ?MS: Significant kyphoscoliosis which is his baseline.  Normal AROM mjr joints.  No obvious deformity ? ?Abdomen: Soft.  Mildy distended.  Nontender.  No evidence of peritonitis.  No incarcerated hernias. ?GU: Foley back in place with light yellow-colored urine. ?Ext:   No deformity.  No mjr edema.  No cyanosis ?Skin: No petechiae / purpurea.  No major sores.  Warm and dry ? ? ? ?Results:  ? ?Cultures: ?Recent Results (from the past 720 hour(s))  ?Resp Panel by RT-PCR (Flu A&B, Covid) Nasopharyngeal Swab     Status: None  ? Collection Time: 11/02/21  4:07 PM  ? Specimen: Nasopharyngeal Swab; Nasopharyngeal(NP) swabs in vial transport medium  ?Result Value Ref Range Status  ? SARS Coronavirus 2 by RT PCR NEGATIVE NEGATIVE Final  ?  Comment: (NOTE) ?SARS-CoV-2 target nucleic acids are NOT DETECTED. ? ?  The SARS-CoV-2 RNA is generally detectable in upper respiratory ?specimens during the acute phase of infection. The lowest ?concentration of SARS-CoV-2 viral copies this assay can detect is ?138 copies/mL. A negative result does not preclude SARS-Cov-2 ?infection and should not be used as the sole basis for treatment or ?other patient management decisions. A negative result may occur with  ?improper specimen collection/handling, submission of specimen other ?than nasopharyngeal swab, presence of viral mutation(s) within the ?areas targeted by this assay, and inadequate number of viral ?copies(<138 copies/mL). A negative result must be combined  with ?clinical observations, patient history, and epidemiological ?information. The expected result is Negative. ? ?Fact Sheet for Patients:  ?EntrepreneurPulse.com.au ? ?Fact Sheet for Healthcare Provi

## 2021-11-12 NOTE — Progress Notes (Signed)
Pt refused cpap tonight. RT advised he could call if he changed his mind. ?

## 2021-11-13 ENCOUNTER — Inpatient Hospital Stay (HOSPITAL_COMMUNITY): Payer: Medicare Other

## 2021-11-13 DIAGNOSIS — N179 Acute kidney failure, unspecified: Secondary | ICD-10-CM

## 2021-11-13 DIAGNOSIS — I9589 Other hypotension: Secondary | ICD-10-CM

## 2021-11-13 DIAGNOSIS — I5033 Acute on chronic diastolic (congestive) heart failure: Secondary | ICD-10-CM

## 2021-11-13 DIAGNOSIS — F419 Anxiety disorder, unspecified: Secondary | ICD-10-CM

## 2021-11-13 DIAGNOSIS — J9601 Acute respiratory failure with hypoxia: Secondary | ICD-10-CM

## 2021-11-13 DIAGNOSIS — E861 Hypovolemia: Secondary | ICD-10-CM

## 2021-11-13 LAB — CBC
HCT: 32.5 % — ABNORMAL LOW (ref 39.0–52.0)
HCT: 34.7 % — ABNORMAL LOW (ref 39.0–52.0)
Hemoglobin: 10.2 g/dL — ABNORMAL LOW (ref 13.0–17.0)
Hemoglobin: 11.1 g/dL — ABNORMAL LOW (ref 13.0–17.0)
MCH: 28.3 pg (ref 26.0–34.0)
MCH: 28.2 pg (ref 26.0–34.0)
MCHC: 31.4 g/dL (ref 30.0–36.0)
MCHC: 32 g/dL (ref 30.0–36.0)
MCV: 90.3 fL (ref 80.0–100.0)
MCV: 88.1 fL (ref 80.0–100.0)
Platelets: 155 10*3/uL (ref 150–400)
Platelets: 196 10*3/uL (ref 150–400)
RBC: 3.6 MIL/uL — ABNORMAL LOW (ref 4.22–5.81)
RBC: 3.94 MIL/uL — ABNORMAL LOW (ref 4.22–5.81)
RDW: 15.7 % — ABNORMAL HIGH (ref 11.5–15.5)
RDW: 15.4 % (ref 11.5–15.5)
WBC: 4.8 10*3/uL (ref 4.0–10.5)
WBC: 6 10*3/uL (ref 4.0–10.5)
nRBC: 0 % (ref 0.0–0.2)
nRBC: 0 % (ref 0.0–0.2)

## 2021-11-13 LAB — BLOOD GAS, ARTERIAL
Acid-Base Excess: 1.7 mmol/L (ref 0.0–2.0)
Acid-base deficit: 3.6 mmol/L — ABNORMAL HIGH (ref 0.0–2.0)
Bicarbonate: 25.8 mmol/L (ref 20.0–28.0)
Bicarbonate: 21.5 mmol/L (ref 20.0–28.0)
O2 Content: 6 L/min
O2 Saturation: 98.1 %
O2 Saturation: 98.1 %
Patient temperature: 37
Patient temperature: 37
pCO2 arterial: 38 mmHg (ref 32–48)
pCO2 arterial: 38 mmHg (ref 32–48)
pH, Arterial: 7.44 (ref 7.35–7.45)
pH, Arterial: 7.36 (ref 7.35–7.45)
pO2, Arterial: 124 mmHg — ABNORMAL HIGH (ref 83–108)
pO2, Arterial: 131 mmHg — ABNORMAL HIGH (ref 83–108)

## 2021-11-13 LAB — ECHOCARDIOGRAM COMPLETE
AR max vel: 4.53 cm2
AV Area VTI: 3.86 cm2
AV Area mean vel: 3.48 cm2
AV Mean grad: 15 mmHg
AV Peak grad: 27.7 mmHg
Ao pk vel: 2.63 m/s
Area-P 1/2: 2.48 cm2
Calc EF: 56 %
Height: 70 in
P 1/2 time: 718 msec
S' Lateral: 2.3 cm
Single Plane A2C EF: 50.4 %
Single Plane A4C EF: 64.3 %
Weight: 3834.24 oz

## 2021-11-13 LAB — BASIC METABOLIC PANEL
Anion gap: 8 (ref 5–15)
BUN: 36 mg/dL — ABNORMAL HIGH (ref 8–23)
CO2: 29 mmol/L (ref 22–32)
Calcium: 9.1 mg/dL (ref 8.9–10.3)
Chloride: 95 mmol/L — ABNORMAL LOW (ref 98–111)
Creatinine, Ser: 2.08 mg/dL — ABNORMAL HIGH (ref 0.61–1.24)
GFR, Estimated: 32 mL/min — ABNORMAL LOW (ref >60)
Glucose, Bld: 128 mg/dL — ABNORMAL HIGH (ref 70–99)
Potassium: 3.9 mmol/L (ref 3.5–5.1)
Sodium: 132 mmol/L — ABNORMAL LOW (ref 135–145)

## 2021-11-13 LAB — GLUCOSE, CAPILLARY
Glucose-Capillary: 118 mg/dL — ABNORMAL HIGH (ref 70–99)
Glucose-Capillary: 114 mg/dL — ABNORMAL HIGH (ref 70–99)

## 2021-11-13 LAB — PROCALCITONIN: Procalcitonin: 2.14 ng/mL

## 2021-11-13 LAB — SURGICAL PATHOLOGY

## 2021-11-13 MED ORDER — FUROSEMIDE 10 MG/ML IJ SOLN: 40.0000 mg | Freq: Two times a day (BID) | INTRAMUSCULAR | Status: DC

## 2021-11-13 MED ORDER — FUROSEMIDE 10 MG/ML IJ SOLN
20.0000 mg | Freq: Once | INTRAMUSCULAR | Status: AC
  Administered 2021-11-13: 20 mg via INTRAVENOUS

## 2021-11-13 MED ORDER — CHLORHEXIDINE GLUCONATE CLOTH 2 % EX PADS
6.0000 | MEDICATED_PAD | Freq: Every day | CUTANEOUS | Status: DC
  Administered 2021-11-13: 6 via TOPICAL

## 2021-11-13 MED ORDER — LORAZEPAM 2 MG/ML IJ SOLN
0.5000 mg | Freq: Four times a day (QID) | INTRAMUSCULAR | Status: DC | PRN
  Administered 2021-11-13: 0.5 mg via INTRAVENOUS
  Filled 2021-11-13 (×2): qty 1

## 2021-11-13 MED ORDER — FUROSEMIDE 10 MG/ML IJ SOLN
20.0000 mg | Freq: Two times a day (BID) | INTRAMUSCULAR | Status: DC
  Filled 2021-11-13: qty 2

## 2021-11-13 MED ORDER — SODIUM CHLORIDE 0.9 % IV SOLN
250.0000 mL | INTRAVENOUS | Status: DC
  Administered 2021-11-13: 250 mL via INTRAVENOUS

## 2021-11-13 MED ORDER — FUROSEMIDE 10 MG/ML IJ SOLN: 80.0000 mg | Freq: Two times a day (BID) | INTRAMUSCULAR | Status: DC

## 2021-11-13 MED ORDER — SODIUM CHLORIDE 0.9 % IV SOLN
2.0000 g | Freq: Two times a day (BID) | INTRAVENOUS | Status: DC
  Administered 2021-11-13 – 2021-11-14 (×2): 2 g via INTRAVENOUS
  Filled 2021-11-13 (×3): qty 2

## 2021-11-13 MED ORDER — NOREPINEPHRINE 4 MG/250ML-% IV SOLN
2.0000 ug/min | INTRAVENOUS | Status: DC
  Administered 2021-11-13 (×2): 2 ug/min via INTRAVENOUS
  Administered 2021-11-14: 8 ug/min via INTRAVENOUS
  Filled 2021-11-13 (×2): qty 250

## 2021-11-13 MED ORDER — VANCOMYCIN HCL 1500 MG/300ML IV SOLN: 1500.0000 mg | INTRAVENOUS | Status: DC

## 2021-11-13 MED ORDER — POLYETHYLENE GLYCOL 3350 17 G PO PACK
17.0000 g | PACK | Freq: Two times a day (BID) | ORAL | Status: DC
Start: 2021-11-13 — End: 2021-11-14
  Administered 2021-11-13: 17 g via ORAL
  Filled 2021-11-13: qty 1

## 2021-11-13 MED ORDER — ONDANSETRON HCL 4 MG/2ML IJ SOLN
4.0000 mg | Freq: Four times a day (QID) | INTRAMUSCULAR | Status: DC
  Administered 2021-11-13 – 2021-11-14 (×2): 4 mg via INTRAVENOUS
  Filled 2021-11-13 (×2): qty 2

## 2021-11-13 MED ORDER — VANCOMYCIN HCL 2000 MG/400ML IV SOLN
2000.0000 mg | Freq: Once | INTRAVENOUS | Status: AC
  Administered 2021-11-13: 2000 mg via INTRAVENOUS
  Filled 2021-11-13 (×2): qty 400

## 2021-11-13 NOTE — Progress Notes (Signed)
PT Cancellation Note ? ?Patient Details ?Name: Eric Mayer ?MRN: [id number] ?DOB: [birth year] ? ? ?Cancelled Treatment:    Reason Eval/Treat Not Completed: Medical issues which prohibited therapy; noted transfer to ICU with BP and respiratory issues.  Will attempt again another day. ? ? ?Reginia Naas ?11/13/2021, 3:22 PM ?Magda Kiel, PT ?Acute Rehabilitation Services ?KDXIP:[phone] ?[phone] ?11/13/2021 ? ?

## 2021-11-13 NOTE — Significant Event (Addendum)
Rapid Response Event Note  ? ?Reason for Call :  ?Increased work of breathing and hypotension ? ?Initial Focused Assessment:  ?Patient sitting up in bed resting. Per bedside RN patient hypotensive with BP in low 90s. Per chart review patient has history of heart failure and has elevated BNP 653. ? ?Dr Marylyn Ishihara paged-Patient repositioned in bed by RRT and bedside RN-patient did become more short of breath. Per patient, slightly better once repositioned and sat back up in bed. Lasix 20 mg ordered by Marylyn Ishihara MD. Chest x ray already ordered from this AM- showed pneumonia vs pulmonary edema.  ? ?Neuro- Alert and oriented x4. Grip equal and able to move all extremities. Patient speaking in short sentences-having to pause in between words.  ?Cardiac- Patient V-paced, rate sustaining 70s. BP 100s-110s.  ?Respiratory- 5L [place]. SpO2 99%, but patient having increased work of breathing and RR noted to be in upper 20s to low 30s. Lungs diminished bilaterally to ascultation.  ? ?Interventions:  ?20 mg Lasix, ABG per Marylyn Ishihara MD. Orders placed to [facility] to SDU and initiate BIPAP.  ? ?Plan of Care:  ?Transfer to SD for BIPAP.  ? ?Event Summary:  ? ?MD Notified: 1005-Kyle MD. Melba.Dryer Gross MD ?Call Time: 2633 ?Arrival Time: [phone] ?End Time: 6256 ? ?Josph Macho, RN ?

## 2021-11-13 NOTE — Progress Notes (Signed)
? Eric Mayer ?[id number] ?Mar 07, 1941 ? ?CARE TEAM: ? ?PCP: [name], MD ? ?Outpatient Care Team: Patient Care Team: ?[name], MD as PCP - General (Internal Medicine) ?[name], MD as PCP - Cardiology (Cardiology) ?[name], [name], MD as PCP - Advanced Heart Failure (Cardiology) ?[name], NP as Nurse Practitioner (Nurse Practitioner) ?[name], MD as Consulting Physician (Gastroenterology) ?[name], MD as Consulting Physician (General Surgery) ?[name], MD as Consulting Physician (Cardiology) ? ?Inpatient Treatment Team: Treatment Team: Attending Provider: [name], MD; Registered Nurse: Orbie Pyo, RN; Registered Nurse: Aura Dials, RN; Technician: Karna Christmas, [place]; Physical Therapist: Max Sane, PT; Consulting Physician: Jonnie Finner, DO ? ? ?Problem List:  ? ?Principal Problem: ?  Mass of colon ? ? ?3 Days Post-Op  11/06/2021 ? ? ?POST-OPERATIVE DIAGNOSIS:   ?COLON POLYP UNRESECTABLE BY COLONOSCOPY WITH GI BLEEDING & IRON DEFICIENCY ANEMIA, HISTORY OF PRIOR JEJUNO-COLONIC INTESTINAL BYPASS ?GALLSTONES WITH CHRONIC CHOLECYSTITIS ?  ?PROCEDURE:   ?-ROBOTIC PROXIMAL EXTENDED HEMICOLECTOMY ?-ROBOTIC RESECTION OF JEJUNO-COLONIC ANASTOMOSIS (EN BLOC TAKEDOWN OF OLD INTESTINAL BYPASS) ?-ROBOTIC CHOLECYSTECTOMY ?-SEROSAL REPAIR ?-INTRAOPERATIVE ASSESSMENT OF TISSUE VASCULAR PERFUSION USING ICG (indocyanine green) IMMUNOFLUORESCENCE ?-TRANSVERSUS ABDOMINIS PLANE (TAP) BLOCK - BILATERAL ?  ?SURGEON:  Adin Hector, MD ? ?OR FINDINGS:  ?  ?Patient had very dense old adhesions of omentum, small intestine, colon to the anterior abdominal wall consistent with his prior open surgery. ? ?He had a side-to-side loop proximal jejunum to mid/distal transverse colon internal anastomosis.  This anastomosis was resected en bloc with the proximal colon to avoid multiple anastomoses. ? ?Patient had a bulky spherical mass in the cecum at area of suspicion  consistent with bleeding polyp. ? ?Patient has a side-to-side jejuno-jejunostomy anastomosis where the former loop jejunal: Ostomy anastomosis was taken down.  Patient has an ileocolonic anastomosis (terminal ileum to mid/distal transverse colon)that rests in the left epigastric region  ? ? ? ?Assessment ? ?Struggling ? ?(Hospital Stay = 3 days) ? ?Plan: ?-ERAS enhance recovery pathway ? ?-Patient stated he went to go home today.  I strongly disagree and noted that it is not my medical advice.  His wife is in the room and agrees ? ?-Patient with concerns of hallucinations persisting.  He seems alert and oriented right now.  We did adjust his pain medications.  Seem to have tolerated hydrocodone in the past.  We will hold off on palliative care but need need their input.  Giving time for Lexapro to work. He has no significant anemia hypoxia.  He is completely oriented with no focal deficits.  Should resolve itself with mobility and better activity.  If worsening or persistent, will ask internal medicine to help follow. ? ?Patient feeling more short of breath.  Does have a history of heart failure.  Check echocardiogram.  I will asked the medicine hospital service to help follow.  I suspect he could benefit from diuretics but want to make sure it safe since he did have elevated creatinine.  Creatinine is stabilized and he is nonoliguric and he still fluid overloaded, so perhaps a test dose but I want to run by them first. ? ?-Sore throat issues have resolved.   ? ?-Chronic constipation.  Usually would have diarrhea with his very proximal bypass but that has not been an issue for many years.  We will switch to MiraLAX and see if we can gently keep things moving.  Try a soft diet for more p.o. options ? ?-Elevated  creatinine in the setting of chronic kidney disease but nonoliguric.  And seems to have plateaued at 2.  Follow closely.  May benefit from a Lasix/Bumex challenge but want to run it by medicine first should  resolve.  Creatinine above 2 but should stabilize.  If does not improve or worsens we will have medicine get involved.  Otherwise continue to keep on the dry side with as needed boluses.  BNP elevated but he appears to be chronically that way.  Try and hold off on diuretics for now and follow.  Follow placed for better urinary monitoring just in case.  follow creatinine and urine output.  Try and keep on the dry side but low threshold for IV fluid boluses for backup ? ?-Follow-up on pathology. ? ?-VTE prophylaxis- SCDs, etc ? ?Hgb stable - restarted Eliquis 3/30 - follow ? ?-mobilize as tolerated to help recovery.  Limited with his significant kyphoscoliosis.  He is frustrated that he is not independent but does concede that he was walking with a walker at home.  Still 2-3 person assist.  I encouraged him to keep trying to work with therapies to help out.  Having therapies consult as I suspect at the very least he will need home health and possibly skilled nursing facility with rehab capacity.  We will see ? ?I spent a while explaining my concerns and plans.  Allowed him a chance to vent his struggles.  He is somewhat tired and perseverated.  Somewhat reassured.  I tried to convince him I am finding for him and he definitely agrees with that.  I tried to frame expectations.  We knew he was very deconditioned and this would be a struggle to recover.  I am guardedly optimistic since his blood pressure is more stable, his creatinine is stabilized, he is not having mental decline, his hypoxia is mild.  However, I would continue aggressive multidisciplinary help with nursing, therapies, medicine, myself, social work, Social research officer, government.  Patient relented and needing help.  Wife appreciative of the discussion. ? ?Disposition:  ?Disposition:  ?The patient is from: Home ? ?Anticipate discharge to:  [facility] (SNF) ? ?Anticipated Date of Discharge is:  April 3,2023 ?  ? ?Barriers to discharge:  Pending Clinical improvement  (more likely than not) ? ?Patient currently is NOT MEDICALLY STABLE for discharge from the hospital from a surgery standpoint. ? ? ? ? ? ?I reviewed nursing notes, last 24 h vitals and pain scores, last 48 h intake and output, last 24 h labs and trends, and last 24 h imaging results. I have reviewed this patient's available data, including medical history, events of note, test results, etc as part of my evaluation.  A significant portion of that time was spent in counseling.  Care during the described time interval was provided by me. ? ?This care required moderate level of medical decision making.  11/13/2021 ? ? ? ?Subjective: ?(Chief complaint) ? ?Patient frustrated and wants to go home. ? ?Complains he is not sleeping well. ? ?Two-person assist to get up to chair last night.  Very deconditioned.  Embarrassed disappointed in himself and frustrated. ? ?No nausea or vomiting. ? ?Still struggling with soreness. ? ?Objective: ? ?Vital signs: ? ?Vitals:  ? 11/12/21 2126 11/13/21 0230 11/13/21 0438 11/13/21 0600  ?BP: (!) 130/52 (!) 152/62  109/66  ?Pulse: 74 80  70  ?Resp: '16 18  18  '$ ?Temp: (!) 97.4 ?F (36.3 ?C) (!) 97.4 ?F (36.3 ?C)  98 ?F (36.7 ?C)  ?  TempSrc: Oral Oral  Oral  ?SpO2: 99% 98%  99%  ?Weight:   108.7 kg   ?Height:      ? ? ?Last BM Date : 11/07/21 ? ?Intake/Output  ? ?Yesterday: ? 03/30 0701 - 03/31 0700 ?In: 540 [P.O.:540] ?Out: 1100 [Urine:1100] ?This shift: ? No intake/output data recorded. ? ?Bowel function: ? Flatus: YES ? BM:  No ? Drain: (No drain) ? ? ?Physical Exam: ? ?General: Pt awake/alert in no acute distress.  Calm and interactive.  Does not appear confused or delirious or tangential. ?Eyes: PERRL, normal EOM.  Sclera clear.  No icterus ?Neuro: CN II-XII intact w/o focal sensory/motor deficits. ?Lymph: No head/neck/groin lymphadenopathy ?Psych:  No delerium/psychosis/paranoia.  Oriented x 4 ?HENT: Normocephalic, Mucus membranes moist.  No thrush.  Much less hoarseness & can speak  normally. ?Neck: Supple, No tracheal deviation.  No obvious thyromegaly ?Chest: No pain to chest wall compression.  Good respiratory excursion.  No audible wheezing ?CV:  Pulses intact.  Regular rhythm.  No major ex

## 2021-11-13 NOTE — Consult Note (Signed)
? ?NAME:  TRASE BUNDA, MRN:  [id number], DOB:  [birth year], LOS: 3 ?ADMISSION DATE:  11/05/2021, CONSULTATION DATE:  11/13/2021 ? ?REFERRING MD:  Marylyn Ishihara, TRH, CHIEF COMPLAINT: Respiratory distress and hypotension ? ?History of Present Illness:  ?81 year old who underwent elective right hemicolectomy on 3/28 for bleeding cecal polyp.  He developed respiratory distress and hospitalist were consulted, diuretics was restarted with Lasix and Bumex, PCCM consulted for hypotension ?Chest x-ray showing bilateral pleural effusions, he appears to be +4.4 L with urine output of 1.4 L in the last 24 hours.  Started on empiric antibiotics cefepime and vancomycin ? ?Pertinent  Medical History  ?Chronic diastolic heart failure ?CKD stage III ?OSA ?Chronic dyspnea -felt to be multifactorial, restrictive lung disease, heart failure, obesity, RHC 08/2020 showed normal filling pressures but had elevated V waves and PCWP tracing ?PFTs 5/21: FEV1 1.66 (59%) FVC 2.28 (59%) DLCO 69% ?Chronic atrial fibrillation on Eliquis ?Morbid obesity status post gastric bypass ? ?Significant Hospital Events: ?Including procedures, antibiotic start and stop dates in addition to other pertinent events   ? ? ?Interim History / Subjective:  ?Complains of mild shortness of breath ?No fevers ? ?Objective   ?Blood pressure (!) 91/27, pulse 70, temperature 97.6 ?F (36.4 ?C), temperature source Oral, resp. rate (!) 40, height '5\' 10"'$  (1.778 m), weight 108.7 kg, SpO2 99 %. ?   ?   ? ?Intake/Output Summary (Last 24 hours) at 11/13/2021 1213 ?Last data filed at 11/13/2021 1000 ?Gross per 24 hour  ?Intake 480 ml  ?Output 1275 ml  ?Net -795 ml  ? ?Filed Weights  ? 10/19/2021 1032 11/12/21 0500 11/13/21 0438  ?Weight: 97.1 kg 106 kg 108.7 kg  ? ? ?Examination: ?General: Obese man, lying in bed, on 5 L nasal cannula ?HENT: Mild pallor, no icterus, no JVD ?Lungs: Decreased breath sounds bilateral ?Cardiovascular: S1-S2 distant, irregular ?Abdomen: Obese, mild tenderness at  incision site, ecchymosis left flank ?Extremities: 1+ edema ?Neuro: Alert, interactive, nonfocal, no tremors ?GU: Minimal clear urine ? ? ?Labs show gradual rise in creatinine from 1.2 baseline to 2.1, BNP 653 , slight high procalcitonin 2.1, decreased leukocytosis ? ?Chest x-ray dependently reviewed shows right basilar opacity with effusion ?Resolved Hospital Problem list   ? ? ?Assessment & Plan:  ?Hypotension -unclear cause currently, check hemoglobin to rule out bleeding, doubt septic shock but empiric cefepime/vancomycin added for bibasal pneumonia/HAP, note high procalcitonin ?-Use Levophed if systolic blood pressure less than 90 ? ?Acute hypoxic respiratory failure -on 5 L nasal cannula ?-Doubt BiPAP necessary unless work of breathing increases significantly. ?Attributed to heart failure versus bibasilar pneumonia/effusions ?Obtaining CT chest to clarify ? ?Acute on chronic HFpEF ?Previously noted pulmonary hypertension status post RHC ? ?-Creatinine rising with diuresis , will have to hold diuresis until creatinine decreases ?-May use Levophed to enable diuresis ? ?Best Practice (right click and "Reselect all SmartList Selections" daily)  ? ?Diet/type: NPO w/ oral meds ?DVT prophylaxis: DOAC ?GI prophylaxis: PPI ?Lines: N/A ?Foley:  Yes, and it is still needed ?Code Status:  full code ?Last date of multidisciplinary goals of care discussion [NA] ? ?Labs   ?CBC: ?Recent Labs  ?Lab 11/11/21 ?3817 11/11/21 ?1449 11/12/21 ?7116 11/13/21 ?5790  ?WBC 11.3*  --  10.9* 4.8  ?HGB 9.0* 9.1* 9.0* 10.2*  ?HCT 29.8* 30.1* 29.5* 32.5*  ?MCV 92.0  --  92.2 90.3  ?PLT 133*  --  121* 155  ? ? ?Basic Metabolic Panel: ?Recent Labs  ?Lab 11/11/21 ?3833 11/12/21 ?0439 11/13/21 ?0433  ?  NA 137 132* 132*  ?K 3.9 3.9 3.9  ?CL 103 97* 95*  ?CO2 '27 28 29  '$ ?GLUCOSE 132* 107* 128*  ?BUN 27* 28* 36*  ?CREATININE 1.70* 2.14* 2.08*  ?CALCIUM 8.6* 8.8* 9.1  ?MG 2.1 2.0  --   ?PHOS  --  4.6  --   ? ?GFR: ?Estimated Creatinine Clearance: 36.1  mL/min (A) (by C-G formula based on SCr of 2.08 mg/dL (H)). ?Recent Labs  ?Lab 11/11/21 ?9458 11/12/21 ?0439 11/13/21 ?5929 11/13/21 ?2446  ?PROCALCITON  --   --  2.14  --   ?WBC 11.3* 10.9*  --  4.8  ? ? ?Liver Function Tests: ?Recent Labs  ?Lab 11/12/21 ?0439  ?AST 24  ?ALT 19  ?ALKPHOS 58  ?BILITOT 0.3  ?PROT 4.9*  ?ALBUMIN 3.0*  ? ?No results for input(s): LIPASE, AMYLASE in the last 168 hours. ?No results for input(s): AMMONIA in the last 168 hours. ? ?ABG ?   ?Component Value Date/Time  ? PHART 7.44 11/13/2021 1114  ? PCO2ART 38 11/13/2021 1114  ? PO2ART 124 (H) 11/13/2021 1114  ? HCO3 25.8 11/13/2021 1114  ? TCO2 19 (L) 09/04/2020 1008  ? ACIDBASEDEF 8.0 (H) 09/04/2020 1008  ? O2SAT 98.1 11/13/2021 1114  ?  ? ?Coagulation Profile: ?No results for input(s): INR, PROTIME in the last 168 hours. ? ?Cardiac Enzymes: ?No results for input(s): CKTOTAL, CKMB, CKMBINDEX, TROPONINI in the last 168 hours. ? ?HbA1C: ?Hgb A1c MFr Bld  ?Date/Time Value Ref Range Status  ?11/05/2021 10:10 AM 4.8 4.8 - 5.6 % Final  ?  Comment:  ?  (NOTE) ?Pre diabetes:          5.7%-6.4% ? ?Diabetes:              >6.4% ? ?Glycemic control for   <7.0% ?adults with diabetes ?  ? ? ?CBG: ?No results for input(s): GLUCAP in the last 168 hours. ? ?Review of Systems:   ?Shortness of breath ?Abdominal pain ?No coughing, wheezing or chest pain ? 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  ? ?Surgical History:  ? ?Past Surgical History:  ?Procedure Laterality Date  ? ATRIAL FIBRILLATION ABLATION N/A 06/08/2018  ? Procedure: ATRIAL FIBRILLATION ABLATION;  Surgeon: Thompson Grayer, MD;  Location: [facility];  Service: Cardiovascular;  Laterality: N/A;  ? ATRIAL FIBRILLATION ABLATION N/A 12/20/2018  ? Procedure: ATRIAL FIBRILLATION ABLATION;  Surgeon: Thompson Grayer, MD;  Location: [facility];  Service: Cardiovascular;  Laterality: N/A;  ? AV NODE ABLATION  02/20/2020  ? AV NODE ABLATION N/A 02/20/2020  ? Procedure: AV NODE ABLATION;  Surgeon: Evans Lance, MD;  Location: [place] [facility];  Service: Cardiovascular;  Laterality: N/A;  ? AV NODE ABLATION N/A 12/29/2020  ? Procedure: AV NODE ABLATION;  Surgeon: Evans Lance, MD;  Location: [facility];  Service: Cardiovascular;  Laterality: N/A;  ? BIOPSY  07/28/2021  ? Procedure: BIOPSY;  Surgeon: [name], MD;  Location: [facility] ENDOS[facility];  Service: Endoscopy;;  ? CARDIAC CATHETERIZATION  08-12-2003  dr Tressia Miners turner  ? Normal coronary arteries, normal wall motion, no sig. abnormalities  ? CARDIOVERSION N/A 08/04/2015  ? Procedure: CARDIOVERSION;  Surgeon: [name], MD;  Location: [facility];  Service: Cardiovascular;  Laterality: N/A;  ? CARDIOVERSION N/A 10/28/2017  ? Procedure: CARDIOVERSION;  Surgeon: Larey Dresser, MD;  Location: [facility]COPY;  Service: Cardiovascular;  Laterality: N/A;  ? CARDIOVERSION N/A 04/12/2018  ? Procedure: CARDIOVERSION;  Surgeon: Thayer Headings, MD;  Location: [place];  Service: Cardiovascular;  Laterality: N/A;  ? CARDIOVERSION N/A 07/26/2018  ? Procedure: CARDIOVERSION;  Surgeon: Larey Dresser, MD;  Location[facility] ENDOSCOPY;  Service: Cardiovascular;  Laterality: N/A;  ? CARDIOVERSION N/A 10/09/2018  ? Procedure: CARDIOVERSION;  Surgeon:  Sueanne Margarita, MD;  Location: [facility] ENDOSCOPY;  Service: Cardiovascular;  Laterality: N/A;  ? COLONOSCOPY  07-03-2002  ? COLONOSCOPY WITH PROPOFOL N/A 07/28/2021  ? Procedure: COLONOSCOPY WITH PROPOFOL;  Surge

## 2021-11-13 NOTE — Progress Notes (Signed)
An USGPIV (ultrasound guided PIV) has been placed  on RAFA 20g 1.25",for short-term vasopressor infusion. A correctly placed ivWatch must be used when administering Vasopressors. Should this treatment be needed beyond 72 hours, central line access should be obtained.  It will be the responsibility of the bedside nurse to follow best practice to prevent extravasations.   ?

## 2021-11-13 NOTE — Progress Notes (Signed)
Pharmacy Antibiotic Note ? ?Eric Mayer is a 81 y.o. male admitted on 10/28/2021 with pneumonia.  Pharmacy has been consulted for Vanco, Cefepime dosing. ? ?ID: HCAP. Afebrile. WBC 4.8, 2L>>up to 5L [place] ?- 3/31: CXR: Increased right basilar opacity is noted concerning for worsening ?pneumonia or atelectasis with associated pleural effusion. ? ?Cefepime 3/31>> ?Vanco 3/31>> ? ?Plan: ?Cefepime 2g IV q12 hrs ?Vanco 2g IV x 1 ?Vancomycin 1500 mg IV Q 48 hrs. Goal AUC 400-550. ?Expected AUC: 520 ?SCr used: 2.08 (changing some daily) ? ? ? ?Height: '5\' 10"'$  (177.8 cm) ?Weight: 108.7 kg (239 lb 10.2 oz) ?IBW/kg (Calculated) : 73 ? ?Temp (24hrs), Avg:97.6 ?F (36.4 ?C), Min:97.4 ?F (36.3 ?C), Max:98 ?F (36.7 ?C) ? ?Recent Labs  ?Lab 11/11/21 ?0037 11/12/21 ?0439 11/13/21 ?0433  ?WBC 11.3* 10.9* 4.8  ?CREATININE 1.70* 2.14* 2.08*  ?  ?Estimated Creatinine Clearance: 36.1 mL/min (A) (by C-G formula based on SCr of 2.08 mg/dL (H)).   ? ?Allergies  ?Allergen Reactions  ? Ace Inhibitors Cough  ? Xarelto [Rivaroxaban] Other (See Comments)  ?  Joint pain  ? ? ?Wenona Mayville S. Alford Highland, PharmD, BCPS ?Clinical Staff Pharmacist ?[email or web address] ? ?Wayland Salinas ?11/13/2021 11:22 AM ? ?

## 2021-11-13 NOTE — Progress Notes (Signed)
RT NOTE:  Pt placed on BiPAP per MD order. 

## 2021-11-13 NOTE — Consult Note (Addendum)
Initial Consultation Note ? ? ?Patient: Eric Mayer AOZ:[id number] DOB: 10/24/1940 PCP: [name], MD ?DOA: 10/22/2021 ?DOS: the patient was seen and examined on 11/13/2021 ?Primary service: [name], MD ? ?Referring physician: Dr. [name] ?Reason for consult: HF, CKD mgmt ? ?Assessment/Plan: ?Assessment and Plan: ?No notes have been filed under this hospital service. ?Service: Hospitalist ?Acute on chronic HFpEF ?    - CXR shows atelectasis vs PNA w/ some pleural effusions; exam is more c/w pulm edema than PNA; will check procal, but for now will resume equivalent of diuretic ?    - normally on bumex 2 daily; hasn't had it in a couple days; will start lasix 20 IV BID (bumex to lasix is 1 : 40; lasix PO to lasix IV is 1 : 0.5) ?    - check echo ?    - follow I&O, daily wts; watch BPs ?  ?AKI on CKD3a ?    - likely d/t a "forward flow" problem; resume diuretics, check renal US ? ?A fib ?HTN ?    - continue home regimen as BP tolerates; continue eliquis ? ?Delirium ?    - delirium precautions ?    - melatonin for sleep ? ?Anxiety ?    - continue home regimen ? ?BPH ?    - continue home regimen as BP tolerates ? ?Colon polyps s/p hemicolectomy ?    - per primary team ? ?OSA ?    - continue CPAP qHS ? ?Remainder per primary team. ? ?UPDATE: Procal up. Vanc/cefepime started. CT chest pending. Moved to SDU. Hold lasix as he is more hypotensive. Start peripheral levephed. PCCM consulted.  ? ?TRH will continue to follow the patient. ? ?HPI: Eric Mayer is a 81 y.o. male with past medical history of chronic HFpEF, CKD3a, anxiety, BPH, OSA. Presenting with bleeding cecal polyp. Admitted to the surgical service and is now s/p proximal hemicolectomy. During his stay with the team, it has been noted that the patient has become a little more confused. In addition, he has become more dyspneic. He has not has his regular diuretics since admission. It has been noted that he has an increased serum creatinine as  well. It was for these reasons that TRH was consulted. The patient denies any chest pain or palpitations. He does endorse orthopnea. He denies any other aggravating or alleviating factors.  ? ?Review of Systems: As mentioned in the history of present illness. All other systems reviewed and are negative. ?Past Medical History:  ?Diagnosis Date  ? Anemia 1980s X 1  ? Arthritis   ? "hands, knees, hips, ankles" (07/31/2015)  ? Atrial flutter ([place]) 07/31/2015  ? CHF (congestive heart failure) ([place])   ? Chronic diastolic heart failure ([place])   ? a. 11/2015: Echo w/ EF of 60-65%, no WMA, Grade 2 DD, trivial AR, ascending aorta mildly dilated.   ? Depression   ? Dyspnea   ? History of cardiovascular stress test   ? a. 03/2015: Dobutamine Stress Echo with no evidence of ischemia.   ? History of gastric bypass   ? History of peptic ulcer   ? 1980's  ? History of radiation therapy 1993  ? sarcoma of left groin, tx at [facility]  ? History of sarcoma   ? 1993  LEFT GOIN--  S/P SURGERY, RADIATION AND CHEMO IN [place]  ? Hypertension   ? Hypogonadism in male   ? Incomplete right bundle branch block   ? Nerve injury   ?  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 Nocturia   ? OSA on CPAP   ? Persistent atrial fibrillation ([place])   ?    ? 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 Gleason 7,  stage T1c  ? PVC's (premature ventricular contractions) 11/21/2015  ? 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 "of groin"  ? Weakness of left leg   ? SECONDARY TO SURGICAL NERVE INJURY OF LEFT GOIN  ? Wears glasses   ? ?Past Surgical History:  ?Procedure Laterality Date  ? ATRIAL FIBRILLATION ABLATION N/A 06/08/2018  ? Procedure: ATRIAL FIBRILLATION ABLATION;  Surgeon: Thompson Grayer, MD;  Location: [place] [facility];  Service: Cardiovascular;  Laterality: N/A;  ? ATRIAL FIBRILLATION ABLATION N/A 12/20/2018  ? Procedure: ATRIAL  FIBRILLATION ABLATION;  Surgeon: Thompson Grayer, MD;  Location: [place] [facility];  Service: Cardiovascular;  Laterality: N/A;  ? AV NODE ABLATION  02/20/2020  ? AV NODE ABLATION N/A 02/20/2020  ? Procedure: AV NODE ABLATION;  Surgeon: Evans Lance, MD;  Location: [place] [facility];  Service: Cardiovascular;  Laterality: N/A;  ? AV NODE ABLATION N/A 12/29/2020  ? Procedure: AV NODE ABLATION;  Surgeon: Evans Lance, MD;  Location: [place] [facility];  Service: Cardiovascular;  Laterality: N/A;  ? BIOPSY  07/28/2021  ? Procedure: BIOPSY;  Surgeon: [name], MD;  Location: [facility] ENDOS[facility];  Service: Endoscopy;;  ? CARDIAC CATHETERIZATION  08-12-2003  dr Tressia Miners turner  ? Normal coronary arteries, normal wall motion, no sig. abnormalities  ? CARDIOVERSION N/A 08/04/2015  ? Procedure: CARDIOVERSION;  Surgeon: [name], MD;  Location: [place];  Service: Cardiovascular;  Laterality: N/A;  ? CARDIOVERSION N/A 10/28/2017  ? Procedure: CARDIOVERSION;  Surgeon: Larey Dresser, MD;  Location: [facility] ENDOSCOPY;  Service: Cardiovascular;  Laterality: N/A;  ? CARDIOVERSION N/A 04/12/2018  ? Procedure: CARDIOVERSION;  Surgeon: Thayer Headings, MD;  Location: [place];  Service: Cardiovascular;  Laterality: N/A;  ? CARDIOVERSION N/A 07/26/2018  ? Procedure: CARDIOVERSION;  Surgeon: Larey Dresser, MD;  Location: [facility] ENDOSCOPY;  Service: Cardiovascular;  Laterality: N/A;  ? CARDIOVERSION N/A 10/09/2018  ? Procedure: CARDIOVERSION;  Surgeon: Sueanne Margarita, MD;  Location: [facility] ENDOSCOPY;  Service: Cardiovascular;  Laterality: N/A;  ? COLONOSCOPY  07-03-2002  ? COLONOSCOPY WITH PROPOFOL N/A 07/28/2021  ? Procedure: COLONOSCOPY WITH PROPOFOL;  Surgeon: [name], MD;  Location: [facility];  Service: Endoscopy;  Laterality: N/A;  ? COLONOSCOPY WITH PROPOFOL N/A 09/16/2021  ? Procedure: COLONOSCOPY WITH PROPOFOL;  Surgeon: [name], MD;  Location: [facility];  Service: Endoscopy;  Laterality: N/A;  ? [place]  ? FRACTURE SURGERY    ? [place]  ? SARCOMA SURGERY  ? INGUINAL HERNIA REPAIR Right 1950s?  ? INSERT / REPLACE / REMOVE PACEMAKER  02/20/2020  ? INTESTINAL BYPASS  1976  ? GASTRIC FOR OBESITY  ? JOINT REPLACEMENT    ? PACEMAKER IMPLANT N/A 02/20/2020  ? Procedure: PACEMAKER IMPLANT;  Surgeon: Evans Lance, MD;  Location: [place] [facility];  Service: Cardiovascular;  Laterality: N/A;  ? PATELLA FRACTURE SURGERY Left ~ 1995  ? "broke it twice; only had OR once"  ? POLYPECTOMY  07/28/2021  ? Procedure: POLYPECTOMY;  Surgeon: [name], MD;  Location: [facility];  Service: Endoscopy;;  ? POLYPECTOMY  09/16/2021  ? Procedure: POLYPECTOMY;  Surgeon: [name], MD;  Location: [facility];  Service: Endoscopy;;  ? PROSTATE BIOPSY  06/2014  ? RADIOACTIVE SEED IMPLANT N/A 10/17/2014  ? Procedure: RADIOACTIVE SEED IMPLANT    ;  Surgeon: Ailene Rud, MD;  Location: [facility];  Service: Urology;  Laterality: N/A;   68 SEEDS IMPLANTED ?  ? RIGHT HEART CATH N/A 09/04/2020  ? Procedure: RIGHT HEART CATH;  Surgeon: Jolaine Artist, MD;  Location: [place] [facility];  Service: Cardiovascular;  Laterality: N/A;  ? TEE WITHOUT CARDIOVERSION N/A 10/28/2017  ? Procedure: TRANSESOPHAGEAL ECHOCARDIOGRAM (TEE);  Surgeon: Larey Dresser, MD;  Location: [facility]COPY;  Service: Cardiovascular;  Laterality: N/A;  ? TONSILLECTOMY  1950s  ? TOTAL KNEE ARTHROPLASTY Right 12/17/2014  ? Procedure: TOTAL KNEE ARTHROPLASTY;  Surgeon: Melrose Nakayama, MD;  Location: [place];  Service: Orthopedics;  Laterality: Right;  ? TRANSTHORACIC ECHOCARDIOGRAM  08-08-2003  ? moderate LVH/  ef 55-65%/  mild MR/  moderate LAE/  trivial TR/  trivial pericardial effusion posterior to the heart  ? [place]  ? VIDEO BRONCHOSCOPY Bilateral 09/21/2016  ? Procedure: VIDEO BRONCHOSCOPY WITHOUT FLUORO;  Surgeon: Collene Gobble, MD;  Location: [place];  Service: Cardiopulmonary;   Laterality: Bilateral;  ? ?Social History:  reports that he has never smoked. He has never used smokeless tobacco. He reports that he does not drink alcohol and does not use drugs. ? ?Allergies  ?Allerg

## 2021-11-13 NOTE — Progress Notes (Signed)
RT NOTE: ? ?Pt complains of feeling like he needs to throw up, BiPAP removed.  ?

## 2021-11-13 NOTE — Progress Notes (Signed)
Called by rapid response nurse.  Concern for increasing tachypnea & need for more oxygen.. ? ?Seen by Dr. Marylyn Ishihara with the hospitalist and already started on Lasix.  I agree with that. ? ?Chest x-ray shows increased fluid.  I am very skeptical of an aspiration pneumonia.  Most likely atelectasis.  Agree with CT of chest.  Patient was already started back on his Eliquis yesterday so hopefully the likelihood of DVT/PE low.  We will see. ? ?I did order an echocardiogram.  Dr. Marylyn Ishihara was going to see if that was appropriate or not. ? ?I think would be wise to transfer the patient to stepdown unit to follow more closely.  Apparently no beds are available so it might have to be in progressive.  We will see. ?

## 2021-11-13 NOTE — Progress Notes (Addendum)
PIV consult for vasopressor: Limited vascular access options. Pt's arms are edematous and bruised. Please consider central line for prolonged IV access. ? ?An USGPIV (ultrasound guided PIV) has been placed in L anterior forearm for short-term vasopressor infusion. A correctly placed ivWatch must be used when administering Vasopressors. Should this treatment be needed beyond 72 hours, central line access should be obtained.  It will be the responsibility of the bedside nurse to follow best practice to prevent extravasations.   ?

## 2021-11-13 NOTE — Care Management Important Message (Signed)
Important Message ? ?Patient Details IM Letter given to the Patient. ?Name: Eric Mayer ?MRN: [id number] ?Date of Birth: 17-Jun-1941 ? ? ?Medicare Important Message Given:  Yes ? ? ? ? ?Kerin Salen ?11/13/2021, 12:34 PM ?

## 2021-11-14 ENCOUNTER — Inpatient Hospital Stay (HOSPITAL_COMMUNITY): Payer: Medicare Other

## 2021-11-14 DIAGNOSIS — K6389 Other specified diseases of intestine: Secondary | ICD-10-CM

## 2021-11-14 DIAGNOSIS — A419 Sepsis, unspecified organism: Secondary | ICD-10-CM

## 2021-11-14 DIAGNOSIS — R6521 Severe sepsis with septic shock: Secondary | ICD-10-CM

## 2021-11-14 DIAGNOSIS — I472 Ventricular tachycardia, unspecified: Secondary | ICD-10-CM

## 2021-11-14 LAB — BASIC METABOLIC PANEL
Anion gap: 15 (ref 5–15)
BUN: 42 mg/dL — ABNORMAL HIGH (ref 8–23)
CO2: 21 mmol/L — ABNORMAL LOW (ref 22–32)
Calcium: 9.7 mg/dL (ref 8.9–10.3)
Chloride: 96 mmol/L — ABNORMAL LOW (ref 98–111)
Creatinine, Ser: 2.61 mg/dL — ABNORMAL HIGH (ref 0.61–1.24)
GFR, Estimated: 24 mL/min — ABNORMAL LOW (ref >60)
Glucose, Bld: 101 mg/dL — ABNORMAL HIGH (ref 70–99)
Potassium: 5.1 mmol/L (ref 3.5–5.1)
Sodium: 132 mmol/L — ABNORMAL LOW (ref 135–145)

## 2021-11-14 LAB — CBC WITH DIFFERENTIAL/PLATELET
Abs Immature Granulocytes: 0.05 10*3/uL (ref 0.00–0.07)
Basophils Absolute: 0 10*3/uL (ref 0.0–0.1)
Basophils Relative: 0 %
Eosinophils Absolute: 0 10*3/uL (ref 0.0–0.5)
Eosinophils Relative: 0 %
HCT: 21.2 % — ABNORMAL LOW (ref 39.0–52.0)
Hemoglobin: 6.7 g/dL — CL (ref 13.0–17.0)
Immature Granulocytes: 1 %
Lymphocytes Relative: 17 %
Lymphs Abs: 0.8 10*3/uL (ref 0.7–4.0)
MCH: 28.5 pg (ref 26.0–34.0)
MCHC: 31.6 g/dL (ref 30.0–36.0)
MCV: 90.2 fL (ref 80.0–100.0)
Monocytes Absolute: 0.2 10*3/uL (ref 0.1–1.0)
Monocytes Relative: 4 %
Neutro Abs: 3.9 10*3/uL (ref 1.7–7.7)
Neutrophils Relative %: 78 %
Platelets: 184 10*3/uL (ref 150–400)
RBC: 2.35 MIL/uL — ABNORMAL LOW (ref 4.22–5.81)
RDW: 16.2 % — ABNORMAL HIGH (ref 11.5–15.5)
WBC: 4.9 10*3/uL (ref 4.0–10.5)
nRBC: 2.2 % — ABNORMAL HIGH (ref 0.0–0.2)

## 2021-11-14 LAB — COMPREHENSIVE METABOLIC PANEL
ALT: 25 U/L (ref 0–44)
AST: 57 U/L — ABNORMAL HIGH (ref 15–41)
Albumin: 1.7 g/dL — ABNORMAL LOW (ref 3.5–5.0)
Alkaline Phosphatase: 65 U/L (ref 38–126)
Anion gap: 11 (ref 5–15)
BUN: 47 mg/dL — ABNORMAL HIGH (ref 8–23)
CO2: 24 mmol/L (ref 22–32)
Calcium: 9.2 mg/dL (ref 8.9–10.3)
Chloride: 98 mmol/L (ref 98–111)
Creatinine, Ser: 2.98 mg/dL — ABNORMAL HIGH (ref 0.61–1.24)
GFR, Estimated: 21 mL/min — ABNORMAL LOW (ref >60)
Glucose, Bld: 176 mg/dL — ABNORMAL HIGH (ref 70–99)
Potassium: 4.5 mmol/L (ref 3.5–5.1)
Sodium: 133 mmol/L — ABNORMAL LOW (ref 135–145)
Total Bilirubin: 1.1 mg/dL (ref 0.3–1.2)
Total Protein: 3.3 g/dL — ABNORMAL LOW (ref 6.5–8.1)

## 2021-11-14 LAB — CBC
HCT: 42.5 % (ref 39.0–52.0)
Hemoglobin: 13.4 g/dL (ref 13.0–17.0)
MCH: 27.3 pg (ref 26.0–34.0)
MCHC: 31.5 g/dL (ref 30.0–36.0)
MCV: 86.7 fL (ref 80.0–100.0)
Platelets: 142 10*3/uL — ABNORMAL LOW (ref 150–400)
RBC: 4.9 MIL/uL (ref 4.22–5.81)
RDW: 15.9 % — ABNORMAL HIGH (ref 11.5–15.5)
WBC: 10.8 10*3/uL — ABNORMAL HIGH (ref 4.0–10.5)
nRBC: 0.3 % — ABNORMAL HIGH (ref 0.0–0.2)

## 2021-11-14 LAB — BLOOD GAS, ARTERIAL
Acid-base deficit: 4.3 mmol/L — ABNORMAL HIGH (ref 0.0–2.0)
Bicarbonate: 21.6 mmol/L (ref 20.0–28.0)
FIO2: 100 %
MECHVT: 580 mL
O2 Saturation: 92.2 %
PEEP: 5 cmH2O
Patient temperature: 37
RATE: 18 resp/min
pCO2 arterial: 42 mmHg (ref 32–48)
pH, Arterial: 7.32 — ABNORMAL LOW (ref 7.35–7.45)
pO2, Arterial: 64 mmHg — ABNORMAL LOW (ref 83–108)

## 2021-11-14 LAB — GLUCOSE, CAPILLARY
Glucose-Capillary: 118 mg/dL — ABNORMAL HIGH (ref 70–99)
Glucose-Capillary: 116 mg/dL — ABNORMAL HIGH (ref 70–99)
Glucose-Capillary: 102 mg/dL — ABNORMAL HIGH (ref 70–99)
Glucose-Capillary: 124 mg/dL — ABNORMAL HIGH (ref 70–99)

## 2021-11-14 LAB — APTT: aPTT: 75 seconds — ABNORMAL HIGH (ref 24–36)

## 2021-11-14 LAB — HEPARIN LEVEL (UNFRACTIONATED): Heparin Unfractionated: 0.52 IU/mL (ref 0.30–0.70)

## 2021-11-14 MED ORDER — MIDAZOLAM HCL 2 MG/2ML IJ SOLN
1.0000 mg | INTRAMUSCULAR | Status: DC | PRN
  Administered 2021-11-14: 1 mg via INTRAVENOUS
  Filled 2021-11-14: qty 2

## 2021-11-14 MED ORDER — FENTANYL CITRATE PF 50 MCG/ML IJ SOSY
25.0000 ug | PREFILLED_SYRINGE | Freq: Once | INTRAMUSCULAR | Status: AC
  Administered 2021-11-14: 25 ug via INTRAVENOUS

## 2021-11-14 MED ORDER — NOREPINEPHRINE 4 MG/250ML-% IV SOLN
0.0000 ug/min | INTRAVENOUS | Status: DC
  Filled 2021-11-14: qty 250

## 2021-11-14 MED ORDER — ROCURONIUM BROMIDE 10 MG/ML (PF) SYRINGE
PREFILLED_SYRINGE | INTRAVENOUS | Status: AC
  Filled 2021-11-14: qty 10

## 2021-11-14 MED ORDER — VASOPRESSIN 20 UNITS/100 ML INFUSION FOR SHOCK
0.0000 [IU]/min | INTRAVENOUS | Status: DC
  Administered 2021-11-14: 0.04 [IU]/min via INTRAVENOUS
  Filled 2021-11-14: qty 100

## 2021-11-14 MED ORDER — EPINEPHRINE HCL 5 MG/250ML IV SOLN IN NS
0.5000 ug/min | INTRAVENOUS | Status: DC
  Administered 2021-11-14: 0.5 ug/min via INTRAVENOUS
  Filled 2021-11-14: qty 250

## 2021-11-14 MED ORDER — MIDAZOLAM HCL 2 MG/2ML IJ SOLN: 1.0000 mg | INTRAMUSCULAR | Status: DC | PRN

## 2021-11-14 MED ORDER — ACETAMINOPHEN 325 MG PO TABS: 650.0000 mg | ORAL_TABLET | Freq: Four times a day (QID) | ORAL | Status: DC | PRN

## 2021-11-14 MED ORDER — LACTATED RINGERS IV BOLUS
1000.0000 mL | Freq: Once | INTRAVENOUS | Status: AC
  Administered 2021-11-14: 1000 mL via INTRAVENOUS

## 2021-11-14 MED ORDER — FENTANYL CITRATE (PF) 100 MCG/2ML IJ SOLN
INTRAMUSCULAR | Status: AC
  Administered 2021-11-14: 100 ug
  Filled 2021-11-14: qty 2

## 2021-11-14 MED ORDER — GLYCOPYRROLATE 0.2 MG/ML IJ SOLN: 0.2000 mg | INTRAMUSCULAR | Status: DC | PRN

## 2021-11-14 MED ORDER — SODIUM CHLORIDE 0.9 % IV SOLN: 2.0000 g | INTRAVENOUS | Status: DC

## 2021-11-14 MED ORDER — FENTANYL CITRATE PF 50 MCG/ML IJ SOSY: 25.0000 ug | PREFILLED_SYRINGE | INTRAMUSCULAR | Status: DC | PRN

## 2021-11-14 MED ORDER — FENTANYL CITRATE PF 50 MCG/ML IJ SOSY: 50.0000 ug | PREFILLED_SYRINGE | INTRAMUSCULAR | Status: DC | PRN

## 2021-11-14 MED ORDER — DIPHENHYDRAMINE HCL 50 MG/ML IJ SOLN: 25.0000 mg | INTRAMUSCULAR | Status: DC | PRN

## 2021-11-14 MED ORDER — GLYCOPYRROLATE 1 MG PO TABS: 1.0000 mg | ORAL_TABLET | ORAL | Status: DC | PRN

## 2021-11-14 MED ORDER — FENTANYL 2500MCG IN NS 250ML (10MCG/ML) PREMIX INFUSION
25.0000 ug/h | INTRAVENOUS | Status: DC
  Administered 2021-11-14: 25 ug/h via INTRAVENOUS
  Filled 2021-11-14: qty 250

## 2021-11-14 MED ORDER — AMIODARONE HCL IN DEXTROSE 360-4.14 MG/200ML-% IV SOLN
60.0000 mg/h | INTRAVENOUS | Status: DC
  Administered 2021-11-14: 60 mg/h via INTRAVENOUS

## 2021-11-14 MED ORDER — ETOMIDATE 2 MG/ML IV SOLN
INTRAVENOUS | Status: AC
Start: 2021-11-14 — End: 2021-11-14
  Administered 2021-11-14: 20 mg
  Filled 2021-11-14: qty 20

## 2021-11-14 MED ORDER — LACTATED RINGERS IV SOLN: INTRAVENOUS | Status: DC

## 2021-11-14 MED ORDER — FENTANYL 2500MCG IN NS 250ML (10MCG/ML) PREMIX INFUSION: 0.0000 ug/h | INTRAVENOUS | Status: DC

## 2021-11-14 MED ORDER — POLYVINYL ALCOHOL 1.4 % OP SOLN: 1.0000 [drp] | Freq: Four times a day (QID) | OPHTHALMIC | Status: DC | PRN

## 2021-11-14 MED ORDER — SODIUM CHLORIDE 0.9 % IV BOLUS
1000.0000 mL | Freq: Once | INTRAVENOUS | Status: AC
  Administered 2021-11-14: 1000 mL via INTRAVENOUS

## 2021-11-14 MED ORDER — AMIODARONE HCL IN DEXTROSE 360-4.14 MG/200ML-% IV SOLN: 30.0000 mg/h | INTRAVENOUS | Status: DC

## 2021-11-14 MED ORDER — HEPARIN (PORCINE) 25000 UT/250ML-% IV SOLN: 1400.0000 [IU]/h | INTRAVENOUS | Status: DC

## 2021-11-14 MED ORDER — ORAL CARE MOUTH RINSE: 15.0000 mL | Freq: Two times a day (BID) | OROMUCOSAL | Status: DC

## 2021-11-14 MED ORDER — ALBUMIN HUMAN 5 % IV SOLN: 25.0000 g | Freq: Once | INTRAVENOUS | Status: DC

## 2021-11-14 MED ORDER — PHENYLEPHRINE 40 MCG/ML (10ML) SYRINGE FOR IV PUSH (FOR BLOOD PRESSURE SUPPORT)
PREFILLED_SYRINGE | INTRAVENOUS | Status: AC
  Administered 2021-11-14: 400 ug
  Filled 2021-11-14: qty 10

## 2021-11-14 MED ORDER — FENTANYL BOLUS VIA INFUSION
25.0000 ug | INTRAVENOUS | Status: DC | PRN
  Administered 2021-11-14 (×5): 50 ug via INTRAVENOUS
  Filled 2021-11-14: qty 100

## 2021-11-14 MED ORDER — PANTOPRAZOLE SODIUM 40 MG IV SOLR: 40.0000 mg | Freq: Every day | INTRAVENOUS | Status: DC

## 2021-11-14 MED ORDER — VANCOMYCIN HCL 1250 MG/250ML IV SOLN: 1250.0000 mg | INTRAVENOUS | Status: DC

## 2021-11-14 MED ORDER — MIDAZOLAM HCL 2 MG/2ML IJ SOLN
INTRAMUSCULAR | Status: AC
  Administered 2021-11-14: 1 mg
  Filled 2021-11-14: qty 2

## 2021-11-14 MED ORDER — SODIUM CHLORIDE 0.9 % IV BOLUS: 1000.0000 mL | Freq: Once | INTRAVENOUS | Status: DC

## 2021-11-14 MED ORDER — AMIODARONE HCL IN DEXTROSE 360-4.14 MG/200ML-% IV SOLN
INTRAVENOUS | Status: AC
  Filled 2021-11-14: qty 200

## 2021-11-14 MED ORDER — NOREPINEPHRINE 4 MG/250ML-% IV SOLN
0.0000 ug/min | INTRAVENOUS | Status: DC
  Administered 2021-11-14: 40 ug/min via INTRAVENOUS
  Administered 2021-11-14: 14 ug/min via INTRAVENOUS
  Administered 2021-11-14 (×2): 40 ug/min via INTRAVENOUS
  Filled 2021-11-14 (×3): qty 250

## 2021-11-14 MED ORDER — FENTANYL CITRATE PF 50 MCG/ML IJ SOSY: 12.5000 ug | PREFILLED_SYRINGE | INTRAMUSCULAR | Status: DC | PRN

## 2021-11-14 MED ORDER — AMIODARONE LOAD VIA INFUSION
150.0000 mg | Freq: Once | INTRAVENOUS | Status: AC
  Administered 2021-11-14: 150 mg via INTRAVENOUS
  Filled 2021-11-14: qty 83.34

## 2021-11-14 MED ORDER — CHLORHEXIDINE GLUCONATE 0.12 % MT SOLN: 15.0000 mL | Freq: Two times a day (BID) | OROMUCOSAL | Status: DC

## 2021-11-14 MED ORDER — PHENYLEPHRINE 40 MCG/ML (10ML) SYRINGE FOR IV PUSH (FOR BLOOD PRESSURE SUPPORT)
PREFILLED_SYRINGE | INTRAVENOUS | Status: AC
  Filled 2021-11-14: qty 10

## 2021-11-14 MED ORDER — DEXTROSE 5 % IV SOLN: INTRAVENOUS | Status: DC

## 2021-11-14 MED ORDER — FENTANYL BOLUS VIA INFUSION
100.0000 ug | INTRAVENOUS | Status: DC | PRN
  Filled 2021-11-14: qty 100

## 2021-11-14 MED ORDER — ACETAMINOPHEN 650 MG RE SUPP: 650.0000 mg | Freq: Four times a day (QID) | RECTAL | Status: DC | PRN

## 2021-11-14 MED FILL — Medication: Qty: 1 | Status: AC

## 2021-11-14 DEATH — deceased

## 2021-12-04 ENCOUNTER — Other Ambulatory Visit (HOSPITAL_COMMUNITY): Payer: Medicare Other

## 2021-12-14 NOTE — Progress Notes (Signed)
He had recurrent V. Tach ?We used amiodarone bolus and drip and then lidocaine bolus ?Consulted surgery, not felt to be a candidate for operative intervention due to above. ?Further discussion with wife, DNR issued. ?Repeat labs show hemoglobin of 6.7. ?On maximum pressors Levophed, vasopressin and epinephrine drips ?Family decided to withdraw life support. ?Orders written, comfort care measures ? ?Additional critical care time was 20 minutes ? ?Leanna Sato Elsworth Soho MD ? ?

## 2021-12-14 NOTE — Progress Notes (Signed)
Pharmacy Antibiotic Note ? ?Eric Mayer is a 81 y.o. male admitted on 10/30/2021 with pneumonia.  Pharmacy has been consulted for Vanco, Cefepime dosing. ?2021/12/12 ?HCAP. Afebrile.Tmax 99.3;  WBC 4.8> 10.8, O2 2L>>up to 5L [place]>> BiPap ?SCr 2.08>> 2.61, CrCl ~ 29 ml/min ?- 3/31: CXR: Increased right basilar opacity is noted concerning for worsening ?pneumonia or atelectasis with associated pleural effusion. ?MRSA PCR ordered 3/31 but not sent to lab> re-order for today ? ?Plan: ?Decrease Cefepime 2g IV q24 hrs ?Decrease Vancomycin 1250 mg IV Q 48 hrs. Goal AUC 400-550. ?Expected AUC: 471.5, Vd 0.5 Wt 108.7 kg ?SCr used: 2.61 ?F/u MRSA PCR ?F/u renal function, WBC, temp ? ?Height: '5\' 10"'$  (177.8 cm) ?Weight: 108.7 kg (239 lb 10.2 oz) ?IBW/kg (Calculated) : 73 ? ?Temp (24hrs), Avg:98.2 ?F (36.8 ?C), Min:97.6 ?F (36.4 ?C), Max:99.3 ?F (37.4 ?C) ? ?Recent Labs  ?Lab 11/11/21 ?0358 11/12/21 ?0439 11/13/21 ?0433 11/13/21 ?1239 12-12-21 ?2244  ?WBC 11.3* 10.9* 4.8 6.0 10.8*  ?CREATININE 1.70* 2.14* 2.08*  --  2.61*  ? ?  ?Estimated Creatinine Clearance: 28.8 mL/min (A) (by C-G formula based on SCr of 2.61 mg/dL (H)).   ? ?Allergies  ?Allergen Reactions  ? Ace Inhibitors Cough  ? Xarelto [Rivaroxaban] Other (See Comments)  ?  Joint pain  ? ? ?Eudelia Bunch, Pharm.D ?12-Dec-2021 8:37 AM ? ?

## 2021-12-14 NOTE — Progress Notes (Signed)
Post mortem care provided;  x1 green jacket found in room - placed in belongings bag with name label - placed at nurses station;  Pt transport to bring pt to morgue.  Wife has patient placement card per day shift RN; funeral home undecided. ?

## 2021-12-14 NOTE — Progress Notes (Signed)
Technical Description:  ?- CPR performance duration:   4 mins   ?- Was defibrillation or cardioversion used? yes  ?- Was external pacer placed? No  ?- Was patient intubated pre/post CPR? Yes  ? ? ?Post CPR evaluation:  ?- Final Status - Was patient successfully resuscitated ?  yes ?- What is current rhythm?  Sinus  ?- What is current hemodynamic status?  Unstable on epi gtt, levo gtt, vaso ? ?Miscellaneous Information:  ?- Labs sent, including: CBC, CMET, INR, ABG, lactic   ?- Primary team notified?  Yes  ?- Family Notified? Yes , wife present   ?- Additional notes/ transfer status:   Refractory VT , responded to shock , but recurrent, amio 150 bolus x 2 , started on amio gtt, lido 100 x 1  ?After speaking to wife & primary team (Dr Kae Heller ) , DNR issued ? ? ?Leanna Sato Elsworth Soho MD ? ?

## 2021-12-14 NOTE — Progress Notes (Signed)
?  Reassessed patient several times today. ?Worsening hypotension requiring increased doses of pressors. ?Mental status remained poor on BiPAP. ?Intubated electively, 3 L fluid aspirated with OG tube ?Vent/sedation orders given. ?Arterial line placed, adding vasopressin and giving albumin for low CVP ?CT chest/abdomen/pelvis obtained -shows free air and ileus?  Consistent with postop findings versus acute issue.  Unfortunately contrast could not be given for CT scan. ?Discussed with radiology and general surgery who will follow  ?Hold off on restarting IV heparin meantime. ? ?Wife continuously updated to sequence of events , poor prognosis given ? ?Additional critical care time independent of procedures was 60 minutes ? ?Leanna Sato Elsworth Soho MD ?

## 2021-12-14 NOTE — Progress Notes (Signed)
ANTICOAGULATION CONSULT NOTE - Initial Consult ? ?Pharmacy Consult for heparin ?Indication: atrial fibrillation- PTA apixaban on hold ? ?Allergies  ?Allergen Reactions  ? Ace Inhibitors Cough  ? Xarelto [Rivaroxaban] Other (See Comments)  ?  Joint pain  ? ? ?Patient Measurements: ?Height: '5\' 10"'$  (177.8 cm) ?Weight: 108.7 kg (239 lb 10.2 oz) ?IBW/kg (Calculated) : 73 ?Heparin Dosing Weight: 93 ? ?Vital Signs: ?Temp: 98.2 ?F (36.8 ?C) (04/01 1245) ?Temp Source: Axillary (04/01 8099) ?BP: 116/45 (04/01 0930) ?Pulse Rate: 70 (04/01 0700) ? ?Labs: ?Recent Labs  ?  11/12/21 ?0439 11/13/21 ?0433 11/13/21 ?1239 Dec 05, 2021 ?8338  ?HGB 9.0* 10.2* 11.1* 13.4  ?HCT 29.5* 32.5* 34.7* 42.5  ?PLT 121* 155 196 142*  ?CREATININE 2.14* 2.08*  --  2.61*  ? ? ?Estimated Creatinine Clearance: 28.8 mL/min (A) (by C-G formula based on SCr of 2.61 mg/dL (H)). ? ? ?Medical History: ?Past Medical History:  ?Diagnosis Date  ? Anemia 1980s X 1  ? Arthritis   ? "hands, knees, hips, ankles" (07/31/2015)  ? Atrial flutter ([place]) 07/31/2015  ? CHF (congestive heart failure) ([place])   ? Chronic diastolic heart failure ([place])   ? a. 11/2015: Echo w/ EF of 60-65%, no WMA, Grade 2 DD, trivial AR, ascending aorta mildly dilated.   ? Depression   ? Dyspnea   ? History of cardiovascular stress test   ? a. 03/2015: Dobutamine Stress Echo with no evidence of ischemia.   ? History of gastric bypass   ? History of peptic ulcer   ? 1980's  ? History of radiation therapy 1993  ? sarcoma of left groin, tx at [facility]  ? History of sarcoma   ? 1993  LEFT GOIN--  S/P SURGERY, RADIATION AND CHEMO IN [place]  ? Hypertension   ? Hypogonadism in male   ? Incomplete right bundle branch block   ? Nerve injury   ? 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 Nocturia   ? OSA on CPAP   ? Persistent atrial fibrillation ([place])   ?    ? 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 Primary prostate  adenocarcinoma ([place]) DX 07/08/14  ? Gleason 7,  stage T1c  ? PVC's (premature ventricular contractions) 11/21/2015  ? 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 "of groin"  ? Weakness of left leg   ? SECONDARY TO SURGICAL NERVE INJURY OF LEFT GOIN  ? Wears glasses   ? ? ?Medications:  ?Medications Prior to Admission  ?Medication Sig Dispense Refill Last Dose  ? acetaminophen (TYLENOL) 500 MG tablet Take 1,000 mg by mouth every 6 (six) hours as needed for headache (pain).   Past Week  ? bumetanide (BUMEX) 2 MG tablet TAKE ONE TABLET BY MOUTH EVERY MORNING 30 tablet 6 Past Week  ? clotrimazole (LOTRIMIN) 1 % cream Apply 1 application topically daily as needed (after shower).   Past Week  ? cyanocobalamin (,VITAMIN B-12,) 1000 MCG/ML injection Inject 1,000 mcg into the muscle every 30 (thirty) days.   Past Month  ? DOCUSATE CALCIUM PO Take 250 mg by mouth 2 (two) times daily.   11/09/2021  ? escitalopram (LEXAPRO) 20 MG tablet Take 20 mg by mouth daily.   10/23/2021 at 0800  ? ferrous sulfate 325 (65 FE) MG tablet Take 325 mg by mouth every other day.   Past Week  ? JARDIANCE 10 MG TABS tablet TAKE ONE TABLET BY MOUTH EVERY  MORNING 86 tablet 4 Past Week  ? metoprolol tartrate (LOPRESSOR) 50 MG tablet TAKE ONE TABLET BY MOUTH AT BREAKFAST AND AT BEDTIME 180 tablet 2 11/03/2021 at 0800  ? Multiple Vitamins-Minerals (MULTIVITAMIN WITH MINERALS) tablet Take 1 tablet by mouth daily.   11/09/2021  ? Multiple Vitamins-Minerals (PRESERVISION AREDS 2 PO) Take 1 capsule by mouth 2 (two) times a day.   11/09/2021  ? pantoprazole (PROTONIX) 40 MG tablet Take 40 mg by mouth every morning.   10/19/2021 at 0800  ? [EXPIRED] polyethylene glycol (MIRALAX / GLYCOLAX) 17 g packet Take 34 g by mouth 2 (two) times daily for 4 days. 16 packet 0 11/09/2021  ? potassium chloride SA (KLOR-CON) 20 MEQ tablet Take 1 tablet (20 mEq total) by mouth daily. 90 tablet 3 Past Week  ? tamsulosin (FLOMAX) 0.4 MG CAPS capsule Take 0.8 mg by mouth at bedtime.    11/09/2021  ?  witch hazel-glycerin (TUCKS) pad Apply topically as needed for itching. For external hemorrhoids. Also available OTC. (Patient taking differently: Apply 1 application. topically as needed for itching. For external hemorrhoids. Also available OTC.) 40 each 12 Past Month  ? amoxicillin (AMOXIL) 500 MG capsule Take 2,000 mg by mouth See admin instructions. Take 4 capsules (2000 mg) by mouth one hour prior to dental appointments   More than a month  ? Cholecalciferol (VITAMIN D3) 125 MCG (5000 UT) TABS Take 5,000 Units by mouth in the morning.     ? ELIQUIS 5 MG TABS tablet TAKE ONE TABLET BY MOUTH AT BREAKFAST AND AT BEDTIME 180 tablet 3 11/02/2021  ? magnesium oxide (MAG-OX) 400 (241.3 Mg) MG tablet Take 400-800 mg by mouth See admin instructions. Take 2 tablets (800 mg) by mouth in the morning & take 1 tablet (400 mg) by mouth in the evening.     ? metolazone (ZAROXOLYN) 5 MG tablet Take 5 mg by mouth daily as needed (fluid).     ? Polyethyl Glycol-Propyl Glycol (SYSTANE ULTRA) 0.4-0.3 % SOLN Place 1 drop into both eyes 3 (three) times daily as needed (dry/irritated eyes).     ? Pramox-PE-Glycerin-Petrolatum (HEMORRHOIDAL) 1-0.25-14.4-15 % CREA Place 1 application rectally daily as needed (Hemorrhoids).   More than a month  ? ? ?Assessment: ?81 yo M on apixaban 5 mg po BID for Afib. Pharmacy consulted to manage heparin for bridge therapy while apixaban on hold for possible procedures. ?Apixaban 5 mg po BID> last dose 3/31 am ( PM dose held d/t BiPap & confusion) ?Hg  10.9 PTA, 13.4 today but not c/w previous labs ?PLT 133 on admit. 142 today ?SCr up to 2.61 ?No bleeding reported ? ?Goal of Therapy:  ?Heparin level 0.3-0.7 units/ml ?aPTT 66-102 seconds ?Monitor platelets by anticoagulation protocol: Yes ?  ?Plan:  ?Order baseline aPTT & heparin level (will be elevated due to apixaban) ?Start heparin drip at 1400 units/hr and check 8 hr aPTT ?Daily aPTT/heparin level & CBC ?Will adjust using aPTT until apixaban cleared  from system ? ?Eudelia Bunch, Pharm.D ?2021-11-26 10:25 AM ? ?

## 2021-12-14 NOTE — Progress Notes (Signed)
Pt was terminally extubated to 2l per doctors order.  ?

## 2021-12-14 NOTE — Death Summary Note (Signed)
?DEATH SUMMARY  ? ?Patient Details  ?Name: Eric Mayer ?MRN: [id number] ?DOB: 1941/01/03 ? ?Admission/Discharge Information  ? ?Admit Date:  11/13/21  ?Date of Death: Date of Death: November 17, 2021  ?Time of Death: Time of Death: 5277  ?Length of Stay: 4  ?Referring Physician: [name], MD  ? ?Reason(s) for Hospitalization  ?Notably gentleman with need for chronic anticoagulation with recurrent significant iron deficiency anemia and recurrent lower GI bleeding due to cecal mass.  History of prior jejunal colonic internal bypass.  Kyphoscoliosis, chronic kidney disease, and other health issues.  Felt to benefit from surgery to resect the bleeding source in the hopes of resuming anticoagulation and breaking the cycle of severe GI bleeding/anemia. ? ? ?Diagnoses  ?Preliminary cause of death:  ?Secondary Diagnoses (including complications and co-morbidities):  ?Principal Problem: ?  Mass of colon ?Active Problems: ?  Acute on chronic diastolic CHF (congestive heart failure) ([place]) ?  Chronic anticoagulation ?  Essential hypertension ?  Sleep apnea-on C-Pap ?  Persistent atrial fibrillation ([place]) ?  Chronic kidney disease, stage 3a ([place]) ?  Benign prostatic hyperplasia ?  AKI (acute kidney injury) ([place]) ?  Anxiety ?  Acute respiratory failure with hypoxia ([place]) ?  Hypotension due to hypovolemia ?  Septic shock ([place]) ? ? ?Brief Hospital Course (including significant findings, care, treatment, and services provided and events leading to death)  ?Eric Mayer is a 81 y.o. year old male who underwent robotic lyse adhesions with takedown of his jejunal colonic anastomosis en bloc with a right colectomy.  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  Postoperatively he was kept on the dry side and creatinine elevated a little bit but had some IVF & albumin boluses.  Creatinine peaked a little above 2 but was making some urine.  He did have some hallucinations on narcotics which  were switched around.  Mild hoarseness - improved.  He noted some shortness of breath.  Medicine consultation was made with dose of Lasix given.  Echocardiogram showed decent ejection fraction.  Hemoglobin without any drop nor any hematochezia to imply recurrent bleeding even placed on low-dose of enoxaparin perioperatively and switch to Eliquis on postop day 2 when he was tolerating some liquids.  Hemoglobin remained stable on Eliquis.   Patient had progressive shortness of breath and was transferred to stepdown unit.  Ccritical care was consulted.  Patient became more oliguric.  Aggressive treatment was made.  Blood pressure was soft but he had no evidence of any recurrent GI bleeding or anemia nor peritonitis.  Patient went into respiratory distress further - CCM proceeded with intubation.  Orogastric tube with some return consistent with postop ileus.  CT scan showing some air around anastomosis relatively low volume consistent with postop.  No abd distention/pain.  Developed V-fib arrest.  Recurrent.  Wife wish to switch to comfort care.  Patient expired. ? ? ? ?Pertinent Labs and Studies  ?Significant Diagnostic Studies ?CT ABDOMEN PELVIS WO CONTRAST ? ?Result Date: 11-17-21 ?CLINICAL DATA:  Peritonitis or perforation suspected. Of note, provider reports that patient had robotic extended right hemicolectomy, take down of jejunal colonic bypass with small bowel resection and cholecystectomy. EXAM: CT ABDOMEN AND PELVIS WITHOUT CONTRAST TECHNIQUE: Multidetector CT imaging of the abdomen and pelvis was performed following the standard protocol without IV contrast. RADIATION DOSE REDUCTION: This exam was performed according to the departmental dose-optimization program which includes automated exposure control, adjustment of the mA and/or kV according to patient size  and/or use of iterative reconstruction technique. COMPARISON:  None. FINDINGS: Lower chest: Large right pleural effusion with near complete  atelectatic collapse of the right lower lobe. Moderate left pleural effusion with near-complete collapse of the left lower lobe. Please see today's dedicated CT chest report for further details. Hepatobiliary: No focal liver abnormality is seen. Status post cholecystectomy. No biliary dilatation. Pancreas: Diffuse mild parenchymal atrophy. The main pancreatic duct is not dilated. No peripancreatic fluid collection. Spleen: Normal in size without focal abnormality. 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 A few bilateral punctate nonobstructive calyceal stones are noted. No hydronephrosis. Urinary bladder is decompressed by a Foley catheter. Stomach/Bowel: Enteric tube tip is within the distal gastric body. Several loops of dilated proximal small bowel are noted. Ileocolic anastomosis is seen in the anterior left abdomen. A small amount of free air is noted adjacent to the anastomosis. No adjacent free fluid. Small bowel anastomosis is seen in the anterior right abdomen. No adjacent free air or fluid. Colon is normal in caliber. Vascular/Lymphatic: Aortic atherosclerosis. No enlarged abdominal or pelvic lymph nodes. Reproductive: Brachytherapy seeds noted in the prostate. Other: Small amount of free intraperitoneal air is noted along the anterior abdominal wall. A small amount of air traverses through a defect in the left anterior abdominal wall and extends into the subcutaneous soft tissues of the left anterolateral abdominal and chest wall. Small amount of simple free fluid is seen in the right upper abdomen with additional trace free fluid within the lower pelvis dependently. Musculoskeletal: Smooth flowing anterior osteophytes throughout the thoracic and upper lumbar spine. No acute or suspicious osseous finding. Diffuse subcutaneous edema, most prominent in the left lateral chest wall and left lateral abdominal wall.  IMPRESSION: 1. Small amount of free intraperitoneal air and fluid, consistent with recent postoperative status. 2. Small amount of free intraperitoneal air adjacent to the ileocolonic anastomosis in the left anterior abdomen raises concern for possible anastomotic leak. 3. Several dilated proximal small bowel loops could reflect obstruction at the level of the small bowel anastomosis versus developing ileus. 4. Enteric tube tip is in appropriate position within the distal gastric body. 5. Bilateral pleural effusions. Please see today's dedicated CT chest report for further details. These results were called by telephone at the time of interpretation on 12-09-21 at 2:18 pm to provider [facility] ALVA MD as well as Romana Juniper MD, who verbally acknowledged these results. Electronically Signed   By: Ileana Roup M.D.   On: 09-Dec-2021 14:41  ? ?DG Abd 1 View ? ?Result Date: 2021/12/09 ?CLINICAL DATA:  Orogastric tube placement. EXAM: ABDOMEN - 1 VIEW COMPARISON:  None. FINDINGS: Limited evaluation of the abdomen demonstrates orogastric tube extending into the region of the stomach with the tip likely extending into the mid to distal stomach. No evidence of overt bowel obstruction. IMPRESSION: Orogastric tube extends into the region of the mid to distal stomach. Electronically Signed   By: Aletta Edouard M.D.   On: 09-Dec-2021 13:29  ? ?CT CHEST WO CONTRAST ? ?Addendum Date: 12-09-2021   ?ADDENDUM REPORT: 12-09-2021 14:54 ADDENDUM: The ileocolic anastomosis is identified in the anterior left abdomen. A small amount of free air is noted adjacent to the anastomosis. While there is no fluid collection in this region, the air adjacent to the anastomosis raises the possibility of an anastomotic leak in this region. The CT of the abdomen and pelvis was dictated separately and this finding will  be discussed with the clinical team by the radiologist who interpreted the CT the abdomen and pelvis. Electronically Signed   By: Dorise Bullion III M.D.   On: 11-24-21 14:54  ? ?Result Date: 2021/11/24 ?CLINICAL DATA:  Respiratory illness. Nondiagnostic x-ray. Right hemicolectomy November 10, 2021. EXAM: CT CHEST, ABDOMEN AND PELVIS WITHOUT CONTRAST TECHNIQU

## 2021-12-14 NOTE — Progress Notes (Signed)
Two RT's attempted (times two) per policy to place ordered arterial line- unsuccessful. RN aware. ?

## 2021-12-14 NOTE — Procedures (Signed)
Intubation Procedure Note ? ?Eric Mayer  ?[id number]  ?[birth year] ? ?Date:Nov 28, 2021  ?Time:1:11 PM  ? ?Provider Performing:Dnaiel Voller V. Elsworth Soho  ? ? ?Procedure: Intubation (52080) ? ?Indication(s) ?Respiratory Failure ? ?Consent ?Risks of the procedure as well as the alternatives and risks of each were explained to the patient and/or caregiver.  Consent for the procedure was obtained and is signed in the bedside chart ? ? ?Anesthesia ?Etomidate, Versed, and Fentanyl ? ? ?Time Out ?Verified patient identification, verified procedure, site/side was marked, verified correct patient position, special equipment/implants available, medications/allergies/relevant history reviewed, required imaging and test results available. ? ? ?Sterile Technique ?Usual hand hygeine, masks, and gloves were used ? ? ?Procedure Description ?Patient positioned in bed supine.  Sedation given as noted above.  Patient was intubated with endotracheal tube using Glidescope.  View was Grade 1 full glottis .  Number of attempts was  2 . Started aspirating 1st attempt. Cords not visualised, ETT withdrawn, suctioned then reintubated. Colorimetric CO2 detector was consistent with tracheal placement. ? ? ?Complications/Tolerance ?None; patient tolerated the procedure well. ?Chest X-ray is ordered to verify placement. ? ? ?EBL ?Minimal ? ? ?Specimen(s) ?None ? ? ?Eric Mayer Elsworth Soho MD ?

## 2021-12-14 NOTE — Progress Notes (Signed)
Patient was transported to CT without incident. Patient maintained saturations and volumes in transport.  RT will continue to monitor  ?

## 2021-12-14 NOTE — Progress Notes (Signed)
Evaluated at bedside approximately 2:15 PM-noted progressive significant decline throughout the day with increasing pressor requirement, need for intubation and orogastric tube placement with at least 2 L of dark output, ongoing anuria and CT of the chest abdomen and pelvis which has demonstrated a large right pleural effusion, moderate left effusion, and bilateral underlying opacity, patchy infiltrate in left upper lobe, ileus, and some pneumoperitoneum which is a relatively small volume that would be expected 4 days after robotic surgery.  A small amount of free air is noted adjacent to the ileocolic anastomosis in the left upper abdomen but no significant free fluid.  Echo yesterday with normal cardiac function but the patient does have a history of cardiac and pulmonary disease ?I reviewed the patient's operative report and both anastomoses were noted to be very well perfused confirmed by firefly and well intact without tension; the patient had a benign abdominal exam this morning and this remains the case essentially nondistended without guarding.  He does have ecchymoses spanning the left abdominal wall and flank which is not uncommon and appears stable.  However, given progressive decline my first concern is intra-abdominal complication specifically anastomotic leak.  I discussed with his wife the possibility of reexploring his abdomen to rule out a leak, versus further imaging with rectal contrast to further study the ileocolic anastomosis.  Does also have bilateral effusions and concern for pneumonia which could also be driving his shock, and possibly a cardiogenic component though his echo was reassuring as of yesterday.  While I was in the patient's room having this conversation with his wife, he went into V. tach arrest requiring chest compressions, multiple boluses of epinephrine and multiple rounds of defibrillation.  He seemed to respond to each intervention briefly but then would go back into V. tach  arrest. ?In his current state, he will not survive reoperation.  If he stabilizes significantly, we can try to do the CT with rectal contrast but given the overall clinical course at this point I think his prognosis is very poor regardless of any further intervention.  We will continue to follow.  Appreciate excellent care from the ICU team. ?

## 2021-12-14 NOTE — Progress Notes (Signed)
eLink Physician-Brief Progress Note ?Patient Name: Eric Mayer ?DOB: [birth year] ?MRN: [id number] ? ? ?Date of Service ? Nov 28, 2021  ?HPI/Events of Note ? Notified that patient lost peripheral IV access where levophed is running peripherally.  RN was able to insert another gauge 24 peripherally.  ?eICU Interventions ? Please insert central line in the morning.   ? ? ? ?Intervention Category ?Intermediate Interventions: Other: ? ?Elsie [name] ?11-28-21, 1:40 AM ?

## 2021-12-14 NOTE — Progress Notes (Signed)
? ?NAME:  Eric Mayer, MRN:  [id number], DOB:  11-16-1940, LOS: 4 ?ADMISSION DATE:  11/04/2021, CONSULTATION DATE:  11/19/2021 ? ?REFERRING MD:  Marylyn Ishihara, TRH, CHIEF COMPLAINT: Respiratory distress and hypotension ? ?History of Present Illness:  ?81 year old who underwent elective right hemicolectomy on 3/28 for bleeding cecal polyp.  He developed respiratory distress and hospitalist were consulted, diuretics was restarted with Lasix and Bumex, PCCM consulted for hypotension ?Chest x-ray showing bilateral pleural effusions, he appears to be +4.4 L with urine output of 1.4 L in the last 24 hours.  Started on empiric antibiotics cefepime and vancomycin ? ?Pertinent  Medical History  ?Chronic diastolic heart failure ?CKD stage III ?OSA ?Chronic dyspnea -felt to be multifactorial, restrictive lung disease, heart failure, obesity, RHC 08/2020 showed normal filling pressures but had elevated V waves and PCWP tracing ?PFTs 5/21: FEV1 1.66 (59%) FVC 2.28 (59%) DLCO 69% ?Chronic atrial fibrillation on Eliquis ?Morbid obesity status post gastric bypass ? ?Significant Hospital Events: ?Including procedures, antibiotic start and stop dates in addition to other pertinent events   ?3/31 worsening dyspnea and creatinine >> BiPAP , hypotension requiring peripheral Levophed ? ?Interim History / Subjective:  ? ?Critically ill, hypotensive on 12 mics of Levophed ?Maintained on BiPAP overnight ?An uric ?Objective   ?Blood pressure (!) 86/37, pulse 70, temperature 98.2 ?F (36.8 ?C), temperature source Axillary, resp. rate (!) 39, height '5\' 10"'$  (1.778 m), weight 108.7 kg, SpO2 99 %. ?   ?FiO2 (%):  [40 %] 40 %  ? ?Intake/Output Summary (Last 24 hours) at 19-Nov-2021 0923 ?Last data filed at 2021-11-19 0607 ?Gross per 24 hour  ?Intake 982.81 ml  ?Output 25 ml  ?Net 957.81 ml  ? ? ?Filed Weights  ? 11/12/21 0500 11/13/21 0438 November 19, 2021 0500  ?Weight: 106 kg 108.7 kg 108.7 kg  ? ? ?Examination: ?General: Obese man, lying in bed, on BiPAP fullface  mask, mild distress ?HENT: Mild pallor, no icterus, no JVD ?Lungs: Decreased breath sounds bilateral, no accessory muscle use ?Cardiovascular: S1-S2 distant, irregular ?Abdomen: Obese, mild tenderness at incision site, ecchymosis left flank ?Extremities: 1+ edema ?Neuro: Somnolent but opens eyes and follows one-step commands ?GU: Minimal clear urine ? ? ?Labs show creatinine rising to 2.6, mild hyperkalemia, bicarb 21, no leukocytosis, stable hemoglobin ? ?Chest x-ray independently reviewed 4/1 shows right layering effusion, right IJ central line in position ?Resolved Hospital Problem list   ? ? ?Assessment & Plan:  ?Hypotension -unclear cause , treating as septic shock  ?-cefepime/vancomycin added for bibasal pneumonia/HAP, note high procalcitonin ?-Levophed for MAP 65 and above ?-Echo shows normal LV function, grade 3 diastolic dysfunction ?-Appears dry, will give 1 L normal saline and monitor CVP ? ?Acute hypoxic respiratory failure -no hypercarbia on repeated ABG ?-Use BiPAP as needed for work of breathing ?Attributed to heart failure versus bibasilar pneumonia/effusions ?-CT chest when stable for transport ?-Right pleural effusion, may need thoracentesis at some point ? 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 ?AKI  ?-Holding Lasix ? ?Best Practice (right click and "Reselect all SmartList Selections" daily)  ? ?Diet/type: NPO w/ oral meds ?DVT prophylaxis: systemic heparin ?GI prophylaxis: PPI ?Lines: N/A ?Foley:  Yes, and it is still needed ?Code Status:  full code ?Last date of multidisciplinary goals of care discussion -wife updated, agreeable to intubation if required ? ?Labs   ?CBC: ?Recent Labs  ?Lab  11/11/21 ?5681 11/11/21 ?1449 11/12/21 ?2751 11/13/21 ?7001 11/13/21 ?1239 Nov 21, 2021 ?7494  ?WBC 11.3*  --  10.9* 4.8 6.0 10.8*  ?HGB 9.0*  9.1* 9.0* 10.2* 11.1* 13.4  ?HCT 29.8* 30.1* 29.5* 32.5* 34.7* 42.5  ?MCV 92.0  --  92.2 90.3 88.1 86.7  ?PLT 133*  --  121* 155 196 142*  ? ? ? ?Basic Metabolic Panel: ?Recent Labs  ?Lab 11/11/21 ?0358 11/12/21 ?0439 11/13/21 ?0433 11-21-2021 ?4967  ?NA 137 132* 132* 132*  ?K 3.9 3.9 3.9 5.1  ?CL 103 97* 95* 96*  ?CO2 '27 28 29 '$ 21*  ?GLUCOSE 132* 107* 128* 101*  ?BUN 27* 28* 36* 42*  ?CREATININE 1.70* 2.14* 2.08* 2.61*  ?CALCIUM 8.6* 8.8* 9.1 9.7  ?MG 2.1 2.0  --   --   ?PHOS  --  4.6  --   --   ? ? ?GFR: ?Estimated Creatinine Clearance: 28.8 mL/min (A) (by C-G formula based on SCr of 2.61 mg/dL (H)). ?Recent Labs  ?Lab 11/12/21 ?0439 11/13/21 ?0428 11/13/21 ?0433 11/13/21 ?1239 11/21/21 ?5916  ?PROCALCITON  --  2.14  --   --   --   ?WBC 10.9*  --  4.8 6.0 10.8*  ? ? ? ?Liver Function Tests: ?Recent Labs  ?Lab 11/12/21 ?0439  ?AST 24  ?ALT 19  ?ALKPHOS 58  ?BILITOT 0.3  ?PROT 4.9*  ?ALBUMIN 3.0*  ? ? ?No results for input(s): LIPASE, AMYLASE in the last 168 hours. ?No results for input(s): AMMONIA in the last 168 hours. ? ?ABG ?   ?Component Value Date/Time  ? PHART 7.36 11/13/2021 1930  ? PCO2ART 38 11/13/2021 1930  ? PO2ART 131 (H) 11/13/2021 1930  ? HCO3 21.5 11/13/2021 1930  ? TCO2 19 (L) 09/04/2020 1008  ? ACIDBASEDEF 3.6 (H) 11/13/2021 1930  ? O2SAT 98.1 11/13/2021 1930  ? ?  ? ?Coagulation Profile: ?No results for input(s): INR, PROTIME in the last 168 hours. ? ?Cardiac Enzymes: ?No results for input(s): CKTOTAL, CKMB, CKMBINDEX, TROPONINI in the last 168 hours. ? ?HbA1C: ?Hgb A1c MFr Bld  ?Date/Time Value Ref Range Status  ?11/08/2021 10:10 AM 4.8 4.8 - 5.6 % Final  ?  Comment:  ?  (NOTE) ?Pre diabetes:          5.7%-6.4% ? ?Diabetes:              >6.4% ? ?Glycemic control for   <7.0% ?adults with diabetes ?  ? ? ?CBG: ?Recent Labs  ?Lab 11/13/21 ?1542 11/13/21 ?2006 11/21/2021 ?0103 2021-11-21 ?3846 21-Nov-2021 ?0801  ?GLUCAP 118* 114* 118* 116* 102*  ? ?Critical care time: 37m?  ? ?RKara MeadMD. FShade Flood ?[facility] ?Pager : [phone] ? ?If no response to pager , please call [phone] 7 pm ?After 7:00 pm call Elink  [phone] ? ?4April 08, 2023? ? ? ? ?

## 2021-12-14 NOTE — Progress Notes (Signed)
This RN notified Kara Mead MD about patient's hypotension, poor vascular access, tachypnea, and decreased level of consciousness. Central line placed. Pressors titrated per titration instructions.At 1140, patient had 11 beat run of Vtach. EKG obtained. MD notified. EKG hand delivered to MD. Patient's BP continued to decline. MD notified. Fluid boluses given (see MAR). Patient's level of consciousness and blood pressures continued to decline. MD notified. Pt intubated at approximately 1230. At approximately 1430, this RN was at bedside with Romana Juniper, MD (surgery) and Patient went into pulseless V-tach; this RN initiated Code blue. MD Elsworth Soho at bedside. Patient continued to have recurrent v-tach. Multiple shocks given to patient. See MAR for medication details. At approximately 1710, this RN and Kara Mead, MD talked to wife. Wife verbalized understanding of patient's prognosis and wish to withdrawal care. Comfort measures placed. Patient extubated by Neomia Glass, RT. Patient had no pulses upon palpation, no heart sounds heard upon ausculation, no spontaneous breaths (2nd RN verified by Gildardo Pounds). Pt's TOD 1727.  ?

## 2021-12-14 NOTE — Progress Notes (Signed)
Patient seems to be in septic shock with BP in 80s/30s.  Increased heart rate to 30s.  On BiPAP.  Somewhat encephalopathic.  Progressive AKI with anuria.  He is full code.  He is on Levophed and broad-spectrum antibiotics.  Discussed with PCCM, Dr. Elsworth Soho who is involved.  [facility] hospitalist service signed off.  ?

## 2021-12-14 NOTE — Progress Notes (Signed)
4 Days Post-Op  ? ?Subjective/Chief Complaint: ?Events of yesterday noted with increasing tachypnea and oxygen requirement.  No improvement with diuresis, renal ultrasound unremarkable except for ascites, CT chest pending and echo shows normal ejection fraction with normal left ventricular function, grade 3 diastolic dysfunction, mildly dilated left and right atria; empirically started on antibiotics for possible H CAP and started on BiPAP, hypotension and new Levophed requirement, AKI with creatinine up to 2.6 this morning from preop baseline of 1.4 with what appears to be anuria overnight, white count up slightly to 10.8 but afebrile, no tachycardia, hemoglobin stable to slightly increased after diuresis, platelets down to 146 from 196;  ? ? ?Objective: ?Vital signs in last 24 hours: ?Temp:  [97.6 ?F (36.4 ?C)-99.3 ?F (37.4 ?C)] 98.2 ?F (36.8 ?C) (04/01 4034) ?Pulse Rate:  [66-166] 70 (04/01 0700) ?Resp:  [22-40] 39 (04/01 0700) ?BP: (65-174)/(17-133) 86/37 (04/01 0700) ?SpO2:  [95 %-100 %] 99 % (04/01 0700) ?FiO2 (%):  [40 %] 40 % (04/01 0400) ?Weight:  [108.7 kg] 108.7 kg (04/01 0500) ?Last BM Date : 11/07/21 ? ?Intake/Output from previous day: ?03/31 0701 - 04/01 0700 ?In: 982.8 [I.V.:382.9; IV Piggyback:599.9] ?Out: 25 [Urine:25] ?Intake/Output this shift: ?No intake/output data recorded. ? ?Somnolent, mildly tachypneic on BiPAP ?Abdomen is soft, distended, incisions are clean dry intact without cellulitis or hernia, minimal nonpitting bilateral lower extremity edema ? ?Lab Results:  ?Recent Labs  ?  11/13/21 ?1239 26-Nov-2021 ?7425  ?WBC 6.0 10.8*  ?HGB 11.1* 13.4  ?HCT 34.7* 42.5  ?PLT 196 142*  ? ?BMET ?Recent Labs  ?  11/13/21 ?0433 November 26, 2021 ?9563  ?NA 132* 132*  ?K 3.9 5.1  ?CL 95* 96*  ?CO2 29 21*  ?GLUCOSE 128* 101*  ?BUN 36* 42*  ?CREATININE 2.08* 2.61*  ?CALCIUM 9.1 9.7  ? ?PT/INR ?No results for input(s): LABPROT, INR in the last 72 hours. ?ABG ?Recent Labs  ?  11/13/21 ?1114 11/13/21 ?1930  ?PHART 7.44  7.36  ?HCO3 25.8 21.5  ? ? ?Studies/Results: ?US RENAL ? ?Result Date: 11/13/2021 ?CLINICAL DATA:  Acute kidney injury EXAM: RENAL / URINARY TRACT ULTRASOUND COMPLETE COMPARISON:  08/30/2020 CT abdomen pelvis FINDINGS: Evaluation is somewhat limited due to limited patient mobility and a possible loop of bowel adjacent to the left kidney. Right Kidney: Renal measurements: 9.5 x 4.8 x 4.9 cm = volume: 117 mL. Echogenicity within normal limits. No mass or hydronephrosis visualized. Calcification within the parenchyma, which was also seen on the prior CT Left Kidney: Limited visualization, possibly due to adjacent loop of bowel. Renal measurements: 8.6 x 4.6 x 3.7 = volume: 77 mL. Echogenicity within normal limits. No mass or hydronephrosis visualized. Bladder: The bladder could not be visualized due to an adjacent recent incision site. Other: Ascites. IMPRESSION: 1. Evaluation is somewhat limited by limited patient mobility and a loop of bowel adjacent to the left kidney. Within this limitation, no acute abnormality is seen in the bilateral kidneys. 2. Ascites. Electronically Signed   By: Merilyn Baba M.D.   On: 11/13/2021 19:15  ? ?DG Chest Port 1 View ? ?Result Date: 11/13/2021 ?CLINICAL DATA:  Shortness of breath. EXAM: PORTABLE CHEST 1 VIEW COMPARISON:  November 02, 2021. FINDINGS: Stable cardiomegaly. Left-sided pacemaker is unchanged in position. Increased right basilar opacity is noted concerning for worsening pneumonia or atelectasis with associated pleural effusion. Stable left basilar atelectasis is noted. Bony thorax is unremarkable. IMPRESSION: Increased right basilar opacity is noted concerning for worsening pneumonia or atelectasis with associated pleural  effusion. Electronically Signed   By: Marijo Conception M.D.   On: 11/13/2021 08:19  ? ?ECHOCARDIOGRAM COMPLETE ? ?Result Date: 11/13/2021 ?   ECHOCARDIOGRAM REPORT   Patient Name:   Eric Mayer [facility] Date of Exam: 11/13/2021 Medical Rec #:  [id number]         Height:       70.0 in Accession #:    [id number]       Weight:       239.6 lb Date of Birth:  May 20, 1941        BSA:          2.254 m? Patient Age:    81 years         BP:           94/43 mmHg Patient Gender: M                HR:           72 bpm. Exam Location:  Inpatient Procedure: 2D Echo, Cardiac Doppler and Color Doppler Indications:    CHF  History:        Patient has prior history of Echocardiogram examinations, most                 recent 05/05/2020. CHF, Arrythmias:PVC and Atrial Flutter; Risk                 Factors:Hypertension. 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  Sonographer:    Luisa Hart RDCS Referring Phys: [place]  Sonographer Comments: Technically challenging study due to limited acoustic windows. Image acquisition challenging due to patient body habitus. IMPRESSIONS  1. Left ventricular ejection fraction, by estimation, is 55 to 60%. The left ventricle has normal function. The left ventricle has no regional wall motion abnormalities. There is severe concentric left ventricular hypertrophy. Left ventricular diastolic  parameters are consistent with Grade III diastolic dysfunction (restrictive).  2. Right ventricular systolic function is normal. The right ventricular size is normal.  3. Left atrial size was moderately dilated.  4. Right atrial size was mildly dilated.  5. The mitral valve is normal in structure. Mild mitral valve regurgitation. No evidence of mitral stenosis.  6. The aortic valve is tricuspid. There is mild calcification of the aortic valve. There is mild thickening of the aortic valve. Aortic valve regurgitation is trivial. Mild aortic valve stenosis. Comparison(s): No significant change from prior study. Conclusion(s)/Recommendation(s): Otherwise normal echocardiogram, with minor abnormalities described in the report. FINDINGS  Left Ventricle: Left ventricular ejection fraction, by estimation, is 55 to 60%. The left ventricle has normal function. The left ventricle has no regional wall  motion abnormalities. The left ventricular internal cavity size was normal in size. There is  severe concentric left ventricular hypertrophy. Left ventricular diastolic parameters are consistent with Grade III diastolic dysfunction (restrictive). Right Ventricle: The right ventricular size is normal. Right vetricular wall thickness was not well visualized. Right ventricular systolic function is normal. Left Atrium: Left atrial size was moderately dilated. Right Atrium: Right atrial size was mildly dilated. Pericardium: There is no evidence of pericardial effusion. Presence of epicardial fat layer. Mitral Valve: The mitral valve is normal in structure. Mild mitral valve regurgitation. No evidence of mitral valve stenosis. Tricuspid Valve: The tricuspid valve is normal in structure. Tricuspid valve regurgitation is trivial. No evidence of tricuspid stenosis. Aortic Valve: The aortic valve is tricuspid. There is mild calcification of the aortic valve. There is mild thickening of the aortic valve. Aortic valve regurgitation is trivial.  Aortic regurgitation PHT measures 718 msec. Mild aortic stenosis is present. Aortic valve mean gradient measures 15.0 mmHg. Aortic valve peak gradient measures 27.7 mmHg. Aortic valve area, by VTI measures 3.86 cm?. Pulmonic Valve: The pulmonic valve was grossly normal. Pulmonic valve regurgitation is mild. No evidence of pulmonic stenosis. Aorta: The aortic root is normal in size and structure, the ascending aorta was not well visualized and the aortic arch was not well visualized. IAS/Shunts: The atrial septum is grossly normal. Additional Comments: A device lead is visualized. There is a small pleural effusion in both left and right lateral regions.  LEFT VENTRICLE PLAX 2D LVIDd:         2.20 cm     Diastology LVIDs:         2.30 cm     LV e' medial:    2.94 cm/s LV PW:         2.20 cm     LV E/e' medial:  27.0 LV IVS:        2.50 cm     LV e' lateral:   7.36 cm/s LVOT diam:     2.70  cm     LV E/e' lateral: 10.8 LV SV:         126 LV SV Index:   56 LVOT Area:     5.73 cm?  LV Volumes (MOD) LV vol d, MOD A2C: 49.6 ml LV vol d, MOD A4C: 54.3 ml LV vol s, MOD A2C: 24.6 ml LV vol s, MOD A4C

## 2021-12-14 NOTE — Procedures (Signed)
Central Venous Catheter Insertion Procedure Note ? ?ALDOUS HOUSEL  ?[id number]  ?July 27, 1941 ? ?Date:11-25-2021  ?Time:9:30 AM  ? ?Provider Performing:Makeisha Jentsch V. Margreat Widener  ? ?Procedure: Insertion of Non-tunneled Central Venous Catheter(36556) with US guidance (74128)  ? ?Indication(s) ?Medication administration and Difficult access ? ?Consent ?Risks of the procedure as well as the alternatives and risks of each were explained to the patient and/or caregiver.  Consent for the procedure was obtained and is signed in the bedside chart ? ?Anesthesia ?Topical only with 1% lidocaine  ? ?Timeout ?Verified patient identification, verified procedure, site/side was marked, verified correct patient position, special equipment/implants available, medications/allergies/relevant history reviewed, required imaging and test results available. ? ?Sterile Technique ?Maximal sterile technique including full sterile barrier drape, hand hygiene, sterile gown, sterile gloves, mask, hair covering, sterile ultrasound probe cover (if used). ? ?Procedure Description ?Area of catheter insertion was cleaned with chlorhexidine and draped in sterile fashion.  With real-time ultrasound guidance a central venous catheter was placed into the right internal jugular vein. Nonpulsatile blood flow and easy flushing noted in all ports.  The catheter was sutured in place and sterile dressing applied. ? ?Complications/Tolerance ?None; patient tolerated the procedure well. ?Chest X-ray is ordered to verify placement for internal jugular or subclavian cannulation.   Chest x-ray is not ordered for femoral cannulation. ? ?EBL ?Minimal ? ?Specimen(s) ?None ? ? ?Leanna Sato Elsworth Soho MD ? ?

## 2021-12-14 NOTE — Procedures (Signed)
Arterial Catheter Insertion Procedure Note ? ?Eric Mayer  ?[id number]  ?1941-08-07 ? ?Date:12-12-2021  ?Time:1:12 PM  ? ? ?Provider Performing: Leanna Sato. Elsworth Soho  ? ? ?Procedure: Insertion of Arterial Line [phone]) with US guidance (71062)  ? ?Indication(s) ?Blood pressure monitoring and/or need for frequent ABGs ? ?Consent ?Risks of the procedure as well as the alternatives and risks of each were explained to the patient and/or caregiver.  Consent for the procedure was obtained and is signed in the bedside chart ? ?Anesthesia ?None ? ? ?Time Out ?Verified patient identification, verified procedure, site/side was marked, verified correct patient position, special equipment/implants available, medications/allergies/relevant history reviewed, required imaging and test results available. ? ? ?Sterile Technique ?Maximal sterile technique including full sterile barrier drape, hand hygiene, sterile gown, sterile gloves, mask, hair covering, sterile ultrasound probe cover (if used). ? ? ?Procedure Description ?Area of catheter insertion was cleaned with chlorhexidine and draped in sterile fashion. With real-time ultrasound guidance an arterial catheter was placed into the right femoral artery.  Appropriate arterial tracings confirmed on monitor.   ? ? ?Complications/Tolerance ?None; patient tolerated the procedure well. ? ? ?EBL ?Minimal ? ? ?Specimen(s) ?None ? ? ?Leanna Sato Elsworth Soho MD ?

## 2021-12-14 DEATH — deceased

## 2022-05-14 IMAGING — DX DG CHEST 2V
2 series · 2 of 2 positions shown · non-contrast
Comparison: Chest radiograph 08/30/2020

CLINICAL DATA: Osteopenia location.

EXAM:
CHEST - 2 VIEW

[dg chest 2 view (1 of 2)]
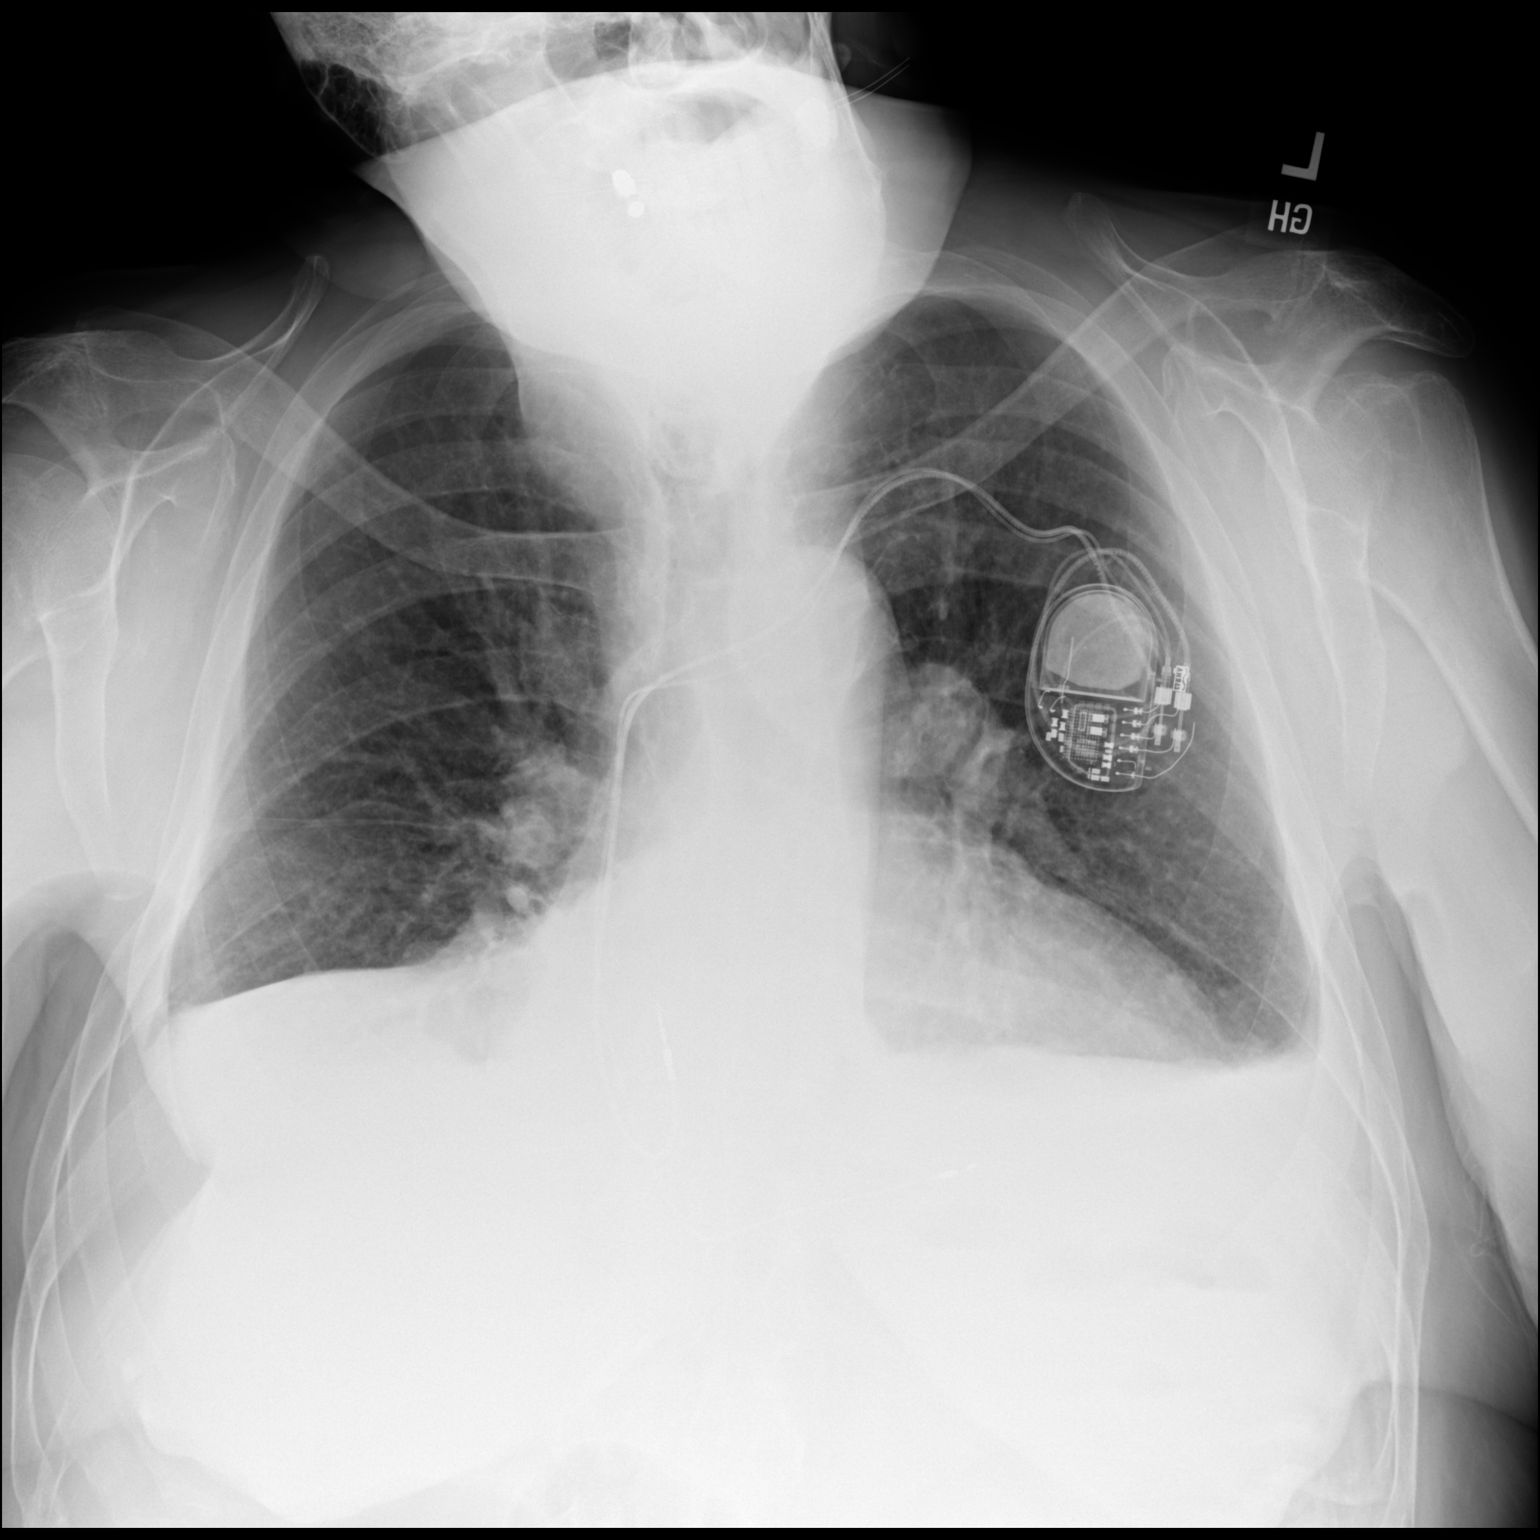

[dg chest 2 view (2 of 2)]
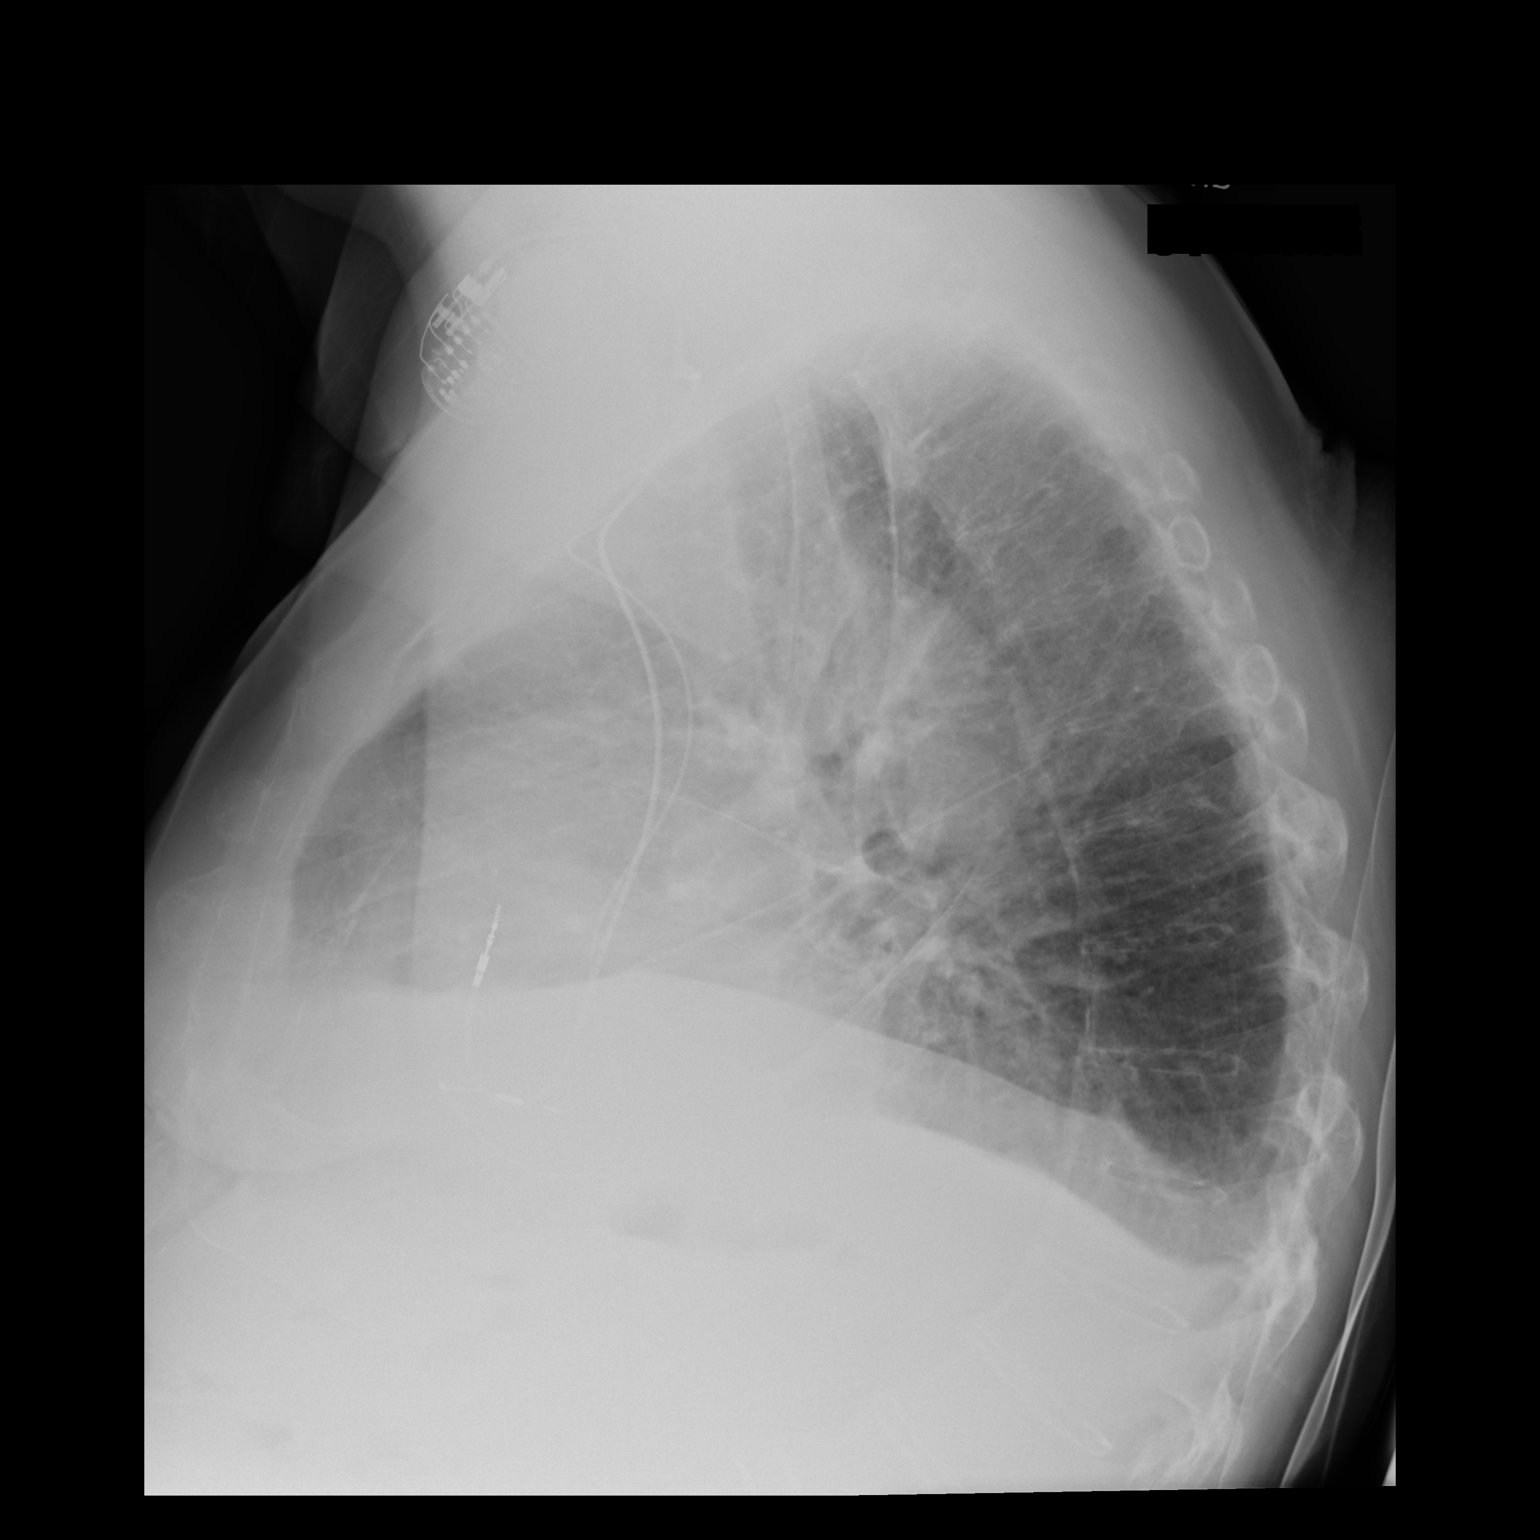

[2 of 2 positions shown; findings below may reference images not displayed]

FINDINGS: Anti lordotic positioning. Overall low lung volumes. Dual lead
left-sided pacemaker in place. Chronic cardiomegaly is similar to
prior exam. There are small bilateral pleural effusions as well as
fluid in the fissures. The apices are obscured by patient's chin.
Slight vascular congestion without pulmonary edema. No confluent
consolidation. No pneumothorax. The bones appear subjectively under
mineralized. No acute osseous abnormalities are seen
IMPRESSION: 1. Low lung volumes with chronic cardiomegaly. Small bilateral
pleural effusions. Slight vascular congestion without pulmonary
edema.
2. Subjective osteopenia/osteoporosis.
